# Patient Record
Sex: Male | Born: 1960 | Race: White | Hispanic: No | Marital: Married | Smoking: Current every day smoker
Health system: Southern US, Community
[De-identification: ages and names within clinical notes are randomized; demographics above are authoritative.]

## PROBLEM LIST (undated history)

## (undated) DIAGNOSIS — L509 Urticaria, unspecified: Secondary | ICD-10-CM

## (undated) DIAGNOSIS — R519 Headache, unspecified: Secondary | ICD-10-CM

## (undated) DIAGNOSIS — I480 Paroxysmal atrial fibrillation: Secondary | ICD-10-CM

## (undated) DIAGNOSIS — L97329 Non-pressure chronic ulcer of left ankle with unspecified severity: Secondary | ICD-10-CM

## (undated) DIAGNOSIS — T783XXA Angioneurotic edema, initial encounter: Secondary | ICD-10-CM

## (undated) DIAGNOSIS — E119 Type 2 diabetes mellitus without complications: Secondary | ICD-10-CM

## (undated) DIAGNOSIS — R51 Headache: Secondary | ICD-10-CM

## (undated) DIAGNOSIS — F32A Depression, unspecified: Secondary | ICD-10-CM

## (undated) DIAGNOSIS — E785 Hyperlipidemia, unspecified: Secondary | ICD-10-CM

## (undated) DIAGNOSIS — K219 Gastro-esophageal reflux disease without esophagitis: Secondary | ICD-10-CM

## (undated) DIAGNOSIS — G2581 Restless legs syndrome: Secondary | ICD-10-CM

## (undated) DIAGNOSIS — F419 Anxiety disorder, unspecified: Secondary | ICD-10-CM

## (undated) DIAGNOSIS — Z720 Tobacco use: Secondary | ICD-10-CM

## (undated) DIAGNOSIS — F329 Major depressive disorder, single episode, unspecified: Secondary | ICD-10-CM

## (undated) DIAGNOSIS — Z87442 Personal history of urinary calculi: Secondary | ICD-10-CM

## (undated) DIAGNOSIS — I10 Essential (primary) hypertension: Secondary | ICD-10-CM

## (undated) HISTORY — DX: Tobacco use: Z72.0

## (undated) HISTORY — DX: Paroxysmal atrial fibrillation: I48.0

## (undated) HISTORY — DX: Restless legs syndrome: G25.81

## (undated) HISTORY — DX: Urticaria, unspecified: L50.9

## (undated) HISTORY — DX: Angioneurotic edema, initial encounter: T78.3XXA

## (undated) HISTORY — DX: Hyperlipidemia, unspecified: E78.5

## (undated) HISTORY — DX: Type 2 diabetes mellitus without complications: E11.9

## (undated) HISTORY — DX: Anxiety disorder, unspecified: F41.9

## (undated) HISTORY — DX: Morbid (severe) obesity due to excess calories: E66.01

## (undated) HISTORY — DX: Essential (primary) hypertension: I10

## (undated) SURGERY — Surgical Case
Anesthesia: *Unknown

---
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
## 1992-05-30 HISTORY — PX: VASECTOMY: SHX75

## 2002-05-30 DIAGNOSIS — N200 Calculus of kidney: Secondary | ICD-10-CM | POA: Insufficient documentation

## 2002-05-30 HISTORY — PX: OTHER SURGICAL HISTORY: SHX169

## 2005-11-23 ENCOUNTER — Ambulatory Visit: Payer: Self-pay | Admitting: Family Medicine

## 2005-11-27 ENCOUNTER — Encounter (INDEPENDENT_AMBULATORY_CARE_PROVIDER_SITE_OTHER): Payer: Self-pay | Admitting: Internal Medicine

## 2005-11-27 DIAGNOSIS — E119 Type 2 diabetes mellitus without complications: Secondary | ICD-10-CM | POA: Insufficient documentation

## 2005-12-23 ENCOUNTER — Ambulatory Visit: Payer: Self-pay | Admitting: Family Medicine

## 2006-01-20 ENCOUNTER — Ambulatory Visit: Payer: Self-pay | Admitting: Family Medicine

## 2006-02-24 ENCOUNTER — Ambulatory Visit: Payer: Self-pay | Admitting: Family Medicine

## 2006-04-28 ENCOUNTER — Ambulatory Visit: Payer: Self-pay | Admitting: Family Medicine

## 2006-06-02 ENCOUNTER — Ambulatory Visit: Payer: Self-pay | Admitting: Family Medicine

## 2006-08-11 ENCOUNTER — Ambulatory Visit: Payer: Self-pay | Admitting: Family Medicine

## 2006-09-29 ENCOUNTER — Encounter (INDEPENDENT_AMBULATORY_CARE_PROVIDER_SITE_OTHER): Payer: Self-pay | Admitting: Internal Medicine

## 2006-09-29 DIAGNOSIS — F411 Generalized anxiety disorder: Secondary | ICD-10-CM | POA: Insufficient documentation

## 2006-09-29 DIAGNOSIS — I10 Essential (primary) hypertension: Secondary | ICD-10-CM | POA: Insufficient documentation

## 2006-09-29 DIAGNOSIS — Z6841 Body Mass Index (BMI) 40.0 and over, adult: Secondary | ICD-10-CM

## 2006-10-13 ENCOUNTER — Encounter (INDEPENDENT_AMBULATORY_CARE_PROVIDER_SITE_OTHER): Payer: Self-pay | Admitting: Internal Medicine

## 2006-10-13 ENCOUNTER — Ambulatory Visit: Payer: Self-pay | Admitting: Family Medicine

## 2006-10-13 DIAGNOSIS — M79609 Pain in unspecified limb: Secondary | ICD-10-CM | POA: Insufficient documentation

## 2006-10-16 LAB — CONVERTED CEMR LAB: Hgb A1c MFr Bld: 5.8 % (ref 4.6–6.1)

## 2006-12-06 ENCOUNTER — Telehealth (INDEPENDENT_AMBULATORY_CARE_PROVIDER_SITE_OTHER): Payer: Self-pay | Admitting: *Deleted

## 2006-12-15 ENCOUNTER — Ambulatory Visit: Payer: Self-pay | Admitting: Internal Medicine

## 2007-01-30 ENCOUNTER — Telehealth (INDEPENDENT_AMBULATORY_CARE_PROVIDER_SITE_OTHER): Payer: Self-pay | Admitting: *Deleted

## 2007-07-27 ENCOUNTER — Ambulatory Visit: Payer: Self-pay | Admitting: Family Medicine

## 2007-07-27 ENCOUNTER — Encounter (INDEPENDENT_AMBULATORY_CARE_PROVIDER_SITE_OTHER): Payer: Self-pay | Admitting: Internal Medicine

## 2007-07-27 DIAGNOSIS — F17200 Nicotine dependence, unspecified, uncomplicated: Secondary | ICD-10-CM | POA: Insufficient documentation

## 2007-07-31 LAB — CONVERTED CEMR LAB
BUN: 21 mg/dL
CO2: 24 meq/L
Calcium: 9.5 mg/dL
Chloride: 107 meq/L
Creatinine, Ser: 0.95 mg/dL
Creatinine, Urine: 67 mg/dL
Glucose, Bld: 98 mg/dL
Hgb A1c MFr Bld: 5.8 %
Microalb Creat Ratio: 42.1 mg/g — ABNORMAL HIGH
Microalb, Ur: 2.82 mg/dL — ABNORMAL HIGH
Potassium: 4.2 meq/L
Sodium: 142 meq/L
TSH: 1.022 u[IU]/mL

## 2007-12-28 ENCOUNTER — Ambulatory Visit: Payer: Self-pay | Admitting: Family Medicine

## 2008-01-02 ENCOUNTER — Encounter (INDEPENDENT_AMBULATORY_CARE_PROVIDER_SITE_OTHER): Payer: Self-pay | Admitting: Internal Medicine

## 2008-01-14 ENCOUNTER — Encounter (INDEPENDENT_AMBULATORY_CARE_PROVIDER_SITE_OTHER): Payer: Self-pay | Admitting: Internal Medicine

## 2008-02-27 ENCOUNTER — Encounter (INDEPENDENT_AMBULATORY_CARE_PROVIDER_SITE_OTHER): Payer: Self-pay | Admitting: Internal Medicine

## 2008-05-02 ENCOUNTER — Ambulatory Visit: Payer: Self-pay | Admitting: Internal Medicine

## 2008-07-28 ENCOUNTER — Encounter (INDEPENDENT_AMBULATORY_CARE_PROVIDER_SITE_OTHER): Payer: Self-pay | Admitting: Internal Medicine

## 2008-08-01 ENCOUNTER — Ambulatory Visit: Payer: Self-pay | Admitting: Internal Medicine

## 2008-08-15 ENCOUNTER — Ambulatory Visit: Payer: Self-pay | Admitting: Family Medicine

## 2008-09-30 ENCOUNTER — Ambulatory Visit: Payer: Self-pay | Admitting: Family Medicine

## 2008-11-11 ENCOUNTER — Encounter: Payer: Self-pay | Admitting: Family Medicine

## 2008-11-18 ENCOUNTER — Telehealth (INDEPENDENT_AMBULATORY_CARE_PROVIDER_SITE_OTHER): Payer: Self-pay | Admitting: Internal Medicine

## 2008-11-25 ENCOUNTER — Encounter: Payer: Self-pay | Admitting: Family Medicine

## 2008-11-26 ENCOUNTER — Encounter (INDEPENDENT_AMBULATORY_CARE_PROVIDER_SITE_OTHER): Payer: Self-pay | Admitting: *Deleted

## 2008-11-26 LAB — CONVERTED CEMR LAB
Glucose, Bld: 106 mg/dL
Hgb A1c MFr Bld: 6.2 %

## 2009-01-16 ENCOUNTER — Ambulatory Visit: Payer: Self-pay | Admitting: Family Medicine

## 2009-01-16 DIAGNOSIS — E1169 Type 2 diabetes mellitus with other specified complication: Secondary | ICD-10-CM | POA: Insufficient documentation

## 2009-01-16 DIAGNOSIS — E785 Hyperlipidemia, unspecified: Secondary | ICD-10-CM | POA: Insufficient documentation

## 2009-01-16 LAB — HM DIABETES FOOT EXAM

## 2009-02-16 ENCOUNTER — Telehealth: Payer: Self-pay | Admitting: Family Medicine

## 2009-04-06 ENCOUNTER — Encounter: Payer: Self-pay | Admitting: Family Medicine

## 2009-04-07 ENCOUNTER — Encounter (INDEPENDENT_AMBULATORY_CARE_PROVIDER_SITE_OTHER): Payer: Self-pay | Admitting: *Deleted

## 2009-11-05 ENCOUNTER — Ambulatory Visit: Payer: Self-pay | Admitting: Family Medicine

## 2010-04-06 ENCOUNTER — Encounter: Payer: Self-pay | Admitting: Family Medicine

## 2010-04-08 ENCOUNTER — Ambulatory Visit: Payer: Self-pay | Admitting: Family Medicine

## 2010-04-09 ENCOUNTER — Encounter (INDEPENDENT_AMBULATORY_CARE_PROVIDER_SITE_OTHER): Payer: Self-pay | Admitting: *Deleted

## 2010-04-09 LAB — CONVERTED CEMR LAB
BUN: 17 mg/dL
CO2: 24 meq/L
Chloride: 100 meq/L
GFR calc Af Amer: 121 mL/min
Sodium: 139 meq/L

## 2010-06-29 NOTE — Miscellaneous (Signed)
   Clinical Lists Changes  Observations: Added new observation of HGBA1C: 7.2 % (04/09/2010 12:29) Added new observation of BG RANDOM: 155 mg/dL (16/02/9603 54:09) Added new observation of PSA: 0.3 ng/mL (04/09/2010 12:29) Added new observation of GFRAA: 121 mL/min (04/09/2010 12:29) Added new observation of GFR: 105 mL/min (04/09/2010 12:29) Added new observation of CREATININE: 0.80 mg/dL (81/19/1478 29:56) Added new observation of BUN: 17 mg/dL (21/30/8657 84:69) Added new observation of CO2 PLSM/SER: 24 meq/L (04/09/2010 12:29) Added new observation of CL SERUM: 100 meq/L (04/09/2010 12:29) Added new observation of K SERUM: 4.0 meq/L (04/09/2010 12:29) Added new observation of NA: 139 meq/L (04/09/2010 12:29)

## 2010-06-29 NOTE — Assessment & Plan Note (Signed)
Summary: INSURANCE PPW FOR WORK/DLO   Vital Signs:  Patient profile:   50 year old male Height:      73 inches Weight:      398.13 pounds BMI:     52.72 Temp:     98 degrees F oral Pulse rate:   92 / minute Pulse rhythm:   regular BP sitting:   144 / 84  (left arm) Cuff size:   large  Vitals Entered By: Delilah Shan CMA Duncan Dull) (November 05, 2009 8:27 AM) CC: Insurance paperwork from work   History of Present Illness: 50 year old:  HTN, has been a little elevated. Taking his meds  DM: last a1c = 6.5  never really checks sugar. Has been normal as of a few weeks ago  Continues to gain weight  Chol at goal  Allergies: 1)  * Penicillin  Past History:  Past medical, surgical, family and social histories (including risk factors) reviewed, and no changes noted (except as noted below).  Past Medical History: Reviewed history from 01/16/2009 and no changes required. Anxiety Diabetes mellitus, type II Hypertension Morbid obesity Hyperlipidemia  Past Surgical History: Reviewed history from 09/29/2006 and no changes required. 2004 removal of renal stone surgically  Family History: Reviewed history from 08/01/2008 and no changes required. Father:  Mother: died at age 61---multiple CVA, leukemia, pneumonia Siblings:   .others  Social History: Reviewed history from 07/27/2007 and no changes required. Marital Status: Married Children: 3 Occupation: Chief Executive Officer  Review of Systems       REVIEW OF SYSTEMS GEN: No acute illnesses, no fever, chills, sweats. CV: No chest pain or SOB GI: No noted N or V Otherwise, pertinent positives and negatives are noted in the HPI.   Physical Exam  General:  alert, well-developed, well-nourished, and well-hydrated.  Morbidly obese. Head:  Normocephalic and atraumatic without obvious abnormalities. No apparent alopecia or balding. Ears:  no external deformities.   Nose:  no external deformity.   Lungs:   Normal respiratory effort, chest expands symmetrically. Lungs are clear to auscultation, no crackles or wheezes. Heart:  Normal rate and regular rhythm. S1 and S2 normal without gallop, murmur, click, rub or other extra sounds. Extremities:  No clubbing, cyanosis, edema, or deformity noted with normal full range of motion of all joints.   Neurologic:  alert & oriented X3 and gait normal.   Cervical Nodes:  No lymphadenopathy noted Psych:  Cognition and judgment appear intact. Alert and cooperative with normal attention span and concentration. No apparent delusions, illusions, hallucinations   Impression & Recommendations:  Problem # 1:  HYPERTENSION (ICD-401.9) Assessment Deteriorated  The following medications were removed from the medication list:    Lisinopril-hydrochlorothiazide 20-25 Mg Tabs (Lisinopril-hydrochlorothiazide) .Marland Kitchen... 1 by mouth daily His updated medication list for this problem includes:    Lisinopril-hydrochlorothiazide 20-12.5 Mg Tabs (Lisinopril-hydrochlorothiazide) .Marland Kitchen... 2 by mouth daily  BP today: 144/84 Prior BP: 160/80 (01/16/2009)  Prior 10 Yr Risk Heart Disease: Not enough information (01/16/2009)  Labs Reviewed: K+: 4.2 (07/27/2007) Creat: : 0.95 (07/27/2007)     Problem # 2:  AODM (ICD-250.00) Assessment: Improved  The following medications were removed from the medication list:    Lisinopril-hydrochlorothiazide 20-25 Mg Tabs (Lisinopril-hydrochlorothiazide) .Marland Kitchen... 1 by mouth daily His updated medication list for this problem includes:    Lisinopril-hydrochlorothiazide 20-12.5 Mg Tabs (Lisinopril-hydrochlorothiazide) .Marland Kitchen... 2 by mouth daily  Problem # 3:  HYPERLIPIDEMIA (ICD-272.4) Assessment: Improved  His updated medication list for this problem includes:  Simvastatin 40 Mg Tabs (Simvastatin) .Marland Kitchen... 1 once daily for elevated cholesterol by mouth  Problem # 4:  TOBACCO ABUSE (ICD-305.1) The patient does smoke cigarettes, and we discussed that  tobacco is harmful to one's overall health, and that likely quitting smoking would be the single best thing that they could do for their health.   Complete Medication List: 1)  Paxil 20 Mg Tabs (Paroxetine hcl) .Marland Kitchen.. 1 once daily 2)  Simvastatin 40 Mg Tabs (Simvastatin) .Marland Kitchen.. 1 once daily for elevated cholesterol by mouth 3)  Multivitamins Tabs (Multiple vitamin) .... Take 1 tablet by mouth once a day 4)  Fish Oil Oil (Fish oil) .... Take 1 tablet by mouth once a day 5)  Lisinopril-hydrochlorothiazide 20-12.5 Mg Tabs (Lisinopril-hydrochlorothiazide) .... 2 by mouth daily Prescriptions: LISINOPRIL-HYDROCHLOROTHIAZIDE 20-12.5 MG TABS (LISINOPRIL-HYDROCHLOROTHIAZIDE) 2 by mouth daily  #180 x 3   Entered and Authorized by:   Hannah Beat MD   Signed by:   Hannah Beat MD on 11/05/2009   Method used:   Print then Give to Patient   RxID:   [id number]   Current Allergies (reviewed today): * PENICILLIN

## 2010-06-29 NOTE — Assessment & Plan Note (Signed)
Summary: F/U/CLE   Vital Signs:  Patient profile:   50 year old male Height:      73 inches Weight:      402.12 pounds BMI:     53.25 Temp:     97.7 degrees F oral Pulse rate:   92 / minute Pulse rhythm:   regular BP sitting:   150 / 80  (left arm) Cuff size:   large  Vitals Entered By: Benny Lennert CMA Duncan Dull) (April 08, 2010 3:49 PM)  History of Present Illness: Chief complaint follow up   For about a month, has not been able to talk.  Sinus, ears, throat.  went through his whole family. bad congestion, cough Happpened when on vacation.  Eyes and ears ears itching and uncomfortable.  worried about prostate - prior vasectomy  Has some tinnitus.  Makes a noicse in his ears. longstanding  DM: a1c = 7.2  continues to gain weight  morbid obesity continues to worsen  BP not at goal.    Allergies: 1)  * Penicillin  Past History:  Past medical, surgical, family and social histories (including risk factors) reviewed, and no changes noted (except as noted below).  Past Medical History: Reviewed history from 01/16/2009 and no changes required. Anxiety Diabetes mellitus, type II Hypertension Morbid obesity Hyperlipidemia  Past Surgical History: Reviewed history from 09/29/2006 and no changes required. 2004 removal of renal stone surgically  Family History: Reviewed history from 08/01/2008 and no changes required. Father:  Mother: died at age 6---multiple CVA, leukemia, pneumonia Siblings:   .others  Social History: Reviewed history from 07/27/2007 and no changes required. Marital Status: Married Children: 3 Occupation: Chief Executive Officer  Review of Systems       REVIEW OF SYSTEMS GEN: Acute illness details above. CV: No chest pain or SOB GI: No noted N or V Otherwise, pertinent positives and negatives are noted in the HPI.   Physical Exam  Additional Exam:  GEN: WDWN, NAD, Non-toxic, A & O x 3 HEENT: Atraumatic,  Normocephalic. Neck supple. No masses, No LAD. Ears and Nose: No external deformity. serous fluid. CV: RRR, No M/G/R. No JVD. No thrill. No extra heart sounds. PULM: CTA B, decreased BS. no wheezes, crackles, rhonchi. No retractions. No resp. distress. No accessory muscle use. ABD: S, NT, ND, +BS. No rebound tenderness. No HSM.  EXTR: No c/c/e NEURO: Normal gait.  PSYCH: Normally interactive. Conversant. Not depressed or anxious appearing.  Calm demeanor.     Impression & Recommendations:  Problem # 1:  BRONCHITIS- ACUTE (ICD-466.0) Assessment New  His updated medication list for this problem includes:    Azithromycin 250 Mg Tabs (Azithromycin) .Marland Kitchen... 2 by  mouth today and then 1 daily for 4 days  Problem # 2:  AODM (ICD-250.00) Assessment: Unchanged  His updated medication list for this problem includes:    Lisinopril-hydrochlorothiazide 20-12.5 Mg Tabs (Lisinopril-hydrochlorothiazide) .Marland Kitchen... 2 by mouth daily  Problem # 3:  HYPERTENSION (ICD-401.9) ensure that on 40/25, proper dosing.   His updated medication list for this problem includes:    Lisinopril-hydrochlorothiazide 20-12.5 Mg Tabs (Lisinopril-hydrochlorothiazide) .Marland Kitchen... 2 by mouth daily  Problem # 4:  OBESITY, MORBID (ICD-278.01)  Problem # 5:  TOBACCO ABUSE (ICD-305.1)  The patient does smoke cigarettes, and we discussed that tobacco is harmful to one's overall health, and that likely quitting smoking would be the single best thing that they could do for their health.   Orders: Tobacco use cessation intermediate 3-10 minutes (81191)  Problem #  6:  HYPERLIPIDEMIA (ICD-272.4)  His updated medication list for this problem includes:    Simvastatin 40 Mg Tabs (Simvastatin) .Marland Kitchen... 1 once daily for elevated cholesterol by mouth  Complete Medication List: 1)  Paxil 20 Mg Tabs (Paroxetine hcl) .Marland Kitchen.. 1 once daily 2)  Simvastatin 40 Mg Tabs (Simvastatin) .Marland Kitchen.. 1 once daily for elevated cholesterol by mouth 3)  Multivitamins  Tabs (Multiple vitamin) .... Take 1 tablet by mouth once a day 4)  Fish Oil Oil (Fish oil) .... Take 1 tablet by mouth once a day 5)  Lisinopril-hydrochlorothiazide 20-12.5 Mg Tabs (Lisinopril-hydrochlorothiazide) .... 2 by mouth daily 6)  Glucosamine-chondroitin 250-200 Mg Caps (Glucosamine-chondroitin) .... One tablet daily 7)  Azithromycin 250 Mg Tabs (Azithromycin) .... 2 by  mouth today and then 1 daily for 4 days Prescriptions: SIMVASTATIN 40 MG  TABS (SIMVASTATIN) 1 once daily for elevated cholesterol by mouth  #90 x 3   Entered and Authorized by:   Hannah Beat MD   Signed by:   Hannah Beat MD on 04/08/2010   Method used:   Electronically to        [facility] [address] (retail)       [address]       [place], [place]  [address]       Ph: [phone]       Fax: [phone]   RxID:   [id number] LISINOPRIL-HYDROCHLOROTHIAZIDE 20-12.5 MG TABS (LISINOPRIL-HYDROCHLOROTHIAZIDE) 2 by mouth daily  #180 x 3   Entered and Authorized by:   Hannah Beat MD   Signed by:   Hannah Beat MD on 04/08/2010   Method used:   Electronically to        [facility] [address] (retail)       [address]       [place], [place]  [address]       Ph: [phone]       Fax: [phone]   RxID:   [id number] AZITHROMYCIN 250 MG  TABS (AZITHROMYCIN) 2 by  mouth today and then 1 daily for 4 days  #6 x 0   Entered and Authorized by:   Hannah Beat MD   Signed by:   Hannah Beat MD on 04/08/2010   Method used:   Print then Give to Patient   RxID:   [id number]    Orders Added: 1)  Est. Patient Level IV [32355] 2)  Tobacco use cessation intermediate 3-10 minutes [99406]    Current Allergies (reviewed today): * PENICILLIN

## 2010-07-21 ENCOUNTER — Encounter: Payer: Self-pay | Admitting: Family Medicine

## 2010-07-21 DIAGNOSIS — E119 Type 2 diabetes mellitus without complications: Secondary | ICD-10-CM

## 2010-07-21 DIAGNOSIS — E785 Hyperlipidemia, unspecified: Secondary | ICD-10-CM | POA: Insufficient documentation

## 2010-07-21 DIAGNOSIS — F411 Generalized anxiety disorder: Secondary | ICD-10-CM | POA: Insufficient documentation

## 2010-07-21 DIAGNOSIS — E11622 Type 2 diabetes mellitus with other skin ulcer: Secondary | ICD-10-CM | POA: Insufficient documentation

## 2010-07-21 DIAGNOSIS — E1159 Type 2 diabetes mellitus with other circulatory complications: Secondary | ICD-10-CM | POA: Insufficient documentation

## 2010-07-21 DIAGNOSIS — F419 Anxiety disorder, unspecified: Secondary | ICD-10-CM

## 2010-07-21 DIAGNOSIS — I10 Essential (primary) hypertension: Secondary | ICD-10-CM

## 2010-09-09 ENCOUNTER — Ambulatory Visit: Payer: Self-pay | Admitting: Family Medicine

## 2010-09-24 ENCOUNTER — Other Ambulatory Visit: Payer: Self-pay | Admitting: *Deleted

## 2010-09-24 MED ORDER — PAROXETINE HCL 20 MG PO TABS: 20.0000 mg | ORAL_TABLET | ORAL | Status: DC

## 2010-09-30 ENCOUNTER — Ambulatory Visit: Payer: Self-pay | Admitting: Family Medicine

## 2010-10-12 ENCOUNTER — Telehealth: Payer: Self-pay | Admitting: *Deleted

## 2010-10-12 NOTE — Telephone Encounter (Signed)
Pt has follow up appt with you for 5/24.  He is asking for order to have labs prior, he works at [facility] and will pick up order when ready.

## 2010-10-13 NOTE — Telephone Encounter (Signed)
done

## 2010-10-21 ENCOUNTER — Ambulatory Visit (INDEPENDENT_AMBULATORY_CARE_PROVIDER_SITE_OTHER): Payer: BC Managed Care – PPO | Admitting: Family Medicine

## 2010-10-21 ENCOUNTER — Encounter: Payer: Self-pay | Admitting: Family Medicine

## 2010-10-21 VITALS — BP 120/84 | HR 103 | Temp 98.1°F | Ht 72.0 in | Wt 359.0 lb

## 2010-10-21 DIAGNOSIS — F17200 Nicotine dependence, unspecified, uncomplicated: Secondary | ICD-10-CM

## 2010-10-21 DIAGNOSIS — I10 Essential (primary) hypertension: Secondary | ICD-10-CM

## 2010-10-21 DIAGNOSIS — E119 Type 2 diabetes mellitus without complications: Secondary | ICD-10-CM

## 2010-10-21 DIAGNOSIS — E785 Hyperlipidemia, unspecified: Secondary | ICD-10-CM

## 2010-10-21 MED ORDER — METFORMIN HCL ER 500 MG PO TB24: ORAL_TABLET | ORAL | Status: DC

## 2010-10-21 NOTE — Patient Instructions (Signed)
Metformin Titration:  1 po each morning for 2 weeks Then 2 tabs each morning for 2 weeks Then 3 tabs each morning   Occaisionally, this can upset the stomach or cause nausea. Almost universally, this will go away over time within a few weeks. Each time you go up in the dose, that can happen.  Check your blood sugar twice a day: write down the numbers Fasting each morning Then once a day, 2 hours after eating  Recheck with me in 1 month  BRING IN THE BLOOD SUGAR NUMBERS

## 2010-10-21 NOTE — Progress Notes (Signed)
50 year old male: Lost 50 pounds.  DM: rapidly deteriorating from effectively no diabetes, though he carried that diagnosis, previously he had an A1c that was within normal limits. On his most recent check, the patient had a hemoglobin A1c that was 12. His fasting blood sugar was greater than 370. He has been urinating approximately every 30 minutes. He has also had bouts of nausea over the last 1-2 months. Denies any blurred vision. Generally, and overall, he has not felt well. He has a 50 pound weight loss. He has been trying to eat well, and has discussed his diabetes with his wife, who is an Char[employer] fundraiser.  HTN: better compliance with medication, and 50 pound weight loss. No side effects.  Lipids: LDL is less than 30. Total cholesterol is at goal as well. Triglycerides are elevated greater than 300. Tolerating statin currently.  Was sick with a stomach bug Eating about half eating out and spacing out to about six meals a day. If not hungry, he has not been eating.   He is down to 350 pounds, less than greater than 400.  He continues to smoke, down to half a pack per or so a day, which is decreased down to and from 2-1/2 packs a day a few years ago  Patient Active Problem List  Diagnoses  . HYPERLIPIDEMIA  . OBESITY, MORBID  . TOBACCO ABUSE  . HYPERTENSION  . CALCULUS, KIDNEY  . Anxiety  . Diabetes mellitus type II   Past Medical History  Diagnosis Date  . Anxiety   . Diabetes mellitus type II   . HTN (hypertension)   . Morbid obesity   . HLD (hyperlipidemia)   . Tobacco abuse    Past Surgical History  Procedure Date  . Kidney stone removal 2004   History  Substance Use Topics  . Smoking status: Current Everyday Smoker -- 1.0 packs/day for 75 years    Types: Cigarettes  . Smokeless tobacco: Not on file  . Alcohol Use: Not on file   Family History  Problem Relation Age of Onset  . Stroke Mother     multiple  . Leukemia Mother   . Pneumonia Mother    Allergies  Allergen  Reactions  . Penicillins     REACTION: unspecified   Current Outpatient Prescriptions on File Prior to Visit  Medication Sig Dispense Refill  . fish oil-omega-3 fatty acids 1000 MG capsule Take 1 g by mouth daily.        Marland Kitchen glucosamine-chondroitin 500-400 MG tablet Take 1 tablet by mouth daily.        Marland Kitchen lisinopril-hydrochlorothiazide (PRINZIDE,ZESTORETIC) 20-12.5 MG per tablet Take 2 tablets by mouth daily.        . Multiple Vitamin (MULTIVITAMIN) tablet Take 1 tablet by mouth daily.        Marland Kitchen PARoxetine (PAXIL) 20 MG tablet Take 1 tablet (20 mg total) by mouth every morning.  30 tablet  6  . simvastatin (ZOCOR) 40 MG tablet Take 40 mg by mouth at bedtime.         ROS: GEN: no fevers, chills. GI: as above Pulm: No SOB Sad about DM  Interactive and getting along well at home.  Otherwise, ROS is as per the HPI.   Physical Exam  Blood pressure 120/84, pulse 103, temperature 98.1 F (36.7 C), temperature source Oral, height 6' (1.829 m), weight 359 lb (162.841 kg), SpO2 97.00%.  GEN: WDWN, NAD, Non-toxic, A & O x 3 HEENT: Atraumatic, Normocephalic. Neck supple.  No masses, No LAD. Ears and Nose: No external deformity. CV: RRR, No M/G/R. No JVD. No thrill. No extra heart sounds. PULM: CTA B, no wheezes, crackles, rhonchi. No retractions. No resp. distress. No accessory muscle use. ABD: S, NT, ND, +BS. No rebound tenderness. No HSM.  EXTR: No c/c/e NEURO Normal gait.  PSYCH: Normally interactive. Conversant. Not depressed or anxious appearing.  Calm demeanor.

## 2010-10-22 ENCOUNTER — Encounter: Payer: Self-pay | Admitting: Family Medicine

## 2010-10-27 ENCOUNTER — Encounter: Payer: Self-pay | Admitting: Family Medicine

## 2010-10-28 ENCOUNTER — Ambulatory Visit: Payer: Self-pay | Admitting: Family Medicine

## 2010-11-05 ENCOUNTER — Other Ambulatory Visit: Payer: Self-pay | Admitting: *Deleted

## 2010-11-05 MED ORDER — GLUCOSE BLOOD VI STRP: ORAL_STRIP | Status: DC

## 2010-11-16 ENCOUNTER — Other Ambulatory Visit: Payer: Self-pay | Admitting: Family Medicine

## 2010-11-25 ENCOUNTER — Encounter: Payer: Self-pay | Admitting: Family Medicine

## 2010-11-25 ENCOUNTER — Ambulatory Visit (INDEPENDENT_AMBULATORY_CARE_PROVIDER_SITE_OTHER): Payer: BC Managed Care – PPO | Admitting: Family Medicine

## 2010-11-25 VITALS — BP 120/70 | HR 86 | Temp 98.1°F | Ht 72.0 in | Wt 358.8 lb

## 2010-11-25 DIAGNOSIS — Z23 Encounter for immunization: Secondary | ICD-10-CM

## 2010-11-25 DIAGNOSIS — F17200 Nicotine dependence, unspecified, uncomplicated: Secondary | ICD-10-CM

## 2010-11-25 DIAGNOSIS — E119 Type 2 diabetes mellitus without complications: Secondary | ICD-10-CM

## 2010-11-25 DIAGNOSIS — I10 Essential (primary) hypertension: Secondary | ICD-10-CM

## 2010-11-25 DIAGNOSIS — E785 Hyperlipidemia, unspecified: Secondary | ICD-10-CM

## 2010-11-25 MED ORDER — METFORMIN HCL ER 500 MG PO TB24: 2000.0000 mg | ORAL_TABLET | Freq: Every day | ORAL | Status: DC

## 2010-11-25 NOTE — Progress Notes (Signed)
[name] [name], a 50 y.o. male presents today in the office for the following:   DM: blood sugars are trending downward, still in the high 100s at this point. No side effects from the medication. He feels dramatically better. No urination, nausea. He is checking his blood sugars at home.  HTN at goal: tolerating all medications well  Feels better than has for the last six months.  Tol metformin  Watchful of what he is eating.   Eye MD: [place], a year Pneumovax: do today  Hyperlipidemia: All laboratory numbers reviewed with the patient. From an LDL standpoint, he is at goal. He does have some high triglycerides.  Tobacco abuse, dramatically decreased compared to years prior, now only smoking [place]el cigarettes a day.  Morbid obesity, 50 pound weight loss, but the patient still remains 350 pounds.  Patient Active Problem List  Diagnoses  . HYPERLIPIDEMIA  . OBESITY, MORBID  . TOBACCO ABUSE  . HYPERTENSION  . CALCULUS, KIDNEY  . Anxiety  . Diabetes mellitus type II   Past Medical History  Diagnosis Date  . Anxiety   . Diabetes mellitus type II   . HTN (hypertension)   . Morbid obesity   . HLD (hyperlipidemia)   . Tobacco abuse    Past Surgical History  Procedure Date  . Kidney stone removal 2004   History  Substance Use Topics  . Smoking status: Current Everyday Smoker -- 1.0 packs/day for 75 years    Types: Cigarettes  . Smokeless tobacco: Not on file  . Alcohol Use: Not on file   Family History  Problem Relation Age of Onset  . Stroke Mother     multiple  . Leukemia Mother   . Pneumonia Mother    Allergies  Allergen Reactions  . Penicillins     REACTION: unspecified   Current Outpatient Prescriptions on File Prior to Visit  Medication Sig Dispense Refill  . aspirin 81 MG tablet Take 81 mg by mouth daily.        . fish oil-omega-3 fatty acids 1000 MG capsule Take 1 g by mouth daily.        Marland Kitchen glucosamine-chondroitin 500-400 MG tablet Take 1 tablet by  mouth daily.        Marland Kitchen glucose blood (TRUETEST TEST) test strip Use as instructed  100 each  12  . lisinopril-hydrochlorothiazide (PRINZIDE,ZESTORETIC) 20-12.5 MG per tablet TAKE 2 TABLETS BY MOUTH ONCE A DAY  60 tablet  1  . Multiple Vitamin (MULTIVITAMIN) tablet Take 1 tablet by mouth daily.        Marland Kitchen PARoxetine (PAXIL) 20 MG tablet Take 1 tablet (20 mg total) by mouth every morning.  30 tablet  6  . simvastatin (ZOCOR) 40 MG tablet Take 40 mg by mouth at bedtime.          ROS: GEN: No acute illnesses, no fevers, chills. GI: No n/v/d, eating normally Pulm: No SOB Interactive and getting along well at home.  Otherwise, ROS is as per the HPI.   Physical Exam  Blood pressure 120/70, pulse 86, temperature 98.1 F (36.7 C), temperature source Oral, height 6' (1.829 m), weight 358 lb 12.8 oz (162.751 kg), SpO2 98.00%.  GEN: WDWN, NAD, Non-toxic, A & O x 3 HEENT: Atraumatic, Normocephalic. Neck supple. No masses, No LAD. Ears and Nose: No external deformity. CV: RRR, No M/G/R. No JVD. No thrill. No extra heart sounds. PULM: CTA B, no wheezes, crackles, rhonchi. No retractions. No resp. distress. No accessory muscle use.  EXTR: No c/c/e NEURO Normal gait.  PSYCH: Normally interactive. Conversant. Not depressed or anxious appearing.  Calm demeanor.

## 2010-11-25 NOTE — Patient Instructions (Signed)
REFERRAL: GO THE THE FRONT ROOM AT THE ENTRANCE OF OUR CLINIC, NEAR CHECK IN. ASK FOR MARION. SHE WILL HELP YOU SET UP YOUR REFERRAL. DATE: TIME:  INCREASE YOUR METFORMIN TO 4 TABLETS BEFORE BREAKFAST  F/u 2 months

## 2011-01-14 ENCOUNTER — Other Ambulatory Visit: Payer: Self-pay | Admitting: Family Medicine

## 2011-01-19 ENCOUNTER — Ambulatory Visit: Payer: BC Managed Care – PPO | Admitting: Family Medicine

## 2011-02-08 ENCOUNTER — Ambulatory Visit: Payer: BC Managed Care – PPO | Admitting: Family Medicine

## 2011-03-15 ENCOUNTER — Other Ambulatory Visit: Payer: Self-pay | Admitting: Family Medicine

## 2011-04-14 ENCOUNTER — Other Ambulatory Visit: Payer: Self-pay | Admitting: Family Medicine

## 2011-05-14 ENCOUNTER — Other Ambulatory Visit: Payer: Self-pay | Admitting: Family Medicine

## 2011-07-12 ENCOUNTER — Other Ambulatory Visit: Payer: Self-pay | Admitting: Family Medicine

## 2011-08-25 ENCOUNTER — Encounter: Payer: Self-pay | Admitting: Family Medicine

## 2011-09-13 ENCOUNTER — Other Ambulatory Visit: Payer: Self-pay | Admitting: Family Medicine

## 2011-10-14 ENCOUNTER — Other Ambulatory Visit: Payer: Self-pay | Admitting: Family Medicine

## 2011-11-12 ENCOUNTER — Other Ambulatory Visit: Payer: Self-pay | Admitting: Family Medicine

## 2011-12-06 ENCOUNTER — Other Ambulatory Visit: Payer: Self-pay | Admitting: *Deleted

## 2011-12-06 ENCOUNTER — Telehealth: Payer: Self-pay | Admitting: *Deleted

## 2011-12-06 MED ORDER — PAROXETINE HCL 20 MG PO TABS: 20.0000 mg | ORAL_TABLET | ORAL | Status: DC

## 2011-12-06 MED ORDER — SIMVASTATIN 40 MG PO TABS: 40.0000 mg | ORAL_TABLET | Freq: Every day | ORAL | Status: DC

## 2011-12-06 MED ORDER — LISINOPRIL-HYDROCHLOROTHIAZIDE 20-12.5 MG PO TABS: ORAL_TABLET | ORAL | Status: DC

## 2011-12-06 MED ORDER — METFORMIN HCL ER 500 MG PO TB24: ORAL_TABLET | ORAL | Status: DC

## 2011-12-06 NOTE — Telephone Encounter (Signed)
Patient advised 90 supply given but, no further refills until appt for physical

## 2012-03-07 ENCOUNTER — Ambulatory Visit (INDEPENDENT_AMBULATORY_CARE_PROVIDER_SITE_OTHER): Payer: 59 | Admitting: Family Medicine

## 2012-03-07 ENCOUNTER — Encounter: Payer: Self-pay | Admitting: Family Medicine

## 2012-03-07 VITALS — BP 148/78 | HR 92 | Temp 98.3°F | Wt 375.5 lb

## 2012-03-07 DIAGNOSIS — R1909 Other intra-abdominal and pelvic swelling, mass and lump: Secondary | ICD-10-CM

## 2012-03-07 DIAGNOSIS — R1900 Intra-abdominal and pelvic swelling, mass and lump, unspecified site: Secondary | ICD-10-CM

## 2012-03-07 DIAGNOSIS — M19049 Primary osteoarthritis, unspecified hand: Secondary | ICD-10-CM

## 2012-03-07 DIAGNOSIS — E119 Type 2 diabetes mellitus without complications: Secondary | ICD-10-CM

## 2012-03-07 DIAGNOSIS — N644 Mastodynia: Secondary | ICD-10-CM

## 2012-03-07 MED ORDER — LISINOPRIL-HYDROCHLOROTHIAZIDE 20-12.5 MG PO TABS: ORAL_TABLET | ORAL | Status: DC

## 2012-03-07 MED ORDER — PAROXETINE HCL 20 MG PO TABS: 20.0000 mg | ORAL_TABLET | ORAL | Status: DC

## 2012-03-07 MED ORDER — METFORMIN HCL ER 500 MG PO TB24: ORAL_TABLET | ORAL | Status: DC

## 2012-03-07 MED ORDER — GLIPIZIDE ER 5 MG PO TB24: 5.0000 mg | ORAL_TABLET | Freq: Every day | ORAL | Status: DC

## 2012-03-07 MED ORDER — SIMVASTATIN 40 MG PO TABS: 40.0000 mg | ORAL_TABLET | Freq: Every day | ORAL | Status: DC

## 2012-03-07 NOTE — Progress Notes (Signed)
Nature conservation officer at [facility] [address] [place] [address] Phone: [phone] Fax: [phone]  Date:  03/07/2012   Name:  [name] [name]   DOB:  1960/08/14   MRN:  [id number] Gender: male Age: 51 y.o.  PCP:  Hannah Beat, MD    Chief Complaint: Medication Refill   History of Present Illness:  [name] [name] is a 51 y.o. pleasant patient who presents with the following:  Cut down to 1 cigarrette.   DM: CBG 242 and fasting. a1c 8.6 [facility]. 4 tablets of metformin.  Some weight loss - down to 375  Left 5th DIP joint swells and hurts. Taking glucosamine and chondroitin  Glucometer and test strips  All meds with 90 day cycle with [facility]  Something in groin. Has been there several months. Thought maybe a pmple, but it is inside. Not really all that painful, but he can feel it  Left chest. Last 3-4 week an area that he can feel inferior to nipple, some pain with palpation. No fhx of breast cancer.  Patient Active Problem List  Diagnosis  . HYPERLIPIDEMIA  . OBESITY, MORBID  . TOBACCO ABUSE  . HYPERTENSION  . CALCULUS, KIDNEY  . Anxiety  . Diabetes mellitus type II    Past Medical History  Diagnosis Date  . Anxiety   . Diabetes mellitus type II   . HTN (hypertension)   . Morbid obesity   . HLD (hyperlipidemia)   . Tobacco abuse     Past Surgical History  Procedure Date  . Kidney stone removal 2004    History  Substance Use Topics  . Smoking status: Current Every Day Smoker -- 75 years    Types: Cigarettes  . Smokeless tobacco: Never Used   Comment: one cigarette daily  . Alcohol Use: Yes     rarely    Family History  Problem Relation Age of Onset  . Stroke Mother     multiple  . Leukemia Mother   . Pneumonia Mother     Allergies  Allergen Reactions  . Penicillins     REACTION: unspecified    Medication list has been reviewed and updated.  Outpatient Prescriptions Prior to Visit  Medication Sig Dispense  Refill  . aspirin 81 MG tablet Take 81 mg by mouth daily.        . fish oil-omega-3 fatty acids 1000 MG capsule Take 1 g by mouth daily.        Marland Kitchen glucosamine-chondroitin 500-400 MG tablet Take 1 tablet by mouth daily.        Marland Kitchen lisinopril-hydrochlorothiazide (PRINZIDE,ZESTORETIC) 20-12.5 MG per tablet TAKE 2 TABLETS BY MOUTH ONCE A DAY  180 tablet  0  . metFORMIN (GLUCOPHAGE-XR) 500 MG 24 hr tablet TAKE 4 TABLETS BY MOUTH DAILY WITH BREAKFAST  360 tablet  0  . Multiple Vitamin (MULTIVITAMIN) tablet Take 1 tablet by mouth daily.        Marland Kitchen PARoxetine (PAXIL) 20 MG tablet Take 1 tablet (20 mg total) by mouth every morning.  90 tablet  0  . simvastatin (ZOCOR) 40 MG tablet Take 1 tablet (40 mg total) by mouth at bedtime.  90 tablet  0    Review of Systems:  No fever, chills, sweats, depression. Decreased tobacco.  Physical Examination: Filed Vitals:   03/07/12 0820  BP: 148/78  Pulse: 92  Temp: 98.3 F (36.8 C)   Filed Vitals:   03/07/12 0820  Weight: 375 lb 8 oz (170.326 kg)  There is no height on file to calculate BMI. Ideal Body Weight:     GEN: WDWN, NAD, Non-toxic, A & O x 3 HEENT: Atraumatic, Normocephalic. Neck supple. No masses, No LAD. Ears and Nose: No external deformity. CV: RRR, No M/G/R. No JVD. No thrill. No extra heart sounds. PULM: CTA B, no wheezes, crackles, rhonchi. No retractions. No resp. distress. No accessory muscle use. GU: superficial mass felt on L lower groin, does not bulge with valsalva Breast: Left breast with a discreet area that is mildly tender to palpation and freely moveable. EXTR: No c/c/e NEURO Normal gait.  PSYCH: Normally interactive. Conversant. Not depressed or anxious appearing.  Calm demeanor.    Assessment and Plan:  1. Diabetes mellitus, type 2 : add glipizide, recheck 3 mo, glucometer written  2. Breast pain, left : reassured, alleve bid for 3-4 weeks, if continues can reexamine vs image  3. 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 Groin mass : in soft tissue, not c/w hernia    Orders Today:  No orders of the defined types were placed in this encounter.    Updated Medication List: (Includes new medications, updates to list, dose adjustments) Meds ordered this encounter  Medications  . vitamin C (ASCORBIC ACID) 500 MG tablet    Sig: Take 500 mg by mouth daily.  Marland Kitchen ECHINACEA PO    Sig: Take by mouth daily.  Marland Kitchen lisinopril-hydrochlorothiazide (PRINZIDE,ZESTORETIC) 20-12.5 MG per tablet    Sig: TAKE 2 TABLETS BY MOUTH ONCE A DAY    Dispense:  180 tablet    Refill:  3  . metFORMIN (GLUCOPHAGE-XR) 500 MG 24 hr tablet    Sig: TAKE 4 TABLETS BY MOUTH DAILY WITH BREAKFAST    Dispense:  360 tablet    Refill:  3  . PARoxetine (PAXIL) 20 MG tablet    Sig: Take 1 tablet (20 mg total) by mouth every morning.    Dispense:  90 tablet    Refill:  3  . simvastatin (ZOCOR) 40 MG tablet    Sig: Take 1 tablet (40 mg total) by mouth at bedtime.    Dispense:  90 tablet    Refill:  3  . glipiZIDE (GLUCOTROL XL) 5 MG 24 hr tablet    Sig: Take 1 tablet (5 mg total) by mouth daily.    Dispense:  90 tablet    Refill:  3    Medications Discontinued: Medications Discontinued During This Encounter  Medication Reason  . lisinopril-hydrochlorothiazide (PRINZIDE,ZESTORETIC) 20-12.5 MG per tablet Reorder  . metFORMIN (GLUCOPHAGE-XR) 500 MG 24 hr tablet Reorder  . PARoxetine (PAXIL) 20 MG tablet Reorder  . simvastatin (ZOCOR) 40 MG tablet Reorder     Hannah Beat, MD

## 2012-03-07 NOTE — Patient Instructions (Addendum)
F/u 3 months 

## 2012-03-09 ENCOUNTER — Other Ambulatory Visit: Payer: Self-pay | Admitting: *Deleted

## 2012-03-09 MED ORDER — GLUCOSE BLOOD VI STRP: ORAL_STRIP | Status: DC

## 2012-03-09 MED ORDER — BAYER MICROLET LANCETS MISC: Status: DC

## 2012-03-09 NOTE — Telephone Encounter (Signed)
Pt called for mail order rx of his test strips and lancets. He has already purchased his glucometer and needed 90 day rx for specific brand strips and lancets

## 2012-03-20 ENCOUNTER — Telehealth: Payer: Self-pay | Admitting: *Deleted

## 2012-03-20 NOTE — Telephone Encounter (Signed)
Patient calling asking that we call [facility] and request prior auth for his BAYER CONTOUR TEST STRIPS. I asked pt how many times per day he tests and he stated,  because his blood sugar is not under control he tests maybe 6 times daily. I advised that because he's not insulin dependent most insurances will only pay for once daily testing. Pt still want prior auth done and asks for twice daily testing. Please advise.

## 2012-03-21 NOTE — Telephone Encounter (Signed)
Please do so.   He is checking his BS too much. Twice a day with one fasting and one time a day 2 hours after eating is more than enough.   Happy to write for strips

## 2012-03-23 ENCOUNTER — Telehealth: Payer: Self-pay

## 2012-03-23 NOTE — Telephone Encounter (Signed)
Patient notified as instructed by telephone. Pt requested [facility] to fax PA form additional questions.

## 2012-03-23 NOTE — Telephone Encounter (Signed)
Pt had spoken with [facility] on status of diabetic test strips; pt said [facility]  refaxed the PA form it was not complete. Do not find PA. Pt will have [facility] refax.

## 2012-03-26 NOTE — Telephone Encounter (Signed)
Completed PA form faxed to [phone].

## 2012-03-26 NOTE — Telephone Encounter (Addendum)
Additional questions from PA received; pt stated has tried accuchek,glucocard and one touch; meters were not accurate for him; had no confidence in readings; pt is not on insulin pump. PA form in Dr Copland's in box.Pt request call back [phone] when completed form is faxed.

## 2012-03-27 NOTE — Telephone Encounter (Signed)
Received an approval letter from [facility] for pt's test strips. Test strips are approved through 03/26/2022

## 2012-06-06 ENCOUNTER — Ambulatory Visit: Payer: 59 | Admitting: Family Medicine

## 2012-06-21 ENCOUNTER — Ambulatory Visit: Payer: 59 | Admitting: Family Medicine

## 2012-06-21 DIAGNOSIS — Z0289 Encounter for other administrative examinations: Secondary | ICD-10-CM

## 2012-07-28 ENCOUNTER — Other Ambulatory Visit: Payer: Self-pay | Admitting: Family Medicine

## 2012-07-30 NOTE — Telephone Encounter (Signed)
[facility] request refill Bayer contour next test strip #200 x 1.

## 2012-10-08 ENCOUNTER — Other Ambulatory Visit: Payer: Self-pay | Admitting: Family Medicine

## 2012-11-09 ENCOUNTER — Telehealth: Payer: Self-pay | Admitting: Family Medicine

## 2012-11-09 MED ORDER — NONFORMULARY OR COMPOUNDED ITEM: Status: DC

## 2012-11-09 NOTE — Telephone Encounter (Signed)
done

## 2012-11-09 NOTE — Telephone Encounter (Signed)
Pt scheduled appointment for next week, diabetes check and is having leg issues.  He would like to get blood work drawn at [facility] before appointment.   If you would put the order into epic, I will print and mail external orders to [place] today.  Thank you. / lt

## 2012-11-14 ENCOUNTER — Ambulatory Visit: Payer: 59 | Admitting: Family Medicine

## 2012-11-15 ENCOUNTER — Other Ambulatory Visit: Payer: Self-pay | Admitting: Family Medicine

## 2012-11-16 LAB — CBC WITH DIFFERENTIAL
Basos: 1 % (ref 0–3)
Eosinophils Absolute: 0.2 10*3/uL (ref 0.0–0.4)
HCT: 43.8 % (ref 37.5–51.0)
Hemoglobin: 15.9 g/dL (ref 12.6–17.7)
Lymphs: 25 % (ref 14–46)
MCH: 30.1 pg (ref 26.6–33.0)
MCHC: 36.3 g/dL — ABNORMAL HIGH (ref 31.5–35.7)
Neutrophils Absolute: 5.3 10*3/uL (ref 1.4–7.0)

## 2012-11-16 LAB — COMPREHENSIVE METABOLIC PANEL
ALT: 26 IU/L (ref 0–44)
Albumin/Globulin Ratio: 2.1 (ref 1.1–2.5)
Albumin: 4.2 g/dL (ref 3.5–5.5)
Creatinine, Ser: 0.92 mg/dL (ref 0.76–1.27)
GFR calc Af Amer: 111 mL/min/{1.73_m2} (ref >59)
GFR calc non Af Amer: 96 mL/min/{1.73_m2} (ref >59)
Globulin, Total: 2 g/dL (ref 1.5–4.5)
Glucose: 175 mg/dL — ABNORMAL HIGH (ref 65–99)
Potassium: 4.2 mmol/L (ref 3.5–5.2)
Total Bilirubin: 0.4 mg/dL (ref 0.0–1.2)
Total Protein: 6.2 g/dL (ref 6.0–8.5)

## 2012-11-16 LAB — PSA: PSA: 0.2 ng/mL (ref 0.0–4.0)

## 2012-11-16 LAB — HEPATIC FUNCTION PANEL: Bilirubin, Direct: 0.13 mg/dL (ref 0.00–0.40)

## 2012-11-16 LAB — LIPID PANEL W/O CHOL/HDL RATIO
LDL Calculated: 80 mg/dL (ref 0–99)
VLDL Cholesterol Cal: 26 mg/dL (ref 5–40)

## 2012-11-16 LAB — HGB A1C W/O EAG: Hgb A1c MFr Bld: 7.6 % — ABNORMAL HIGH (ref 4.8–5.6)

## 2012-11-19 ENCOUNTER — Ambulatory Visit (INDEPENDENT_AMBULATORY_CARE_PROVIDER_SITE_OTHER): Payer: 59 | Admitting: Family Medicine

## 2012-11-19 ENCOUNTER — Encounter: Payer: Self-pay | Admitting: Family Medicine

## 2012-11-19 VITALS — BP 130/80 | HR 110 | Temp 98.2°F | Ht 72.0 in | Wt 376.0 lb

## 2012-11-19 DIAGNOSIS — F17200 Nicotine dependence, unspecified, uncomplicated: Secondary | ICD-10-CM

## 2012-11-19 DIAGNOSIS — E119 Type 2 diabetes mellitus without complications: Secondary | ICD-10-CM

## 2012-11-19 DIAGNOSIS — I10 Essential (primary) hypertension: Secondary | ICD-10-CM

## 2012-11-19 DIAGNOSIS — G2581 Restless legs syndrome: Secondary | ICD-10-CM

## 2012-11-19 DIAGNOSIS — L97209 Non-pressure chronic ulcer of unspecified calf with unspecified severity: Secondary | ICD-10-CM

## 2012-11-19 DIAGNOSIS — E785 Hyperlipidemia, unspecified: Secondary | ICD-10-CM

## 2012-11-19 DIAGNOSIS — E11622 Type 2 diabetes mellitus with other skin ulcer: Secondary | ICD-10-CM

## 2012-11-19 DIAGNOSIS — E1169 Type 2 diabetes mellitus with other specified complication: Secondary | ICD-10-CM

## 2012-11-19 MED ORDER — MELOXICAM 15 MG PO TABS: 15.0000 mg | ORAL_TABLET | Freq: Every day | ORAL | Status: DC

## 2012-11-19 MED ORDER — GLIPIZIDE ER 10 MG PO TB24: 10.0000 mg | ORAL_TABLET | Freq: Every day | ORAL | Status: DC

## 2012-11-19 NOTE — Progress Notes (Signed)
Nature conservation officer at [facility] [address] [place] [address] Phone: [phone] Fax: [phone]  Date:  11/19/2012   Name:  [name] [name]   DOB:  June 07, 1960   MRN:  [id number] Gender: male Age: 52 y.o.  Primary Physician:  Hannah Beat, MD  Evaluating MD: Hannah Beat, MD   Chief Complaint: Follow-up, Diabetes and wound on leg   History of Present Illness:  [name] [name] is a 52 y.o. pleasant patient with BMI of 51 who presents with the following:  F/u multiple chronic problems, as well as several acute problems.   Diabetes Mellitus: Tolerating Medications: yes Compliance with diet: no Exercise: no, minimal  Avg blood sugars at home: 100-200 Foot problems: ulcer on L  foot and large ulcer on L lower extremity Hypoglycemia: none No nausea, vomitting, blurred vision, polyuria.  Lab Results  Component Value Date   HGBA1C 7.6* 11/15/2012    Wt Readings from Last 3 Encounters:  11/19/12 376 lb (170.552 kg)  03/07/12 375 lb 8 oz (170.326 kg)  11/25/10 358 lb 12.8 oz (162.751 kg)    Body mass index is 50.98 kg/(m^2).   HTN: Tolerating all medications without side effects Stable and at goal No CP, no sob. No HA.  BP Readings from Last 3 Encounters:  11/19/12 130/80  03/07/12 148/78  11/25/10 120/70    Basic Metabolic Panel:    Component Value Date/Time   NA 141 11/15/2012 0806   NA 139 04/09/2010 1229   K 4.2 11/15/2012 0806   CL 103 11/15/2012 0806   CO2 24 11/15/2012 0806   BUN 16 11/15/2012 0806   BUN 17 04/09/2010 1229   CREATININE 0.92 11/15/2012 0806   GLUCOSE 175* 11/15/2012 0806   GLUCOSE 155 04/09/2010 1229   CALCIUM 9.5 11/15/2012 0806   Lipids: Doing well, stable. Tolerating meds fine with no SE. Panel reviewed with patient.  Lipids:    Component Value Date/Time   TRIG 129 11/15/2012 0806   HDL 33* 11/15/2012 0806    Lab Results  Component Value Date   ALT 26 11/15/2012   AST 19 11/15/2012   ALKPHOS 44 11/15/2012   BILITOT 0.4 11/15/2012    Tob: at least 1 pack of tobacco a day. Increased with more stress due to his child's recent wedding.  Leg wound: he has had a left lateral leg wound for over 2 months now. It is quite painful. Left lateral leg: wound. He and his wife, who is an experienced RN, have been treating it with a wound cream based out of honey. Not improving and worsening.  1 x 2 inch  Has done some compression hose, which hurts a lot.  Right 2nd and 3rd fingers, cold. Do not hurt, but cold.   From L leg down, was on mobic and took off of it.  Has RLS syndrome at night, and will help a lot when takes NSAIDS / mobic, but now he does not have any.   Patient Active Problem List   Diagnosis Date Noted  . Anxiety   . Diabetes mellitus type II   . HYPERLIPIDEMIA 01/16/2009  . TOBACCO ABUSE 07/27/2007  . OBESITY, MORBID 09/29/2006  . HYPERTENSION 09/29/2006  . CALCULUS, KIDNEY 05/30/2002    Past Medical History  Diagnosis Date  . Anxiety   . Diabetes mellitus type II   . HTN (hypertension)   . Morbid obesity   . HLD (hyperlipidemia)   . Tobacco abuse     Past Surgical History  Procedure Laterality Date  . Kidney stone removal  2004    History   Social History  . Marital Status: Married    Spouse Name: N/A    Number of Children: 3  . Years of Education: N/A   Occupational History  . [employer]    Social History Main Topics  . Smoking status: Current Every Day Smoker -- 75 years    Types: Cigarettes  . Smokeless tobacco: Never Used     Comment: one cigarette daily  . Alcohol Use: Yes     Comment: rarely  . Drug Use: No  . Sexually Active: Yes -- Male partner(s)   Other Topics Concern  . Not on file   Social History Narrative  . No narrative on file    Family History  Problem Relation Age of Onset  . Stroke Mother     multiple  . Leukemia Mother   . Pneumonia Mother     Allergies  Allergen Reactions  . Penicillins      REACTION: unspecified    Medication list has been reviewed and updated.  Outpatient Prescriptions Prior to Visit  Medication Sig Dispense Refill  . aspirin 81 MG tablet Take 81 mg by mouth daily.        Marland Kitchen BAYER CONTOUR NEXT TEST test strip Test two times daily or as  instructed  200 each  3  . BAYER MICROLET LANCETS lancets Use to test twice daily or as instructed  200 each  3  . ECHINACEA PO Take by mouth daily.      . fish oil-omega-3 fatty acids 1000 MG capsule Take 1 g by mouth daily.        Marland Kitchen glipiZIDE (GLUCOTROL XL) 5 MG 24 hr tablet Take 1 tablet (5 mg total) by mouth daily.  90 tablet  3  . glucosamine-chondroitin 500-400 MG tablet Take 1 tablet by mouth daily.        Marland Kitchen lisinopril-hydrochlorothiazide (PRINZIDE,ZESTORETIC) 20-12.5 MG per tablet TAKE 2 TABLETS BY MOUTH ONCE A DAY  180 tablet  3  . metFORMIN (GLUCOPHAGE-XR) 500 MG 24 hr tablet TAKE 4 TABLETS BY MOUTH DAILY WITH BREAKFAST  360 tablet  3  . Multiple Vitamin (MULTIVITAMIN) tablet Take 1 tablet by mouth daily.        Marland Kitchen PARoxetine (PAXIL) 20 MG tablet Take 1 tablet (20 mg total) by mouth every morning.  90 tablet  3  . simvastatin (ZOCOR) 40 MG tablet Take 1 tablet (40 mg total) by mouth at bedtime.  90 tablet  3  . vitamin C (ASCORBIC ACID) 500 MG tablet Take 500 mg by mouth daily.      . NONFORMULARY OR COMPOUNDED ITEM Epic Account: [id number] Lab Studies:  FLP, 272.4 A1c, 250.00 Urine microalbumin, 250.00 Cbc with diff, HFP, BMET: v58.69 PSA: v76.44  1 each  0   No facility-administered medications prior to visit.    Review of Systems:   GEN: acute diabetic ulcer. No fever, chills.  Chest: no CP GI: No n/v/d, eating normally Pulm: No SOB Chronic venous stasis changes Interactive and getting along well at home. Otherwise, the pertinent positives and negatives are listed above and in the HPI, otherwise a full review of systems has been reviewed and is negative unless noted positive.   Physical  Examination: BP 130/80  Pulse 110  Temp(Src) 98.2 F (36.8 C) (Oral)  Ht 6' (1.829 m)  Wt 376 lb (170.552 kg)  BMI 50.98 kg/m2  SpO2 95%  Ideal Body Weight: Weight in (lb) to have BMI = 25: 183.9   GEN: WDWN, NAD, Non-toxic, A & O x 3 HEENT: Atraumatic, Normocephalic. Neck supple. No masses, No LAD. Ears and Nose: No external deformity. CV: RRR, No M/G/R. No JVD. No thrill. No extra heart sounds. PULM: CTA B, no wheezes, crackles, rhonchi. No retractions. No resp. distress. No accessory muscle use. EXTR: No mild edema. SKIN: L midportion of lower extremity with 1 x 2 INCH ulcer, clearly through dermis. 3 mm ulcer on the foot on the same side as well.  NEURO Normal gait.  PSYCH: Normally interactive. Conversant. Not depressed or anxious appearing.  Calm demeanor.     Assessment and Plan:  Diabetic ulcer of calf - Plan: AMB referral to wound care center: high level of concern with this problem reviewed with this patient. Consult wound care to assist in a diabetic with BMI of 51.  HYPERLIPIDEMIA: stable  OBESITY, MORBID: discussed importance of weight loss long term.  TOBACCO ABUSE: chronic tobacco abuse. Down from where he was, but he up from where he was at the low point.  HYPERTENSION: stable  Type 2 diabetes mellitus - Plan: AMB referral to wound care center: unstable with a1c at 7.6, will try for tighter control given wound. Diet. Increase glipizide dosing.  Restless legs syndrome (RLS): trial of mobic  >40 minutes spent in face to face time with patient, >50% spent in counselling or coordination of care: spent in discussion regardnig all of the above issues. More on wound.  Orders Today:  Orders Placed This Encounter  Procedures  . AMB referral to wound care center    Referral Priority:  Routine    Referral Type:  Consultation    Requested Specialty:  Wound Care    Number of Visits Requested:  1    Updated Medication List: (Includes new medications, updates to  list, dose adjustments) Meds ordered this encounter  Medications  . meloxicam (MOBIC) 15 MG tablet    Sig: Take 1 tablet (15 mg total) by mouth daily.    Dispense:  30 tablet    Refill:  0  . meloxicam (MOBIC) 15 MG tablet    Sig: Take 1 tablet (15 mg total) by mouth daily.    Dispense:  90 tablet    Refill:  3  . glipiZIDE (GLUCOTROL XL) 10 MG 24 hr tablet    Sig: Take 1 tablet (10 mg total) by mouth daily.    Dispense:  90 tablet    Refill:  3    Medications Discontinued: Medications Discontinued During This Encounter  Medication Reason  . NONFORMULARY OR COMPOUNDED ITEM Error  . glipiZIDE (GLUCOTROL XL) 5 MG 24 hr tablet Reorder      Signed, Kenyette Gundy T. Evanee Lubrano, MD 11/19/2012 10:30 AM

## 2012-11-19 NOTE — Patient Instructions (Addendum)
REFERRAL: GO THE THE FRONT ROOM AT THE ENTRANCE OF OUR CLINIC, NEAR CHECK IN. ASK FOR [name] [name]. SHE WILL HELP YOU SET UP YOUR REFERRAL. DATE: TIME:  

## 2012-11-20 ENCOUNTER — Encounter: Payer: Self-pay | Admitting: Family Medicine

## 2012-11-20 DIAGNOSIS — G2581 Restless legs syndrome: Secondary | ICD-10-CM | POA: Insufficient documentation

## 2012-11-20 HISTORY — DX: Restless legs syndrome: G25.81

## 2012-11-28 ENCOUNTER — Encounter (HOSPITAL_BASED_OUTPATIENT_CLINIC_OR_DEPARTMENT_OTHER): Payer: 59 | Attending: General Surgery

## 2012-11-28 DIAGNOSIS — E119 Type 2 diabetes mellitus without complications: Secondary | ICD-10-CM | POA: Insufficient documentation

## 2012-11-28 DIAGNOSIS — Z79899 Other long term (current) drug therapy: Secondary | ICD-10-CM | POA: Insufficient documentation

## 2012-11-28 DIAGNOSIS — I872 Venous insufficiency (chronic) (peripheral): Secondary | ICD-10-CM | POA: Insufficient documentation

## 2012-11-28 DIAGNOSIS — L97809 Non-pressure chronic ulcer of other part of unspecified lower leg with unspecified severity: Secondary | ICD-10-CM | POA: Insufficient documentation

## 2012-11-29 NOTE — Progress Notes (Signed)
[facility]  NAME:  [name] [name], [name] [name]              ACCOUNT NO.:  [id number]  MEDICAL RECORD NO.:  [id number]      DATE OF BIRTH:  [birth year]  PHYSICIAN:  Ardath Sax, M.D.           VISIT DATE:                                  OFFICE VISIT   HISTORY OF PRESENT ILLNESS:  He is a 52 year old, morbidly obese diabetic who comes to [place] with a nonhealing ulcer on his left leg, which is about 2-3 cm in diameter.  I tried to debride it, he would not allow me to do so.  He said it was too painful.  He weighs 376 pounds.  His blood pressure today was 126/80, his pulse is 70, temperature 98.  His blood sugar was 130.  He takes many medicines for diabetes and hypertension.  His medicines include aspirin, fish oil, glipizide, glucosamine with chondroitin, metformin, vitamins, Paxil, simvastatin, vitamin C, and Echinacea.  Today since I was unable to debride this, we are giving him a prescription for Santyl and we will wrap his leg.  He is diabetic, but this ulcer is clearly venous stasis ulcer, and we put him in an [employer], and we will see him back here in a week.     Ardath Sax, M.D.     PP/MEDQ  D:  11/28/2012  T:  11/29/2012  Job:  [id number]

## 2012-12-06 ENCOUNTER — Other Ambulatory Visit: Payer: Self-pay

## 2012-12-14 ENCOUNTER — Other Ambulatory Visit: Payer: Self-pay | Admitting: Family Medicine

## 2012-12-25 ENCOUNTER — Other Ambulatory Visit: Payer: Self-pay | Admitting: *Deleted

## 2012-12-25 MED ORDER — BAYER MICROLET LANCETS MISC: Status: DC

## 2012-12-25 MED ORDER — PAROXETINE HCL 20 MG PO TABS: 20.0000 mg | ORAL_TABLET | ORAL | Status: DC

## 2012-12-25 MED ORDER — SIMVASTATIN 40 MG PO TABS: 40.0000 mg | ORAL_TABLET | Freq: Every day | ORAL | Status: DC

## 2012-12-25 MED ORDER — METFORMIN HCL ER 500 MG PO TB24: ORAL_TABLET | ORAL | Status: DC

## 2012-12-25 MED ORDER — LISINOPRIL-HYDROCHLOROTHIAZIDE 20-12.5 MG PO TABS: ORAL_TABLET | ORAL | Status: DC

## 2012-12-28 ENCOUNTER — Encounter (HOSPITAL_BASED_OUTPATIENT_CLINIC_OR_DEPARTMENT_OTHER): Payer: 59 | Attending: General Surgery

## 2012-12-28 DIAGNOSIS — L97909 Non-pressure chronic ulcer of unspecified part of unspecified lower leg with unspecified severity: Secondary | ICD-10-CM | POA: Insufficient documentation

## 2012-12-28 DIAGNOSIS — I87319 Chronic venous hypertension (idiopathic) with ulcer of unspecified lower extremity: Secondary | ICD-10-CM | POA: Insufficient documentation

## 2013-01-29 ENCOUNTER — Other Ambulatory Visit: Payer: Self-pay | Admitting: *Deleted

## 2013-01-29 MED ORDER — PAROXETINE HCL 20 MG PO TABS: 20.0000 mg | ORAL_TABLET | ORAL | Status: DC

## 2013-02-01 ENCOUNTER — Encounter (HOSPITAL_BASED_OUTPATIENT_CLINIC_OR_DEPARTMENT_OTHER): Payer: 59 | Attending: General Surgery

## 2013-02-01 DIAGNOSIS — I87319 Chronic venous hypertension (idiopathic) with ulcer of unspecified lower extremity: Secondary | ICD-10-CM | POA: Insufficient documentation

## 2013-02-01 DIAGNOSIS — L97909 Non-pressure chronic ulcer of unspecified part of unspecified lower leg with unspecified severity: Secondary | ICD-10-CM | POA: Insufficient documentation

## 2013-02-24 ENCOUNTER — Other Ambulatory Visit: Payer: Self-pay | Admitting: Family Medicine

## 2013-03-01 ENCOUNTER — Encounter (HOSPITAL_BASED_OUTPATIENT_CLINIC_OR_DEPARTMENT_OTHER): Payer: 59 | Attending: General Surgery

## 2013-03-01 DIAGNOSIS — L97809 Non-pressure chronic ulcer of other part of unspecified lower leg with unspecified severity: Secondary | ICD-10-CM | POA: Insufficient documentation

## 2013-03-01 DIAGNOSIS — I87309 Chronic venous hypertension (idiopathic) without complications of unspecified lower extremity: Secondary | ICD-10-CM | POA: Insufficient documentation

## 2013-03-01 DIAGNOSIS — I872 Venous insufficiency (chronic) (peripheral): Secondary | ICD-10-CM | POA: Insufficient documentation

## 2013-03-29 ENCOUNTER — Other Ambulatory Visit (HOSPITAL_COMMUNITY): Payer: Self-pay | Admitting: General Surgery

## 2013-03-29 ENCOUNTER — Ambulatory Visit (HOSPITAL_COMMUNITY)
Admission: RE | Admit: 2013-03-29 | Discharge: 2013-03-29 | Disposition: A | Discharge status: Home / Self Care | Payer: 59 | Source: Ambulatory Visit | Attending: General Surgery | Admitting: General Surgery

## 2013-03-29 DIAGNOSIS — L98499 Non-pressure chronic ulcer of skin of other sites with unspecified severity: Secondary | ICD-10-CM

## 2013-03-29 DIAGNOSIS — D839 Common variable immunodeficiency, unspecified: Secondary | ICD-10-CM

## 2013-03-29 DIAGNOSIS — T8130XA Disruption of wound, unspecified, initial encounter: Secondary | ICD-10-CM

## 2013-03-29 DIAGNOSIS — S8990XA Unspecified injury of unspecified lower leg, initial encounter: Secondary | ICD-10-CM | POA: Insufficient documentation

## 2013-03-29 DIAGNOSIS — E119 Type 2 diabetes mellitus without complications: Secondary | ICD-10-CM | POA: Insufficient documentation

## 2013-03-29 DIAGNOSIS — I739 Peripheral vascular disease, unspecified: Secondary | ICD-10-CM

## 2013-03-29 DIAGNOSIS — I8390 Asymptomatic varicose veins of unspecified lower extremity: Secondary | ICD-10-CM | POA: Insufficient documentation

## 2013-03-29 DIAGNOSIS — E1151 Type 2 diabetes mellitus with diabetic peripheral angiopathy without gangrene: Secondary | ICD-10-CM

## 2013-03-29 DIAGNOSIS — X58XXXA Exposure to other specified factors, initial encounter: Secondary | ICD-10-CM | POA: Insufficient documentation

## 2013-03-29 DIAGNOSIS — I10 Essential (primary) hypertension: Secondary | ICD-10-CM

## 2013-03-29 NOTE — Progress Notes (Signed)
VASCULAR LAB PRELIMINARY  PRELIMINARY  PRELIMINARY  PRELIMINARY  Bilateral lower extremity venous duplex completed.    Preliminary report:  Bilateral:  No evidence of DVT, superficial thrombosis, or Baker's Cyst. Tere is a large non thrombosed varicose vein coursing the medial aspect of the left lower leg and small varicosities in the right lower leg.   Enzio Buchler, RVS 03/29/2013, 12:33 PM

## 2013-04-04 ENCOUNTER — Other Ambulatory Visit: Payer: Self-pay

## 2013-04-05 ENCOUNTER — Encounter (HOSPITAL_BASED_OUTPATIENT_CLINIC_OR_DEPARTMENT_OTHER): Payer: 59 | Attending: General Surgery

## 2013-04-05 DIAGNOSIS — I87309 Chronic venous hypertension (idiopathic) without complications of unspecified lower extremity: Secondary | ICD-10-CM | POA: Insufficient documentation

## 2013-04-05 DIAGNOSIS — L97809 Non-pressure chronic ulcer of other part of unspecified lower leg with unspecified severity: Secondary | ICD-10-CM | POA: Insufficient documentation

## 2013-04-05 DIAGNOSIS — I872 Venous insufficiency (chronic) (peripheral): Secondary | ICD-10-CM | POA: Insufficient documentation

## 2013-04-18 ENCOUNTER — Encounter (HOSPITAL_COMMUNITY): Payer: 59

## 2013-04-24 ENCOUNTER — Other Ambulatory Visit: Payer: Self-pay | Admitting: *Deleted

## 2013-04-24 MED ORDER — GLUCOSE BLOOD VI STRP: ORAL_STRIP | Status: DC

## 2013-05-03 ENCOUNTER — Encounter (HOSPITAL_BASED_OUTPATIENT_CLINIC_OR_DEPARTMENT_OTHER): Payer: 59 | Attending: General Surgery

## 2013-05-03 DIAGNOSIS — L97909 Non-pressure chronic ulcer of unspecified part of unspecified lower leg with unspecified severity: Secondary | ICD-10-CM | POA: Insufficient documentation

## 2013-05-03 DIAGNOSIS — I87319 Chronic venous hypertension (idiopathic) with ulcer of unspecified lower extremity: Secondary | ICD-10-CM | POA: Insufficient documentation

## 2013-05-04 ENCOUNTER — Other Ambulatory Visit: Payer: Self-pay | Admitting: Family Medicine

## 2013-06-07 ENCOUNTER — Encounter (HOSPITAL_BASED_OUTPATIENT_CLINIC_OR_DEPARTMENT_OTHER): Payer: BC Managed Care – PPO | Attending: General Surgery

## 2013-06-07 DIAGNOSIS — L97909 Non-pressure chronic ulcer of unspecified part of unspecified lower leg with unspecified severity: Principal | ICD-10-CM

## 2013-06-07 DIAGNOSIS — I87339 Chronic venous hypertension (idiopathic) with ulcer and inflammation of unspecified lower extremity: Secondary | ICD-10-CM | POA: Insufficient documentation

## 2013-06-22 ENCOUNTER — Other Ambulatory Visit: Payer: Self-pay | Admitting: Family Medicine

## 2013-07-05 ENCOUNTER — Encounter (HOSPITAL_BASED_OUTPATIENT_CLINIC_OR_DEPARTMENT_OTHER): Payer: BC Managed Care – PPO | Attending: General Surgery

## 2013-07-05 DIAGNOSIS — L97809 Non-pressure chronic ulcer of other part of unspecified lower leg with unspecified severity: Secondary | ICD-10-CM | POA: Insufficient documentation

## 2013-07-11 ENCOUNTER — Telehealth: Payer: Self-pay | Admitting: Family Medicine

## 2013-07-11 NOTE — Telephone Encounter (Signed)
Pt called to see if 2/18 appt was necessary. Only needs medication switched to [facility]90 day supply b/c of insurance change. Can call pt work [phone] til 4:30pm or on cell phone after that. Thanks

## 2013-07-11 NOTE — Telephone Encounter (Signed)
Advised patient to keep his appointment scheduled on 02.18.2015.  Advised we need to be seeing him at least every 6 months to follow up on his diabetes.  He states he had his labs drawn at [facility] and will bring those results with him to his appointment.

## 2013-07-12 ENCOUNTER — Other Ambulatory Visit: Payer: Self-pay | Admitting: *Deleted

## 2013-07-12 DIAGNOSIS — L97909 Non-pressure chronic ulcer of unspecified part of unspecified lower leg with unspecified severity: Secondary | ICD-10-CM

## 2013-07-17 ENCOUNTER — Encounter: Payer: Self-pay | Admitting: Family Medicine

## 2013-07-17 ENCOUNTER — Ambulatory Visit (INDEPENDENT_AMBULATORY_CARE_PROVIDER_SITE_OTHER): Payer: BC Managed Care – PPO | Admitting: Family Medicine

## 2013-07-17 VITALS — BP 138/76 | HR 110 | Temp 98.3°F | Ht 72.0 in | Wt 382.0 lb

## 2013-07-17 DIAGNOSIS — Z6841 Body Mass Index (BMI) 40.0 and over, adult: Secondary | ICD-10-CM

## 2013-07-17 DIAGNOSIS — L97909 Non-pressure chronic ulcer of unspecified part of unspecified lower leg with unspecified severity: Secondary | ICD-10-CM

## 2013-07-17 DIAGNOSIS — M792 Neuralgia and neuritis, unspecified: Secondary | ICD-10-CM

## 2013-07-17 DIAGNOSIS — E785 Hyperlipidemia, unspecified: Secondary | ICD-10-CM

## 2013-07-17 DIAGNOSIS — F17200 Nicotine dependence, unspecified, uncomplicated: Secondary | ICD-10-CM

## 2013-07-17 DIAGNOSIS — E11622 Type 2 diabetes mellitus with other skin ulcer: Secondary | ICD-10-CM

## 2013-07-17 DIAGNOSIS — G5790 Unspecified mononeuropathy of unspecified lower limb: Secondary | ICD-10-CM

## 2013-07-17 DIAGNOSIS — E1169 Type 2 diabetes mellitus with other specified complication: Secondary | ICD-10-CM

## 2013-07-17 DIAGNOSIS — L97209 Non-pressure chronic ulcer of unspecified calf with unspecified severity: Secondary | ICD-10-CM

## 2013-07-17 DIAGNOSIS — L97929 Non-pressure chronic ulcer of unspecified part of left lower leg with unspecified severity: Principal | ICD-10-CM

## 2013-07-17 DIAGNOSIS — I10 Essential (primary) hypertension: Secondary | ICD-10-CM

## 2013-07-17 MED ORDER — PAROXETINE HCL 20 MG PO TABS: ORAL_TABLET | ORAL | Status: DC

## 2013-07-17 MED ORDER — GLUCOSE BLOOD VI STRP: ORAL_STRIP | Status: DC

## 2013-07-17 MED ORDER — GLIPIZIDE ER 10 MG PO TB24: 10.0000 mg | ORAL_TABLET | Freq: Every day | ORAL | Status: DC

## 2013-07-17 MED ORDER — SIMVASTATIN 40 MG PO TABS: ORAL_TABLET | ORAL | Status: DC

## 2013-07-17 MED ORDER — GABAPENTIN 300 MG PO CAPS: 300.0000 mg | ORAL_CAPSULE | Freq: Three times a day (TID) | ORAL | Status: DC

## 2013-07-17 MED ORDER — BAYER MICROLET LANCETS MISC: Status: DC

## 2013-07-17 MED ORDER — LISINOPRIL-HYDROCHLOROTHIAZIDE 20-12.5 MG PO TABS: ORAL_TABLET | ORAL | Status: DC

## 2013-07-17 MED ORDER — GABAPENTIN 300 MG PO CAPS: 900.0000 mg | ORAL_CAPSULE | Freq: Three times a day (TID) | ORAL | Status: DC

## 2013-07-17 MED ORDER — METFORMIN HCL ER 500 MG PO TB24: ORAL_TABLET | ORAL | Status: DC

## 2013-07-17 NOTE — Patient Instructions (Signed)

## 2013-07-17 NOTE — Progress Notes (Signed)
Pre-visit discussion using our clinic review tool. No additional management support is needed unless otherwise documented below in the visit note.  

## 2013-07-17 NOTE — Progress Notes (Signed)
Date:  07/17/2013   Name:  [name] [name]   DOB:  Nov 30, 1960   MRN:  [id number] Gender: male Age: 53 y.o.  Primary Physician:  Hannah BeatSpencer Arieona Swaggerty, MD   Chief Complaint: Diabetes   Subjective:   History of Present Illness:  [name] [name] is a 53 y.o. pleasant patient who presents with the following:  Diabetes Mellitus: Tolerating Medications: yes Compliance with diet: fair Exercise: minimal / intermittent Avg blood sugars at home: yes, 130-150 Foot problems: none, but large 8 month leg ulcer Hypoglycemia: none No nausea, vomitting, blurred vision, polyuria.  Wt Readings from Last 3 Encounters:  07/17/13 382 lb (173.274 kg)  11/19/12 376 lb (170.552 kg)  03/07/12 375 lb 8 oz (170.326 kg)    Body mass index is 51.8 kg/(m^2).   L large wound, diabetic wound. [facility].  In pain constantly with his left leg.  Infected multiple times.  DM, BS has been pretty well controlled.  130-160.  Still smoking. Has been a struggle to quit.  Intermittent neuropathic pain that is bothering him a lot on the LEFT leg.  neurontin taper  HTN: Tolerating all medications without side effects Stable and at goal No CP, no sob. No HA.  BP Readings from Last 3 Encounters:  07/17/13 138/76  11/19/12 130/80  03/07/12 148/78   GAD, stressed more with DM ulcer, pain, expense, and frustrated with lack of healing.  Weight is stable, but still struggling. Body mass index is 51.8 kg/(m^2).   Patient Active Problem List   Diagnosis Date Noted  . Diabetic leg ulcer 07/18/2013  . Restless legs syndrome (RLS) 11/20/2012  . Generalized anxiety disorder   . 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 HYPERLIPIDEMIA 01/16/2009  . TOBACCO ABUSE 07/27/2007  . Morbid obesity with BMI of 50.0-59.9, adult 09/29/2006  . HYPERTENSION 09/29/2006  . CALCULUS, KIDNEY 05/30/2002    Past Medical History  Diagnosis Date  . Anxiety   . Diabetes  mellitus type II   . HTN (hypertension)   . Morbid obesity   . HLD (hyperlipidemia)   . Tobacco abuse   . Restless legs syndrome (RLS) 11/20/2012    Past Surgical History  Procedure Laterality Date  . Kidney stone removal  2004    History   Social History  . Marital Status: Married    Spouse Name: N/A    Number of Children: 3  . Years of Education: N/A   Occupational History  . [employer] and [employer]    Social History Main Topics  . Smoking status: Current Every Day Smoker -- 75 years    Types: Cigarettes  . Smokeless tobacco: Never Used     Comment: one cigarette daily  . Alcohol Use: Yes     Comment: rarely  . Drug Use: No  . Sexual Activity: Yes    Partners: Female   Other Topics Concern  . Not on file   Social History Narrative  . No narrative on file    Family History  Problem Relation Age of Onset  . Stroke Mother     multiple  . Leukemia Mother   . Pneumonia Mother     Allergies  Allergen Reactions  . Penicillins     REACTION: unspecified    Medication list has been reviewed and updated.  Review of Systems:   GEN: No acute illnesses, no fevers, chills. GI: No n/v/d, eating normally Pulm: No SOB  Interactive and getting along well at home.  Otherwise, ROS is as per the HPI.  Objective:   Physical Examination: BP 138/76  Pulse 110  Temp(Src) 98.3 F (36.8 C) (Oral)  Ht 6' (1.829 m)  Wt 382 lb (173.274 kg)  BMI 51.80 kg/m2  Ideal Body Weight: Weight in (lb) to have BMI = 25: 183.9   GEN: WDWN, NAD, Non-toxic, A & O x 3 HEENT: Atraumatic, Normocephalic. Neck supple. No masses, No LAD. Ears and Nose: No external deformity. CV: RRR, No M/G/R. No JVD. No thrill. No extra heart sounds. PULM: CTA B, no wheezes, crackles, rhonchi. No retractions. No resp. distress. No accessory muscle use. EXTR: L leg wrapped by wound care, did not unwrap wound. Reviewed pictures that patient had on phone. NEURO Normal gait.  PSYCH:  Normally interactive. Conversant. Not depressed or anxious appearing.  Calm demeanor.   Laboratory and Imaging Data: Reviewed [facility] labs with normal CMP. BS 150. nonfasting.  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 Just had blood draw at wound care. Checking BS with fairly good control. Full CPX and labs during summer.   HYPERLIPIDEMIA: work on weight  HYPERTENSION: stable  Morbid obesity with BMI of 50.0-59.9, adult: ongoing chronic challenge  TOBACCO ABUSE: 5 minutes spent in counselling: The patient does smoke cigarettes, and we discussed that tobacco is harmful to one's overall health, and I made the recommendation to quit tobacco products.  Neuropathic pain, leg: neurontin taper and trial    Patient Instructions  Generic Gabapentin Titration Schedule  Generic Gabapentin (generic form of Neurontin) comes in 300 mg tablets or capsules.   You have to titrate your dose slowly to reduce side effects and reduce sedation / sleepiness.    Week               Breakfast  Lunch   Dinner One                 0   0   300 mg Two   300mg    0   300mg  Three  300mg    300mg    300mg  Four   300mg    300mg    600mg  (2 tabs) Five   600mg  (2 tabs) 300mg    600mg  (2 tabs) Six   600mg  (2 tabs) 600mg  (2 tabs) 600mg  (2 tabs) Seven  600mg  (2 tabs) 600mg  (2 tabs) 900mg  (3 tabs) Eight   900mg  (3 tabs) 600mg  (2 tabs) 900mg  (3 tabs)  If you have any problems at any time, drop back to the previous dosing schedule. Continue with this dose for 1 week, and then try to go up the next step again.       New medications, updates to list, dose adjustments: Meds ordered this encounter  Medications  . glipiZIDE (GLUCOTROL XL) 10 MG 24 hr tablet    Sig: Take 1 tablet (10 mg total) by mouth daily.    Dispense:  90 tablet    Refill:  3  . glucose blood  (BAYER CONTOUR NEXT TEST) test strip    Sig: Test two times daily or as  instructed    Dispense:  200 each    Refill:  3    Dx: 250.70, 443.81  . BAYER MICROLET LANCETS lancets    Sig: Use to test twice daily or  as directed.  Dx: 250.70,  443.81    Dispense:  200 each    Refill:  3  . lisinopril-hydrochlorothiazide (PRINZIDE,ZESTORETIC) 20-12.5 MG per tablet    Sig: Take 2 tablets by mouth  once a day    Dispense:  180 tablet    Refill:  3  . metFORMIN (GLUCOPHAGE-XR) 500 MG 24 hr tablet    Sig: Take 4 tablets by mouth  daily with breakfast    Dispense:  360 tablet    Refill:  3  . PARoxetine (PAXIL) 20 MG tablet    Sig: Take 1 tablet (20 mg total) by mouth every morning.    Dispense:  90 tablet    Refill:  0  . simvastatin (ZOCOR) 40 MG tablet    Sig: Take 1 tablet by mouth at  bedtime    Dispense:  90 tablet    Refill:  3  . gabapentin (NEURONTIN) 300 MG capsule    Sig: Take 1 capsule (300 mg total) by mouth 3 (three) times daily.    Dispense:  90 capsule    Refill:  3  . gabapentin (NEURONTIN) 300 MG capsule    Sig: Take 3 capsules (900 mg total) by mouth 3 (three) times daily.    Dispense:  270 capsule    Refill:  3  . meloxicam (MOBIC) 15 MG tablet    Sig: Take 1 tablet (15 mg total) by mouth daily.    Dispense:  90 tablet    Refill:  1    Signed,  Gavinn Collard T. Abaigeal Moomaw, MD, CAQ Sports Medicine  [facility] [address] [place] [address] Phone: [phone] Fax: [phone]  Patient's Medications  New Prescriptions   GABAPENTIN (NEURONTIN) 300 MG CAPSULE    Take 1 capsule (300 mg total) by mouth 3 (three) times daily.   GABAPENTIN (NEURONTIN) 300 MG CAPSULE    Take 3 capsules (900 mg total) by mouth 3 (three) times daily.  Previous Medications   ASPIRIN 81 MG TABLET    Take 81 mg by mouth daily.     ECHINACEA PO    Take by mouth daily.   FISH OIL-OMEGA-3 FATTY ACIDS 1000 MG CAPSULE    Take 1 g by mouth daily.      GLUCOSAMINE-CHONDROITIN 500-400 MG TABLET    Take 1 tablet by mouth daily.     MULTIPLE VITAMIN (MULTIVITAMIN) TABLET    Take 1 tablet by mouth daily.     VITAMIN C (ASCORBIC ACID) 500 MG TABLET    Take 500 mg by mouth daily.  Modified Medications   Modified Medication Previous Medication   BAYER MICROLET LANCETS LANCETS BAYER MICROLET LANCETS lancets      Use to test twice daily or  as directed.  Dx: 250.70, 443.81    Use to test twice daily or  as directed   GLIPIZIDE (GLUCOTROL XL) 10 MG 24 HR TABLET glipiZIDE (GLUCOTROL XL) 10 MG 24 hr tablet      Take 1 tablet (10 mg total) by mouth daily.    Take 1 tablet (10 mg total) by mouth daily.   GLUCOSE BLOOD (BAYER CONTOUR NEXT TEST) TEST STRIP BAYER CONTOUR NEXT TEST test strip      Test two times daily or as  instructed    Test two times daily or as  instructed   LISINOPRIL-HYDROCHLOROTHIAZIDE (PRINZIDE,ZESTORETIC) 20-12.5 MG PER TABLET lisinopril-hydrochlorothiazide (PRINZIDE,ZESTORETIC) 20-12.5 MG per tablet      Take 2 tablets by mouth  once a day  Take 2 tablets by mouth  once a day   MELOXICAM (MOBIC) 15 MG TABLET meloxicam (MOBIC) 15 MG tablet      Take 1 tablet (15 mg total) by mouth daily.    Take 1 tablet (15 mg total) by mouth daily.   METFORMIN (GLUCOPHAGE-XR) 500 MG 24 HR TABLET metFORMIN (GLUCOPHAGE-XR) 500 MG 24 hr tablet      Take 4 tablets by mouth  daily with breakfast    Take 4 tablets by mouth  daily with breakfast   PAROXETINE (PAXIL) 20 MG TABLET PARoxetine (PAXIL) 20 MG tablet      Take 1 tablet (20 mg total) by mouth every morning.    Take 1 tablet (20 mg total) by mouth every morning.   SIMVASTATIN (ZOCOR) 40 MG TABLET simvastatin (ZOCOR) 40 MG tablet      Take 1 tablet by mouth at  bedtime    Take 1 tablet by mouth at  bedtime  Discontinued Medications   MELOXICAM (MOBIC) 15 MG TABLET    Take 1 tablet (15 mg total) by mouth daily.

## 2013-07-18 ENCOUNTER — Telehealth: Payer: Self-pay | Admitting: Family Medicine

## 2013-07-18 DIAGNOSIS — E11622 Type 2 diabetes mellitus with other skin ulcer: Secondary | ICD-10-CM | POA: Insufficient documentation

## 2013-07-18 DIAGNOSIS — L97909 Non-pressure chronic ulcer of unspecified part of unspecified lower leg with unspecified severity: Secondary | ICD-10-CM

## 2013-07-18 MED ORDER — MELOXICAM 15 MG PO TABS: 15.0000 mg | ORAL_TABLET | Freq: Every day | ORAL | Status: DC

## 2013-07-18 NOTE — Telephone Encounter (Signed)
Relevant patient education assigned to patient using Emmi. ° °

## 2013-07-19 ENCOUNTER — Telehealth: Payer: Self-pay

## 2013-07-19 NOTE — Telephone Encounter (Signed)
Relevant patient education assigned to patient using Emmi. ° °

## 2013-07-22 ENCOUNTER — Encounter: Payer: Self-pay | Admitting: Family Medicine

## 2013-07-25 ENCOUNTER — Ambulatory Visit (INDEPENDENT_AMBULATORY_CARE_PROVIDER_SITE_OTHER): Payer: 59 | Admitting: Surgery

## 2013-07-29 ENCOUNTER — Encounter: Payer: Self-pay | Admitting: Vascular Surgery

## 2013-07-30 ENCOUNTER — Encounter (HOSPITAL_COMMUNITY): Payer: 59

## 2013-07-30 ENCOUNTER — Encounter: Payer: 59 | Admitting: Vascular Surgery

## 2013-08-02 ENCOUNTER — Encounter (HOSPITAL_BASED_OUTPATIENT_CLINIC_OR_DEPARTMENT_OTHER): Payer: BC Managed Care – PPO | Attending: General Surgery

## 2013-08-02 DIAGNOSIS — L089 Local infection of the skin and subcutaneous tissue, unspecified: Secondary | ICD-10-CM | POA: Insufficient documentation

## 2013-08-02 DIAGNOSIS — L97809 Non-pressure chronic ulcer of other part of unspecified lower leg with unspecified severity: Secondary | ICD-10-CM | POA: Insufficient documentation

## 2013-08-05 ENCOUNTER — Encounter (INDEPENDENT_AMBULATORY_CARE_PROVIDER_SITE_OTHER): Payer: Self-pay | Admitting: Surgery

## 2013-08-05 ENCOUNTER — Ambulatory Visit (INDEPENDENT_AMBULATORY_CARE_PROVIDER_SITE_OTHER): Payer: BC Managed Care – PPO | Admitting: Surgery

## 2013-08-05 VITALS — BP 166/94 | Temp 98.3°F | Resp 20 | Ht 73.0 in | Wt 382.4 lb

## 2013-08-05 DIAGNOSIS — L97929 Non-pressure chronic ulcer of unspecified part of left lower leg with unspecified severity: Principal | ICD-10-CM

## 2013-08-05 DIAGNOSIS — L97209 Non-pressure chronic ulcer of unspecified calf with unspecified severity: Secondary | ICD-10-CM

## 2013-08-05 DIAGNOSIS — E1169 Type 2 diabetes mellitus with other specified complication: Secondary | ICD-10-CM

## 2013-08-05 DIAGNOSIS — E11622 Type 2 diabetes mellitus with other skin ulcer: Secondary | ICD-10-CM

## 2013-08-05 NOTE — Progress Notes (Signed)
Chief Complaint:  Extending left lateral leg wound  History of Present Illness:  [name] [name] is an 53 y.o. male who had an insect bite last April resulting in about a 1-1/2-2 cm lesion. He now has a 4 x 5 cm full thickness wound on the lateral aspect of his left leg. He has good pulses in the dorsalis pedis region. He has some chronic venous stasis laterally but these are on the lateral aspect. He's been using multi-pharmacy topicals including enzymatic debrider's. He is diabetic he does still smoke although he is trying to stop.    Past Medical History  Diagnosis Date  . Anxiety   . Diabetes mellitus type II   . HTN (hypertension)   . Morbid obesity   . HLD (hyperlipidemia)   . Tobacco abuse   . Restless legs syndrome (RLS) 11/20/2012    Past Surgical History  Procedure Laterality Date  . Kidney stone removal  2004    Current Outpatient Prescriptions  Medication Sig Dispense Refill  . BAYER MICROLET LANCETS lancets Use to test twice daily or  as directed.  Dx: 250.70, 443.81  200 each  3  . gabapentin (NEURONTIN) 300 MG capsule Take 1 capsule (300 mg total) by mouth 3 (three) times daily.  90 capsule  3  . gabapentin (NEURONTIN) 300 MG capsule Take 3 capsules (900 mg total) by mouth 3 (three) times daily.  270 capsule  3  . glipiZIDE (GLUCOTROL XL) 10 MG 24 hr tablet Take 1 tablet (10 mg total) by mouth daily.  90 tablet  3  . glucosamine-chondroitin 500-400 MG tablet Take 1 tablet by mouth daily.        Marland Kitchen glucose blood (BAYER CONTOUR NEXT TEST) test strip Test two times daily or as  instructed  200 each  3  . lisinopril-hydrochlorothiazide (PRINZIDE,ZESTORETIC) 20-12.5 MG per tablet Take 2 tablets by mouth  once a day  180 tablet  3  . meloxicam (MOBIC) 15 MG tablet Take 1 tablet (15 mg total) by mouth daily.  90 tablet  1  . metFORMIN (GLUCOPHAGE-XR) 500 MG 24 hr tablet Take 4 tablets by mouth  daily with breakfast  360 tablet  3  . Multiple Vitamin (MULTIVITAMIN) tablet Take 1  tablet by mouth daily.        Marland Kitchen PARoxetine (PAXIL) 20 MG tablet Take 1 tablet (20 mg total) by mouth every morning.  90 tablet  0  . simvastatin (ZOCOR) 40 MG tablet Take 1 tablet by mouth at  bedtime  90 tablet  3  . vitamin C (ASCORBIC ACID) 500 MG tablet Take 500 mg by mouth daily.      Marland Kitchen aspirin 81 MG tablet Take 81 mg by mouth daily.        Marland Kitchen ECHINACEA PO Take by mouth daily.      . fish oil-omega-3 fatty acids 1000 MG capsule Take 1 g by mouth daily.         No current facility-administered medications for this visit.   Penicillins Family History  Problem Relation Age of Onset  . Stroke Mother     multiple  . Leukemia Mother   . Pneumonia Mother    Social History:   reports that he has been smoking Cigarettes.  He has been smoking about 0.00 packs per day for the past 75 years. He has never used smokeless tobacco. He reports that he drinks alcohol. He reports that he does not use illicit drugs.   REVIEW OF SYSTEMS -  PERTINENT POSITIVES ONLY: noncontributory  Physical Exam:   Blood pressure 166/94, temperature 98.3 F (36.8 C), resp. rate 20, height 6\' 1"  (1.854 m), weight 382 lb 6.4 oz (173.456 kg). Body mass index is 50.46 kg/(m^2).  Gen:  WDWN white male NAD  Neurological: Alert and oriented to person, place, and time. Motor and sensory function is grossly intact  Head: Normocephalic and atraumatic.    The leg has as described. He has had a culture that showed some Morganella species. He does work of lab core. The wound itself looks clean and although that the bottom its more sensitive in the there is a bridge of tissue and a 1 cm area below that. Question is whether this could be an extension her some underlying eroding infection.  LABORATORY RESULTS: No results found for this or any previous visit (from the past 48 hour(s)).  RADIOLOGY RESULTS: No results found.  Problem List: Patient Active Problem List   Diagnosis Date Noted  . Diabetic leg ulcer 07/18/2013   . Restless legs syndrome (RLS) 11/20/2012  . Generalized anxiety disorder   . 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 HYPERLIPIDEMIA 01/16/2009  . TOBACCO ABUSE 07/27/2007  . Morbid obesity with BMI of 50.0-59.9, adult 09/29/2006  . HYPERTENSION 09/29/2006  . CALCULUS, KIDNEY 05/30/2002    Assessment & Plan: I think we would treat this without a compression dressing but would encourage him to take daily lavage of this area with an Epson salt wash along with the shower. Would try going back to a Neosporin ointment daily and a light wound dressing. Today I used an Adaptic and Kerlix. I want to see him back in 2 weeks to assess his progress. In the meantime easily go on the back from as prescribed by Dr. Jimmey RalphParker the wound center.    Matt B. Daphine DeutscherMartin, MD, [facility]  [facility] [phone] beeper [phone]  08/05/2013 6:16 PM

## 2013-08-05 NOTE — Patient Instructions (Signed)
Take the bactrim as prescribed.  Immersion in the leg bath Neosporin ointment or cream to wound

## 2013-08-09 ENCOUNTER — Encounter (HOSPITAL_COMMUNITY): Payer: 59

## 2013-08-09 ENCOUNTER — Encounter: Payer: 59 | Admitting: Vascular Surgery

## 2013-08-21 ENCOUNTER — Ambulatory Visit (INDEPENDENT_AMBULATORY_CARE_PROVIDER_SITE_OTHER): Payer: BC Managed Care – PPO | Admitting: Surgery

## 2013-08-21 ENCOUNTER — Encounter (INDEPENDENT_AMBULATORY_CARE_PROVIDER_SITE_OTHER): Payer: Self-pay | Admitting: Surgery

## 2013-08-21 VITALS — BP 146/103 | HR 72 | Temp 98.6°F | Resp 18 | Ht 73.0 in | Wt 383.2 lb

## 2013-08-21 DIAGNOSIS — E11622 Type 2 diabetes mellitus with other skin ulcer: Secondary | ICD-10-CM

## 2013-08-21 DIAGNOSIS — E1169 Type 2 diabetes mellitus with other specified complication: Secondary | ICD-10-CM

## 2013-08-21 DIAGNOSIS — L97909 Non-pressure chronic ulcer of unspecified part of unspecified lower leg with unspecified severity: Secondary | ICD-10-CM

## 2013-08-21 MED ORDER — HYDROCODONE-ACETAMINOPHEN 5-325 MG PO TABS: 1.0000 | ORAL_TABLET | ORAL | Status: DC | PRN

## 2013-08-21 NOTE — Patient Instructions (Signed)
Complete antibiotics (done) Continue dressing changes as before

## 2013-08-21 NOTE — Progress Notes (Signed)
URGENT Office [name] [name] 53 y.o.  Body mass index is 50.57 kg/(m^2).  Patient Active Problem List   Diagnosis Date Noted  . Diabetic leg ulcer 07/18/2013  . Restless legs syndrome (RLS) 11/20/2012  . Generalized anxiety disorder   . 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 HYPERLIPIDEMIA 01/16/2009  . TOBACCO ABUSE 07/27/2007  . Morbid obesity with BMI of 50.0-59.9, adult 09/29/2006  . HYPERTENSION 09/29/2006  . CALCULUS, KIDNEY 05/30/2002    Allergies  Allergen Reactions  . Penicillins     REACTION: unspecified    Past Surgical History  Procedure Laterality Date  . Kidney stone removal  2004   [name], MD No diagnosis found.  Followup for pretibial ulcer.  His leg is not hurting. The margins had not expanded and actually looks like it is contracted some. He is continuing to treat this with Neosporin ointment and washing it daily. He also soaks and cements in salt water.  We had a nice discussion on watching the number carbs in his diet with weight loss. Want  to see him back in 3-4 weeks. I gave him a prescription for pain meds (Norco) if he needs it for pain.  Will try him off oral antibiotics and get him to try probiotics.    Imp:  Pain better; leg ulcer looks better;  Not using compression dressing.   Matt B. Daphine DeutscherMartin, MD, [facility] LPFACS  [facility] [phone] beeper [phone]  08/21/2013 5:34 PM

## 2013-09-18 ENCOUNTER — Encounter (INDEPENDENT_AMBULATORY_CARE_PROVIDER_SITE_OTHER): Payer: Self-pay | Admitting: Surgery

## 2013-09-18 ENCOUNTER — Ambulatory Visit (INDEPENDENT_AMBULATORY_CARE_PROVIDER_SITE_OTHER): Payer: BC Managed Care – PPO | Admitting: Surgery

## 2013-09-18 VITALS — BP 144/88 | HR 88 | Temp 98.3°F | Resp 18 | Ht 73.0 in | Wt 384.0 lb

## 2013-09-18 DIAGNOSIS — L97909 Non-pressure chronic ulcer of unspecified part of unspecified lower leg with unspecified severity: Secondary | ICD-10-CM

## 2013-09-18 DIAGNOSIS — E11622 Type 2 diabetes mellitus with other skin ulcer: Secondary | ICD-10-CM

## 2013-09-18 DIAGNOSIS — E1169 Type 2 diabetes mellitus with other specified complication: Secondary | ICD-10-CM

## 2013-09-18 NOTE — Progress Notes (Signed)
[name] [name] 53 y.o.  Body mass index is 50.67 kg/(m^2).  Patient Active Problem List   Diagnosis Date Noted  . Diabetic leg ulcer 07/18/2013  . Restless legs syndrome (RLS) 11/20/2012  . Generalized anxiety disorder   . 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 HYPERLIPIDEMIA 01/16/2009  . TOBACCO ABUSE 07/27/2007  . Morbid obesity with BMI of 50.0-59.9, adult 09/29/2006  . HYPERTENSION 09/29/2006  . CALCULUS, KIDNEY 05/30/2002    Allergies  Allergen Reactions  . Penicillins     REACTION: unspecified    Past Surgical History  Procedure Laterality Date  . Kidney stone removal  2004   [name], MD No diagnosis found.  Wound is less painful and not as deep.  Good granulation is beginning to form at the base.  Will see in 4 weeks.  Continue hydrotherapy with epsom salt and dressing change with neosporin.   Return 4 weeks Matt B. Daphine DeutscherMartin, MD, [facility]  [facility] [phone] beeper [phone]  09/18/2013 12:35 PM

## 2013-10-25 ENCOUNTER — Encounter (INDEPENDENT_AMBULATORY_CARE_PROVIDER_SITE_OTHER): Payer: BC Managed Care – PPO | Admitting: Surgery

## 2013-11-07 ENCOUNTER — Encounter (INDEPENDENT_AMBULATORY_CARE_PROVIDER_SITE_OTHER): Payer: Self-pay | Admitting: Surgery

## 2013-11-07 ENCOUNTER — Ambulatory Visit (INDEPENDENT_AMBULATORY_CARE_PROVIDER_SITE_OTHER): Payer: BC Managed Care – PPO | Admitting: Surgery

## 2013-11-07 VITALS — BP 130/74 | HR 104 | Temp 97.6°F | Ht 74.0 in | Wt 381.0 lb

## 2013-11-07 DIAGNOSIS — E1169 Type 2 diabetes mellitus with other specified complication: Secondary | ICD-10-CM

## 2013-11-07 DIAGNOSIS — E11622 Type 2 diabetes mellitus with other skin ulcer: Secondary | ICD-10-CM

## 2013-11-07 DIAGNOSIS — L97909 Non-pressure chronic ulcer of unspecified part of unspecified lower leg with unspecified severity: Secondary | ICD-10-CM

## 2013-11-07 NOTE — Progress Notes (Signed)
[name] [name] 53 y.o.  Body mass index is 48.9 kg/(m^2).  Patient Active Problem List   Diagnosis Date Noted  . Diabetic leg ulcer 07/18/2013  . Restless legs syndrome (RLS) 11/20/2012  . Generalized anxiety disorder   . 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 HYPERLIPIDEMIA 01/16/2009  . TOBACCO ABUSE 07/27/2007  . Morbid obesity with BMI of 50.0-59.9, adult 09/29/2006  . HYPERTENSION 09/29/2006  . CALCULUS, KIDNEY 05/30/2002    Allergies  Allergen Reactions  . Penicillins     REACTION: unspecified      Past Surgical History  Procedure Laterality Date  . Kidney stone removal  2004   Hannah Beat, MD No diagnosis found.  Wound has is no longer hurting, has many nodes of granulation tissue.  Looking much better.  Will continue Adaptic, neosporin, wrap.   He is cutting back on smoking, eating better, and taking better care of his health.   Will see back in 6 weeks.  Matt B. Daphine Deutscher, MD, [facility] [phone] beeper [phone]  11/07/2013 9:22 AM

## 2013-11-07 NOTE — Patient Instructions (Signed)
Continue current wound care and continue your lifestyle modifications which are enabling this wound to heal primarily.  Keep it up!

## 2013-12-20 ENCOUNTER — Ambulatory Visit (INDEPENDENT_AMBULATORY_CARE_PROVIDER_SITE_OTHER): Payer: BC Managed Care – PPO | Admitting: Surgery

## 2013-12-20 ENCOUNTER — Encounter (INDEPENDENT_AMBULATORY_CARE_PROVIDER_SITE_OTHER): Payer: Self-pay | Admitting: Surgery

## 2013-12-20 VITALS — BP 126/78 | HR 81 | Temp 97.2°F | Ht 74.0 in | Wt 387.0 lb

## 2013-12-20 DIAGNOSIS — L97909 Non-pressure chronic ulcer of unspecified part of unspecified lower leg with unspecified severity: Secondary | ICD-10-CM

## 2013-12-20 DIAGNOSIS — E1169 Type 2 diabetes mellitus with other specified complication: Secondary | ICD-10-CM

## 2013-12-20 DIAGNOSIS — L97929 Non-pressure chronic ulcer of unspecified part of left lower leg with unspecified severity: Secondary | ICD-10-CM

## 2013-12-20 DIAGNOSIS — L97209 Non-pressure chronic ulcer of unspecified calf with unspecified severity: Secondary | ICD-10-CM

## 2013-12-20 DIAGNOSIS — E11622 Type 2 diabetes mellitus with other skin ulcer: Secondary | ICD-10-CM

## 2013-12-20 NOTE — Progress Notes (Signed)
Joen LauraHerbert Tapp 53 y.o.  Body mass index is 49.67 kg/(m^2).  Patient Active Problem List   Diagnosis Date Noted  . Diabetic leg ulcer 07/18/2013  . Restless legs syndrome (RLS) 11/20/2012  . Generalized anxiety disorder   . 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 HYPERLIPIDEMIA 01/16/2009  . TOBACCO ABUSE 07/27/2007  . Morbid obesity with BMI of 50.0-59.9, adult 09/29/2006  . HYPERTENSION 09/29/2006  . CALCULUS, KIDNEY 05/30/2002    Allergies  Allergen Reactions  . Penicillins     REACTION: unspecified      Past Surgical History  Procedure Laterality Date  . Kidney stone removal  2004   [name], MD No diagnosis found.  He has been wearing a artificial spongy wrap on his leg that I think is causing him to retained fluid and macerated area. The wound has reepithelialization at the margins and in good beds of tissue in the middle portion. There is a lower drainage and spinal more sensitive. I think we will treat this with Neosporin ointment, Telfa. And a cotton Kerlix wrap. We were her frame from antibiotics the present time although if it gets red or or more painful we would go in treated with antibiotics. I will see him back in 4 weeks and see if this is getting better. I did fill out as FMLA forms.  Return 4 weeks Matt B. Daphine DeutscherMartin, MD, [facility]  [facility] [phone] beeper [phone]  12/20/2013 3:08 PM

## 2013-12-20 NOTE — Patient Instructions (Signed)
Kerlex, Telfa, and neosporin after shower

## 2014-01-16 ENCOUNTER — Encounter (INDEPENDENT_AMBULATORY_CARE_PROVIDER_SITE_OTHER): Payer: BC Managed Care – PPO | Admitting: Surgery

## 2014-01-17 ENCOUNTER — Ambulatory Visit (INDEPENDENT_AMBULATORY_CARE_PROVIDER_SITE_OTHER): Payer: BC Managed Care – PPO | Admitting: Surgery

## 2014-01-17 VITALS — BP 160/86 | HR 90 | Temp 97.7°F | Ht 74.0 in | Wt 384.4 lb

## 2014-01-17 DIAGNOSIS — E1169 Type 2 diabetes mellitus with other specified complication: Secondary | ICD-10-CM

## 2014-01-17 DIAGNOSIS — E11622 Type 2 diabetes mellitus with other skin ulcer: Secondary | ICD-10-CM

## 2014-01-17 DIAGNOSIS — L97909 Non-pressure chronic ulcer of unspecified part of unspecified lower leg with unspecified severity: Secondary | ICD-10-CM

## 2014-01-17 NOTE — Patient Instructions (Signed)
Check back with our office next week to see if we will be able to use a wound VAC on your leg wound.

## 2014-01-17 NOTE — Progress Notes (Signed)
[name] [name] 53 y.o.  Body mass index is 49.33 kg/(m^2).  Patient Active Problem List   Diagnosis Date Noted  . Diabetic leg ulcer 07/18/2013  . Restless legs syndrome (RLS) 11/20/2012  . Generalized anxiety disorder   . 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 HYPERLIPIDEMIA 01/16/2009  . TOBACCO ABUSE 07/27/2007  . Morbid obesity with BMI of 50.0-59.9, adult 09/29/2006  . HYPERTENSION 09/29/2006  . CALCULUS, KIDNEY 05/30/2002    Allergies  Allergen Reactions  . Penicillins     REACTION: unspecified      Past Surgical History  Procedure Laterality Date  . Kidney stone removal  2004   [name], MD No diagnosis found.  followup visit. I went ahead and found the wound to be stagnant in terms of its healing. It weeks a lot of fluid. It is at least 10 cm long. I photographed this and it is in the media section and I want to see if this would qualify for a wound VAC. Will then see him subsequently to make such arrangements. Matt B. Daphine DeutscherMartin, MD, [facility]  [facility] [phone] beeper [phone]  01/17/2014 5:51 PM

## 2014-01-23 ENCOUNTER — Other Ambulatory Visit (INDEPENDENT_AMBULATORY_CARE_PROVIDER_SITE_OTHER): Payer: Self-pay

## 2014-01-23 ENCOUNTER — Telehealth (INDEPENDENT_AMBULATORY_CARE_PROVIDER_SITE_OTHER): Payer: Self-pay

## 2014-01-23 DIAGNOSIS — E1169 Type 2 diabetes mellitus with other specified complication: Secondary | ICD-10-CM

## 2014-01-23 DIAGNOSIS — L97909 Non-pressure chronic ulcer of unspecified part of unspecified lower leg with unspecified severity: Secondary | ICD-10-CM

## 2014-01-23 NOTE — Telephone Encounter (Signed)
Order for Wound Vac faxed to [facility] with office notes, pathology, demographics copy of insurance card @ [phone].

## 2014-01-23 NOTE — Telephone Encounter (Signed)
Called and left message with [facility] Rep) to make aware that fax number for wound vac order is invalid and I need to speak to Rep asap to have vac placed on patient lower extremity

## 2014-01-27 ENCOUNTER — Telehealth (INDEPENDENT_AMBULATORY_CARE_PROVIDER_SITE_OTHER): Payer: Self-pay

## 2014-01-27 NOTE — Telephone Encounter (Signed)
Rec'd call from [place] with [employer].  They will send wound vac to patients home and [facility] will manage dressing changes on Monday, Wednesday & Friday's.  Wound vac to be set at Negative pressure -125, per Mardella Layman.  Order faxed to [facility] @ [phone]

## 2014-01-27 NOTE — Addendum Note (Signed)
Addended by: Maryan Puls on: 01/27/2014 09:57 AM   Modules accepted: Orders

## 2014-02-19 ENCOUNTER — Ambulatory Visit (INDEPENDENT_AMBULATORY_CARE_PROVIDER_SITE_OTHER): Payer: BC Managed Care – PPO | Admitting: Family Medicine

## 2014-02-19 ENCOUNTER — Encounter: Payer: Self-pay | Admitting: Family Medicine

## 2014-02-19 VITALS — BP 118/68 | HR 82 | Temp 98.2°F | Ht 74.0 in | Wt 382.5 lb

## 2014-02-19 DIAGNOSIS — L97902 Non-pressure chronic ulcer of unspecified part of unspecified lower leg with fat layer exposed: Secondary | ICD-10-CM

## 2014-02-19 DIAGNOSIS — Z79899 Other long term (current) drug therapy: Secondary | ICD-10-CM

## 2014-02-19 DIAGNOSIS — Z125 Encounter for screening for malignant neoplasm of prostate: Secondary | ICD-10-CM

## 2014-02-19 DIAGNOSIS — L97909 Non-pressure chronic ulcer of unspecified part of unspecified lower leg with unspecified severity: Secondary | ICD-10-CM

## 2014-02-19 DIAGNOSIS — I10 Essential (primary) hypertension: Secondary | ICD-10-CM

## 2014-02-19 DIAGNOSIS — E11622 Type 2 diabetes mellitus with other skin ulcer: Secondary | ICD-10-CM

## 2014-02-19 DIAGNOSIS — L88 Pyoderma gangrenosum: Secondary | ICD-10-CM

## 2014-02-19 DIAGNOSIS — E1169 Type 2 diabetes mellitus with other specified complication: Secondary | ICD-10-CM

## 2014-02-19 DIAGNOSIS — E785 Hyperlipidemia, unspecified: Secondary | ICD-10-CM

## 2014-02-19 DIAGNOSIS — L97929 Non-pressure chronic ulcer of unspecified part of left lower leg with unspecified severity: Principal | ICD-10-CM

## 2014-02-19 DIAGNOSIS — L97209 Non-pressure chronic ulcer of unspecified calf with unspecified severity: Secondary | ICD-10-CM

## 2014-02-19 MED ORDER — GLIPIZIDE ER 10 MG PO TB24: 20.0000 mg | ORAL_TABLET | Freq: Every day | ORAL | Status: DC

## 2014-02-19 MED ORDER — MELOXICAM 15 MG PO TABS: 15.0000 mg | ORAL_TABLET | Freq: Every day | ORAL | Status: DC

## 2014-02-19 NOTE — Progress Notes (Signed)
Pre visit review using our clinic review tool, if applicable. No additional management support is needed unless otherwise documented below in the visit note. 

## 2014-02-19 NOTE — Progress Notes (Signed)
Dr. Karleen Hampshire T. Juni Glaab, MD, CAQ [facility] [address] [place], [address] Phone: [phone] Fax: [phone]  02/19/2014  Patient: [name] [name], MRN: [id number], DOB: [birth year], 53 y.o.  Primary Physician:  Hannah Beat, MD  Chief Complaint: Diabetes  Subjective:   [name] [name] is a 53 y.o. very pleasant male patient who presents with the following:  Morbidly obese gentleman with a BMI of 49 with uncontrolled diabetes mellitusandlarge 10 cmLEFT leg ulcer that is now being treated by general surgery. Previously he has been going to wound management at [facility], but this did not yield an excellent outcome. He is very frustrated with this, and he spends more than 15 minutes talking to me just about his LEFT ulcer. He is very upset.  diaetes with LEFT leg ulcer. Thinks it is getting worse. Wound hurts with walking. Area around it is excruciating   Ran out of mobic and it was hurting a lot. Norco if before treating it every night. March until 3 weeks ago. He reports that he has been taking anywhere from 5-6 tablets of Mobic 15 mg per day. He also has been taking some Norco. This has all been for his leg ulcer.   Cut down on his smoking.   Checking his Blood sugars.  Eating a breakfast sandwich. When tested one morning it was 290. Mid 200's to less than that. He doesn't have any of his blood sugars with him, and he says at times its normal, and other times it is approaching 300.He is currently having a diabetic wound as above.He is not having any nausea or blurred vision.  HTN: Tolerating all medications without side effects Stable and at goal No CP, no sob. No HA.  BP Readings from Last 3 Encounters:  02/19/14 118/68  01/17/14 160/86  12/20/13 126/78   Lipids: Doing well, stable. Tolerating meds fine with no SE. Panel reviewed with patient.  Lipids:    Component Value Date/Time   TRIG 129 11/15/2012 0806   HDL  33* 11/15/2012 0806     Past Medical History, Surgical History, Social History, Family History, Problem List, Medications, and Allergies have been reviewed and updated if relevant.   GEN: No acute illnesses, no fevers, chills. GI: No n/v/d, eating normally Pulm: No SOB Interactive and getting along well at home - wife is supportive.  Otherwise, ROS is as per the HPI.  Objective:   BP 118/68  Pulse 82  Temp(Src) 98.2 F (36.8 C) (Oral)  Ht  (1.88 m)  Wt 382 lb 8 oz (173.501 kg)  BMI 49.09 kg/m2  GEN: WDWN, NAD, Non-toxic, A & O x 3 HEENT: Atraumatic, Normocephalic. Neck supple. No masses, No LAD. Ears and Nose: No external deformity. CV: RRR, No M/G/R. No JVD. No thrill. No extra heart sounds. PULM: CTA B, no wheezes, crackles, rhonchi. No retractions. No resp. distress. No accessory muscle use. EXTR: did not unwrap patient's wound NEURO Normal gait.  PSYCH: Normally interactive. Conversant. Not depressed or anxious appearing.  Calm demeanor.   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   We may need to add more diabetes medicine with close follow-up.   I am a bit of the loss on what to do to suggest for this gentleman with his large diabetic ulcer. He is quite distraught, and he asked for my suggestions and recommendations. I will look in the wound care in the surrounding area.  Decrease Mobic dose. Raises risk of renal failure and ulcer.  Follow-up: depending on labs  Patient Instructions  Split Metformin tablets to 2  tablets TWICE A DAY  Increase Glipizide tablets to 2 a day.  Try increasing the GABAPENTIN TO 4 TABLETS THREE TIMES A DAY     New Prescriptions   No medications on file   Orders Placed This Encounter  Procedures  . Basic metabolic panel  . CBC with Differential  . Hepatic function panel  . Hemoglobin A1c  . LDL cholesterol, direct  . PSA    Signed,  Karleen Hampshire T. Shedric Fredericks, MD   Patient's Medications  New Prescriptions   No medications on file  Previous Medications   ASPIRIN 81 MG TABLET    Take 81 mg by mouth daily.     BAYER MICROLET LANCETS LANCETS    Use to test twice daily or  as directed.  Dx: 250.70, 443.81   ECHINACEA PO    Take by mouth daily.   FISH OIL-OMEGA-3 FATTY ACIDS 1000 MG CAPSULE    Take 1 g by mouth daily.     GABAPENTIN (NEURONTIN) 300 MG CAPSULE    Take 1 capsule (300 mg total) by mouth 3 (three) times daily.   GLUCOSAMINE-CHONDROITIN 500-400 MG TABLET    Take 1 tablet by mouth daily.     GLUCOSE BLOOD (BAYER CONTOUR NEXT TEST) TEST STRIP    Test two times daily or as  instructed   HYDROCODONE-ACETAMINOPHEN (NORCO/VICODIN) 5-325 MG PER TABLET    Take 1 tablet by mouth every 4 (four) hours as needed for moderate pain.   LISINOPRIL-HYDROCHLOROTHIAZIDE (PRINZIDE,ZESTORETIC) 20-12.5 MG PER TABLET    Take 2 tablets by mouth  once a day   METFORMIN (GLUCOPHAGE-XR) 500 MG 24 HR TABLET    Take 4 tablets by mouth  daily with breakfast   MULTIPLE VITAMIN (MULTIVITAMIN) TABLET    Take 1 tablet by mouth daily.     PAROXETINE (PAXIL) 20 MG TABLET    Take 1 tablet (20 mg total) by mouth every morning.   SIMVASTATIN (ZOCOR) 40 MG TABLET    Take 1 tablet by mouth at  bedtime   VITAMIN C (ASCORBIC ACID) 500 MG TABLET    Take 500 mg by mouth daily.  Modified Medications   Modified Medication Previous Medication   GLIPIZIDE (GLUCOTROL XL) 10 MG 24 HR TABLET glipiZIDE (GLUCOTROL XL) 10 MG 24 hr tablet      Take 2 tablets (20 mg total) by mouth daily.    Take 1 tablet (10  mg total) by mouth daily.   MELOXICAM (MOBIC) 15 MG TABLET meloxicam (MOBIC) 15 MG tablet      Take 1 tablet (15 mg total) by mouth daily.    Take 1 tablet (15 mg total) by mouth daily.  Discontinued Medications   No medications on file

## 2014-02-19 NOTE — Patient Instructions (Addendum)
Split Metformin tablets to 2 tablets TWICE A DAY  Increase Glipizide tablets to 2 a day.  Try increasing the GABAPENTIN TO 4 TABLETS THREE TIMES A DAY

## 2014-02-21 ENCOUNTER — Telehealth: Payer: Self-pay

## 2014-02-21 ENCOUNTER — Other Ambulatory Visit: Payer: Self-pay | Admitting: *Deleted

## 2014-02-21 LAB — CBC WITH DIFFERENTIAL/PLATELET
Basophils Absolute: 0 10*3/uL (ref 0.0–0.2)
Basos: 0 %
EOS ABS: 0.2 10*3/uL (ref 0.0–0.4)
Eos: 3 %
HCT: 43.5 % (ref 37.5–51.0)
Hemoglobin: 15 g/dL (ref 12.6–17.7)
IMMATURE GRANS (ABS): 0 10*3/uL (ref 0.0–0.1)
IMMATURE GRANULOCYTES: 0 %
Lymphocytes Absolute: 1.7 10*3/uL (ref 0.7–3.1)
Lymphs: 22 %
MCH: 29.1 pg (ref 26.6–33.0)
MCHC: 34.5 g/dL (ref 31.5–35.7)
MCV: 85 fL (ref 79–97)
MONOS ABS: 1 10*3/uL — AB (ref 0.1–0.9)
Monocytes: 13 %
NEUTROS PCT: 62 %
Neutrophils Absolute: 4.8 10*3/uL (ref 1.4–7.0)
RBC: 5.15 x10E6/uL (ref 4.14–5.80)
RDW: 13.6 % (ref 12.3–15.4)
WBC: 7.7 10*3/uL (ref 3.4–10.8)

## 2014-02-21 LAB — HEPATIC FUNCTION PANEL
ALT: 22 IU/L (ref 0–44)
AST: 12 IU/L (ref 0–40)
Albumin: 4.1 g/dL (ref 3.5–5.5)
Alkaline Phosphatase: 46 IU/L (ref 39–117)
BILIRUBIN DIRECT: 0.1 mg/dL (ref 0.00–0.40)
Total Bilirubin: 0.3 mg/dL (ref 0.0–1.2)
Total Protein: 6.1 g/dL (ref 6.0–8.5)

## 2014-02-21 LAB — LDL CHOLESTEROL, DIRECT: LDL DIRECT: 76 mg/dL (ref 0–99)

## 2014-02-21 LAB — BASIC METABOLIC PANEL
BUN / CREAT RATIO: 14 (ref 9–20)
BUN: 13 mg/dL (ref 6–24)
CHLORIDE: 100 mmol/L (ref 97–108)
CO2: 25 mmol/L (ref 18–29)
Calcium: 9.8 mg/dL (ref 8.7–10.2)
Creatinine, Ser: 0.91 mg/dL (ref 0.76–1.27)
GFR calc non Af Amer: 97 mL/min/{1.73_m2} (ref >59)
GFR, EST AFRICAN AMERICAN: 112 mL/min/{1.73_m2} (ref >59)
GLUCOSE: 169 mg/dL — AB (ref 65–99)
POTASSIUM: 4.1 mmol/L (ref 3.5–5.2)
Sodium: 140 mmol/L (ref 134–144)

## 2014-02-21 LAB — PSA: PSA: 0.3 ng/mL (ref 0.0–4.0)

## 2014-02-21 LAB — HEMOGLOBIN A1C
Est. average glucose Bld gHb Est-mCnc: 203 mg/dL
Hgb A1c MFr Bld: 8.7 % — ABNORMAL HIGH (ref 4.8–5.6)

## 2014-02-21 MED ORDER — SAXAGLIPTIN HCL 2.5 MG PO TABS: 2.5000 mg | ORAL_TABLET | Freq: Every day | ORAL | Status: DC

## 2014-02-21 MED ORDER — MELOXICAM 15 MG PO TABS: 15.0000 mg | ORAL_TABLET | Freq: Every day | ORAL | Status: DC

## 2014-02-21 MED ORDER — GLIPIZIDE ER 10 MG PO TB24: 20.0000 mg | ORAL_TABLET | Freq: Every day | ORAL | Status: DC

## 2014-02-21 NOTE — Telephone Encounter (Signed)
Left message for [name] [name] that prescriptions have been resent to [facility].

## 2014-02-21 NOTE — Addendum Note (Signed)
Addended by: Damita Lack on: 02/21/2014 04:01 PM   Modules accepted: Orders

## 2014-02-21 NOTE — Telephone Encounter (Signed)
Pt left v/m;pt seen on 02/19/14 and thought meloxicam and glipizide was being sent to [facility]. The order class for meloxicam was no print and glipizide was print; pt request cb.

## 2014-02-21 NOTE — Telephone Encounter (Signed)
Onglyza 2.5 mg 30 day supply sent to [facility] and 90 day supply sent in to [facility] as requested by patient.

## 2014-02-24 NOTE — Addendum Note (Signed)
Addended by: Hannah Beat on: 02/24/2014 01:52 PM   Modules accepted: Orders

## 2014-03-24 ENCOUNTER — Encounter: Payer: Self-pay | Admitting: Family Medicine

## 2014-03-24 ENCOUNTER — Ambulatory Visit (INDEPENDENT_AMBULATORY_CARE_PROVIDER_SITE_OTHER): Payer: BC Managed Care – PPO | Admitting: Family Medicine

## 2014-03-24 VITALS — BP 118/62 | HR 104 | Temp 97.9°F | Ht 74.0 in | Wt 368.2 lb

## 2014-03-24 DIAGNOSIS — Z6841 Body Mass Index (BMI) 40.0 and over, adult: Secondary | ICD-10-CM

## 2014-03-24 DIAGNOSIS — E11621 Type 2 diabetes mellitus with foot ulcer: Secondary | ICD-10-CM

## 2014-03-24 DIAGNOSIS — L97529 Non-pressure chronic ulcer of other part of left foot with unspecified severity: Secondary | ICD-10-CM

## 2014-03-24 DIAGNOSIS — L97909 Non-pressure chronic ulcer of unspecified part of unspecified lower leg with unspecified severity: Secondary | ICD-10-CM

## 2014-03-24 DIAGNOSIS — L97929 Non-pressure chronic ulcer of unspecified part of left lower leg with unspecified severity: Secondary | ICD-10-CM

## 2014-03-24 DIAGNOSIS — E11622 Type 2 diabetes mellitus with other skin ulcer: Secondary | ICD-10-CM

## 2014-03-24 DIAGNOSIS — I10 Essential (primary) hypertension: Secondary | ICD-10-CM

## 2014-03-24 MED ORDER — OXYCODONE-ACETAMINOPHEN 10-325 MG PO TABS
1.0000 | ORAL_TABLET | ORAL | Status: DC | PRN
Start: 2014-03-24 — End: 2014-04-11

## 2014-03-24 NOTE — Progress Notes (Signed)
Pre visit review using our clinic review tool, if applicable. No additional management support is needed unless otherwise documented below in the visit note. 

## 2014-03-24 NOTE — Progress Notes (Signed)
Dr. Karleen HampshireSpencer T. Micalah Cabezas, MD, CAQ Sports Medicine Primary [facility] and Sports Medicine [address] [place], [id number] Phone: [phone] Fax: [phone]  03/24/2014  Patient: [name] LauraHerbert Mcmillon, MRN: [id number], DOB: 1960/08/24, 53 y.o.  Primary Physician:  Hannah BeatSpencer Farrell Pantaleo, MD  Chief Complaint: Follow-up  Subjective:   [name] [name] is a 53 y.o. very pleasant male patient who presents with the following:  Complex leg ulcer that has now been there for 18 months, has been seen by wound care, general surgery, now dermatology, and Dr. Terri PiedraLupton recommended a vascular consult to rule out arterial disease.   Lost > 20 pounds and BS doing much better.  Needs paperwork filled out for [employer]: FMLA: 2 times a week, 3 hours at a time.  Intermittent interval  3 days a month in case pain is so bad.   BS 80 - 200.  Diabetes Mellitus: Tolerating Medications: yes Compliance with diet: much improved Exercise: minimal / intermittent Avg blood sugars at home: mostly good, 80- max 200 post-prandial Foot problems: neuropathy Hypoglycemia: none No nausea, vomitting, blurred vision, polyuria.  Lab Results  Component Value Date   HGBA1C 8.7* 02/19/2014   HGBA1C 7.6* 11/15/2012   HGBA1C 7.2 04/09/2010   Lab Results  Component Value Date   MICROALBUR 2.82* 07/27/2007   LDLCALC 80 11/15/2012   CREATININE 0.91 02/19/2014    Wt Readings from Last 3 Encounters:  03/24/14 368 lb 4 oz (167.037 kg)  02/19/14 382 lb 8 oz (173.501 kg)  01/17/14 384 lb 6 oz (174.351 kg)    Body mass index is 47.26 kg/(m^2).   HTN: Tolerating all medications without side effects Stable and at goal No CP, no sob. No HA.  BP Readings from Last 3 Encounters:  03/24/14 118/62  02/19/14 118/68  01/17/14 160/86    Basic Metabolic Panel:    Component Value Date/Time   NA 140 02/19/2014 1635   NA 139 04/09/2010 1229   K 4.1 02/19/2014 1635   CL 100 02/19/2014 1635   CO2 25 02/19/2014 1635   BUN 13 02/19/2014  1635   BUN 17 04/09/2010 1229   CREATININE 0.91 02/19/2014 1635   GLUCOSE 169* 02/19/2014 1635   GLUCOSE 155 04/09/2010 1229   CALCIUM 9.8 02/19/2014 1635      Past Medical History, Surgical History, Social History, Family History, Problem List, Medications, and Allergies have been reviewed and updated if relevant.   GEN: No acute illnesses, no fevers, chills. GI: No n/v/d, eating normally Pulm: No SOB Interactive and getting along well at home.  Otherwise, ROS is as per the HPI.  Objective:   BP 118/62  Pulse 104  Temp(Src) 97.9 F (36.6 C) (Oral)  Ht 6\' 2"  (1.88 m)  Wt 368 lb 4 oz (167.037 kg)  BMI 47.26 kg/m2  GEN: WDWN, NAD, Non-toxic, A & O x 3 HEENT: Atraumatic, Normocephalic. Neck supple. No masses, No LAD. Ears and Nose: No external deformity. CV: RRR, No M/G/R. No JVD. No thrill. No extra heart sounds. PULM: CTA B, no wheezes, crackles, rhonchi. No retractions. No resp. distress. No accessory muscle use. NEURO Normal gait.  PSYCH: Normally interactive. Conversant. Not depressed or anxious appearing.  Calm demeanor.   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  18 months of leg wound is major problem. He has been seeing multiple specialists. 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 Terri PiedraLupton, who suggested Vascular consult. Also started Trental and steroids, and wound appears to be doing better. I agree with Dr. Terri PiedraLupton.  Follow-up: 3 mo  New Prescriptions   No medications on file   Orders Placed This Encounter  Procedures  . Ambulatory referral to Vascular Surgery     Signed,  Elpidio GaleaSpencer T. Brisa Auth, MD   Patient's Medications  New Prescriptions   No medications on file  Previous Medications   ASPIRIN 81 MG TABLET    Take 81 mg by mouth daily.     BAYER MICROLET LANCETS LANCETS    Use to test twice daily or  as directed.  Dx: 250.70, 443.81   ECHINACEA PO    Take by mouth daily.   FISH OIL-OMEGA-3 FATTY ACIDS 1000 MG CAPSULE    Take 1 g by mouth daily.     GLIPIZIDE (GLUCOTROL XL) 10 MG 24 HR TABLET    Take 2 tablets (20 mg total) by mouth daily.   GLUCOSAMINE-CHONDROITIN 500-400 MG TABLET    Take 1 tablet by mouth daily.     GLUCOSE BLOOD (BAYER CONTOUR NEXT TEST) TEST STRIP    Test two times daily or as  instructed   LISINOPRIL-HYDROCHLOROTHIAZIDE (PRINZIDE,ZESTORETIC) 20-12.5 MG PER TABLET    Take 2 tablets by mouth  once a day   MELOXICAM (MOBIC) 15 MG TABLET    Take 1 tablet (15 mg total) by mouth daily.   METFORMIN (GLUCOPHAGE-XR) 500 MG 24 HR TABLET    Take 4 tablets by mouth  daily with breakfast   MULTIPLE VITAMIN (MULTIVITAMIN) TABLET    Take 1 tablet by mouth daily.     PAROXETINE (PAXIL) 20 MG TABLET    Take 1 tablet (20 mg total) by mouth every morning.   PENTOXIFYLLINE (TRENTAL) 400 MG CR TABLET    Take 400 mg by mouth 3 (three) times daily with meals.    PREDNISONE (DELTASONE) 10 MG TABLET    Take 30 mg by mouth daily.    SAXAGLIPTIN HCL (ONGLYZA) 2.5 MG TABS TABLET    Take 1 tablet (2.5 mg total) by mouth daily.   SIMVASTATIN (ZOCOR) 40 MG TABLET    Take 1 tablet by mouth at  bedtime   TRIAMCINOLONE OINTMENT (KENALOG) 0.1 %       VANOS 0.1 % CREA       VITAMIN C (ASCORBIC ACID) 500 MG TABLET    Take 500 mg by mouth daily.  Modified Medications   Modified Medication Previous Medication   OXYCODONE-ACETAMINOPHEN (PERCOCET) 10-325 MG PER TABLET oxyCODONE-acetaminophen (PERCOCET) 10-325 MG per tablet      Take 1 tablet by mouth every 4 (four) hours as needed.    Take 1 tablet by mouth every 4 (four) hours as needed.   Discontinued  Medications   GABAPENTIN (NEURONTIN) 300 MG CAPSULE    Take 1 capsule (300 mg total) by mouth 3 (three) times daily.   HYDROCODONE-ACETAMINOPHEN (NORCO/VICODIN) 5-325 MG PER TABLET    Take 1 tablet by mouth every 4 (four) hours as needed for moderate pain.

## 2014-03-27 ENCOUNTER — Telehealth: Payer: Self-pay | Admitting: Family Medicine

## 2014-03-27 NOTE — Telephone Encounter (Signed)
FMLA forms in your In box for signature. Please review and sign.

## 2014-03-31 ENCOUNTER — Telehealth: Payer: Self-pay | Admitting: Family Medicine

## 2014-03-31 DIAGNOSIS — Z7689 Persons encountering health services in other specified circumstances: Secondary | ICD-10-CM

## 2014-03-31 NOTE — Telephone Encounter (Signed)
FMLA faxed 03/31/14. LVM on pt cell stating they have been faxed and a copy for him is at front desk.

## 2014-03-31 NOTE — Telephone Encounter (Signed)
Patient is calling to find out if his FMLA paperwork is ready.  Patient said it's due on 04/04/14.  Please call patient.

## 2014-03-31 NOTE — Telephone Encounter (Signed)
It is done.

## 2014-04-11 ENCOUNTER — Other Ambulatory Visit: Payer: Self-pay | Admitting: Family Medicine

## 2014-04-14 ENCOUNTER — Other Ambulatory Visit: Payer: Self-pay | Admitting: *Deleted

## 2014-04-14 DIAGNOSIS — L97929 Non-pressure chronic ulcer of unspecified part of left lower leg with unspecified severity: Secondary | ICD-10-CM

## 2014-04-14 MED ORDER — OXYCODONE-ACETAMINOPHEN 10-325 MG PO TABS: 1.0000 | ORAL_TABLET | ORAL | Status: DC | PRN

## 2014-04-14 NOTE — Telephone Encounter (Signed)
[name] notified prescription is ready to be picked up at front desk.

## 2014-04-15 ENCOUNTER — Encounter: Payer: Self-pay | Admitting: Vascular Surgery

## 2014-04-15 LAB — HM DIABETES EYE EXAM

## 2014-04-16 ENCOUNTER — Ambulatory Visit (HOSPITAL_COMMUNITY)
Admission: RE | Admit: 2014-04-16 | Discharge: 2014-04-16 | Disposition: A | Discharge status: Home / Self Care | Payer: BC Managed Care – PPO | Source: Ambulatory Visit | Attending: Vascular Surgery | Admitting: Vascular Surgery

## 2014-04-16 ENCOUNTER — Ambulatory Visit (INDEPENDENT_AMBULATORY_CARE_PROVIDER_SITE_OTHER)
Admission: RE | Admit: 2014-04-16 | Discharge: 2014-04-16 | Disposition: A | Discharge status: Home / Self Care | Payer: BC Managed Care – PPO | Source: Ambulatory Visit | Attending: Vascular Surgery | Admitting: Vascular Surgery

## 2014-04-16 ENCOUNTER — Ambulatory Visit (INDEPENDENT_AMBULATORY_CARE_PROVIDER_SITE_OTHER): Payer: BC Managed Care – PPO | Admitting: Vascular Surgery

## 2014-04-16 ENCOUNTER — Encounter: Payer: Self-pay | Admitting: Vascular Surgery

## 2014-04-16 VITALS — BP 137/79 | HR 86 | Temp 98.3°F | Resp 18 | Ht 72.0 in | Wt 368.0 lb

## 2014-04-16 DIAGNOSIS — L97929 Non-pressure chronic ulcer of unspecified part of left lower leg with unspecified severity: Secondary | ICD-10-CM

## 2014-04-16 DIAGNOSIS — I87312 Chronic venous hypertension (idiopathic) with ulcer of left lower extremity: Secondary | ICD-10-CM

## 2014-04-16 DIAGNOSIS — I83029 Varicose veins of left lower extremity with ulcer of unspecified site: Secondary | ICD-10-CM

## 2014-04-16 MED ORDER — PREDNISONE 5 MG PO TABS: 5.0000 mg | ORAL_TABLET | Freq: Every day | ORAL | Status: DC

## 2014-04-16 MED ORDER — PANTOPRAZOLE SODIUM 40 MG PO TBEC: 40.0000 mg | DELAYED_RELEASE_TABLET | Freq: Every day | ORAL | Status: DC

## 2014-04-16 NOTE — Progress Notes (Signed)
[facility] [place] HISTORY AND PHYSICAL   History of Present Illness:  Patient is a 53 y.o. year old male who presents for evaluation of a nonhealing wound of his left leg. The patient has a 19 month history of this wound in his left leg. It initially started out about 2 cm in diameter. It has now grown significantly. He was initially followed at the wound center for approximate 9 months. He has several skin substitutes placed. He also had Santyl medical honey calcium alginate and Profore dressings placed. None of these worked. The ulcer would shrink somewhat early and then become larger again. He has also tried Unna boots in the past but had a skin reaction to the chemicals an Radio broadcast assistantUnna boot and these were discontinued. He then switched from the wound center over to see Dr. Wenda LowMatt Martin from general surgery. He saw Dr. Daphine DeutscherMartin for several months and did initially had some improvement in the ulceration but then again it grew over time. He was then referred to Dr. 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 He was then placed on steroid cream as well as oral systemic steroids. The wound has also progressively enlarged since then. The patient is a smoker and greater than 3 minutes today were spent regarding smoking cessation. He currently smokes one half pack cigarettes per day. Other medical problems include anxiety, diabetes, hypertension, obesity, hyperlipidemia all of which are currently stable. I did have discussions with the patient today regarding his weight and counseled him that significant weight loss would also help reduce the risk of ulceration. He has changed to a low carbohydrate diet recently. However he is not really exercising much. I discussed with him today a walking program of 30 minutes daily.  Past Medical History  Diagnosis Date  . Anxiety   . Diabetes mellitus type II   . HTN (hypertension)   . Morbid obesity   . HLD  (hyperlipidemia)   . Tobacco abuse   . Restless legs syndrome (RLS) 11/20/2012    Past Surgical History  Procedure Laterality Date  . Kidney stone removal  2004    Social History History  Substance Use Topics  . Smoking status: Current Every Day Smoker -- 75 years    Types: Cigarettes  . Smokeless tobacco: Never Used     Comment: one cigarette daily  . Alcohol Use: Yes     Comment: rarely    Family History Family History  Problem Relation Age of Onset  . Stroke Mother     multiple  . Leukemia Mother   . Pneumonia Mother     Allergies  Allergies  Allergen Reactions  . Penicillins     REACTION: unspecified     Current Outpatient Prescriptions  Medication Sig Dispense Refill  . aspirin 81 MG tablet Take 81 mg by mouth daily.      Marland Kitchen. BAYER MICROLET LANCETS lancets Use to test twice daily or  as directed.  Dx: 250.70, 443.81 200 each 3  . ECHINACEA PO Take by mouth daily.    . fish oil-omega-3 fatty acids 1000 MG capsule Take 1 g by mouth daily.      Marland Kitchen. glipiZIDE (GLUCOTROL XL) 10 MG 24 hr tablet Take 2 tablets (20 mg total) by mouth daily. 90 tablet 3  . glucosamine-chondroitin 500-400 MG tablet Take 1 tablet by mouth daily.      Marland Kitchen. glucose blood (BAYER CONTOUR NEXT TEST) test strip Test two times daily or  as  instructed 200 each 3  . lisinopril-hydrochlorothiazide (PRINZIDE,ZESTORETIC) 20-12.5 MG per tablet Take 2 tablets by mouth  once a day 180 tablet 3  . meloxicam (MOBIC) 15 MG tablet Take 1 tablet (15 mg total) by mouth daily. 30 tablet 5  . metFORMIN (GLUCOPHAGE-XR) 500 MG 24 hr tablet Take 4 tablets by mouth  daily with breakfast 360 tablet 3  . Multiple Vitamin (MULTIVITAMIN) tablet Take 1 tablet by mouth daily.      Marland Kitchen. oxyCODONE-acetaminophen (PERCOCET) 10-325 MG per tablet Take 1 tablet by mouth every 4 (four) hours as needed. 30 tablet 0  . PARoxetine (PAXIL) 20 MG tablet Take 1 tablet (20 mg total) by mouth every morning. 90 tablet 0  . pentoxifylline  (TRENTAL) 400 MG CR tablet Take 400 mg by mouth 3 (three) times daily with meals.     . predniSONE (DELTASONE) 10 MG tablet Take 30 mg by mouth daily.     . saxagliptin HCl (ONGLYZA) 2.5 MG TABS tablet Take 1 tablet (2.5 mg total) by mouth daily. 30 tablet 0  . simvastatin (ZOCOR) 40 MG tablet Take 1 tablet by mouth at  bedtime 90 tablet 3  . triamcinolone ointment (KENALOG) 0.1 %     . VANOS 0.1 % CREA     . vitamin C (ASCORBIC ACID) 500 MG tablet Take 500 mg by mouth daily.     No current facility-administered medications for this visit.    ROS:   General:  No weight loss, Fever, chills  HEENT: No recent headaches, no nasal bleeding, no visual changes, no sore throat  Neurologic: No dizziness, blackouts, seizures. No recent symptoms of stroke or mini- stroke. No recent episodes of slurred speech, or temporary blindness.  Cardiac: No recent episodes of chest pain/pressure, no shortness of breath at rest.  No shortness of breath with exertion.  Denies history of atrial fibrillation or irregular heartbeat  Vascular: No history of rest pain in feet.  No history of claudication.  + history of non-healing ulcer, No history of DVT   Pulmonary: No home oxygen, no productive cough, no hemoptysis,  No asthma or wheezing  Musculoskeletal:  [ ]  Arthritis, [x ] Low back pain,  [x ] Joint pain  Hematologic:No history of hypercoagulable state.  No history of easy bleeding.  No history of anemia  Gastrointestinal: No hematochezia or melena,  No gastroesophageal reflux, no trouble swallowing  Urinary: [ ]  chronic Kidney disease, [ ]  on HD - [ ]  MWF or [ ]  TTHS, [ ]  Burning with urination, [ ]  Frequent urination, [ ]  Difficulty urinating;   Skin: + rashes  Psychological: + history of anxiety,  No history of depression   Physical Examination  Filed Vitals:   04/16/14 1209  BP: 137/79  Pulse: 86  Temp: 98.3 F (36.8 C)  TempSrc: Oral  Resp: 18  Height: 6' (1.829 m)  Weight: 368 lb  (166.924 kg)  SpO2: 96%     General:  Alert and oriented, no acute distress HEENT: Normal Neck: No bruit or JVD Pulmonary: Clear to auscultation bilaterally Cardiac: Regular Rate and Rhythm without murmur Abdomen: Soft, non-tender, non-distended, no mass, obese Skin: No rash, left leg ulcer over the anterior compartment 15 x 10 cm x 1 cm depth ulcer covered with necrotic debris and fibrinous exudate no evidence of granulation obviously penetrates at least to the muscle layer Extremity Pulses:  2+ radial, brachial, femoral, dorsalis pedis, posterior tibial pulses bilaterally Musculoskeletal: No deformity or edema  Neurologic: Upper and lower extremity motor 5/5 and symmetric  DATA: patient had a venous reflux exam today. I reviewed and interpreted this study. This showed reflux in the left greater saphenous vein. There was also some reflux in the left common femoral and popliteal vein and greater saphenous vein diameter was 7-10 mm.   ASSESSMENT:  #1 large ulceration left leg the patient with palpable pedal pulses so I do not suspect arterial etiology. This most likely represents a venous ulcer although the fact that it has penetrated to the muscle layer is somewhat worrisome for other possible etiologies. However, biopsy of the wound was performed in the past which showed no evidence of malignancy and only inflammatory tissue.   PLAN:  #1 we will taper off his steroids over the next 6 weeks since he is currently on 30 mg of prednisone daily as this is probably impairing wound healing we will also stop his steroid cream.  We will schedule him for possible laser ablation of his left greater saphenous vein with my partner Dr. Arbie Cookey or Dr. Hart Rochester within the next few weeks. He has documentation of over a year's worth of ulceration in his left lower extremity. I will also schedule him for operative debridement of the wound on November 30. I called Dr. Kelly Splinter with plastic surgery who also be available  at the time of the debridement so we can make some decisions on the best options for wound healing at that point.  #2 the patient was counseled and weight loss and some possible techniques for this and it was emphasized that venous ulcers are directly related to obesity, I also counseled him in smoking cessation and explained to him that his tobacco abuse in pairs wound healing he is going to try improve both these problems  Fabienne Bruns, MD [facility] [place] Office: [phone] Pager: [phone]

## 2014-04-16 NOTE — Addendum Note (Signed)
Addended by: Sharee PimpleMCCHESNEY, Rahim Astorga K on: 04/16/2014 04:11 PM   Modules accepted: Orders

## 2014-04-17 ENCOUNTER — Other Ambulatory Visit: Payer: Self-pay

## 2014-04-21 ENCOUNTER — Encounter (HOSPITAL_BASED_OUTPATIENT_CLINIC_OR_DEPARTMENT_OTHER): Payer: Self-pay | Admitting: *Deleted

## 2014-04-21 ENCOUNTER — Other Ambulatory Visit: Payer: Self-pay | Admitting: Plastic Surgery

## 2014-04-21 DIAGNOSIS — L97923 Non-pressure chronic ulcer of unspecified part of left lower leg with necrosis of muscle: Secondary | ICD-10-CM

## 2014-04-21 NOTE — Progress Notes (Signed)
   04/21/14 1050  OBSTRUCTIVE SLEEP APNEA  Have you ever been diagnosed with sleep apnea through a sleep study? No  Do you snore loudly (loud enough to be heard through closed doors)?  1  Do you often feel tired, fatigued, or sleepy during the daytime? 0  Has anyone observed you stop breathing during your sleep? 1  Do you have, or are you being treated for high blood pressure? 1  BMI more than 35 kg/m2? 1  Age over 53 years old? 1  Neck circumference greater than 40 cm/16 inches? 1  Gender: 1  Obstructive Sleep Apnea Score 7  Score 4 or greater  Results sent to PCP

## 2014-04-21 NOTE — Progress Notes (Signed)
Pt probably has sleep apnea-never tested-diabetic with long standing non healing wound LLL To come in for ekg-bmet

## 2014-04-22 ENCOUNTER — Encounter (HOSPITAL_BASED_OUTPATIENT_CLINIC_OR_DEPARTMENT_OTHER)
Admission: RE | Admit: 2014-04-22 | Discharge: 2014-04-22 | Disposition: A | Discharge status: Home / Self Care | Payer: BC Managed Care – PPO | Source: Ambulatory Visit | Attending: Plastic Surgery | Admitting: Plastic Surgery

## 2014-04-22 ENCOUNTER — Encounter (HOSPITAL_BASED_OUTPATIENT_CLINIC_OR_DEPARTMENT_OTHER): Payer: Self-pay | Admitting: *Deleted

## 2014-04-22 DIAGNOSIS — F419 Anxiety disorder, unspecified: Secondary | ICD-10-CM | POA: Diagnosis not present

## 2014-04-22 DIAGNOSIS — G2581 Restless legs syndrome: Secondary | ICD-10-CM | POA: Diagnosis not present

## 2014-04-22 DIAGNOSIS — F329 Major depressive disorder, single episode, unspecified: Secondary | ICD-10-CM | POA: Diagnosis not present

## 2014-04-22 DIAGNOSIS — E785 Hyperlipidemia, unspecified: Secondary | ICD-10-CM | POA: Diagnosis not present

## 2014-04-22 DIAGNOSIS — I493 Ventricular premature depolarization: Secondary | ICD-10-CM | POA: Diagnosis not present

## 2014-04-22 DIAGNOSIS — E119 Type 2 diabetes mellitus without complications: Secondary | ICD-10-CM | POA: Diagnosis not present

## 2014-04-22 DIAGNOSIS — F17210 Nicotine dependence, cigarettes, uncomplicated: Secondary | ICD-10-CM | POA: Diagnosis not present

## 2014-04-22 DIAGNOSIS — K219 Gastro-esophageal reflux disease without esophagitis: Secondary | ICD-10-CM | POA: Diagnosis not present

## 2014-04-22 DIAGNOSIS — I10 Essential (primary) hypertension: Secondary | ICD-10-CM | POA: Diagnosis not present

## 2014-04-22 DIAGNOSIS — I83028 Varicose veins of left lower extremity with ulcer other part of lower leg: Secondary | ICD-10-CM | POA: Diagnosis not present

## 2014-04-22 DIAGNOSIS — L97923 Non-pressure chronic ulcer of unspecified part of left lower leg with necrosis of muscle: Secondary | ICD-10-CM | POA: Diagnosis present

## 2014-04-22 LAB — BASIC METABOLIC PANEL
Anion gap: 14 (ref 5–15)
BUN: 23 mg/dL (ref 6–23)
CALCIUM: 9.7 mg/dL (ref 8.4–10.5)
CO2: 25 mEq/L (ref 19–32)
CREATININE: 0.87 mg/dL (ref 0.50–1.35)
Chloride: 101 mEq/L (ref 96–112)
GFR calc Af Amer: >90 mL/min (ref >90)
GLUCOSE: 111 mg/dL — AB (ref 70–99)
Potassium: 4.3 mEq/L (ref 3.7–5.3)
Sodium: 140 mEq/L (ref 137–147)

## 2014-04-22 NOTE — Progress Notes (Signed)
Dr Ivin Bootyrews into to evaluate pt airway,  [facility]

## 2014-04-23 ENCOUNTER — Ambulatory Visit (HOSPITAL_BASED_OUTPATIENT_CLINIC_OR_DEPARTMENT_OTHER): Payer: BC Managed Care – PPO | Admitting: Anesthesiology

## 2014-04-23 ENCOUNTER — Encounter (HOSPITAL_BASED_OUTPATIENT_CLINIC_OR_DEPARTMENT_OTHER): Payer: Self-pay | Admitting: Anesthesiology

## 2014-04-23 ENCOUNTER — Encounter (HOSPITAL_BASED_OUTPATIENT_CLINIC_OR_DEPARTMENT_OTHER)
Admission: RE | Disposition: A | Discharge status: Home / Self Care | Payer: Self-pay | Source: Ambulatory Visit | Attending: Plastic Surgery

## 2014-04-23 ENCOUNTER — Ambulatory Visit (HOSPITAL_BASED_OUTPATIENT_CLINIC_OR_DEPARTMENT_OTHER)
Admission: RE | Admit: 2014-04-23 | Discharge: 2014-04-23 | Disposition: A | Discharge status: Home / Self Care | Payer: BC Managed Care – PPO | Source: Ambulatory Visit | Attending: Plastic Surgery | Admitting: Plastic Surgery

## 2014-04-23 DIAGNOSIS — L97923 Non-pressure chronic ulcer of unspecified part of left lower leg with necrosis of muscle: Secondary | ICD-10-CM | POA: Diagnosis not present

## 2014-04-23 DIAGNOSIS — I493 Ventricular premature depolarization: Secondary | ICD-10-CM | POA: Insufficient documentation

## 2014-04-23 DIAGNOSIS — F17210 Nicotine dependence, cigarettes, uncomplicated: Secondary | ICD-10-CM | POA: Insufficient documentation

## 2014-04-23 DIAGNOSIS — E119 Type 2 diabetes mellitus without complications: Secondary | ICD-10-CM | POA: Insufficient documentation

## 2014-04-23 DIAGNOSIS — E785 Hyperlipidemia, unspecified: Secondary | ICD-10-CM | POA: Insufficient documentation

## 2014-04-23 DIAGNOSIS — F329 Major depressive disorder, single episode, unspecified: Secondary | ICD-10-CM | POA: Insufficient documentation

## 2014-04-23 DIAGNOSIS — I10 Essential (primary) hypertension: Secondary | ICD-10-CM | POA: Insufficient documentation

## 2014-04-23 DIAGNOSIS — K219 Gastro-esophageal reflux disease without esophagitis: Secondary | ICD-10-CM | POA: Insufficient documentation

## 2014-04-23 DIAGNOSIS — F419 Anxiety disorder, unspecified: Secondary | ICD-10-CM | POA: Insufficient documentation

## 2014-04-23 DIAGNOSIS — G2581 Restless legs syndrome: Secondary | ICD-10-CM | POA: Insufficient documentation

## 2014-04-23 DIAGNOSIS — I83028 Varicose veins of left lower extremity with ulcer other part of lower leg: Secondary | ICD-10-CM | POA: Insufficient documentation

## 2014-04-23 HISTORY — PX: MINOR APPLICATION OF WOUND VAC: SHX6243

## 2014-04-23 HISTORY — DX: Depression, unspecified: F32.A

## 2014-04-23 HISTORY — DX: Gastro-esophageal reflux disease without esophagitis: K21.9

## 2014-04-23 HISTORY — PX: INCISION AND DRAINAGE OF WOUND: SHX1803

## 2014-04-23 HISTORY — DX: Major depressive disorder, single episode, unspecified: F32.9

## 2014-04-23 LAB — GLUCOSE, CAPILLARY
Glucose-Capillary: 225 mg/dL — ABNORMAL HIGH (ref 70–99)
Glucose-Capillary: 214 mg/dL — ABNORMAL HIGH (ref 70–99)

## 2014-04-23 LAB — POCT HEMOGLOBIN-HEMACUE: HEMOGLOBIN: 17.8 g/dL — AB (ref 13.0–17.0)

## 2014-04-23 SURGERY — IRRIGATION AND DEBRIDEMENT WOUND
Anesthesia: General | Site: Leg Lower | Laterality: Left

## 2014-04-23 MED ORDER — OXYCODONE HCL 5 MG PO TABS
5.0000 mg | ORAL_TABLET | Freq: Once | ORAL | Status: AC | PRN
  Administered 2014-04-23: 5 mg via ORAL

## 2014-04-23 MED ORDER — FENTANYL CITRATE 0.05 MG/ML IJ SOLN: 50.0000 ug | INTRAMUSCULAR | Status: DC | PRN

## 2014-04-23 MED ORDER — LIDOCAINE HCL (PF) 1 % IJ SOLN
INTRAMUSCULAR | Status: AC
  Filled 2014-04-23: qty 30

## 2014-04-23 MED ORDER — OXYCODONE HCL 5 MG PO TABS
ORAL_TABLET | ORAL | Status: AC
  Filled 2014-04-23: qty 1

## 2014-04-23 MED ORDER — HYDROMORPHONE HCL 1 MG/ML IJ SOLN
INTRAMUSCULAR | Status: AC
  Filled 2014-04-23: qty 1

## 2014-04-23 MED ORDER — OXYCODONE HCL 5 MG/5ML PO SOLN: 5.0000 mg | Freq: Once | ORAL | Status: AC | PRN

## 2014-04-23 MED ORDER — CIPROFLOXACIN IN D5W 400 MG/200ML IV SOLN
INTRAVENOUS | Status: AC
  Filled 2014-04-23: qty 200

## 2014-04-23 MED ORDER — BUPIVACAINE HCL (PF) 0.25 % IJ SOLN
INTRAMUSCULAR | Status: AC
  Filled 2014-04-23: qty 30

## 2014-04-23 MED ORDER — CHLORHEXIDINE GLUCONATE CLOTH 2 % EX PADS: 6.0000 | MEDICATED_PAD | Freq: Once | CUTANEOUS | Status: DC

## 2014-04-23 MED ORDER — LIDOCAINE-EPINEPHRINE 1 %-1:100000 IJ SOLN
INTRAMUSCULAR | Status: AC
  Filled 2014-04-23: qty 1

## 2014-04-23 MED ORDER — LIDOCAINE HCL (CARDIAC) 20 MG/ML IV SOLN
INTRAVENOUS | Status: DC | PRN
  Administered 2014-04-23: 100 mg via INTRAVENOUS

## 2014-04-23 MED ORDER — BUPIVACAINE HCL (PF) 0.5 % IJ SOLN
INTRAMUSCULAR | Status: AC
  Filled 2014-04-23: qty 30

## 2014-04-23 MED ORDER — SODIUM CHLORIDE 0.9 % IV SOLN: INTRAVENOUS | Status: DC

## 2014-04-23 MED ORDER — BUPIVACAINE-EPINEPHRINE (PF) 0.25% -1:200000 IJ SOLN
INTRAMUSCULAR | Status: AC
  Filled 2014-04-23: qty 30

## 2014-04-23 MED ORDER — INSULIN ASPART 100 UNIT/ML ~~LOC~~ SOLN
6.0000 [IU] | Freq: Once | SUBCUTANEOUS | Status: AC
  Administered 2014-04-23: 6 [IU] via SUBCUTANEOUS

## 2014-04-23 MED ORDER — VANCOMYCIN HCL 10 G IV SOLR: 1500.0000 mg | INTRAVENOUS | Status: DC

## 2014-04-23 MED ORDER — MIDAZOLAM HCL 2 MG/2ML IJ SOLN: 1.0000 mg | INTRAMUSCULAR | Status: DC | PRN

## 2014-04-23 MED ORDER — FENTANYL CITRATE 0.05 MG/ML IJ SOLN
INTRAMUSCULAR | Status: AC
  Filled 2014-04-23: qty 6

## 2014-04-23 MED ORDER — CHLORHEXIDINE GLUCONATE 4 % EX LIQD: 1.0000 "application " | Freq: Once | CUTANEOUS | Status: DC

## 2014-04-23 MED ORDER — ONDANSETRON HCL 4 MG/2ML IJ SOLN
INTRAMUSCULAR | Status: DC | PRN
  Administered 2014-04-23: 4 mg via INTRAVENOUS

## 2014-04-23 MED ORDER — POLYMYXIN B SULFATE 500000 UNITS IJ SOLR
INTRAMUSCULAR | Status: DC | PRN
  Administered 2014-04-23: 500 mL

## 2014-04-23 MED ORDER — LACTATED RINGERS IV SOLN
INTRAVENOUS | Status: DC
  Administered 2014-04-23: 08:00:00 via INTRAVENOUS

## 2014-04-23 MED ORDER — HYDROMORPHONE HCL 1 MG/ML IJ SOLN
0.2500 mg | INTRAMUSCULAR | Status: DC | PRN
  Administered 2014-04-23 (×4): 0.5 mg via INTRAVENOUS

## 2014-04-23 MED ORDER — PROPOFOL 10 MG/ML IV BOLUS
INTRAVENOUS | Status: DC | PRN
  Administered 2014-04-23: 200 mg via INTRAVENOUS

## 2014-04-23 MED ORDER — INSULIN ASPART 100 UNIT/ML ~~LOC~~ SOLN
SUBCUTANEOUS | Status: AC
  Filled 2014-04-23: qty 1

## 2014-04-23 MED ORDER — CIPROFLOXACIN IN D5W 400 MG/200ML IV SOLN
400.0000 mg | INTRAVENOUS | Status: AC
  Administered 2014-04-23 (×2): 400 mg via INTRAVENOUS

## 2014-04-23 MED ORDER — FENTANYL CITRATE 0.05 MG/ML IJ SOLN
INTRAMUSCULAR | Status: DC | PRN
  Administered 2014-04-23: 100 ug via INTRAVENOUS
  Administered 2014-04-23 (×2): 50 ug via INTRAVENOUS

## 2014-04-23 MED ORDER — MIDAZOLAM HCL 5 MG/5ML IJ SOLN
INTRAMUSCULAR | Status: DC | PRN
  Administered 2014-04-23: 2 mg via INTRAVENOUS

## 2014-04-23 MED ORDER — MIDAZOLAM HCL 2 MG/2ML IJ SOLN
INTRAMUSCULAR | Status: AC
  Filled 2014-04-23: qty 2

## 2014-04-23 SURGICAL SUPPLY — 87 items
BAG DECANTER FOR FLEXI CONT (MISCELLANEOUS) ×2 IMPLANT
BANDAGE ELASTIC 3 VELCRO ST LF (GAUZE/BANDAGES/DRESSINGS) ×2 IMPLANT
BANDAGE ELASTIC 4 VELCRO ST LF (GAUZE/BANDAGES/DRESSINGS) ×2 IMPLANT
BANDAGE ELASTIC 6 VELCRO ST LF (GAUZE/BANDAGES/DRESSINGS) IMPLANT
BENZOIN TINCTURE PRP APPL 2/3 (GAUZE/BANDAGES/DRESSINGS) IMPLANT
BLADE HEX COATED 2.75 (ELECTRODE) ×2 IMPLANT
BLADE MINI RND TIP GREEN BEAV (BLADE) IMPLANT
BLADE SURG 10 STRL SS (BLADE) ×4 IMPLANT
BLADE SURG 15 STRL LF DISP TIS (BLADE) ×1 IMPLANT
BLADE SURG 15 STRL SS (BLADE) ×1
BNDG COHESIVE 4X5 TAN STRL (GAUZE/BANDAGES/DRESSINGS) IMPLANT
BNDG GAUZE ELAST 4 BULKY (GAUZE/BANDAGES/DRESSINGS) ×4 IMPLANT
CANISTER SUCT 1200ML W/VALVE (MISCELLANEOUS) IMPLANT
CANISTER SUCT 3000ML (MISCELLANEOUS) ×2 IMPLANT
CHLORAPREP W/TINT 26ML (MISCELLANEOUS) IMPLANT
CORDS BIPOLAR (ELECTRODE) IMPLANT
COVER BACK TABLE 60X90IN (DRAPES) ×2 IMPLANT
COVER MAYO STAND STRL (DRAPES) IMPLANT
DECANTER SPIKE VIAL GLASS SM (MISCELLANEOUS) IMPLANT
DRAIN CHANNEL 19F RND (DRAIN) IMPLANT
DRAIN PENROSE 1/2X12 LTX STRL (WOUND CARE) IMPLANT
DRAPE INCISE IOBAN 66X45 STRL (DRAPES) ×2 IMPLANT
DRAPE LAPAROSCOPIC ABDOMINAL (DRAPES) IMPLANT
DRAPE PED LAPAROTOMY (DRAPES) IMPLANT
DRAPE U-SHAPE 76X120 STRL (DRAPES) ×4 IMPLANT
DRSG ADAPTIC 3X8 NADH LF (GAUZE/BANDAGES/DRESSINGS) IMPLANT
DRSG EMULSION OIL 3X3 NADH (GAUZE/BANDAGES/DRESSINGS) IMPLANT
DRSG PAD ABDOMINAL 8X10 ST (GAUZE/BANDAGES/DRESSINGS) IMPLANT
ELECT NEEDLE TIP 2.8 STRL (NEEDLE) IMPLANT
ELECT REM PT RETURN 9FT ADLT (ELECTROSURGICAL) ×2
ELECTRODE REM PT RTRN 9FT ADLT (ELECTROSURGICAL) ×1 IMPLANT
EVACUATOR SILICONE 100CC (DRAIN) IMPLANT
GAUZE SPONGE 4X4 12PLY STRL (GAUZE/BANDAGES/DRESSINGS) ×2 IMPLANT
GAUZE XEROFORM 1X8 LF (GAUZE/BANDAGES/DRESSINGS) IMPLANT
GAUZE XEROFORM 5X9 LF (GAUZE/BANDAGES/DRESSINGS) IMPLANT
GLOVE BIO SURGEON STRL SZ 6.5 (GLOVE) ×2 IMPLANT
GLOVE BIOGEL PI IND STRL 7.0 (GLOVE) ×1 IMPLANT
GLOVE BIOGEL PI INDICATOR 7.0 (GLOVE) ×1
GLOVE ECLIPSE 6.5 STRL STRAW (GLOVE) ×2 IMPLANT
GOWN STRL REUS W/ TWL LRG LVL3 (GOWN DISPOSABLE) ×2 IMPLANT
GOWN STRL REUS W/TWL LRG LVL3 (GOWN DISPOSABLE) ×4
IV NS IRRIG 3000ML ARTHROMATIC (IV SOLUTION) IMPLANT
MANIFOLD NEPTUNE II (INSTRUMENTS) IMPLANT
MATRIX TISSUE MESHED 4X5 (Tissue) ×2 IMPLANT
NEEDLE 27GAX1X1/2 (NEEDLE) IMPLANT
NEEDLE HYPO 25X1 1.5 SAFETY (NEEDLE) IMPLANT
NS IRRIG 1000ML POUR BTL (IV SOLUTION) ×2 IMPLANT
PACK BASIN DAY SURGERY FS (CUSTOM PROCEDURE TRAY) ×2 IMPLANT
PADDING CAST ABS 3INX4YD NS (CAST SUPPLIES)
PADDING CAST ABS 4INX4YD NS (CAST SUPPLIES)
PADDING CAST ABS COTTON 3X4 (CAST SUPPLIES) IMPLANT
PADDING CAST ABS COTTON 4X4 ST (CAST SUPPLIES) IMPLANT
PENCIL BUTTON HOLSTER BLD 10FT (ELECTRODE) ×2 IMPLANT
PIN SAFETY STERILE (MISCELLANEOUS) IMPLANT
SHEET MEDIUM DRAPE 40X70 STRL (DRAPES) ×2 IMPLANT
SLEEVE SCD COMPRESS KNEE MED (MISCELLANEOUS) ×2 IMPLANT
SPLINT PLASTER CAST XFAST 3X15 (CAST SUPPLIES) IMPLANT
SPLINT PLASTER XTRA FASTSET 3X (CAST SUPPLIES)
SPONGE GAUZE 4X4 12PLY STER LF (GAUZE/BANDAGES/DRESSINGS) IMPLANT
SPONGE LAP 18X18 X RAY DECT (DISPOSABLE) ×2 IMPLANT
STAPLER VISISTAT 35W (STAPLE) IMPLANT
STOCKINETTE 4X48 STRL (DRAPES) IMPLANT
STOCKINETTE 6  STRL (DRAPES) ×1
STOCKINETTE 6 STRL (DRAPES) ×1 IMPLANT
STOCKINETTE IMPERVIOUS LG (DRAPES) IMPLANT
STRIP CLOSURE SKIN 1/2X4 (GAUZE/BANDAGES/DRESSINGS) IMPLANT
SUCTION FRAZIER TIP 10 FR DISP (SUCTIONS) IMPLANT
SURGILUBE 2OZ TUBE FLIPTOP (MISCELLANEOUS) IMPLANT
SUT MNCRL AB 4-0 PS2 18 (SUTURE) ×2 IMPLANT
SUT MON AB 3-0 SH 27 (SUTURE)
SUT MON AB 3-0 SH27 (SUTURE) IMPLANT
SUT SILK 3 0 PS 1 (SUTURE) IMPLANT
SUT VIC AB 3-0 FS2 27 (SUTURE) IMPLANT
SUT VIC AB 5-0 P-3 18X BRD (SUTURE) IMPLANT
SUT VIC AB 5-0 P3 18 (SUTURE)
SUT VIC AB 5-0 PS2 18 (SUTURE) ×4 IMPLANT
SUT VICRYL 4-0 PS2 18IN ABS (SUTURE) IMPLANT
SWAB COLLECTION DEVICE MRSA (MISCELLANEOUS) IMPLANT
SYR BULB IRRIGATION 50ML (SYRINGE) IMPLANT
SYR CONTROL 10ML LL (SYRINGE) IMPLANT
TAPE HYPAFIX 6X30 (GAUZE/BANDAGES/DRESSINGS) IMPLANT
TOWEL OR 17X24 6PK STRL BLUE (TOWEL DISPOSABLE) ×2 IMPLANT
TRAY DSU PREP LF (CUSTOM PROCEDURE TRAY) ×2 IMPLANT
TUBE ANAEROBIC SPECIMEN COL (MISCELLANEOUS) ×2 IMPLANT
TUBE CONNECTING 20X1/4 (TUBING) ×2 IMPLANT
UNDERPAD 30X30 INCONTINENT (UNDERPADS AND DIAPERS) ×2 IMPLANT
YANKAUER SUCT BULB TIP NO VENT (SUCTIONS) ×2 IMPLANT

## 2014-04-23 NOTE — Transfer of Care (Signed)
Immediate Anesthesia Transfer of Care Note  Patient: [name] [name]  Procedure(s) Performed: Procedure(s): IRRIGATION AND DEBRIDEMENT LOWER LEFT LEG WOUND WITH PLACEMENT OF INTEGRA AND VAC (Left) MINOR APPLICATION OF WOUND VAC (Left)  Patient Location: PACU  Anesthesia Type:General  Level of Consciousness: awake, alert  and oriented  Airway & Oxygen Therapy: Patient Spontanous Breathing and Patient connected to face mask oxygen  Post-op Assessment: Report given to PACU RN and Post -op Vital signs reviewed and stable  Post vital signs: Reviewed and stable  Complications: No apparent anesthesia complications

## 2014-04-23 NOTE — H&P (Signed)
[name] [name] is an 53 y.o. male.   Chief Complaint: left leg chronic ulcer HPI: The patient is a 53 yrs old wm here with his wife who is a Marine scientist for evaluation of his left leg. He states that there has been an ulcer on the area for the past 19 months. 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 Nothing has helped and it has gotten worse with time. It is 10 x 14 x 2 cm on the lateral aspect of the left leg and includes fascia and muscle. He is currently using triple antibiotic ointment on the area and is showering and cleaning it with dial soap. He has hyperlipidemia, diabetes, hypertension and is currently smoking. We will check his prealbumin today. He is being seen by Vascular - Dr. Oneida Alar and a saphenous ablation is planned for December. He has severe varicous veins, redness and swelling of the leg. The wound has fibrous tissue, exudate, white and black nonviable tissue.  Past Medical History  Diagnosis Date  . Anxiety   . Diabetes mellitus type II   . HTN (hypertension)   . Morbid obesity   . HLD (hyperlipidemia)   . Tobacco abuse   . Restless legs syndrome (RLS) 11/20/2012  . Wears glasses   . Snores   . Depression   . GERD (gastroesophageal reflux disease)     Past Surgical History  Procedure Laterality Date  . Kidney stone removal  2004  . Arm surgery Right 1969    Abscess excision and debridement at elbow  . Vasectomy  1994    Family History  Problem Relation Age of Onset  . Stroke Mother     multiple  . Leukemia Mother   . Pneumonia Mother   . Cancer Mother   . Diabetes Mother   . Diabetes Father   . Heart disease Father   . Hyperlipidemia Father   . Hypertension Father    Social History:  reports that he has been smoking Cigarettes.  He has a 37.5 pack-year smoking history. He has never used smokeless tobacco. He reports that he drinks alcohol. He reports that he does not use illicit  drugs.  Allergies:  Allergies  Allergen Reactions  . Penicillins     REACTION: unspecified    Medications Prior to Admission  Medication Sig Dispense Refill  . ECHINACEA PO Take by mouth daily.    Marland Kitchen glipiZIDE (GLUCOTROL XL) 10 MG 24 hr tablet Take 2 tablets (20 mg total) by mouth daily. 90 tablet 3  . glucosamine-chondroitin 500-400 MG tablet Take 1 tablet by mouth daily.      Marland Kitchen glucose blood (BAYER CONTOUR NEXT TEST) test strip Test two times daily or as  instructed 200 each 3  . lisinopril-hydrochlorothiazide (PRINZIDE,ZESTORETIC) 20-12.5 MG per tablet Take 2 tablets by mouth  once a day 180 tablet 3  . meloxicam (MOBIC) 15 MG tablet Take 1 tablet (15 mg total) by mouth daily. 30 tablet 5  . metFORMIN (GLUCOPHAGE-XR) 500 MG 24 hr tablet Take 4 tablets by mouth  daily with breakfast 360 tablet 3  . Multiple Vitamin (MULTIVITAMIN) tablet Take 1 tablet by mouth daily.      . pantoprazole (PROTONIX) 40 MG tablet Take 1 tablet (40 mg total) by mouth daily. 50 tablet 0  . PARoxetine (PAXIL) 20 MG tablet Take 1 tablet (20 mg total) by mouth every morning. 90 tablet 0  . pentoxifylline (TRENTAL) 400 MG CR tablet Take  400 mg by mouth 3 (three) times daily with meals.     . predniSONE (DELTASONE) 5 MG tablet Take 1 tablet (5 mg total) by mouth daily with breakfast. Taper steroids.  25 mg daily for 1 week then 20 mg daily for 1 week then 15 mg daily for one week then 10 mg weekly for one week then 5 mg daily for one week then discontinue 80 tablet 0  . saxagliptin HCl (ONGLYZA) 2.5 MG TABS tablet Take 1 tablet (2.5 mg total) by mouth daily. 30 tablet 0  . simvastatin (ZOCOR) 40 MG tablet Take 1 tablet by mouth at  bedtime 90 tablet 3  . vitamin C (ASCORBIC ACID) 500 MG tablet Take 500 mg by mouth daily.    Marland Kitchen aspirin 81 MG tablet Take 81 mg by mouth daily.      Marland Kitchen BAYER MICROLET LANCETS lancets Use to test twice daily or  as directed.  Dx: 250.70, 443.81 200 each 3  . doxycycline (VIBRAMYCIN) 100 MG  capsule Take 100 mg by mouth 2 (two) times daily.  0  . fish oil-omega-3 fatty acids 1000 MG capsule Take 1 g by mouth daily.      . Fluocinonide 0.1 % CREA   1  . gabapentin (NEURONTIN) 300 MG capsule     . HYDROcodone-acetaminophen (NORCO/VICODIN) 5-325 MG per tablet Take 1 tablet by mouth every 6 (six) hours as needed.  0  . oxyCODONE-acetaminophen (PERCOCET) 10-325 MG per tablet Take 1 tablet by mouth every 4 (four) hours as needed. 30 tablet 0  . predniSONE (DELTASONE) 10 MG tablet Take 30 mg by mouth daily.  0    Results for orders placed or performed during the hospital encounter of 04/23/14 (from the past 48 hour(s))  Basic metabolic panel     Status: Abnormal   Collection Time: 04/22/14  2:15 PM  Result Value Ref Range   Sodium 140 137 - 147 mEq/L   Potassium 4.3 3.7 - 5.3 mEq/L   Chloride 101 96 - 112 mEq/L   CO2 25 19 - 32 mEq/L   Glucose, Bld 111 (H) 70 - 99 mg/dL   BUN 23 6 - 23 mg/dL   Creatinine, Ser 0.87 0.50 - 1.35 mg/dL   Calcium 9.7 8.4 - 10.5 mg/dL   GFR calc non Af Amer >90 >90 mL/min   GFR calc Af Amer >90 >90 mL/min    Comment: (NOTE) The eGFR has been calculated using the CKD EPI equation. This calculation has not been validated in all clinical situations. eGFR's persistently <90 mL/min signify possible Chronic Kidney Disease.    Anion gap 14 5 - 15   No results found.  Review of Systems  Constitutional: Negative.   HENT: Negative.   Respiratory: Negative.   Cardiovascular: Negative.   Gastrointestinal: Negative.   Genitourinary: Negative.   Musculoskeletal: Positive for joint pain. Negative for back pain.  Skin: Negative.   Neurological: Negative.   Psychiatric/Behavioral: Negative.     Blood pressure 155/70, pulse 114, temperature 98.5 F (36.9 C), temperature source Oral, resp. rate 24, height 6' (1.829 m), weight 161.027 kg (355 lb), SpO2 98 %. Physical Exam  Constitutional: He is oriented to person, place, and time. He appears  well-developed and well-nourished.  HENT:  Head: Normocephalic and atraumatic.  Eyes: Conjunctivae and EOM are normal. Pupils are equal, round, and reactive to light.  Neck: Normal range of motion.  Cardiovascular: Normal rate.   Respiratory: Effort normal.  GI: Soft.  Neurological:  He is alert and oriented to person, place, and time.  Skin: There is erythema.  Psychiatric: He has a normal mood and affect. His behavior is normal. Judgment and thought content normal.     Assessment/Plan Debridement of left leg ulcer with possible VAC and Acell placement.  SANGER,Jerilynn Feldmeier 04/23/2014, 7:39 AM

## 2014-04-23 NOTE — Anesthesia Postprocedure Evaluation (Signed)
  Anesthesia Post-op Note  Patient: [name] LauraHerbert Wolter  Procedure(s) Performed: Procedure(s): IRRIGATION AND DEBRIDEMENT LOWER LEFT LEG WOUND WITH PLACEMENT OF INTEGRA AND VAC (Left) MINOR APPLICATION OF WOUND VAC (Left)  Patient Location: PACU  Anesthesia Type:General  Level of Consciousness: awake and alert   Airway and Oxygen Therapy: Patient Spontanous Breathing  Post-op Pain: moderate  Post-op Assessment: Post-op Vital signs reviewed, Patient's Cardiovascular Status Stable and Respiratory Function Stable  Post-op Vital Signs: Reviewed  Filed Vitals:   04/23/14 1117  BP: 168/82  Pulse: 106  Temp: 36.8 C  Resp: 16    Complications: No apparent anesthesia complications

## 2014-04-23 NOTE — Anesthesia Preprocedure Evaluation (Signed)
Anesthesia Evaluation  Patient identified by MRN, date of birth, ID band Patient awake    Reviewed: Allergy & Precautions, H&P , NPO status , Patient's Chart, lab work & pertinent test results  Airway Mallampati: II  TM Distance: >3 FB Neck ROM: Full    Dental no notable dental hx. (+) Teeth Intact, Dental Advisory Given   Pulmonary Current Smoker,  breath sounds clear to auscultation  Pulmonary exam normal       Cardiovascular hypertension, Pt. on medications Rhythm:Regular Rate:Normal     Neuro/Psych Anxiety Depression negative neurological ROS     GI/Hepatic Neg liver ROS, GERD-  Medicated and Controlled,  Endo/Other  diabetes, Type 2, Oral Hypoglycemic AgentsMorbid obesity  Renal/GU negative Renal ROS  negative genitourinary   Musculoskeletal   Abdominal   Peds  Hematology negative hematology ROS (+)   Anesthesia Other Findings   Reproductive/Obstetrics negative OB ROS                             Anesthesia Physical Anesthesia Plan  ASA: III  Anesthesia Plan: General   Post-op Pain Management:    Induction: Intravenous  Airway Management Planned: LMA  Additional Equipment:   Intra-op Plan:   Post-operative Plan: Extubation in OR  Informed Consent: I have reviewed the patients History and Physical, chart, labs and discussed the procedure including the risks, benefits and alternatives for the proposed anesthesia with the patient or authorized representative who has indicated his/her understanding and acceptance.   Dental advisory given  Plan Discussed with: CRNA  Anesthesia Plan Comments:         Anesthesia Quick Evaluation

## 2014-04-23 NOTE — Discharge Instructions (Signed)

## 2014-04-23 NOTE — Brief Op Note (Signed)
04/23/2014  9:35 AM  PATIENT:  [name] [name]  53 y.o. male  PRE-OPERATIVE DIAGNOSIS:  LOWER LEFT LEG ULCER  POST-OPERATIVE DIAGNOSIS:  Lower Leg Ulcer on the Left   PROCEDURE:  Procedure(s): IRRIGATION AND DEBRIDEMENT LOWER LEFT LEG WOUND WITH PLACEMENT OF INTEGRA AND VAC (Left) MINOR APPLICATION OF WOUND VAC (Left)  SURGEON:  Surgeon(s) and Role:    * Vane Yapp Sanger, DO - Primary  PHYSICIAN ASSISTANT: none  ASSISTANTS: none   ANESTHESIA:   general  EBL:  Total I/O In: 250 [I.V.:250] Out: -   BLOOD ADMINISTERED:none  DRAINS: none   LOCAL MEDICATIONS USED:  NONE  SPECIMEN:  No Specimen  DISPOSITION OF SPECIMEN:  N/A  COUNTS:  YES  TOURNIQUET:  * No tourniquets in log *  DICTATION: .Dragon Dictation  PLAN OF CARE: Discharge to home after PACU  PATIENT DISPOSITION:  PACU - hemodynamically stable.   Delay start of Pharmacological VTE agent (>24hrs) due to surgical blood loss or risk of bleeding: no

## 2014-04-23 NOTE — Anesthesia Procedure Notes (Signed)
Procedure Name: LMA Insertion Date/Time: 04/23/2014 8:39 AM Performed by: Zenia ResidesPAYNE, Venise Ellingwood D Pre-anesthesia Checklist: Patient identified, Emergency Drugs available, Suction available and Patient being monitored Patient Re-evaluated:Patient Re-evaluated prior to inductionOxygen Delivery Method: Circle System Utilized Preoxygenation: Pre-oxygenation with 100% oxygen Intubation Type: IV induction Ventilation: Mask ventilation without difficulty LMA: LMA with gastric port inserted LMA Size: 5.0 Number of attempts: 2 Airway Equipment and Method: bite block Placement Confirmation: positive ETCO2 Tube secured with: Tape Dental Injury: Teeth and Oropharynx as per pre-operative assessment

## 2014-04-23 NOTE — Op Note (Signed)
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  SURGEON: Claire Sanger, DO  ASSISTANT: none  ANESTHESIA:  General.   COMPLICATIONS: None.   INDICATIONS FOR PROCEDURE:  The patient, [name] [name] is a 53 y.o. male born on 30-May-1961, is here for treatment of a chronic left leg ulcer that he has been dealing with for over one year. MRN: [id number]  CONSENT:  Informed consent was obtained directly from the patient. Risks, benefits and alternatives were fully discussed. Specific risks including but not limited to bleeding, infection, hematoma, seroma, scarring, pain, infection, contracture, asymmetry, wound healing problems, and need for further surgery were all discussed. The patient did have an ample opportunity to have questions answered to satisfaction.   DESCRIPTION OF PROCEDURE:  The patient was taken to the operating room. SCDs were placed and IV antibiotics were given. The patient's operative site was prepped and draped in a sterile fashion. A time out was performed and all information was confirmed to be correct.  General anesthesia was administered.  The #10 blade was used to debride the skin and subcutaneous tissue over the wound (12 x 15 x 3 cm).  The area was irrigated with antibiotic solution. The integra sheet (10 x 15 cm) was placed on the area and secured with 5-0 Vicryl.  The VAC was placed and there was an excellent seal.  The patient tolerated the procedure well.  There were no complications. The patient was allowed to wake from anesthesia, extubated and taken to the recovery room in satisfactory condition.

## 2014-04-25 ENCOUNTER — Other Ambulatory Visit (HOSPITAL_COMMUNITY): Payer: BC Managed Care – PPO

## 2014-04-28 ENCOUNTER — Encounter (HOSPITAL_COMMUNITY): Admission: RE | Payer: Self-pay | Source: Ambulatory Visit

## 2014-04-28 ENCOUNTER — Ambulatory Visit (HOSPITAL_COMMUNITY)
Admission: RE | Admit: 2014-04-28 | Payer: BC Managed Care – PPO | Source: Ambulatory Visit | Admitting: Vascular Surgery

## 2014-04-28 ENCOUNTER — Encounter (HOSPITAL_BASED_OUTPATIENT_CLINIC_OR_DEPARTMENT_OTHER): Payer: Self-pay | Admitting: Plastic Surgery

## 2014-04-28 ENCOUNTER — Other Ambulatory Visit: Payer: Self-pay | Admitting: *Deleted

## 2014-04-28 DIAGNOSIS — L97929 Non-pressure chronic ulcer of unspecified part of left lower leg with unspecified severity: Principal | ICD-10-CM

## 2014-04-28 DIAGNOSIS — I83029 Varicose veins of left lower extremity with ulcer of unspecified site: Secondary | ICD-10-CM

## 2014-04-28 SURGERY — IRRIGATION AND DEBRIDEMENT EXTREMITY
Anesthesia: Choice | Laterality: Left

## 2014-04-29 ENCOUNTER — Other Ambulatory Visit: Payer: Self-pay | Admitting: Family Medicine

## 2014-04-29 MED ORDER — OXYCODONE-ACETAMINOPHEN 10-325 MG PO TABS: 1.0000 | ORAL_TABLET | ORAL | Status: DC | PRN

## 2014-04-29 NOTE — Telephone Encounter (Signed)
Has leg ulcer. No past drug eval done. Using more than 1 tabs a day, but allowed per Dr. Salena Saner instruction.  Will refil.

## 2014-04-29 NOTE — Telephone Encounter (Signed)
Last office visit 03/24/2014.  Last refilled 04/14/2014 for #30 with no refills.  Ok to refill?

## 2014-04-30 NOTE — Telephone Encounter (Signed)
[name] [name] notified prescription is ready to be picked up at front desk. 

## 2014-05-05 ENCOUNTER — Ambulatory Visit (INDEPENDENT_AMBULATORY_CARE_PROVIDER_SITE_OTHER): Payer: BC Managed Care – PPO | Admitting: Vascular Surgery

## 2014-05-05 ENCOUNTER — Encounter: Payer: Self-pay | Admitting: Vascular Surgery

## 2014-05-05 ENCOUNTER — Encounter (HOSPITAL_BASED_OUTPATIENT_CLINIC_OR_DEPARTMENT_OTHER): Payer: BC Managed Care – PPO | Attending: Plastic Surgery

## 2014-05-05 VITALS — BP 97/57 | HR 99 | Resp 18 | Ht 72.0 in | Wt 355.0 lb

## 2014-05-05 DIAGNOSIS — I83222 Varicose veins of left lower extremity with both ulcer of calf and inflammation: Secondary | ICD-10-CM

## 2014-05-05 DIAGNOSIS — I83225 Varicose veins of left lower extremity with both ulcer other part of foot and inflammation: Secondary | ICD-10-CM

## 2014-05-05 DIAGNOSIS — I83228 Varicose veins of left lower extremity with both ulcer of other part of lower extremity and inflammation: Secondary | ICD-10-CM

## 2014-05-05 DIAGNOSIS — I872 Venous insufficiency (chronic) (peripheral): Secondary | ICD-10-CM | POA: Diagnosis not present

## 2014-05-05 DIAGNOSIS — L97919 Non-pressure chronic ulcer of unspecified part of right lower leg with unspecified severity: Secondary | ICD-10-CM | POA: Diagnosis not present

## 2014-05-05 DIAGNOSIS — I83224 Varicose veins of left lower extremity with both ulcer of heel and midfoot and inflammation: Secondary | ICD-10-CM

## 2014-05-05 DIAGNOSIS — I83223 Varicose veins of left lower extremity with both ulcer of ankle and inflammation: Secondary | ICD-10-CM

## 2014-05-05 DIAGNOSIS — I83229 Varicose veins of left lower extremity with both ulcer of unspecified site and inflammation: Secondary | ICD-10-CM

## 2014-05-05 DIAGNOSIS — L97329 Non-pressure chronic ulcer of left ankle with unspecified severity: Principal | ICD-10-CM

## 2014-05-05 DIAGNOSIS — I83221 Varicose veins of left lower extremity with both ulcer of thigh and inflammation: Secondary | ICD-10-CM

## 2014-05-05 DIAGNOSIS — I83023 Varicose veins of left lower extremity with ulcer of ankle: Secondary | ICD-10-CM

## 2014-05-05 NOTE — Progress Notes (Signed)
[facility]  NAME:  Joen LauraSAWYER, Bailey              ACCOUNT NO.:  [id number]  MEDICAL RECORD NO.:  [id number]      DATE OF BIRTH:  05-Mar-1961  PHYSICIAN:  Wayland Denislaire Sanger, DO       VISIT DATE:  05/05/2014                                  OFFICE VISIT   The patient is a 53 year old male, who is here for followup on his left leg chronic venous insufficiency ulcer.  He underwent debridement with Integra placement which is actually shown some signs of improvement with granulation tissue formation underneath.  There has been no change in his medications or social history.  REVIEW OF SYSTEMS:  Otherwise negative.  Although he does have a laser ablation planned for later today.  On exam, he is alert, oriented, cooperative, not in any acute distress. He is overall looking better than he did even on Friday.  His breathing is unlabored.  His heart rate is regular.  His wound is showing remarkable signs of improvement not great overall but showing signs of improvement.  So, we will continue with the VAC.  Plan on going back to the OR for placement of more Integra and we will see him back in 1 week.     Wayland Denislaire Sanger, DO     CS/MEDQ  D:  05/05/2014  T:  05/05/2014  Job:  [id number]

## 2014-05-05 NOTE — Progress Notes (Signed)
Subjective:     Patient ID: [name] [name], male   DOB: 10-06-1960, 53 y.o.   MRN: [id number]  HPI this 53 year old male had laser ablation of the left great saphenous vein from the knee to near the saphenofemoral junction performed under local tumescent anesthesia. He has had a venous stasis ulcer in the left leg for the past 19 months. He tolerated the procedure well. A total of 2200 J of energy was utilized.   Review of Systems     Objective:   Physical Exam BP 97/57 mmHg  Pulse 99  Resp 18  Ht 6' (1.829 m)  Wt 355 lb (161.027 kg)  BMI 48.14 kg/m2       Assessment:     Well-tolerated laser ablation left great saphenous vein from knee to saphenofemoral junction performed under local tumescent anesthesia for persistent nonhealing ulceration left leg    Plan:     Return in one week for venous duplex exam to confirm closure left great saphenous vein and will continue weekly treatment at wound center

## 2014-05-05 NOTE — Progress Notes (Signed)
   Laser Ablation Procedure      Date: 05/05/2014    [name] DOB:[birth year]  Consent signed: Yes  Surgeon:J.D. Hart RochesterLawson  Procedure: Laser Ablation: left Greater Saphenous Vein  BP 97/57 mmHg  Pulse 99  Resp 18  Ht 6' (1.829 m)  Wt 355 lb (161.027 kg)  BMI 48.14 kg/m2  Start time: 3pm   End time: 4pm  Tumescent Anesthesia: 400 cc 0.9% NaCl with 50 cc Lidocaine HCL with 1% Epi and 15 cc 8.4% NaHCO3  Local Anesthesia: 6 cc Lidocaine HCL and NaHCO3 (ratio 2:1)  Pulsed mode: 15 watts, 500 ms delay. 1.0 duration Total energy: 2212, total pulses: 148, total time: 2:28       Patient tolerated procedure well: Yes  Notes:   Description of Procedure:  After marking the course of the secondary varicosities, the patient was placed on the operating table in the supine position, and the left leg was prepped and draped in sterile fashion.   Local anesthetic was administered and under ultrasound guidance the saphenous vein was accessed with a micro needle and guide wire; then the mirco puncture sheath was place.  A guide wire was inserted saphenofemoral junction , followed by a 5 french sheath.  The position of the sheath and then the laser fiber below the junction was confirmed using the ultrasound.  Tumescent anesthesia was administered along the course of the saphenous vein using ultrasound guidance. The patient was placed in Trendelenburg position and protective laser glasses were placed on patient and staff, and the laser was fired at 15 watt pulsed mode advancing 1-2 mm per sec for a total of 2212 joules.     Steri strips were applied to the stab wounds and ABD pads and thigh high compression stockings were applied.  Ace wrap bandages were applied over the phlebectomy sites and at the top of the saphenofemoral junction. Blood loss was less than 15 cc.  The patient ambulated out of the operating room having tolerated the procedure well.

## 2014-05-06 ENCOUNTER — Telehealth: Payer: Self-pay | Admitting: *Deleted

## 2014-05-06 NOTE — Telephone Encounter (Signed)
Pt doing well. Not having much discomfort. Following all instructions. Reminded him of his fu appts.

## 2014-05-07 ENCOUNTER — Encounter: Payer: Self-pay | Admitting: Vascular Surgery

## 2014-05-08 ENCOUNTER — Other Ambulatory Visit: Payer: Self-pay | Admitting: Family Medicine

## 2014-05-08 MED ORDER — OXYCODONE-ACETAMINOPHEN 10-325 MG PO TABS: 1.0000 | ORAL_TABLET | ORAL | Status: DC | PRN

## 2014-05-08 NOTE — Telephone Encounter (Signed)
Mr. Poli notified prescription is ready to be picked up at front desk. 

## 2014-05-09 ENCOUNTER — Encounter: Payer: Self-pay | Admitting: Vascular Surgery

## 2014-05-12 ENCOUNTER — Encounter: Payer: Self-pay | Admitting: Vascular Surgery

## 2014-05-12 ENCOUNTER — Ambulatory Visit (HOSPITAL_COMMUNITY)
Admission: RE | Admit: 2014-05-12 | Discharge: 2014-05-12 | Disposition: A | Discharge status: Home / Self Care | Payer: BC Managed Care – PPO | Source: Ambulatory Visit | Attending: Vascular Surgery | Admitting: Vascular Surgery

## 2014-05-12 ENCOUNTER — Ambulatory Visit (INDEPENDENT_AMBULATORY_CARE_PROVIDER_SITE_OTHER): Payer: BC Managed Care – PPO | Admitting: Vascular Surgery

## 2014-05-12 VITALS — BP 100/65 | HR 104 | Resp 18 | Ht 72.0 in | Wt 350.0 lb

## 2014-05-12 DIAGNOSIS — L97929 Non-pressure chronic ulcer of unspecified part of left lower leg with unspecified severity: Secondary | ICD-10-CM

## 2014-05-12 DIAGNOSIS — I872 Venous insufficiency (chronic) (peripheral): Secondary | ICD-10-CM | POA: Diagnosis not present

## 2014-05-12 DIAGNOSIS — L97919 Non-pressure chronic ulcer of unspecified part of right lower leg with unspecified severity: Secondary | ICD-10-CM | POA: Diagnosis not present

## 2014-05-12 DIAGNOSIS — I83029 Varicose veins of left lower extremity with ulcer of unspecified site: Secondary | ICD-10-CM

## 2014-05-12 DIAGNOSIS — I83892 Varicose veins of left lower extremities with other complications: Secondary | ICD-10-CM

## 2014-05-12 DIAGNOSIS — I87312 Chronic venous hypertension (idiopathic) with ulcer of left lower extremity: Secondary | ICD-10-CM

## 2014-05-12 NOTE — Progress Notes (Signed)
Subjective:     Patient ID: [name] LauraHerbert [name], male   DOB: 1960/09/03, 53 y.o.   MRN: [id number]  HPI this 53 year old male returns today having undergone laser ablation left great saphenous vein last week. He has a chronic venous ulcer which is been present for 19 months. He goes to the wound center each week on Mondays. The dressing was changed today and apparently the wound  looks much better according to the patient and family. He also states that it feels better with less pain. He has a wound VAC in place and apparently there are plans to perform a skin graft after the first of the year by Dr. Kelly SplinterSanger Past Medical History  Diagnosis Date  . Anxiety   . Diabetes mellitus type II   . HTN (hypertension)   . Morbid obesity   . HLD (hyperlipidemia)   . Tobacco abuse   . Restless legs syndrome (RLS) 11/20/2012  . Wears glasses   . Snores   . Depression   . GERD (gastroesophageal reflux disease)     History  Substance Use Topics  . Smoking status: Current Every Day Smoker -- 0.50 packs/day for 75 years    Types: Cigarettes  . Smokeless tobacco: Never Used     Comment: one cigarette daily  . Alcohol Use: 0.0 oz/week    0 Not specified per week     Comment: rarely    Family History  Problem Relation Age of Onset  . Stroke Mother     multiple  . Leukemia Mother   . Pneumonia Mother   . Cancer Mother   . Diabetes Mother   . Diabetes Father   . Heart disease Father   . Hyperlipidemia Father   . Hypertension Father     Allergies  Allergen Reactions  . Betadine [Povidone Iodine]     Raised rash 04/23/14  . Penicillins     REACTION: unspecified    Current outpatient prescriptions: aspirin 81 MG tablet, Take 81 mg by mouth daily.  , Disp: , Rfl: ;  BAYER MICROLET LANCETS lancets, Use to test twice daily or  as directed.  Dx: 250.70, 443.81, Disp: 200 each, Rfl: 3;  doxycycline (VIBRAMYCIN) 100 MG capsule, Take 100 mg by mouth 2 (two) times daily., Disp: , Rfl: 0;  ECHINACEA PO,  Take by mouth daily., Disp: , Rfl:  fish oil-omega-3 fatty acids 1000 MG capsule, Take 1 g by mouth daily.  , Disp: , Rfl: ;  Fluocinonide 0.1 % CREA, , Disp: , Rfl: 1;  gabapentin (NEURONTIN) 300 MG capsule, , Disp: , Rfl: ;  glipiZIDE (GLUCOTROL XL) 10 MG 24 hr tablet, Take 2 tablets (20 mg total) by mouth daily., Disp: 90 tablet, Rfl: 3;  glucosamine-chondroitin 500-400 MG tablet, Take 1 tablet by mouth daily.  , Disp: , Rfl:  glucose blood (BAYER CONTOUR NEXT TEST) test strip, Test two times daily or as  instructed, Disp: 200 each, Rfl: 3;  HYDROcodone-acetaminophen (NORCO/VICODIN) 5-325 MG per tablet, Take 1 tablet by mouth every 6 (six) hours as needed., Disp: , Rfl: 0;  lisinopril-hydrochlorothiazide (PRINZIDE,ZESTORETIC) 20-12.5 MG per tablet, Take 2 tablets by mouth  once a day, Disp: 180 tablet, Rfl: 3 meloxicam (MOBIC) 15 MG tablet, Take 1 tablet (15 mg total) by mouth daily., Disp: 30 tablet, Rfl: 5;  metFORMIN (GLUCOPHAGE-XR) 500 MG 24 hr tablet, Take 4 tablets by mouth  daily with breakfast, Disp: 360 tablet, Rfl: 3;  Multiple Vitamin (MULTIVITAMIN) tablet, Take 1 tablet by mouth  daily.  , Disp: , Rfl: ;  oxyCODONE-acetaminophen (PERCOCET) 10-325 MG per tablet, Take 1 tablet by mouth every 4 (four) hours as needed., Disp: 50 tablet, Rfl: 0 pantoprazole (PROTONIX) 40 MG tablet, Take 1 tablet (40 mg total) by mouth daily., Disp: 50 tablet, Rfl: 0;  PARoxetine (PAXIL) 20 MG tablet, Take 1 tablet (20 mg total) by mouth every morning., Disp: 90 tablet, Rfl: 0;  pentoxifylline (TRENTAL) 400 MG CR tablet, Take 400 mg by mouth 3 (three) times daily with meals. , Disp: , Rfl: ;  predniSONE (DELTASONE) 10 MG tablet, Take 30 mg by mouth daily., Disp: , Rfl: 0 predniSONE (DELTASONE) 5 MG tablet, Take 1 tablet (5 mg total) by mouth daily with breakfast. Taper steroids.  25 mg daily for 1 week then 20 mg daily for 1 week then 15 mg daily for one week then 10 mg weekly for one week then 5 mg daily for one week  then discontinue, Disp: 80 tablet, Rfl: 0;  saxagliptin HCl (ONGLYZA) 2.5 MG TABS tablet, Take 1 tablet (2.5 mg total) by mouth daily., Disp: 30 tablet, Rfl: 0 simvastatin (ZOCOR) 40 MG tablet, Take 1 tablet by mouth at  bedtime, Disp: 90 tablet, Rfl: 3;  vitamin C (ASCORBIC ACID) 500 MG tablet, Take 500 mg by mouth daily., Disp: , Rfl:   BP 100/65 mmHg  Pulse 104  Resp 18  Ht 6' (1.829 m)  Wt 350 lb (158.759 kg)  BMI 47.46 kg/m2  Body mass index is 47.46 kg/(m^2).          Review of Systems denies chest pain, dyspnea on exertion, PND, orthopnea, hemoptysis.    Objective:   Physical Exam BP 100/65 mmHg  Pulse 104  Resp 18  Ht 6' (1.829 m)  Wt 350 lb (158.759 kg)  BMI 47.46 kg/m2  Gen. morbidly obese male no apparent distress alert and oriented 3 Lungs no rhonchi or wheezing Left leg with compression wrap up to just below the knee which was applied earlier today in the wound center. No ecchymosis surrounding laser ablation site from knee to inguinal area. A few varicosities noted on the anterolateral thigh.  Today I ordered a venous duplex exam of the left leg which I reviewed and interpreted. There is no DVT. The left great saphenous vein is totally closed up to near the saphenofemoral junction.     Assessment:     Successful laser ablation left great saphenous vein for gross reflux in patient with 19 month history of recalcitrant venous ulcer    Plan:     Patient will continue follow-up in the wound center and return to see us on when necessary basis

## 2014-05-13 ENCOUNTER — Encounter: Payer: Self-pay | Admitting: Family Medicine

## 2014-05-13 NOTE — Progress Notes (Signed)
[facility]  NAME:  [name] [name], [name] [name]              ACCOUNT NO.:  [id number]  MEDICAL RECORD NO.:  [id number]      DATE OF BIRTH:  Jan 20, 1961  PHYSICIAN:  Wayland Denislaire Sanger, DO       VISIT DATE:  05/12/2014                                  OFFICE VISIT   The patient is a 53 year old male who is here for followup on his left leg ulcer.  He is doing much better.  Overall, he is alert and oriented, cooperative, not in any distress.  There is no change in his medications.  He is still on the antibiotic which I refilled last week. He is using the VAC.  The Integra is incorporating nicely.  There is no sign of overt infection.  At this point, he had a Vascular Surgery last week and it seems to be helping.  The plan will be for more Integra placement in 2 weeks and we will see him back in 3 weeks.     Wayland Denislaire Sanger, DO     CS/MEDQ  D:  05/12/2014  T:  05/13/2014  Job:  [id number]

## 2014-05-26 ENCOUNTER — Encounter (HOSPITAL_BASED_OUTPATIENT_CLINIC_OR_DEPARTMENT_OTHER): Payer: Self-pay | Admitting: *Deleted

## 2014-05-26 ENCOUNTER — Other Ambulatory Visit: Payer: Self-pay | Admitting: Family Medicine

## 2014-05-26 NOTE — Progress Notes (Signed)
Pt was here 04/23/14-was cleared by dr crews problebly has sleep apnea-never tested Needs istat-ekg done 11/15

## 2014-05-27 ENCOUNTER — Other Ambulatory Visit: Payer: Self-pay | Admitting: Plastic Surgery

## 2014-05-27 DIAGNOSIS — L97923 Non-pressure chronic ulcer of unspecified part of left lower leg with necrosis of muscle: Secondary | ICD-10-CM

## 2014-05-29 ENCOUNTER — Ambulatory Visit (HOSPITAL_BASED_OUTPATIENT_CLINIC_OR_DEPARTMENT_OTHER): Payer: BC Managed Care – PPO | Admitting: Anesthesiology

## 2014-05-29 ENCOUNTER — Encounter (HOSPITAL_BASED_OUTPATIENT_CLINIC_OR_DEPARTMENT_OTHER): Payer: Self-pay | Admitting: *Deleted

## 2014-05-29 ENCOUNTER — Encounter (HOSPITAL_BASED_OUTPATIENT_CLINIC_OR_DEPARTMENT_OTHER)
Admission: RE | Disposition: A | Discharge status: Home / Self Care | Payer: Self-pay | Source: Ambulatory Visit | Attending: Plastic Surgery

## 2014-05-29 ENCOUNTER — Ambulatory Visit (HOSPITAL_BASED_OUTPATIENT_CLINIC_OR_DEPARTMENT_OTHER)
Admission: RE | Admit: 2014-05-29 | Discharge: 2014-05-29 | Disposition: A | Discharge status: Home / Self Care | Payer: BC Managed Care – PPO | Source: Ambulatory Visit | Attending: Plastic Surgery | Admitting: Plastic Surgery

## 2014-05-29 DIAGNOSIS — L97929 Non-pressure chronic ulcer of unspecified part of left lower leg with unspecified severity: Secondary | ICD-10-CM | POA: Diagnosis present

## 2014-05-29 DIAGNOSIS — L97923 Non-pressure chronic ulcer of unspecified part of left lower leg with necrosis of muscle: Secondary | ICD-10-CM | POA: Diagnosis not present

## 2014-05-29 HISTORY — PX: I & D EXTREMITY: SHX5045

## 2014-05-29 LAB — POCT I-STAT, CHEM 8
BUN: 18 mg/dL (ref 6–23)
CHLORIDE: 102 meq/L (ref 96–112)
Calcium, Ion: 1.18 mmol/L (ref 1.12–1.23)
Creatinine, Ser: 0.9 mg/dL (ref 0.50–1.35)
Glucose, Bld: 152 mg/dL — ABNORMAL HIGH (ref 70–99)
HEMATOCRIT: 36 % — AB (ref 39.0–52.0)
HEMOGLOBIN: 12.2 g/dL — AB (ref 13.0–17.0)
POTASSIUM: 3.6 mmol/L (ref 3.5–5.1)
SODIUM: 140 mmol/L (ref 135–145)
TCO2: 21 mmol/L (ref 0–100)

## 2014-05-29 LAB — GLUCOSE, CAPILLARY: GLUCOSE-CAPILLARY: 150 mg/dL — AB (ref 70–99)

## 2014-05-29 SURGERY — IRRIGATION AND DEBRIDEMENT EXTREMITY
Anesthesia: General | Site: Leg Lower | Laterality: Left

## 2014-05-29 MED ORDER — MIDAZOLAM HCL 2 MG/2ML IJ SOLN: 1.0000 mg | INTRAMUSCULAR | Status: DC | PRN

## 2014-05-29 MED ORDER — MIDAZOLAM HCL 5 MG/5ML IJ SOLN
INTRAMUSCULAR | Status: DC | PRN
Start: 2014-05-29 — End: 2014-05-29
  Administered 2014-05-29: 2 mg via INTRAVENOUS

## 2014-05-29 MED ORDER — DEXAMETHASONE SODIUM PHOSPHATE 4 MG/ML IJ SOLN
INTRAMUSCULAR | Status: DC | PRN
  Administered 2014-05-29: 10 mg via INTRAVENOUS

## 2014-05-29 MED ORDER — FENTANYL CITRATE 0.05 MG/ML IJ SOLN: 50.0000 ug | INTRAMUSCULAR | Status: DC | PRN

## 2014-05-29 MED ORDER — LACTATED RINGERS IV SOLN
INTRAVENOUS | Status: DC
  Administered 2014-05-29 (×2): via INTRAVENOUS

## 2014-05-29 MED ORDER — FENTANYL CITRATE 0.05 MG/ML IJ SOLN
INTRAMUSCULAR | Status: DC | PRN
  Administered 2014-05-29 (×2): 50 ug via INTRAVENOUS

## 2014-05-29 MED ORDER — PHENYLEPHRINE HCL 10 MG/ML IJ SOLN
INTRAMUSCULAR | Status: DC | PRN
  Administered 2014-05-29 (×2): 40 ug via INTRAVENOUS

## 2014-05-29 MED ORDER — FENTANYL CITRATE 0.05 MG/ML IJ SOLN
INTRAMUSCULAR | Status: AC
  Filled 2014-05-29: qty 2

## 2014-05-29 MED ORDER — MIDAZOLAM HCL 2 MG/2ML IJ SOLN
INTRAMUSCULAR | Status: AC
  Filled 2014-05-29: qty 2

## 2014-05-29 MED ORDER — ONDANSETRON HCL 4 MG/2ML IJ SOLN: 4.0000 mg | Freq: Four times a day (QID) | INTRAMUSCULAR | Status: DC | PRN

## 2014-05-29 MED ORDER — PROPOFOL 10 MG/ML IV BOLUS
INTRAVENOUS | Status: DC | PRN
  Administered 2014-05-29: 300 mg via INTRAVENOUS

## 2014-05-29 MED ORDER — CIPROFLOXACIN IN D5W 400 MG/200ML IV SOLN: 400.0000 mg | INTRAVENOUS | Status: DC

## 2014-05-29 MED ORDER — OXYCODONE HCL 5 MG PO TABS
ORAL_TABLET | ORAL | Status: AC
  Filled 2014-05-29: qty 1

## 2014-05-29 MED ORDER — FENTANYL CITRATE 0.05 MG/ML IJ SOLN
25.0000 ug | INTRAMUSCULAR | Status: DC | PRN
  Administered 2014-05-29: 25 ug via INTRAVENOUS
  Administered 2014-05-29: 50 ug via INTRAVENOUS

## 2014-05-29 MED ORDER — LIDOCAINE HCL (CARDIAC) 20 MG/ML IV SOLN
INTRAVENOUS | Status: DC | PRN
  Administered 2014-05-29: 50 mg via INTRAVENOUS

## 2014-05-29 MED ORDER — FENTANYL CITRATE 0.05 MG/ML IJ SOLN
INTRAMUSCULAR | Status: AC
  Filled 2014-05-29: qty 6

## 2014-05-29 MED ORDER — CIPROFLOXACIN IN D5W 400 MG/200ML IV SOLN
INTRAVENOUS | Status: AC
  Filled 2014-05-29: qty 200

## 2014-05-29 MED ORDER — SODIUM CHLORIDE 0.9 % IR SOLN
Status: DC | PRN
  Administered 2014-05-29: 500 mL

## 2014-05-29 MED ORDER — OXYCODONE HCL 5 MG PO TABS
5.0000 mg | ORAL_TABLET | Freq: Once | ORAL | Status: AC
  Administered 2014-05-29: 5 mg via ORAL

## 2014-05-29 SURGICAL SUPPLY — 85 items
BAG DECANTER FOR FLEXI CONT (MISCELLANEOUS) ×2 IMPLANT
BANDAGE ELASTIC 3 VELCRO ST LF (GAUZE/BANDAGES/DRESSINGS) IMPLANT
BANDAGE ELASTIC 4 VELCRO ST LF (GAUZE/BANDAGES/DRESSINGS) ×4 IMPLANT
BANDAGE ELASTIC 6 VELCRO ST LF (GAUZE/BANDAGES/DRESSINGS) IMPLANT
BENZOIN TINCTURE PRP APPL 2/3 (GAUZE/BANDAGES/DRESSINGS) IMPLANT
BLADE HEX COATED 2.75 (ELECTRODE) ×2 IMPLANT
BLADE MINI RND TIP GREEN BEAV (BLADE) IMPLANT
BLADE SURG 10 STRL SS (BLADE) IMPLANT
BLADE SURG 15 STRL LF DISP TIS (BLADE) ×1 IMPLANT
BLADE SURG 15 STRL SS (BLADE) ×1
BNDG COHESIVE 1X5 TAN STRL LF (GAUZE/BANDAGES/DRESSINGS) IMPLANT
BNDG COHESIVE 4X5 TAN STRL (GAUZE/BANDAGES/DRESSINGS) IMPLANT
BNDG ESMARK 4X9 LF (GAUZE/BANDAGES/DRESSINGS) IMPLANT
BNDG GAUZE ELAST 4 BULKY (GAUZE/BANDAGES/DRESSINGS) ×2 IMPLANT
CANISTER SUCT 1200ML W/VALVE (MISCELLANEOUS) ×2 IMPLANT
CANISTER SUCT 3000ML (MISCELLANEOUS) IMPLANT
CHLORAPREP W/TINT 26ML (MISCELLANEOUS) IMPLANT
CORDS BIPOLAR (ELECTRODE) IMPLANT
COVER BACK TABLE 60X90IN (DRAPES) ×2 IMPLANT
COVER MAYO STAND STRL (DRAPES) ×2 IMPLANT
DECANTER SPIKE VIAL GLASS SM (MISCELLANEOUS) IMPLANT
DRAIN PENROSE 1/2X12 LTX STRL (WOUND CARE) IMPLANT
DRAPE EXTREMITY T 121X128X90 (DRAPE) IMPLANT
DRAPE INCISE IOBAN 66X45 STRL (DRAPES) ×4 IMPLANT
DRAPE U-SHAPE 76X120 STRL (DRAPES) ×2 IMPLANT
DRSG ADAPTIC 3X8 NADH LF (GAUZE/BANDAGES/DRESSINGS) IMPLANT
DRSG EMULSION OIL 3X3 NADH (GAUZE/BANDAGES/DRESSINGS) IMPLANT
DRSG PAD ABDOMINAL 8X10 ST (GAUZE/BANDAGES/DRESSINGS) IMPLANT
ELECT NEEDLE TIP 2.8 STRL (NEEDLE) IMPLANT
ELECT REM PT RETURN 9FT ADLT (ELECTROSURGICAL) ×2
ELECTRODE REM PT RTRN 9FT ADLT (ELECTROSURGICAL) ×1 IMPLANT
GAUZE SPONGE 4X4 12PLY STRL (GAUZE/BANDAGES/DRESSINGS) IMPLANT
GAUZE XEROFORM 1X8 LF (GAUZE/BANDAGES/DRESSINGS) IMPLANT
GAUZE XEROFORM 5X9 LF (GAUZE/BANDAGES/DRESSINGS) IMPLANT
GLOVE BIO SURGEON STRL SZ 6.5 (GLOVE) IMPLANT
GLOVE SURG SS PI 6.5 STRL IVOR (GLOVE) ×6 IMPLANT
GLOVE SURG SS PI 7.0 STRL IVOR (GLOVE) ×2 IMPLANT
GLOVE SURG SS PI 8.5 STRL IVOR (GLOVE) ×1
GLOVE SURG SS PI 8.5 STRL STRW (GLOVE) ×1 IMPLANT
GOWN STRL REUS W/ TWL LRG LVL3 (GOWN DISPOSABLE) ×2 IMPLANT
GOWN STRL REUS W/TWL LRG LVL3 (GOWN DISPOSABLE) ×2
IV NS IRRIG 3000ML ARTHROMATIC (IV SOLUTION) IMPLANT
MANIFOLD NEPTUNE II (INSTRUMENTS) IMPLANT
MATRIX TISSUE MESHED BI 4X10 (Tissue) ×2 IMPLANT
NEEDLE HYPO 30GX1 BEV (NEEDLE) IMPLANT
NEEDLE PRECISIONGLIDE 27X1.5 (NEEDLE) IMPLANT
NS IRRIG 1000ML POUR BTL (IV SOLUTION) ×2 IMPLANT
PACK BASIN DAY SURGERY FS (CUSTOM PROCEDURE TRAY) ×2 IMPLANT
PADDING CAST ABS 3INX4YD NS (CAST SUPPLIES)
PADDING CAST ABS 4INX4YD NS (CAST SUPPLIES)
PADDING CAST ABS COTTON 3X4 (CAST SUPPLIES) IMPLANT
PADDING CAST ABS COTTON 4X4 ST (CAST SUPPLIES) IMPLANT
PENCIL BUTTON HOLSTER BLD 10FT (ELECTRODE) ×2 IMPLANT
SHEET MEDIUM DRAPE 40X70 STRL (DRAPES) ×2 IMPLANT
SLEEVE SCD COMPRESS KNEE MED (MISCELLANEOUS) IMPLANT
SPLINT PLASTER CAST XFAST 3X15 (CAST SUPPLIES) IMPLANT
SPLINT PLASTER XTRA FASTSET 3X (CAST SUPPLIES)
SPONGE GAUZE 4X4 12PLY STER LF (GAUZE/BANDAGES/DRESSINGS) IMPLANT
SPONGE LAP 18X18 X RAY DECT (DISPOSABLE) ×2 IMPLANT
SPONGE LAP 4X18 X RAY DECT (DISPOSABLE) IMPLANT
STAPLER VISISTAT 35W (STAPLE) ×2 IMPLANT
STOCKINETTE 4X48 STRL (DRAPES) IMPLANT
STOCKINETTE 6  STRL (DRAPES) ×1
STOCKINETTE 6 STRL (DRAPES) ×1 IMPLANT
STOCKINETTE IMPERVIOUS LG (DRAPES) IMPLANT
STRIP CLOSURE SKIN 1/2X4 (GAUZE/BANDAGES/DRESSINGS) IMPLANT
SUCTION FRAZIER TIP 10 FR DISP (SUCTIONS) IMPLANT
SURGILUBE 2OZ TUBE FLIPTOP (MISCELLANEOUS) IMPLANT
SUT ETHILON 3 0 PS 1 (SUTURE) IMPLANT
SUT ETHILON 4 0 P 3 18 (SUTURE) IMPLANT
SUT ETHILON 5 0 PS 2 18 (SUTURE) IMPLANT
SUT PROLENE 3 0 PS 2 (SUTURE) IMPLANT
SUT SILK 3 0 PS 1 (SUTURE) IMPLANT
SUT VIC AB 3-0 FS2 27 (SUTURE) IMPLANT
SUT VIC AB 5-0 P-3 18X BRD (SUTURE) IMPLANT
SUT VIC AB 5-0 P3 18 (SUTURE)
SUT VIC AB 5-0 PS2 18 (SUTURE) ×8 IMPLANT
SYR BULB IRRIGATION 50ML (SYRINGE) ×2 IMPLANT
SYR CONTROL 10ML LL (SYRINGE) IMPLANT
TAPE HYPAFIX 6X30 (GAUZE/BANDAGES/DRESSINGS) IMPLANT
TOWEL OR 17X24 6PK STRL BLUE (TOWEL DISPOSABLE) ×4 IMPLANT
TRAY DSU PREP LF (CUSTOM PROCEDURE TRAY) ×2 IMPLANT
TUBE CONNECTING 20X1/4 (TUBING) ×2 IMPLANT
UNDERPAD 30X30 INCONTINENT (UNDERPADS AND DIAPERS) ×2 IMPLANT
YANKAUER SUCT BULB TIP NO VENT (SUCTIONS) ×2 IMPLANT

## 2014-05-29 NOTE — Interval H&P Note (Signed)
History and Physical Interval Note:  05/29/2014 12:26 PM  [name] [name]  has presented today for surgery, with the diagnosis of LEFT LEG ULCER  The various methods of treatment have been discussed with the patient and family. After consideration of risks, benefits and other options for treatment, the patient has consented to  Procedure(s): IRRIGATION AND DEBRIDEMENT OF LEFT LEG WOUND AND PLACEMENT OF  INTREGRA AND VAC (Left) as a surgical intervention .  The patient's history has been reviewed, patient examined, no change in status, stable for surgery.  I have reviewed the patient's chart and labs.  Questions were answered to the patient's satisfaction.     SANGER,Oluwaseyi Raffel

## 2014-05-29 NOTE — Brief Op Note (Signed)
05/29/2014  1:37 PM  PATIENT:  [name] [name] [name]  53 y.o. male  PRE-OPERATIVE DIAGNOSIS:  LEFT LEG ULCER  POST-OPERATIVE DIAGNOSIS:  LEFT LEG ULCER  PROCEDURE:  Procedure(s): IRRIGATION AND DEBRIDEMENT OF LEFT LEG WOUND AND PLACEMENT OF  INTEGRA  AND  VAC (Left)  SURGEON:  Surgeon(s) and Role:    * Emberlynn Riggan Sanger, DO - Primary  PHYSICIAN ASSISTANT: none  ASSISTANTS: none   ANESTHESIA:   general  EBL:  Total I/O In: 900 [I.V.:900] Out: -   BLOOD ADMINISTERED:none  DRAINS: none   LOCAL MEDICATIONS USED:  NONE  SPECIMEN:  No Specimen  DISPOSITION OF SPECIMEN:  N/A  COUNTS:  YES  TOURNIQUET:  * No tourniquets in log *  DICTATION: .Dragon Dictation  PLAN OF CARE: Discharge to home after PACU  PATIENT DISPOSITION:  PACU - hemodynamically stable.   Delay start of Pharmacological VTE agent (>24hrs) due to surgical blood loss or risk of bleeding: no

## 2014-05-29 NOTE — Anesthesia Procedure Notes (Signed)
Procedure Name: LMA Insertion Date/Time: 05/29/2014 1:06 PM Performed by: Genevieve NorlanderLINKA, Virga Haltiwanger L Pre-anesthesia Checklist: Patient identified, Emergency Drugs available, Suction available and Patient being monitored Patient Re-evaluated:Patient Re-evaluated prior to inductionOxygen Delivery Method: Circle System Utilized Preoxygenation: Pre-oxygenation with 100% oxygen Intubation Type: IV induction Ventilation: Mask ventilation without difficulty LMA: LMA inserted LMA Size: 5.0 Number of attempts: 1 Airway Equipment and Method: bite block Placement Confirmation: positive ETCO2 Tube secured with: Tape Dental Injury: Teeth and Oropharynx as per pre-operative assessment

## 2014-05-29 NOTE — Transfer of Care (Signed)
Immediate Anesthesia Transfer of Care Note  Patient: [name] [name]  Procedure(s) Performed: Procedure(s): IRRIGATION AND DEBRIDEMENT OF LEFT LEG WOUND AND PLACEMENT OF  INTEGRA  AND  VAC (Left)  Patient Location: PACU  Anesthesia Type:General  Level of Consciousness: awake, alert  and patient cooperative  Airway & Oxygen Therapy: Patient Spontanous Breathing and Patient connected to face mask oxygen  Post-op Assessment: Report given to PACU RN and Post -op Vital signs reviewed and stable  Post vital signs: Reviewed and stable  Complications: No apparent anesthesia complications

## 2014-05-29 NOTE — Anesthesia Preprocedure Evaluation (Signed)
Anesthesia Evaluation  Patient identified by MRN, date of birth, ID band Patient awake    Reviewed: Allergy & Precautions, H&P , NPO status , Patient's Chart, lab work & pertinent test results  Airway Mallampati: II   Neck ROM: full    Dental   Pulmonary Current Smoker,          Cardiovascular hypertension,     Neuro/Psych Anxiety Depression    GI/Hepatic GERD-  ,  Endo/Other  diabetes, Type obesity  Renal/GU      Musculoskeletal   Abdominal   Peds  Hematology   Anesthesia Other Findings   Reproductive/Obstetrics                             Anesthesia Physical Anesthesia Plan  ASA: III  Anesthesia Plan: General   Post-op Pain Management:    Induction: Intravenous  Airway Management Planned: LMA  Additional Equipment:   Intra-op Plan:   Post-operative Plan:   Informed Consent: I have reviewed the patients History and Physical, chart, labs and discussed the procedure including the risks, benefits and alternatives for the proposed anesthesia with the patient or authorized representative who has indicated his/her understanding and acceptance.     Plan Discussed with: CRNA, Anesthesiologist and Surgeon  Anesthesia Plan Comments:         Anesthesia Quick Evaluation

## 2014-05-29 NOTE — Discharge Instructions (Signed)
Continue VAC and don't change  Post Anesthesia Home Care Instructions  Activity: Get plenty of rest for the remainder of the day. A responsible adult should stay with you for 24 hours following the procedure.  For the next 24 hours, DO NOT: -Drive a car -Advertising copywriterperate machinery -Drink alcoholic beverages -Take any medication unless instructed by your physician -Make any legal decisions or sign important papers.  Meals: Start with liquid foods such as gelatin or soup. Progress to regular foods as tolerated. Avoid greasy, spicy, heavy foods. If nausea and/or vomiting occur, drink only clear liquids until the nausea and/or vomiting subsides. Call your physician if vomiting continues.  Special Instructions/Symptoms: Your throat may feel dry or sore from the anesthesia or the breathing tube placed in your throat during surgery. If this causes discomfort, gargle with warm salt water. The discomfort should disappear within 24 hours.

## 2014-05-29 NOTE — Op Note (Signed)
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   COMPLICATIONS: None.   INDICATIONS FOR PROCEDURE:  The patient, [name] [name] is a 53 y.o. male born on 09/02/1960, is here for treatment of a chronic left leg ulcer that he has been dealing with for over one year. MRN: [id number]  CONSENT:  Informed consent was obtained directly from the patient. Risks, benefits and alternatives were fully discussed. Specific risks including but not limited to bleeding, infection, hematoma, seroma, scarring, pain, infection, contracture, asymmetry, wound healing problems, and need for further surgery were all discussed. The patient did have an ample opportunity to have questions answered to satisfaction.   DESCRIPTION OF PROCEDURE:  The patient was taken to the operating room. SCDs were placed and IV antibiotics were given. The patient's operative site was prepped and draped in a sterile fashion. A time out was performed and all information was confirmed to be correct.  General anesthesia was administered.  The area was irrigated with antibiotic solution. The integra sheet (10 x 25 cm) was placed on the area and secured with 5-0 Vicryl.  The VAC was placed and there was an excellent seal.  The patient tolerated the procedure well.  There were no complications. The patient was allowed to wake from anesthesia, extubated and taken to the recovery room in satisfactory condition.

## 2014-05-29 NOTE — H&P (View-Only) (Signed)
[facility]  NAME:  [name], [name]              ACCOUNT NO.:  [id number]  MEDICAL RECORD NO.:  [id number]      DATE OF BIRTH:  04/01/1961  PHYSICIAN:  Claire Sanger, DO       VISIT DATE:  05/12/2014                                  OFFICE VISIT   The patient is a 53-year-old male who is here for followup on his left leg ulcer.  He is doing much better.  Overall, he is alert and oriented, cooperative, not in any distress.  There is no change in his medications.  He is still on the antibiotic which I refilled last week. He is using the VAC.  The Integra is incorporating nicely.  There is no sign of overt infection.  At this point, he had a Vascular Surgery last week and it seems to be helping.  The plan will be for more Integra placement in 2 weeks and we will see him back in 3 weeks.     Claire Sanger, DO     CS/MEDQ  D:  05/12/2014  T:  05/13/2014  Job:  [id number] 

## 2014-05-29 NOTE — Anesthesia Postprocedure Evaluation (Signed)
Anesthesia Post Note  Patient: [name] [name]  Procedure(s) Performed: Procedure(s) (LRB): IRRIGATION AND DEBRIDEMENT OF LEFT LEG WOUND AND PLACEMENT OF  INTEGRA  AND  VAC (Left)  Anesthesia type: General  Patient location: PACU  Post pain: Pain level controlled and Adequate analgesia  Post assessment: Post-op Vital signs reviewed, Patient's Cardiovascular Status Stable, Respiratory Function Stable, Patent Airway and Pain level controlled  Last Vitals:  Filed Vitals:   05/29/14 1430  BP: 141/66  Pulse: 92  Temp:   Resp: 21    Post vital signs: Reviewed and stable  Level of consciousness: awake, alert  and oriented  Complications: No apparent anesthesia complications

## 2014-05-30 ENCOUNTER — Other Ambulatory Visit: Payer: Self-pay | Admitting: Vascular Surgery

## 2014-06-02 ENCOUNTER — Encounter (HOSPITAL_BASED_OUTPATIENT_CLINIC_OR_DEPARTMENT_OTHER): Payer: Managed Care, Other (non HMO) | Attending: Plastic Surgery

## 2014-06-02 ENCOUNTER — Encounter (HOSPITAL_BASED_OUTPATIENT_CLINIC_OR_DEPARTMENT_OTHER): Payer: Self-pay | Admitting: Plastic Surgery

## 2014-06-02 DIAGNOSIS — I872 Venous insufficiency (chronic) (peripheral): Secondary | ICD-10-CM | POA: Insufficient documentation

## 2014-06-02 DIAGNOSIS — L97929 Non-pressure chronic ulcer of unspecified part of left lower leg with unspecified severity: Secondary | ICD-10-CM | POA: Insufficient documentation

## 2014-06-02 DIAGNOSIS — L97129 Non-pressure chronic ulcer of left thigh with unspecified severity: Secondary | ICD-10-CM | POA: Insufficient documentation

## 2014-06-03 ENCOUNTER — Other Ambulatory Visit: Payer: Self-pay | Admitting: Family Medicine

## 2014-06-04 ENCOUNTER — Encounter (HOSPITAL_BASED_OUTPATIENT_CLINIC_OR_DEPARTMENT_OTHER): Payer: Self-pay | Admitting: Plastic Surgery

## 2014-06-06 ENCOUNTER — Encounter: Payer: Self-pay | Admitting: Family Medicine

## 2014-06-06 ENCOUNTER — Other Ambulatory Visit: Payer: Self-pay | Admitting: Family Medicine

## 2014-06-06 MED ORDER — PENTOXIFYLLINE ER 400 MG PO TBCR: 400.0000 mg | EXTENDED_RELEASE_TABLET | Freq: Three times a day (TID) | ORAL | Status: DC

## 2014-06-06 NOTE — Telephone Encounter (Signed)
Last office visit 03/24/2014.  Last refilled 05/08/2014 for #50.  Ok to refill?

## 2014-06-07 ENCOUNTER — Other Ambulatory Visit: Payer: Self-pay | Admitting: Family Medicine

## 2014-06-08 NOTE — Telephone Encounter (Signed)
Last office visit 03/24/2014. Last refilled 05/08/2014 for #50 with no refills.  Ok to refill?

## 2014-06-09 ENCOUNTER — Other Ambulatory Visit: Payer: Self-pay | Admitting: Family Medicine

## 2014-06-09 DIAGNOSIS — I872 Venous insufficiency (chronic) (peripheral): Secondary | ICD-10-CM | POA: Diagnosis not present

## 2014-06-09 DIAGNOSIS — L97929 Non-pressure chronic ulcer of unspecified part of left lower leg with unspecified severity: Secondary | ICD-10-CM | POA: Diagnosis not present

## 2014-06-09 DIAGNOSIS — L97129 Non-pressure chronic ulcer of left thigh with unspecified severity: Secondary | ICD-10-CM | POA: Diagnosis not present

## 2014-06-09 MED ORDER — OXYCODONE-ACETAMINOPHEN 10-325 MG PO TABS: 1.0000 | ORAL_TABLET | ORAL | Status: DC | PRN

## 2014-06-09 NOTE — Telephone Encounter (Signed)
[name] notified prescription is ready to be picked up at the front desk.

## 2014-06-10 NOTE — Progress Notes (Signed)
[facility]  NAME:  [name] [name]                   ACCOUNT NO.:  MEDICAL RECORD NO.:  [id number]      DATE OF BIRTH:  07/04/1960  PHYSICIAN:  Claire Sanger, DO       VISIT DATE:  06/09/2014                                  OFFICE VISIT   SUBJECTIVE:  The patient is a 53-year-old male, who is here for a followup on his left lower extremity and severe chronic venous insufficiency ulcer.  He underwent debridement with Integra placement and VAC in the OR.  He is incorporating the Integra very well and it looks healthy and no periwound sign of infection.  There has been no change in his medications.  SOCIAL HISTORY:  He is trying to stop smoking.  OBJECTIVE:  On exam, he is alert, oriented, cooperative, and not in any distress.  He is very pleasant.  His breathing is unlabored.  His heart rate is regular.  He looks better now than he has in a long time.  The wound is clean. The Integra was then placed.  We will continue with a VAC and plan on OR for likely skin graft placement and see him back in 1 week.     Claire Sanger, DO     CS/MEDQ  D:  06/09/2014  T:  06/09/2014  Job:  [id number] 

## 2014-06-12 ENCOUNTER — Encounter (HOSPITAL_BASED_OUTPATIENT_CLINIC_OR_DEPARTMENT_OTHER): Payer: Self-pay | Admitting: *Deleted

## 2014-06-12 NOTE — Progress Notes (Signed)
Pt here monthlt-labs 05/29/14-ekg-11/15

## 2014-06-18 ENCOUNTER — Encounter (HOSPITAL_BASED_OUTPATIENT_CLINIC_OR_DEPARTMENT_OTHER)
Admission: RE | Disposition: A | Discharge status: Home / Self Care | Payer: Self-pay | Source: Ambulatory Visit | Attending: Plastic Surgery

## 2014-06-18 ENCOUNTER — Ambulatory Visit (HOSPITAL_BASED_OUTPATIENT_CLINIC_OR_DEPARTMENT_OTHER): Payer: Managed Care, Other (non HMO) | Admitting: Anesthesiology

## 2014-06-18 ENCOUNTER — Ambulatory Visit (HOSPITAL_BASED_OUTPATIENT_CLINIC_OR_DEPARTMENT_OTHER)
Admission: RE | Admit: 2014-06-18 | Discharge: 2014-06-18 | Disposition: A | Discharge status: Home / Self Care | Payer: Managed Care, Other (non HMO) | Source: Ambulatory Visit | Attending: Plastic Surgery | Admitting: Plastic Surgery

## 2014-06-18 ENCOUNTER — Other Ambulatory Visit: Payer: Self-pay | Admitting: Plastic Surgery

## 2014-06-18 ENCOUNTER — Encounter (HOSPITAL_BASED_OUTPATIENT_CLINIC_OR_DEPARTMENT_OTHER): Payer: Self-pay | Admitting: Plastic Surgery

## 2014-06-18 DIAGNOSIS — E119 Type 2 diabetes mellitus without complications: Secondary | ICD-10-CM | POA: Insufficient documentation

## 2014-06-18 DIAGNOSIS — Z880 Allergy status to penicillin: Secondary | ICD-10-CM | POA: Diagnosis not present

## 2014-06-18 DIAGNOSIS — F17210 Nicotine dependence, cigarettes, uncomplicated: Secondary | ICD-10-CM | POA: Insufficient documentation

## 2014-06-18 DIAGNOSIS — Z883 Allergy status to other anti-infective agents status: Secondary | ICD-10-CM | POA: Insufficient documentation

## 2014-06-18 DIAGNOSIS — I10 Essential (primary) hypertension: Secondary | ICD-10-CM | POA: Insufficient documentation

## 2014-06-18 DIAGNOSIS — F419 Anxiety disorder, unspecified: Secondary | ICD-10-CM | POA: Diagnosis not present

## 2014-06-18 DIAGNOSIS — F329 Major depressive disorder, single episode, unspecified: Secondary | ICD-10-CM | POA: Diagnosis not present

## 2014-06-18 DIAGNOSIS — E785 Hyperlipidemia, unspecified: Secondary | ICD-10-CM | POA: Insufficient documentation

## 2014-06-18 DIAGNOSIS — Z91040 Latex allergy status: Secondary | ICD-10-CM | POA: Insufficient documentation

## 2014-06-18 DIAGNOSIS — G2581 Restless legs syndrome: Secondary | ICD-10-CM | POA: Diagnosis not present

## 2014-06-18 DIAGNOSIS — S81802D Unspecified open wound, left lower leg, subsequent encounter: Secondary | ICD-10-CM

## 2014-06-18 DIAGNOSIS — Z7982 Long term (current) use of aspirin: Secondary | ICD-10-CM | POA: Insufficient documentation

## 2014-06-18 DIAGNOSIS — K219 Gastro-esophageal reflux disease without esophagitis: Secondary | ICD-10-CM | POA: Diagnosis not present

## 2014-06-18 DIAGNOSIS — G4730 Sleep apnea, unspecified: Secondary | ICD-10-CM | POA: Diagnosis not present

## 2014-06-18 DIAGNOSIS — L97929 Non-pressure chronic ulcer of unspecified part of left lower leg with unspecified severity: Secondary | ICD-10-CM | POA: Diagnosis present

## 2014-06-18 DIAGNOSIS — Z79899 Other long term (current) drug therapy: Secondary | ICD-10-CM | POA: Diagnosis not present

## 2014-06-18 HISTORY — PX: SKIN SPLIT GRAFT: SHX444

## 2014-06-18 LAB — POCT I-STAT, CHEM 8
BUN: 17 mg/dL (ref 6–23)
Calcium, Ion: 1.22 mmol/L (ref 1.12–1.23)
Chloride: 102 mEq/L (ref 96–112)
Creatinine, Ser: 1 mg/dL (ref 0.50–1.35)
Glucose, Bld: 158 mg/dL — ABNORMAL HIGH (ref 70–99)
HCT: 40 % (ref 39.0–52.0)
Hemoglobin: 13.6 g/dL (ref 13.0–17.0)
Potassium: 3.6 mmol/L (ref 3.5–5.1)
Sodium: 140 mmol/L (ref 135–145)
TCO2: 23 mmol/L (ref 0–100)

## 2014-06-18 LAB — GLUCOSE, CAPILLARY: Glucose-Capillary: 158 mg/dL — ABNORMAL HIGH (ref 70–99)

## 2014-06-18 SURGERY — APPLICATION, GRAFT, SKIN, SPLIT-THICKNESS
Anesthesia: General | Site: Leg Lower | Laterality: Left

## 2014-06-18 MED ORDER — OXYCODONE HCL 5 MG/5ML PO SOLN: 5.0000 mg | Freq: Once | ORAL | Status: AC | PRN

## 2014-06-18 MED ORDER — FENTANYL CITRATE 0.05 MG/ML IJ SOLN
INTRAMUSCULAR | Status: AC
  Filled 2014-06-18: qty 6

## 2014-06-18 MED ORDER — OXYCODONE HCL 5 MG PO TABS
ORAL_TABLET | ORAL | Status: AC
  Filled 2014-06-18: qty 1

## 2014-06-18 MED ORDER — OXYCODONE HCL 5 MG PO TABS
5.0000 mg | ORAL_TABLET | Freq: Once | ORAL | Status: AC | PRN
  Administered 2014-06-18: 5 mg via ORAL

## 2014-06-18 MED ORDER — LACTATED RINGERS IV SOLN
INTRAVENOUS | Status: DC
  Administered 2014-06-18 (×2): via INTRAVENOUS

## 2014-06-18 MED ORDER — BUPIVACAINE-EPINEPHRINE 0.25% -1:200000 IJ SOLN
INTRAMUSCULAR | Status: DC | PRN
  Administered 2014-06-18: 20 mL

## 2014-06-18 MED ORDER — CIPROFLOXACIN IN D5W 400 MG/200ML IV SOLN
INTRAVENOUS | Status: AC
  Filled 2014-06-18: qty 200

## 2014-06-18 MED ORDER — MIDAZOLAM HCL 5 MG/5ML IJ SOLN
INTRAMUSCULAR | Status: DC | PRN
  Administered 2014-06-18: 2 mg via INTRAVENOUS

## 2014-06-18 MED ORDER — FENTANYL CITRATE 0.05 MG/ML IJ SOLN
INTRAMUSCULAR | Status: AC
  Filled 2014-06-18: qty 2

## 2014-06-18 MED ORDER — FENTANYL CITRATE 0.05 MG/ML IJ SOLN
25.0000 ug | INTRAMUSCULAR | Status: DC | PRN
  Administered 2014-06-18: 50 ug via INTRAVENOUS
  Administered 2014-06-18 (×2): 25 ug via INTRAVENOUS
  Administered 2014-06-18: 50 ug via INTRAVENOUS

## 2014-06-18 MED ORDER — FENTANYL CITRATE 0.05 MG/ML IJ SOLN: 50.0000 ug | INTRAMUSCULAR | Status: DC | PRN

## 2014-06-18 MED ORDER — ONDANSETRON HCL 4 MG/2ML IJ SOLN: 4.0000 mg | Freq: Once | INTRAMUSCULAR | Status: DC | PRN

## 2014-06-18 MED ORDER — BUPIVACAINE-EPINEPHRINE (PF) 0.25% -1:200000 IJ SOLN
INTRAMUSCULAR | Status: AC
  Filled 2014-06-18: qty 30

## 2014-06-18 MED ORDER — FENTANYL CITRATE 0.05 MG/ML IJ SOLN
INTRAMUSCULAR | Status: DC | PRN
  Administered 2014-06-18 (×2): 50 ug via INTRAVENOUS
  Administered 2014-06-18: 100 ug via INTRAVENOUS

## 2014-06-18 MED ORDER — LIDOCAINE HCL (CARDIAC) 20 MG/ML IV SOLN
INTRAVENOUS | Status: DC | PRN
  Administered 2014-06-18: 50 mg via INTRAVENOUS

## 2014-06-18 MED ORDER — CIPROFLOXACIN IN D5W 400 MG/200ML IV SOLN
400.0000 mg | INTRAVENOUS | Status: AC
  Administered 2014-06-18: 400 mg via INTRAVENOUS

## 2014-06-18 MED ORDER — LIDOCAINE-EPINEPHRINE 1 %-1:100000 IJ SOLN
INTRAMUSCULAR | Status: AC
  Filled 2014-06-18: qty 1

## 2014-06-18 MED ORDER — SODIUM CHLORIDE 0.9 % IR SOLN
Status: DC | PRN
  Administered 2014-06-18: 500 mL

## 2014-06-18 MED ORDER — MIDAZOLAM HCL 2 MG/2ML IJ SOLN: 1.0000 mg | INTRAMUSCULAR | Status: DC | PRN

## 2014-06-18 MED ORDER — BACITRACIN ZINC 500 UNIT/GM EX OINT
TOPICAL_OINTMENT | CUTANEOUS | Status: AC
  Filled 2014-06-18: qty 28.35

## 2014-06-18 MED ORDER — MIDAZOLAM HCL 2 MG/2ML IJ SOLN
INTRAMUSCULAR | Status: AC
  Filled 2014-06-18: qty 2

## 2014-06-18 MED ORDER — PROPOFOL 10 MG/ML IV BOLUS
INTRAVENOUS | Status: DC | PRN
  Administered 2014-06-18: 300 mg via INTRAVENOUS

## 2014-06-18 MED ORDER — ONDANSETRON HCL 4 MG/2ML IJ SOLN
INTRAMUSCULAR | Status: DC | PRN
Start: 2014-06-18 — End: 2014-06-18
  Administered 2014-06-18: 4 mg via INTRAVENOUS

## 2014-06-18 SURGICAL SUPPLY — 72 items
APL SKNCLS STERI-STRIP NONHPOA (GAUZE/BANDAGES/DRESSINGS)
BAG DECANTER FOR FLEXI CONT (MISCELLANEOUS) ×2 IMPLANT
BANDAGE ELASTIC 3 VELCRO ST LF (GAUZE/BANDAGES/DRESSINGS) IMPLANT
BANDAGE ELASTIC 4 VELCRO ST LF (GAUZE/BANDAGES/DRESSINGS) IMPLANT
BANDAGE ELASTIC 6 VELCRO ST LF (GAUZE/BANDAGES/DRESSINGS) ×2 IMPLANT
BENZOIN TINCTURE PRP APPL 2/3 (GAUZE/BANDAGES/DRESSINGS) IMPLANT
BLADE CLIPPER SURG (BLADE) IMPLANT
BLADE DERMATOME SS (BLADE) ×2 IMPLANT
BLADE HEX COATED 2.75 (ELECTRODE) IMPLANT
BLADE SURG 10 STRL SS (BLADE) ×2 IMPLANT
BLADE SURG 15 STRL LF DISP TIS (BLADE) ×1 IMPLANT
BLADE SURG 15 STRL SS (BLADE) ×1
BNDG COHESIVE 4X5 TAN STRL (GAUZE/BANDAGES/DRESSINGS) IMPLANT
BNDG GAUZE ELAST 4 BULKY (GAUZE/BANDAGES/DRESSINGS) ×4 IMPLANT
COTTONBALL LRG STERILE PKG (GAUZE/BANDAGES/DRESSINGS) IMPLANT
COVER BACK TABLE 60X90IN (DRAPES) ×2 IMPLANT
COVER MAYO STAND STRL (DRAPES) ×2 IMPLANT
DECANTER SPIKE VIAL GLASS SM (MISCELLANEOUS) IMPLANT
DERMACARRIERS GRAFT 1 TO 1.5 (DISPOSABLE) ×2
DRAPE EXTREMITY T 121X128X90 (DRAPE) IMPLANT
DRAPE INCISE IOBAN 66X45 STRL (DRAPES) ×2 IMPLANT
DRAPE LAPAROTOMY 100X72 PEDS (DRAPES) IMPLANT
DRAPE SURG 17X23 STRL (DRAPES) IMPLANT
DRAPE U-SHAPE 76X120 STRL (DRAPES) ×2 IMPLANT
DRSG ADAPTIC 3X8 NADH LF (GAUZE/BANDAGES/DRESSINGS) ×4 IMPLANT
DRSG EMULSION OIL 3X3 NADH (GAUZE/BANDAGES/DRESSINGS) ×2 IMPLANT
DRSG OPSITE 6X11 MED (GAUZE/BANDAGES/DRESSINGS) IMPLANT
DRSG PAD ABDOMINAL 8X10 ST (GAUZE/BANDAGES/DRESSINGS) ×4 IMPLANT
DRSG TEGADERM 4X10 (GAUZE/BANDAGES/DRESSINGS) IMPLANT
ELECT REM PT RETURN 9FT ADLT (ELECTROSURGICAL)
ELECTRODE REM PT RTRN 9FT ADLT (ELECTROSURGICAL) IMPLANT
GAUZE SPONGE 4X4 12PLY STRL (GAUZE/BANDAGES/DRESSINGS) ×4 IMPLANT
GAUZE VASELINE 3X9 (GAUZE/BANDAGES/DRESSINGS) ×4 IMPLANT
GLOVE BIO SURGEON STRL SZ 6.5 (GLOVE) IMPLANT
GLOVE SURG SS PI 6.5 STRL IVOR (GLOVE) ×8 IMPLANT
GOWN STRL REUS W/ TWL LRG LVL3 (GOWN DISPOSABLE) ×4 IMPLANT
GOWN STRL REUS W/TWL LRG LVL3 (GOWN DISPOSABLE) ×4
GRAFT DERMACARRIERS 1 TO 1.5 (DISPOSABLE) ×1 IMPLANT
LIQUID BAND (GAUZE/BANDAGES/DRESSINGS) IMPLANT
NEEDLE PRECISIONGLIDE 27X1.5 (NEEDLE) ×2 IMPLANT
NS IRRIG 1000ML POUR BTL (IV SOLUTION) ×2 IMPLANT
PACK BASIN DAY SURGERY FS (CUSTOM PROCEDURE TRAY) ×2 IMPLANT
PAD CAST 3X4 CTTN HI CHSV (CAST SUPPLIES) IMPLANT
PAD CAST 4YDX4 CTTN HI CHSV (CAST SUPPLIES) ×1 IMPLANT
PADDING CAST ABS 4INX4YD NS (CAST SUPPLIES)
PADDING CAST ABS COTTON 4X4 ST (CAST SUPPLIES) IMPLANT
PADDING CAST COTTON 3X4 STRL (CAST SUPPLIES)
PADDING CAST COTTON 4X4 STRL (CAST SUPPLIES) ×1
PENCIL BUTTON HOLSTER BLD 10FT (ELECTRODE) IMPLANT
SHEET MEDIUM DRAPE 40X70 STRL (DRAPES) ×2 IMPLANT
SPLINT FIBERGLASS 4X30 (CAST SUPPLIES) ×2 IMPLANT
SPONGE LAP 18X18 X RAY DECT (DISPOSABLE) ×2 IMPLANT
STAPLER VISISTAT 35W (STAPLE) ×2 IMPLANT
STOCKINETTE 4X48 STRL (DRAPES) IMPLANT
STOCKINETTE 6  STRL (DRAPES)
STOCKINETTE 6 STRL (DRAPES) IMPLANT
STOCKINETTE IMPERVIOUS LG (DRAPES) IMPLANT
STRIP CLOSURE SKIN 1/2X4 (GAUZE/BANDAGES/DRESSINGS) IMPLANT
SURGILUBE 2OZ TUBE FLIPTOP (MISCELLANEOUS) ×2 IMPLANT
SUT MON AB 5-0 PS2 18 (SUTURE) IMPLANT
SUT SILK 3 0 SH CR/8 (SUTURE) IMPLANT
SUT SILK 4 0 SH CR/8 (SUTURE) IMPLANT
SUT VIC AB 3-0 FS2 27 (SUTURE) IMPLANT
SUT VIC AB 5-0 P-3 18X BRD (SUTURE) ×1 IMPLANT
SUT VIC AB 5-0 P3 18 (SUTURE) ×1
SUT VIC AB 5-0 PS2 18 (SUTURE) ×10 IMPLANT
SUT VICRYL 4-0 PS2 18IN ABS (SUTURE) IMPLANT
SYR BULB IRRIGATION 50ML (SYRINGE) ×2 IMPLANT
SYR CONTROL 10ML LL (SYRINGE) ×2 IMPLANT
TOWEL OR 17X24 6PK STRL BLUE (TOWEL DISPOSABLE) ×4 IMPLANT
TRAY DSU PREP LF (CUSTOM PROCEDURE TRAY) ×2 IMPLANT
UNDERPAD 30X30 INCONTINENT (UNDERPADS AND DIAPERS) IMPLANT

## 2014-06-18 NOTE — Anesthesia Postprocedure Evaluation (Signed)
  Anesthesia Post-op Note  Patient: [name] [name]  Procedure(s) Performed: Procedure(s): SKIN GRAFT SPLIT THICKNESS TO LOWER LEFT LEG WOUND WITH PLACEMENT OF VAC (Left)  Patient Location: PACU  Anesthesia Type:General  Level of Consciousness: awake, alert  and oriented  Airway and Oxygen Therapy: Patient Spontanous Breathing  Post-op Pain: none  Post-op Assessment: Post-op Vital signs reviewed, Patient's Cardiovascular Status Stable, Respiratory Function Stable, Patent Airway and Pain level controlled  Post-op Vital Signs: stable  Last Vitals:  Filed Vitals:   06/18/14 1242  BP: 123/52  Pulse: 84  Temp: 36.7 C  Resp: 24    Complications: No apparent anesthesia complications

## 2014-06-18 NOTE — H&P (View-Only) (Signed)
[facility]  NAME:  Joen LauraSAWYER, Heriberto                   ACCOUNT NO.:  MEDICAL RECORD NO.:  [id number]      DATE OF BIRTH:  1960/07/13  PHYSICIAN:  Wayland Denislaire Sanger, DO       VISIT DATE:  06/09/2014                                  OFFICE VISIT   SUBJECTIVE:  The patient is a 54 year old male, who is here for a followup on his left lower extremity and severe chronic venous insufficiency ulcer.  He underwent debridement with Integra placement and VAC in the OR.  He is incorporating the Integra very well and it looks healthy and no periwound sign of infection.  There has been no change in his medications.  SOCIAL HISTORY:  He is trying to stop smoking.  OBJECTIVE:  On exam, he is alert, oriented, cooperative, and not in any distress.  He is very pleasant.  His breathing is unlabored.  His heart rate is regular.  He looks better now than he has in a long time.  The wound is clean. The Integra was then placed.  We will continue with a VAC and plan on OR for likely skin graft placement and see him back in 1 week.     Wayland Denislaire Sanger, DO     CS/MEDQ  D:  06/09/2014  T:  06/09/2014  Job:  [id number]

## 2014-06-18 NOTE — Interval H&P Note (Signed)
History and Physical Interval Note:  06/18/2014 7:49 AM  [name] [name]  has presented today for surgery, with the diagnosis of ulcer of the left lower leg  The various methods of treatment have been discussed with the patient and family. After consideration of risks, benefits and other options for treatment, the patient has consented to  Procedure(s): SKIN GRAFT SPLIT THICKNESS TO LOWER LEFT LEG WOUND WITH PLACEMENT OF VAC (Left) as a surgical intervention .  The patient's history has been reviewed, patient examined, no change in status, stable for surgery.  I have reviewed the patient's chart and labs.  Questions were answered to the patient's satisfaction.     SANGER,Ameah Chanda

## 2014-06-18 NOTE — Brief Op Note (Signed)
06/18/2014  10:41 AM  PATIENT:  [name] [name]  54 y.o. male  PRE-OPERATIVE DIAGNOSIS:  ulcer of the left lower leg  POST-OPERATIVE DIAGNOSIS:  ulcer of the left lower leg  PROCEDURE:  Procedure(s): SKIN GRAFT SPLIT THICKNESS TO LOWER LEFT LEG WOUND WITH PLACEMENT OF VAC (Left)  SURGEON:  Surgeon(s) and Role:    * Paiton Boultinghouse Sanger, DO - Primary  PHYSICIAN ASSISTANT: Shawn Rayburn, PA  ASSISTANTS: none   ANESTHESIA:   general  EBL:  Total I/O In: 1200 [I.V.:1200] Out: -   BLOOD ADMINISTERED:none  DRAINS: none   LOCAL MEDICATIONS USED:  MARCAINE     SPECIMEN:  No Specimen  DISPOSITION OF SPECIMEN:  N/A  COUNTS:  YES  TOURNIQUET:  * No tourniquets in log *  DICTATION: .Dragon Dictation  PLAN OF CARE: Discharge to home after PACU  PATIENT DISPOSITION:  PACU - hemodynamically stable.   Delay start of Pharmacological VTE agent (>24hrs) due to surgical blood loss or risk of bleeding: no

## 2014-06-18 NOTE — Anesthesia Preprocedure Evaluation (Signed)
Anesthesia Evaluation  Patient identified by MRN, date of birth, ID band Patient awake    Reviewed: Allergy & Precautions, NPO status , Patient's Chart, lab work & pertinent test results  Airway Mallampati: II  TM Distance: >3 FB Neck ROM: Full    Dental  (+) Teeth Intact, Dental Advisory Given   Pulmonary Current Smoker,  breath sounds clear to auscultation        Cardiovascular hypertension, Rhythm:Regular Rate:Normal     Neuro/Psych    GI/Hepatic   Endo/Other  diabetes  Renal/GU      Musculoskeletal   Abdominal (+) + obese,   Peds  Hematology   Anesthesia Other Findings   Reproductive/Obstetrics                             Anesthesia Physical Anesthesia Plan  ASA: III  Anesthesia Plan: General   Post-op Pain Management:    Induction: Intravenous  Airway Management Planned: LMA  Additional Equipment:   Intra-op Plan:   Post-operative Plan:   Informed Consent: I have reviewed the patients History and Physical, chart, labs and discussed the procedure including the risks, benefits and alternatives for the proposed anesthesia with the patient or authorized representative who has indicated his/her understanding and acceptance.   Dental advisory given  Plan Discussed with: CRNA and Anesthesiologist  Anesthesia Plan Comments: (Stasis Ulcer L. Leg Obesity Smoker Type 2 DM  Hypertension  Plan GA with LMA  Kipp Broodavid Marylu Dudenhoeffer)        Anesthesia Quick Evaluation

## 2014-06-18 NOTE — Discharge Instructions (Signed)
Do not remove split or VAC  Call your surgeon if you experience:   1.  Fever over 101.0. 2.  Inability to urinate. 3.  Nausea and/or vomiting. 4.  Extreme swelling or bruising at the surgical site. 5.  Continued bleeding from the incision. 6.  Increased pain, redness or drainage from the incision. 7.  Problems related to your pain medication. 8. Any change in color, movement and/or sensation 9. Any problems and/or concerns   Post Anesthesia Home Care Instructions  Activity: Get plenty of rest for the remainder of the day. A responsible adult should stay with you for 24 hours following the procedure.  For the next 24 hours, DO NOT: -Drive a car -Advertising copywriterperate machinery -Drink alcoholic beverages -Take any medication unless instructed by your physician -Make any legal decisions or sign important papers.  Meals: Start with liquid foods such as gelatin or soup. Progress to regular foods as tolerated. Avoid greasy, spicy, heavy foods. If nausea and/or vomiting occur, drink only clear liquids until the nausea and/or vomiting subsides. Call your physician if vomiting continues.  Special Instructions/Symptoms: Your throat may feel dry or sore from the anesthesia or the breathing tube placed in your throat during surgery. If this causes discomfort, gargle with warm salt water. The discomfort should disappear within 24 hours.

## 2014-06-18 NOTE — Op Note (Signed)
Operative Note   DATE OF OPERATION: 06/18/2014  [facility]  SURGICAL DIVISION: Plastic Surgery  PREOPERATIVE DIAGNOSES:  Left leg chronic ulcer to muscle (15 x 15 cm)  POSTOPERATIVE DIAGNOSES:  same  PROCEDURE:  Split thickness skin graft to left leg (15 x 15 cm) and the VAC placement  SURGEON: Wayland Denislaire Sanger, DO  ASSISTANT: Shawn Rayburn, PA  ANESTHESIA:  General.   COMPLICATIONS: None.   INDICATIONS FOR PROCEDURE:  The patient, [name] [name] is a 54 y.o. male born on November 29, 1960, is here for treatment of a chronic left leg ulcer that he has been dealing with for over one year. MRN: [id number]  CONSENT:  Informed consent was obtained directly from the patient. Risks, benefits and alternatives were fully discussed. Specific risks including but not limited to bleeding, infection, hematoma, seroma, scarring, pain, infection, contracture, asymmetry, wound healing problems, and need for further surgery were all discussed. The patient did have an ample opportunity to have questions answered to satisfaction.   DESCRIPTION OF PROCEDURE:  The patient was taken to the operating room. SCDs was placed on the right leg and IV antibiotics were given. The patient's operative site was prepped and draped in a sterile fashion. A time out was performed and all information was confirmed to be correct.  General anesthesia was administered.  The donor site was injected with marcaien for intraoperative hemostasis and postoperative pain management.  The area was irrigated with antibiotic solution. The silicone sheet was removed from the integra and there was very good incorporation. The dermatome was set at 04/999 inch and used to obtain a skin graft with the 3 incha and 2 inch guard.  The graft was meshed 1:1 and secured to the leg 15 x 15 cm with 5-0 Vicryl. The adaptic was applied over the graft followed by the VAC.  There was an excellent seal. Vaseline gauze and an ABD  was applied to the donor site.  The leg was wrapped with kerlex and a splint.  The patient tolerated the procedure well.  There were no complications. The patient was allowed to wake from anesthesia, extubated and taken to the recovery room in satisfactory condition.

## 2014-06-18 NOTE — Anesthesia Procedure Notes (Signed)
Procedure Name: LMA Insertion Date/Time: 06/18/2014 9:42 AM Performed by: Zenia ResidesPAYNE, Lesslie Mckeehan D Pre-anesthesia Checklist: Patient identified, Emergency Drugs available, Suction available and Patient being monitored Patient Re-evaluated:Patient Re-evaluated prior to inductionOxygen Delivery Method: Circle System Utilized Preoxygenation: Pre-oxygenation with 100% oxygen Intubation Type: IV induction Ventilation: Mask ventilation without difficulty LMA: LMA inserted LMA Size: 5.0 Number of attempts: 1 Airway Equipment and Method: Bite block Placement Confirmation: positive ETCO2 Tube secured with: Tape Dental Injury: Teeth and Oropharynx as per pre-operative assessment

## 2014-06-18 NOTE — H&P (Signed)
[name] [name] is an 54 y.o. male.   Chief Complaint: left leg ulcer HPI: The patient is a 54 yrs old wm here for treatment of a left leg ulcer.  He underwent several debridements in the past with Integra and VAC placement.   He has shown signs of improement with granulation tissue formation.    Past Medical History  Diagnosis Date  . Anxiety   . Diabetes mellitus type II   . HTN (hypertension)   . Morbid obesity   . HLD (hyperlipidemia)   . Tobacco abuse   . Restless legs syndrome (RLS) 11/20/2012  . Wears glasses   . Snores   . Depression   . GERD (gastroesophageal reflux disease)   . Sleep apnea     no tested    Past Surgical History  Procedure Laterality Date  . Kidney stone removal  2004  . Arm surgery Right 1969    Abscess excision and debridement at elbow  . Vasectomy  1994  . Incision and drainage of wound Left 04/23/2014    Procedure: IRRIGATION AND DEBRIDEMENT LOWER LEFT LEG WOUND WITH PLACEMENT OF INTEGRA AND VAC;  Surgeon: Wayland Denis, DO;  Location: [facility];  Service: Plastics;  Laterality: Left;  Marland Kitchen Minor application of wound vac Left 04/23/2014    Procedure: MINOR APPLICATION OF WOUND VAC;  Surgeon: Wayland Denis, DO;  Location: [facility];  Service: Plastics;  Laterality: Left;  . I&d extremity Left 05/29/2014    Procedure: IRRIGATION AND DEBRIDEMENT OF LEFT LEG WOUND AND PLACEMENT OF  INTEGRA  AND  VAC;  Surgeon: Wayland Denis, DO;  Location: [facility];  Service: Plastics;  Laterality: Left;    Family History  Problem Relation Age of Onset  . Stroke Mother     multiple  . Leukemia Mother   . Pneumonia Mother   . Cancer Mother   . Diabetes Mother   . Diabetes Father   . Heart disease Father   . Hyperlipidemia Father   . Hypertension Father    Social History:  reports that he has been smoking Cigarettes.  He has a 18.75 pack-year smoking history. He has never used smokeless tobacco. He reports that he  drinks alcohol. He reports that he does not use illicit drugs.  Allergies:  Allergies  Allergen Reactions  . Betadine [Povidone Iodine]     Raised rash 04/23/14  . Latex Dermatitis  . Penicillins     REACTION: unspecified    Medications Prior to Admission  Medication Sig Dispense Refill  . BAYER MICROLET LANCETS lancets Use to test twice daily or  as directed.  Dx: 250.70, 443.81 200 each 3  . glipiZIDE (GLUCOTROL XL) 10 MG 24 hr tablet TAKE 1 BY MOUTH DAILY 90 tablet 0  . glucose blood (BAYER CONTOUR NEXT TEST) test strip Test two times daily or as  instructed 200 each 3  . lisinopril-hydrochlorothiazide (PRINZIDE,ZESTORETIC) 20-12.5 MG per tablet TAKE 2 BY MOUTH DAILY 180 tablet 0  . metFORMIN (GLUCOPHAGE-XR) 500 MG 24 hr tablet TAKE 4 BY MOUTH DAILY WITH BREAKFAST 360 tablet 0  . Multiple Vitamin (MULTIVITAMIN) tablet Take 1 tablet by mouth daily.      Marland Kitchen oxyCODONE-acetaminophen (PERCOCET) 10-325 MG per tablet Take 1 tablet by mouth every 4 (four) hours as needed. 50 tablet 0  . pantoprazole (PROTONIX) 40 MG tablet TAKE 1 TABLET (40 MG TOTAL) BY MOUTH DAILY. 50 tablet 3  . PARoxetine (PAXIL) 20 MG tablet TAKE 1  BY MOUTH EVERY MORNING 90 tablet 0  . pentoxifylline (TRENTAL) 400 MG CR tablet Take 1 tablet (400 mg total) by mouth 3 (three) times daily with meals. 90 tablet 3  . simvastatin (ZOCOR) 40 MG tablet Take 1 tablet by mouth at  bedtime 90 tablet 3  . aspirin 81 MG tablet Take 81 mg by mouth daily.      Marland Kitchen. ECHINACEA PO Take by mouth daily.    . fish oil-omega-3 fatty acids 1000 MG capsule Take 1 g by mouth daily.      . Fluocinonide 0.1 % CREA   1  . glucosamine-chondroitin 500-400 MG tablet Take 1 tablet by mouth daily.      . saxagliptin HCl (ONGLYZA) 2.5 MG TABS tablet Take 1 tablet (2.5 mg total) by mouth daily. 30 tablet 0  . vitamin C (ASCORBIC ACID) 500 MG tablet Take 500 mg by mouth daily.      No results found for this or any previous visit (from the past 48  hour(s)). No results found.  Review of Systems  Constitutional: Negative.   HENT: Negative.   Eyes: Negative.   Respiratory: Negative.   Cardiovascular: Negative.   Gastrointestinal: Negative.   Genitourinary: Negative.   Musculoskeletal: Negative.   Skin: Negative.   Neurological: Negative.   Psychiatric/Behavioral: Negative.     Blood pressure 138/58, pulse 106, temperature 98.2 F (36.8 C), temperature source Oral, resp. rate 20, height 6' (1.829 m), weight 158.759 kg (350 lb), SpO2 97 %. Physical Exam  Constitutional: He is oriented to person, place, and time. He appears well-developed and well-nourished.  HENT:  Head: Normocephalic and atraumatic.  Eyes: Conjunctivae are normal. Pupils are equal, round, and reactive to light.  Cardiovascular: Normal rate.   Respiratory: Effort normal.  GI: Soft.  Neurological: He is alert and oriented to person, place, and time.  Skin: Skin is warm.  Psychiatric: He has a normal mood and affect. His behavior is normal. Judgment and thought content normal.     Assessment/Plan Plan for split thickness skin graft and VAC placement.  SANGER,Andreanna Mikolajczak 06/18/2014, 9:31 AM

## 2014-06-18 NOTE — Transfer of Care (Signed)
Immediate Anesthesia Transfer of Care Note  Patient: [name] [name]  Procedure(s) Performed: Procedure(s): SKIN GRAFT SPLIT THICKNESS TO LOWER LEFT LEG WOUND WITH PLACEMENT OF VAC (Left)  Patient Location: PACU  Anesthesia Type:General  Level of Consciousness: awake, alert  and oriented  Airway & Oxygen Therapy: Patient Spontanous Breathing and Patient connected to face mask oxygen  Post-op Assessment: Report given to PACU RN and Post -op Vital signs reviewed and stable  Post vital signs: Reviewed and stable  Complications: No apparent anesthesia complications

## 2014-06-19 ENCOUNTER — Encounter (HOSPITAL_BASED_OUTPATIENT_CLINIC_OR_DEPARTMENT_OTHER): Payer: Self-pay | Admitting: Plastic Surgery

## 2014-06-30 ENCOUNTER — Encounter (HOSPITAL_BASED_OUTPATIENT_CLINIC_OR_DEPARTMENT_OTHER): Payer: Managed Care, Other (non HMO) | Attending: Plastic Surgery

## 2014-06-30 DIAGNOSIS — L97929 Non-pressure chronic ulcer of unspecified part of left lower leg with unspecified severity: Secondary | ICD-10-CM | POA: Diagnosis not present

## 2014-06-30 DIAGNOSIS — I872 Venous insufficiency (chronic) (peripheral): Secondary | ICD-10-CM | POA: Diagnosis not present

## 2014-07-01 ENCOUNTER — Other Ambulatory Visit: Payer: Self-pay | Admitting: Family Medicine

## 2014-07-07 ENCOUNTER — Encounter: Payer: Self-pay | Admitting: Family Medicine

## 2014-07-07 DIAGNOSIS — I872 Venous insufficiency (chronic) (peripheral): Secondary | ICD-10-CM | POA: Diagnosis not present

## 2014-07-07 MED ORDER — GLIPIZIDE ER 10 MG PO TB24: ORAL_TABLET | ORAL | Status: DC

## 2014-07-07 MED ORDER — PAROXETINE HCL 20 MG PO TABS: ORAL_TABLET | ORAL | Status: DC

## 2014-07-07 MED ORDER — PANTOPRAZOLE SODIUM 40 MG PO TBEC: DELAYED_RELEASE_TABLET | ORAL | Status: DC

## 2014-07-07 MED ORDER — SIMVASTATIN 40 MG PO TABS: ORAL_TABLET | ORAL | Status: DC

## 2014-07-07 MED ORDER — LISINOPRIL-HYDROCHLOROTHIAZIDE 20-12.5 MG PO TABS: ORAL_TABLET | ORAL | Status: DC

## 2014-07-07 MED ORDER — PENTOXIFYLLINE ER 400 MG PO TBCR: 400.0000 mg | EXTENDED_RELEASE_TABLET | Freq: Three times a day (TID) | ORAL | Status: DC

## 2014-07-07 MED ORDER — METFORMIN HCL ER 500 MG PO TB24: ORAL_TABLET | ORAL | Status: DC

## 2014-07-07 MED ORDER — SAXAGLIPTIN HCL 2.5 MG PO TABS: ORAL_TABLET | ORAL | Status: DC

## 2014-07-07 NOTE — Progress Notes (Signed)
[facility]  NAME:  Joen LauraSAWYER, Zae              ACCOUNT NO.:  [id number]  MEDICAL RECORD NO.:  [id number]      DATE OF BIRTH:  1961/04/06  PHYSICIAN:  Wayland Denislaire Sanger, DO       VISIT DATE:  07/07/2014                                  OFFICE VISIT   The patient is a 54 year old male who is here for followup after skin graft placement to his left leg.  He has __________.  We will do collagen with a little alginate and see him back in a week.     Wayland Denislaire Sanger, DO     CS/MEDQ  D:  07/07/2014  T:  07/07/2014  Job:  [id number]

## 2014-07-07 NOTE — Telephone Encounter (Signed)
Ok to refill all medication for 90 day supply with 3 refills?

## 2014-07-07 NOTE — Telephone Encounter (Signed)
Yes, 90 day supply, 3 ref

## 2014-07-08 ENCOUNTER — Telehealth: Payer: Self-pay | Admitting: *Deleted

## 2014-07-08 NOTE — Telephone Encounter (Signed)
[facility] for Lisinopril-HCTZ 20-12.5 mg.  PA form completed [facility] [phone].

## 2014-07-09 NOTE — Telephone Encounter (Signed)
PA approved effective 07/08/2014 through 07/09/2015.

## 2014-07-21 DIAGNOSIS — I872 Venous insufficiency (chronic) (peripheral): Secondary | ICD-10-CM | POA: Diagnosis not present

## 2014-07-21 NOTE — Progress Notes (Signed)
[facility]  NAME:  Joen LauraSAWYER, Kyzer              ACCOUNT NO.:  [id number]  MEDICAL RECORD NO.:  [id number]      DATE OF BIRTH:  1960/10/24  PHYSICIAN:  Wayland Denislaire Sanger, DO       VISIT DATE:  07/21/2014                                  OFFICE VISIT   The patient is a 54 year old male who is here for followup after undergoing a split-thickness skin graft to his left leg and multiple debridements.  He has partial take probably 65-70% with some epithelial buds.  It does not appear to be infected.  The swelling has improved. He is still smoking and he still has his legs down.  He is wanting to stay off work.  On exam, he is alert, oriented, cooperative, not in any distress.  I was very frank with him that if he continues to smoke and put his leg down, there is not much more that can be done and that he has to be compliant in order for healing to take place.  An Endoform was placed as the Profore, and we will see him back in 1 week.     Wayland Denislaire Sanger, DO     CS/MEDQ  D:  07/21/2014  T:  07/21/2014  Job:  [id number]

## 2014-07-28 DIAGNOSIS — I872 Venous insufficiency (chronic) (peripheral): Secondary | ICD-10-CM | POA: Diagnosis not present

## 2014-07-31 ENCOUNTER — Other Ambulatory Visit: Payer: Self-pay | Admitting: Family Medicine

## 2014-07-31 MED ORDER — OXYCODONE-ACETAMINOPHEN 10-325 MG PO TABS
1.0000 | ORAL_TABLET | ORAL | Status: DC | PRN
Start: 2014-07-31 — End: 2014-09-23

## 2014-07-31 NOTE — Telephone Encounter (Signed)
[name] notified prescription is ready to be picked up at the front desk.

## 2014-07-31 NOTE — Telephone Encounter (Signed)
Percocet refill request.  Patient last seen 03/24/2014.  Last filled 06/09/2014.  Please advise.

## 2014-08-11 ENCOUNTER — Encounter (HOSPITAL_BASED_OUTPATIENT_CLINIC_OR_DEPARTMENT_OTHER): Payer: Managed Care, Other (non HMO) | Attending: Plastic Surgery

## 2014-08-11 DIAGNOSIS — L97821 Non-pressure chronic ulcer of other part of left lower leg limited to breakdown of skin: Secondary | ICD-10-CM | POA: Insufficient documentation

## 2014-08-11 DIAGNOSIS — I872 Venous insufficiency (chronic) (peripheral): Secondary | ICD-10-CM | POA: Insufficient documentation

## 2014-08-25 DIAGNOSIS — I872 Venous insufficiency (chronic) (peripheral): Secondary | ICD-10-CM | POA: Diagnosis not present

## 2014-09-01 ENCOUNTER — Encounter (HOSPITAL_BASED_OUTPATIENT_CLINIC_OR_DEPARTMENT_OTHER): Payer: PRIVATE HEALTH INSURANCE | Attending: Plastic Surgery

## 2014-09-01 ENCOUNTER — Encounter: Payer: Self-pay | Admitting: Family Medicine

## 2014-09-01 ENCOUNTER — Telehealth: Payer: Self-pay | Admitting: *Deleted

## 2014-09-01 DIAGNOSIS — L97921 Non-pressure chronic ulcer of unspecified part of left lower leg limited to breakdown of skin: Secondary | ICD-10-CM | POA: Insufficient documentation

## 2014-09-01 DIAGNOSIS — I872 Venous insufficiency (chronic) (peripheral): Secondary | ICD-10-CM | POA: Diagnosis present

## 2014-09-01 MED ORDER — MELOXICAM 15 MG PO TABS: 15.0000 mg | ORAL_TABLET | Freq: Every day | ORAL | Status: DC

## 2014-09-01 NOTE — Telephone Encounter (Signed)
PLEASE NOTE: All timestamps contained within this report are represented as [place] Standard Time. CONFIDENTIALTY NOTICE: This fax transmission is intended only for the addressee. It contains information that is legally privileged, confidential or otherwise protected from use or disclosure. If you are not the intended recipient, you are strictly prohibited from reviewing, disclosing, copying using or disseminating any of this information or taking any action in reliance on or regarding this information. If you have received this fax in error, please notify us immediately by telephone so that we can arrange for its return to us. Phone: [phone], Toll-Free: [phone], Fax: [phone] Page: 1 of 1 Call Id: [id number] [facility] - Client TELEPHONE ADVICE RECORD [facility] Patient Name: [name] [name] Gender: Male DOB: [birth year] Age: 54 Y 5 M 7 D Return Phone Number: [phone] (Primary), [phone] (Secondary) Address: [address] City/State/Zip: [place] [address] Client [facility] Client Client Site [facility] - Night Physician Copland, Spencer Contact Type Call Call Type Triage / Clinical Caller Name Cala BradfordKimberly Relationship To Patient Spouse Return Phone Number [phone] (Primary) Chief Complaint Sore Throat Initial Comment chart 2/3 Caller states, exposed to tonsillitis and pharyngitis , sore throat, fever Nurse Assessment Nurse: Annye Englisharmon, RN, Angelique Blonderenise Date/Time (Eastern Time): 08/30/2014 7:33:51 AM Confirm and document reason for call. If symptomatic, describe symptoms. ---chart 2/3 Caller states, exposed to tonsillitis and pharyngitis , sore throat, fever. Wants to know if there is a walk in clinic open on Sat am [facility]. Has the patient traveled out of the country within the last 30 days? ---Not Applicable Does the patient require triage? ---Declined  Triage Please document clinical information provided and list any resource used. ---Advised per profile: [facility] be [facility] during Saturday hours 9:00am - 1:00pm [address], [place], [place] [address] Will need to call [place] office at [phone], Saturday morning at 9:00am for appointment. Verb understanding. Guidelines Guideline Title Affirmed Question Affirmed Notes Nurse Date/Time (Eastern Time) Disp. Time Lamount Cohen(Eastern Time) Disposition Final User 08/30/2014 7:22:02 AM Send To RN Personal Sherilyn CooterHenry, RN, Thurmond ButtsWade 08/30/2014 7:37:21 AM Clinical Call Yes Annye Englisharmon, RN, Angelique Blonderenise After Care Instructions Given Call Event Type User Date / Time Description

## 2014-09-01 NOTE — Telephone Encounter (Signed)
Last office visit 03/24/2014. Not on current medication list.  Ok to refill?

## 2014-09-02 ENCOUNTER — Telehealth: Payer: Self-pay | Admitting: *Deleted

## 2014-09-02 NOTE — Telephone Encounter (Signed)
[facility] this week because he was referred by his wound care doctor. Upon his evaluation his BP at rest was 148/98 and after starting activities his BP increased to 166/103, patient was asymptomatic and did not have any signs of distress. Patient stated that he was very nervous about home health physical therapy and feels that is why his BP went up . Denny Peonrin would like to know if there are any perimeters and activities regarding them working with the patient and his BP being elevated?  Please advise.

## 2014-09-02 NOTE — Telephone Encounter (Signed)
He is morbidly obese and his BP will increase easily with activity.  Just get [name]'s wife Selena BattenKim to check his BP about once a week when he is resting and calm. She is an Charity fundraiserN. Call me if consistently > 140/90

## 2014-09-03 NOTE — Telephone Encounter (Signed)
[facility] notified as instructed by telephone.

## 2014-09-08 DIAGNOSIS — I872 Venous insufficiency (chronic) (peripheral): Secondary | ICD-10-CM | POA: Diagnosis not present

## 2014-09-22 ENCOUNTER — Other Ambulatory Visit: Payer: Self-pay | Admitting: Family Medicine

## 2014-09-23 NOTE — Telephone Encounter (Signed)
Last office visit 03/24/2014.  Last refilled 07/31/2014 for #50 with no refills.  Ok to refill?

## 2014-09-24 MED ORDER — OXYCODONE-ACETAMINOPHEN 10-325 MG PO TABS: 1.0000 | ORAL_TABLET | ORAL | Status: DC | PRN

## 2014-09-24 NOTE — Telephone Encounter (Signed)
Mr. Zenda AlpersSawyer notified prescription is ready to be picked up at the front desk.

## 2014-12-02 ENCOUNTER — Other Ambulatory Visit: Payer: Self-pay | Admitting: Family Medicine

## 2014-12-02 ENCOUNTER — Encounter: Payer: Self-pay | Admitting: Family Medicine

## 2014-12-02 MED ORDER — BAYER MICROLET LANCETS MISC: Status: DC

## 2014-12-02 MED ORDER — GLUCOSE BLOOD VI STRP: ORAL_STRIP | Status: DC

## 2014-12-03 ENCOUNTER — Other Ambulatory Visit: Payer: Self-pay | Admitting: Family Medicine

## 2014-12-03 MED ORDER — OXYCODONE-ACETAMINOPHEN 10-325 MG PO TABS: 1.0000 | ORAL_TABLET | ORAL | Status: DC | PRN

## 2014-12-03 NOTE — Telephone Encounter (Signed)
Left message for [name] that his prescription is ready to be picked up at the front desk.

## 2014-12-21 ENCOUNTER — Other Ambulatory Visit: Payer: Self-pay | Admitting: Family Medicine

## 2014-12-21 MED ORDER — MELOXICAM 15 MG PO TABS: 15.0000 mg | ORAL_TABLET | Freq: Every day | ORAL | Status: DC

## 2014-12-24 ENCOUNTER — Ambulatory Visit (INDEPENDENT_AMBULATORY_CARE_PROVIDER_SITE_OTHER): Payer: PRIVATE HEALTH INSURANCE | Admitting: Family Medicine

## 2014-12-24 ENCOUNTER — Encounter: Payer: Self-pay | Admitting: Family Medicine

## 2014-12-24 VITALS — BP 118/76 | HR 104 | Temp 97.7°F | Ht 72.0 in | Wt 355.0 lb

## 2014-12-24 DIAGNOSIS — F17200 Nicotine dependence, unspecified, uncomplicated: Secondary | ICD-10-CM

## 2014-12-24 DIAGNOSIS — N400 Enlarged prostate without lower urinary tract symptoms: Secondary | ICD-10-CM | POA: Diagnosis not present

## 2014-12-24 DIAGNOSIS — L97909 Non-pressure chronic ulcer of unspecified part of unspecified lower leg with unspecified severity: Secondary | ICD-10-CM

## 2014-12-24 DIAGNOSIS — K589 Irritable bowel syndrome without diarrhea: Secondary | ICD-10-CM | POA: Insufficient documentation

## 2014-12-24 DIAGNOSIS — L97529 Non-pressure chronic ulcer of other part of left foot with unspecified severity: Secondary | ICD-10-CM | POA: Diagnosis not present

## 2014-12-24 DIAGNOSIS — Z79899 Other long term (current) drug therapy: Secondary | ICD-10-CM

## 2014-12-24 DIAGNOSIS — E11622 Type 2 diabetes mellitus with other skin ulcer: Secondary | ICD-10-CM

## 2014-12-24 DIAGNOSIS — L97929 Non-pressure chronic ulcer of unspecified part of left lower leg with unspecified severity: Principal | ICD-10-CM

## 2014-12-24 DIAGNOSIS — Z720 Tobacco use: Secondary | ICD-10-CM

## 2014-12-24 DIAGNOSIS — E11621 Type 2 diabetes mellitus with foot ulcer: Secondary | ICD-10-CM

## 2014-12-24 DIAGNOSIS — Z6841 Body Mass Index (BMI) 40.0 and over, adult: Secondary | ICD-10-CM

## 2014-12-24 DIAGNOSIS — I10 Essential (primary) hypertension: Secondary | ICD-10-CM

## 2014-12-24 MED ORDER — ROPINIROLE HCL 0.5 MG PO TABS: 0.5000 mg | ORAL_TABLET | Freq: Every day | ORAL | Status: DC

## 2014-12-24 NOTE — Progress Notes (Signed)
Pre visit review using our clinic review tool, if applicable. No additional management support is needed unless otherwise documented below in the visit note. 

## 2014-12-24 NOTE — Progress Notes (Signed)
Dr. Karleen Hampshire T. Aneisa Karren, MD, CAQ Sports [facility] [address] [place], [address] Phone: [phone] Fax: [phone]  12/24/2014  Patient: [name] [name], MRN: [id number], DOB: 1960/06/01, 54 y.o.  Primary Physician:  Hannah Beat, MD  Chief Complaint: Leg Pain  Subjective:   [name] [name] is a 54 y.o. very pleasant male patient who presents with the following:  Wt Readings from Last 3 Encounters:  12/24/14 355 lb (161.027 kg)  06/18/14 350 lb (158.759 kg)  05/29/14 350 lb (158.759 kg)    Loss of L foot sensation  RLS much at night. He is having a lot of pain at nighttime, primarily due to restless legs.  He occasionally even has to take some opioids for this.  Lidocaine had no effect - he would like it noted in his chart that he would prefer a different local anesthetic rather than lidocaine.  I noted this in his allergies, but it is not a true allergy.  Needs f/u on DM Diabetes Mellitus: Tolerating Medications: yes Compliance with diet: fair Exercise: minimal / intermittent Avg blood sugars at home: not checking Foot problems: none Hypoglycemia: none No nausea, vomitting, blurred vision, polyuria.  Lab Results  Component Value Date   HGBA1C 7.2* 12/24/2014   HGBA1C 8.7* 02/19/2014   HGBA1C 7.6* 11/15/2012   Lab Results  Component Value Date   MICROALBUR 2.82* 07/27/2007   LDLCALC 80 11/15/2012   CREATININE 1.29* 12/24/2014    Wt Readings from Last 3 Encounters:  12/24/14 355 lb (161.027 kg)  06/18/14 350 lb (158.759 kg)  05/29/14 350 lb (158.759 kg)    Body mass index is 48.14 kg/(m^2).   Down from 420 lbs.  Blood pressure is much improved.  Walking is not like it was. Needs handicap sticker.   Bladder hurts to hold it for any length of time, and he is going to the bathroom about every 90 minutes.  He also is having a bowel movement at least 4 times daily, and stress makes this worse.  He has been having a  problem with this at work, where the bathroom is a long distance from his workstation per his report.  Past Medical History, Surgical History, Social History, Family History, Problem List, Medications, and Allergies have been reviewed and updated if relevant.  Patient Active Problem List   Diagnosis Date Noted  . Diabetic leg ulcer 07/18/2013    Priority: High  . 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 Morbid obesity with BMI of 50.0-59.9, adult 09/29/2006    Priority: High  . HYPERLIPIDEMIA 01/16/2009    Priority: Medium  . TOBACCO ABUSE 07/27/2007    Priority: Medium  . Essential hypertension 09/29/2006    Priority: Medium  . 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 Restless legs syndrome (RLS) 11/20/2012  . Generalized anxiety disorder   . CALCULUS, KIDNEY 05/30/2002    Past Medical History  Diagnosis Date  . Anxiety   . Diabetes mellitus type II   . HTN (hypertension)   . Morbid obesity   . HLD (hyperlipidemia)   . Tobacco abuse   . Restless legs syndrome (RLS) 11/20/2012  . Wears glasses   . Snores   . Depression   . GERD (gastroesophageal reflux disease)   . Sleep apnea     no tested    Past Surgical History  Procedure Laterality Date  .  Kidney stone removal  2004  . Arm surgery Right 1969    Abscess excision and debridement at elbow  . Vasectomy  1994  . Incision and drainage of wound Left 04/23/2014    Procedure: IRRIGATION AND DEBRIDEMENT LOWER LEFT LEG WOUND WITH PLACEMENT OF INTEGRA AND VAC;  Surgeon: Wayland Denis, DO;  Location: [facility];  Service: Plastics;  Laterality: Left;  Marland Kitchen Minor application of wound vac Left 04/23/2014    Procedure: MINOR APPLICATION OF WOUND VAC;  Surgeon: Wayland Denis, DO;  Location: [facility];  Service: Plastics;  Laterality: Left;  . I&d extremity Left 05/29/2014    Procedure: IRRIGATION AND DEBRIDEMENT OF LEFT LEG WOUND AND  PLACEMENT OF  INTEGRA  AND  VAC;  Surgeon: Wayland Denis, DO;  Location: [facility];  Service: Plastics;  Laterality: Left;  . Skin split graft Left 06/18/2014    Procedure: SKIN GRAFT SPLIT THICKNESS TO LOWER LEFT LEG WOUND WITH PLACEMENT OF VAC;  Surgeon: Wayland Denis, DO;  Location: [facility];  Service: Plastics;  Laterality: Left;    History   Social History  . Marital Status: Married    Spouse Name: N/A  . Number of Children: 3  . Years of Education: N/A   Occupational History  . [employer] and [employer]ivables    Social History Main Topics  . Smoking status: Current Every Day Smoker -- 0.25 packs/day for 75 years    Types: Cigarettes  . Smokeless tobacco: Never Used     Comment: one cigarette daily  . Alcohol Use: 0.0 oz/week    0 Standard drinks or equivalent per week     Comment: rarely  . Drug Use: No  . Sexual Activity:    Partners: Female   Other Topics Concern  . Not on file   Social History Narrative    Family History  Problem Relation Age of Onset  . Stroke Mother     multiple  . Leukemia Mother   . Pneumonia Mother   . Cancer Mother   . Diabetes Mother   . Diabetes Father   . Heart disease Father   . Hyperlipidemia Father   . Hypertension Father     Allergies  Allergen Reactions  . Betadine [Povidone Iodine]     Raised rash 04/23/14  . Latex Dermatitis  . Penicillins     REACTION: unspecified    Medication list reviewed and updated in full in [facility].   GEN: No acute illnesses, no fevers, chills. GI: No n/v/d, eating normally Pulm: No SOB Interactive and getting along well at home.  Otherwise, ROS is as per the HPI.  Objective:   BP 118/76 mmHg  Pulse 104  Temp(Src) 97.7 F (36.5 C) (Oral)  Ht 6' (1.829 m)  Wt 355 lb (161.027 kg)  BMI 48.14 kg/m2  GEN: WDWN, NAD, Non-toxic, A & O x 3 HEENT: Atraumatic, Normocephalic. Neck supple. No masses, No LAD. Ears and Nose: No external  deformity. CV: RRR, No M/G/R. No JVD. No thrill. No extra heart sounds. PULM: CTA B, no wheezes, crackles, rhonchi. No retractions. No resp. distress. No accessory muscle use. EXTR: No c/c/e. At this point healed very large diabetic ulcer on the left lower extremity.  Decreased sensation in the foot on the left NEURO Normal gait.  PSYCH: Normally interactive. Conversant. Not depressed or anxious appearing.  Calm demeanor.   Laboratory and Imaging Data: Results for orders placed or performed in visit  on 12/24/14  Basic metabolic panel  Result Value Ref Range   Glucose 161 (H) 65 - 99 mg/dL   BUN 15 6 - 24 mg/dL   Creatinine, Ser 8.11 (H) 0.76 - 1.27 mg/dL   GFR calc non Af Amer 63 >59 mL/min/1.73   GFR calc Af Amer 73 >59 mL/min/1.73   BUN/Creatinine Ratio 12 9 - 20   Sodium 141 134 - 144 mmol/L   Potassium 4.5 3.5 - 5.2 mmol/L   Chloride 98 97 - 108 mmol/L   CO2 23 18 - 29 mmol/L   Calcium 10.4 (H) 8.7 - 10.2 mg/dL  Hepatic function panel  Result Value Ref Range   Total Protein 6.8 6.0 - 8.5 g/dL   Albumin 4.5 3.5 - 5.5 g/dL   Bilirubin Total 0.3 0.0 - 1.2 mg/dL   Bilirubin, Direct 9.14 0.00 - 0.40 mg/dL   Alkaline Phosphatase 52 39 - 117 IU/L   AST 15 0 - 40 IU/L   ALT 23 0 - 44 IU/L  CBC with Differential/Platelet  Result Value Ref Range   WBC 10.5 3.4 - 10.8 x10E3/uL   RBC 5.66 4.14 - 5.80 x10E6/uL   Hemoglobin 16.2 12.6 - 17.7 g/dL   Hematocrit 78.2 95.6 - 51.0 %   MCV 85 79 - 97 fL   MCH 28.6 26.6 - 33.0 pg   MCHC 33.8 31.5 - 35.7 g/dL   RDW 21.3 08.6 - 57.8 %   Platelets 238 150 - 379 x10E3/uL   Neutrophils 69 %   Lymphs 20 %   Monocytes 9 %   Eos 2 %   Basos 0 %   Neutrophils Absolute 7.1 (H) 1.4 - 7.0 x10E3/uL   Lymphocytes Absolute 2.1 0.7 - 3.1 x10E3/uL   Monocytes Absolute 0.9 0.1 - 0.9 x10E3/uL   EOS (ABSOLUTE) 0.2 0.0 - 0.4 x10E3/uL   Basophils Absolute 0.0 0.0 - 0.2 x10E3/uL   Immature Granulocytes 0 %   Immature Grans (Abs) 0.0 0.0 - 0.1 x10E3/uL    Hemoglobin A1c  Result Value Ref Range   Hgb A1c MFr Bld 7.2 (H) 4.8 - 5.6 %   Est. average glucose Bld gHb Est-mCnc 160 mg/dL     Assessment and Plan:   Type 2 diabetes mellitus with diabetic ulcer of left lower leg - Plan: Basic metabolic panel, Hemoglobin A1c  BPH (benign prostatic hyperplasia)  IBS (irritable bowel syndrome)  Morbid obesity with BMI of 50.0-59.9, adult  Long-term use of high-risk medication - Plan: Hepatic function panel, CBC with Differential/Platelet  Diabetic leg ulcer  Essential hypertension  TOBACCO ABUSE  Doing well diabetes and taking great care with his left lower extremity.  70 pound weight loss.  Diabetes and hypertension is doing much better.  Very compliant now.  Unfortunately he did resume smoking.  He is only doing a few a day and I encouraged him to stop again.  Patient does still weight 350 pounds and he is had extensive problems with his left lower extremity, multiple surgeries, and he has significant decreased sensation in that foot distal to the wound.  I did write out his work and accommodation such that they would allow him to go to the bathroom every 90 minutes, which does not seem unreasonable.  He is not having any problems at all doing his normal job.  Follow-up: 6 mo cpx  New Prescriptions   ROPINIROLE (REQUIP) 0.5 MG TABLET    Take 1 tablet (0.5 mg total) by mouth at bedtime.  Orders Placed This Encounter  Procedures  . Basic metabolic panel  . Hepatic function panel  . CBC with Differential/Platelet  . Hemoglobin A1c    Signed,  Valerie Fredin T. Koraline Phillipson, MD   Patient's Medications  New Prescriptions   ROPINIROLE (REQUIP) 0.5 MG TABLET    Take 1 tablet (0.5 mg total) by mouth at bedtime.  Previous Medications   ASPIRIN 81 MG TABLET    Take 81 mg by mouth daily.     BAYER MICROLET LANCETS LANCETS    Use to test twice daily or as directed. Z61.096   ECHINACEA PO    Take by mouth daily.   FISH OIL-OMEGA-3 FATTY ACIDS  1000 MG CAPSULE    Take 1 g by mouth daily.     FLUOCINONIDE 0.1 % CREA       GLIPIZIDE (GLUCOTROL XL) 10 MG 24 HR TABLET    TAKE 1 BY MOUTH DAILY   GLUCOSAMINE-CHONDROITIN 500-400 MG TABLET    Take 1 tablet by mouth daily.     GLUCOSE BLOOD (BAYER CONTOUR NEXT TEST) TEST STRIP    Test two times daily or as instructed.  Dx: E11.621   LISINOPRIL-HYDROCHLOROTHIAZIDE (PRINZIDE,ZESTORETIC) 20-12.5 MG PER TABLET    TAKE 2 BY MOUTH DAILY   MELOXICAM (MOBIC) 15 MG TABLET    Take 1 tablet (15 mg total) by mouth daily.   METFORMIN (GLUCOPHAGE-XR) 500 MG 24 HR TABLET    TAKE 4 BY MOUTH DAILY WITH BREAKFAST   MULTIPLE VITAMIN (MULTIVITAMIN) TABLET    Take 1 tablet by mouth daily.     OXYCODONE-ACETAMINOPHEN (PERCOCET) 10-325 MG PER TABLET    Take 1 tablet by mouth every 4 (four) hours as needed.   PANTOPRAZOLE (PROTONIX) 40 MG TABLET    TAKE 1 TABLET (40 MG TOTAL) BY MOUTH DAILY.   PAROXETINE (PAXIL) 20 MG TABLET    TAKE 1 BY MOUTH EVERY MORNING   PENTOXIFYLLINE (TRENTAL) 400 MG CR TABLET    Take 1 tablet (400 mg total) by mouth 3 (three) times daily with meals.   SAXAGLIPTIN HCL (ONGLYZA) 2.5 MG TABS TABLET    TAKE 1 BY MOUTH DAILY   SIMVASTATIN (ZOCOR) 40 MG TABLET    Take 1 tablet by mouth at  bedtime   VITAMIN C (ASCORBIC ACID) 500 MG TABLET    Take 500 mg by mouth daily.  Modified Medications   No medications on file  Discontinued Medications   No medications on file

## 2014-12-25 LAB — CBC WITH DIFFERENTIAL/PLATELET
BASOS ABS: 0 10*3/uL (ref 0.0–0.2)
BASOS: 0 %
EOS (ABSOLUTE): 0.2 10*3/uL (ref 0.0–0.4)
EOS: 2 %
Hematocrit: 48 % (ref 37.5–51.0)
Hemoglobin: 16.2 g/dL (ref 12.6–17.7)
Immature Grans (Abs): 0 10*3/uL (ref 0.0–0.1)
Immature Granulocytes: 0 %
LYMPHS: 20 %
Lymphocytes Absolute: 2.1 10*3/uL (ref 0.7–3.1)
MCH: 28.6 pg (ref 26.6–33.0)
MCHC: 33.8 g/dL (ref 31.5–35.7)
MCV: 85 fL (ref 79–97)
Monocytes Absolute: 0.9 10*3/uL (ref 0.1–0.9)
Monocytes: 9 %
NEUTROS PCT: 69 %
Neutrophils Absolute: 7.1 10*3/uL — ABNORMAL HIGH (ref 1.4–7.0)
Platelets: 238 10*3/uL (ref 150–379)
RBC: 5.66 x10E6/uL (ref 4.14–5.80)
RDW: 15 % (ref 12.3–15.4)
WBC: 10.5 10*3/uL (ref 3.4–10.8)

## 2014-12-25 LAB — BASIC METABOLIC PANEL
BUN/Creatinine Ratio: 12 (ref 9–20)
BUN: 15 mg/dL (ref 6–24)
CALCIUM: 10.4 mg/dL — AB (ref 8.7–10.2)
CO2: 23 mmol/L (ref 18–29)
Chloride: 98 mmol/L (ref 97–108)
Creatinine, Ser: 1.29 mg/dL — ABNORMAL HIGH (ref 0.76–1.27)
GFR calc Af Amer: 73 mL/min/{1.73_m2} (ref >59)
GFR calc non Af Amer: 63 mL/min/{1.73_m2} (ref >59)
Glucose: 161 mg/dL — ABNORMAL HIGH (ref 65–99)
POTASSIUM: 4.5 mmol/L (ref 3.5–5.2)
Sodium: 141 mmol/L (ref 134–144)

## 2014-12-25 LAB — HEMOGLOBIN A1C
Est. average glucose Bld gHb Est-mCnc: 160 mg/dL
Hgb A1c MFr Bld: 7.2 % — ABNORMAL HIGH (ref 4.8–5.6)

## 2014-12-25 LAB — HEPATIC FUNCTION PANEL
ALT: 23 IU/L (ref 0–44)
AST: 15 IU/L (ref 0–40)
Albumin: 4.5 g/dL (ref 3.5–5.5)
Alkaline Phosphatase: 52 IU/L (ref 39–117)
Bilirubin Total: 0.3 mg/dL (ref 0.0–1.2)
Bilirubin, Direct: 0.12 mg/dL (ref 0.00–0.40)
Total Protein: 6.8 g/dL (ref 6.0–8.5)

## 2015-03-12 ENCOUNTER — Other Ambulatory Visit: Payer: Self-pay | Admitting: Family Medicine

## 2015-03-16 MED ORDER — OXYCODONE-ACETAMINOPHEN 10-325 MG PO TABS: 1.0000 | ORAL_TABLET | ORAL | Status: DC | PRN

## 2015-03-16 NOTE — Telephone Encounter (Signed)
Left message for [name] that his prescription is ready to be picked up at the front desk. 

## 2015-05-22 ENCOUNTER — Other Ambulatory Visit: Payer: Self-pay | Admitting: Family Medicine

## 2015-05-22 NOTE — Telephone Encounter (Signed)
Request rx oxycodone apap. Call when ready for pick up. Last printed # 50 on 03/16/15. Pt last seen for leg pain on 12/24/14.Please advise.

## 2015-05-26 ENCOUNTER — Encounter: Payer: Self-pay | Admitting: Family Medicine

## 2015-05-26 MED ORDER — ROPINIROLE HCL 0.5 MG PO TABS: 0.5000 mg | ORAL_TABLET | Freq: Every day | ORAL | Status: DC

## 2015-05-26 MED ORDER — GLIPIZIDE ER 10 MG PO TB24: ORAL_TABLET | ORAL | Status: DC

## 2015-05-26 MED ORDER — SIMVASTATIN 40 MG PO TABS: ORAL_TABLET | ORAL | Status: DC

## 2015-05-26 MED ORDER — MELOXICAM 15 MG PO TABS: 15.0000 mg | ORAL_TABLET | Freq: Every day | ORAL | Status: DC

## 2015-05-26 MED ORDER — PANTOPRAZOLE SODIUM 40 MG PO TBEC: DELAYED_RELEASE_TABLET | ORAL | Status: DC

## 2015-05-26 MED ORDER — LISINOPRIL-HYDROCHLOROTHIAZIDE 20-12.5 MG PO TABS: ORAL_TABLET | ORAL | Status: DC

## 2015-05-26 MED ORDER — PENTOXIFYLLINE ER 400 MG PO TBCR: 400.0000 mg | EXTENDED_RELEASE_TABLET | Freq: Three times a day (TID) | ORAL | Status: DC

## 2015-05-26 MED ORDER — METFORMIN HCL ER 500 MG PO TB24: ORAL_TABLET | ORAL | Status: DC

## 2015-05-26 MED ORDER — PAROXETINE HCL 20 MG PO TABS: ORAL_TABLET | ORAL | Status: DC

## 2015-05-26 MED ORDER — OXYCODONE-ACETAMINOPHEN 10-325 MG PO TABS: 1.0000 | ORAL_TABLET | ORAL | Status: DC | PRN

## 2015-05-26 MED ORDER — SAXAGLIPTIN HCL 2.5 MG PO TABS: ORAL_TABLET | ORAL | Status: DC

## 2015-05-26 NOTE — Telephone Encounter (Signed)
Left message for [name] [name] that his prescription is ready to be picked up at the front desk. 

## 2015-05-31 DIAGNOSIS — I999 Unspecified disorder of circulatory system: Secondary | ICD-10-CM | POA: Insufficient documentation

## 2015-06-05 ENCOUNTER — Encounter: Payer: Self-pay | Admitting: Family Medicine

## 2015-06-05 DIAGNOSIS — IMO0002 Reserved for concepts with insufficient information to code with codable children: Secondary | ICD-10-CM

## 2015-06-05 DIAGNOSIS — Z79899 Other long term (current) drug therapy: Secondary | ICD-10-CM

## 2015-06-05 DIAGNOSIS — Z125 Encounter for screening for malignant neoplasm of prostate: Secondary | ICD-10-CM

## 2015-06-05 DIAGNOSIS — E1151 Type 2 diabetes mellitus with diabetic peripheral angiopathy without gangrene: Secondary | ICD-10-CM

## 2015-06-05 DIAGNOSIS — E1165 Type 2 diabetes mellitus with hyperglycemia: Principal | ICD-10-CM

## 2015-06-05 DIAGNOSIS — E785 Hyperlipidemia, unspecified: Secondary | ICD-10-CM

## 2015-06-10 ENCOUNTER — Other Ambulatory Visit: Payer: Self-pay | Admitting: Family Medicine

## 2015-06-10 DIAGNOSIS — E785 Hyperlipidemia, unspecified: Secondary | ICD-10-CM

## 2015-06-10 DIAGNOSIS — L97929 Non-pressure chronic ulcer of unspecified part of left lower leg with unspecified severity: Principal | ICD-10-CM

## 2015-06-10 DIAGNOSIS — E11622 Type 2 diabetes mellitus with other skin ulcer: Secondary | ICD-10-CM

## 2015-06-10 DIAGNOSIS — Z125 Encounter for screening for malignant neoplasm of prostate: Secondary | ICD-10-CM

## 2015-06-20 LAB — BASIC METABOLIC PANEL
BUN/Creatinine Ratio: 18 (ref 9–20)
BUN: 20 mg/dL (ref 6–24)
CO2: 24 mmol/L (ref 18–29)
CREATININE: 1.12 mg/dL (ref 0.76–1.27)
Calcium: 9.7 mg/dL (ref 8.7–10.2)
Chloride: 100 mmol/L (ref 96–106)
GFR calc Af Amer: 86 mL/min/{1.73_m2} (ref >59)
GFR calc non Af Amer: 74 mL/min/{1.73_m2} (ref >59)
GLUCOSE: 212 mg/dL — AB (ref 65–99)
POTASSIUM: 4.5 mmol/L (ref 3.5–5.2)
SODIUM: 140 mmol/L (ref 134–144)

## 2015-06-20 LAB — HEPATIC FUNCTION PANEL
ALBUMIN: 4.3 g/dL (ref 3.5–5.5)
ALT: 32 IU/L (ref 0–44)
AST: 18 IU/L (ref 0–40)
Alkaline Phosphatase: 44 IU/L (ref 39–117)
BILIRUBIN TOTAL: 0.3 mg/dL (ref 0.0–1.2)
BILIRUBIN, DIRECT: 0.11 mg/dL (ref 0.00–0.40)
TOTAL PROTEIN: 6.4 g/dL (ref 6.0–8.5)

## 2015-06-20 LAB — HEMOGLOBIN A1C
Est. average glucose Bld gHb Est-mCnc: 212 mg/dL
Hgb A1c MFr Bld: 9 % — ABNORMAL HIGH (ref 4.8–5.6)

## 2015-06-20 LAB — PSA: PROSTATE SPECIFIC AG, SERUM: 0.3 ng/mL (ref 0.0–4.0)

## 2015-06-20 LAB — LIPID PANEL
CHOLESTEROL TOTAL: 137 mg/dL (ref 100–199)
Chol/HDL Ratio: 4.6 ratio units (ref 0.0–5.0)
HDL: 30 mg/dL — ABNORMAL LOW (ref >39)
LDL CALC: 50 mg/dL (ref 0–99)
TRIGLYCERIDES: 287 mg/dL — AB (ref 0–149)
VLDL Cholesterol Cal: 57 mg/dL — ABNORMAL HIGH (ref 5–40)

## 2015-06-24 ENCOUNTER — Ambulatory Visit (INDEPENDENT_AMBULATORY_CARE_PROVIDER_SITE_OTHER): Payer: PRIVATE HEALTH INSURANCE | Admitting: Family Medicine

## 2015-06-24 ENCOUNTER — Encounter: Payer: Self-pay | Admitting: Family Medicine

## 2015-06-24 VITALS — BP 130/82 | HR 86 | Temp 97.6°F | Ht 72.0 in | Wt 361.2 lb

## 2015-06-24 DIAGNOSIS — E785 Hyperlipidemia, unspecified: Secondary | ICD-10-CM

## 2015-06-24 DIAGNOSIS — E11622 Type 2 diabetes mellitus with other skin ulcer: Secondary | ICD-10-CM

## 2015-06-24 DIAGNOSIS — I10 Essential (primary) hypertension: Secondary | ICD-10-CM

## 2015-06-24 DIAGNOSIS — Z6841 Body Mass Index (BMI) 40.0 and over, adult: Secondary | ICD-10-CM

## 2015-06-24 DIAGNOSIS — L97929 Non-pressure chronic ulcer of unspecified part of left lower leg with unspecified severity: Secondary | ICD-10-CM

## 2015-06-24 MED ORDER — SITAGLIPTIN PHOSPHATE 100 MG PO TABS: 100.0000 mg | ORAL_TABLET | Freq: Every day | ORAL | Status: DC

## 2015-06-24 NOTE — Patient Instructions (Signed)
Increase the onglyza to 2 tablets a day.  When they run out, start the Januvia 100 mg, 1 tablet a day.

## 2015-06-24 NOTE — Progress Notes (Signed)
Dr. Karleen Hampshire T. Japhet Morgenthaler, MD, CAQ [facility] [address] [place], [address] Phone: [phone] Fax: [phone]  06/24/2015  Patient: [name] [name], MRN: [id number], DOB: November 29, 1960, 55 y.o.  Primary Physician:  Hannah Beat, MD   Chief Complaint  Patient presents with  . Diabetes   Subjective:   [name] [name] is a 55 y.o. very pleasant male patient who presents with the following:  DM:  Glipizide XL 10 mg daily Metformin 2000 mg daily Onglyza 2.5  Diabetes Mellitus: Tolerating Medications: yes Compliance with diet: poor Exercise: minimal / intermittent Avg blood sugars at home: not checking Foot problems: none Ulcer is healed and closed now Hypoglycemia: none No nausea, vomitting, blurred vision, polyuria.  Lab Results  Component Value Date   HGBA1C 9.0* 06/19/2015   HGBA1C 7.2* 12/24/2014   HGBA1C 8.7* 02/19/2014   Lab Results  Component Value Date   MICROALBUR 2.82* 07/27/2007   LDLCALC 50 06/19/2015   CREATININE 1.12 06/19/2015    Wt Readings from Last 3 Encounters:  06/24/15 361 lb 4 oz (163.862 kg)  12/24/14 355 lb (161.027 kg)  06/18/14 350 lb (158.759 kg)    Body mass index is 48.98 kg/(m^2).    HTN: Tolerating all medications without side effects Stable and at goal No CP, no sob. No HA.  BP Readings from Last 3 Encounters:  06/24/15 130/82  12/24/14 118/76  06/18/14 123/52    Basic Metabolic Panel:    Component Value Date/Time   NA 140 06/19/2015 0730   NA 140 06/18/2014 0923   K 4.5 06/19/2015 0730   CL 100 06/19/2015 0730   CO2 24 06/19/2015 0730   BUN 20 06/19/2015 0730   BUN 17 06/18/2014 0923   CREATININE 1.12 06/19/2015 0730   GLUCOSE 212* 06/19/2015 0730   GLUCOSE 158* 06/18/2014 0923   CALCIUM 9.7 06/19/2015 0730   Lipids: Doing well, stable. Tolerating meds fine with no SE. Panel reviewed with patient.  Lipids:    Component Value Date/Time   CHOL 137 06/19/2015  0730   TRIG 287* 06/19/2015 0730   HDL 30* 06/19/2015 0730   LDLDIRECT 76 02/19/2014 1635   CHOLHDL 4.6 06/19/2015 0730    Lab Results  Component Value Date   ALT 32 06/19/2015   AST 18 06/19/2015   ALKPHOS 44 06/19/2015   BILITOT 0.3 06/19/2015     Past Medical History, Surgical History, Social History, Family History, Problem List, Medications, and Allergies have been reviewed and updated if relevant.  Patient Active Problem List   Diagnosis Date Noted  . Diabetic leg ulcer (HCC) 07/18/2013    Priority: High  . 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 Morbid obesity with BMI of 50.0-59.9, adult (HCC) 09/29/2006    Priority: High  . Hyperlipidemia LDL goal <70 01/16/2009    Priority: Medium  . TOBACCO ABUSE 07/27/2007    Priority: Medium  . Essential hypertension 09/29/2006    Priority: Medium  . BPH (benign prostatic hyperplasia) 12/24/2014  . IBS (irritable bowel syndrome) 12/24/2014  . 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 Restless legs syndrome (RLS) 11/20/2012  . Generalized anxiety disorder   . CALCULUS, KIDNEY 05/30/2002    Past Medical History  Diagnosis Date  . Anxiety   . Diabetes mellitus type II   . HTN (hypertension)   . Morbid obesity (HCC)   .  HLD (hyperlipidemia)   . Tobacco abuse   . Restless legs syndrome (RLS) 11/20/2012  . Wears glasses   . Snores   . Depression   . GERD (gastroesophageal reflux disease)   . Sleep apnea     no tested    Past Surgical History  Procedure Laterality Date  . Kidney stone removal  2004  . Arm surgery Right 1969    Abscess excision and debridement at elbow  . Vasectomy  1994  . Incision and drainage of wound Left 04/23/2014    Procedure: IRRIGATION AND DEBRIDEMENT LOWER LEFT LEG WOUND WITH PLACEMENT OF INTEGRA AND VAC;  Surgeon: Wayland Denis, DO;  Location: [facility];  Service: Plastics;  Laterality: Left;  Marland Kitchen  Minor application of wound vac Left 04/23/2014    Procedure: MINOR APPLICATION OF WOUND VAC;  Surgeon: Wayland Denis, DO;  Location: [facility];  Service: Plastics;  Laterality: Left;  . I&d extremity Left 05/29/2014    Procedure: IRRIGATION AND DEBRIDEMENT OF LEFT LEG WOUND AND PLACEMENT OF  INTEGRA  AND  VAC;  Surgeon: Wayland Denis, DO;  Location: [facility];  Service: Plastics;  Laterality: Left;  . Skin split graft Left 06/18/2014    Procedure: SKIN GRAFT SPLIT THICKNESS TO LOWER LEFT LEG WOUND WITH PLACEMENT OF VAC;  Surgeon: Wayland Denis, DO;  Location: [facility];  Service: Plastics;  Laterality: Left;    Social History   Social History  . Marital Status: Married    Spouse Name: N/A  . Number of Children: 3  . Years of Education: N/A   Occupational History  . [employer] and [employer]ables    Social History Main Topics  . Smoking status: Current Every Day Smoker -- 0.25 packs/day for 75 years    Types: Cigarettes  . Smokeless tobacco: Never Used     Comment: one cigarette daily  . Alcohol Use: 0.0 oz/week    0 Standard drinks or equivalent per week     Comment: rarely  . Drug Use: No  . Sexual Activity:    Partners: Female   Other Topics Concern  . Not on file   Social History Narrative    Family History  Problem Relation Age of Onset  . Stroke Mother     multiple  . Leukemia Mother   . Pneumonia Mother   . Cancer Mother   . Diabetes Mother   . Diabetes Father   . Heart disease Father   . Hyperlipidemia Father   . Hypertension Father     Allergies  Allergen Reactions  . Betadine [Povidone Iodine]     Raised rash 04/23/14  . Latex Dermatitis  . Penicillins     REACTION: unspecified  . Lidocaine Other (See Comments)    Minimal effect with lidocaine, prefers different anesthetic    Medication list reviewed and updated in full in [facility].   GEN: No acute illnesses, no fevers, chills. GI:  No n/v/d, eating normally Pulm: No SOB Interactive and getting along well at home.  Otherwise, ROS is as per the HPI.  Objective:   BP 130/82 mmHg  Pulse 86  Temp(Src) 97.6 F (36.4 C) (Oral)  Ht 6' (1.829 m)  Wt 361 lb 4 oz (163.862 kg)  BMI 48.98 kg/m2  GEN: WDWN, NAD, Non-toxic, A & O x 3 HEENT: Atraumatic, Normocephalic. Neck supple. No masses, No LAD. Ears and Nose: No external deformity. CV: RRR, No M/G/R. No JVD. No  thrill. No extra heart sounds. PULM: CTA B, no wheezes, crackles, rhonchi. No retractions. No resp. distress. No accessory muscle use. EXTR: No c/c/e NEURO Normal gait.  PSYCH: Normally interactive. Conversant. Not depressed or anxious appearing.  Calm demeanor.   Laboratory and Imaging Data: Results for orders placed or performed in visit on 06/05/15  Basic metabolic panel  Result Value Ref Range   Glucose 212 (H) 65 - 99 mg/dL   BUN 20 6 - 24 mg/dL   Creatinine, Ser 1.30 0.76 - 1.27 mg/dL   GFR calc non Af Amer 74 >59 mL/min/1.73   GFR calc Af Amer 86 >59 mL/min/1.73   BUN/Creatinine Ratio 18 9 - 20   Sodium 140 134 - 144 mmol/L   Potassium 4.5 3.5 - 5.2 mmol/L   Chloride 100 96 - 106 mmol/L   CO2 24 18 - 29 mmol/L   Calcium 9.7 8.7 - 10.2 mg/dL  Lipid panel  Result Value Ref Range   Cholesterol, Total 137 100 - 199 mg/dL   Triglycerides 865 (H) 0 - 149 mg/dL   HDL 30 (L) >78 mg/dL   VLDL Cholesterol Cal 57 (H) 5 - 40 mg/dL   LDL Calculated 50 0 - 99 mg/dL   Chol/HDL Ratio 4.6 0.0 - 5.0 ratio units  Hemoglobin A1c  Result Value Ref Range   Hgb A1c MFr Bld 9.0 (H) 4.8 - 5.6 %   Est. 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 Eating poorly again. Work on diet with wife.  D/c onglyza and change to max [place].  All other dm meds the same.  bp doing well  Lipids +/- likely from diet  Follow-up: Return in about 6 months (around 12/22/2015).  New Prescriptions   SITAGLIPTIN (JANUVIA) 100 MG TABLET    Take 1 tablet (100 mg total) by mouth daily.   Patient Instructions  Increase the onglyza to 2 tablets a day.  When they run out, start the Januvia 100 mg, 1 tablet a day.     Signed,  Elpidio Galea. Gonzalo Waymire, MD   Patient's Medications  New Prescriptions   SITAGLIPTIN (JANUVIA) 100 MG TABLET    Take 1 tablet (100 mg total) by mouth daily.  Previous Medications   ASPIRIN 81 MG TABLET    Take 81 mg by mouth daily.     BAYER MICROLET LANCETS LANCETS    Use to test twice daily or as directed. G29.528   ECHINACEA PO    Take by mouth daily.   FISH OIL-OMEGA-3 FATTY ACIDS 1000 MG CAPSULE    Take 1 g by mouth daily.     FLUOCINONIDE 0.1 % CREA       GLIPIZIDE (GLUCOTROL XL) 10 MG 24 HR TABLET  TAKE 1 BY MOUTH DAILY   GLUCOSAMINE-CHONDROITIN 500-400 MG TABLET    Take 1 tablet by mouth daily.     GLUCOSE BLOOD (BAYER CONTOUR NEXT TEST) TEST STRIP    Test two times daily or as instructed.  Dx: E11.621   LISINOPRIL-HYDROCHLOROTHIAZIDE (PRINZIDE,ZESTORETIC) 20-12.5 MG TABLET    TAKE 2 BY MOUTH DAILY   MELOXICAM (MOBIC) 15 MG TABLET    Take 1 tablet (15 mg total) by mouth daily.   METFORMIN (GLUCOPHAGE-XR) 500 MG 24 HR TABLET    TAKE 4 BY MOUTH DAILY WITH BREAKFAST   MULTIPLE VITAMIN (MULTIVITAMIN) TABLET    Take 1 tablet by mouth daily.     OXYCODONE-ACETAMINOPHEN (PERCOCET) 10-325 MG TABLET    Take 1 tablet by mouth every 4 (four) hours as needed.    PANTOPRAZOLE (PROTONIX) 40 MG TABLET    TAKE 1 TABLET (40 MG TOTAL) BY MOUTH DAILY.   PAROXETINE (PAXIL) 20 MG TABLET    TAKE 1 BY MOUTH EVERY MORNING   PENTOXIFYLLINE (TRENTAL) 400 MG CR TABLET    Take 1 tablet (400 mg total) by mouth 3 (three) times daily with meals.   ROPINIROLE (REQUIP) 0.5 MG TABLET    Take 1 tablet (0.5 mg total) by mouth at bedtime.   SIMVASTATIN (ZOCOR) 40 MG TABLET    Take 1 tablet by mouth at  bedtime   VITAMIN C (ASCORBIC ACID) 500 MG TABLET    Take 500 mg by mouth daily.  Modified Medications   No medications on file  Discontinued Medications   SAXAGLIPTIN HCL (ONGLYZA) 2.5 MG TABS TABLET    TAKE 1 BY MOUTH DAILY

## 2015-06-24 NOTE — Progress Notes (Signed)
Pre visit review using our clinic review tool, if applicable. No additional management support is needed unless otherwise documented below in the visit note. 

## 2015-08-30 ENCOUNTER — Other Ambulatory Visit: Payer: Self-pay | Admitting: Family Medicine

## 2015-08-31 MED ORDER — OXYCODONE-ACETAMINOPHEN 10-325 MG PO TABS: 1.0000 | ORAL_TABLET | ORAL | Status: DC | PRN

## 2015-08-31 MED ORDER — MELOXICAM 15 MG PO TABS: 15.0000 mg | ORAL_TABLET | Freq: Every day | ORAL | Status: DC

## 2015-08-31 NOTE — Telephone Encounter (Signed)
Left message for [name] [name] that his prescription is ready to be picked up at the front desk. 

## 2015-09-01 ENCOUNTER — Encounter: Payer: Self-pay | Admitting: Family Medicine

## 2015-09-23 ENCOUNTER — Encounter: Payer: Self-pay | Admitting: Family Medicine

## 2015-10-04 ENCOUNTER — Other Ambulatory Visit: Payer: Self-pay | Admitting: Family Medicine

## 2015-10-05 MED ORDER — PAROXETINE HCL 20 MG PO TABS: ORAL_TABLET | ORAL | Status: DC

## 2015-10-06 MED ORDER — METFORMIN HCL ER 500 MG PO TB24: ORAL_TABLET | ORAL | Status: DC

## 2015-10-06 MED ORDER — SITAGLIPTIN PHOSPHATE 100 MG PO TABS: 100.0000 mg | ORAL_TABLET | Freq: Every day | ORAL | Status: DC

## 2015-10-06 MED ORDER — SIMVASTATIN 40 MG PO TABS: ORAL_TABLET | ORAL | Status: DC

## 2015-10-06 MED ORDER — LISINOPRIL-HYDROCHLOROTHIAZIDE 20-12.5 MG PO TABS: ORAL_TABLET | ORAL | Status: DC

## 2015-10-06 MED ORDER — ROPINIROLE HCL 0.5 MG PO TABS: 0.5000 mg | ORAL_TABLET | Freq: Every day | ORAL | Status: DC

## 2015-10-06 MED ORDER — PAROXETINE HCL 20 MG PO TABS: ORAL_TABLET | ORAL | Status: DC

## 2015-10-06 MED ORDER — PANTOPRAZOLE SODIUM 40 MG PO TBEC: DELAYED_RELEASE_TABLET | ORAL | Status: DC

## 2015-10-06 MED ORDER — GLIPIZIDE ER 10 MG PO TB24: ORAL_TABLET | ORAL | Status: DC

## 2015-10-06 MED ORDER — PENTOXIFYLLINE ER 400 MG PO TBCR: 400.0000 mg | EXTENDED_RELEASE_TABLET | Freq: Three times a day (TID) | ORAL | Status: DC

## 2015-10-06 NOTE — Addendum Note (Signed)
Addended by: Damita LackLORING, Dwayn Moravek S on: 10/06/2015 08:06 AM   Modules accepted: Orders

## 2015-10-27 ENCOUNTER — Other Ambulatory Visit: Payer: Self-pay | Admitting: Family Medicine

## 2015-10-27 NOTE — Telephone Encounter (Signed)
Last office visit 06/24/2015.  Last refilled 08/31/2015 for #90 with no refills.  Ok to refill?

## 2015-10-28 MED ORDER — MELOXICAM 15 MG PO TABS: 15.0000 mg | ORAL_TABLET | Freq: Every day | ORAL | Status: DC

## 2015-12-14 ENCOUNTER — Encounter: Payer: Self-pay | Admitting: Family Medicine

## 2015-12-14 MED ORDER — SITAGLIPTIN PHOSPHATE 100 MG PO TABS: 100.0000 mg | ORAL_TABLET | Freq: Every day | ORAL | Status: DC

## 2015-12-14 NOTE — Telephone Encounter (Signed)
Ok, 90, 3 ref (generic)

## 2015-12-15 MED ORDER — PIOGLITAZONE HCL 15 MG PO TABS: 15.0000 mg | ORAL_TABLET | Freq: Every day | ORAL | Status: DC

## 2015-12-15 NOTE — Telephone Encounter (Signed)
Please call patient:  There is no generic form of this medication.  If cost is a significant issue, we can try some of the older medications in substitute.   Keep taking metformin, 4 tabs a day  Increase glipizide to 2 tablets a day  Start (send in) Actos 15 mg, 1 po daily, #90, 1 refill  Check BS twice a day, once fasting and once 2 hours after a meal.  F/u in 2 months

## 2015-12-15 NOTE — Addendum Note (Signed)
Addended by: Damita LackLORING, DONNA S on: 12/15/2015 10:20 AM   Modules accepted: Orders

## 2016-03-21 ENCOUNTER — Telehealth: Payer: Self-pay | Admitting: Family Medicine

## 2016-03-21 ENCOUNTER — Other Ambulatory Visit: Payer: Self-pay | Admitting: *Deleted

## 2016-03-21 MED ORDER — LISINOPRIL-HYDROCHLOROTHIAZIDE 20-12.5 MG PO TABS: ORAL_TABLET | ORAL | 0 refills | Status: DC

## 2016-03-21 MED ORDER — MELOXICAM 15 MG PO TABS: 15.0000 mg | ORAL_TABLET | Freq: Every day | ORAL | 0 refills | Status: DC

## 2016-03-21 MED ORDER — GLIPIZIDE ER 10 MG PO TB24: ORAL_TABLET | ORAL | 0 refills | Status: DC

## 2016-03-21 MED ORDER — PANTOPRAZOLE SODIUM 40 MG PO TBEC: DELAYED_RELEASE_TABLET | ORAL | 0 refills | Status: DC

## 2016-03-21 MED ORDER — PENTOXIFYLLINE ER 400 MG PO TBCR: 400.0000 mg | EXTENDED_RELEASE_TABLET | Freq: Three times a day (TID) | ORAL | 0 refills | Status: DC

## 2016-03-21 MED ORDER — METFORMIN HCL ER 500 MG PO TB24: ORAL_TABLET | ORAL | 0 refills | Status: DC

## 2016-03-21 MED ORDER — ROPINIROLE HCL 0.5 MG PO TABS: 0.5000 mg | ORAL_TABLET | Freq: Every day | ORAL | 0 refills | Status: DC

## 2016-03-21 MED ORDER — SIMVASTATIN 40 MG PO TABS: ORAL_TABLET | ORAL | 0 refills | Status: DC

## 2016-03-21 NOTE — Telephone Encounter (Signed)
Last office visit 06/24/2015.  AVS states to follow up in 6 months.  No future appointments.  Refill?

## 2016-03-21 NOTE — Telephone Encounter (Signed)
Please call and schedule CPE with fasting labs prior for Dr. Copland.  

## 2016-03-21 NOTE — Telephone Encounter (Signed)
Ok to refill all, but 3 months only, 0 refills  F/u CPX with me.  DM poorly controlled

## 2016-03-22 NOTE — Telephone Encounter (Signed)
Appointment scheduled for 04/20/2016.  [name] is requesting lab orders be sent to him.

## 2016-03-22 NOTE — Telephone Encounter (Signed)
LVM for pt to call back and schedule CPE with Dr. Patsy Lageropland with labs prior

## 2016-03-23 ENCOUNTER — Other Ambulatory Visit: Payer: Self-pay | Admitting: Family Medicine

## 2016-03-23 MED ORDER — NONFORMULARY OR COMPOUNDED ITEM: 0 refills | Status: DC

## 2016-03-23 NOTE — Telephone Encounter (Signed)
Done and in Donna's box

## 2016-03-24 NOTE — Telephone Encounter (Signed)
Lab orders mailed to [name].

## 2016-04-07 ENCOUNTER — Other Ambulatory Visit: Payer: Self-pay | Admitting: Family Medicine

## 2016-04-08 ENCOUNTER — Other Ambulatory Visit: Payer: Self-pay | Admitting: Family Medicine

## 2016-04-09 LAB — CBC WITH DIFFERENTIAL/PLATELET
Basophils Absolute: 0 10*3/uL (ref 0.0–0.2)
Basos: 0 %
EOS (ABSOLUTE): 0.2 10*3/uL (ref 0.0–0.4)
EOS: 3 %
HEMATOCRIT: 46.6 % (ref 37.5–51.0)
Hemoglobin: 16.2 g/dL (ref 12.6–17.7)
IMMATURE GRANULOCYTES: 0 %
Immature Grans (Abs): 0 10*3/uL (ref 0.0–0.1)
LYMPHS: 23 %
Lymphocytes Absolute: 1.8 10*3/uL (ref 0.7–3.1)
MCH: 30.4 pg (ref 26.6–33.0)
MCHC: 34.8 g/dL (ref 31.5–35.7)
MCV: 87 fL (ref 79–97)
MONOCYTES: 10 %
MONOS ABS: 0.8 10*3/uL (ref 0.1–0.9)
NEUTROS PCT: 64 %
Neutrophils Absolute: 5.2 10*3/uL (ref 1.4–7.0)
Platelets: 187 10*3/uL (ref 150–379)
RBC: 5.33 x10E6/uL (ref 4.14–5.80)
RDW: 13.6 % (ref 12.3–15.4)
WBC: 8.1 10*3/uL (ref 3.4–10.8)

## 2016-04-09 LAB — HEPATIC FUNCTION PANEL
ALBUMIN: 4.4 g/dL (ref 3.5–5.5)
ALT: 26 IU/L (ref 0–44)
AST: 16 IU/L (ref 0–40)
Alkaline Phosphatase: 44 IU/L (ref 39–117)
BILIRUBIN TOTAL: 0.3 mg/dL (ref 0.0–1.2)
Bilirubin, Direct: 0.13 mg/dL (ref 0.00–0.40)
TOTAL PROTEIN: 6.6 g/dL (ref 6.0–8.5)

## 2016-04-09 LAB — LIPID PANEL W/O CHOL/HDL RATIO
Cholesterol, Total: 167 mg/dL (ref 100–199)
HDL: 30 mg/dL — AB (ref >39)
LDL Calculated: 70 mg/dL (ref 0–99)
TRIGLYCERIDES: 336 mg/dL — AB (ref 0–149)
VLDL Cholesterol Cal: 67 mg/dL — ABNORMAL HIGH (ref 5–40)

## 2016-04-09 LAB — BASIC METABOLIC PANEL
BUN/Creatinine Ratio: 23 — ABNORMAL HIGH (ref 9–20)
BUN: 22 mg/dL (ref 6–24)
CALCIUM: 9.9 mg/dL (ref 8.7–10.2)
CHLORIDE: 99 mmol/L (ref 96–106)
CO2: 23 mmol/L (ref 18–29)
Creatinine, Ser: 0.96 mg/dL (ref 0.76–1.27)
GFR calc Af Amer: 102 mL/min/{1.73_m2} (ref >59)
GFR, EST NON AFRICAN AMERICAN: 89 mL/min/{1.73_m2} (ref >59)
Glucose: 207 mg/dL — ABNORMAL HIGH (ref 65–99)
POTASSIUM: 4.6 mmol/L (ref 3.5–5.2)
Sodium: 141 mmol/L (ref 134–144)

## 2016-04-09 LAB — PSA: Prostate Specific Ag, Serum: 0.3 ng/mL (ref 0.0–4.0)

## 2016-04-09 LAB — MICROALBUMIN, URINE: MICROALBUM., U, RANDOM: 43.2 ug/mL

## 2016-04-09 LAB — HGB A1C W/O EAG: Hgb A1c MFr Bld: 9 % — ABNORMAL HIGH (ref 4.8–5.6)

## 2016-04-20 ENCOUNTER — Encounter: Payer: Self-pay | Admitting: Family Medicine

## 2016-04-20 ENCOUNTER — Ambulatory Visit (INDEPENDENT_AMBULATORY_CARE_PROVIDER_SITE_OTHER): Payer: 59 | Admitting: Family Medicine

## 2016-04-20 ENCOUNTER — Telehealth: Payer: Self-pay | Admitting: Family Medicine

## 2016-04-20 VITALS — BP 128/80 | HR 91 | Temp 97.9°F | Ht 71.0 in | Wt 357.0 lb

## 2016-04-20 DIAGNOSIS — Z23 Encounter for immunization: Secondary | ICD-10-CM | POA: Diagnosis not present

## 2016-04-20 DIAGNOSIS — E784 Other hyperlipidemia: Secondary | ICD-10-CM | POA: Diagnosis not present

## 2016-04-20 DIAGNOSIS — Z0000 Encounter for general adult medical examination without abnormal findings: Secondary | ICD-10-CM

## 2016-04-20 DIAGNOSIS — E11622 Type 2 diabetes mellitus with other skin ulcer: Secondary | ICD-10-CM | POA: Diagnosis not present

## 2016-04-20 DIAGNOSIS — E7849 Other hyperlipidemia: Secondary | ICD-10-CM

## 2016-04-20 DIAGNOSIS — I10 Essential (primary) hypertension: Secondary | ICD-10-CM

## 2016-04-20 DIAGNOSIS — L97929 Non-pressure chronic ulcer of unspecified part of left lower leg with unspecified severity: Secondary | ICD-10-CM

## 2016-04-20 DIAGNOSIS — Z1211 Encounter for screening for malignant neoplasm of colon: Secondary | ICD-10-CM

## 2016-04-20 MED ORDER — GLIPIZIDE ER 10 MG PO TB24: 20.0000 mg | ORAL_TABLET | Freq: Every day | ORAL | 3 refills | Status: DC

## 2016-04-20 MED ORDER — PAROXETINE HCL 30 MG PO TABS: ORAL_TABLET | ORAL | 1 refills | Status: DC

## 2016-04-20 MED ORDER — SILDENAFIL CITRATE 20 MG PO TABS: ORAL_TABLET | ORAL | 5 refills | Status: DC

## 2016-04-20 MED ORDER — BAYER MICROLET LANCETS MISC: 3 refills | Status: DC

## 2016-04-20 MED ORDER — GLUCOSE BLOOD VI STRP: ORAL_STRIP | 3 refills | Status: DC

## 2016-04-20 MED ORDER — OXYCODONE-ACETAMINOPHEN 10-325 MG PO TABS: 1.0000 | ORAL_TABLET | ORAL | 0 refills | Status: DC | PRN

## 2016-04-20 MED ORDER — PIOGLITAZONE HCL 30 MG PO TABS: 30.0000 mg | ORAL_TABLET | Freq: Every day | ORAL | 1 refills | Status: DC

## 2016-04-20 NOTE — Patient Instructions (Signed)

## 2016-04-20 NOTE — Telephone Encounter (Signed)
Pt has a 3 month dm follow up with dr copland 07/27/16.  He would like lab order mailed to his home address the first of feb so he can go to [facility] to have his labs done

## 2016-04-20 NOTE — Progress Notes (Signed)
Dr. Frederico Hamman T. Lindzy Rupert, MD, [facility] [place], [address] Phone: [phone] Fax: [phone]  04/20/2016  Patient: [name] [name], MRN: [id number], DOB: 1961-03-29, 55 y.o.  Primary Physician:  Owens Loffler, MD   Chief Complaint  Patient presents with  . Annual Exam  . Flu Vaccine  . Medication Refill    percocet, glipizide, lancets and strips   Subjective:   [name] [name] is a 55 y.o. pleasant patient who presents with the following:  Preventative Health Maintenance Visit:  Health Maintenance Summary Reviewed and updated, unless pt declines services.  Tobacco History Reviewed. Alcohol: No concerns, no excessive use Exercise Habits: Some activity, rec at least 30 mins 5 times a week STD concerns: no risk or activity to increase risk Drug Use: None Encouraged self-testicular check  Pneumovax Flu Tdap  Diabetes Mellitus: Tolerating Medications: yes. He didn't like an air, did not increase his glipizide dosing as I had recommended previously, he just realizes care approximately one month ago. This was at a time and we started him on Actos as well.  Compliance with diet: fair Exercise: minimal / intermittent Avg blood sugars at home: 180-200 Foot problems: none Hypoglycemia: none No nausea, vomitting, blurred vision, polyuria.  Lab Results  Component Value Date   HGBA1C 9.0 (H) 04/08/2016   HGBA1C 9.0 (H) 06/19/2015   HGBA1C 7.2 (H) 12/24/2014   Lab Results  Component Value Date   MICROALBUR 2.82 (H) 07/27/2007   LDLCALC 70 04/08/2016   CREATININE 0.96 04/08/2016    Wt Readings from Last 3 Encounters:  04/20/16 (!) 357 lb (161.9 kg)  06/24/15 (!) 361 lb 4 oz (163.9 kg)  12/24/14 (!) 355 lb (161 kg)    Body mass index is 49.79 kg/m.   HTN: Tolerating all medications without side effects Stable and at goal No CP, no sob. No HA.  BP Readings from Last 3 Encounters:  04/20/16 128/80   06/24/15 130/82  12/24/14 496/75    Basic Metabolic Panel:    Component Value Date/Time   NA 141 04/08/2016 0722   K 4.6 04/08/2016 0722   CL 99 04/08/2016 0722   CO2 23 04/08/2016 0722   BUN 22 04/08/2016 0722   CREATININE 0.96 04/08/2016 0722   GLUCOSE 207 (H) 04/08/2016 0722   GLUCOSE 158 (H) 06/18/2014 0923   CALCIUM 9.9 04/08/2016 0722    Lipids: Doing well, stable. Tolerating meds fine with no SE. Panel reviewed with patient.  Lipids:    Component Value Date/Time   CHOL 167 04/08/2016 0722   TRIG 336 (H) 04/08/2016 0722   HDL 30 (L) 04/08/2016 0722   LDLDIRECT 76 02/19/2014 1635   CHOLHDL 4.6 06/19/2015 0730    Lab Results  Component Value Date   ALT 26 04/08/2016   AST 16 04/08/2016   ALKPHOS 44 04/08/2016   BILITOT 0.3 04/08/2016     Health Maintenance  Topic Date Due  . Hepatitis C Screening  Mar 11, 1961  . HIV Screening  03/24/1976  . COLONOSCOPY  03/25/2011  . PNEUMOCOCCAL POLYSACCHARIDE VACCINE (2) 11/25/2015  . INFLUENZA VACCINE  12/29/2015  . HEMOGLOBIN A1C  10/06/2016  . FOOT EXAM  04/20/2017  . OPHTHALMOLOGY EXAM  04/20/2017  . TETANUS/TDAP  04/20/2026   Immunization History  Administered Date(s) Administered  . Influenza,inj,Quad PF,36+ Mos 04/20/2016  . Influenza-Unspecified 03/31/2015  . Pneumococcal Polysaccharide-23 11/25/2010, 04/20/2016  . Tdap 04/20/2016   Patient Active Problem List   Diagnosis Date  Noted  . Diabetic leg ulcer ([facility]) 07/18/2013    Priority: High  . 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 Morbid obesity with BMI of 50.0-59.9, adult ([place]) 09/29/2006    Priority: High  . Hyperlipidemia LDL goal <70 01/16/2009    Priority: Medium  . TOBACCO ABUSE 07/27/2007    Priority: Medium  . Essential hypertension 09/29/2006    Priority: Medium  . BPH (benign prostatic hyperplasia) 12/24/2014  . IBS (irritable bowel syndrome) 12/24/2014  . 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 Restless legs syndrome (RLS) 11/20/2012  . Generalized anxiety disorder   . CALCULUS, KIDNEY 05/30/2002   Past Medical History:  Diagnosis Date  . Anxiety   . Depression   . Diabetes mellitus type II   . GERD (gastroesophageal reflux disease)   . HLD (hyperlipidemia)   . HTN (hypertension)   . Morbid obesity ([facility])   . Restless legs syndrome (RLS) 11/20/2012  . Sleep apnea    no tested  . Snores   . Tobacco abuse   . Wears glasses    Past Surgical History:  Procedure Laterality Date  . Arm surgery Right 1969   Abscess excision and debridement at elbow  . I&D EXTREMITY Left 05/29/2014   Procedure: IRRIGATION AND DEBRIDEMENT OF LEFT LEG WOUND AND PLACEMENT OF  INTEGRA  AND  VAC;  Surgeon: Theodoro Kos, DO;  Location: [facility];  Service: Plastics;  Laterality: Left;  . INCISION AND DRAINAGE OF WOUND Left 04/23/2014   Procedure: IRRIGATION AND DEBRIDEMENT LOWER LEFT LEG WOUND WITH PLACEMENT OF INTEGRA AND VAC;  Surgeon: Theodoro Kos, DO;  Location: [facility];  Service: Plastics;  Laterality: Left;  . kidney stone removal  2004  . MINOR APPLICATION OF WOUND VAC Left 04/23/2014   Procedure: MINOR APPLICATION OF WOUND VAC;  Surgeon: Theodoro Kos, DO;  Location: [facility];  Service: Plastics;  Laterality: Left;  . SKIN SPLIT GRAFT Left 06/18/2014   Procedure: SKIN GRAFT SPLIT THICKNESS TO LOWER LEFT LEG WOUND WITH PLACEMENT OF VAC;  Surgeon: Theodoro Kos, DO;  Location: [facility];  Service: Plastics;  Laterality: Left;  Marland Kitchen VASECTOMY  1994   Social History   Social History  . Marital status: Married    Spouse name: N/A  . Number of children: 3  . Years of education: N/A   Occupational History  . [employer]ing and receivables    Social History Main Topics  . Smoking status: Current Every Day Smoker    Packs/day: 0.25    Years: 75.00    Types: Cigarettes  .  Smokeless tobacco: Never Used     Comment: one cigarette daily  . Alcohol use 0.0 oz/week     Comment: rarely  . Drug use: No  . Sexual activity: Yes    Partners: Female   Other Topics Concern  . Not on file   Social History Narrative  . No narrative on file   Family History  Problem Relation Age of Onset  . Stroke Mother     multiple  . Leukemia Mother   . Pneumonia Mother   . Cancer Mother   . Diabetes Mother   . Diabetes Father   . Heart disease Father   . Hyperlipidemia Father   . Hypertension Father    Allergies  Allergen Reactions  . Betadine [Povidone  Iodine]     Raised rash 04/23/14  . Latex Dermatitis  . Penicillins     REACTION: unspecified  . Lidocaine Other (See Comments)    Minimal effect with lidocaine, prefers different anesthetic    Medication list has been reviewed and updated.   General: Denies fever, chills, sweats. No significant weight loss. Eyes: Denies blurring,significant itching ENT: Denies earache, sore throat, and hoarseness. Cardiovascular: Denies chest pains, palpitations, dyspnea on exertion Respiratory: Denies cough, dyspnea at rest,wheeezing Breast: no concerns about lumps GI: Denies nausea, vomiting, diarrhea, constipation, change in bowel habits, abdominal pain, melena, hematochezia GU: Denies penile discharge, ED, urinary flow / outflow problems. No STD concerns. Musculoskeletal: Denies back pain, joint pain Derm: Denies rash, itching Neuro: Denies  paresthesias, frequent falls, frequent headaches Psych: Denies depression, anxiety Endocrine: Denies cold intolerance, heat intolerance, polydipsia Heme: Denies enlarged lymph nodes Allergy: No hayfever  Objective:   BP 128/80   Pulse 91   Temp 97.9 F (36.6 C) (Oral)   Ht _0  (1.803 m)   Wt (!) 357 lb (161.9 kg)   SpO2 97%   BMI 49.79 kg/m  Ideal Body Weight: Weight in (lb) to have BMI = 25: 178.9  Vision Screening Comments: [facility] 04/15/2016  GEN: well  developed, well nourished, no acute distress Eyes: conjunctiva and lids normal, PERRLA, EOMI ENT: TM clear, nares clear, oral exam WNL Neck: supple, no lymphadenopathy, no thyromegaly, no JVD Pulm: clear to auscultation and percussion, respiratory effort normal CV: regular rate and rhythm, S1-S2, no murmur, rub or gallop, no bruits, peripheral pulses normal and symmetric, no cyanosis, clubbing, edema or varicosities GI: soft, non-tender; no hepatosplenomegaly, masses; active bowel sounds all quadrants GU: no hernia, testicular mass, penile discharge Lymph: no cervical, axillary or inguinal adenopathy MSK: gait normal, muscle tone and strength WNL, no joint swelling, effusions, discoloration, crepitus  SKIN: clear, good turgor, color WNL, no rashes, lesions, or ulcerations Neuro: normal mental status, normal strength, sensation, and motion Psych: alert; oriented to person, place and time, normally interactive and not anxious or depressed in appearance. All labs reviewed with patient.  Lipids:    Component Value Date/Time   CHOL 167 04/08/2016 0722   TRIG 336 (H) 04/08/2016 0722   HDL 30 (L) 04/08/2016 0722   LDLDIRECT 76 02/19/2014 1635   CHOLHDL 4.6 06/19/2015 0730   CBC: CBC Latest Ref Rng & Units 04/08/2016 12/24/2014 06/18/2014  WBC 3.4 - 10.8 x10E3/uL 8.1 10.5 -  Hemoglobin 13.0 - 17.0 g/dL - - 13.6  Hematocrit 37.5 - 51.0 % 46.6 48.0 40.0  Platelets 150 - 379 x10E3/uL 187 238 -    Basic Metabolic Panel:    Component Value Date/Time   NA 141 04/08/2016 0722   K 4.6 04/08/2016 0722   CL 99 04/08/2016 0722   CO2 23 04/08/2016 0722   BUN 22 04/08/2016 0722   CREATININE 0.96 04/08/2016 0722   GLUCOSE 207 (H) 04/08/2016 0722   GLUCOSE 158 (H) 06/18/2014 0923   CALCIUM 9.9 04/08/2016 0722   Hepatic Function Latest Ref Rng & Units 04/08/2016 06/19/2015 12/24/2014  Total Protein 6.0 - 8.5 g/dL 6.6 6.4 6.8  Albumin 3.5 - 5.5 g/dL 4.4 4.3 4.5  AST 0 - 40 IU/L _1 ALT 0 -  44 IU/L 26 32 23  Alk Phosphatase 39 - 117 IU/L 44 44 52  Total Bilirubin 0.0 - 1.2 mg/dL 0.3 0.3 0.3  Bilirubin, Direct 0.00 - 0.40 mg/dL 0.13 0.11 0.12  Lab Results  Component Value Date   TSH 1.022 07/27/2007   Lab Results  Component Value Date   PSA 0.3 02/19/2014   PSA 0.2 11/15/2012   PSA 0.3 04/09/2010    Assessment and Plan:   Health Maintenance Exam: The patient's preventative maintenance and recommended screening tests for an annual wellness exam were reviewed in full today. Brought up to date unless services declined.  Counselled on the importance of diet, exercise, and its role in overall health and mortality. The patient's FH and SH was reviewed, including their home life, tobacco status, and drug and alcohol status.  Follow-up in 1 year for physical exam or additional follow-up below.  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 BMI is less than 50 now.  A1c is up to 9. Previously he was eating poorly, and he wasn't taking his medication correctly. Increase glipizide dosing as well as increase his Actos dosing.  Maintain hypertension and lipid medication.   Cont to work on weight loss. He has lost 2 pants sizes, and I congratulated him.  Follow-up: 3 months  Meds ordered this encounter  Medications  . oxyCODONE-acetaminophen (PERCOCET) 10-325 MG tablet    Sig: Take 1 tablet by mouth every 4 (four) hours as needed.    Dispense:  50 tablet    Refill:  0  . glipiZIDE  (GLUCOTROL XL) 10 MG 24 hr tablet    Sig: Take 2 tablets (20 mg total) by mouth daily with breakfast. TAKE 1 BY MOUTH DAILY    Dispense:  180 tablet    Refill:  3  . glucose blood (BAYER CONTOUR NEXT TEST) test strip    Sig: Test two times daily or as instructed.  Dx: E11.621    Dispense:  200 each    Refill:  3  . BAYER MICROLET LANCETS lancets    Sig: Use to test twice daily or as directed. E11.621    Dispense:  200 each    Refill:  3  . pioglitazone (ACTOS) 30 MG tablet    Sig: Take 1 tablet (30 mg total) by mouth daily.    Dispense:  90 tablet    Refill:  1  . PARoxetine (PAXIL) 30 MG tablet    Sig: TAKE 1 BY MOUTH EVERY MORNING    Dispense:  90 tablet    Refill:  1  . sildenafil (REVATIO) 20 MG tablet    Sig: Generic Revatio / Sildanefil 20 mg. 2 - 5 tabs 30 mins prior to intercourse.   Make sure he understands this is cash / credit card, not to be run through insurance.    Dispense:  20 tablet    Refill:  5   Medications Discontinued During This Encounter  Medication Reason  . PARoxetine (PAXIL) 20 MG tablet Duplicate  . oxyCODONE-acetaminophen (PERCOCET) 10-325 MG tablet Reorder  . glipiZIDE (GLUCOTROL XL) 10 MG 24 hr tablet Reorder  . glucose blood (BAYER CONTOUR NEXT TEST) test strip Reorder  . BAYER MICROLET LANCETS lancets Reorder  . pioglitazone (ACTOS) 15 MG tablet Reorder  .  meloxicam (MOBIC) 15 MG tablet   . PARoxetine (PAXIL) 20 MG tablet Reorder  . sitaGLIPtin (JANUVIA) 100 MG tablet    Orders Placed This Encounter  Procedures  . Tdap vaccine greater than or equal to 7yo IM  . Flu Vaccine QUAD 36+ mos IM  . Pneumococcal polysaccharide vaccine 23-valent greater than or equal to 2yo subcutaneous/IM  . Ambulatory referral to Gastroenterology    Signed,  Frederico Hamman T. Tristram Milian, MD     Medication List       Accurate as of 04/20/16  2:07 PM. Always use your most recent med list.          aspirin 81 MG tablet Take 81 mg by mouth daily.   BAYER  MICROLET LANCETS lancets Use to test twice daily or as directed. Z61.096   ECHINACEA PO Take by mouth daily.   fish oil-omega-3 fatty acids 1000 MG capsule Take 1 g by mouth daily.   Fluocinonide 0.1 % Crea   glipiZIDE 10 MG 24 hr tablet Commonly known as:  GLUCOTROL XL Take 2 tablets (20 mg total) by mouth daily with breakfast. TAKE 1 BY MOUTH DAILY   glucosamine-chondroitin 500-400 MG tablet Take 1 tablet by mouth daily.   glucose blood test strip Commonly known as:  BAYER CONTOUR NEXT TEST Test two times daily or as instructed.  Dx: E11.621   lisinopril-hydrochlorothiazide 20-12.5 MG tablet Commonly known as:  PRINZIDE,ZESTORETIC TAKE 2 BY MOUTH DAILY   meloxicam 15 MG tablet Commonly known as:  MOBIC Take 1 tablet (15 mg total) by mouth daily.   metFORMIN 500 MG 24 hr tablet Commonly known as:  GLUCOPHAGE-XR TAKE 4 BY MOUTH DAILY WITH BREAKFAST   multivitamin tablet Take 1 tablet by mouth daily.   NONFORMULARY OR COMPOUNDED ITEM Epic Account: [id number] FLP, E78.5 A1c, E11.9 Urine microalbumin, E11.9 Cbc with diff, HFP, BMET: Z79.899 PSA: Z12.5   oxyCODONE-acetaminophen 10-325 MG tablet Commonly known as:  PERCOCET Take 1 tablet by mouth every 4 (four) hours as needed.   pantoprazole 40 MG tablet Commonly known as:  PROTONIX TAKE 1 TABLET (40 MG TOTAL) BY MOUTH DAILY.   PARoxetine 30 MG tablet Commonly known as:  PAXIL TAKE 1 BY MOUTH EVERY MORNING   pentoxifylline 400 MG CR tablet Commonly known as:  TRENTAL Take 1 tablet (400 mg total) by mouth 3 (three) times daily with meals.   pioglitazone 30 MG tablet Commonly known as:  ACTOS Take 1 tablet (30 mg total) by mouth daily.   rOPINIRole 0.5 MG tablet Commonly known as:  REQUIP Take 1 tablet (0.5 mg total) by mouth at bedtime.   sildenafil 20 MG tablet Commonly known as:  REVATIO Generic Revatio / Sildanefil 20 mg. 2 - 5 tabs 30 mins prior to intercourse.   Make sure he understands this is cash /  credit card, not to be run through insurance.   simvastatin 40 MG tablet Commonly known as:  ZOCOR Take 1 tablet by mouth at  bedtime   vitamin C 500 MG tablet Commonly known as:  ASCORBIC ACID Take 500 mg by mouth daily.

## 2016-04-25 ENCOUNTER — Other Ambulatory Visit: Payer: Self-pay | Admitting: Family Medicine

## 2016-04-25 MED ORDER — GLIPIZIDE ER 10 MG PO TB24: 20.0000 mg | ORAL_TABLET | Freq: Every day | ORAL | 3 refills | Status: DC

## 2016-04-25 MED ORDER — NONFORMULARY OR COMPOUNDED ITEM: 0 refills | Status: DC

## 2016-04-25 NOTE — Telephone Encounter (Signed)
Done - to Mrs. Loring's attention.

## 2016-04-25 NOTE — Telephone Encounter (Signed)
Lab orders mailed to patient's home address as requested.

## 2016-04-25 NOTE — Addendum Note (Signed)
Addended by: Damita LackLORING, Burnis Kaser S on: 04/25/2016 10:46 AM   Modules accepted: Orders

## 2016-04-27 ENCOUNTER — Other Ambulatory Visit: Payer: Self-pay | Admitting: *Deleted

## 2016-04-27 MED ORDER — GLUCOSE BLOOD VI STRP: ORAL_STRIP | 3 refills | Status: DC

## 2016-04-27 MED ORDER — ONETOUCH VERIO FLEX SYSTEM W/DEVICE KIT: 1.0000 | PACK | Freq: Two times a day (BID) | 0 refills | Status: DC

## 2016-04-27 MED ORDER — ONETOUCH DELICA LANCETS 33G MISC: 1.0000 | Freq: Two times a day (BID) | 3 refills | Status: DC

## 2016-05-16 ENCOUNTER — Encounter: Payer: Self-pay | Admitting: Gastroenterology

## 2016-06-05 ENCOUNTER — Other Ambulatory Visit: Payer: Self-pay | Admitting: Family Medicine

## 2016-06-06 ENCOUNTER — Emergency Department (HOSPITAL_COMMUNITY)
Admission: EM | Admit: 2016-06-06 | Discharge: 2016-06-06 | Disposition: A | Discharge status: Home / Self Care | Payer: 59 | Attending: Emergency Medicine | Admitting: Emergency Medicine

## 2016-06-06 ENCOUNTER — Encounter (HOSPITAL_COMMUNITY): Payer: Self-pay | Admitting: Emergency Medicine

## 2016-06-06 DIAGNOSIS — F17210 Nicotine dependence, cigarettes, uncomplicated: Secondary | ICD-10-CM | POA: Diagnosis not present

## 2016-06-06 DIAGNOSIS — T7809XA Anaphylactic reaction due to other food products, initial encounter: Secondary | ICD-10-CM | POA: Insufficient documentation

## 2016-06-06 DIAGNOSIS — Z7982 Long term (current) use of aspirin: Secondary | ICD-10-CM | POA: Insufficient documentation

## 2016-06-06 DIAGNOSIS — E119 Type 2 diabetes mellitus without complications: Secondary | ICD-10-CM | POA: Diagnosis not present

## 2016-06-06 DIAGNOSIS — T781XXA Other adverse food reactions, not elsewhere classified, initial encounter: Secondary | ICD-10-CM | POA: Diagnosis present

## 2016-06-06 DIAGNOSIS — I10 Essential (primary) hypertension: Secondary | ICD-10-CM | POA: Diagnosis not present

## 2016-06-06 DIAGNOSIS — Z79899 Other long term (current) drug therapy: Secondary | ICD-10-CM | POA: Insufficient documentation

## 2016-06-06 DIAGNOSIS — Z91040 Latex allergy status: Secondary | ICD-10-CM | POA: Diagnosis not present

## 2016-06-06 DIAGNOSIS — Z7984 Long term (current) use of oral hypoglycemic drugs: Secondary | ICD-10-CM | POA: Diagnosis not present

## 2016-06-06 DIAGNOSIS — T782XXA Anaphylactic shock, unspecified, initial encounter: Secondary | ICD-10-CM

## 2016-06-06 MED ORDER — DIPHENHYDRAMINE HCL 50 MG/ML IJ SOLN
25.0000 mg | Freq: Once | INTRAMUSCULAR | Status: AC
  Administered 2016-06-06: 25 mg via INTRAVENOUS
  Filled 2016-06-06: qty 1

## 2016-06-06 MED ORDER — IPRATROPIUM-ALBUTEROL 0.5-2.5 (3) MG/3ML IN SOLN
3.0000 mL | Freq: Once | RESPIRATORY_TRACT | Status: AC
  Administered 2016-06-06: 3 mL via RESPIRATORY_TRACT
  Filled 2016-06-06: qty 3

## 2016-06-06 MED ORDER — EPINEPHRINE 0.3 MG/0.3ML IJ SOAJ: 0.3000 mg | Freq: Once | INTRAMUSCULAR | 0 refills | Status: DC

## 2016-06-06 MED ORDER — EPINEPHRINE 0.3 MG/0.3ML IJ SOAJ
0.3000 mg | Freq: Once | INTRAMUSCULAR | Status: AC
  Administered 2016-06-06: 0.3 mg via INTRAMUSCULAR
  Filled 2016-06-06: qty 0.3

## 2016-06-06 MED ORDER — METHYLPREDNISOLONE SODIUM SUCC 125 MG IJ SOLR
125.0000 mg | Freq: Once | INTRAMUSCULAR | Status: AC
  Administered 2016-06-06: 125 mg via INTRAVENOUS
  Filled 2016-06-06: qty 2

## 2016-06-06 MED ORDER — SODIUM CHLORIDE 0.9 % IV BOLUS (SEPSIS)
1000.0000 mL | Freq: Once | INTRAVENOUS | Status: AC
  Administered 2016-06-06: 1000 mL via INTRAVENOUS

## 2016-06-06 MED ORDER — PREDNISONE 20 MG PO TABS: ORAL_TABLET | ORAL | 0 refills | Status: DC

## 2016-06-06 NOTE — ED Notes (Signed)
Patient states the itching rash has reappeared to right upper arm. 100% on RA. Denies shortness of breath. PA made aware.

## 2016-06-06 NOTE — ED Triage Notes (Signed)
Per EMS, patient from work, c/o acute onset of bilateral hand itching and redness to face and arms. All symptoms have resolved at this time. Denies SOB, chest pain, and throat swelling. Hx anxiety. 20g L AC.

## 2016-06-06 NOTE — ED Provider Notes (Signed)
[facility] DEPT Provider Note   CSN: [id number] Arrival date & time: 06/06/16  1505   By signing my name below, I, Delton Prairie, attest that this documentation has been prepared under the direction and in the presence of  [employer], PA-C. Electronically Signed: Delton Prairie, ED Scribe. 06/06/16. 4:17 PM.   History   Chief Complaint Chief Complaint  Patient presents with  . Allergic Reaction    The history is provided by the patient and the spouse. No language interpreter was used.   HPI Comments:  [name] [name] is a 56 y.o. male, with a hx of anxiety, who presents to the Emergency Department complaining of sudden onset redness and associated itching to his bilateral upper extremities which began around 1:30 PM today. Spouse reports the pt's tongue has doubled in size and the pt also reports dizziness, lightheadedness, nausea, and SOB. Pt states he ate a new food (dried snap peas) and 2 hours later began to feel a burning, itching sensation to his arms which resided and suddenly returned. No alleviating factors noted. Pt denies a hx of similar allergic reactions, any other associated symptoms and any other modifying factors at this time. Pt is a smoker.     Past Medical History:  Diagnosis Date  . Anxiety   . Depression   . Diabetes mellitus type II   . GERD (gastroesophageal reflux disease)   . HLD (hyperlipidemia)   . HTN (hypertension)   . Morbid obesity ([place])   . Restless legs syndrome (RLS) 11/20/2012  . Sleep apnea    no tested  . Snores   . Tobacco abuse   . Wears glasses     Patient Active Problem List   Diagnosis Date Noted  . BPH (benign prostatic hyperplasia) 12/24/2014  . IBS (irritable bowel syndrome) 12/24/2014  . 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 Diabetic leg ulcer ([place]) 07/18/2013  . Restless legs syndrome (RLS) 11/20/2012  . Generalized anxiety disorder   . Type 2 diabetes mellitus with diabetic ulcer of left  lower leg (HCC)   . Hyperlipidemia LDL goal <70 01/16/2009  . TOBACCO ABUSE 07/27/2007  . Morbid obesity with BMI of 50.0-59.9, adult ([place]) 09/29/2006  . Essential hypertension 09/29/2006  . CALCULUS, KIDNEY 05/30/2002    Past Surgical History:  Procedure Laterality Date  . Arm surgery Right 1969   Abscess excision and debridement at elbow  . I&D EXTREMITY Left 05/29/2014   Procedure: IRRIGATION AND DEBRIDEMENT OF LEFT LEG WOUND AND PLACEMENT OF  INTEGRA  AND  VAC;  Surgeon: Theodoro Kos, DO;  Location: [facility];  Service: Plastics;  Laterality: Left;  . INCISION AND DRAINAGE OF WOUND Left 04/23/2014   Procedure: IRRIGATION AND DEBRIDEMENT LOWER LEFT LEG WOUND WITH PLACEMENT OF INTEGRA AND VAC;  Surgeon: Theodoro Kos, DO;  Location: [place];  Service: Plastics;  Laterality: Left;  . kidney stone removal  2004  . MINOR APPLICATION OF WOUND VAC Left 04/23/2014   Procedure: MINOR APPLICATION OF WOUND VAC;  Surgeon: Theodoro Kos, DO;  Location: [place];  Service: Plastics;  Laterality: Left;  . SKIN SPLIT GRAFT Left 06/18/2014   Procedure: SKIN GRAFT SPLIT THICKNESS TO LOWER LEFT LEG WOUND WITH PLACEMENT OF VAC;  Surgeon: Theodoro Kos, DO;  Location: [place];  Service: Plastics;  Laterality: Left;  Marland Kitchen VASECTOMY  1994    Home Medications    Prior to Admission medications  Medication Sig Start Date End Date Taking? Authorizing Provider  aspirin 81 MG tablet Take 81 mg by mouth daily.      Historical Provider, MD  Blood Glucose Monitoring Suppl ([place]) w/Device KIT 1 each by Does not apply route 2 (two) times daily. Dx: E11.622/L97.929 04/27/16   Owens Loffler, MD  ECHINACEA PO Take by mouth daily.    Historical Provider, MD  fish oil-omega-3 fatty acids 1000 MG capsule Take 1 g by mouth daily.      Historical Provider, MD  Fluocinonide 0.1 % CREA  04/15/14   Historical Provider, MD  glipiZIDE  (GLUCOTROL XL) 10 MG 24 hr tablet Take 2 tablets (20 mg total) by mouth daily with breakfast. 04/25/16   Owens Loffler, MD  glucosamine-chondroitin 500-400 MG tablet Take 1 tablet by mouth daily.      Historical Provider, MD  glucose blood (ONETOUCH VERIO) test strip Use to check blood sugar two times a day.  Dx: E11.622/L97.929 04/27/16   Owens Loffler, MD  lisinopril-hydrochlorothiazide (PRINZIDE,ZESTORETIC) 20-12.5 MG tablet TAKE 2 TABLETS DAILY 06/05/16   Owens Loffler, MD  meloxicam (MOBIC) 15 MG tablet TAKE 1 TABLET DAILY 06/05/16   Owens Loffler, MD  metFORMIN (GLUCOPHAGE-XR) 500 MG 24 hr tablet TAKE 4 TABLETS DAILY WITH  BREAKFAST 06/05/16   Owens Loffler, MD  Multiple Vitamin (MULTIVITAMIN) tablet Take 1 tablet by mouth daily.      Historical Provider, MD  NONFORMULARY OR COMPOUNDED ITEM Epic Account: [id number] FLP, E78.5 A1c, E11.9 Urine microalbumin, E11.9 Cbc with diff, HFP, BMET: Z79.899 PSA: Z12.5 03/23/16   Owens Loffler, MD  NONFORMULARY OR COMPOUNDED ITEM Epic Account: [id number] Lab Studies: FLP, E78.5 A1c, E11.9 Urine microalbumin, E11.9 BMET: Z79.899 PSA: Z12.5 HIV: Z11.4 Hepatitis C: Z11.59 04/25/16   Owens Loffler, MD  [facility] DELICA LANCETS 35O MISC 1 each by Does not apply route 2 (two) times daily. E11.622/L97.929 04/27/16   Owens Loffler, MD  oxyCODONE-acetaminophen (PERCOCET) 10-325 MG tablet Take 1 tablet by mouth every 4 (four) hours as needed. 04/20/16   Owens Loffler, MD  pantoprazole (PROTONIX) 40 MG tablet TAKE 1 TABLET DAILY 06/05/16   Owens Loffler, MD  PARoxetine (PAXIL) 30 MG tablet TAKE 1 BY MOUTH EVERY MORNING 04/20/16   Owens Loffler, MD  pentoxifylline (TRENTAL) 400 MG CR tablet TAKE 1 TABLET 3 TIMES DAILYWITH MEALS 06/05/16   Owens Loffler, MD  pioglitazone (ACTOS) 30 MG tablet Take 1 tablet (30 mg total) by mouth daily. 04/20/16   Owens Loffler, MD  rOPINIRole (REQUIP) 0.5 MG tablet TAKE 1 TABLET AT BEDTIME 06/05/16   Owens Loffler,  MD  sildenafil (REVATIO) 20 MG tablet Generic Revatio / Sildanefil 20 mg. 2 - 5 tabs 30 mins prior to intercourse.   Make sure he understands this is cash / credit card, not to be run through insurance. 04/20/16   Owens Loffler, MD  simvastatin (ZOCOR) 40 MG tablet Take 1 tablet by mouth at  bedtime 03/21/16   Owens Loffler, MD  vitamin C (ASCORBIC ACID) 500 MG tablet Take 500 mg by mouth daily.    Historical Provider, MD    Family History Family History  Problem Relation Age of Onset  . Stroke Mother     multiple  . Leukemia Mother   . Pneumonia Mother   . Cancer Mother   . Diabetes Mother   . Diabetes Father   . Heart disease Father   . Hyperlipidemia Father   . Hypertension Father  Social History Social History  Substance Use Topics  . Smoking status: Current Every Day Smoker    Packs/day: 0.25    Years: 75.00    Types: Cigarettes  . Smokeless tobacco: Never Used     Comment: one cigarette daily  . Alcohol use 0.0 oz/week     Comment: rarely     Allergies   Betadine [povidone iodine]; Latex; Penicillins; and Lidocaine   Review of Systems Review of Systems  Constitutional: Negative for fever.  HENT:       Throat tightness  Respiratory: Positive for shortness of breath and wheezing.   Gastrointestinal: Positive for nausea.  Skin: Positive for color change and rash.       +skin itching  Neurological: Positive for dizziness and light-headedness. Negative for syncope.  Psychiatric/Behavioral: The patient is nervous/anxious.   All other systems reviewed and are negative.  Physical Exam Updated Vital Signs BP 116/72 (BP Location: Right Wrist)   Pulse 103   Temp 97.9 F (36.6 C)   Resp 18   SpO2 100%   Physical Exam  Constitutional: He is oriented to person, place, and time. He appears well-developed and well-nourished. He appears distressed.  Obese  HENT:  Head: Normocephalic and atraumatic.  Tongue swollen  Eyes: Conjunctivae are normal.    Cardiovascular: Normal rate and regular rhythm.  Exam reveals no gallop and no friction rub.   No murmur heard. Pulmonary/Chest: He is in respiratory distress (mild). He has wheezes (diffusely ). He has no rales. He exhibits no tenderness.  Abdominal: He exhibits no distension.  Neurological: He is alert and oriented to person, place, and time.  Skin: Skin is warm and dry. Rash noted.  Hives on arms bilaterally.   Psychiatric: His mood appears anxious.  Nursing note and vitals reviewed.   ED Treatments / Results  DIAGNOSTIC STUDIES:  Oxygen Saturation is 100% on RA, normal by my interpretation.    COORDINATION OF CARE:  4:05 PM Discussed treatment plan with pt at bedside and pt agreed to plan.  Labs (all labs ordered are listed, but only abnormal results are displayed) Labs Reviewed - No data to display  EKG  EKG Interpretation  Date/Time:  Monday June 06 2016 17:43:48 EST Ventricular Rate:  92 PR Interval:    QRS Duration: 112 QT Interval:  362 QTC Calculation: 448 R Axis:   -61 Text Interpretation:  Sinus rhythm Probable left atrial enlargement Incomplete right bundle branch block Abnormal R-wave progression, late transition Inferior infarct, old Lateral leads are also involved since last tracing no significant change Confirmed by BELFI  MD, MELANIE ([id number]) on 06/06/2016 5:47:18 PM       Radiology No results found.  Procedures Procedures (including critical care time)  Medications Ordered in ED Medications  ipratropium-albuterol (DUONEB) 0.5-2.5 (3) MG/3ML nebulizer solution 3 mL (3 mLs Nebulization Given 06/06/16 1611)  diphenhydrAMINE (BENADRYL) injection 25 mg (25 mg Intravenous Given 06/06/16 1611)  methylPREDNISolone sodium succinate (SOLU-MEDROL) 125 mg/2 mL injection 125 mg (125 mg Intravenous Given 06/06/16 1611)  EPINEPHrine (EPI-PEN) injection 0.3 mg (0.3 mg Intramuscular Given 06/06/16 1623)  sodium chloride 0.9 % bolus 1,000 mL (0 mLs Intravenous Stopped  06/06/16 1806)     Initial Impression / Assessment and Plan / ED Course  I have reviewed the triage vital signs and the nursing notes.  Pertinent labs & imaging results that were available during my care of the patient were reviewed by me and considered in my medical decision making (see chart for  details).  Clinical Course    56 year old male presents with anaphylaxis. He is tachypneic and in distress on exam. He is progressively hypotensive with manual BP reading of 90/50. He is not tachycardic or hypoxic. He has swelling of his airway and wheezing on exam with hives. EpiPen given. IVF, duoneb, benadryl, and steroids given. Will transfer patient to main ED due to acuity of condition. Shared visit with Dr. Tamera Punt who will assume care of patient.    Final Clinical Impressions(s) / ED Diagnoses   Final diagnoses:  Anaphylaxis, initial encounter    New Prescriptions New Prescriptions   No medications on file   I personally performed the services described in this documentation, which was scribed in my presence. The recorded information has been reviewed and is accurate.     Recardo Evangelist, PA-C 06/06/16 [place], MD 06/06/16 [phone]

## 2016-06-06 NOTE — ED Notes (Signed)
Pt reports swelling has decreased and is breathing well.

## 2016-06-06 NOTE — ED Notes (Signed)
ED Provider at bedside. 

## 2016-06-20 ENCOUNTER — Telehealth: Payer: Self-pay | Admitting: *Deleted

## 2016-06-20 NOTE — Telephone Encounter (Signed)
Patient's last weight was 357 lbs on 04/20/16. Patient also has multiple health concerns. He is for screening colonoscopy. Okay for patient to have direct hospital screening colonoscopy or would you like office visit first? Please advise. Thank you, Rob[place],PV

## 2016-06-20 NOTE — Telephone Encounter (Signed)
Office visit with APP, please

## 2016-06-21 NOTE — Telephone Encounter (Signed)
Left message for patient to call office and schedule an appointment to see one of the APP prior to being scheduled for hospital colonoscopy.

## 2016-07-01 ENCOUNTER — Encounter: Payer: Self-pay | Admitting: Gastroenterology

## 2016-07-01 ENCOUNTER — Ambulatory Visit (INDEPENDENT_AMBULATORY_CARE_PROVIDER_SITE_OTHER): Payer: 59 | Admitting: Gastroenterology

## 2016-07-01 VITALS — BP 112/64 | HR 84 | Ht 71.0 in | Wt 355.0 lb

## 2016-07-01 DIAGNOSIS — Z1211 Encounter for screening for malignant neoplasm of colon: Secondary | ICD-10-CM

## 2016-07-01 NOTE — Progress Notes (Addendum)
07/01/2016 Atlas Crossland [id number] [birth year]   HISTORY OF PRESENT ILLNESS:  This is a pleasant 56 year old male who is new to our office. He is here to discuss screening colonoscopy. His weight is 355 pounds so exceeds the weight limit for our guidelines and requirements for the [facility].  Patient denies any GI complaints. He has past history of hypertension, hyperlipidemia, diabetes mellitus, restless leg syndrome, anxiety, depression, sleep apnea, morbid obesity.   Past Medical History:  Diagnosis Date  . Anxiety   . Depression   . Diabetes mellitus type II   . GERD (gastroesophageal reflux disease)   . HLD (hyperlipidemia)   . HTN (hypertension)   . Morbid obesity ([place])   . Restless legs syndrome (RLS) 11/20/2012  . Sleep apnea    no tested  . Snores   . Tobacco abuse   . Wears glasses    Past Surgical History:  Procedure Laterality Date  . Arm surgery Right 1969   Abscess excision and debridement at elbow  . I&D EXTREMITY Left 05/29/2014   Procedure: IRRIGATION AND DEBRIDEMENT OF LEFT LEG WOUND AND PLACEMENT OF  INTEGRA  AND  VAC;  Surgeon: Theodoro Kos, DO;  Location: [place];  Service: Plastics;  Laterality: Left;  . INCISION AND DRAINAGE OF WOUND Left 04/23/2014   Procedure: IRRIGATION AND DEBRIDEMENT LOWER LEFT LEG WOUND WITH PLACEMENT OF INTEGRA AND VAC;  Surgeon: Theodoro Kos, DO;  Location: [place];  Service: Plastics;  Laterality: Left;  . kidney stone removal  2004  . MINOR APPLICATION OF WOUND VAC Left 04/23/2014   Procedure: MINOR APPLICATION OF WOUND VAC;  Surgeon: Theodoro Kos, DO;  Location: [place];  Service: Plastics;  Laterality: Left;  . SKIN SPLIT GRAFT Left 06/18/2014   Procedure: SKIN GRAFT SPLIT THICKNESS TO LOWER LEFT LEG WOUND WITH PLACEMENT OF VAC;  Surgeon: Theodoro Kos, DO;  Location: [place];  Service: Plastics;  Laterality: Left;  Marland Kitchen VASECTOMY  1994    reports that he  has been smoking Cigarettes.  He has a 18.75 pack-year smoking history. He has never used smokeless tobacco. He reports that he drinks alcohol. He reports that he does not use drugs. family history includes Cancer in his mother; Diabetes in his father and mother; Heart disease in his father; Hyperlipidemia in his father; Hypertension in his father; Leukemia in his mother; Pneumonia in his mother; Stroke in his mother. Allergies  Allergen Reactions  . Betadine [Povidone Iodine]     Raised rash 04/23/14  . Latex Dermatitis  . Penicillins     REACTION: unspecified  . Lidocaine Other (See Comments)    Minimal effect with lidocaine, prefers different anesthetic      Outpatient Encounter Prescriptions as of 07/01/2016  Medication Sig  . Blood Glucose Monitoring Suppl (ONETOUCH VERIO FLEX SYSTEM) w/Device KIT 1 each by Does not apply route 2 (two) times daily. Dx: E11.622/L97.929  . glipiZIDE (GLUCOTROL XL) 10 MG 24 hr tablet Take 2 tablets (20 mg total) by mouth daily with breakfast.  . glucose blood (ONETOUCH VERIO) test strip Use to check blood sugar two times a day.  Dx: E11.622/L97.929  . lisinopril-hydrochlorothiazide (PRINZIDE,ZESTORETIC) 20-12.5 MG tablet TAKE 2 TABLETS DAILY (Patient taking differently: TAKE 2 TABLETS BY MOUTH EACH MORNING)  . meloxicam (MOBIC) 15 MG tablet TAKE 1 TABLET DAILY (Patient taking differently: TAKE 15 MG BY MOUTH EACH MORNING)  . metFORMIN (GLUCOPHAGE-XR) 500 MG 24 hr tablet TAKE 4  TABLETS DAILY WITH  BREAKFAST (Patient taking differently: TAKE 1000 MG EVERY MORNING AND 1000 MG EVERYT NIGHT)  . Multiple Vitamin (MULTIVITAMIN) tablet Take 1 tablet by mouth daily.    . NONFORMULARY OR COMPOUNDED ITEM Epic Account: [id number] FLP, E78.5 A1c, E11.9 Urine microalbumin, E11.9 Cbc with diff, HFP, BMET: Z79.899 PSA: Z12.5  . NONFORMULARY OR COMPOUNDED ITEM Epic Account: [id number] Lab Studies: FLP, E78.5 A1c, E11.9 Urine microalbumin, E11.9 BMET: Z79.899 PSA:  Z12.5 HIV: Z11.4 Hepatitis C: Z11.59  . ONETOUCH DELICA LANCETS 00T MISC 1 each by Does not apply route 2 (two) times daily. E11.622/L97.929  . oxyCODONE-acetaminophen (PERCOCET) 10-325 MG tablet Take 1 tablet by mouth every 4 (four) hours as needed. (Patient taking differently: Take 1 tablet by mouth every 4 (four) hours as needed for pain. )  . pantoprazole (PROTONIX) 40 MG tablet TAKE 1 TABLET DAILY (Patient taking differently: TAKE 40 MG BY MOUTH EVERY MORNING)  . PARoxetine (PAXIL) 30 MG tablet TAKE 1 BY MOUTH EVERY MORNING  . pentoxifylline (TRENTAL) 400 MG CR tablet TAKE 1 TABLET 3 TIMES DAILYWITH MEALS (Patient taking differently: TAKE 800 MG BY MOUTH EVERY MORNING AND 400 MG EVERY NIGHT)  . pioglitazone (ACTOS) 30 MG tablet Take 1 tablet (30 mg total) by mouth daily. (Patient taking differently: Take 30 mg by mouth every morning. )  . rOPINIRole (REQUIP) 0.5 MG tablet TAKE 1 TABLET AT BEDTIME (Patient taking differently: TAKE 0.5 MG BY MOUTH AT BEDTIME)  . sildenafil (REVATIO) 20 MG tablet Generic Revatio / Sildanefil 20 mg. 2 - 5 tabs 30 mins prior to intercourse.   Make sure he understands this is cash / credit card, not to be run through insurance.  . simvastatin (ZOCOR) 40 MG tablet Take 1 tablet by mouth at  bedtime (Patient taking differently: Take 40 mg by mouth at bedtime. )  . vitamin C (ASCORBIC ACID) 500 MG tablet Take 500 mg by mouth daily.  . [DISCONTINUED] predniSONE (DELTASONE) 20 MG tablet 2 tabs po daily x 4 days   No facility-administered encounter medications on file as of 07/01/2016.      REVIEW OF SYSTEMS  : All other systems reviewed and negative except where noted in the History of Present Illness.   PHYSICAL EXAM: BP 112/64   Pulse 84   Ht '5\' 11"'$  (1.803 m)   Wt (!) 355 lb (161 kg)   BMI 49.51 kg/m  General: Well developed white male in no acute distress Head: Normocephalic and atraumatic Eyes:  Sclerae anicteric, conjunctiva pink. Ears: Normal auditory  acuity Lungs: Clear throughout to auscultation Heart: Regular rate and rhythm Abdomen: Soft, non-distended.  Normal bowel sounds.  Non-tender. Rectal:  Will be done at the time of colonoscopy. Musculoskeletal: Symmetrical with no gross deformities  Skin: No lesions on visible extremities Extremities: No edema  Neurological: Alert oriented x 4, grossly non-focal Psychological:  Alert and cooperative. Normal mood and affect  ASSESSMENT AND PLAN: -Screening colonoscopy:  Will schedule with Dr. Loletha Carrow at [facility] hospital due to weight.  The risks, benefits, and alternatives to colonoscopy were discussed with the patient and he consents to proceed.   CC:  Cop[place], Frederico Hamman, MD   Thank you for sending this case to me. I have reviewed the entire note, and the outlined plan seems appropriate. Next routine hospital endo day.  Wilfrid Lund, MD

## 2016-07-01 NOTE — Patient Instructions (Signed)
We will contact you to schedule your colonoscopy at the hospital

## 2016-07-08 ENCOUNTER — Other Ambulatory Visit: Payer: Self-pay

## 2016-07-08 ENCOUNTER — Encounter: Payer: 59 | Admitting: Gastroenterology

## 2016-07-08 ENCOUNTER — Telehealth: Payer: Self-pay

## 2016-07-08 DIAGNOSIS — Z1211 Encounter for screening for malignant neoplasm of colon: Secondary | ICD-10-CM

## 2016-07-08 NOTE — Telephone Encounter (Signed)
Left a message for pt to return call. Pt needs a colonoscopy at [facility] end[facility]. Due to current weight of 355lbs.

## 2016-07-11 ENCOUNTER — Other Ambulatory Visit: Payer: Self-pay

## 2016-07-11 MED ORDER — NA SULFATE-K SULFATE-MG SULF 17.5-3.13-1.6 GM/177ML PO SOLN: 1.0000 | Freq: Once | ORAL | 0 refills | Status: AC

## 2016-07-11 NOTE — Telephone Encounter (Signed)
[facility]2016 pt has been notified and aware. Instructions and paper work mailed to pt today.

## 2016-07-20 ENCOUNTER — Other Ambulatory Visit: Payer: Self-pay | Admitting: Family Medicine

## 2016-07-23 LAB — BASIC METABOLIC PANEL

## 2016-07-23 LAB — HEPATITIS C ANTIBODY: Hep C Virus Ab: <0.1 s/co ratio (ref 0.0–0.9)

## 2016-07-23 LAB — PSA: PROSTATE SPECIFIC AG, SERUM: 0.4 ng/mL (ref 0.0–4.0)

## 2016-07-23 LAB — HIV ANTIBODY (ROUTINE TESTING W REFLEX): HIV SCREEN 4TH GENERATION: NONREACTIVE

## 2016-07-23 LAB — HGB A1C W/O EAG: Hgb A1c MFr Bld: 7.8 % — ABNORMAL HIGH (ref 4.8–5.6)

## 2016-07-23 LAB — MICROALBUMIN, URINE: Microalbumin, Urine: 54 ug/mL

## 2016-07-26 ENCOUNTER — Telehealth: Payer: Self-pay | Admitting: Radiology

## 2016-07-26 NOTE — Telephone Encounter (Signed)
[facility] called to inform us that the BMP was not done to instrument problems. New sample needs to be sent.

## 2016-07-26 NOTE — Telephone Encounter (Signed)
i'll discuss with him in office

## 2016-07-27 ENCOUNTER — Encounter: Payer: Self-pay | Admitting: Family Medicine

## 2016-07-27 ENCOUNTER — Ambulatory Visit (INDEPENDENT_AMBULATORY_CARE_PROVIDER_SITE_OTHER): Payer: 59 | Admitting: Family Medicine

## 2016-07-27 VITALS — BP 120/68 | HR 85 | Temp 98.2°F | Ht 71.0 in | Wt 361.5 lb

## 2016-07-27 DIAGNOSIS — E11622 Type 2 diabetes mellitus with other skin ulcer: Secondary | ICD-10-CM

## 2016-07-27 DIAGNOSIS — G2581 Restless legs syndrome: Secondary | ICD-10-CM | POA: Diagnosis not present

## 2016-07-27 DIAGNOSIS — I10 Essential (primary) hypertension: Secondary | ICD-10-CM

## 2016-07-27 DIAGNOSIS — L97929 Non-pressure chronic ulcer of unspecified part of left lower leg with unspecified severity: Secondary | ICD-10-CM

## 2016-07-27 DIAGNOSIS — T782XXD Anaphylactic shock, unspecified, subsequent encounter: Secondary | ICD-10-CM | POA: Diagnosis not present

## 2016-07-27 MED ORDER — ROPINIROLE HCL 0.5 MG PO TABS: 0.5000 mg | ORAL_TABLET | Freq: Two times a day (BID) | ORAL | 1 refills | Status: DC

## 2016-07-27 MED ORDER — PIOGLITAZONE HCL 45 MG PO TABS: 45.0000 mg | ORAL_TABLET | Freq: Every day | ORAL | 1 refills | Status: DC

## 2016-07-27 NOTE — Patient Instructions (Signed)

## 2016-07-27 NOTE — Progress Notes (Signed)
Pre visit review using our clinic review tool, if applicable. No additional management support is needed unless otherwise documented below in the visit note. 

## 2016-07-27 NOTE — Progress Notes (Signed)
Dr. Frederico Hamman T. Sarita Hakanson, MD, [facility] [place], [address] Phone: [phone] Fax: [phone]  07/27/2016  Patient: [name] [name], MRN: [id number], DOB: November 21, 1960, 56 y.o.  Primary Physician:  Owens Loffler, MD   Chief Complaint  Patient presents with  . Diabetes   Subjective:   [name] [name] is a 56 y.o. very pleasant male patient who presents with the following:  Wt Readings from Last 3 Encounters:  07/27/16 (!) 361 lb 8 oz (164 kg)  07/01/16 (!) 355 lb (161 kg)  04/20/16 (!) 357 lb (161.9 kg)    Anaphylaxis. The patient was eating cashews a little bit over a month ago, and ultimately was taken to the emergency room requiring epinephrine to recover from anaphylactic shock.  Diabetes Mellitus: Tolerating Medications: yes Compliance with diet: fair Exercise: minimal / intermittent Avg blood sugars at home: not checking Foot problems: none Hypoglycemia: none No nausea, vomitting, blurred vision, polyuria.  Lab Results  Component Value Date   HGBA1C 7.8 (H) 07/20/2016   HGBA1C 9.0 (H) 04/08/2016   HGBA1C 9.0 (H) 06/19/2015   Body mass index is 50.42 kg/m.   HTN: Tolerating all medications without side effects Stable and at goal No CP, no sob. No HA.  BP Readings from Last 3 Encounters:  07/27/16 120/68  07/01/16 112/64  06/06/16 120/75   Restless leg symptoms of been increasing in his legs. He is currently taking Requip at night only.   Past Medical History, Surgical History, Social History, Family History, Problem List, Medications, and Allergies have been reviewed and updated if relevant.  Patient Active Problem List   Diagnosis Date Noted  . Diabetic leg ulcer ([place]) 07/18/2013    Priority: High  . 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 Morbid obesity with BMI of 50.0-59.9, adult ([facility] [place]) 09/29/2006    Priority: High  . Hyperlipidemia  LDL goal <70 01/16/2009    Priority: Medium  . TOBACCO ABUSE 07/27/2007    Priority: Medium  . Essential hypertension 09/29/2006    Priority: Medium  . Colon cancer screening 07/01/2016  . BPH (benign prostatic hyperplasia) 12/24/2014  . IBS (irritable bowel syndrome) 12/24/2014  . 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 Restless legs syndrome (RLS) 11/20/2012  . Generalized anxiety disorder   . CALCULUS, KIDNEY 05/30/2002    Past Medical History:  Diagnosis Date  . Anxiety   . Depression   . Diabetes mellitus type II   . GERD (gastroesophageal reflux disease)   . HLD (hyperlipidemia)   . HTN (hypertension)   . Morbid obesity ([place])   . Restless legs syndrome (RLS) 11/20/2012  . Sleep apnea    no tested  . Snores   . Tobacco abuse   . Wears glasses     Past Surgical History:  Procedure Laterality Date  . Arm surgery Right 1969   Abscess excision and debridement at elbow  . I&D EXTREMITY Left 05/29/2014   Procedure: IRRIGATION AND DEBRIDEMENT OF LEFT LEG WOUND AND PLACEMENT OF  INTEGRA  AND  VAC;  Surgeon: Theodoro Kos, DO;  Location: [facility];  Service: Plastics;  Laterality: Left;  . INCISION AND DRAINAGE OF WOUND Left 04/23/2014   Procedure: IRRIGATION AND DEBRIDEMENT LOWER LEFT LEG WOUND WITH PLACEMENT OF INTEGRA AND VAC;  Surgeon: Theodoro Kos, DO;  Location: [facility];  Service: Clinical cytogeneticist;  Laterality: Left;  . kidney stone removal  2004  . MINOR APPLICATION OF WOUND VAC Left 04/23/2014   Procedure: MINOR APPLICATION OF WOUND VAC;  Surgeon: Theodoro Kos, DO;  Location: [facility];  Service: Plastics;  Laterality: Left;  . SKIN SPLIT GRAFT Left 06/18/2014   Procedure: SKIN GRAFT SPLIT THICKNESS TO LOWER LEFT LEG WOUND WITH PLACEMENT OF VAC;  Surgeon: Theodoro Kos, DO;  Location: [facility];  Service: Plastics;  Laterality: Left;  Marland Kitchen VASECTOMY  1994    Social History    Social History  . Marital status: Married    Spouse name: N/A  . Number of children: 3  . Years of education: N/A   Occupational History  . [employer]ing and receivables    Social History Main Topics  . Smoking status: Current Every Day Smoker    Packs/day: 0.25    Years: 75.00    Types: Cigarettes  . Smokeless tobacco: Never Used     Comment: one cigarette daily  . Alcohol use 0.0 oz/week     Comment: rarely  . Drug use: No  . Sexual activity: Yes    Partners: Female   Other Topics Concern  . Not on file   Social History Narrative  . No narrative on file    Family History  Problem Relation Age of Onset  . Stroke Mother     multiple  . Leukemia Mother   . Pneumonia Mother   . Cancer Mother   . Diabetes Mother   . Diabetes Father   . Heart disease Father   . Hyperlipidemia Father   . Hypertension Father     Allergies  Allergen Reactions  . Betadine [Povidone Iodine]     Raised rash 04/23/14  . Latex Dermatitis  . Penicillins     REACTION: unspecified  . Lidocaine Other (See Comments)    Minimal effect with lidocaine, prefers different anesthetic    Medication list reviewed and updated in full in [facility].   GEN: No acute illnesses, no fevers, chills. GI: No n/v/d, eating normally Pulm: No SOB Interactive and getting along well at home.  Otherwise, ROS is as per the HPI.  Objective:   BP 120/68   Pulse 85   Temp 98.2 F (36.8 C) (Oral)   Ht _0  (1.803 m)   Wt (!) 361 lb 8 oz (164 kg)   BMI 50.42 kg/m   GEN: WDWN, NAD, Non-toxic, A & O x 3 HEENT: Atraumatic, Normocephalic. Neck supple. No masses, No LAD. Ears and Nose: No external deformity. CV: RRR, No M/G/R. No JVD. No thrill. No extra heart sounds. PULM: CTA B, no wheezes, crackles, rhonchi. No retractions. No resp. distress. No accessory muscle use. EXTR: No c/c/e NEURO Normal gait.  PSYCH: Normally interactive. Conversant. Not depressed or anxious appearing.   Calm demeanor.   Laboratory and Imaging Data: Results for orders placed or performed in visit on 67/12/45  Basic metabolic panel  Result Value Ref Range   Glucose CANCELED mg/dL   BUN CANCELED    Creatinine, Ser CANCELED    Sodium CANCELED    Potassium CANCELED    Chloride CANCELED    CO2 CANCELED    Calcium CANCELED   Hgb A1c w/o eAG  Result Value Ref Range   Hgb A1c MFr Bld 7.8 (H) 4.8 - 5.6 %  PSA  Result Value Ref Range   Prostate Specific Ag, Serum 0.4 0.0 - 4.0  ng/mL  Hepatitis C antibody  Result Value Ref Range   Hep C Virus Ab <0.1 0.0 - 0.9 s/co ratio  Microalbumin, urine  Result Value Ref Range   Albumin, Urine 54.0 Not Estab. 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 Increase Actos to 45 milligrams. Re: maxed out on metformin as well as glipizide.  4 restless legs, increase Requip dosing to twice a day.  Anaphylactic shock with recovery by epinephrine. 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 Given life-threatening situation, recommend formal allergy evaluation.  Follow-up: Return in about 6 months (around 01/24/2017).  Meds ordered this encounter  Medications  . EPINEPHrine 0.3 mg/0.3 mL IJ SOAJ injection  . rOPINIRole (REQUIP) 0.5 MG tablet    Sig: Take 1 tablet (0.5 mg total) by mouth 2 (two) times daily.    Dispense:  180 tablet    Refill:  1  . pioglitazone (ACTOS) 45 MG tablet    Sig: Take 1 tablet (45 mg total) by mouth daily.    Dispense:  90 tablet    Refill:  1   Medications Discontinued During This Encounter  Medication Reason  . NONFORMULARY OR COMPOUNDED ITEM   . NONFORMULARY OR COMPOUNDED ITEM   . rOPINIRole (REQUIP) 0.5 MG tablet Reorder  .  pioglitazone (ACTOS) 30 MG tablet Reorder   Orders Placed This Encounter  Procedures  . Ambulatory referral to Allergy    Signed,  Marsel Gail T. Shaguana Love, MD   Allergies as of 07/27/2016      Reactions   Betadine [povidone Iodine]    Raised rash 04/23/14   Latex Dermatitis   Penicillins    REACTION: unspecified   Lidocaine Other (See Comments)   Minimal effect with lidocaine, prefers different anesthetic      Medication List       Accurate as of 07/27/16  5:06 PM. Always use your most recent med list.          EPINEPHrine 0.3 mg/0.3 mL Soaj injection Commonly known as:  EPI-PEN   glipiZIDE 10 MG 24 hr tablet Commonly known as:  GLUCOTROL XL Take 2 tablets (20 mg total) by mouth daily with breakfast.   glucose blood test strip Commonly known as:  ONETOUCH VERIO Use to check blood sugar two times a day.  Dx: E11.622/L97.929   lisinopril-hydrochlorothiazide 20-12.5 MG tablet Commonly known as:  PRINZIDE,ZESTORETIC TAKE 2 TABLETS DAILY   meloxicam 15 MG tablet Commonly known as:  MOBIC TAKE 1 TABLET DAILY   metFORMIN 500 MG 24 hr tablet Commonly known as:  GLUCOPHAGE-XR TAKE 4 TABLETS DAILY WITH  BREAKFAST   multivitamin tablet Take 1 tablet by mouth daily.   ONETOUCH DELICA LANCETS 43P Misc 1 each by Does not apply route 2 (two) times daily. E11.622/L97.929   [place] w/Device Kit 1 each by Does not apply route 2 (two) times daily. Dx: E11.622/L97.929   oxyCODONE-acetaminophen 10-325 MG tablet Commonly known as:  PERCOCET Take 1 tablet by mouth every 4 (four) hours as needed.   pantoprazole 40 MG tablet Commonly known as:  PROTONIX TAKE 1 TABLET DAILY   PARoxetine 30 MG tablet Commonly known as:  PAXIL TAKE 1 BY MOUTH EVERY  MORNING   pentoxifylline 400 MG CR tablet Commonly known as:  TRENTAL TAKE 1 TABLET 3 TIMES DAILYWITH MEALS   pioglitazone 45 MG tablet Commonly known as:  ACTOS Take 1 tablet (45 mg total) by mouth daily.    rOPINIRole 0.5 MG tablet Commonly known as:  REQUIP Take 1 tablet (0.5 mg total) by mouth 2 (two) times daily.   sildenafil 20 MG tablet Commonly known as:  REVATIO Generic Revatio / Sildanefil 20 mg. 2 - 5 tabs 30 mins prior to intercourse.   Make sure he understands this is cash / credit card, not to be run through insurance.   simvastatin 40 MG tablet Commonly known as:  ZOCOR Take 1 tablet by mouth at  bedtime   vitamin C 500 MG tablet Commonly known as:  ASCORBIC ACID Take 500 mg by mouth daily.

## 2016-08-17 ENCOUNTER — Other Ambulatory Visit: Payer: Self-pay | Admitting: Family Medicine

## 2016-08-18 NOTE — Telephone Encounter (Signed)
Last office visit 07/27/2016.  Last refilled 06/05/2016 for #90 with no refills.  Ok to refill?

## 2016-09-02 ENCOUNTER — Encounter (HOSPITAL_COMMUNITY): Payer: Self-pay | Admitting: *Deleted

## 2016-09-02 ENCOUNTER — Ambulatory Visit (INDEPENDENT_AMBULATORY_CARE_PROVIDER_SITE_OTHER): Payer: 59 | Admitting: Allergy

## 2016-09-02 ENCOUNTER — Encounter: Payer: Self-pay | Admitting: Allergy

## 2016-09-02 VITALS — BP 140/72 | HR 94 | Temp 97.9°F | Resp 16 | Ht 70.0 in | Wt 367.0 lb

## 2016-09-02 DIAGNOSIS — T7840XD Allergy, unspecified, subsequent encounter: Secondary | ICD-10-CM | POA: Diagnosis not present

## 2016-09-02 NOTE — Progress Notes (Signed)
New Patient Note  RE: [name] [name] MRN: [id number] DOB: 1960-12-19 Date of Office Visit: 09/02/2016  Referring provider: Owens Loffler, MD Primary care provider: Owens Loffler, MD  Chief Complaint: allergic reaction  History of present illness: [name] [name] is a 56 y.o. male presenting today for consultation for allergic reaction.   He was at work in early January 2018 eating cashews as he reports he eats fruits and vegetables every couple hours for his diabetic diet.  He was eating cashews prior to lunch.   He states he started to feel bad and did not eat any lunch. About an hour after eating the cashews his hands started to feel itchy and were read. He then noticed that his eyes feel itchy. He looked in the mirror and noticed that he was having facial swelling. He then noted shallow breathing and developed hives. His coworkers called EMS and he was taken to the emergency department. He reports all at the emergency department he started to feel better but states that his blood pressure dropped and he was given epinephrine. He immediately felt better after epinephrine administration. ED note from 06/06/2016 showed on exam he appeared in distress. He had respiratory distress and diffuse wheezes. A rash was noted with hives on arms bilaterally and his mood was anxious. ED note reports that he was to And in distress on exam. He had progressive hypotension with a manual BP reading of 90/50. He was not hypoxic or tachycardic. He was given a DuoNeb, Benadryl, Solu-Medrol and epinephrine.  He was monitored and was able to be discharged from the emergency department.    He has stayed away from all nuts since this episode.  He reports prior to initial reaction he was eating nuts including cashews without any issue.   He went out to eat wit his wife 3 weeks ago and ate an appetizer that came with a sauce.  He ate the sauce and he didn't realize the sauce contained cashews.  He started to  develop rash on his face.  He immediately took benadryl and has resolution of his facial rash.     He has an Epipen.     No history of food allergy in the past.    He has some past history of itchy watery eyes and nasal congestion but no significant yearly symptoms at this time. No history of asthma or eczema.      Review of systems: Review of Systems  Constitutional: Negative for chills, fever and malaise/fatigue.  HENT: Negative for congestion, ear discharge, ear pain, nosebleeds, sinus pain, sore throat and tinnitus.   Eyes: Negative for discharge and redness.  Respiratory: Negative for cough, shortness of breath and wheezing.   Cardiovascular: Negative for chest pain.  Gastrointestinal: Negative for abdominal pain, heartburn, nausea and vomiting.  Musculoskeletal: Negative for joint pain and myalgias.  Skin: Positive for itching and rash.  Neurological: Negative for headaches.    All other systems negative unless noted above in HPI  Past medical history: Past Medical History:  Diagnosis Date  . Angio-edema   . Anxiety   . Depression   . Diabetes mellitus type II    type 2  . GERD (gastroesophageal reflux disease)   . Headache    migraine  . History of kidney stones   . HLD (hyperlipidemia)   . HTN (hypertension)   . Morbid obesity ([facility] [place])   . Restless legs syndrome (RLS) 11/20/2012  . Snores   . Tobacco abuse   .  Ulcer of left ankle (HCC)    last 2 months dime size dry dressing changing q day  . Urticaria   . Wears glasses     Past surgical history: Past Surgical History:  Procedure Laterality Date  . Arm surgery Right 1969   Abscess excision and debridement at elbow  . I&D EXTREMITY Left 05/29/2014   Procedure: IRRIGATION AND DEBRIDEMENT OF LEFT LEG WOUND AND PLACEMENT OF  INTEGRA  AND  VAC;  Surgeon: Theodoro Kos, DO;  Location: [facility];  Service: Plastics;  Laterality: Left;  . INCISION AND DRAINAGE OF WOUND Left 04/23/2014   Procedure:  IRRIGATION AND DEBRIDEMENT LOWER LEFT LEG WOUND WITH PLACEMENT OF INTEGRA AND VAC;  Surgeon: Theodoro Kos, DO;  Location: [facility];  Service: Plastics;  Laterality: Left;  . kidney stone removal  2004  . MINOR APPLICATION OF WOUND VAC Left 04/23/2014   Procedure: MINOR APPLICATION OF WOUND VAC;  Surgeon: Theodoro Kos, DO;  Location: [facility];  Service: Plastics;  Laterality: Left;  . SKIN SPLIT GRAFT Left 06/18/2014   Procedure: SKIN GRAFT SPLIT THICKNESS TO LOWER LEFT LEG WOUND WITH PLACEMENT OF VAC;  Surgeon: Theodoro Kos, DO;  Location: [facility];  Service: Plastics;  Laterality: Left;  Marland Kitchen VASECTOMY  1994    Family history:  Family History  Problem Relation Age of Onset  . Stroke Mother     multiple  . Leukemia Mother   . Pneumonia Mother   . Cancer Mother   . Diabetes Mother   . Diabetes Father   . Heart disease Father   . Hyperlipidemia Father   . Hypertension Father   . Allergic rhinitis Neg Hx   . Angioedema Neg Hx   . Asthma Neg Hx   . Eczema Neg Hx   . Immunodeficiency Neg Hx   . Urticaria Neg Hx     Social history: He lives in a house without carpeting with gas heating and central cooling. There are 2 dogs in the home. There is no concern for water damage, mildew or broaches in the home. 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 He does smoke less than half a pack per day of regular and use cigarettes for the past 40 years.  He works at [employer].    Medication List: Allergies as of 09/02/2016      Reactions   Cashew Nut Oil Anaphylaxis   Cashews, 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   Betadine [povidone Iodine]    Raised rash 04/23/14   Latex Dermatitis   Penicillins    REACTION: rash all over as child   Lidocaine Other (See Comments)   Minimal effect with lidocaine, prefers different anesthetic      Medication List       Accurate as of 09/02/16  4:40 PM. Always use your most recent med list.            EPINEPHrine 0.3 mg/0.3 mL Soaj injection Commonly known as:  EPI-PEN   glipiZIDE 10 MG 24 hr tablet Commonly known as:  GLUCOTROL XL Take 2 tablets (20 mg total) by mouth daily with breakfast.   glucose blood test strip Commonly known as:  ONETOUCH VERIO Use to check blood sugar two times a day.  Dx: E11.622/L97.929   lisinopril-hydrochlorothiazide 20-12.5 MG tablet Commonly known as:  PRINZIDE,ZESTORETIC TAKE 2 TABLETS DAILY   meloxicam 15 MG tablet Commonly known as:  MOBIC TAKE 1 TABLET DAILY   metFORMIN 500 MG 24  hr tablet Commonly known as:  GLUCOPHAGE-XR TAKE 4 TABLETS DAILY WITH  BREAKFAST   multivitamin tablet Take 1 tablet by mouth daily.   ONETOUCH DELICA LANCETS 34H Misc 1 each by Does not apply route 2 (two) times daily. E11.622/L97.929   [place] w/Device Kit 1 each by Does not apply route 2 (two) times daily. Dx: E11.622/L97.929   oxyCODONE-acetaminophen 10-325 MG tablet Commonly known as:  PERCOCET Take 1 tablet by mouth every 4 (four) hours as needed.   pantoprazole 40 MG tablet Commonly known as:  PROTONIX TAKE 1 TABLET DAILY   PARoxetine 30 MG tablet Commonly known as:  PAXIL TAKE 1 TABLET EVERY MORNING   pentoxifylline 400 MG CR tablet Commonly known as:  TRENTAL TAKE 1 TABLET 3 TIMES DAILYWITH MEALS   pioglitazone 45 MG tablet Commonly known as:  ACTOS Take 1 tablet (45 mg total) by mouth daily.   rOPINIRole 0.5 MG tablet Commonly known as:  REQUIP Take 1 tablet (0.5 mg total) by mouth 2 (two) times daily.   sildenafil 20 MG tablet Commonly known as:  REVATIO Generic Revatio / Sildanefil 20 mg. 2 - 5 tabs 30 mins prior to intercourse.   Make sure he understands this is cash / credit card, not to be run through insurance.   simvastatin 40 MG tablet Commonly known as:  ZOCOR Take 1 tablet by mouth at  bedtime   vitamin C 500 MG tablet Commonly known as:  ASCORBIC ACID Take 500 mg by mouth daily.       Known  medication allergies: Allergies  Allergen Reactions  . Cashew Nut Oil Anaphylaxis    Cashews, 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  Marland Kitchen Betadine [Povidone Iodine]     Raised rash 04/23/14  . Latex Dermatitis  . Penicillins     REACTION: rash all over as child  . Lidocaine Other (See Comments)    Minimal effect with lidocaine, prefers different anesthetic     Physical examination: Blood pressure 140/72, pulse 94, temperature 97.9 F (36.6 C), temperature source Oral, resp. rate 16, height '5\' 10"'$  (1.778 m), weight (!) 367 lb (166.5 kg), SpO2 96 %.  General: Alert, interactive, in no acute distress. obese HEENT: TMs pearly gray, turbinates minimally edematous without discharge, post-pharynx non erythematous. Neck: Supple without lymphadenopathy. Lungs: Clear to auscultation without wheezing, rhonchi or rales. {no increased work of breathing. CV: Normal S1, S2 without murmurs. Abdomen: Nondistended, nontender. Skin: Warm and dry, without lesions or rashes. Extremities:  No clubbing, cyanosis or edema. Neuro:   Grossly intact.  Diagnositics/Labs:  Allergy testing: Deferred due to reaction occurring within 4 weeks  Assessment and plan:   Allergic reaction      - patient had anaphylaxis following cashew ingestion.  He had a subsequent mild reaction to food containing cashew.  It appears that cashew has been the trigger to these reactions.     - at this time continue avoidance of all nuts     - have access to Epipen 0.'3mg'$  for use in case of allergic reaction      - follow emergency action plan in case of allergic reaction      - we will obtain serum IgE levels for nut panel and tryptase level. Will call with results.  If labs are negative then will recommend skin prick testing no sooner than 4-6 weeks from last reaction.    Follow-up 1 year or sooner if needed  I appreciate the opportunity to take part in Ezekeil's care. Please  do not hesitate to contact me with  questions.  Sincerely,   Prudy Feeler, MD Allergy/Immunology [facility]

## 2016-09-02 NOTE — Patient Instructions (Signed)
Allergic reaction      - both reactions were following cashew ingestion which appears to be the trigger.       - at this time continue avoidance of all nuts     - have access to Epipen 0.3mg  for use in case of allergic reaction      - follow emergency action plan in case of allergic reaction      - we will obtain serum IgE levels for nut panel and tryptase level. Will call with results.  If labs are negative then will recommend skin prick testing no sooner than 4-6 weeks from last reaction.    Follow-up 1 year or sooner if needed

## 2016-09-02 NOTE — Progress Notes (Signed)
   09/02/16 1514  OBSTRUCTIVE SLEEP APNEA  Have you ever been diagnosed with sleep apnea through a sleep study? No  Do you snore loudly (loud enough to be heard through closed doors)?  1  Do you often feel tired, fatigued, or sleepy during the daytime (such as falling asleep during driving or talking to someone)? 1  Has anyone observed you stop breathing during your sleep? 1  Do you have, or are you being treated for high blood pressure? 1  BMI more than 35 kg/m2? 1  Age > 50 (1-yes) 1  Neck circumference greater than:Male 16 inches or larger, Male 17inches or larger? 0  Male Gender (Yes=1) 1  Obstructive Sleep Apnea Score 7

## 2016-09-05 ENCOUNTER — Other Ambulatory Visit: Payer: Self-pay

## 2016-09-05 DIAGNOSIS — Z1211 Encounter for screening for malignant neoplasm of colon: Secondary | ICD-10-CM

## 2016-09-05 DIAGNOSIS — K589 Irritable bowel syndrome without diarrhea: Secondary | ICD-10-CM

## 2016-09-06 ENCOUNTER — Ambulatory Visit (HOSPITAL_COMMUNITY): Payer: 59 | Admitting: Anesthesiology

## 2016-09-06 ENCOUNTER — Encounter (HOSPITAL_COMMUNITY)
Admission: RE | Disposition: A | Discharge status: Home / Self Care | Payer: Self-pay | Source: Ambulatory Visit | Attending: Gastroenterology

## 2016-09-06 ENCOUNTER — Encounter (HOSPITAL_COMMUNITY): Payer: Self-pay

## 2016-09-06 ENCOUNTER — Ambulatory Visit (HOSPITAL_COMMUNITY)
Admission: RE | Admit: 2016-09-06 | Discharge: 2016-09-06 | Disposition: A | Discharge status: Home / Self Care | Payer: 59 | Source: Ambulatory Visit | Attending: Gastroenterology | Admitting: Gastroenterology

## 2016-09-06 DIAGNOSIS — K5730 Diverticulosis of large intestine without perforation or abscess without bleeding: Secondary | ICD-10-CM | POA: Diagnosis not present

## 2016-09-06 DIAGNOSIS — E119 Type 2 diabetes mellitus without complications: Secondary | ICD-10-CM | POA: Insufficient documentation

## 2016-09-06 DIAGNOSIS — E785 Hyperlipidemia, unspecified: Secondary | ICD-10-CM | POA: Diagnosis not present

## 2016-09-06 DIAGNOSIS — Z1211 Encounter for screening for malignant neoplasm of colon: Secondary | ICD-10-CM | POA: Diagnosis present

## 2016-09-06 DIAGNOSIS — F419 Anxiety disorder, unspecified: Secondary | ICD-10-CM | POA: Insufficient documentation

## 2016-09-06 DIAGNOSIS — K219 Gastro-esophageal reflux disease without esophagitis: Secondary | ICD-10-CM | POA: Insufficient documentation

## 2016-09-06 DIAGNOSIS — F17200 Nicotine dependence, unspecified, uncomplicated: Secondary | ICD-10-CM | POA: Diagnosis not present

## 2016-09-06 DIAGNOSIS — Z880 Allergy status to penicillin: Secondary | ICD-10-CM | POA: Diagnosis not present

## 2016-09-06 DIAGNOSIS — I10 Essential (primary) hypertension: Secondary | ICD-10-CM | POA: Diagnosis not present

## 2016-09-06 DIAGNOSIS — F329 Major depressive disorder, single episode, unspecified: Secondary | ICD-10-CM | POA: Diagnosis not present

## 2016-09-06 DIAGNOSIS — Z1212 Encounter for screening for malignant neoplasm of rectum: Secondary | ICD-10-CM | POA: Diagnosis not present

## 2016-09-06 DIAGNOSIS — R51 Headache: Secondary | ICD-10-CM | POA: Diagnosis not present

## 2016-09-06 DIAGNOSIS — G2581 Restless legs syndrome: Secondary | ICD-10-CM | POA: Diagnosis not present

## 2016-09-06 HISTORY — DX: Non-pressure chronic ulcer of left ankle with unspecified severity: L97.329

## 2016-09-06 HISTORY — PX: COLONOSCOPY WITH PROPOFOL: SHX5780

## 2016-09-06 HISTORY — DX: Personal history of urinary calculi: Z87.442

## 2016-09-06 HISTORY — DX: Headache, unspecified: R51.9

## 2016-09-06 HISTORY — DX: Headache: R51

## 2016-09-06 LAB — GLUCOSE, CAPILLARY: Glucose-Capillary: 199 mg/dL — ABNORMAL HIGH (ref 65–99)

## 2016-09-06 SURGERY — COLONOSCOPY WITH PROPOFOL
Anesthesia: Monitor Anesthesia Care

## 2016-09-06 MED ORDER — SODIUM CHLORIDE 0.9 % IV SOLN: INTRAVENOUS | Status: DC

## 2016-09-06 MED ORDER — PROPOFOL 10 MG/ML IV BOLUS
INTRAVENOUS | Status: DC | PRN
  Administered 2016-09-06: 20 mg via INTRAVENOUS
  Administered 2016-09-06: 30 mg via INTRAVENOUS

## 2016-09-06 MED ORDER — PROPOFOL 10 MG/ML IV BOLUS
INTRAVENOUS | Status: AC
  Filled 2016-09-06: qty 20

## 2016-09-06 MED ORDER — LIDOCAINE 2% (20 MG/ML) 5 ML SYRINGE
INTRAMUSCULAR | Status: DC | PRN
  Administered 2016-09-06: 50 mg via INTRAVENOUS

## 2016-09-06 MED ORDER — PROPOFOL 10 MG/ML IV BOLUS
INTRAVENOUS | Status: AC
  Filled 2016-09-06: qty 40

## 2016-09-06 MED ORDER — PHENYLEPHRINE 40 MCG/ML (10ML) SYRINGE FOR IV PUSH (FOR BLOOD PRESSURE SUPPORT)
PREFILLED_SYRINGE | INTRAVENOUS | Status: AC
Start: 2016-09-06 — End: 2016-09-06
  Filled 2016-09-06: qty 10

## 2016-09-06 MED ORDER — LACTATED RINGERS IV SOLN
INTRAVENOUS | Status: DC
  Administered 2016-09-06: 1000 mL via INTRAVENOUS

## 2016-09-06 MED ORDER — PHENYLEPHRINE 40 MCG/ML (10ML) SYRINGE FOR IV PUSH (FOR BLOOD PRESSURE SUPPORT)
PREFILLED_SYRINGE | INTRAVENOUS | Status: DC | PRN
  Administered 2016-09-06: 80 ug via INTRAVENOUS

## 2016-09-06 MED ORDER — LIDOCAINE 2% (20 MG/ML) 5 ML SYRINGE
INTRAMUSCULAR | Status: AC
  Filled 2016-09-06: qty 5

## 2016-09-06 MED ORDER — PROPOFOL 500 MG/50ML IV EMUL
INTRAVENOUS | Status: DC | PRN
  Administered 2016-09-06: 100 ug/kg/min via INTRAVENOUS

## 2016-09-06 SURGICAL SUPPLY — 21 items

## 2016-09-06 NOTE — H&P (Signed)
History:  This patient presents for endoscopic testing for colon cancer screening.  [name] [name] Referring physician: Hannah Beat, MD  Past Medical History: Past Medical History:  Diagnosis Date  . Angio-edema   . Anxiety   . Depression   . Diabetes mellitus type II    type 2  . GERD (gastroesophageal reflux disease)   . Headache    migraine  . History of kidney stones   . HLD (hyperlipidemia)   . HTN (hypertension)   . Morbid obesity (HCC)   . Restless legs syndrome (RLS) 11/20/2012  . Snores   . Tobacco abuse   . Ulcer of left ankle (HCC)    last 2 months dime size dry dressing changing q day  . Urticaria   . Wears glasses      Past Surgical History: Past Surgical History:  Procedure Laterality Date  . Arm surgery Right 1969   Abscess excision and debridement at elbow  . I&D EXTREMITY Left 05/29/2014   Procedure: IRRIGATION AND DEBRIDEMENT OF LEFT LEG WOUND AND PLACEMENT OF  INTEGRA  AND  VAC;  Surgeon: Wayland Denis, DO;  Location: [facility];  Service: Plastics;  Laterality: Left;  . INCISION AND DRAINAGE OF WOUND Left 04/23/2014   Procedure: IRRIGATION AND DEBRIDEMENT LOWER LEFT LEG WOUND WITH PLACEMENT OF INTEGRA AND VAC;  Surgeon: Wayland Denis, DO;  Location: [facility];  Service: Plastics;  Laterality: Left;  . kidney stone removal  2004  . MINOR APPLICATION OF WOUND VAC Left 04/23/2014   Procedure: MINOR APPLICATION OF WOUND VAC;  Surgeon: Wayland Denis, DO;  Location: [facility];  Service: Plastics;  Laterality: Left;  . SKIN SPLIT GRAFT Left 06/18/2014   Procedure: SKIN GRAFT SPLIT THICKNESS TO LOWER LEFT LEG WOUND WITH PLACEMENT OF VAC;  Surgeon: Wayland Denis, DO;  Location: [facility];  Service: Plastics;  Laterality: Left;  Marland Kitchen VASECTOMY  1994    Allergies: Allergies  Allergen Reactions  . Cashew Nut Oil Anaphylaxis    Cashews, 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  Marland Kitchen Betadine [Povidone Iodine]    Raised rash 04/23/14  . Latex Dermatitis  . Penicillins     REACTION: rash all over as child  . Lidocaine Other (See Comments)    Minimal effect with lidocaine, prefers different anesthetic    Outpatient Meds: Current Facility-Administered Medications  Medication Dose Route Frequency Provider Last Rate Last Dose  . 0.9 %  sodium chloride infusion   Intravenous Continuous [name], MD      . lactated ringers infusion   Intravenous Continuous [name], MD 50 mL/hr at 09/06/16 1009 1,000 mL at 09/06/16 1009      ___________________________________________________________________ Objective   Exam:  BP (!) 155/80   Pulse (!) 106   Temp 97.7 F (36.5 C) (Oral)   Resp (!) 28   SpO2 95%    CV: RRR without murmur, S1/S2, no JVD, no peripheral edema  Resp: clear to auscultation bilaterally, normal RR and effort noted  GI: morbidly obese, soft, no tenderness, with active bowel sounds. No guarding or palpable organomegaly noted.  Neuro: awake, alert and oriented x 3. Normal gross motor function and fluent speech   Assessment:  Average risk colon cancer screening  Plan:  Colonoscopy  The benefits and risks of the planned procedure were described in detail with the patient or (when appropriate) their health care proxy.  Risks were outlined as including, but not limited to, bleeding, infection,  perforation, adverse medication reaction leading to cardiac or pulmonary decompensation, or pancreatitis (if ERCP).  The limitation of incomplete mucosal visualization was also discussed.  No guarantees or warranties were given. Patient at increased risk for cardiopulmonary complications of procedure due to medical comorbidities.     [name]

## 2016-09-06 NOTE — Op Note (Signed)
[facility] Patient Name: [name] [name] Procedure Date: 09/06/2016 MRN: [id number] Attending MD: Estill Cotta. Loletha Carrow , MD Date of Birth: 1961/01/03 CSN: [id number] Age: 56 Admit Type: Outpatient Procedure:                Colonoscopy Indications:              Screening for malignant neoplasm in the colon, This                            is the patient's first colonoscopy Providers:                Mallie Mussel L. Loletha Carrow, MD, Elmer Ramp. Tilden Dome, RN, Corliss Parish, Technician Referring MD:             Owens Loffler, MD Medicines:                Monitored Anesthesia Care Complications:            No immediate complications. Estimated Blood Loss:     Estimated blood loss: none. Procedure:                Pre-Anesthesia Assessment:                           - Prior to the procedure, a History and Physical                            was performed, and patient medications and                            allergies were reviewed. The patient's tolerance of                            previous anesthesia was also reviewed. The risks                            and benefits of the procedure and the sedation                            options and risks were discussed with the patient.                            All questions were answered, and informed consent                            was obtained. Prior Anticoagulants: The patient has                            taken no previous anticoagulant or antiplatelet                            agents. ASA Grade Assessment: III - A patient with  severe systemic disease. After reviewing the risks                            and benefits, the patient was deemed in                            satisfactory condition to undergo the procedure.                           After obtaining informed consent, the colonoscope                            was passed under direct vision. Throughout the            procedure, the patient's blood pressure, pulse, and                            oxygen saturations were monitored continuously. The                            Colonoscope was introduced through the anus and                            advanced to the the cecum, identified by                            appendiceal orifice and ileocecal valve. The was                            introduced through the and advanced to the. The                            colonoscopy was performed without difficulty. The                            patient tolerated the procedure well. The quality                            of the bowel preparation was good. The ileocecal                            valve, appendiceal orifice, and rectum were                            photographed. The quality of the bowel preparation                            was evaluated using the BBPS [facility] Bowel                            Preparation Scale) with scores of: Right Colon = 2,                            Transverse Colon = 2 and Left  Colon = 2. The total                            BBPS score equals 6. The bowel preparation used was                            SUPREP. Scope In: 10:43:11 AM Scope Out: 10:56:53 AM Scope Withdrawal Time: 0 hours 9 minutes 27 seconds  Total Procedure Duration: 0 hours 13 minutes 42 seconds  Findings:      The perianal and digital rectal examinations were normal.      Diverticula were found in the left colon.      The exam was otherwise without abnormality on direct and retroflexion       views.      There was a medium-sized lipoma, in the proximal transverse colon. Impression:               - Diverticulosis in the left colon.                           - Medium-sized lipoma in the proximal transverse                            colon.                           - The examination was otherwise normal on direct                            and retroflexion views.                           - No  specimens collected. Moderate Sedation:      MAC sedation used Recommendation:           - Patient has a contact number available for                            emergencies. The signs and symptoms of potential                            delayed complications were discussed with the                            patient. Return to normal activities tomorrow.                            Written discharge instructions were provided to the                            patient.                           - Resume previous diet.                           - Continue present medications.                           -  Repeat colonoscopy in 10 years for screening                            purposes. Procedure Code(s):        --- Professional ---                           [phone], Colonoscopy, flexible; diagnostic, including                            collection of specimen(s) by brushing or washing,                            when performed (separate procedure) Diagnosis Code(s):        --- Professional ---                           Z12.11, Encounter for screening for malignant                            neoplasm of colon                           K57.30, Diverticulosis of large intestine without                            perforation or abscess without bleeding CPT copyright 2016 American Medical Association. All rights reserved. The codes documented in this report are preliminary and upon coder review may  be revised to meet current compliance requirements. Satoshi Kalas L. Loletha Carrow, MD 09/06/2016 11:02:39 AM This report has been signed electronically. Number of Addenda: 0

## 2016-09-06 NOTE — Anesthesia Preprocedure Evaluation (Signed)
Anesthesia Evaluation  Patient identified by MRN, date of birth, ID band Patient awake    Reviewed: Allergy & Precautions, H&P , NPO status , Patient's Chart, lab work & pertinent test results  Airway Mallampati: II   Neck ROM: full    Dental   Pulmonary Current Smoker,    breath sounds clear to auscultation       Cardiovascular hypertension,  Rhythm:regular Rate:Normal     Neuro/Psych  Headaches, PSYCHIATRIC DISORDERS Anxiety Depression    GI/Hepatic GERD  ,  Endo/Other  diabetes, Type 2  Renal/GU      Musculoskeletal   Abdominal   Peds  Hematology   Anesthesia Other Findings   Reproductive/Obstetrics                             Anesthesia Physical Anesthesia Plan  ASA: II  Anesthesia Plan: MAC   Post-op Pain Management:    Induction: Intravenous  Airway Management Planned: Simple Face Mask  Additional Equipment:   Intra-op Plan:   Post-operative Plan:   Informed Consent: I have reviewed the patients History and Physical, chart, labs and discussed the procedure including the risks, benefits and alternatives for the proposed anesthesia with the patient or authorized representative who has indicated his/her understanding and acceptance.     Plan Discussed with: CRNA, Anesthesiologist and Surgeon  Anesthesia Plan Comments:         Anesthesia Quick Evaluation

## 2016-09-06 NOTE — Transfer of Care (Signed)
Immediate Anesthesia Transfer of Care Note  Patient: [name] [name]  Procedure(s) Performed: Procedure(s): COLONOSCOPY WITH PROPOFOL (N/A)  Patient Location: PACU and Endoscopy Unit  Anesthesia Type:MAC  Level of Consciousness: awake, alert  and patient cooperative  Airway & Oxygen Therapy: Patient Spontanous Breathing and Patient connected to face mask oxygen  Post-op Assessment: Report given to RN and Post -op Vital signs reviewed and stable  Post vital signs: Reviewed and stable  Last Vitals:  Vitals:   09/06/16 0955 09/06/16 1102  BP: (!) 155/80   Pulse: (!) 106 95  Resp: (!) 28 18  Temp: 36.5 C 36.5 C    Last Pain:  Vitals:   09/06/16 1102  TempSrc: Oral         Complications: No apparent anesthesia complications

## 2016-09-06 NOTE — Interval H&P Note (Signed)
History and Physical Interval Note:  09/06/2016 10:32 AM  [name] [name]  has presented today for surgery, with the diagnosis of Colonoscopy tt  The various methods of treatment have been discussed with the patient and family. After consideration of risks, benefits and other options for treatment, the patient has consented to  Procedure(s): COLONOSCOPY WITH PROPOFOL (N/A) as a surgical intervention .  The patient's history has been reviewed, patient examined, no change in status, stable for surgery.  I have reviewed the patient's chart and labs.  Questions were answered to the patient's satisfaction.     [name]

## 2016-09-06 NOTE — Discharge Instructions (Signed)
YOU HAD AN ENDOSCOPIC PROCEDURE TODAY: Refer to the procedure report and other information in the discharge instructions given to you for any specific questions about what was found during the examination. If this information does not answer your questions, please call [place] office at [phone] to clarify.  ° °YOU SHOULD EXPECT: Some feelings of bloating in the abdomen. Passage of more gas than usual. Walking can help get rid of the air that was put into your GI tract during the procedure and reduce the bloating. If you had a lower endoscopy (such as a colonoscopy or flexible sigmoidoscopy) you may notice spotting of blood in your stool or on the toilet paper. Some abdominal soreness may be present for a day or two, also. ° °DIET: Your first meal following the procedure should be a light meal and then it is ok to progress to your normal diet. A half-sandwich or bowl of soup is an example of a good first meal. Heavy or fried foods are harder to digest and may make you feel nauseous or bloated. Drink plenty of fluids but you should avoid alcoholic beverages for 24 hours. If you had a esophageal dilation, please see attached instructions for diet.   ° °ACTIVITY: Your care partner should take you home directly after the procedure. You should plan to take it easy, moving slowly for the rest of the day. You can resume normal activity the day after the procedure however YOU SHOULD NOT DRIVE, use power tools, machinery or perform tasks that involve climbing or major physical exertion for 24 hours (because of the sedation medicines used during the test).  ° °SYMPTOMS TO REPORT IMMEDIATELY: °A gastroenterologist can be reached at any hour. Please call [phone]  for any of the following symptoms:  °Following lower endoscopy (colonoscopy, flexible sigmoidoscopy) °Excessive amounts of blood in the stool  °Significant tenderness, worsening of abdominal pains  °Swelling of the abdomen that is new, acute  °Fever of 100° or  higher  °Following upper endoscopy (EGD, EUS, ERCP, esophageal dilation) °Vomiting of blood or coffee ground material  °New, significant abdominal pain  °New, significant chest pain or pain under the shoulder blades  °Painful or persistently difficult swallowing  °New shortness of breath  °Black, tarry-looking or red, bloody stools ° °FOLLOW UP:  °If any biopsies were taken you will be contacted by phone or by letter within the next 1-3 weeks. Call [phone]  if you have not heard about the biopsies in 3 weeks.  °Please also call with any specific questions about appointments or follow up tests. ° °

## 2016-09-06 NOTE — Anesthesia Postprocedure Evaluation (Signed)
Anesthesia Post Note  Patient: [name] [name]  Procedure(s) Performed: Procedure(s) (LRB): COLONOSCOPY WITH PROPOFOL (N/A)  Patient location during evaluation: PACU Anesthesia Type: MAC Level of consciousness: awake and alert Pain management: pain level controlled Vital Signs Assessment: post-procedure vital signs reviewed and stable Respiratory status: spontaneous breathing, nonlabored ventilation, respiratory function stable and patient connected to nasal cannula oxygen Cardiovascular status: stable and blood pressure returned to baseline Anesthetic complications: no       Last Vitals:  Vitals:   09/06/16 1110 09/06/16 1120  BP: 128/77 132/78  Pulse: (!) 109 100  Resp: 19 18  Temp:      Last Pain:  Vitals:   09/06/16 1102  TempSrc: Oral                 Freeda Spivey S

## 2016-09-09 LAB — IGE NUT PROF. W/COMPONENT RFLX
Brazil Nut IgE: <0.1 kU/L
F020-IgE Almond: <0.1 kU/L
F202-IGE CASHEW NUT: 0.18 kU/L — AB
F203-IGE PISTACHIO NUT: 0.17 kU/L — AB
Hazelnut (Filbert) IgE: <0.1 kU/L

## 2016-09-09 LAB — TRYPTASE: TRYPTASE: 6.1 ug/L (ref 2.2–13.2)

## 2016-09-09 LAB — PANEL 604239: F443-IGE ANA O 3: 0.17 kU/L — AB

## 2016-10-14 ENCOUNTER — Other Ambulatory Visit: Payer: Self-pay | Admitting: Family Medicine

## 2016-10-14 NOTE — Telephone Encounter (Signed)
Last office visit 07/27/2016.  Last refilled 04/20/2016 for #50 with no refills.

## 2016-10-16 ENCOUNTER — Other Ambulatory Visit: Payer: Self-pay | Admitting: Family Medicine

## 2016-10-17 ENCOUNTER — Other Ambulatory Visit: Payer: Self-pay | Admitting: Family Medicine

## 2016-10-17 MED ORDER — MELOXICAM 15 MG PO TABS: ORAL_TABLET | ORAL | 0 refills | Status: DC

## 2016-10-17 NOTE — Telephone Encounter (Signed)
PCP may be back in next day.Marland Kitchen. Await refill as this was not discussed per last 2 OV notes and  Pt overdue for UDS. Also pt not using daily instead prn. If PCP not returning, I will reconsider refill.  09/01/15 controlled contract signed, uds sample given, Low risk with a yearly screen per doctor Copland, next screen 08/31/16.

## 2016-10-17 NOTE — Telephone Encounter (Signed)
Duplicate request

## 2016-10-17 NOTE — Addendum Note (Signed)
Addended by: Damita LackLORING, DONNA S on: 10/17/2016 10:49 AM   Modules accepted: Orders

## 2016-10-17 NOTE — Telephone Encounter (Signed)
Last office visit 07/27/2016.  Last refilled 06/05/2016 for #90 with no refills.  Ok to refill?

## 2016-10-17 NOTE — Telephone Encounter (Signed)
Duplicate Request.

## 2016-10-19 ENCOUNTER — Other Ambulatory Visit: Payer: Self-pay | Admitting: Family Medicine

## 2016-10-19 ENCOUNTER — Other Ambulatory Visit: Payer: 59

## 2016-10-19 ENCOUNTER — Encounter: Payer: Self-pay | Admitting: Family Medicine

## 2016-10-19 DIAGNOSIS — G2581 Restless legs syndrome: Secondary | ICD-10-CM

## 2016-10-19 MED ORDER — OXYCODONE-ACETAMINOPHEN 10-325 MG PO TABS: 1.0000 | ORAL_TABLET | ORAL | 0 refills | Status: DC | PRN

## 2016-10-21 ENCOUNTER — Other Ambulatory Visit: Payer: Self-pay | Admitting: Family Medicine

## 2016-10-25 LAB — TOXASSURE SELECT 13 (MW), URINE

## 2016-11-29 ENCOUNTER — Other Ambulatory Visit: Payer: Self-pay | Admitting: Family Medicine

## 2016-12-18 ENCOUNTER — Other Ambulatory Visit: Payer: Self-pay | Admitting: Family Medicine

## 2016-12-30 ENCOUNTER — Encounter: Payer: Self-pay | Admitting: Family Medicine

## 2016-12-30 MED ORDER — SILDENAFIL CITRATE 20 MG PO TABS: ORAL_TABLET | ORAL | 5 refills | Status: DC

## 2017-01-13 ENCOUNTER — Other Ambulatory Visit: Payer: Self-pay | Admitting: Family Medicine

## 2017-01-13 NOTE — Telephone Encounter (Signed)
Dr. Patsy Lager   Received paper refill requests for pt's Pioglitazone and Ropinirole. Please advise if ok to refill. Last OV 07/27/16  Pioglitazone- last filled 07/27/16 Qty 90 RF 1 Ropinirole- last filled 07/27/16 Qty 180 RF 1

## 2017-01-16 MED ORDER — PIOGLITAZONE HCL 45 MG PO TABS: 45.0000 mg | ORAL_TABLET | Freq: Every day | ORAL | 1 refills | Status: DC

## 2017-01-16 MED ORDER — ROPINIROLE HCL 0.5 MG PO TABS: 0.5000 mg | ORAL_TABLET | Freq: Two times a day (BID) | ORAL | 1 refills | Status: DC

## 2017-01-16 NOTE — Addendum Note (Signed)
Addended by: Damita Lack on: 01/16/2017 10:29 AM   Modules accepted: Orders

## 2017-01-19 ENCOUNTER — Encounter: Payer: Self-pay | Admitting: Family Medicine

## 2017-01-20 MED ORDER — PIOGLITAZONE HCL 45 MG PO TABS: 45.0000 mg | ORAL_TABLET | Freq: Every day | ORAL | 1 refills | Status: DC

## 2017-02-26 ENCOUNTER — Other Ambulatory Visit: Payer: Self-pay | Admitting: Family Medicine

## 2017-03-05 ENCOUNTER — Other Ambulatory Visit: Payer: Self-pay | Admitting: Family Medicine

## 2017-03-15 ENCOUNTER — Encounter: Payer: Self-pay | Admitting: Family Medicine

## 2017-03-23 ENCOUNTER — Encounter: Payer: Self-pay | Admitting: Family Medicine

## 2017-03-23 ENCOUNTER — Ambulatory Visit (INDEPENDENT_AMBULATORY_CARE_PROVIDER_SITE_OTHER): Payer: 59 | Admitting: Family Medicine

## 2017-03-23 VITALS — BP 110/62 | HR 89 | Temp 98.2°F | Ht 71.0 in | Wt 380.8 lb

## 2017-03-23 DIAGNOSIS — G894 Chronic pain syndrome: Secondary | ICD-10-CM | POA: Diagnosis not present

## 2017-03-23 DIAGNOSIS — Z23 Encounter for immunization: Secondary | ICD-10-CM | POA: Diagnosis not present

## 2017-03-23 DIAGNOSIS — I999 Unspecified disorder of circulatory system: Secondary | ICD-10-CM | POA: Diagnosis not present

## 2017-03-23 DIAGNOSIS — L97929 Non-pressure chronic ulcer of unspecified part of left lower leg with unspecified severity: Secondary | ICD-10-CM

## 2017-03-23 DIAGNOSIS — Z6841 Body Mass Index (BMI) 40.0 and over, adult: Secondary | ICD-10-CM

## 2017-03-23 DIAGNOSIS — I10 Essential (primary) hypertension: Secondary | ICD-10-CM | POA: Diagnosis not present

## 2017-03-23 DIAGNOSIS — F17200 Nicotine dependence, unspecified, uncomplicated: Secondary | ICD-10-CM

## 2017-03-23 DIAGNOSIS — E11622 Type 2 diabetes mellitus with other skin ulcer: Secondary | ICD-10-CM | POA: Diagnosis not present

## 2017-03-23 DIAGNOSIS — L97909 Non-pressure chronic ulcer of unspecified part of unspecified lower leg with unspecified severity: Secondary | ICD-10-CM

## 2017-03-23 DIAGNOSIS — R899 Unspecified abnormal finding in specimens from other organs, systems and tissues: Secondary | ICD-10-CM

## 2017-03-23 MED ORDER — PIOGLITAZONE HCL 45 MG PO TABS: 45.0000 mg | ORAL_TABLET | Freq: Every day | ORAL | 3 refills | Status: DC

## 2017-03-23 MED ORDER — METFORMIN HCL ER 500 MG PO TB24: ORAL_TABLET | ORAL | 3 refills | Status: DC

## 2017-03-23 MED ORDER — OXYCODONE-ACETAMINOPHEN 10-325 MG PO TABS: 1.0000 | ORAL_TABLET | ORAL | 0 refills | Status: DC | PRN

## 2017-03-23 MED ORDER — GLIPIZIDE ER 10 MG PO TB24: 20.0000 mg | ORAL_TABLET | Freq: Every day | ORAL | 3 refills | Status: DC

## 2017-03-23 MED ORDER — SIMVASTATIN 40 MG PO TABS: 40.0000 mg | ORAL_TABLET | Freq: Every day | ORAL | 3 refills | Status: DC

## 2017-03-23 MED ORDER — MELOXICAM 15 MG PO TABS: ORAL_TABLET | ORAL | 3 refills | Status: DC

## 2017-03-23 MED ORDER — PAROXETINE HCL 30 MG PO TABS: 30.0000 mg | ORAL_TABLET | Freq: Every morning | ORAL | 3 refills | Status: DC

## 2017-03-23 MED ORDER — PENTOXIFYLLINE ER 400 MG PO TBCR: EXTENDED_RELEASE_TABLET | ORAL | 3 refills | Status: DC

## 2017-03-23 MED ORDER — LISINOPRIL-HYDROCHLOROTHIAZIDE 20-12.5 MG PO TABS: 2.0000 | ORAL_TABLET | Freq: Every day | ORAL | 3 refills | Status: DC

## 2017-03-23 MED ORDER — PANTOPRAZOLE SODIUM 40 MG PO TBEC: 40.0000 mg | DELAYED_RELEASE_TABLET | Freq: Every day | ORAL | 3 refills | Status: DC

## 2017-03-23 MED ORDER — ROPINIROLE HCL 0.5 MG PO TABS: 0.5000 mg | ORAL_TABLET | Freq: Two times a day (BID) | ORAL | 3 refills | Status: DC

## 2017-03-23 NOTE — Progress Notes (Signed)
Dr. Frederico Hamman T. Cheryel Kyte, MD, [facility] [place], [address] Phone: [phone] Fax: [phone]  03/23/2017  Patient: [name] [name], MRN: [id number], DOB: 1960-10-08, 56 y.o.  Primary Physician:  [name] Loffler, MD   Chief Complaint  Patient presents with  . Diabetes   Subjective:   [name] [name] is a 56 y.o. very pleasant male patient who presents with the following:  Diabetes Mellitus: Tolerating Medications: yes Compliance with diet: fair Exercise: minimal / intermittent Avg blood sugars at home: 100 - 200+ Foot problems: none, but skin breakdown again 3 on the left lower extremity, this is an area that had a huge diabetic ulcer requiring wound management by wound care as well as plastic surgery and surgical intervention. Hypoglycemia: none No nausea, vomitting, blurred vision, polyuria.  Lab Results  Component Value Date   HGBA1C 7.8 (H) 03/23/2017   HGBA1C 7.8 (H) 07/20/2016   HGBA1C 9.0 (H) 04/08/2016   Lab Results  Component Value Date   MICROALBUR 2.82 (H) 07/27/2007   LDLCALC 70 04/08/2016   CREATININE 1.49 (H) 03/23/2017    Wt Readings from Last 3 Encounters:  03/23/17 (!) 380 lb 12 oz (172.7 kg)  09/02/16 (!) 367 lb (166.5 kg)  07/27/16 (!) 361 lb 8 oz (164 kg)   Body mass index is 53.1 kg/m.   L x 3 diabetic wound ulcers.   Indication for chronic opioid: chronic pain syndrome, diabetic ulcers Medication and dose: percocet 10, prn # pills per month: <10 Last UDS date: 10/19/2016 Pain contract signed (Y/N): y Date narcotic database last reviewed (include red flags): 03/23/2017   HTN: Tolerating all medications without side effects Stable and at goal No CP, no sob. No HA.  BP Readings from Last 3 Encounters:  03/23/17 110/62  09/06/16 132/78  09/02/16 563/14    Basic Metabolic Panel:    Component Value Date/Time   NA 144 03/23/2017 1643   K 5.1 03/23/2017 1643   CL 103  03/23/2017 1643   CO2 26 03/23/2017 1643   BUN 25 (H) 03/23/2017 1643   CREATININE 1.49 (H) 03/23/2017 1643   GLUCOSE 161 (H) 03/23/2017 1643   GLUCOSE 158 (H) 06/18/2014 0923   CALCIUM 9.8 03/23/2017 1643     Past Medical History, Surgical History, Social History, Family History, Problem List, Medications, and Allergies have been reviewed and updated if relevant.  Patient Active Problem List   Diagnosis Date Noted  . Diabetic leg ulcer ([place]) 07/18/2013    Priority: High  . 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 Morbid obesity with BMI of 50.0-59.9, adult ([place]) 09/29/2006    Priority: High  . Hyperlipidemia LDL goal <70 01/16/2009    Priority: Medium  . TOBACCO ABUSE 07/27/2007    Priority: Medium  . Essential hypertension 09/29/2006    Priority: Medium  . Circulation disorder of lower extremity 05/31/2015  . BPH (benign prostatic hyperplasia) 12/24/2014  . IBS (irritable bowel syndrome) 12/24/2014  . 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 Restless legs syndrome (RLS) 11/20/2012  . Generalized anxiety disorder   . CALCULUS, KIDNEY 05/30/2002    Past Medical History:  Diagnosis Date  . Angio-edema   . Anxiety   . Depression   . Diabetes mellitus type II    type 2  . GERD (gastroesophageal reflux disease)   . Headache  migraine  . History of kidney stones   . HLD (hyperlipidemia)   . HTN (hypertension)   . Morbid obesity (HCC)   . Restless legs syndrome (RLS) 11/20/2012  . Snores   . Tobacco abuse   . Ulcer of left ankle (HCC)    last 2 months dime size dry dressing changing q day  . Urticaria   . Wears glasses     Past Surgical History:  Procedure Laterality Date  . Arm surgery Right 1969   Abscess excision and debridement at elbow  . COLONOSCOPY WITH PROPOFOL N/A 09/06/2016   Procedure: COLONOSCOPY WITH PROPOFOL;  Surgeon: Henry L Danis III, MD;  Location: [facility];   Service: Gastroenterology;  Laterality: N/A;  . I&D EXTREMITY Left 05/29/2014   Procedure: IRRIGATION AND DEBRIDEMENT OF LEFT LEG WOUND AND PLACEMENT OF  INTEGRA  AND  VAC;  Surgeon: Claire Sanger, DO;  Location: [facility];  Service: Plastics;  Laterality: Left;  . INCISION AND DRAINAGE OF WOUND Left 04/23/2014   Procedure: IRRIGATION AND DEBRIDEMENT LOWER LEFT LEG WOUND WITH PLACEMENT OF INTEGRA AND VAC;  Surgeon: Claire Sanger, DO;  Location: [facility];  Service: Plastics;  Laterality: Left;  . kidney stone removal  2004  . MINOR APPLICATION OF WOUND VAC Left 04/23/2014   Procedure: MINOR APPLICATION OF WOUND VAC;  Surgeon: Claire Sanger, DO;  Location: [facility];  Service: Plastics;  Laterality: Left;  . SKIN SPLIT GRAFT Left 06/18/2014   Procedure: SKIN GRAFT SPLIT THICKNESS TO LOWER LEFT LEG WOUND WITH PLACEMENT OF VAC;  Surgeon: Claire Sanger, DO;  Location: [facility];  Service: Plastics;  Laterality: Left;  . VASECTOMY  1994    Social History   Social History  . Marital status: Married    Spouse name: N/A  . Number of children: 3  . Years of education: N/A   Occupational History  . [employer]ing and receivables    Social History [name] Topics  . Smoking status: Current Every Day Smoker    Packs/day: 0.25    Years: 40.00    Types: Cigarettes  . Smokeless tobacco: Never Used     Comment: one cigarette daily  . Alcohol use 0.0 oz/week     Comment: rarely  . Drug use: No  . Sexual activity: Yes    Partners: Female   Other Topics Concern  . Not on file   Social History Narrative  . No narrative on file    Family History  Problem Relation Age of Onset  . Stroke Mother        multiple  . Leukemia Mother   . Pneumonia Mother   . Cancer Mother   . Diabetes Mother   . Diabetes Father   . Heart disease Father   . Hyperlipidemia Father   . Hypertension Father   . Allergic rhinitis Neg Hx   .  Angioedema Neg Hx   . Asthma Neg Hx   . Eczema Neg Hx   . Immunodeficiency Neg Hx   . Urticaria Neg Hx     Allergies  Allergen Reactions  . Cashew Nut Oil Anaphylaxis    Cashews, 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  . Pistachio Nut (Diagnostic) Hives, Itching, Rash, Shortness Of Breath and Swelling  . Betadine [Povidone Iodine]     Raised rash 04/23/14  . Latex Dermatitis  . Penicillins     REACTION: rash all over as child  . Lidocaine Other (See Comments)      Minimal effect with lidocaine, prefers different anesthetic    Medication list reviewed and updated in full in [place].   GEN: No acute illnesses, no fevers, chills. GI: No n/v/d, eating normally Pulm: No SOB Interactive and getting along well at home.  Otherwise, ROS is as per the HPI.  Objective:   BP 110/62   Pulse 89   Temp 98.2 F (36.8 C) (Oral)   Ht 5' 11" (1.803 m)   Wt (!) 380 lb 12 oz (172.7 kg)   BMI 53.10 kg/m   GEN: WDWN, NAD, Non-toxic, A & O x 3 HEENT: Atraumatic, Normocephalic. Neck supple. No masses, No LAD. Ears and Nose: No external deformity. CV: RRR, No M/G/R. No JVD. No thrill. No extra heart sounds. PULM: CTA B, no wheezes, crackles, rhonchi. No retractions. No resp. distress. No accessory muscle use. EXTR: No c/c/e. On the left lower extremity distal to the area of the large prior muscle graft, there is 3 areas of skin breakdown with minor ulceration and these do have the appearance of some good granulation tissue with what appears to be good blood supply to it. NEURO Normal gait.  PSYCH: Normally interactive. Conversant. Not depressed or anxious appearing.  Calm demeanor.   Laboratory and Imaging Data: Results for orders placed or performed in visit on 12/87/86  Basic metabolic panel  Result Value Ref Range   Glucose 161 (H) 65 - 99 mg/dL   BUN 25 (H) 6 - 24 mg/dL   Creatinine, Ser 1.49 (H) 0.76 - 1.27 mg/dL   GFR calc non Af Amer 52 (L) >59 mL/min/1.73   GFR calc Af Amer 60 >59  mL/min/1.73   BUN/Creatinine Ratio 17 9 - 20   Sodium 144 134 - 144 mmol/L   Potassium 5.1 3.5 - 5.2 mmol/L   Chloride 103 96 - 106 mmol/L   CO2 26 20 - 29 mmol/L   Calcium 9.8 8.7 - 10.2 mg/dL  Hemoglobin A1c  Result Value Ref Range   Hgb A1c MFr Bld 7.8 (H) 4.8 - 5.6 %   Est. 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  I'm quite concerned about the ulcers that he has on his left lower extremity again.  I recommended that he see wound care again in some capacity, and I feel like this is beyond the scope of my care.  He declined to see any wound care specialist in this region of the state and he declined to see any and all surgeons and wound care specialist with the exception of one plastic surgeon who help to take care of him previously but not now.  This is a challenge, and I think he understands that there is potential to lose this leg if this goes [place].  I explained all of this explicitly.  His wife is a Marine scientist, RN, and they have been dressing this and watching this themselves and it does appear there is some good granulation tissue  currently.  Additional challenges her continued weight gain with diabetes, but A1c is 7.8.  Continued smoking.  I did refill some of his pain medication which is used very infrequently in the past.  Follow-up: Return in about 3 months (around 06/23/2017).  Future Appointments Date Time Provider [place]  06/26/2017  2:45 PM Zannie Locastro, [name] Hamman, MD [facility]    Meds ordered this encounter  Medications  . simvastatin (ZOCOR) 40 MG tablet    Sig: Take 1 tablet (40 mg total) by mouth at bedtime.    Dispense:  90 tablet    Refill:  3  . pioglitazone (ACTOS) 45 MG tablet    Sig: Take 1 tablet (45 mg total) by mouth daily.    Dispense:  90 tablet    Refill:  3  . metFORMIN (GLUCOPHAGE-XR) 500 MG 24 hr tablet    Sig: TAKE 4 TABLETS DAILY WITH  BREAKFAST    Dispense:  360 tablet    Refill:  3  . lisinopril-hydrochlorothiazide (PRINZIDE,ZESTORETIC) 20-12.5 MG tablet    Sig: Take 2 tablets by mouth daily.    Dispense:  180 tablet    Refill:  3  . glipiZIDE (GLUCOTROL XL) 10 MG 24 hr tablet    Sig: Take 2 tablets (20 mg total) by mouth daily with breakfast.    Dispense:  180 tablet    Refill:  3  . rOPINIRole (REQUIP) 0.5 MG tablet    Sig: Take 1 tablet (0.5 mg total) by mouth 2 (two) times daily.    Dispense:  180 tablet    Refill:  3  . pentoxifylline (TRENTAL) 400 MG CR tablet    Sig: TAKE 1 TABLET 3 TIMES DAILYWITH MEALS    Dispense:  270 tablet    Refill:  3  . PARoxetine (PAXIL) 30 MG tablet    Sig: Take 1 tablet (30 mg total) by mouth every morning.    Dispense:  90 tablet    Refill:  3  . pantoprazole (PROTONIX) 40 MG tablet    Sig: Take 1 tablet (40 mg total) by mouth daily.    Dispense:  90 tablet    Refill:  3  . meloxicam (MOBIC) 15 MG tablet    Sig: TAKE 15 MG BY MOUTH EACH MORNING    Dispense:  90 tablet    Refill:  3  . oxyCODONE-acetaminophen (PERCOCET) 10-325 MG tablet    Sig: Take 1 tablet by mouth every 4 (four) hours as needed.    Dispense:  50 tablet    Refill:  0   Medications Discontinued During This Encounter  Medication Reason  . simvastatin (ZOCOR) 40 MG tablet Reorder  . pioglitazone (ACTOS) 45 MG tablet Reorder  . metFORMIN (GLUCOPHAGE-XR) 500 MG 24 hr tablet Reorder  . lisinopril-hydrochlorothiazide (PRINZIDE,ZESTORETIC) 20-12.5 MG tablet Reorder  .  glipiZIDE (GLUCOTROL XL) 10 MG 24 hr tablet Reorder  . rOPINIRole (REQUIP) 0.5 MG tablet Reorder  . pentoxifylline (TRENTAL) 400 MG CR tablet Reorder  . PARoxetine (PAXIL) 30 MG tablet Reorder  . pantoprazole (PROTONIX) 40 MG tablet Reorder  . meloxicam (MOBIC) 15 MG tablet Reorder  . oxyCODONE-acetaminophen (PERCOCET) 10-325 MG tablet Reorder   Orders Placed This Encounter  Procedures  . Flu Vaccine QUAD 36+ mos IM  . Basic metabolic panel  . Hemoglobin A1c    Signed,  Linzy Darling T. Anjulie Dipierro, MD   Allergies as of 03/23/2017      Reactions   Cashew Nut Oil Anaphylaxis   Cashews, anaphylaxis  06/06/2016.   Pistachio Nut (diagnostic) Hives, Itching, Rash, Shortness Of Breath, Swelling   Betadine [povidone Iodine]    Raised rash 04/23/14   Latex Dermatitis   Penicillins    REACTION: rash all over as child   Lidocaine Other (See Comments)   Minimal effect with lidocaine, prefers different anesthetic      Medication List       Accurate as of 03/23/17 11:59 PM. Always use your most recent med list.          EPINEPHrine 0.3 mg/0.3 mL Soaj injection Commonly known as:  EPI-PEN   glipiZIDE 10 MG 24 hr tablet Commonly known as:  GLUCOTROL XL Take 2 tablets (20 mg total) by mouth daily with breakfast.   glucose blood test strip Commonly known as:  ONETOUCH VERIO Use to check blood sugar two times a day.  Dx: E11.622/L97.929   lisinopril-hydrochlorothiazide 20-12.5 MG tablet Commonly known as:  PRINZIDE,ZESTORETIC Take 2 tablets by mouth daily.   meloxicam 15 MG tablet Commonly known as:  MOBIC TAKE 15 MG BY MOUTH EACH MORNING   metFORMIN 500 MG 24 hr tablet Commonly known as:  GLUCOPHAGE-XR TAKE 4 TABLETS DAILY WITH  BREAKFAST   multivitamin tablet Take 1 tablet by mouth daily.   ONETOUCH DELICA LANCETS 33G Misc 1 each by Does not apply route 2 (two) times daily. E11.622/L97.929   ONETOUCH VERIO FLEX SYSTEM w/Device Kit 1 each by Does not apply route 2 (two)  times daily. Dx: E11.622/L97.929   oxyCODONE-acetaminophen 10-325 MG tablet Commonly known as:  PERCOCET Take 1 tablet by mouth every 4 (four) hours as needed.   pantoprazole 40 MG tablet Commonly known as:  PROTONIX Take 1 tablet (40 mg total) by mouth daily.   PARoxetine 30 MG tablet Commonly known as:  PAXIL Take 1 tablet (30 mg total) by mouth every morning.   pentoxifylline 400 MG CR tablet Commonly known as:  TRENTAL TAKE 1 TABLET 3 TIMES DAILYWITH MEALS   pioglitazone 45 MG tablet Commonly known as:  ACTOS Take 1 tablet (45 mg total) by mouth daily.   rOPINIRole 0.5 MG tablet Commonly known as:  REQUIP Take 1 tablet (0.5 mg total) by mouth 2 (two) times daily.   sildenafil 20 MG tablet Commonly known as:  REVATIO Generic Revatio / Sildanefil 20 mg. 2 - 5 tabs 30 mins prior to intercourse.   Make sure he understands this is cash / credit card, not to be run through insurance.   simvastatin 40 MG tablet Commonly known as:  ZOCOR Take 1 tablet (40 mg total) by mouth at bedtime.   vitamin C 500 MG tablet Commonly known as:  ASCORBIC ACID Take 500 mg by mouth daily.       

## 2017-03-24 LAB — BASIC METABOLIC PANEL
BUN/Creatinine Ratio: 17 (ref 9–20)
BUN: 25 mg/dL — ABNORMAL HIGH (ref 6–24)
CHLORIDE: 103 mmol/L (ref 96–106)
CO2: 26 mmol/L (ref 20–29)
Calcium: 9.8 mg/dL (ref 8.7–10.2)
Creatinine, Ser: 1.49 mg/dL — ABNORMAL HIGH (ref 0.76–1.27)
GFR, EST AFRICAN AMERICAN: 60 mL/min/{1.73_m2} (ref >59)
GFR, EST NON AFRICAN AMERICAN: 52 mL/min/{1.73_m2} — AB (ref >59)
Glucose: 161 mg/dL — ABNORMAL HIGH (ref 65–99)
POTASSIUM: 5.1 mmol/L (ref 3.5–5.2)
Sodium: 144 mmol/L (ref 134–144)

## 2017-03-24 LAB — HEMOGLOBIN A1C
Est. average glucose Bld gHb Est-mCnc: 177 mg/dL
Hgb A1c MFr Bld: 7.8 % — ABNORMAL HIGH (ref 4.8–5.6)

## 2017-03-27 NOTE — Addendum Note (Signed)
Addended by: Shailyn Weyandt S on: 03/27/2017 11:00 AM   Modules accepted: Orders  

## 2017-03-27 NOTE — Addendum Note (Signed)
Addended by: Damita LackLORING, DONNA S on: 03/27/2017 11:00 AM   Modules accepted: Orders

## 2017-04-13 ENCOUNTER — Other Ambulatory Visit: Payer: Self-pay | Admitting: Family Medicine

## 2017-04-13 MED ORDER — ONETOUCH DELICA LANCETS 33G MISC: 1.0000 | Freq: Two times a day (BID) | 3 refills | Status: DC

## 2017-04-13 NOTE — Addendum Note (Signed)
Addended by: Damita LackLORING, Flynt Breeze S on: 04/13/2017 12:14 PM   Modules accepted: Orders

## 2017-06-07 ENCOUNTER — Encounter: Payer: Self-pay | Admitting: Family Medicine

## 2017-06-08 NOTE — Telephone Encounter (Signed)
Can you reclassify his upcoming office visit as a complete physical?

## 2017-06-11 MED ORDER — METFORMIN HCL ER 500 MG PO TB24: ORAL_TABLET | ORAL | 3 refills | Status: DC

## 2017-06-11 MED ORDER — PANTOPRAZOLE SODIUM 40 MG PO TBEC: 40.0000 mg | DELAYED_RELEASE_TABLET | Freq: Every day | ORAL | 3 refills | Status: DC

## 2017-06-11 MED ORDER — LISINOPRIL-HYDROCHLOROTHIAZIDE 20-12.5 MG PO TABS: 2.0000 | ORAL_TABLET | Freq: Every day | ORAL | 3 refills | Status: DC

## 2017-06-11 MED ORDER — GLIPIZIDE ER 10 MG PO TB24: 20.0000 mg | ORAL_TABLET | Freq: Every day | ORAL | 3 refills | Status: DC

## 2017-06-11 MED ORDER — PENTOXIFYLLINE ER 400 MG PO TBCR: EXTENDED_RELEASE_TABLET | ORAL | 3 refills | Status: DC

## 2017-06-11 MED ORDER — MELOXICAM 15 MG PO TABS: ORAL_TABLET | ORAL | 3 refills | Status: DC

## 2017-06-11 MED ORDER — ROPINIROLE HCL 0.5 MG PO TABS: 0.5000 mg | ORAL_TABLET | Freq: Two times a day (BID) | ORAL | 3 refills | Status: DC

## 2017-06-11 MED ORDER — PIOGLITAZONE HCL 45 MG PO TABS: 45.0000 mg | ORAL_TABLET | Freq: Every day | ORAL | 3 refills | Status: DC

## 2017-06-11 MED ORDER — SIMVASTATIN 40 MG PO TABS: 40.0000 mg | ORAL_TABLET | Freq: Every day | ORAL | 3 refills | Status: DC

## 2017-06-11 MED ORDER — PAROXETINE HCL 30 MG PO TABS: 30.0000 mg | ORAL_TABLET | Freq: Every morning | ORAL | 3 refills | Status: DC

## 2017-06-26 ENCOUNTER — Encounter: Payer: 59 | Admitting: Family Medicine

## 2017-06-26 ENCOUNTER — Encounter: Payer: Self-pay | Admitting: Family Medicine

## 2017-06-26 ENCOUNTER — Ambulatory Visit (INDEPENDENT_AMBULATORY_CARE_PROVIDER_SITE_OTHER): Payer: Managed Care, Other (non HMO) | Admitting: Family Medicine

## 2017-06-26 ENCOUNTER — Other Ambulatory Visit: Payer: Self-pay

## 2017-06-26 VITALS — BP 130/62 | HR 94 | Temp 98.4°F | Ht 70.25 in | Wt 370.5 lb

## 2017-06-26 DIAGNOSIS — Z0000 Encounter for general adult medical examination without abnormal findings: Secondary | ICD-10-CM | POA: Diagnosis not present

## 2017-06-26 NOTE — Progress Notes (Signed)
Dr. Frederico Hamman T. Jaleil Renwick, MD, [facility] [place], [address] Phone: [phone] Fax: [phone]  06/26/2017  Patient: [name] [name], MRN: [id number], DOB: Jul 18, 1960, 57 y.o.  Primary Physician:  Owens Loffler, MD   Chief Complaint  Patient presents with  . Annual Exam   Subjective:   [name] [name] is a 57 y.o. pleasant patient who presents with the following:  Preventative Health Maintenance Visit:  Health Maintenance Summary Reviewed and updated, unless pt declines services.  Tobacco History Reviewed. Alcohol: No concerns, no excessive use Exercise Habits: rare activity, rec at least 30 mins 5 times a week STD concerns: no risk or activity to increase risk Drug Use: None Encouraged self-testicular check  LLE ulcers x 2, healing and doing well with only his care and RN wife's care. One on foot fully healed.   Health Maintenance  Topic Date Due  . FOOT EXAM  04/20/2017  . OPHTHALMOLOGY EXAM  04/20/2017  . HEMOGLOBIN A1C  09/21/2017  . TETANUS/TDAP  04/20/2026  . COLONOSCOPY  09/07/2026  . PNEUMOCOCCAL POLYSACCHARIDE VACCINE  Completed  . INFLUENZA VACCINE  Completed  . Hepatitis C Screening  Completed  . HIV Screening  Completed   Immunization History  Administered Date(s) Administered  . Influenza,inj,Quad PF,6+ Mos 04/20/2016, 03/23/2017  . Influenza-Unspecified 03/31/2015  . Pneumococcal Polysaccharide-23 11/25/2010, 04/20/2016  . Tdap 04/20/2016   Patient Active Problem List   Diagnosis Date Noted  . Diabetic leg ulcer ([place]) 07/18/2013    Priority: High  . 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 Morbid obesity with BMI of 50.0-59.9, adult ([place]) 09/29/2006    Priority: High  . Hyperlipidemia LDL goal <70 01/16/2009    Priority: Medium  . TOBACCO ABUSE 07/27/2007    Priority: Medium  . Essential hypertension 09/29/2006    Priority:  Medium  . Circulation disorder of lower extremity 05/31/2015  . BPH (benign prostatic hyperplasia) 12/24/2014  . IBS (irritable bowel syndrome) 12/24/2014  . 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 Restless legs syndrome (RLS) 11/20/2012  . Generalized anxiety disorder   . CALCULUS, KIDNEY 05/30/2002   Past Medical History:  Diagnosis Date  . Angio-edema   . Anxiety   . Depression   . Diabetes mellitus type II    type 2  . GERD (gastroesophageal reflux disease)   . Headache    migraine  . History of kidney stones   . HLD (hyperlipidemia)   . HTN (hypertension)   . Morbid obesity ([place])   . Restless legs syndrome (RLS) 11/20/2012  . Snores   . Tobacco abuse   . Ulcer of left ankle (HCC)    last 2 months dime size dry dressing changing q day  . Urticaria   . Wears glasses    Past Surgical History:  Procedure Laterality Date  . Arm surgery Right 1969   Abscess excision and debridement at elbow  . COLONOSCOPY WITH PROPOFOL N/A 09/06/2016   Procedure: COLONOSCOPY WITH PROPOFOL;  Surgeon: Doran Stabler, MD;  Location: [facility];  Service: Gastroenterology;  Laterality: N/A;  . I&D EXTREMITY Left 05/29/2014   Procedure: IRRIGATION AND DEBRIDEMENT OF LEFT LEG WOUND AND PLACEMENT OF  INTEGRA  AND  VAC;  Surgeon: Theodoro Kos, DO;  Location: [facility];  Service: Plastics;  Laterality: Left;  . INCISION AND DRAINAGE  OF WOUND Left 04/23/2014   Procedure: IRRIGATION AND DEBRIDEMENT LOWER LEFT LEG WOUND WITH PLACEMENT OF INTEGRA AND VAC;  Surgeon: Theodoro Kos, DO;  Location: [facility];  Service: Plastics;  Laterality: Left;  . kidney stone removal  2004  . MINOR APPLICATION OF WOUND VAC Left 04/23/2014   Procedure: MINOR APPLICATION OF WOUND VAC;  Surgeon: Theodoro Kos, DO;  Location: [facility];  Service: Plastics;  Laterality: Left;  . SKIN SPLIT GRAFT Left 06/18/2014   Procedure: SKIN GRAFT SPLIT  THICKNESS TO LOWER LEFT LEG WOUND WITH PLACEMENT OF VAC;  Surgeon: Theodoro Kos, DO;  Location: [facility];  Service: Plastics;  Laterality: Left;  Marland Kitchen VASECTOMY  1994   Social History   Socioeconomic History  . Marital status: Married    Spouse name: Not on file  . Number of children: 3  . Years of education: Not on file  . Highest education level: Not on file  Social Needs  . Financial resource strain: Not on file  . Food insecurity - worry: Not on file  . Food insecurity - inability: Not on file  . Transportation needs - medical: Not on file  . Transportation needs - non-medical: Not on file  Occupational History  . 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 Smoking status: Current Every Day Smoker    Packs/day: 0.25    Years: 40.00    Pack years: 10.00    Types: Cigarettes  . Smokeless tobacco: Never Used  . Tobacco comment: one cigarette daily  Substance and Sexual Activity  . Alcohol use: Yes    Alcohol/week: 0.0 oz    Comment: rarely  . Drug use: No  . Sexual activity: Yes    Partners: Female  Other Topics Concern  . Not on file  Social History Narrative  . Not on file   Family History  Problem Relation Age of Onset  . Stroke Mother        multiple  . Leukemia Mother   . Pneumonia Mother   . Cancer Mother   . Diabetes Mother   . Diabetes Father   . Heart disease Father   . Hyperlipidemia Father   . Hypertension Father   . Allergic rhinitis Neg Hx   . Angioedema Neg Hx   . Asthma Neg Hx   . Eczema Neg Hx   . Immunodeficiency Neg Hx   . Urticaria Neg Hx    Allergies  Allergen Reactions  . Cashew Nut Oil Anaphylaxis    Cashews, 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  Marland Kitchen Peanut Oil Anaphylaxis  . Pistachio Nut (Diagnostic) Hives, Itching, Rash, Shortness Of Breath and Swelling  . Betadine [Povidone Iodine]     Raised rash 04/23/14  . Latex Dermatitis  . Penicillins     REACTION: rash all over as child  . Lidocaine Other (See  Comments)    Minimal effect with lidocaine, prefers different anesthetic    Medication list has been reviewed and updated.   General: Denies fever, chills, sweats. No significant weight loss. Eyes: Denies blurring,significant itching ENT: Denies earache, sore throat, and hoarseness. Cardiovascular: Denies chest pains, palpitations, dyspnea on exertion Respiratory: Denies cough, dyspnea at rest,wheeezing Breast: no concerns about lumps GI: Denies nausea, vomiting, diarrhea, constipation, change in bowel habits, abdominal pain, melena, hematochezia GU: Denies penile discharge, ED, urinary flow / outflow problems. No STD concerns. Musculoskeletal: Denies back pain, joint pain Derm: as above Neuro: Denies  paresthesias, frequent falls, frequent headaches  Psych: Denies depression, anxiety Endocrine: Denies cold intolerance, heat intolerance, polydipsia Heme: Denies enlarged lymph nodes Allergy: No hayfever  Objective:   BP 130/62   Pulse 94   Temp 98.4 F (36.9 C) (Oral)   Ht 5' 10.25" (1.784 m)   Wt (!) 370 lb 8 oz (168.1 kg)   BMI 52.78 kg/m  Ideal Body Weight: Weight in (lb) to have BMI = 25: 175.1  No exam data present  GEN: well developed, well nourished, no acute distress Eyes: conjunctiva and lids normal, PERRLA, EOMI ENT: TM clear, nares clear, oral exam WNL Neck: supple, no lymphadenopathy, no thyromegaly, no JVD Pulm: clear to auscultation and percussion, respiratory effort normal CV: regular rate and rhythm, S1-S2, no murmur, rub or gallop, no bruits, peripheral pulses normal and symmetric, no cyanosis, clubbing, edema or varicosities GI: soft, non-tender; no hepatosplenomegaly, masses; active bowel sounds all quadrants GU: no hernia, testicular mass, penile discharge Lymph: no cervical, axillary or inguinal adenopathy MSK: gait normal, muscle tone and strength WNL, no joint swelling, effusions, discoloration, crepitus  SKIN: clear, good turgor, color WNL, no  rashes, lesions, or ulcerations Neuro: normal mental status, normal strength, sensation, and motion Psych: alert; oriented to person, place and time, normally interactive and not anxious or depressed in appearance. All labs reviewed with patient.  Lipids:    Component Value Date/Time   CHOL 143 06/26/2017 1001   TRIG 260 (H) 06/26/2017 1001   HDL 33 (L) 06/26/2017 1001   LDLDIRECT 76 02/19/2014 1635   CHOLHDL 4.3 06/26/2017 1001   CBC: CBC Latest Ref Rng & Units 06/26/2017 04/08/2016 12/24/2014  WBC 3.4 - 10.8 x10E3/uL 7.9 8.1 10.5  Hemoglobin 13.0 - 17.7 g/dL 15.1 16.2 16.2  Hematocrit 37.5 - 51.0 % 43.4 46.6 48.0  Platelets 150 - 379 x10E3/uL 229 187 496    Basic Metabolic Panel:    Component Value Date/Time   NA 143 06/26/2017 1001   K 4.5 06/26/2017 1001   CL 101 06/26/2017 1001   CO2 26 06/26/2017 1001   BUN 24 06/26/2017 1001   CREATININE 1.07 06/26/2017 1001   GLUCOSE 162 (H) 06/26/2017 1001   GLUCOSE 158 (H) 06/18/2014 0923   CALCIUM 10.3 (H) 06/26/2017 1001   Hepatic Function Latest Ref Rng & Units 06/26/2017 04/08/2016 06/19/2015  Total Protein 6.0 - 8.5 g/dL 6.6 6.6 6.4  Albumin 3.5 - 5.5 g/dL 4.4 4.4 4.3  AST 0 - 40 IU/L '12 16 18  '$ ALT 0 - 44 IU/L 21 26 32  Alk Phosphatase 39 - 117 IU/L 41 44 44  Total Bilirubin 0.0 - 1.2 mg/dL 0.3 0.3 0.3  Bilirubin, Direct 0.00 - 0.40 mg/dL 0.08 0.13 0.11    Lab Results  Component Value Date   TSH 1.022 07/27/2007   Lab Results  Component Value Date   PSA 0.3 02/19/2014   PSA 0.2 11/15/2012   PSA 0.3 04/09/2010   Lab Results  Component Value Date   HGBA1C 7.9 (H) 06/26/2017    Assessment and Plan:   Healthcare maintenance - Plan: Basic metabolic panel, CBC with Differential/Platelet, Hepatic function panel, Hemoglobin A1c, Lipid panel, PSA  Has been successful with weight loss and diet changes.  F/u 6 months  Health Maintenance Exam: The patient's preventative maintenance and recommended screening tests for  an annual wellness exam were reviewed in full today. Brought up to date unless services declined.  Counselled on the importance of diet, exercise, and its role in overall health and mortality. The patient's  FH and SH was reviewed, including their home life, tobacco status, and drug and alcohol status.  Follow-up in 1 year for physical exam or additional follow-up below.  Follow-up: No Follow-up on file. Or follow-up in 1 year if not noted.  Orders Placed This Encounter  Procedures  . Basic metabolic panel  . CBC with Differential/Platelet  . Hepatic function panel  . Hemoglobin A1c  . Lipid panel  . PSA    Signed,  Frederico Hamman T. Kourtnei Rauber, MD   Allergies as of 06/26/2017      Reactions   Cashew Nut Oil Anaphylaxis   Cashews, 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   Peanut Oil Anaphylaxis   Pistachio Nut (diagnostic) Hives, Itching, Rash, Shortness Of Breath, Swelling   Betadine [povidone Iodine]    Raised rash 04/23/14   Latex Dermatitis   Penicillins    REACTION: rash all over as child   Lidocaine Other (See Comments)   Minimal effect with lidocaine, prefers different anesthetic      Medication List        Accurate as of 06/26/17 11:59 PM. Always use your most recent med list.          EPINEPHrine 0.3 mg/0.3 mL Soaj injection Commonly known as:  EPI-PEN   glipiZIDE 10 MG 24 hr tablet Commonly known as:  GLUCOTROL XL Take 2 tablets (20 mg total) by mouth daily with breakfast.   lisinopril-hydrochlorothiazide 20-12.5 MG tablet Commonly known as:  PRINZIDE,ZESTORETIC Take 2 tablets by mouth daily.   meloxicam 15 MG tablet Commonly known as:  MOBIC TAKE 15 MG BY MOUTH EACH MORNING   metFORMIN 500 MG 24 hr tablet Commonly known as:  GLUCOPHAGE-XR TAKE 4 TABLETS DAILY WITH  BREAKFAST   multivitamin tablet Take 1 tablet by mouth daily.   ONETOUCH DELICA LANCETS 59D Misc 1 each 2 (two) times daily by Does not apply route. E11.622/L97.929   [place]  w/Device Kit 1 each by Does not apply route 2 (two) times daily. Dx: E11.622/L97.929   ONETOUCH VERIO test strip Generic drug:  glucose blood USE TO CHECK BLOOD SUGAR   TWO TIMES A DAY   oxyCODONE-acetaminophen 10-325 MG tablet Commonly known as:  PERCOCET Take 1 tablet by mouth every 4 (four) hours as needed.   pantoprazole 40 MG tablet Commonly known as:  PROTONIX Take 1 tablet (40 mg total) by mouth daily.   PARoxetine 30 MG tablet Commonly known as:  PAXIL Take 1 tablet (30 mg total) by mouth every morning.   pentoxifylline 400 MG CR tablet Commonly known as:  TRENTAL TAKE 1 TABLET 3 TIMES DAILYWITH MEALS   pioglitazone 45 MG tablet Commonly known as:  ACTOS Take 1 tablet (45 mg total) by mouth daily.   rOPINIRole 0.5 MG tablet Commonly known as:  REQUIP Take 1 tablet (0.5 mg total) by mouth 2 (two) times daily.   sildenafil 20 MG tablet Commonly known as:  REVATIO Generic Revatio / Sildanefil 20 mg. 2 - 5 tabs 30 mins prior to intercourse.   Make sure he understands this is cash / credit card, not to be run through insurance.   simvastatin 40 MG tablet Commonly known as:  ZOCOR Take 1 tablet (40 mg total) by mouth at bedtime.   vitamin C 500 MG tablet Commonly known as:  ASCORBIC ACID Take 500 mg by mouth daily.

## 2017-06-27 ENCOUNTER — Encounter: Payer: Self-pay | Admitting: Family Medicine

## 2017-06-27 LAB — CBC WITH DIFFERENTIAL/PLATELET
BASOS ABS: 0 10*3/uL (ref 0.0–0.2)
Basos: 1 %
EOS (ABSOLUTE): 0.2 10*3/uL (ref 0.0–0.4)
Eos: 2 %
HEMOGLOBIN: 15.1 g/dL (ref 13.0–17.7)
Hematocrit: 43.4 % (ref 37.5–51.0)
Immature Grans (Abs): 0 10*3/uL (ref 0.0–0.1)
Immature Granulocytes: 0 %
LYMPHS: 25 %
Lymphocytes Absolute: 2 10*3/uL (ref 0.7–3.1)
MCH: 29.4 pg (ref 26.6–33.0)
MCHC: 34.8 g/dL (ref 31.5–35.7)
MCV: 84 fL (ref 79–97)
MONOCYTES: 9 %
Monocytes Absolute: 0.7 10*3/uL (ref 0.1–0.9)
Neutrophils Absolute: 5 10*3/uL (ref 1.4–7.0)
Neutrophils: 63 %
Platelets: 229 10*3/uL (ref 150–379)
RBC: 5.14 x10E6/uL (ref 4.14–5.80)
RDW: 14.3 % (ref 12.3–15.4)
WBC: 7.9 10*3/uL (ref 3.4–10.8)

## 2017-06-27 LAB — LIPID PANEL
Chol/HDL Ratio: 4.3 ratio (ref 0.0–5.0)
Cholesterol, Total: 143 mg/dL (ref 100–199)
HDL: 33 mg/dL — AB (ref >39)
LDL Calculated: 58 mg/dL (ref 0–99)
TRIGLYCERIDES: 260 mg/dL — AB (ref 0–149)
VLDL CHOLESTEROL CAL: 52 mg/dL — AB (ref 5–40)

## 2017-06-27 LAB — BASIC METABOLIC PANEL
BUN/Creatinine Ratio: 22 — ABNORMAL HIGH (ref 9–20)
BUN: 24 mg/dL (ref 6–24)
CHLORIDE: 101 mmol/L (ref 96–106)
CO2: 26 mmol/L (ref 20–29)
Calcium: 10.3 mg/dL — ABNORMAL HIGH (ref 8.7–10.2)
Creatinine, Ser: 1.07 mg/dL (ref 0.76–1.27)
GFR calc Af Amer: 89 mL/min/{1.73_m2} (ref >59)
GFR calc non Af Amer: 77 mL/min/{1.73_m2} (ref >59)
GLUCOSE: 162 mg/dL — AB (ref 65–99)
Potassium: 4.5 mmol/L (ref 3.5–5.2)
Sodium: 143 mmol/L (ref 134–144)

## 2017-06-27 LAB — HEPATIC FUNCTION PANEL
ALBUMIN: 4.4 g/dL (ref 3.5–5.5)
ALK PHOS: 41 IU/L (ref 39–117)
ALT: 21 IU/L (ref 0–44)
AST: 12 IU/L (ref 0–40)
Bilirubin Total: 0.3 mg/dL (ref 0.0–1.2)
Bilirubin, Direct: 0.08 mg/dL (ref 0.00–0.40)
TOTAL PROTEIN: 6.6 g/dL (ref 6.0–8.5)

## 2017-06-27 LAB — PSA: PROSTATE SPECIFIC AG, SERUM: 0.4 ng/mL (ref 0.0–4.0)

## 2017-06-27 LAB — HEMOGLOBIN A1C
ESTIMATED AVERAGE GLUCOSE: 180 mg/dL
Hgb A1c MFr Bld: 7.9 % — ABNORMAL HIGH (ref 4.8–5.6)

## 2017-08-05 ENCOUNTER — Encounter: Payer: Self-pay | Admitting: Family Medicine

## 2017-08-07 MED ORDER — EPINEPHRINE 0.3 MG/0.3ML IJ SOAJ
0.3000 mg | Freq: Once | INTRAMUSCULAR | 0 refills | Status: DC
Start: 2017-08-07 — End: 2017-08-07

## 2018-01-04 LAB — LIPID PANEL
CHOLESTEROL: 159 (ref 0–200)
HDL: 30 — AB (ref 35–70)
TRIGLYCERIDES: 482 — AB (ref 40–160)

## 2018-01-04 LAB — BASIC METABOLIC PANEL
Creatinine: 1.1 (ref 0.6–1.3)
Glucose: 147

## 2018-01-05 LAB — HEMOGLOBIN A1C: Hgb A1c MFr Bld: 8.3 — AB (ref 4.0–6.0)

## 2018-02-17 ENCOUNTER — Encounter: Payer: Self-pay | Admitting: Family Medicine

## 2018-02-19 MED ORDER — SILDENAFIL CITRATE 20 MG PO TABS: ORAL_TABLET | ORAL | 5 refills | Status: DC

## 2018-02-19 NOTE — Telephone Encounter (Signed)
Rx faxed to [facility] at [phone].

## 2018-03-11 NOTE — Progress Notes (Deleted)
Dr. Karleen Hampshire T. Markesha Hannig, MD, [facility] [address] [place], [address] Phone: [phone] Fax: [phone]  03/12/2018  Patient: [name] [name], MRN: [id number], DOB: 09/11/1960, 57 y.o.  Primary Physician:  Hannah Beat, MD   No chief complaint on file.  Subjective:   [name] [name] is a 57 y.o. very pleasant male patient who presents with the following:  Very nice guy with DM, HTN, lipids, and obesity.   Past Medical History, Surgical History, Social History, Family History, Problem List, Medications, and Allergies have been reviewed and updated if relevant.  Patient Active Problem List   Diagnosis Date Noted  . Diabetic leg ulcer (HCC) 07/18/2013    Priority: High  . 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 Morbid obesity with BMI of 50.0-59.9, adult ([facility]) 09/29/2006    Priority: High  . Hyperlipidemia LDL goal <70 01/16/2009    Priority: Medium  . TOBACCO ABUSE 07/27/2007    Priority: Medium  . Essential hypertension 09/29/2006    Priority: Medium  . Circulation disorder of lower extremity 05/31/2015  . BPH (benign prostatic hyperplasia) 12/24/2014  . IBS (irritable bowel syndrome) 12/24/2014  . 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 Restless legs syndrome (RLS) 11/20/2012  . Generalized anxiety disorder   . CALCULUS, KIDNEY 05/30/2002    Past Medical History:  Diagnosis Date  . Angio-edema   . Anxiety   . Depression   . Diabetes mellitus type II    type 2  . GERD (gastroesophageal reflux disease)   . Headache    migraine  . History of kidney stones   . HLD (hyperlipidemia)   . HTN (hypertension)   . Morbid obesity ([facility]CC)   . Restless legs syndrome (RLS) 11/20/2012  . Tobacco abuse   . Ulcer of left ankle (HCC)    last 2 months dime size dry dressing changing q day  . Urticaria     Past Surgical History:    Procedure Laterality Date  . Arm surgery Right 1969   Abscess excision and debridement at elbow  . COLONOSCOPY WITH PROPOFOL N/A 09/06/2016   Procedure: COLONOSCOPY WITH PROPOFOL;  Surgeon: Sherrilyn Rist, MD;  Location: [facility];  Service: Gastroenterology;  Laterality: N/A;  . I&D EXTREMITY Left 05/29/2014   Procedure: IRRIGATION AND DEBRIDEMENT OF LEFT LEG WOUND AND PLACEMENT OF  INTEGRA  AND  VAC;  Surgeon: Wayland Denis, DO;  Location: [facility];  Service: Plastics;  Laterality: Left;  . INCISION AND DRAINAGE OF WOUND Left 04/23/2014   Procedure: IRRIGATION AND DEBRIDEMENT LOWER LEFT LEG WOUND WITH PLACEMENT OF INTEGRA AND VAC;  Surgeon: Wayland Denis, DO;  Location: [facility];  Service: Plastics;  Laterality: Left;  . kidney stone removal  2004  . MINOR APPLICATION OF WOUND VAC Left 04/23/2014   Procedure: MINOR APPLICATION OF WOUND VAC;  Surgeon: Wayland Denis, DO;  Location: [facility];  Service: Plastics;  Laterality: Left;  . SKIN SPLIT GRAFT Left 06/18/2014   Procedure: SKIN GRAFT SPLIT THICKNESS TO LOWER LEFT LEG WOUND WITH PLACEMENT OF VAC;  Surgeon: Wayland Denis, DO;  Location: [facility];  Service: Plastics;  Laterality: Left;  Marland Kitchen VASECTOMY  1994    Social History   Socioeconomic History  . Marital status: Married    Spouse name: Not on file  .  Number of children: 3  . Years of education: Not on file  . Highest education level: Not on file  Occupational History  . 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 Financial resource strain: Not on file  . Food insecurity:    Worry: Not on file    Inability: Not on file  . Transportation needs:    Medical: Not on file    Non-medical: Not on file  Tobacco Use  . Smoking status: Current Every Day Smoker    Packs/day: 0.25    Years: 40.00    Pack years: 10.00    Types: Cigarettes  . Smokeless tobacco: Never Used  . Tobacco comment:  one cigarette daily  Substance and Sexual Activity  . Alcohol use: Yes    Alcohol/week: 0.0 standard drinks    Comment: rarely  . Drug use: No  . Sexual activity: Yes    Partners: Female  Lifestyle  . Physical activity:    Days per week: Not on file    Minutes per session: Not on file  . Stress: Not on file  Relationships  . Social connections:    Talks on phone: Not on file    Gets together: Not on file    Attends religious service: Not on file    Active member of club or organization: Not on file    Attends meetings of clubs or organizations: Not on file    Relationship status: Not on file  . Intimate partner violence:    Fear of current or ex partner: Not on file    Emotionally abused: Not on file    Physically abused: Not on file    Forced sexual activity: Not on file  Other Topics Concern  . Not on file  Social History Narrative  . Not on file    Family History  Problem Relation Age of Onset  . Stroke Mother        multiple  . Leukemia Mother   . Pneumonia Mother   . Cancer Mother   . Diabetes Mother   . Diabetes Father   . Heart disease Father   . Hyperlipidemia Father   . Hypertension Father   . Allergic rhinitis Neg Hx   . Angioedema Neg Hx   . Asthma Neg Hx   . Eczema Neg Hx   . Immunodeficiency Neg Hx   . Urticaria Neg Hx     Allergies  Allergen Reactions  . Cashew Nut Oil Anaphylaxis    Cashews, 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  Marland Kitchen Peanut Oil Anaphylaxis  . Pistachio Nut (Diagnostic) Hives, Itching, Rash, Shortness Of Breath and Swelling  . Betadine [Povidone Iodine]     Raised rash 04/23/14  . Latex Dermatitis  . Penicillins     REACTION: rash all over as child  . Lidocaine Other (See Comments)    Minimal effect with lidocaine, prefers different anesthetic    Medication list reviewed and updated in full in [facility].   GEN: No acute illnesses, no fevers, chills. GI: No n/v/d, eating normally Pulm: No SOB Interactive and getting along  well at home.  Otherwise, ROS is as per the HPI.  Objective:   There were no vitals taken for this visit.  GEN: WDWN, NAD, Non-toxic, A & O x 3 HEENT: Atraumatic, Normocephalic. Neck supple. No masses, No LAD. Ears and Nose: No external deformity. CV: RRR, No M/G/R. No JVD. No thrill. No extra heart sounds. PULM: CTA B, no wheezes, crackles, rhonchi.  No retractions. No resp. distress. No accessory muscle use. EXTR: No c/c/e NEURO Normal gait.  PSYCH: Normally interactive. Conversant. Not depressed or anxious appearing.  Calm demeanor.   Laboratory and Imaging Data:  Assessment and Plan:   ***

## 2018-03-12 ENCOUNTER — Ambulatory Visit: Payer: Managed Care, Other (non HMO) | Admitting: Family Medicine

## 2018-03-17 ENCOUNTER — Other Ambulatory Visit: Payer: Self-pay | Admitting: Family Medicine

## 2018-03-19 ENCOUNTER — Ambulatory Visit (INDEPENDENT_AMBULATORY_CARE_PROVIDER_SITE_OTHER): Payer: Managed Care, Other (non HMO) | Admitting: Family Medicine

## 2018-03-19 ENCOUNTER — Ambulatory Visit: Payer: Managed Care, Other (non HMO) | Admitting: Family Medicine

## 2018-03-19 ENCOUNTER — Encounter: Payer: Self-pay | Admitting: Family Medicine

## 2018-03-19 VITALS — BP 120/74 | HR 93 | Temp 98.5°F | Ht 71.0 in | Wt 378.0 lb

## 2018-03-19 DIAGNOSIS — L97929 Non-pressure chronic ulcer of unspecified part of left lower leg with unspecified severity: Secondary | ICD-10-CM | POA: Diagnosis not present

## 2018-03-19 DIAGNOSIS — F17200 Nicotine dependence, unspecified, uncomplicated: Secondary | ICD-10-CM | POA: Diagnosis not present

## 2018-03-19 DIAGNOSIS — E11622 Type 2 diabetes mellitus with other skin ulcer: Secondary | ICD-10-CM

## 2018-03-19 DIAGNOSIS — Z6841 Body Mass Index (BMI) 40.0 and over, adult: Secondary | ICD-10-CM

## 2018-03-19 DIAGNOSIS — Z23 Encounter for immunization: Secondary | ICD-10-CM

## 2018-03-19 MED ORDER — VARENICLINE TARTRATE 0.5 MG X 11 & 1 MG X 42 PO MISC: ORAL | 0 refills | Status: DC

## 2018-03-19 MED ORDER — VARENICLINE TARTRATE 1 MG PO TABS: 1.0000 mg | ORAL_TABLET | Freq: Two times a day (BID) | ORAL | 3 refills | Status: DC

## 2018-03-19 MED ORDER — EPINEPHRINE 0.3 MG/0.3ML IJ SOAJ: 0.3000 mg | Freq: Once | INTRAMUSCULAR | 0 refills | Status: DC

## 2018-03-19 MED ORDER — LIRAGLUTIDE 18 MG/3ML ~~LOC~~ SOPN: 0.6000 mg | PEN_INJECTOR | Freq: Every day | SUBCUTANEOUS | 0 refills | Status: DC

## 2018-03-19 MED ORDER — LIRAGLUTIDE 18 MG/3ML ~~LOC~~ SOPN: 0.6000 mg | PEN_INJECTOR | Freq: Every day | SUBCUTANEOUS | 11 refills | Status: DC

## 2018-03-19 NOTE — Progress Notes (Signed)
   Subjective:    Patient ID: [name] [name], male    DOB: Mar 22, 1961, 57 y.o.   MRN: [id number]  HPI  57 year old male pt of Dr, Copland's presentf for appt to discuss obesity and tobacco abuse.  1. morbid obesity: Body mass index is 52.72 kg/m. Wt Readings from Last 3 Encounters:  03/19/18 (!) 378 lb (171.5 kg)  06/26/17 (!) 370 lb 8 oz (168.1 kg)  03/23/17 (!) 380 lb 12 oz (172.7 kg)   Comorbidities: HTN, DM Lab Results  Component Value Date   HGBA1C 8.3 (A) 01/05/2018    Eats a lot of carbs.[place] foods and carbohydrates. No exercise.  2. Tobacco use: he has not tried medication in past.  He is open  And ready to quit smoking.  Smokes in house and car. 1/2 pack per day... Weekends 1 pack per day.  Social History /Family History/Past Medical History reviewed in detail and updated in EMR if needed. Blood pressure 120/74, pulse 93, temperature 98.5 F (36.9 C), temperature source Oral, height 5\' 11"  (1.803 m), weight (!) 378 lb (171.5 kg).  Review of Systems  Constitutional: Negative for fatigue and fever.  HENT: Negative for ear pain.   Eyes: Negative for pain.  Respiratory: Negative for cough and shortness of breath.   Cardiovascular: Negative for chest pain, palpitations and leg swelling.  Gastrointestinal: Negative for abdominal pain.  Genitourinary: Negative for dysuria.  Musculoskeletal: Negative for arthralgias.  Neurological: Negative for syncope, light-headedness and headaches.  Psychiatric/Behavioral: Negative for dysphoric mood.       Objective:   Physical Exam  Constitutional: Vital signs are normal. He appears well-developed and well-nourished.  obesity  HENT:  Head: Normocephalic.  Right Ear: Hearing normal.  Left Ear: Hearing normal.  Nose: Nose normal.  Mouth/Throat: Oropharynx is clear and moist and mucous membranes are normal.  Neck: Trachea normal. Carotid bruit is not present. No thyroid mass and no thyromegaly present.  Cardiovascular:  Normal rate, regular rhythm and normal pulses. Exam reveals no gallop, no distant heart sounds and no friction rub.  No murmur heard. No peripheral edema  Pulmonary/Chest: Effort normal and breath sounds normal. No respiratory distress.  Skin: Skin is warm, dry and intact. No rash noted.  Psychiatric: He has a normal mood and affect. His speech is normal and behavior is normal. Thought content normal.          Assessment & Plan:

## 2018-03-19 NOTE — Assessment & Plan Note (Signed)
Poor control. Start liraglutide for DM and weight loss. Close follow up in 2 weeks.

## 2018-03-19 NOTE — Assessment & Plan Note (Signed)
Encouraged exercise, weight loss, healthy eating habits. Start low carb diet, start exercise and increase as able.

## 2018-03-19 NOTE — Assessment & Plan Note (Signed)
Now ready to quit smoking  Smoking cessation instruction/counseling given:  counseled patient on the dangers of tobacco use, advised patient to stop smoking, and reviewed strategies to maximize success   Start chantix.. Wife quitting with him as well.

## 2018-03-19 NOTE — Patient Instructions (Addendum)
Decrease carbs in diet, decreased fried foods, increase water.  Start walking 3 times a week... gradually increase as able.   Quti smoking.. Increase chantix as directed. Start low dose liraglutide for weight loss and DM.

## 2018-03-20 MED ORDER — LIRAGLUTIDE 18 MG/3ML ~~LOC~~ SOPN: 0.6000 mg | PEN_INJECTOR | Freq: Every day | SUBCUTANEOUS | 3 refills | Status: DC

## 2018-03-20 NOTE — Addendum Note (Signed)
Addended by: Damita Lack on: 03/20/2018 12:07 PM   Modules accepted: Orders

## 2018-03-23 ENCOUNTER — Other Ambulatory Visit: Payer: Self-pay | Admitting: *Deleted

## 2018-03-23 MED ORDER — INSULIN PEN NEEDLE 31G X 8 MM MISC: 3 refills | Status: DC

## 2018-04-02 ENCOUNTER — Other Ambulatory Visit: Payer: Self-pay | Admitting: Family Medicine

## 2018-04-03 ENCOUNTER — Telehealth: Payer: Self-pay | Admitting: Family Medicine

## 2018-04-03 ENCOUNTER — Encounter: Payer: Self-pay | Admitting: Family Medicine

## 2018-04-03 ENCOUNTER — Ambulatory Visit (INDEPENDENT_AMBULATORY_CARE_PROVIDER_SITE_OTHER): Payer: Managed Care, Other (non HMO) | Admitting: Family Medicine

## 2018-04-03 VITALS — BP 103/62 | HR 89 | Temp 97.7°F | Ht 71.0 in | Wt 375.5 lb

## 2018-04-03 DIAGNOSIS — L97929 Non-pressure chronic ulcer of unspecified part of left lower leg with unspecified severity: Secondary | ICD-10-CM

## 2018-04-03 DIAGNOSIS — E11622 Type 2 diabetes mellitus with other skin ulcer: Secondary | ICD-10-CM

## 2018-04-03 DIAGNOSIS — Z6841 Body Mass Index (BMI) 40.0 and over, adult: Secondary | ICD-10-CM

## 2018-04-03 NOTE — Telephone Encounter (Signed)
Last office visit 03/19/2018 with Dr. Ermalene Searing for DM.  Last refilled 06/11/2017 for #90 with 3 refills to [facility].  Next Appt: today with Dr. Ermalene Searing.  Ok to refill?

## 2018-04-03 NOTE — Assessment & Plan Note (Signed)
Continue current meds including  victoza at 0.6 mg daily. Hold ion increase given getting used to way he feels with lower CBGs. At follow up with PCP in 3 month. Consider increase in victoza to 1.2 mg daily if A1C not yet at goal.

## 2018-04-03 NOTE — Assessment & Plan Note (Signed)
3 lb weight loss in last 2 weeks with addition of victoza. Eating better overall. Add exerciae.

## 2018-04-03 NOTE — Progress Notes (Signed)
   Subjective:    Patient ID: [name] [name], male    DOB: 12-03-1960, 57 y.o.   MRN: [id number]  HPI   57 year old male presents for 2 week follow up on victoza start for DM and weight loss.  Diabetes:    He has noted mild headhace, mild nausea ( improved)... He is much less hungry. He is having lower blodd sugars that n he has in years.. Getting used to this. Lab Results  Component Value Date   HGBA1C 8.3 (A) 01/05/2018   Blood Sugars averaging:  FBS 145-190... Down from 270.   Wt Readings from Last 3 Encounters:  04/03/18 (!) 375 lb 8 oz (170.3 kg)  03/19/18 (!) 378 lb (171.5 kg)  06/26/17 (!) 370 lb 8 oz (168.1 kg)     Blood pressure 103/62, pulse 89, temperature 97.7 F (36.5 C), temperature source Oral, height 5\' 11"  (1.803 m), weight (!) 375 lb 8 oz (170.3 kg). Social History /Family History/Past Medical History reviewed in detail and updated in EMR if needed.   Review of Systems  Constitutional: Negative for fatigue and fever.  HENT: Negative for ear pain.   Eyes: Negative for pain.  Respiratory: Negative for cough and shortness of breath.   Cardiovascular: Negative for chest pain, palpitations and leg swelling.  Gastrointestinal: Negative for abdominal pain.  Genitourinary: Negative for dysuria.  Musculoskeletal: Negative for arthralgias.  Neurological: Negative for syncope, light-headedness and headaches.  Psychiatric/Behavioral: Negative for dysphoric mood.       Objective:   Physical Exam  Constitutional: Vital signs are normal. He appears well-developed and well-nourished.  obesity  HENT:  Head: Normocephalic.  Right Ear: Hearing normal.  Left Ear: Hearing normal.  Nose: Nose normal.  Mouth/Throat: Oropharynx is clear and moist and mucous membranes are normal.  Neck: Trachea normal. Carotid bruit is not present. No thyroid mass and no thyromegaly present.  Cardiovascular: Normal rate, regular rhythm and normal pulses. Exam reveals no gallop, no  distant heart sounds and no friction rub.  No murmur heard. No peripheral edema  Pulmonary/Chest: Effort normal and breath sounds normal. No respiratory distress.  Skin: Skin is warm, dry and intact. No rash noted.  Psychiatric: He has a normal mood and affect. His speech is normal and behavior is normal. Thought content normal.          Assessment & Plan:

## 2018-04-03 NOTE — Patient Instructions (Signed)
Keep up the  great work on healthy eating and weight loss.  Continue victoza at current dose.  Quit smoking in next week with chantix as planned.

## 2018-04-03 NOTE — Telephone Encounter (Signed)
Patient is coming in for CPX on 06/28/18. Patient works for [employer]. Please put orders in computer for [employer] and patient will have labs done the week before his appointment.

## 2018-04-09 ENCOUNTER — Encounter: Payer: Self-pay | Admitting: Family Medicine

## 2018-04-09 MED ORDER — LIRAGLUTIDE 18 MG/3ML ~~LOC~~ SOPN: 0.6000 mg | PEN_INJECTOR | Freq: Every day | SUBCUTANEOUS | 3 refills | Status: DC

## 2018-04-09 NOTE — Telephone Encounter (Signed)
Spoke with [name].  He states it is going to cost him more out of pocket to get the Victoza from [facility] than [facility].  He is requesting refills be sent to [facility] instead.  Refills sent as requested.

## 2018-04-13 ENCOUNTER — Other Ambulatory Visit: Payer: Self-pay | Admitting: Family Medicine

## 2018-04-16 NOTE — Telephone Encounter (Signed)
Electronic refill request Epinephrine Last refill 06/07/16 Last office visit 04/03/18

## 2018-05-02 ENCOUNTER — Other Ambulatory Visit: Payer: Self-pay | Admitting: Family Medicine

## 2018-05-02 DIAGNOSIS — E11622 Type 2 diabetes mellitus with other skin ulcer: Secondary | ICD-10-CM

## 2018-05-02 DIAGNOSIS — I10 Essential (primary) hypertension: Secondary | ICD-10-CM

## 2018-05-02 DIAGNOSIS — F17200 Nicotine dependence, unspecified, uncomplicated: Secondary | ICD-10-CM

## 2018-05-02 DIAGNOSIS — Z125 Encounter for screening for malignant neoplasm of prostate: Secondary | ICD-10-CM

## 2018-05-02 DIAGNOSIS — E785 Hyperlipidemia, unspecified: Secondary | ICD-10-CM

## 2018-05-02 DIAGNOSIS — L97929 Non-pressure chronic ulcer of unspecified part of left lower leg with unspecified severity: Secondary | ICD-10-CM

## 2018-05-02 NOTE — Telephone Encounter (Signed)
Can you help me do this for a future [facility] draw - they do them at a [facility] draw station  FLP, E78.5 hyperlipidemia A1c, E11.9 diabetes mellitus type 2 Urine microalbumin, E11.9 diabetes mellitus type 2 Cbc with diff, HFP, BMET: Z79.899 long term medication usage PSA, free and total: Z12.5 screening for prostate cancer

## 2018-06-04 ENCOUNTER — Encounter: Payer: Self-pay | Admitting: Family Medicine

## 2018-06-11 LAB — CBC WITH DIFFERENTIAL/PLATELET
BASOS: 1 %
Basophils Absolute: 0.1 10*3/uL (ref 0.0–0.2)
EOS (ABSOLUTE): 0.1 10*3/uL (ref 0.0–0.4)
Eos: 1 %
Hematocrit: 44.4 % (ref 37.5–51.0)
Hemoglobin: 15 g/dL (ref 13.0–17.7)
IMMATURE GRANULOCYTES: 0 %
Immature Grans (Abs): 0 10*3/uL (ref 0.0–0.1)
Lymphocytes Absolute: 2.1 10*3/uL (ref 0.7–3.1)
Lymphs: 26 %
MCH: 29.1 pg (ref 26.6–33.0)
MCHC: 33.8 g/dL (ref 31.5–35.7)
MCV: 86 fL (ref 79–97)
MONOS ABS: 0.7 10*3/uL (ref 0.1–0.9)
Monocytes: 9 %
NEUTROS ABS: 5.2 10*3/uL (ref 1.4–7.0)
NEUTROS PCT: 63 %
PLATELETS: 251 10*3/uL (ref 150–450)
RBC: 5.15 x10E6/uL (ref 4.14–5.80)
RDW: 13 % (ref 11.6–15.4)
WBC: 8.1 10*3/uL (ref 3.4–10.8)

## 2018-06-11 LAB — MICROALBUMIN / CREATININE URINE RATIO
Creatinine, Urine: 94.1 mg/dL
Microalb/Creat Ratio: 53.6 mg/g creat — ABNORMAL HIGH (ref 0.0–30.0)
Microalbumin, Urine: 50.4 ug/mL

## 2018-06-12 LAB — HEPATIC FUNCTION PANEL
ALBUMIN: 4.4 g/dL (ref 3.5–5.5)
ALT: 24 IU/L (ref 0–44)
AST: 15 IU/L (ref 0–40)
Alkaline Phosphatase: 43 IU/L (ref 39–117)
Bilirubin Total: 0.4 mg/dL (ref 0.0–1.2)
Bilirubin, Direct: 0.16 mg/dL (ref 0.00–0.40)
TOTAL PROTEIN: 6.5 g/dL (ref 6.0–8.5)

## 2018-06-12 LAB — BASIC METABOLIC PANEL
BUN/Creatinine Ratio: 13 (ref 9–20)
BUN: 14 mg/dL (ref 6–24)
CO2: 24 mmol/L (ref 20–29)
Calcium: 10.2 mg/dL (ref 8.7–10.2)
Chloride: 99 mmol/L (ref 96–106)
Creatinine, Ser: 1.05 mg/dL (ref 0.76–1.27)
GFR calc Af Amer: 91 mL/min/{1.73_m2} (ref >59)
GFR calc non Af Amer: 78 mL/min/{1.73_m2} (ref >59)
Glucose: 160 mg/dL — ABNORMAL HIGH (ref 65–99)
Potassium: 4.8 mmol/L (ref 3.5–5.2)
Sodium: 139 mmol/L (ref 134–144)

## 2018-06-12 LAB — LIPID PANEL
CHOLESTEROL TOTAL: 144 mg/dL (ref 100–199)
Chol/HDL Ratio: 4 ratio (ref 0.0–5.0)
HDL: 36 mg/dL — ABNORMAL LOW (ref >39)
LDL CALC: 63 mg/dL (ref 0–99)
Triglycerides: 225 mg/dL — ABNORMAL HIGH (ref 0–149)
VLDL Cholesterol Cal: 45 mg/dL — ABNORMAL HIGH (ref 5–40)

## 2018-06-12 LAB — PSA, TOTAL AND FREE
PSA, Free Pct: 37.5 %
PSA, Free: 0.15 ng/mL
Prostate Specific Ag, Serum: 0.4 ng/mL (ref 0.0–4.0)

## 2018-06-12 LAB — HEMOGLOBIN A1C
Est. average glucose Bld gHb Est-mCnc: 177 mg/dL
Hgb A1c MFr Bld: 7.8 % — ABNORMAL HIGH (ref 4.8–5.6)

## 2018-06-26 NOTE — Progress Notes (Signed)
Dr. Frederico Hamman T. Komal Stangelo, MD, [facility] [place], [address] Phone: [phone] Fax: [phone]  06/28/2018  Patient: [name] [name], MRN: [id number], DOB: 08-Oct-1960, 58 y.o.  Primary Physician:  Owens Loffler, MD   Chief Complaint  Patient presents with  . Annual Exam   Subjective:   [name] [name] is a 58 y.o. pleasant patient who presents with the following:  Preventative Health Maintenance Visit:  Health Maintenance Summary Reviewed and updated, unless pt declines services.  Tobacco History Reviewed. Down to 4 cigaretes.  Alcohol: No concerns, no excessive use Exercise Habits: Some activity, rec at least 30 mins 5 times a week - basically none now STD concerns: no risk or activity to increase risk Drug Use: None Encouraged self-testicular check  Diabetes Mellitus: Tolerating Medications: yes Compliance with diet: fair Exercise: minimal / intermittent Avg blood sugars at home: not checking Foot problems: none Hypoglycemia: none No nausea, vomitting, blurred vision, polyuria.  Lab Results  Component Value Date   HGBA1C 7.8 (H) 06/11/2018    Wt Readings from Last 3 Encounters:  06/28/18 (!) 371 lb 6.4 oz (168.5 kg)  04/03/18 (!) 375 lb 8 oz (170.3 kg)  03/19/18 (!) 378 lb (171.5 kg)     Has some new leg ulcers now - same area on the R side of the L leg.  On the lower extremity.  His prior leg ulcer aside from these 2 has healed.  In the past he has had extensive work done with plastic surgery, wound care from a large diabetic ulcer.  His wife is an Therapist, sports, and is doing daily wound care on him currently.  Going to eye MD next month  When going to the wound clinic - reports transitioning between wheelchairs.  Reports hurting his R shoulder and has been having some pain off and on worse with doing some christmas decorations.  He is now having some relatively minor pain with abduction and internal range  of motion.  Hands will L hand 1-3 fingers. Also has some hand neuropathy. - Trial of some gabapentin.  Numbness in his hands and legs at different points.  This is been ongoing for some time.  Is currently not on any neuropathic pain medication, this is been worsening over time.  Has some sciatica on the L side. Some nights it is really hard to take.  He has pain in the leg buttocks region, and he does have some radicular pain down the back of his leg.  He denies any true groin pain.  This bothers him when he is working and when he sitting for long period of time.  Leg cramps.  He does have some restless leg syndrome, and he also has some arterial disease in his lower legs and he also has diabetic neuropathy.  Having some burning in his legs. With walking some and will get tired some.   Skin tags.  He also has multiple diffuse skin tags, with greater than 50 around his neck.  These bother him from a cosmetic standpoint.  He also begins an encounter and discussion regarding chronic pain in general, and he questions whether he can go on some chronic opioid medication.  In the past when he was having large-scale diabetic ulcers, he is required either Norco or Percocet, and he questions whether or not he can go on this now.  He does have some pain at times with his diabetic ulcer.  Tylenol #3 for chronic  pain Gabapentin  Lab Results  Component Value Date   HGBA1C 7.8 (H) 06/11/2018   HGBA1C 8.3 (A) 01/05/2018   HGBA1C 7.9 (H) 06/26/2017   Lab Results  Component Value Date   MICROALBUR 2.82 (H) 07/27/2007   LDLCALC 63 06/11/2018   CREATININE 1.05 06/11/2018    Wt Readings from Last 3 Encounters:  06/28/18 (!) 371 lb 6.4 oz (168.5 kg)  04/03/18 (!) 375 lb 8 oz (170.3 kg)  03/19/18 (!) 378 lb (171.5 kg)    Body mass index is 53.67 kg/m.   HTN: Tolerating all medications without side effects Stable and at goal No CP, no sob. No HA.  BP Readings from Last 3 Encounters:  06/28/18  130/70  04/03/18 103/62  03/19/18 564/33    Basic Metabolic Panel:    Component Value Date/Time   NA 139 06/11/2018 1019   K 4.8 06/11/2018 1019   CL 99 06/11/2018 1019   CO2 24 06/11/2018 1019   BUN 14 06/11/2018 1019   CREATININE 1.05 06/11/2018 1019   GLUCOSE 160 (H) 06/11/2018 1019   GLUCOSE 158 (H) 06/18/2014 0923   CALCIUM 10.2 06/11/2018 1019    Lipids: Doing well, stable. Tolerating meds fine with no SE. Panel reviewed with patient.  Lipids:    Component Value Date/Time   CHOL 144 06/11/2018 1021   TRIG 225 (H) 06/11/2018 1021   HDL 36 (L) 06/11/2018 1021   LDLDIRECT 76 02/19/2014 1635   CHOLHDL 4.0 06/11/2018 1021    Lab Results  Component Value Date   ALT 24 06/11/2018   AST 15 06/11/2018   ALKPHOS 43 06/11/2018   BILITOT 0.4 06/11/2018     Health Maintenance  Topic Date Due  . OPHTHALMOLOGY EXAM  04/20/2017  . HEMOGLOBIN A1C  12/10/2018  . FOOT EXAM  06/29/2019  . TETANUS/TDAP  04/20/2026  . COLONOSCOPY  09/07/2026  . INFLUENZA VACCINE  Completed  . PNEUMOCOCCAL POLYSACCHARIDE VACCINE AGE 56-64 HIGH RISK  Completed  . Hepatitis C Screening  Completed  . HIV Screening  Completed   Immunization History  Administered Date(s) Administered  . Influenza,inj,Quad PF,6+ Mos 04/20/2016, 03/23/2017, 03/19/2018  . Influenza-Unspecified 03/31/2015  . Pneumococcal Polysaccharide-23 11/25/2010, 04/20/2016  . Tdap 04/20/2016   Patient Active Problem List   Diagnosis Date Noted  . Diabetic leg ulcer ([place]) 07/18/2013    Priority: High  . 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 Morbid obesity with BMI of 50.0-59.9, adult ([employer]) 09/29/2006    Priority: High  . Hyperlipidemia LDL goal <70 01/16/2009    Priority: Medium  . TOBACCO ABUSE 07/27/2007    Priority: Medium  . Essential hypertension 09/29/2006    Priority: Medium  . Diabetic polyneuropathy associated with type 2 diabetes mellitus ([place]) 06/30/2018    . Other chronic pain 06/30/2018  . Erectile dysfunction due to type 2 diabetes mellitus ([place]) 06/30/2018  . Circulation disorder of lower extremity 05/31/2015  . BPH (benign prostatic hyperplasia) 12/24/2014  . IBS (irritable bowel syndrome) 12/24/2014  . 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 Restless legs syndrome (RLS) 11/20/2012  . Generalized anxiety disorder   . CALCULUS, KIDNEY 05/30/2002   Past Medical History:  Diagnosis Date  . Angio-edema   . Anxiety   . Depression   . Diabetes mellitus type II    type 2  . GERD (gastroesophageal reflux disease)   . Headache  migraine  . History of kidney stones   . HLD (hyperlipidemia)   . HTN (hypertension)   . Morbid obesity ([place])   . Restless legs syndrome (RLS) 11/20/2012  . Tobacco abuse   . Ulcer of left ankle (HCC)    last 2 months dime size dry dressing changing q day  . Urticaria    Past Surgical History:  Procedure Laterality Date  . Arm surgery Right 1969   Abscess excision and debridement at elbow  . COLONOSCOPY WITH PROPOFOL N/A 09/06/2016   Procedure: COLONOSCOPY WITH PROPOFOL;  Surgeon: Doran Stabler, MD;  Location: [facility];  Service: Gastroenterology;  Laterality: N/A;  . I&D EXTREMITY Left 05/29/2014   Procedure: IRRIGATION AND DEBRIDEMENT OF LEFT LEG WOUND AND PLACEMENT OF  INTEGRA  AND  VAC;  Surgeon: Theodoro Kos, DO;  Location: [facility];  Service: Plastics;  Laterality: Left;  . INCISION AND DRAINAGE OF WOUND Left 04/23/2014   Procedure: IRRIGATION AND DEBRIDEMENT LOWER LEFT LEG WOUND WITH PLACEMENT OF INTEGRA AND VAC;  Surgeon: Theodoro Kos, DO;  Location: [facility];  Service: Plastics;  Laterality: Left;  . kidney stone removal  2004  . MINOR APPLICATION OF WOUND VAC Left 04/23/2014   Procedure: MINOR APPLICATION OF WOUND VAC;  Surgeon: Theodoro Kos, DO;  Location: [facility];  Service: Plastics;  Laterality: Left;   . SKIN SPLIT GRAFT Left 06/18/2014   Procedure: SKIN GRAFT SPLIT THICKNESS TO LOWER LEFT LEG WOUND WITH PLACEMENT OF VAC;  Surgeon: Theodoro Kos, DO;  Location: [facility];  Service: Plastics;  Laterality: Left;  Marland Kitchen VASECTOMY  1994   Social History   Socioeconomic History  . Marital status: Married    Spouse name: Not on file  . Number of children: 3  . Years of education: Not on file  . Highest education level: Not on file  Occupational History  . 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 Financial resource strain: Not on file  . Food insecurity:    Worry: Not on file    Inability: Not on file  . Transportation needs:    Medical: Not on file    Non-medical: Not on file  Tobacco Use  . Smoking status: Current Every Day Smoker    Packs/day: 0.25    Years: 40.00    Pack years: 10.00    Types: Cigarettes  . Smokeless tobacco: Never Used  . Tobacco comment: one cigarette daily  Substance and Sexual Activity  . Alcohol use: Yes    Alcohol/week: 0.0 standard drinks    Comment: rarely  . Drug use: No  . Sexual activity: Yes    Partners: Female  Lifestyle  . Physical activity:    Days per week: Not on file    Minutes per session: Not on file  . Stress: Not on file  Relationships  . Social connections:    Talks on phone: Not on file    Gets together: Not on file    Attends religious service: Not on file    Active member of club or organization: Not on file    Attends meetings of clubs or organizations: Not on file    Relationship status: Not on file  . Intimate partner violence:    Fear of current or ex partner: Not on file    Emotionally abused: Not on file    Physically abused: Not on file    Forced sexual activity: Not  on file  Other Topics Concern  . Not on file  Social History Narrative  . Not on file   Family History  Problem Relation Age of Onset  . Stroke Mother        multiple  . Leukemia Mother   . Pneumonia  Mother   . Cancer Mother   . Diabetes Mother   . Diabetes Father   . Heart disease Father   . Hyperlipidemia Father   . Hypertension Father   . Allergic rhinitis Neg Hx   . Angioedema Neg Hx   . Asthma Neg Hx   . Eczema Neg Hx   . Immunodeficiency Neg Hx   . Urticaria Neg Hx    Allergies  Allergen Reactions  . Cashew Nut Oil Anaphylaxis    Cashews, 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  Marland Kitchen Peanut Oil Anaphylaxis  . Pistachio Nut (Diagnostic) Hives, Itching, Rash, Shortness Of Breath and Swelling  . Betadine [Povidone Iodine]     Raised rash 04/23/14  . Latex Dermatitis  . Penicillins     REACTION: rash all over as child  . Lidocaine Other (See Comments)    Minimal effect with lidocaine, prefers different anesthetic    Medication list has been reviewed and updated.   General: Denies fever, chills, sweats. No significant weight loss. Eyes: Denies blurring,significant itching ENT: Denies earache, sore throat, and hoarseness. Cardiovascular: Denies chest pains, palpitations, dyspnea on exertion.  SOME RESTLESS LEGS SX Respiratory: Denies cough, dyspnea at rest,wheeezing Breast: no concerns about lumps GI: Denies nausea, vomiting, diarrhea, constipation, change in bowel habits, abdominal pain, melena, hematochezia GU: KNOWN ED WITH MINIMAL RESPONSE TO VIAGRA Musculoskeletal: ONGOING SCIATICA, L SHOULDER PAIN, VARIOUS COMPLAINTS Derm: Denies rash, itching. MULTIPLE SKIN TAGS Neuro: NEUROPATHY AS ABOVE Psych: SOME MINOR DEPRESSION, BUT HE FAILS STABLE OVERALL Endocrine: ONGOING DIFFUSE EXTREMITY NEUROPATHY Heme: Denies enlarged lymph nodes Allergy: No hayfever  Objective:   BP 130/70   Pulse 94   Temp (!) 97.5 F (36.4 C) (Oral)   Ht 5' 9.75" (1.772 m)   Wt (!) 371 lb 6.4 oz (168.5 kg)   SpO2 91%   BMI 53.67 kg/m  Ideal Body Weight: Weight in (lb) to have BMI = 25: 172.6  No exam data present  GEN: well developed, well nourished, no acute distress Eyes: conjunctiva and lids  normal, PERRLA, EOMI ENT: TM clear, nares clear, oral exam WNL Neck: supple, no lymphadenopathy, no thyromegaly, no JVD Pulm: clear to auscultation and percussion, respiratory effort normal CV: regular rate and rhythm, S1-S2, no murmur, rub or gallop, no bruits, peripheral pulses normal and symmetric, no cyanosis, clubbing, edema or varicosities GI: soft, non-tender; no hepatosplenomegaly, masses; active bowel sounds all quadrants GU: no hernia, testicular mass, penile discharge Lymph: no cervical, axillary or inguinal adenopathy MSK: gait normal, muscle tone and strength WNL, no joint swelling, effusions, discoloration, crepitus   Shoulder: R Inspection: No muscle wasting or winging Ecchymosis/edema: neg  AC joint, scapula, clavicle: NT Cervical spine: NT, full ROM Spurling's: neg Abduction: full, 5/5 Flexion: full, 5/5 IR, full, lift-off: 5/5 ER at neutral: full, 5/5 AC crossover: neg Neer: pos Hawkins: pos Drop Test: neg Empty Can: pos Supraspinatus insertion: mild-mod T Bicipital groove: NT Speed's: neg Yergason's: neg Sulcus sign: neg Scapular dyskinesis: none C5-T1 intact  Neuro: Sensation intact Grip 5/5   SLR is mildly pos on the L  SKIN: clear, good turgor, color WNL, REDDISH VENOUS STASIS TYPE CHANGES LE WITH SCARRING SIGNIFICANT ON THE L LEG, 2 SMALL ULCERS WITH  GOOD GRANULATION TISSUE ON THE MEDIAL ASPECT OF HIS LEFT LEG.  >50 SKIN TAGS AROUND NECK Neuro: normal mental status, MODEST DECREASED SENSATION ON THE B LE AND LESSER EXTENT B UE Psych: alert; oriented to person, place and time, normally interactive and not anxious or depressed in appearance.  All labs reviewed with patient.  Lipids: Lab Results  Component Value Date   CHOL 144 06/11/2018   Lab Results  Component Value Date   HDL 36 (L) 06/11/2018   Lab Results  Component Value Date   LDLCALC 63 06/11/2018   Lab Results  Component Value Date   TRIG 225 (H) 06/11/2018   Lab Results    Component Value Date   CHOLHDL 4.0 06/11/2018   CBC: CBC Latest Ref Rng & Units 06/11/2018 06/26/2017 04/08/2016  WBC 3.4 - 10.8 x10E3/uL 8.1 7.9 8.1  Hemoglobin 13.0 - 17.7 g/dL 15.0 15.1 16.2  Hematocrit 37.5 - 51.0 % 44.4 43.4 46.6  Platelets 150 - 450 x10E3/uL 251 229 341    Basic Metabolic Panel:    Component Value Date/Time   NA 139 06/11/2018 1019   K 4.8 06/11/2018 1019   CL 99 06/11/2018 1019   CO2 24 06/11/2018 1019   BUN 14 06/11/2018 1019   CREATININE 1.05 06/11/2018 1019   GLUCOSE 160 (H) 06/11/2018 1019   GLUCOSE 158 (H) 06/18/2014 0923   CALCIUM 10.2 06/11/2018 1019   Hepatic Function Latest Ref Rng & Units 06/11/2018 06/26/2017 04/08/2016  Total Protein 6.0 - 8.5 g/dL 6.5 6.6 6.6  Albumin 3.5 - 5.5 g/dL 4.4 4.4 4.4  AST 0 - 40 IU/L '15 12 16  '$ ALT 0 - 44 IU/L '24 21 26  '$ Alk Phosphatase 39 - 117 IU/L 43 41 44  Total Bilirubin 0.0 - 1.2 mg/dL 0.4 0.3 0.3  Bilirubin, Direct 0.00 - 0.40 mg/dL 0.16 0.08 0.13    Lab Results  Component Value Date   HGBA1C 7.8 (H) 06/11/2018   Lab Results  Component Value Date   TSH 1.022 07/27/2007   Lab Results  Component Value Date   PSA 0.3 02/19/2014   PSA 0.2 11/15/2012   PSA 0.3 04/09/2010    Assessment and Plan:   Healthcare maintenance: Essentially stable with regards to his overall health maintenance.  He needs to continue to lose weight, and he really needs to stop smoking.  Diabetic leg ulcer ([place]): 2 lower extremity diabetic ulcers.  Also with arterial disease in this leg.  Prior very large ulcer which took greater than 2 years to heal and multiple interventions.  Concerning again.  At this point he does not want to involve wound care or plastic surgery.  His wife is a Firefighter of greater than 30 years, and I think that this is reasonable with her working on this daily.  I can trust that she will call me if it worsens and we will involve plastic surgery.  Morbid obesity with BMI of 50.0-59.9, adult [facility]):  He needs to work on his diet and lose weight at all cost.  Type 2 diabetes mellitus with diabetic ulcer of left lower leg ([place]).  Diabetes is still not at goal, despite maximal medication, and I am good to increase his Victoza to the maximum dose.  Suspect transition to insulin will be soon.  He is reluctant.  Essential hypertension, currently stable and at goal.  Hyperlipidemia LDL goal <70, currently not stable and not at goal.  Start the patient on Crestor 40.  Discontinue Zocor.  Diet.  TOBACCO ABUSE.  Discontinue cigarettes at all cost.  This has ramifications for multiple organ systems including wound healing and diabetic ulcer patients.  Circulation disorder of lower extremity: He continues on [place].  Challenging case.  Multiple history of wounds.  Generalized anxiety disorder: Relatively stable.  Restless legs syndrome (RLS) I suspect that this is contributing on some level to his ongoing symptoms, but there is symptom overlap between this and neuropathy.  He is on Requip.  Right shoulder strain, initial encounter.  Difficult to know regarding the encounter several years ago, but at least during Christmas it sounds as if he had a relatively minor rotator cuff strain.  His strength is full now, and he does not appear to have a full-thickness or high-grade partial-thickness cuff tear.  He is having some minor impingement.  She has been slowly improving.  Basic range of motion and activity with weight loss would be a good idea.  Diabetic polyneuropathy associated with type 2 diabetes mellitus ([place]).  Globally worsening over time.  Impairing him enough to a degree that he wants to go on some medication.  Given that he is 400 pounds, I am going to go ahead and start him on some gabapentin in a relatively high dose with taper as below.  Sedation is the limiting step.  Subacute left lumbar radiculopathy.  BMI is 54.  Challenging.  Recommended weight loss, more basic activity, basic activity  and stretching overall.  Increase activity as able.  Start gabapentin.  Multiple acquired skin tags.  Noted.  He does not like these from a cosmetic standpoint.  I asked him to call dermatology and they could potentially electively remove them at his choice.  Other chronic pain.  Intermittent chronic pain secondary to diabetic ulcers.  He requests some opioid analgesics.  I am going to start him on gabapentin, which may make a difference here as well.  Long discussion of [place] stop act.  Currently on Mobic as well.  I am to give him some Tylenol 3 to use on an as-needed basis, but I would not like to have him habitually use these.  With worsening pain regarding his diabetic ulcers, this seems to be reasonable.  Erectile dysfunction due to type 2 diabetes mellitus ([place]).  Failure of response at maximal dose of Viagra.  Again challenging, multiple systems involved including diabetes and hypertension.  I am going to give the patient some Cialis to see if this makes a difference.  If he is not unable to achieve an erection at that point, I recommend that he see urology, they could consider potentially some Trimix or other injectable or possibly vacuum device or other things that they deal with on him regular basis.  >60 minutes spent in face to face time with patient, >50% spent in counselling or coordination of care.  >25 minutes was spent and primary consultation regarding conditions over the above health maintenance.  This was done primarily in counseling, but also to some degree with coordination of care.  The extent of this conversation as documented in detail as above listed beside each condition. Complex, where patient brought up > 10 significant conditions above health maintenance.   Health Maintenance Exam: The patient's preventative maintenance and recommended screening tests for an annual wellness exam were reviewed in full today. Brought up to date unless services declined.  Counselled  on the importance of diet, exercise, and its role in overall health and mortality. The patient's FH and SH was  reviewed, including their home life, tobacco status, and drug and alcohol status.  Follow-up in 1 year for physical exam or additional follow-up below.  Follow-up: Return in about 6 months (around 12/27/2018). Or follow-up in 1 year if not noted.  Meds ordered this encounter  Medications  . liraglutide (VICTOZA) 18 MG/3ML SOPN    Sig: Inject 0.3 mLs (1.8 mg total) into the skin daily.    Dispense:  9 mL    Refill:  3  . rosuvastatin (CRESTOR) 40 MG tablet    Sig: Take 1 tablet (40 mg total) by mouth daily.    Dispense:  90 tablet    Refill:  3  . tadalafil (ADCIRCA/CIALIS) 20 MG tablet    Sig: Take 1 tablet (20 mg total) by mouth daily as needed for erectile dysfunction (30 minutes prior to intercourse).    Dispense:  10 tablet    Refill:  11  . acetaminophen-codeine (TYLENOL #3) 300-30 MG tablet    Sig: Take 1 tablet by mouth every 6 (six) hours as needed for moderate pain.    Dispense:  50 tablet    Refill:  2  . gabapentin (NEURONTIN) 300 MG capsule    Sig: Take 1 capsule (300 mg total) by mouth 3 (three) times daily. See taper written out    Dispense:  270 capsule    Refill:  3   Patient Instructions  Increase your Victoza dose to 1.8 mg daily (up from 1.2)  Generic Gabapentin Titration Schedule  Generic Gabapentin (generic form of Neurontin) comes in 300 mg tablets or capsules.   You have to titrate your dose slowly to reduce side effects and reduce sedation / sleepiness.    Week               Breakfast  Lunch   Dinner One                 0   0   300 mg Two   '300mg'$    0   '300mg'$  Three   '300mg'$    '300mg'$    '300mg'$  Four   '300mg'$    '300mg'$    '600mg'$  (2 tabs) Five   '600mg'$  (2 tabs) '300mg'$    '600mg'$  (2 tabs) Six   '600mg'$  (2 tabs) '600mg'$  (2 tabs) '600mg'$  (2 tabs) Seven  '600mg'$  (2 tabs) '600mg'$  (2 tabs) '900mg'$  (3 tabs) Eight   '900mg'$  (3 tabs) '600mg'$  (2 tabs) '900mg'$  (3 tabs)  If you have  any problems at any time, drop back to the previous dosing schedule. Continue with this dose for 1 week, and then try to go up the next step again.      Signed,  Maud Deed. Eugina Row, MD   Allergies as of 06/28/2018      Reactions   Cashew Nut Oil Anaphylaxis   Cashews, 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   Peanut Oil Anaphylaxis   Pistachio Nut (diagnostic) Hives, Itching, Rash, Shortness Of Breath, Swelling   Betadine [povidone Iodine]    Raised rash 04/23/14   Latex Dermatitis   Penicillins    REACTION: rash all over as child   Lidocaine Other (See Comments)   Minimal effect with lidocaine, prefers different anesthetic      Medication List       Accurate as of June 28, 2018 11:59 PM. Always use your most recent med list.        acetaminophen-codeine 300-30 MG tablet Commonly known as:  TYLENOL #3 Take 1 tablet by mouth every 6 (six) hours as  needed for moderate pain.   EPINEPHrine 0.3 mg/0.3 mL Soaj injection Commonly known as:  EPI-PEN INJECT 0.3 ML INTO THE  MUSCLE ONCE FOR 1 DOSE.   gabapentin 300 MG capsule Commonly known as:  NEURONTIN Take 1 capsule (300 mg total) by mouth 3 (three) times daily. See taper written out   glipiZIDE 10 MG 24 hr tablet Commonly known as:  GLUCOTROL XL TAKE 2 TABLETS BY MOUTH  DAILY WITH BREAKFAST   Insulin Pen Needle 31G X 8 MM Misc Commonly known as:  B-D ULTRAFINE III SHORT PEN Use to inject Victoza daily.  Dx: E11.622/L97.929   liraglutide 18 MG/3ML Sopn Commonly known as:  VICTOZA Inject 0.3 mLs (1.8 mg total) into the skin daily.   lisinopril-hydrochlorothiazide 20-12.5 MG tablet Commonly known as:  PRINZIDE,ZESTORETIC TAKE 2 TABLETS BY MOUTH  DAILY   meloxicam 15 MG tablet Commonly known as:  MOBIC TAKE 1 TABLET BY MOUTH  EVERY MORNING   metFORMIN 500 MG 24 hr tablet Commonly known as:  GLUCOPHAGE-XR TAKE 4 TABLETS DAILY WITH  BREAKFAST   multivitamin tablet Take 1 tablet by mouth daily.   ONETOUCH DELICA LANCETS 40X  Misc 1 each 2 (two) times daily by Does not apply route. E11.622/L97.929   [place] w/Device Kit 1 each by Does not apply route 2 (two) times daily. Dx: E11.622/L97.929   ONETOUCH VERIO test strip Generic drug:  glucose blood USE TO CHECK BLOOD SUGAR   TWO TIMES A DAY   oxyCODONE-acetaminophen 10-325 MG tablet Commonly known as:  PERCOCET Take 1 tablet by mouth every 4 (four) hours as needed.   pantoprazole 40 MG tablet Commonly known as:  PROTONIX TAKE 1 TABLET BY MOUTH  DAILY   PARoxetine 30 MG tablet Commonly known as:  PAXIL TAKE 1 TABLET BY MOUTH  EVERY MORNING   pentoxifylline 400 MG CR tablet Commonly known as:  TRENTAL TAKE 1 TABLET BY MOUTH 3  TIMES A DAY WITH MEALS   pioglitazone 45 MG tablet Commonly known as:  ACTOS TAKE 1 TABLET BY MOUTH  DAILY   rOPINIRole 0.5 MG tablet Commonly known as:  REQUIP TAKE 1 TABLET BY MOUTH TWO  TIMES DAILY   rosuvastatin 40 MG tablet Commonly known as:  CRESTOR Take 1 tablet (40 mg total) by mouth daily.   tadalafil 20 MG tablet Commonly known as:  ADCIRCA/CIALIS Take 1 tablet (20 mg total) by mouth daily as needed for erectile dysfunction (30 minutes prior to intercourse).   varenicline 1 MG tablet Commonly known as:  CHANTIX CONTINUING MONTH PAK Take 1 tablet (1 mg total) by mouth 2 (two) times daily.   varenicline 0.5 MG X 11 & 1 MG X 42 tablet Commonly known as:  CHANTIX STARTING MONTH PAK Take one 0.5 mg tab daily for 3 days,then inc. to one 0.5 mg tab BID x 4 days, then inc. to one 1 mg tab BID.   vitamin C 500 MG tablet Commonly known as:  ASCORBIC ACID Take 500 mg by mouth daily.

## 2018-06-28 ENCOUNTER — Encounter: Payer: Self-pay | Admitting: Family Medicine

## 2018-06-28 ENCOUNTER — Ambulatory Visit (INDEPENDENT_AMBULATORY_CARE_PROVIDER_SITE_OTHER): Payer: Managed Care, Other (non HMO) | Admitting: Family Medicine

## 2018-06-28 VITALS — BP 130/70 | HR 94 | Temp 97.5°F | Ht 69.75 in | Wt 371.4 lb

## 2018-06-28 DIAGNOSIS — I10 Essential (primary) hypertension: Secondary | ICD-10-CM | POA: Diagnosis not present

## 2018-06-28 DIAGNOSIS — L97929 Non-pressure chronic ulcer of unspecified part of left lower leg with unspecified severity: Secondary | ICD-10-CM

## 2018-06-28 DIAGNOSIS — M5416 Radiculopathy, lumbar region: Secondary | ICD-10-CM

## 2018-06-28 DIAGNOSIS — L918 Other hypertrophic disorders of the skin: Secondary | ICD-10-CM

## 2018-06-28 DIAGNOSIS — F17200 Nicotine dependence, unspecified, uncomplicated: Secondary | ICD-10-CM

## 2018-06-28 DIAGNOSIS — Z0000 Encounter for general adult medical examination without abnormal findings: Secondary | ICD-10-CM | POA: Diagnosis not present

## 2018-06-28 DIAGNOSIS — E1169 Type 2 diabetes mellitus with other specified complication: Secondary | ICD-10-CM

## 2018-06-28 DIAGNOSIS — E785 Hyperlipidemia, unspecified: Secondary | ICD-10-CM | POA: Diagnosis not present

## 2018-06-28 DIAGNOSIS — N521 Erectile dysfunction due to diseases classified elsewhere: Secondary | ICD-10-CM

## 2018-06-28 DIAGNOSIS — E1142 Type 2 diabetes mellitus with diabetic polyneuropathy: Secondary | ICD-10-CM

## 2018-06-28 DIAGNOSIS — G2581 Restless legs syndrome: Secondary | ICD-10-CM

## 2018-06-28 DIAGNOSIS — G8929 Other chronic pain: Secondary | ICD-10-CM

## 2018-06-28 DIAGNOSIS — L97909 Non-pressure chronic ulcer of unspecified part of unspecified lower leg with unspecified severity: Secondary | ICD-10-CM

## 2018-06-28 DIAGNOSIS — F411 Generalized anxiety disorder: Secondary | ICD-10-CM

## 2018-06-28 DIAGNOSIS — S46911A Strain of unspecified muscle, fascia and tendon at shoulder and upper arm level, right arm, initial encounter: Secondary | ICD-10-CM

## 2018-06-28 DIAGNOSIS — E11622 Type 2 diabetes mellitus with other skin ulcer: Secondary | ICD-10-CM

## 2018-06-28 DIAGNOSIS — I999 Unspecified disorder of circulatory system: Secondary | ICD-10-CM

## 2018-06-28 DIAGNOSIS — Z6841 Body Mass Index (BMI) 40.0 and over, adult: Secondary | ICD-10-CM

## 2018-06-28 MED ORDER — GABAPENTIN 300 MG PO CAPS: 300.0000 mg | ORAL_CAPSULE | Freq: Three times a day (TID) | ORAL | 3 refills | Status: DC

## 2018-06-28 MED ORDER — TADALAFIL 20 MG PO TABS: 20.0000 mg | ORAL_TABLET | Freq: Every day | ORAL | 11 refills | Status: DC | PRN

## 2018-06-28 MED ORDER — ACETAMINOPHEN-CODEINE #3 300-30 MG PO TABS: 1.0000 | ORAL_TABLET | Freq: Four times a day (QID) | ORAL | 2 refills | Status: DC | PRN

## 2018-06-28 MED ORDER — LIRAGLUTIDE 18 MG/3ML ~~LOC~~ SOPN: 1.8000 mg | PEN_INJECTOR | Freq: Every day | SUBCUTANEOUS | 3 refills | Status: DC

## 2018-06-28 MED ORDER — ROSUVASTATIN CALCIUM 40 MG PO TABS: 40.0000 mg | ORAL_TABLET | Freq: Every day | ORAL | 3 refills | Status: DC

## 2018-06-28 NOTE — Patient Instructions (Addendum)
Increase your Victoza dose to 1.8 mg daily (up from 1.2)  Generic Gabapentin Titration Schedule  Generic Gabapentin (generic form of Neurontin) comes in 300 mg tablets or capsules.   You have to titrate your dose slowly to reduce side effects and reduce sedation / sleepiness.    Week               Breakfast  Lunch   Dinner One                 0   0   300 mg Two   300mg    0   300mg  Three   300mg    300mg    300mg  Four   300mg    300mg    600mg  (2 tabs) Five   600mg  (2 tabs) 300mg    600mg  (2 tabs) Six   600mg  (2 tabs) 600mg  (2 tabs) 600mg  (2 tabs) Seven  600mg  (2 tabs) 600mg  (2 tabs) 900mg  (3 tabs) Eight   900mg  (3 tabs) 600mg  (2 tabs) 900mg  (3 tabs)  If you have any problems at any time, drop back to the previous dosing schedule. Continue with this dose for 1 week, and then try to go up the next step again.

## 2018-06-30 DIAGNOSIS — E1169 Type 2 diabetes mellitus with other specified complication: Secondary | ICD-10-CM | POA: Insufficient documentation

## 2018-06-30 DIAGNOSIS — E1142 Type 2 diabetes mellitus with diabetic polyneuropathy: Secondary | ICD-10-CM

## 2018-06-30 DIAGNOSIS — N521 Erectile dysfunction due to diseases classified elsewhere: Secondary | ICD-10-CM

## 2018-06-30 DIAGNOSIS — G8929 Other chronic pain: Secondary | ICD-10-CM | POA: Insufficient documentation

## 2018-06-30 HISTORY — DX: Type 2 diabetes mellitus with diabetic polyneuropathy: E11.42

## 2018-07-03 ENCOUNTER — Encounter: Payer: Self-pay | Admitting: Family Medicine

## 2018-07-04 ENCOUNTER — Telehealth: Payer: Self-pay | Admitting: *Deleted

## 2018-07-04 NOTE — Telephone Encounter (Signed)
Received MyChart message from [name] [name] stating a PA needs to be done for his Cialis.  PA completed on CoverMyMeds.  Sent for review.  Can take up to 72 hours for a decision.

## 2018-07-04 NOTE — Telephone Encounter (Signed)
PA denied.  This was denied because you did not meet the following clinical requirements.  The requested medication is not a covered benefit.

## 2018-07-05 ENCOUNTER — Telehealth: Payer: Self-pay | Admitting: *Deleted

## 2018-07-05 MED ORDER — TADALAFIL 5 MG PO TABS: ORAL_TABLET | ORAL | 11 refills | Status: DC

## 2018-07-05 NOTE — Telephone Encounter (Signed)
PA approved through 01/03/2019.

## 2018-07-05 NOTE — Addendum Note (Signed)
Addended by: Damita Lack on: 07/05/2018 09:51 AM   Modules accepted: Orders

## 2018-07-05 NOTE — Telephone Encounter (Signed)
Received fax from [facility] requesting PA for Tylenol #3.  PA completed on CoverMyMeds.  Sent for review.  Can take up to 72 hours for a decision.

## 2018-07-05 NOTE — Telephone Encounter (Signed)
Generic cialis 5 mg, 1 po 30 minutes prior to intercourse, #15 tablets, 11 refills

## 2018-07-11 ENCOUNTER — Encounter: Payer: Self-pay | Admitting: Family Medicine

## 2018-07-12 NOTE — Telephone Encounter (Signed)
Dr. Patsy Lager,   Did you complete the form from [facility] about Mr. Sistare's pain medicine that was in your in box?

## 2018-07-12 NOTE — Telephone Encounter (Signed)
I did not see it if there was one there?  Can you check, place on top of my box, but if it is not there, we will need to get another one.   Dx: chronic pain Prior usage of percocet and norco in past with goal of decreased overall dosage of opioid.

## 2018-07-20 ENCOUNTER — Other Ambulatory Visit: Payer: Self-pay | Admitting: *Deleted

## 2018-07-20 MED ORDER — EPINEPHRINE 0.3 MG/0.3ML IJ SOAJ: INTRAMUSCULAR | 0 refills | Status: DC

## 2018-07-22 ENCOUNTER — Encounter: Payer: Self-pay | Admitting: Family Medicine

## 2018-07-27 LAB — HM DIABETES EYE EXAM

## 2018-07-29 ENCOUNTER — Encounter: Payer: Self-pay | Admitting: Family Medicine

## 2018-07-29 ENCOUNTER — Other Ambulatory Visit: Payer: Self-pay | Admitting: Family Medicine

## 2018-07-30 MED ORDER — LIRAGLUTIDE 18 MG/3ML ~~LOC~~ SOPN: 1.8000 mg | PEN_INJECTOR | Freq: Every day | SUBCUTANEOUS | 3 refills | Status: DC

## 2018-07-31 ENCOUNTER — Encounter: Payer: Self-pay | Admitting: Family Medicine

## 2018-07-31 ENCOUNTER — Telehealth: Payer: Self-pay | Admitting: *Deleted

## 2018-07-31 NOTE — Telephone Encounter (Signed)
PA denied.  Plan Exclusion.  [facility] notified of denial via fax.

## 2018-07-31 NOTE — Telephone Encounter (Signed)
Received fax from [facility] requesting PA for Chantix 1 mg.  PA completed on CoverMyMeds.  Sent for review.  Can take up to 72 hours for a decision.

## 2018-08-02 ENCOUNTER — Encounter: Payer: Self-pay | Admitting: Family Medicine

## 2018-08-16 ENCOUNTER — Encounter: Payer: Self-pay | Admitting: Family Medicine

## 2018-08-17 MED ORDER — LIRAGLUTIDE 18 MG/3ML ~~LOC~~ SOPN: 1.8000 mg | PEN_INJECTOR | Freq: Every day | SUBCUTANEOUS | 3 refills | Status: DC

## 2018-08-22 ENCOUNTER — Encounter: Payer: Self-pay | Admitting: Family Medicine

## 2018-08-23 MED ORDER — LIRAGLUTIDE 18 MG/3ML ~~LOC~~ SOPN: PEN_INJECTOR | SUBCUTANEOUS | 3 refills | Status: DC

## 2018-08-26 ENCOUNTER — Encounter: Payer: Self-pay | Admitting: Family Medicine

## 2018-08-27 MED ORDER — GABAPENTIN 300 MG PO CAPS: 900.0000 mg | ORAL_CAPSULE | Freq: Three times a day (TID) | ORAL | 1 refills | Status: DC

## 2018-08-27 NOTE — Telephone Encounter (Signed)
Last office visit 06/28/2018 for CPE.  Last refilled 06/28/2018 for #270 with 3 refills.  Patient states he is now taking 3 capsules three times a day.  Ok to send in #810 to [facility].

## 2018-08-29 NOTE — Telephone Encounter (Signed)
Any disability paperwork will be filled out within 14 days.  Complicated issue. I will contact the patient directly, not her husband.

## 2018-08-29 NOTE — Telephone Encounter (Signed)
Dr. Patsy Lager,    Have you completed the paperwork in your in box for [name]??

## 2018-09-13 ENCOUNTER — Other Ambulatory Visit: Payer: Self-pay | Admitting: Family Medicine

## 2018-09-13 MED ORDER — VARENICLINE TARTRATE 1 MG PO TABS: 1.0000 mg | ORAL_TABLET | Freq: Two times a day (BID) | ORAL | 0 refills | Status: DC

## 2018-09-23 ENCOUNTER — Other Ambulatory Visit: Payer: Self-pay | Admitting: Family Medicine

## 2018-09-24 ENCOUNTER — Encounter: Payer: Self-pay | Admitting: Family Medicine

## 2018-09-24 MED ORDER — VARENICLINE TARTRATE 1 MG PO TABS: 1.0000 mg | ORAL_TABLET | Freq: Two times a day (BID) | ORAL | 0 refills | Status: DC

## 2018-10-23 ENCOUNTER — Encounter: Payer: Self-pay | Admitting: Family Medicine

## 2018-10-23 DIAGNOSIS — E11622 Type 2 diabetes mellitus with other skin ulcer: Secondary | ICD-10-CM

## 2018-10-24 MED ORDER — GLUCOSE BLOOD VI STRP: ORAL_STRIP | 3 refills | Status: DC

## 2018-11-28 ENCOUNTER — Encounter: Payer: Self-pay | Admitting: Family Medicine

## 2018-11-29 MED ORDER — ONETOUCH DELICA LANCETS 33G MISC: 1.0000 | Freq: Two times a day (BID) | 3 refills | Status: DC

## 2018-12-13 ENCOUNTER — Encounter: Payer: Self-pay | Admitting: Family Medicine

## 2018-12-27 ENCOUNTER — Ambulatory Visit: Payer: Managed Care, Other (non HMO) | Admitting: Family Medicine

## 2019-01-10 ENCOUNTER — Encounter: Payer: Self-pay | Admitting: Family Medicine

## 2019-01-16 ENCOUNTER — Other Ambulatory Visit: Payer: Self-pay | Admitting: Family Medicine

## 2019-01-17 NOTE — Telephone Encounter (Signed)
LOV 06/28/2018 and future appointment on 02/06/2019. Please review

## 2019-01-20 ENCOUNTER — Encounter: Payer: Self-pay | Admitting: Family Medicine

## 2019-01-27 ENCOUNTER — Encounter: Payer: Self-pay | Admitting: Family Medicine

## 2019-01-30 ENCOUNTER — Encounter: Payer: Self-pay | Admitting: Family Medicine

## 2019-02-06 ENCOUNTER — Ambulatory Visit (INDEPENDENT_AMBULATORY_CARE_PROVIDER_SITE_OTHER): Payer: Managed Care, Other (non HMO) | Admitting: Family Medicine

## 2019-02-06 ENCOUNTER — Encounter: Payer: Self-pay | Admitting: Family Medicine

## 2019-02-06 ENCOUNTER — Other Ambulatory Visit: Payer: Self-pay

## 2019-02-06 VITALS — BP 120/78 | HR 92 | Temp 98.0°F | Ht 69.75 in | Wt 383.2 lb

## 2019-02-06 DIAGNOSIS — Z23 Encounter for immunization: Secondary | ICD-10-CM | POA: Diagnosis not present

## 2019-02-06 DIAGNOSIS — G4733 Obstructive sleep apnea (adult) (pediatric): Secondary | ICD-10-CM | POA: Diagnosis not present

## 2019-02-06 DIAGNOSIS — F17200 Nicotine dependence, unspecified, uncomplicated: Secondary | ICD-10-CM

## 2019-02-06 DIAGNOSIS — E11622 Type 2 diabetes mellitus with other skin ulcer: Secondary | ICD-10-CM | POA: Diagnosis not present

## 2019-02-06 DIAGNOSIS — E785 Hyperlipidemia, unspecified: Secondary | ICD-10-CM

## 2019-02-06 DIAGNOSIS — L97929 Non-pressure chronic ulcer of unspecified part of left lower leg with unspecified severity: Secondary | ICD-10-CM | POA: Diagnosis not present

## 2019-02-06 DIAGNOSIS — I10 Essential (primary) hypertension: Secondary | ICD-10-CM

## 2019-02-06 DIAGNOSIS — Z6841 Body Mass Index (BMI) 40.0 and over, adult: Secondary | ICD-10-CM

## 2019-02-06 LAB — POCT GLYCOSYLATED HEMOGLOBIN (HGB A1C): Hemoglobin A1C: 10.7 % — AB (ref 4.0–5.6)

## 2019-02-06 NOTE — Progress Notes (Signed)
Analeigh Aries T. Laretha Luepke, MD Primary Care and [place] at [facility] [place], [address] Phone: [phone]  FAX: [phone]  [name] [name] - 58 y.o. male  MRN [id number]  Date of Birth: February 13, 1961  Visit Date: 02/06/2019  PCP: Owens Loffler, MD  Referred by: Owens Loffler, MD  Chief Complaint  Patient presents with  . Diabetes  . Sleep Apnea    Falling asleep at work  . Muslce Burning    Trouble walking  . Dizziness  . Fall    4 to 5 falls in the last 2 months   Subjective:   [name] [name] is a 58 y.o. very pleasant male patient who presents with the following:  Many problems.  Diabetes Mellitus: Tolerating Medications: yes Compliance with diet: fair, Body mass index is 55.39 kg/m. Exercise: minimal / intermittent Avg blood sugars at home: often > 400 Foot problems: neuropathy, some ulcers, mostly healed Hypoglycemia: none No nausea, vomitting, blurred vision, polyuria.  Lab Results  Component Value Date   HGBA1C 10.7 (A) 02/06/2019   HGBA1C 7.8 (H) 06/11/2018   HGBA1C 8.3 (A) 01/05/2018   Lab Results  Component Value Date   MICROALBUR 2.82 (H) 07/27/2007   LDLCALC 63 06/11/2018   CREATININE 1.05 06/11/2018    Wt Readings from Last 3 Encounters:  02/06/19 (!) 383 lb 4 oz (173.8 kg)  06/28/18 (!) 371 lb 6.4 oz (168.5 kg)  04/03/18 (!) 375 lb 8 oz (170.3 kg)    Sleep apnea: has never been tested. Sleep medicine consult.  He is falling asleep all of the time while sitting, at work, and when relaxing at home.   Sleep study Endocrine consult - very out of control.   a1c is 11 now  Has fallen out of bed a few times.  Significant neuropathy.  Prior ABI's were normal.   Body mass index is 55.39 kg/m.   Diabetic neuropathy. On Neurontin 900 mg TID ABI's are normal Had diabetic ulcers requiring wound care and plastics in the OR  Past Medical History, Surgical History, Social History,  Family History, Problem List, Medications, and Allergies have been reviewed and updated if relevant.  Patient Active Problem List   Diagnosis Date Noted  . Diabetic leg ulcer ([place]) 07/18/2013    Priority: High  . 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 Morbid obesity with BMI of 50.0-59.9, adult ([place]) 09/29/2006    Priority: High  . Hyperlipidemia LDL goal <70 01/16/2009    Priority: Medium  . TOBACCO ABUSE 07/27/2007    Priority: Medium  . Essential hypertension 09/29/2006    Priority: Medium  . Diabetic polyneuropathy associated with type 2 diabetes mellitus ([place]) 06/30/2018  . Other chronic pain 06/30/2018  . Erectile dysfunction due to type 2 diabetes mellitus ([place]) 06/30/2018  . Circulation disorder of lower extremity 05/31/2015  . BPH (benign prostatic hyperplasia) 12/24/2014  . IBS (irritable bowel syndrome) 12/24/2014  . 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 Restless legs syndrome (RLS) 11/20/2012  . Generalized anxiety disorder   . CALCULUS, KIDNEY 05/30/2002    Past Medical History:  Diagnosis Date  . Angio-edema   . Anxiety   . Depression   . Diabetes mellitus type II    type 2  . GERD (gastroesophageal reflux disease)   . Headache    migraine  . History of  kidney stones   . HLD (hyperlipidemia)   . HTN (hypertension)   . Morbid obesity ([place])   . Restless legs syndrome (RLS) 11/20/2012  . Tobacco abuse   . Ulcer of left ankle (HCC)    last 2 months dime size dry dressing changing q day  . Urticaria     Past Surgical History:  Procedure Laterality Date  . Arm surgery Right 1969   Abscess excision and debridement at elbow  . COLONOSCOPY WITH PROPOFOL N/A 09/06/2016   Procedure: COLONOSCOPY WITH PROPOFOL;  Surgeon: Doran Stabler, MD;  Location: [facility];  Service: Gastroenterology;  Laterality: N/A;  . I&D EXTREMITY Left 05/29/2014   Procedure: IRRIGATION AND  DEBRIDEMENT OF LEFT LEG WOUND AND PLACEMENT OF  INTEGRA  AND  VAC;  Surgeon: Theodoro Kos, DO;  Location: [facility];  Service: Plastics;  Laterality: Left;  . INCISION AND DRAINAGE OF WOUND Left 04/23/2014   Procedure: IRRIGATION AND DEBRIDEMENT LOWER LEFT LEG WOUND WITH PLACEMENT OF INTEGRA AND VAC;  Surgeon: Theodoro Kos, DO;  Location: [facility];  Service: Plastics;  Laterality: Left;  . kidney stone removal  2004  . MINOR APPLICATION OF WOUND VAC Left 04/23/2014   Procedure: MINOR APPLICATION OF WOUND VAC;  Surgeon: Theodoro Kos, DO;  Location: [facility];  Service: Plastics;  Laterality: Left;  . SKIN SPLIT GRAFT Left 06/18/2014   Procedure: SKIN GRAFT SPLIT THICKNESS TO LOWER LEFT LEG WOUND WITH PLACEMENT OF VAC;  Surgeon: Theodoro Kos, DO;  Location: [facility];  Service: Plastics;  Laterality: Left;  Marland Kitchen VASECTOMY  1994    Social History   Socioeconomic History  . Marital status: Married    Spouse name: Not on file  . Number of children: 3  . Years of education: Not on file  . Highest education level: Not on file  Occupational History  . 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 Financial resource strain: Not on file  . Food insecurity    Worry: Not on file    Inability: Not on file  . Transportation needs    Medical: Not on file    Non-medical: Not on file  Tobacco Use  . Smoking status: Current Every Day Smoker    Packs/day: 0.25    Years: 40.00    Pack years: 10.00    Types: Cigarettes  . Smokeless tobacco: Never Used  . Tobacco comment: one cigarette daily  Substance and Sexual Activity  . Alcohol use: Yes    Alcohol/week: 0.0 standard drinks    Comment: rarely  . Drug use: No  . Sexual activity: Yes    Partners: Female  Lifestyle  . Physical activity    Days per week: Not on file    Minutes per session: Not on file  . Stress: Not on file  Relationships  . Social  Herbalist on phone: Not on file    Gets together: Not on file    Attends religious service: Not on file    Active member of club or organization: Not on file    Attends meetings of clubs or organizations: Not on file    Relationship status: Not on file  . Intimate partner violence    Fear of current or ex partner: Not on file    Emotionally abused: Not on file    Physically abused: Not on file    Forced sexual activity: Not on file  Other Topics Concern  . Not on file  Social History Narrative  . Not on file    Family History  Problem Relation Age of Onset  . Stroke Mother        multiple  . Leukemia Mother   . Pneumonia Mother   . Cancer Mother   . Diabetes Mother   . Diabetes Father   . Heart disease Father   . Hyperlipidemia Father   . Hypertension Father   . Allergic rhinitis Neg Hx   . Angioedema Neg Hx   . Asthma Neg Hx   . Eczema Neg Hx   . Immunodeficiency Neg Hx   . Urticaria Neg Hx     Allergies  Allergen Reactions  . Cashew Nut Oil Anaphylaxis    Cashews, 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  Marland Kitchen Peanut Oil Anaphylaxis  . Pistachio Nut (Diagnostic) Hives, Itching, Rash, Shortness Of Breath and Swelling  . Betadine [Povidone Iodine]     Raised rash 04/23/14  . Latex Dermatitis  . Penicillins     REACTION: rash all over as child  . Lidocaine Other (See Comments)    Minimal effect with lidocaine, prefers different anesthetic   Medication list reviewed and updated in full in [place].  GEN: No acute illnesses, no fevers, chills. GI: No n/v/d, eating normally Pulm: No SOB Interactive and getting along well at home.  Otherwise, ROS is as per the HPI.  Objective:   BP 120/78   Pulse 92   Temp 98 F (36.7 C) (Temporal)   Ht 5' 9.75" (1.772 m)   Wt (!) 383 lb 4 oz (173.8 kg)   SpO2 94%   BMI 55.39 kg/m   GEN: WDWN, NAD, Non-toxic, A & O x 3 HEENT: Atraumatic, Normocephalic. Neck supple. No masses, No LAD. Ears and Nose: No external  deformity. CV: RRR, No M/G/R. No JVD. No thrill. No extra heart sounds. PULM: CTA B, no wheezes, crackles, rhonchi. No retractions. No resp. distress. No accessory muscle use. EXTR: No c/c/1+ edema B LE, healing B ulcers with scarring NEURO Normal gait.  PSYCH: Normally interactive. Conversant. Not depressed or anxious appearing.  Calm demeanor.   Laboratory and Imaging Data: Results for orders placed or performed in visit on 02/06/19  POCT glycosylated hemoglobin (Hb A1C)  Result Value Ref Range   Hemoglobin A1C 10.7 (A) 4.0 - 5.6 %   HbA1c POC (<> result, manual entry)     HbA1c, POC (prediabetic range)     HbA1c, POC (controlled diabetic range)      Lab Results  Component Value Date   WBC 8.1 06/11/2018   HGB 15.0 06/11/2018   HCT 44.4 06/11/2018   PLT 251 06/11/2018   GLUCOSE 160 (H) 06/11/2018   CHOL 144 06/11/2018   TRIG 225 (H) 06/11/2018   HDL 36 (L) 06/11/2018   LDLDIRECT 76 02/19/2014   LDLCALC 63 06/11/2018   ALT 24 06/11/2018   AST 15 06/11/2018   NA 139 06/11/2018   K 4.8 06/11/2018   CL 99 06/11/2018   CREATININE 1.05 06/11/2018   BUN 14 06/11/2018   CO2 24 06/11/2018   TSH 1.022 07/27/2007   PSA 0.3 02/19/2014   HGBA1C 10.7 (A) 02/06/2019   MICROALBUR 2.82 (H) 07/27/2007     Assessment and Plan:     ICD-10-CM   1. 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 Need for influenza vaccination  Z23 Flu Vaccine QUAD 6+ mos PF IM (Fluarix Quad PF)  3. OSA (obstructive sleep apnea)  G47.33 Ambulatory referral to Pulmonology  4. Morbid obesity with BMI of 50.0-59.9, adult ([place])  E66.01 Ambulatory referral to Pulmonology   Z68.43   5. Essential hypertension  I10   6. TOBACCO ABUSE  F17.200   7. Hyperlipidemia LDL goal <70  E78.5    Globally not doing well. Body mass index is 55.39 kg/m.  Smoking cont  a1c = 11, get endo help  OSA,  consult sleep for help  Follow-up: 3 mo  No orders of the defined types were placed in this encounter.  Orders Placed This Encounter  Procedures  . Flu Vaccine QUAD 6+ mos PF IM (Fluarix Quad PF)  . Ambulatory referral to Endocrinology  . Ambulatory referral to Pulmonology  . POCT glycosylated hemoglobin (Hb A1C)    Signed,  Arlina Sabina T. Alison Breeding, MD   Outpatient Encounter Medications as of 02/06/2019  Medication Sig  . Blood Glucose Monitoring Suppl (ONETOUCH VERIO FLEX SYSTEM) w/Device KIT 1 each by Does not apply route 2 (two) times daily. Dx: E11.622/L97.929  . EPINEPHrine 0.3 mg/0.3 mL IJ SOAJ injection INJECT 0.3 ML INTO THE  MUSCLE ONCE FOR 1 DOSE.  Marland Kitchen gabapentin (NEURONTIN) 300 MG capsule TAKE 3 CAPSULES BY MOUTH 3  TIMES DAILY  . glipiZIDE (GLUCOTROL XL) 10 MG 24 hr tablet TAKE 2 TABLETS BY MOUTH  DAILY WITH BREAKFAST  . glucose blood (ONETOUCH VERIO) test strip USE TO CHECK BLOOD SUGAR TWO TIMES A DAY  . Insulin Pen Needle (B-D ULTRAFINE III SHORT PEN) 31G X 8 MM MISC Use to inject Victoza daily.  Dx: E11.622/L97.929  . liraglutide (VICTOZA) 18 MG/3ML SOPN Inject 1.8 mg subcutaneous daily  . lisinopril-hydrochlorothiazide (ZESTORETIC) 20-12.5 MG tablet TAKE 2 TABLETS BY MOUTH  DAILY  . meloxicam (MOBIC) 15 MG tablet TAKE 1 TABLET BY MOUTH  EVERY MORNING  . metFORMIN (GLUCOPHAGE-XR) 500 MG 24 hr tablet TAKE 4 TABLETS BY MOUTH  DAILY WITH BREAKFAST  . Multiple Vitamin (MULTIVITAMIN) tablet Take 1 tablet by mouth daily.    [name] Delica Lancets 54S MISC 1 each by Does not apply route 2 (two) times daily. E11.622/L97.929  . pantoprazole (PROTONIX) 40 MG tablet TAKE 1 TABLET BY MOUTH  DAILY  . PARoxetine (PAXIL) 30 MG tablet TAKE 1 TABLET BY MOUTH  EVERY MORNING  . pentoxifylline (TRENTAL) 400 MG CR tablet TAKE 1 TABLET BY MOUTH 3  TIMES A DAY WITH MEALS  . pioglitazone (ACTOS) 45 MG tablet TAKE 1 TABLET BY MOUTH  DAILY  . rOPINIRole (REQUIP) 0.5 MG tablet TAKE 1 TABLET BY  MOUTH TWO  TIMES DAILY  . rosuvastatin (CRESTOR) 40 MG tablet Take 1 tablet (40 mg total) by mouth daily.  . tadalafil (CIALIS) 5 MG tablet Take one tablet by mouth 30 minutes prior to intercourse.  . vitamin C (ASCORBIC ACID) 500 MG tablet Take 500 mg by mouth daily.  . [DISCONTINUED] acetaminophen-codeine (TYLENOL #3) 300-30 MG tablet Take 1 tablet by mouth every 6 (six) hours as needed for moderate pain.  . [DISCONTINUED] oxyCODONE-acetaminophen (PERCOCET) 10-325 MG tablet Take 1 tablet by mouth every 4 (four) hours as needed.  . [DISCONTINUED] varenicline (CHANTIX CONTINUING MONTH PAK) 1 MG tablet Take 1 tablet (1 mg total) by mouth 2 (two) times daily.   No facility-administered encounter medications on file as of 02/06/2019.

## 2019-02-06 NOTE — Patient Instructions (Signed)
They will call you about the diabetes specialist and sleep medicine doctor

## 2019-02-07 ENCOUNTER — Encounter: Payer: Self-pay | Admitting: Family Medicine

## 2019-02-15 ENCOUNTER — Encounter: Payer: Self-pay | Admitting: Pulmonary Disease

## 2019-02-15 ENCOUNTER — Other Ambulatory Visit: Payer: Self-pay

## 2019-02-15 ENCOUNTER — Ambulatory Visit (INDEPENDENT_AMBULATORY_CARE_PROVIDER_SITE_OTHER): Payer: Managed Care, Other (non HMO) | Admitting: Pulmonary Disease

## 2019-02-15 VITALS — BP 120/70 | HR 90 | Temp 97.0°F | Ht 70.0 in | Wt 382.0 lb

## 2019-02-15 DIAGNOSIS — G4733 Obstructive sleep apnea (adult) (pediatric): Secondary | ICD-10-CM

## 2019-02-15 NOTE — Progress Notes (Signed)
[name] [name]    [id number]    1960/10/05  Primary Care Physician:Copland, Frederico Hamman, MD  Referring Physician: Owens Loffler, MD [place],  [place] [address]  Chief complaint:   Patient with a history of snoring, witnessed apneas, difficulty staying asleep  HPI:  History of significant snoring Witnessed apneas Admits to excessive daytime sleepiness  He has fallen out of bed multiple times he has diabetes with diabetic neuropathy Morbid obesity  Active smoker-about a pack a day  No pertinent occupational history Admits to dryness of his mouth in the morning, morning headaches Memory is poor  Sleep quality is very poor  Outpatient Encounter Medications as of 02/15/2019  Medication Sig  . Blood Glucose Monitoring Suppl (ONETOUCH VERIO FLEX SYSTEM) w/Device KIT 1 each by Does not apply route 2 (two) times daily. Dx: E11.622/L97.929  . EPINEPHrine 0.3 mg/0.3 mL IJ SOAJ injection INJECT 0.3 ML INTO THE  MUSCLE ONCE FOR 1 DOSE.  Marland Kitchen gabapentin (NEURONTIN) 300 MG capsule TAKE 3 CAPSULES BY MOUTH 3  TIMES DAILY  . glipiZIDE (GLUCOTROL XL) 10 MG 24 hr tablet TAKE 2 TABLETS BY MOUTH  DAILY WITH BREAKFAST  . glucose blood (ONETOUCH VERIO) test strip USE TO CHECK BLOOD SUGAR TWO TIMES A DAY  . Insulin Pen Needle (B-D ULTRAFINE III SHORT PEN) 31G X 8 MM MISC Use to inject Victoza daily.  Dx: E11.622/L97.929  . liraglutide (VICTOZA) 18 MG/3ML SOPN Inject 1.8 mg subcutaneous daily  . lisinopril-hydrochlorothiazide (ZESTORETIC) 20-12.5 MG tablet TAKE 2 TABLETS BY MOUTH  DAILY  . meloxicam (MOBIC) 15 MG tablet TAKE 1 TABLET BY MOUTH  EVERY MORNING  . metFORMIN (GLUCOPHAGE-XR) 500 MG 24 hr tablet TAKE 4 TABLETS BY MOUTH  DAILY WITH BREAKFAST  . Multiple Vitamin (MULTIVITAMIN) tablet Take 1 tablet by mouth daily.    [name] Delica Lancets 71I MISC 1 each by Does not apply route 2 (two) times daily. E11.622/L97.929  . pantoprazole (PROTONIX) 40 MG tablet TAKE 1  TABLET BY MOUTH  DAILY  . PARoxetine (PAXIL) 30 MG tablet TAKE 1 TABLET BY MOUTH  EVERY MORNING  . pentoxifylline (TRENTAL) 400 MG CR tablet TAKE 1 TABLET BY MOUTH 3  TIMES A DAY WITH MEALS  . pioglitazone (ACTOS) 45 MG tablet TAKE 1 TABLET BY MOUTH  DAILY  . rOPINIRole (REQUIP) 0.5 MG tablet TAKE 1 TABLET BY MOUTH TWO  TIMES DAILY  . rosuvastatin (CRESTOR) 40 MG tablet Take 1 tablet (40 mg total) by mouth daily.  . tadalafil (CIALIS) 5 MG tablet Take one tablet by mouth 30 minutes prior to intercourse.  . vitamin C (ASCORBIC ACID) 500 MG tablet Take 500 mg by mouth daily.   No facility-administered encounter medications on file as of 02/15/2019.     Allergies as of 02/15/2019 - Review Complete 02/15/2019  Allergen Reaction Noted  . Cashew nut oil Anaphylaxis 07/27/2016  . Peanut oil Anaphylaxis 09/09/2016  . Pistachio nut (diagnostic) Hives, Itching, Rash, Shortness Of Breath, and Swelling 07/27/2016  . Betadine [povidone iodine]  05/05/2014  . Latex Dermatitis 05/29/2014  . Penicillins  08/04/2006  . Lidocaine Other (See Comments) 12/24/2014    Past Medical History:  Diagnosis Date  . Angio-edema   . Anxiety   . Depression   . Diabetes mellitus type II    type 2  . GERD (gastroesophageal reflux disease)   . Headache    migraine  . History of kidney stones   .  HLD (hyperlipidemia)   . HTN (hypertension)   . Morbid obesity ([facility])   . Restless legs syndrome (RLS) 11/20/2012  . Tobacco abuse   . Ulcer of left ankle ([facility])    last 2 months dime size dry dressing changing q day  . Urticaria     Past Surgical History:  Procedure Laterality Date  . Arm surgery Right 1969   Abscess excision and debridement at elbow  . COLONOSCOPY WITH PROPOFOL N/A 09/06/2016   Procedure: COLONOSCOPY WITH PROPOFOL;  Surgeon: Doran Stabler, MD;  Location: [facility];  Service: Gastroenterology;  Laterality: N/A;  . I&D EXTREMITY Left 05/29/2014   Procedure: IRRIGATION AND DEBRIDEMENT OF  LEFT LEG WOUND AND PLACEMENT OF  INTEGRA  AND  VAC;  Surgeon: Theodoro Kos, DO;  Location: [facility];  Service: Plastics;  Laterality: Left;  . INCISION AND DRAINAGE OF WOUND Left 04/23/2014   Procedure: IRRIGATION AND DEBRIDEMENT LOWER LEFT LEG WOUND WITH PLACEMENT OF INTEGRA AND VAC;  Surgeon: Theodoro Kos, DO;  Location: [facility];  Service: Plastics;  Laterality: Left;  . kidney stone removal  2004  . MINOR APPLICATION OF WOUND VAC Left 04/23/2014   Procedure: MINOR APPLICATION OF WOUND VAC;  Surgeon: Theodoro Kos, DO;  Location: [facility];  Service: Plastics;  Laterality: Left;  . SKIN SPLIT GRAFT Left 06/18/2014   Procedure: SKIN GRAFT SPLIT THICKNESS TO LOWER LEFT LEG WOUND WITH PLACEMENT OF VAC;  Surgeon: Theodoro Kos, DO;  Location: [facility];  Service: Plastics;  Laterality: Left;  Marland Kitchen VASECTOMY  1994    Family History  Problem Relation Age of Onset  . Stroke Mother        multiple  . Leukemia Mother   . Pneumonia Mother   . Cancer Mother   . Diabetes Mother   . Diabetes Father   . Heart disease Father   . Hyperlipidemia Father   . Hypertension Father   . Allergic rhinitis Neg Hx   . Angioedema Neg Hx   . Asthma Neg Hx   . Eczema Neg Hx   . Immunodeficiency Neg Hx   . Urticaria Neg Hx     Social History   Socioeconomic History  . Marital status: Married    Spouse name: Not on file  . Number of children: 3  . Years of education: Not on file  . Highest education level: Not on file  Occupational History  . 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 Financial resource strain: Not on file  . Food insecurity    Worry: Not on file    Inability: Not on file  . Transportation needs    Medical: Not on file    Non-medical: Not on file  Tobacco Use  . Smoking status: Current Every Day Smoker    Packs/day: 0.25    Years: 40.00    Pack years: 10.00    Types: Cigarettes  .  Smokeless tobacco: Never Used  . Tobacco comment: one cigarette daily  Substance and Sexual Activity  . Alcohol use: Yes    Alcohol/week: 0.0 standard drinks    Comment: rarely  . Drug use: No  . Sexual activity: Yes    Partners: Female  Lifestyle  . Physical activity    Days per week: Not on file    Minutes per session: Not on file  . Stress: Not on file  Relationships  . Social connections    Talks  on phone: Not on file    Gets together: Not on file    Attends religious service: Not on file    Active member of club or organization: Not on file    Attends meetings of clubs or organizations: Not on file    Relationship status: Not on file  . Intimate partner violence    Fear of current or ex partner: Not on file    Emotionally abused: Not on file    Physically abused: Not on file    Forced sexual activity: Not on file  Other Topics Concern  . Not on file  Social History Narrative  . Not on file    Review of Systems  Constitutional: Positive for fatigue.  Respiratory: Positive for apnea. Negative for cough.   Psychiatric/Behavioral: Positive for sleep disturbance.  All other systems reviewed and are negative.   Vitals:   02/15/19 1614 02/15/19 1615  BP:  120/70  Pulse:  90  Temp: (!) 97 F (36.1 C)   SpO2:  96%     Physical Exam  Constitutional: He is oriented to person, place, and time.  obese  HENT:  Head: Normocephalic and atraumatic.  Eyes: Pupils are equal, round, and reactive to light. Right eye exhibits no discharge. Left eye exhibits no discharge.  Neck: Normal range of motion. Neck supple. No tracheal deviation present. No thyromegaly present.  Cardiovascular: Normal rate and regular rhythm.  Pulmonary/Chest: Effort normal and breath sounds normal. No respiratory distress. He has no wheezes. He has no rales.  Abdominal: Soft. Bowel sounds are normal.  Musculoskeletal: Normal range of motion.  Neurological: He is alert and oriented to person, place,  and time. No cranial nerve deficit.  Skin: Skin is warm and dry. No erythema.  Psychiatric: He has a normal mood and affect.     Assessment:  High probability of severe obstructive sleep apnea -Morbid with -Significant daytime symptoms -Thick neck, crowded oropharynx  Pathophysiology of sleep disordered breathing discussed with the patient  Treatment options for sleep disordered breathing discussed with the patient   Morbid obesity -Weight loss efforts discussed  Active smoker -Smoking cessation counseling  Plan/Recommendations:  Home sleep study ordered  Encouraged to continue working on weight loss efforts  Regular exercise encouraged  I will see him back in the office in about 3 months   Sherrilyn Rist MD [facility] 02/15/2019, 4:48 PM  CC: Owens Loffler, MD

## 2019-02-15 NOTE — Patient Instructions (Signed)
High probability of obstructive sleep apnea  We will schedule you for a home sleep study Treatment option will be CPAP  I encourage you to continue working on your weight loss efforts Encourage you to continue to work on quitting smoking  I will see you back in the office in about 3 months  Call with significant concerns Sleep Apnea Sleep apnea is a condition in which breathing pauses or becomes shallow during sleep. Episodes of sleep apnea usually last 10 seconds or longer, and they may occur as many as 20 times an hour. Sleep apnea disrupts your sleep and keeps your body from getting the rest that it needs. This condition can increase your risk of certain health problems, including:  Heart attack.  Stroke.  Obesity.  Diabetes.  Heart failure.  Irregular heartbeat. What are the causes? There are three kinds of sleep apnea:  Obstructive sleep apnea. This kind is caused by a blocked or collapsed airway.  Central sleep apnea. This kind happens when the part of the brain that controls breathing does not send the correct signals to the muscles that control breathing.  Mixed sleep apnea. This is a combination of obstructive and central sleep apnea. The most common cause of this condition is a collapsed or blocked airway. An airway can collapse or become blocked if:  Your throat muscles are abnormally relaxed.  Your tongue and tonsils are larger than normal.  You are overweight.  Your airway is smaller than normal. What increases the risk? You are more likely to develop this condition if you:  Are overweight.  Smoke.  Have a smaller than normal airway.  Are elderly.  Are male.  Drink alcohol.  Take sedatives or tranquilizers.  Have a family history of sleep apnea. What are the signs or symptoms? Symptoms of this condition include:  Trouble staying asleep.  Daytime sleepiness and tiredness.  Irritability.  Loud snoring.  Morning headaches.  Trouble  concentrating.  Forgetfulness.  Decreased interest in sex.  Unexplained sleepiness.  Mood swings.  Personality changes.  Feelings of depression.  Waking up often during the night to urinate.  Dry mouth.  Sore throat. How is this diagnosed? This condition may be diagnosed with:  A medical history.  A physical exam.  A series of tests that are done while you are sleeping (sleep study). These tests are usually done in a sleep lab, but they may also be done at home. How is this treated? Treatment for this condition aims to restore normal breathing and to ease symptoms during sleep. It may involve managing health issues that can affect breathing, such as high blood pressure or obesity. Treatment may include:  Sleeping on your side.  Using a decongestant if you have nasal congestion.  Avoiding the use of depressants, including alcohol, sedatives, and narcotics.  Losing weight if you are overweight.  Making changes to your diet.  Quitting smoking.  Using a device to open your airway while you sleep, such as: ? An oral appliance. This is a custom-made mouthpiece that shifts your lower jaw forward. ? A continuous positive airway pressure (CPAP) device. This device blows air through a mask when you breathe out (exhale). ? A nasal expiratory positive airway pressure (EPAP) device. This device has valves that you put into each nostril. ? A bi-level positive airway pressure (BPAP) device. This device blows air through a mask when you breathe in (inhale) and breathe out (exhale).  Having surgery if other treatments do not work. During surgery,  excess tissue is removed to create a wider airway. It is important to get treatment for sleep apnea. Without treatment, this condition can lead to:  High blood pressure.  Coronary artery disease.  In men, an inability to achieve or maintain an erection (impotence).  Reduced thinking abilities. Follow these instructions at home:  Lifestyle  Make any lifestyle changes that your health care provider recommends.  Eat a healthy, well-balanced diet.  Take steps to lose weight if you are overweight.  Avoid using depressants, including alcohol, sedatives, and narcotics.  Do not use any products that contain nicotine or tobacco, such as cigarettes, e-cigarettes, and chewing tobacco. If you need help quitting, ask your health care provider. General instructions  Take over-the-counter and prescription medicines only as told by your health care provider.  If you were given a device to open your airway while you sleep, use it only as told by your health care provider.  If you are having surgery, make sure to tell your health care provider you have sleep apnea. You may need to bring your device with you.  Keep all follow-up visits as told by your health care provider. This is important. Contact a health care provider if:  The device that you received to open your airway during sleep is uncomfortable or does not seem to be working.  Your symptoms do not improve.  Your symptoms get worse. Get help right away if:  You develop: ? Chest pain. ? Shortness of breath. ? Discomfort in your back, arms, or stomach.  You have: ? Trouble speaking. ? Weakness on one side of your body. ? Drooping in your face. These symptoms may represent a serious problem that is an emergency. Do not wait to see if the symptoms will go away. Get medical help right away. Call your local emergency services (911 in the [place] Do not drive yourself to the hospital. Summary  Sleep apnea is a condition in which breathing pauses or becomes shallow during sleep.  The most common cause is a collapsed or blocked airway.  The goal of treatment is to restore normal breathing and to ease symptoms during sleep. This information is not intended to replace advice given to you by your health care provider. Make sure you discuss any questions you have with  your health care provider. Document Released: 05/06/2002 Document Revised: 03/02/2018 Document Reviewed: 01/09/2018 Elsevier Patient Education  2020 [employer].

## 2019-02-20 ENCOUNTER — Encounter: Payer: Self-pay | Admitting: Family Medicine

## 2019-02-27 ENCOUNTER — Other Ambulatory Visit: Payer: Self-pay

## 2019-02-28 ENCOUNTER — Telehealth: Payer: Self-pay | Admitting: Pulmonary Disease

## 2019-02-28 NOTE — Telephone Encounter (Signed)
Checked with [facility] and she hasn't gotten precert back from [facility] yet.  Called pt & he states he got approval letter yesterday.  I told him I would make our Team Lead who does precerts aware and she can check with them and then one of [place] will be calling him in the next couple of days to schedule hst for him.  Pt states ok.  I made [facility] aware.  Nothing further needed.

## 2019-03-01 ENCOUNTER — Ambulatory Visit (INDEPENDENT_AMBULATORY_CARE_PROVIDER_SITE_OTHER): Payer: Managed Care, Other (non HMO) | Admitting: Endocrinology

## 2019-03-01 ENCOUNTER — Telehealth: Payer: Self-pay | Admitting: Pulmonary Disease

## 2019-03-01 ENCOUNTER — Other Ambulatory Visit: Payer: Self-pay

## 2019-03-01 ENCOUNTER — Encounter: Payer: Self-pay | Admitting: Endocrinology

## 2019-03-01 VITALS — BP 142/74 | HR 97 | Ht 70.0 in | Wt 381.4 lb

## 2019-03-01 DIAGNOSIS — L97929 Non-pressure chronic ulcer of unspecified part of left lower leg with unspecified severity: Secondary | ICD-10-CM | POA: Diagnosis not present

## 2019-03-01 DIAGNOSIS — E11622 Type 2 diabetes mellitus with other skin ulcer: Secondary | ICD-10-CM

## 2019-03-01 MED ORDER — TRULICITY 0.75 MG/0.5ML ~~LOC~~ SOAJ: 0.7500 mg | SUBCUTANEOUS | 11 refills | Status: DC

## 2019-03-01 NOTE — Telephone Encounter (Signed)
Pt aware im going to speak to [facility]

## 2019-03-01 NOTE — Progress Notes (Signed)
Subjective:    Patient ID: [name] [name], male    DOB: 28-Jul-1960, 58 y.o.   MRN: [id number]  HPI Wife provides most of hx, due to pt's difficulty with concentration.  pt is referred by for diabetes.  Pt states DM was dx'ed in 2010; he has severe neuropathy of the lower extremities, and assoc pain;  He also has leg and foot ulcers; he has never been on insulin; pt says his diet and exercise are fair; he has never had pancreatitis, pancreatic surgery, or DKA.  He had severe hypoglycemia once, in 2016.  He takes 3 oral meds.  He says cbg varies from 178-275.  It is in general higher as the day goes on.  Wife is nurse, and pt has also administered insulin to his father.  He stopped Victoza, due to lack of effect.   Past Medical History:  Diagnosis Date  . Angio-edema   . Anxiety   . Depression   . Diabetes mellitus type II    type 2  . GERD (gastroesophageal reflux disease)   . Headache    migraine  . History of kidney stones   . HLD (hyperlipidemia)   . HTN (hypertension)   . Morbid obesity ([place])   . Restless legs syndrome (RLS) 11/20/2012  . Tobacco abuse   . Ulcer of left ankle (HCC)    last 2 months dime size dry dressing changing q day  . Urticaria     Past Surgical History:  Procedure Laterality Date  . Arm surgery Right 1969   Abscess excision and debridement at elbow  . COLONOSCOPY WITH PROPOFOL N/A 09/06/2016   Procedure: COLONOSCOPY WITH PROPOFOL;  Surgeon: Doran Stabler, MD;  Location: [facility];  Service: Gastroenterology;  Laterality: N/A;  . I&D EXTREMITY Left 05/29/2014   Procedure: IRRIGATION AND DEBRIDEMENT OF LEFT LEG WOUND AND PLACEMENT OF  INTEGRA  AND  VAC;  Surgeon: Theodoro Kos, DO;  Location: [place];  Service: Plastics;  Laterality: Left;  . INCISION AND DRAINAGE OF WOUND Left 04/23/2014   Procedure: IRRIGATION AND DEBRIDEMENT LOWER LEFT LEG WOUND WITH PLACEMENT OF INTEGRA AND VAC;  Surgeon: Theodoro Kos, DO;  Location: [place];  Service: Plastics;  Laterality: Left;  . kidney stone removal  2004  . MINOR APPLICATION OF WOUND VAC Left 04/23/2014   Procedure: MINOR APPLICATION OF WOUND VAC;  Surgeon: Theodoro Kos, DO;  Location: [place];  Service: Plastics;  Laterality: Left;  . SKIN SPLIT GRAFT Left 06/18/2014   Procedure: SKIN GRAFT SPLIT THICKNESS TO LOWER LEFT LEG WOUND WITH PLACEMENT OF VAC;  Surgeon: Theodoro Kos, DO;  Location: [place];  Service: Plastics;  Laterality: Left;  Marland Kitchen VASECTOMY  1994    Social History   Socioeconomic History  . Marital status: Married    Spouse name: Not on file  . Number of children: 3  . Years of education: Not on file  . Highest education level: Not on file  Occupational History  . 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 Financial resource strain: Not on file  . Food insecurity    Worry: Not on file    Inability: Not on file  . Transportation needs    Medical: Not on file    Non-medical: Not on file  Tobacco Use  . Smoking status: Current Every Day Smoker    Packs/day: 0.25    Years: 40.00    Pack  years: 10.00    Types: Cigarettes  . Smokeless tobacco: Never Used  . Tobacco comment: one cigarette daily  Substance and Sexual Activity  . Alcohol use: Yes    Alcohol/week: 0.0 standard drinks    Comment: rarely  . Drug use: No  . Sexual activity: Yes    Partners: Female  Lifestyle  . Physical activity    Days per week: Not on file    Minutes per session: Not on file  . Stress: Not on file  Relationships  . Social Herbalist on phone: Not on file    Gets together: Not on file    Attends religious service: Not on file    Active member of club or organization: Not on file    Attends meetings of clubs or organizations: Not on file    Relationship status: Not on file  . Intimate partner violence    Fear of current or ex partner: Not on file    Emotionally abused: Not on  file    Physically abused: Not on file    Forced sexual activity: Not on file  Other Topics Concern  . Not on file  Social History Narrative  . Not on file    Current Outpatient Medications on File Prior to Visit  Medication Sig Dispense Refill  . Blood Glucose Monitoring Suppl (ONETOUCH VERIO FLEX SYSTEM) w/Device KIT 1 each by Does not apply route 2 (two) times daily. Dx: E11.622/L97.929 1 kit 0  . EPINEPHrine 0.3 mg/0.3 mL IJ SOAJ injection INJECT 0.3 ML INTO THE  MUSCLE ONCE FOR 1 DOSE. 2 Device 0  . gabapentin (NEURONTIN) 300 MG capsule TAKE 3 CAPSULES BY MOUTH 3  TIMES DAILY 810 capsule 1  . glipiZIDE (GLUCOTROL XL) 10 MG 24 hr tablet TAKE 2 TABLETS BY MOUTH  DAILY WITH BREAKFAST 180 tablet 1  . glucose blood (ONETOUCH VERIO) test strip USE TO CHECK BLOOD SUGAR TWO TIMES A DAY 200 each 3  . lisinopril-hydrochlorothiazide (ZESTORETIC) 20-12.5 MG tablet TAKE 2 TABLETS BY MOUTH  DAILY 180 tablet 1  . meloxicam (MOBIC) 15 MG tablet TAKE 1 TABLET BY MOUTH  EVERY MORNING 90 tablet 1  . metFORMIN (GLUCOPHAGE-XR) 500 MG 24 hr tablet TAKE 4 TABLETS BY MOUTH  DAILY WITH BREAKFAST 360 tablet 1  . Multiple Vitamin (MULTIVITAMIN) tablet Take 1 tablet by mouth daily.      [name] Delica Lancets 14E MISC 1 each by Does not apply route 2 (two) times daily. E11.622/L97.929 200 each 3  . pantoprazole (PROTONIX) 40 MG tablet TAKE 1 TABLET BY MOUTH  DAILY 90 tablet 1  . PARoxetine (PAXIL) 30 MG tablet TAKE 1 TABLET BY MOUTH  EVERY MORNING 90 tablet 1  . pentoxifylline (TRENTAL) 400 MG CR tablet TAKE 1 TABLET BY MOUTH 3  TIMES A DAY WITH MEALS 270 tablet 1  . rOPINIRole (REQUIP) 0.5 MG tablet TAKE 1 TABLET BY MOUTH TWO  TIMES DAILY 180 tablet 1  . rosuvastatin (CRESTOR) 40 MG tablet Take 1 tablet (40 mg total) by mouth daily. 90 tablet 3  . tadalafil (CIALIS) 5 MG tablet Take one tablet by mouth 30 minutes prior to intercourse. 15 tablet 11  . vitamin C (ASCORBIC ACID) 500 MG tablet Take 500 mg by  mouth daily.     No current facility-administered medications on file prior to visit.     Allergies  Allergen Reactions  . Cashew Nut Oil Anaphylaxis    Cashews, 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  Marland Kitchen  Peanut Oil Anaphylaxis  . Pistachio Nut (Diagnostic) Hives, Itching, Rash, Shortness Of Breath and Swelling  . Betadine [Povidone Iodine]     Raised rash 04/23/14  . Latex Dermatitis  . Penicillins     REACTION: rash all over as child  . Lidocaine Other (See Comments)    Minimal effect with lidocaine, prefers different anesthetic    Family History  Problem Relation Age of Onset  . Stroke Mother        multiple  . Leukemia Mother   . Pneumonia Mother   . Cancer Mother   . Diabetes Mother   . Diabetes Father   . Heart disease Father   . Hyperlipidemia Father   . Hypertension Father   . Allergic rhinitis Neg Hx   . Angioedema Neg Hx   . Asthma Neg Hx   . Eczema Neg Hx   . Immunodeficiency Neg Hx   . Urticaria Neg Hx     BP (!) 142/74 (BP Location: Left Wrist, Patient Position: Sitting, Cuff Size: Large)   Pulse 97   Ht '5\' 10"'$  (1.778 m)   Wt (!) 381 lb 6.4 oz (173 kg)   SpO2 95%   BMI 54.73 kg/m   Review of Systems denies blurry vision, chest pain, n/v, urinary frequency, muscle cramps, excessive diaphoresis, memory loss, depression, cold intolerance, and rhinorrhea.  He has lost 8 lbs in the past month--intentional.  He has chronic anxiety, insomnia, headache, doe, easy bruising, difficulty with concentration, and left hip pain.        Objective:   Physical Exam VS: see vs page GEN: no distress HEAD: head: no deformity eyes: no periorbital swelling, no proptosis external nose and ears are normal NECK: supple, thyroid is not enlarged CHEST WALL: no deformity LUNGS: clear to auscultation CV: reg rate and rhythm, no murmur ABD: abdomen is soft, nontender.  no hepatosplenomegaly.  not distended.  no hernia MUSCULOSKELETAL: muscle bulk and strength are grossly normal.  no  obvious joint swelling.  gait is normal and steady EXTEMITIES: no deformity.  no ulcer on the feet.  feet are of normal color and temp.  1+ bilat leg edema, and bilat vv's PULSES: dorsalis pedis intact bilat.  no carotid bruit NEURO:  cn 2-12 grossly intact.   readily moves all 4's.  sensation is severely decreased to touch on the feet SKIN:  Normal texture and temperature.  No rash or suspicious lesion is visible.  Healed ulcers at the left leg.  (chronic) erythema of the legs. NODES:  None palpable at the neck PSYCH: alert, well-oriented.  Does not appear anxious nor depressed.  Lab Results  Component Value Date   HGBA1C 10.7 (A) 02/06/2019   Lab Results  Component Value Date   CREATININE 1.05 06/11/2018   BUN 14 06/11/2018   NA 139 06/11/2018   K 4.8 06/11/2018   CL 99 06/11/2018   CO2 24 06/11/2018   I personally reviewed electrocardiogram tracing (06/06/16): Indication: hypotension Impression: NSR.  Poss old MI.  RAE.  RVH Compared to 04/22/14: ST is resolved     Assessment & Plan:  type 2 DM: severe exacerbation Obesity: we discussed.  he declines surgery Edema: cause or exac by piotlitazone HTN: is noted today   Patient Instructions  Your blood pressure is high today.  Please see your primary care provider soon, to have it rechecked good diet and exercise significantly improve the control of your diabetes.  please let me know if you wish to be  referred to a dietician.  high blood sugar is very risky to your health.  you should see an eye doctor and dentist every year.  It is very important to get all recommended vaccinations.  Controlling your blood pressure and cholesterol drastically reduces the damage diabetes does to your body.  Those who smoke should quit.  Please discuss these with your doctor.  check your blood sugar once a day.  vary the time of day when you check, between before the 3 meals, and at bedtime.  also check if you have symptoms of your blood sugar being  too high or too low.  please keep a record of the readings and bring it to your next appointment here (or you can bring the meter itself).  You can write it on any piece of paper.  please call us sooner if your blood sugar goes below 70, or if you have a lot of readings over 200.   You should stop taking the pioglitazone, and: I have sent a prescription to your pharmacy, to add "Trulicity." Please call or message [place] next week, to tell us how the blood sugar is doing If it is still high, we can double this. Then please call or message [place] in another week, to tell us how the blood sugar is doing. If it is still high, we'll then add "[place]."  Please come back for a follow-up appointment in 1 month.

## 2019-03-01 NOTE — Patient Instructions (Addendum)
Your blood pressure is high today.  Please see your primary care provider soon, to have it rechecked good diet and exercise significantly improve the control of your diabetes.  please let me know if you wish to be referred to a dietician.  high blood sugar is very risky to your health.  you should see an eye doctor and dentist every year.  It is very important to get all recommended vaccinations.  Controlling your blood pressure and cholesterol drastically reduces the damage diabetes does to your body.  Those who smoke should quit.  Please discuss these with your doctor.  check your blood sugar once a day.  vary the time of day when you check, between before the 3 meals, and at bedtime.  also check if you have symptoms of your blood sugar being too high or too low.  please keep a record of the readings and bring it to your next appointment here (or you can bring the meter itself).  You can write it on any piece of paper.  please call us sooner if your blood sugar goes below 70, or if you have a lot of readings over 200.   You should stop taking the pioglitazone, and: I have sent a prescription to your pharmacy, to add "Trulicity." Please call or message [place] next week, to tell us how the blood sugar is doing If it is still high, we can double this. Then please call or message [place] in another week, to tell us how the blood sugar is doing. If it is still high, we'll then add "[place]."  Please come back for a follow-up appointment in 1 month.

## 2019-03-01 NOTE — Telephone Encounter (Signed)
This pt HST has not been approved yet still waiting for [employer]'s response

## 2019-03-07 ENCOUNTER — Encounter: Payer: Self-pay | Admitting: Endocrinology

## 2019-03-08 ENCOUNTER — Other Ambulatory Visit: Payer: Self-pay | Admitting: Endocrinology

## 2019-03-08 MED ORDER — TRULICITY 1.5 MG/0.5ML ~~LOC~~ SOAJ: 1.5000 mg | SUBCUTANEOUS | 3 refills | Status: DC

## 2019-03-08 NOTE — Telephone Encounter (Signed)
Please advise 

## 2019-03-12 ENCOUNTER — Telehealth: Payer: Self-pay

## 2019-03-12 NOTE — Telephone Encounter (Signed)
Company: [facility]  Document: Clarification request - Victoza and Trulicity Other records requested: None  All above requested information has been faxed successfully to [employer] listed above. Documents and fax confirmation have been placed in the faxed file for future reference.

## 2019-03-14 ENCOUNTER — Encounter: Payer: Self-pay | Admitting: Endocrinology

## 2019-03-15 ENCOUNTER — Other Ambulatory Visit: Payer: Self-pay | Admitting: Endocrinology

## 2019-03-15 ENCOUNTER — Telehealth: Payer: Self-pay | Admitting: Endocrinology

## 2019-03-15 ENCOUNTER — Other Ambulatory Visit: Payer: Self-pay

## 2019-03-15 DIAGNOSIS — L97929 Non-pressure chronic ulcer of unspecified part of left lower leg with unspecified severity: Secondary | ICD-10-CM

## 2019-03-15 DIAGNOSIS — E11622 Type 2 diabetes mellitus with other skin ulcer: Secondary | ICD-10-CM

## 2019-03-15 MED ORDER — FARXIGA 10 MG PO TABS: 10.0000 mg | ORAL_TABLET | Freq: Every day | ORAL | 11 refills | Status: DC

## 2019-03-15 MED ORDER — TRULICITY 1.5 MG/0.5ML ~~LOC~~ SOAJ: 1.5000 mg | SUBCUTANEOUS | 3 refills | Status: DC

## 2019-03-15 NOTE — Telephone Encounter (Signed)
Pharmacy:  [facility], [place] - [address] AT [address] dapagliflozin propanediol (FARXIGA) 10 MG TABS tablet 30 tablet 11 03/15/2019    Sig - Route: Take 10 mg by mouth daily before breakfast. - Oral   Sent to pharmacy as: dapagliflozin propanediol (FARXIGA) 10 MG Tab tablet   E-Prescribing Status: Receipt confirmed by pharmacy (03/15/2019 10:35 AM EDT)    Dulaglutide (TRULICITY) 1.5 IW/8.0HO SOPN 12 pen 3 03/15/2019    Sig - Route: Inject 1.5 mg into the skin once a week. - Subcutaneous   Sent to pharmacy as: Dulaglutide (TRULICITY) 1.5 ZY/2.4MG Solution Pen-injector   E-Prescribing Status: Receipt confirmed by pharmacy (03/15/2019 10:35 AM EDT)

## 2019-03-15 NOTE — Telephone Encounter (Signed)
Patient called to request that the [place] and the Trulicity be sent to the [facility] on [address] instead of [facility]

## 2019-03-15 NOTE — Telephone Encounter (Signed)
Please advise 

## 2019-03-21 ENCOUNTER — Encounter: Payer: Self-pay | Admitting: Endocrinology

## 2019-03-22 ENCOUNTER — Other Ambulatory Visit: Payer: Self-pay

## 2019-03-22 ENCOUNTER — Ambulatory Visit: Payer: Managed Care, Other (non HMO)

## 2019-03-22 DIAGNOSIS — G4733 Obstructive sleep apnea (adult) (pediatric): Secondary | ICD-10-CM

## 2019-03-24 DIAGNOSIS — G4733 Obstructive sleep apnea (adult) (pediatric): Secondary | ICD-10-CM

## 2019-03-26 DIAGNOSIS — G4733 Obstructive sleep apnea (adult) (pediatric): Secondary | ICD-10-CM

## 2019-03-28 ENCOUNTER — Encounter: Payer: Self-pay | Admitting: Family Medicine

## 2019-03-28 MED ORDER — CHANTIX STARTING MONTH PAK 0.5 MG X 11 & 1 MG X 42 PO TABS: ORAL_TABLET | ORAL | 0 refills | Status: DC

## 2019-03-28 MED ORDER — VARENICLINE TARTRATE 1 MG PO TABS: 1.0000 mg | ORAL_TABLET | Freq: Two times a day (BID) | ORAL | 1 refills | Status: DC

## 2019-04-01 ENCOUNTER — Other Ambulatory Visit: Payer: Self-pay

## 2019-04-01 DIAGNOSIS — Z20822 Contact with and (suspected) exposure to covid-19: Secondary | ICD-10-CM

## 2019-04-02 LAB — NOVEL CORONAVIRUS, NAA: SARS-CoV-2, NAA: NOT DETECTED

## 2019-04-03 ENCOUNTER — Telehealth: Payer: Self-pay

## 2019-04-03 DIAGNOSIS — G4733 Obstructive sleep apnea (adult) (pediatric): Secondary | ICD-10-CM

## 2019-04-03 NOTE — Telephone Encounter (Signed)
I called and spoke with the patient in regards to his home sleep study results and made him aware that his study shows severe OSA and an auto CPAP of 5-20 is appropriate. I advised him that I will be placing an order and he will hear from someone soon. He verbalized understanding.

## 2019-04-07 ENCOUNTER — Other Ambulatory Visit: Payer: Self-pay | Admitting: Family Medicine

## 2019-04-10 ENCOUNTER — Other Ambulatory Visit: Payer: Self-pay

## 2019-04-10 ENCOUNTER — Telehealth: Payer: Self-pay | Admitting: Pulmonary Disease

## 2019-04-11 NOTE — Telephone Encounter (Signed)
I will close this message looks like Janett Billow has spoken to this patient

## 2019-04-11 NOTE — Telephone Encounter (Signed)
Patient was returning call and was transferred to be by front office staff.  Spoke with patient who reported that he was calling for an update on the CPAP order that was sent on last week 11.6.2020.  Advised patient will research this and call him back.  Patient stated that he was about to go back to work and advised that a detailed message may be left on [phone].  Called [employer] and spoke with Dimas Chyle (ext [phone]) who reported that she just received the order from [place] yesterday 04/10/2019.  She has sent the order to the department that schedules patients.  However, patient does have [employer], and the order must be sent to a 3rd party company for authorization and this can delay the process even more.  Called and left a detailed msg on patient's VM as indicated above.  Advised patient will leave message so that it may be followed up on.

## 2019-04-12 ENCOUNTER — Encounter: Payer: Self-pay | Admitting: Endocrinology

## 2019-04-12 ENCOUNTER — Ambulatory Visit (INDEPENDENT_AMBULATORY_CARE_PROVIDER_SITE_OTHER): Payer: Managed Care, Other (non HMO) | Admitting: Endocrinology

## 2019-04-12 VITALS — BP 126/74 | HR 87 | Ht 70.0 in | Wt 369.0 lb

## 2019-04-12 DIAGNOSIS — E11622 Type 2 diabetes mellitus with other skin ulcer: Secondary | ICD-10-CM

## 2019-04-12 DIAGNOSIS — L97929 Non-pressure chronic ulcer of unspecified part of left lower leg with unspecified severity: Secondary | ICD-10-CM

## 2019-04-12 LAB — POCT GLYCOSYLATED HEMOGLOBIN (HGB A1C): Hemoglobin A1C: 9.8 % — AB (ref 4.0–5.6)

## 2019-04-12 MED ORDER — TRULICITY 3 MG/0.5ML ~~LOC~~ SOAJ: 3.0000 mg | SUBCUTANEOUS | 3 refills | Status: DC

## 2019-04-12 NOTE — Progress Notes (Signed)
Subjective:    Patient ID: [name] [name], male    DOB: February 17, 1961, 57 y.o.   MRN: [id number]  HPI Pt returns for f/u of diabetes mellitus: DM type: 2 Dx'ed: 9381 Complications: PN, renal insuff, and foot ulcers Therapy: trulicity and 3 oral meds. DKA: never Severe hypoglycemia: once, in 2016 Pancreatitis: never Pancreatic imaging: never Other: he has never been on insulin; wife is nurse, and pt has also administered insulin to his father; wife provides most of hx, due to pt's difficulty with concentration Interval history: He has been on [place] for just 2 weeks.  He brings his meter with his cbg's which I have reviewed today. cbg varies from 121-279 Past Medical History:  Diagnosis Date  . Angio-edema   . Anxiety   . Depression   . Diabetes mellitus type II    type 2  . GERD (gastroesophageal reflux disease)   . Headache    migraine  . History of kidney stones   . HLD (hyperlipidemia)   . HTN (hypertension)   . Morbid obesity ([place])   . Restless legs syndrome (RLS) 11/20/2012  . Tobacco abuse   . Ulcer of left ankle (HCC)    last 2 months dime size dry dressing changing q day  . Urticaria     Past Surgical History:  Procedure Laterality Date  . Arm surgery Right 1969   Abscess excision and debridement at elbow  . COLONOSCOPY WITH PROPOFOL N/A 09/06/2016   Procedure: COLONOSCOPY WITH PROPOFOL;  Surgeon: Doran Stabler, MD;  Location: [facility];  Service: Gastroenterology;  Laterality: N/A;  . I&D EXTREMITY Left 05/29/2014   Procedure: IRRIGATION AND DEBRIDEMENT OF LEFT LEG WOUND AND PLACEMENT OF  INTEGRA  AND  VAC;  Surgeon: Theodoro Kos, DO;  Location: [place];  Service: Plastics;  Laterality: Left;  . INCISION AND DRAINAGE OF WOUND Left 04/23/2014   Procedure: IRRIGATION AND DEBRIDEMENT LOWER LEFT LEG WOUND WITH PLACEMENT OF INTEGRA AND VAC;  Surgeon: Theodoro Kos, DO;  Location: [place];  Service: Plastics;  Laterality:  Left;  . kidney stone removal  2004  . MINOR APPLICATION OF WOUND VAC Left 04/23/2014   Procedure: MINOR APPLICATION OF WOUND VAC;  Surgeon: Theodoro Kos, DO;  Location: [place];  Service: Plastics;  Laterality: Left;  . SKIN SPLIT GRAFT Left 06/18/2014   Procedure: SKIN GRAFT SPLIT THICKNESS TO LOWER LEFT LEG WOUND WITH PLACEMENT OF VAC;  Surgeon: Theodoro Kos, DO;  Location: [place];  Service: Plastics;  Laterality: Left;  Marland Kitchen VASECTOMY  1994    Social History   Socioeconomic History  . Marital status: Married    Spouse name: Not on file  . Number of children: 3  . Years of education: Not on file  . Highest education level: Not on file  Occupational History  . 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 Financial resource strain: Not on file  . Food insecurity    Worry: Not on file    Inability: Not on file  . Transportation needs    Medical: Not on file    Non-medical: Not on file  Tobacco Use  . Smoking status: Current Every Day Smoker    Packs/day: 0.25    Years: 40.00    Pack years: 10.00    Types: Cigarettes  . Smokeless tobacco: Never Used  . Tobacco comment: one cigarette daily  Substance and Sexual Activity  . Alcohol use:  Yes    Alcohol/week: 0.0 standard drinks    Comment: rarely  . Drug use: No  . Sexual activity: Yes    Partners: Female  Lifestyle  . Physical activity    Days per week: Not on file    Minutes per session: Not on file  . Stress: Not on file  Relationships  . Social Herbalist on phone: Not on file    Gets together: Not on file    Attends religious service: Not on file    Active member of club or organization: Not on file    Attends meetings of clubs or organizations: Not on file    Relationship status: Not on file  . Intimate partner violence    Fear of current or ex partner: Not on file    Emotionally abused: Not on file    Physically abused: Not on file    Forced  sexual activity: Not on file  Other Topics Concern  . Not on file  Social History Narrative  . Not on file    Current Outpatient Medications on File Prior to Visit  Medication Sig Dispense Refill  . Blood Glucose Monitoring Suppl (ONETOUCH VERIO FLEX SYSTEM) w/Device KIT 1 each by Does not apply route 2 (two) times daily. Dx: E11.622/L97.929 1 kit 0  . dapagliflozin propanediol (FARXIGA) 10 MG TABS tablet Take 10 mg by mouth daily before breakfast. 30 tablet 11  . EPINEPHrine 0.3 mg/0.3 mL IJ SOAJ injection INJECT 0.3 ML INTO THE  MUSCLE ONCE FOR 1 DOSE. 2 Device 0  . gabapentin (NEURONTIN) 300 MG capsule TAKE 3 CAPSULES BY MOUTH 3  TIMES DAILY 810 capsule 1  . glipiZIDE (GLUCOTROL XL) 10 MG 24 hr tablet TAKE 2 TABLETS BY MOUTH  DAILY WITH BREAKFAST 180 tablet 1  . glucose blood (ONETOUCH VERIO) test strip USE TO CHECK BLOOD SUGAR TWO TIMES A DAY 200 each 3  . lisinopril-hydrochlorothiazide (ZESTORETIC) 20-12.5 MG tablet TAKE 2 TABLETS BY MOUTH  DAILY 180 tablet 1  . meloxicam (MOBIC) 15 MG tablet TAKE 1 TABLET BY MOUTH  EVERY MORNING 90 tablet 1  . metFORMIN (GLUCOPHAGE-XR) 500 MG 24 hr tablet TAKE 4 TABLETS BY MOUTH  DAILY WITH BREAKFAST 360 tablet 1  . Multiple Vitamin (MULTIVITAMIN) tablet Take 1 tablet by mouth daily.      [name] Delica Lancets 24P MISC 1 each by Does not apply route 2 (two) times daily. E11.622/L97.929 200 each 3  . pantoprazole (PROTONIX) 40 MG tablet TAKE 1 TABLET BY MOUTH  DAILY 90 tablet 1  . PARoxetine (PAXIL) 30 MG tablet TAKE 1 TABLET BY MOUTH  EVERY MORNING 90 tablet 1  . pentoxifylline (TRENTAL) 400 MG CR tablet TAKE 1 TABLET BY MOUTH 3  TIMES A DAY WITH MEALS 270 tablet 1  . rOPINIRole (REQUIP) 0.5 MG tablet TAKE 1 TABLET BY MOUTH TWO  TIMES DAILY 180 tablet 1  . rosuvastatin (CRESTOR) 40 MG tablet TAKE 1 TABLET BY MOUTH  DAILY 90 tablet 0  . tadalafil (CIALIS) 5 MG tablet Take one tablet by mouth 30 minutes prior to intercourse. 15 tablet 11  .  varenicline (CHANTIX) 1 MG tablet Take 1 tablet (1 mg total) by mouth 2 (two) times daily. 180 tablet 1  . vitamin C (ASCORBIC ACID) 500 MG tablet Take 500 mg by mouth daily.     No current facility-administered medications on file prior to visit.     Allergies  Allergen Reactions  .  Cashew Nut Oil Anaphylaxis    Cashews, 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  Marland Kitchen Peanut Oil Anaphylaxis  . Pistachio Nut (Diagnostic) Hives, Itching, Rash, Shortness Of Breath and Swelling  . Betadine [Povidone Iodine]     Raised rash 04/23/14  . Latex Dermatitis  . Penicillins     REACTION: rash all over as child  . Lidocaine Other (See Comments)    Minimal effect with lidocaine, prefers different anesthetic    Family History  Problem Relation Age of Onset  . Stroke Mother        multiple  . Leukemia Mother   . Pneumonia Mother   . Cancer Mother   . Diabetes Mother   . Diabetes Father   . Heart disease Father   . Hyperlipidemia Father   . Hypertension Father   . Allergic rhinitis Neg Hx   . Angioedema Neg Hx   . Asthma Neg Hx   . Eczema Neg Hx   . Immunodeficiency Neg Hx   . Urticaria Neg Hx     BP 126/74 (BP Location: Left Arm, Patient Position: Sitting, Cuff Size: Large)   Pulse 87   Ht '5\' 10"'$  (1.778 m)   Wt (!) 369 lb (167.4 kg)   SpO2 96%   BMI 52.95 kg/m   Review of Systems He has lost weight, due to his efforts.  Edema is improved    Objective:   Physical Exam VITAL SIGNS:  See vs page GENERAL: no distress Pulses: dorsalis pedis intact bilat.   MSK: no deformity of the feet CV: trace bilat leg edema, and bilat vv's Skin: healed ulcers on the feet and legs.  normal color and temp on the feet, but there is (chronic) erythema of the feet Neuro: sensation is intact to touch on the feet, but severely decreased from normal  Lab Results  Component Value Date   HGBA1C 9.8 (A) 04/12/2019   Lab Results  Component Value Date   CREATININE 1.31 (H) 04/12/2019   BUN 25 (H) 04/12/2019   NA  141 04/12/2019   K 4.9 04/12/2019   CL 104 04/12/2019   CO2 23 04/12/2019      Assessment & Plan:  Type 2 DM, with foot ulcers: worse Edema, improved: This limits rx options.  Renal insuff: This also limits rx options  Patient Instructions  I have sent a prescription to your pharmacy, to increase the Trulicity, and: Please continue the same other diabetes medications. check your blood sugar once a day.  vary the time of day when you check, between before the 3 meals, and at bedtime.  also check if you have symptoms of your blood sugar being too high or too low.  please keep a record of the readings and bring it to your next appointment here (or you can bring the meter itself).  You can write it on any piece of paper.  please call us sooner if your blood sugar goes below 70, or if you have a lot of readings over 200. Blood tests are requested for you today.  We'll let you know about the results.  Please come back for a follow-up appointment in 2 months.

## 2019-04-12 NOTE — Patient Instructions (Signed)
I have sent a prescription to your pharmacy, to increase the Trulicity, and: Please continue the same other diabetes medications. check your blood sugar once a day.  vary the time of day when you check, between before the 3 meals, and at bedtime.  also check if you have symptoms of your blood sugar being too high or too low.  please keep a record of the readings and bring it to your next appointment here (or you can bring the meter itself).  You can write it on any piece of paper.  please call us sooner if your blood sugar goes below 70, or if you have a lot of readings over 200. Blood tests are requested for you today.  We'll let you know about the results.  Please come back for a follow-up appointment in 2 months.

## 2019-04-13 LAB — BASIC METABOLIC PANEL
BUN/Creatinine Ratio: 19 (ref 9–20)
BUN: 25 mg/dL — ABNORMAL HIGH (ref 6–24)
CO2: 23 mmol/L (ref 20–29)
Calcium: 10 mg/dL (ref 8.7–10.2)
Chloride: 104 mmol/L (ref 96–106)
Creatinine, Ser: 1.31 mg/dL — ABNORMAL HIGH (ref 0.76–1.27)
GFR calc Af Amer: 69 mL/min/{1.73_m2} (ref >59)
GFR calc non Af Amer: 60 mL/min/{1.73_m2} (ref >59)
Glucose: 131 mg/dL — ABNORMAL HIGH (ref 65–99)
Potassium: 4.9 mmol/L (ref 3.5–5.2)
Sodium: 141 mmol/L (ref 134–144)

## 2019-04-13 LAB — TSH: TSH: 0.984 u[IU]/mL (ref 0.450–4.500)

## 2019-04-15 ENCOUNTER — Telehealth: Payer: Self-pay

## 2019-04-15 NOTE — Telephone Encounter (Signed)
PA initiated today through [employer] My Med[employer] for Trulicity. Will await insurance response re: approval/denial.  Allayne Gitelman Key: [id number] - PA Case ID: [id number] - Rx #: [id number] Need help? Call us at [phone] Status Sent to [facility] Trulicity 3MG /0.5ML pen-injectors Form [facility] Electronic Prior Authorization Form (2017 NCPDP) Original Claim Info 59

## 2019-04-15 NOTE — Telephone Encounter (Signed)
Received notification from [facility] that PA for Trulicity has been approved 04/15/19 through 04/14/2020. Document has been labeled and placed in scan file for HIM and for our future reference.

## 2019-04-25 ENCOUNTER — Encounter: Payer: Self-pay | Admitting: Endocrinology

## 2019-04-29 ENCOUNTER — Other Ambulatory Visit: Payer: Self-pay | Admitting: Endocrinology

## 2019-04-29 MED ORDER — TRULICITY 4.5 MG/0.5ML ~~LOC~~ SOAJ: 4.5000 mg | SUBCUTANEOUS | 3 refills | Status: DC

## 2019-05-23 ENCOUNTER — Ambulatory Visit: Payer: Managed Care, Other (non HMO) | Attending: Internal Medicine

## 2019-05-23 DIAGNOSIS — Z20822 Contact with and (suspected) exposure to covid-19: Secondary | ICD-10-CM

## 2019-05-24 LAB — NOVEL CORONAVIRUS, NAA: SARS-CoV-2, NAA: NOT DETECTED

## 2019-05-28 ENCOUNTER — Encounter: Payer: Self-pay | Admitting: Family Medicine

## 2019-05-29 MED ORDER — METFORMIN HCL ER 500 MG PO TB24: ORAL_TABLET | ORAL | 0 refills | Status: DC

## 2019-06-15 ENCOUNTER — Encounter: Payer: Self-pay | Admitting: Family Medicine

## 2019-06-15 DIAGNOSIS — E11622 Type 2 diabetes mellitus with other skin ulcer: Secondary | ICD-10-CM

## 2019-06-17 MED ORDER — LANCETS MICRO THIN 33G MISC: 5 refills | Status: DC

## 2019-06-17 MED ORDER — CONTOUR NEXT TEST VI STRP: ORAL_STRIP | 5 refills | Status: DC

## 2019-06-21 ENCOUNTER — Ambulatory Visit: Payer: Managed Care, Other (non HMO) | Admitting: Endocrinology

## 2019-07-01 ENCOUNTER — Other Ambulatory Visit: Payer: Self-pay

## 2019-07-01 ENCOUNTER — Encounter: Payer: Self-pay | Admitting: Endocrinology

## 2019-07-01 DIAGNOSIS — E11622 Type 2 diabetes mellitus with other skin ulcer: Secondary | ICD-10-CM

## 2019-07-01 DIAGNOSIS — L97929 Non-pressure chronic ulcer of unspecified part of left lower leg with unspecified severity: Secondary | ICD-10-CM

## 2019-07-01 MED ORDER — TRULICITY 4.5 MG/0.5ML ~~LOC~~ SOAJ: 4.5000 mg | SUBCUTANEOUS | 3 refills | Status: DC

## 2019-07-03 ENCOUNTER — Other Ambulatory Visit: Payer: Self-pay

## 2019-07-05 ENCOUNTER — Other Ambulatory Visit: Payer: Self-pay

## 2019-07-05 ENCOUNTER — Ambulatory Visit (INDEPENDENT_AMBULATORY_CARE_PROVIDER_SITE_OTHER): Payer: Managed Care, Other (non HMO) | Admitting: Endocrinology

## 2019-07-05 ENCOUNTER — Encounter: Payer: Self-pay | Admitting: Endocrinology

## 2019-07-05 VITALS — BP 142/72 | HR 95 | Ht 70.0 in | Wt 352.6 lb

## 2019-07-05 DIAGNOSIS — L97929 Non-pressure chronic ulcer of unspecified part of left lower leg with unspecified severity: Secondary | ICD-10-CM

## 2019-07-05 DIAGNOSIS — E11622 Type 2 diabetes mellitus with other skin ulcer: Secondary | ICD-10-CM | POA: Diagnosis not present

## 2019-07-05 LAB — POCT GLYCOSYLATED HEMOGLOBIN (HGB A1C): Hemoglobin A1C: 8.9 % — AB (ref 4.0–5.6)

## 2019-07-05 MED ORDER — COLESEVELAM HCL 625 MG PO TABS: 1875.0000 mg | ORAL_TABLET | Freq: Every day | ORAL | 11 refills | Status: DC

## 2019-07-05 NOTE — Progress Notes (Signed)
Subjective:    Patient ID: [name] [name], male    DOB: 04/02/1961, 59 y.o.   MRN: [id number]  HPI Pt returns for f/u of diabetes mellitus: DM type: 2 Dx'ed: 1749 Complications: PN, renal insuff, and foot ulcers Therapy: trulicity and 3 oral meds. DKA: never Severe hypoglycemia: once, in 2016 Pancreatitis: never Pancreatic imaging: never SDOH: wife provides most of hx, due to pt's difficulty with concentration Other: he has never been on insulin; wife is nurse, and pt has also administered insulin to his father; edema limits rx options.  Interval history: no cbg record, but states cbg varies from 130-200.  Pt is here alone today.  Pt says he does not meds.   Past Medical History:  Diagnosis Date  . Angio-edema   . Anxiety   . Depression   . Diabetes mellitus type II    type 2  . GERD (gastroesophageal reflux disease)   . Headache    migraine  . History of kidney stones   . HLD (hyperlipidemia)   . HTN (hypertension)   . Morbid obesity ([place])   . Restless legs syndrome (RLS) 11/20/2012  . Tobacco abuse   . Ulcer of left ankle ([facility])    last 2 months dime size dry dressing changing q day  . Urticaria     Past Surgical History:  Procedure Laterality Date  . Arm surgery Right 1969   Abscess excision and debridement at elbow  . COLONOSCOPY WITH PROPOFOL N/A 09/06/2016   Procedure: COLONOSCOPY WITH PROPOFOL;  Surgeon: Doran Stabler, MD;  Location: [facility];  Service: Gastroenterology;  Laterality: N/A;  . I & D EXTREMITY Left 05/29/2014   Procedure: IRRIGATION AND DEBRIDEMENT OF LEFT LEG WOUND AND PLACEMENT OF  INTEGRA  AND  VAC;  Surgeon: Theodoro Kos, DO;  Location: [facility];  Service: Plastics;  Laterality: Left;  . INCISION AND DRAINAGE OF WOUND Left 04/23/2014   Procedure: IRRIGATION AND DEBRIDEMENT LOWER LEFT LEG WOUND WITH PLACEMENT OF INTEGRA AND VAC;  Surgeon: Theodoro Kos, DO;  Location: [place];  Service: Plastics;   Laterality: Left;  . kidney stone removal  2004  . MINOR APPLICATION OF WOUND VAC Left 04/23/2014   Procedure: MINOR APPLICATION OF WOUND VAC;  Surgeon: Theodoro Kos, DO;  Location: [place];  Service: Plastics;  Laterality: Left;  . SKIN SPLIT GRAFT Left 06/18/2014   Procedure: SKIN GRAFT SPLIT THICKNESS TO LOWER LEFT LEG WOUND WITH PLACEMENT OF VAC;  Surgeon: Theodoro Kos, DO;  Location: [place];  Service: Plastics;  Laterality: Left;  Marland Kitchen VASECTOMY  1994    Social History   Socioeconomic History  . Marital status: Married    Spouse name: Not on file  . Number of children: 3  . Years of education: Not on file  . Highest education level: Not on file  Occupational History  . 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 Smoking status: Current Every Day Smoker    Packs/day: 0.25    Years: 40.00    Pack years: 10.00    Types: Cigarettes  . Smokeless tobacco: Never Used  . Tobacco comment: one cigarette daily  Substance and Sexual Activity  . Alcohol use: Yes    Alcohol/week: 0.0 standard drinks    Comment: rarely  . Drug use: No  . Sexual activity: Yes    Partners: Female  Other Topics Concern  . Not on file  Social History Narrative  .  Not on file   Social Determinants of Health   Financial Resource Strain:   . Difficulty of Paying Living Expenses: Not on file  Food Insecurity:   . Worried About Charity fundraiser in the Last Year: Not on file  . Ran Out of Food in the Last Year: Not on file  Transportation Needs:   . Lack of Transportation (Medical): Not on file  . Lack of Transportation (Non-Medical): Not on file  Physical Activity:   . Days of Exercise per Week: Not on file  . Minutes of Exercise per Session: Not on file  Stress:   . Feeling of Stress : Not on file  Social Connections:   . Frequency of Communication with Friends and Family: Not on file  . Frequency of Social Gatherings with Friends and  Family: Not on file  . Attends Religious Services: Not on file  . Active Member of Clubs or Organizations: Not on file  . Attends Archivist Meetings: Not on file  . Marital Status: Not on file  Intimate Partner Violence:   . Fear of Current or Ex-Partner: Not on file  . Emotionally Abused: Not on file  . Physically Abused: Not on file  . Sexually Abused: Not on file    Current Outpatient Medications on File Prior to Visit  Medication Sig Dispense Refill  . Blood Glucose Monitoring Suppl (ONETOUCH VERIO FLEX SYSTEM) w/Device KIT 1 each by Does not apply route 2 (two) times daily. Dx: E11.622/L97.929 1 kit 0  . dapagliflozin propanediol (FARXIGA) 10 MG TABS tablet Take 10 mg by mouth daily before breakfast. 30 tablet 11  . Dulaglutide (TRULICITY) 4.5 KT/6.2BW SOPN Inject 4.5 mg into the skin once a week. 12 pen 3  . EPINEPHrine 0.3 mg/0.3 mL IJ SOAJ injection INJECT 0.3 ML INTO THE  MUSCLE ONCE FOR 1 DOSE. 2 Device 0  . gabapentin (NEURONTIN) 300 MG capsule TAKE 3 CAPSULES BY MOUTH 3  TIMES DAILY 810 capsule 1  . glipiZIDE (GLUCOTROL XL) 10 MG 24 hr tablet TAKE 2 TABLETS BY MOUTH  DAILY WITH BREAKFAST 180 tablet 1  . glucose blood (CONTOUR NEXT TEST) test strip Use to check blood sugar two times a day. 100 each 5  . Lancets Micro Thin 33G MISC Use to check blood sugar two times daily. 100 each 5  . lisinopril-hydrochlorothiazide (ZESTORETIC) 20-12.5 MG tablet TAKE 2 TABLETS BY MOUTH  DAILY 180 tablet 1  . meloxicam (MOBIC) 15 MG tablet TAKE 1 TABLET BY MOUTH  EVERY MORNING 90 tablet 1  . metFORMIN (GLUCOPHAGE-XR) 500 MG 24 hr tablet TAKE 4 TABLETS BY MOUTH  DAILY WITH BREAKFAST 120 tablet 0  . Multiple Vitamin (MULTIVITAMIN) tablet Take 1 tablet by mouth daily.      . pantoprazole (PROTONIX) 40 MG tablet TAKE 1 TABLET BY MOUTH  DAILY 90 tablet 1  . PARoxetine (PAXIL) 30 MG tablet TAKE 1 TABLET BY MOUTH  EVERY MORNING 90 tablet 1  . pentoxifylline (TRENTAL) 400 MG CR tablet TAKE 1  TABLET BY MOUTH 3  TIMES A DAY WITH MEALS 270 tablet 1  . rOPINIRole (REQUIP) 0.5 MG tablet TAKE 1 TABLET BY MOUTH TWO  TIMES DAILY 180 tablet 1  . rosuvastatin (CRESTOR) 40 MG tablet TAKE 1 TABLET BY MOUTH  DAILY 90 tablet 0  . tadalafil (CIALIS) 5 MG tablet Take one tablet by mouth 30 minutes prior to intercourse. 15 tablet 11  . varenicline (CHANTIX) 1 MG tablet Take 1 tablet (  1 mg total) by mouth 2 (two) times daily. 180 tablet 1  . vitamin C (ASCORBIC ACID) 500 MG tablet Take 500 mg by mouth daily.     No current facility-administered medications on file prior to visit.    Allergies  Allergen Reactions  . Cashew Nut Oil Anaphylaxis    Cashews, 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  Marland Kitchen Peanut Oil Anaphylaxis  . Pistachio Nut (Diagnostic) Hives, Itching, Rash, Shortness Of Breath and Swelling  . Betadine [Povidone Iodine]     Raised rash 04/23/14  . Latex Dermatitis  . Penicillins     REACTION: rash all over as child  . Lidocaine Other (See Comments)    Minimal effect with lidocaine, prefers different anesthetic    Family History  Problem Relation Age of Onset  . Stroke Mother        multiple  . Leukemia Mother   . Pneumonia Mother   . Cancer Mother   . Diabetes Mother   . Diabetes Father   . Heart disease Father   . Hyperlipidemia Father   . Hypertension Father   . Allergic rhinitis Neg Hx   . Angioedema Neg Hx   . Asthma Neg Hx   . Eczema Neg Hx   . Immunodeficiency Neg Hx   . Urticaria Neg Hx     BP (!) 142/72 (BP Location: Left Arm, Patient Position: Sitting, Cuff Size: Large)   Pulse 95   Ht 5' 10" (1.778 m)   Wt (!) 352 lb 9.6 oz (159.9 kg)   SpO2 93%   BMI 50.59 kg/m    Review of Systems He denies hypoglycemia.      Objective:   Physical Exam VITAL SIGNS:  See vs page GENERAL: no distress Pulses: dorsalis pedis intact bilat.   MSK: no deformity of the feet CV: 1+ bilat leg edema, and bilat vv's Skin: healed ulcers on the feet and legs.  normal color and temp  on the feet, but there is (chronic) erythema of the feet Neuro: sensation is intact to touch on the feet, but severely decreased from normal  Lab Results  Component Value Date   HGBA1C 8.9 (A) 07/05/2019   Lab Results  Component Value Date   CREATININE 1.31 (H) 04/12/2019   BUN 25 (H) 04/12/2019   NA 141 04/12/2019   K 4.9 04/12/2019   CL 104 04/12/2019   CO2 23 04/12/2019    Lab Results  Component Value Date   CHOL 144 06/11/2018   HDL 36 (L) 06/11/2018   LDLCALC 63 06/11/2018   LDLDIRECT 76 02/19/2014   TRIG 225 (H) 06/11/2018   CHOLHDL 4.0 06/11/2018       Assessment & Plan:  Type 2 DM, with PN: we discussed rx options.  he wants to add colesevelam, rather than starting insulin.  HTN: is noted today Renal insuff: This limits rx options  Patient Instructions  Your blood pressure is high today.  Please see your primary care provider soon, to have it rechecked I have sent a prescription to your pharmacy, to add colesevelam, and: Please continue the same other diabetes medications.  check your blood sugar once a day.  vary the time of day when you check, between before the 3 meals, and at bedtime.  also check if you have symptoms of your blood sugar being too high or too low.  please keep a record of the readings and bring it to your next appointment here (or you can bring the meter itself).  You can  write it on any piece of paper.  please call us sooner if your blood sugar goes below 70, or if you have a lot of readings over 200. We can also add "bromocriptine."  Please come back for a follow-up appointment in 2 months.

## 2019-07-05 NOTE — Patient Instructions (Addendum)
Your blood pressure is high today.  Please see your primary care provider soon, to have it rechecked I have sent a prescription to your pharmacy, to add colesevelam, and: Please continue the same other diabetes medications.  check your blood sugar once a day.  vary the time of day when you check, between before the 3 meals, and at bedtime.  also check if you have symptoms of your blood sugar being too high or too low.  please keep a record of the readings and bring it to your next appointment here (or you can bring the meter itself).  You can write it on any piece of paper.  please call us sooner if your blood sugar goes below 70, or if you have a lot of readings over 200. We can also add "bromocriptine."  Please come back for a follow-up appointment in 2 months.

## 2019-07-06 ENCOUNTER — Encounter: Payer: Self-pay | Admitting: Family Medicine

## 2019-07-06 DIAGNOSIS — E11622 Type 2 diabetes mellitus with other skin ulcer: Secondary | ICD-10-CM

## 2019-07-06 DIAGNOSIS — L97929 Non-pressure chronic ulcer of unspecified part of left lower leg with unspecified severity: Secondary | ICD-10-CM

## 2019-07-08 MED ORDER — CONTOUR NEXT TEST VI STRP: ORAL_STRIP | 5 refills | Status: DC

## 2019-07-09 ENCOUNTER — Other Ambulatory Visit: Payer: Self-pay | Admitting: Family Medicine

## 2019-07-10 ENCOUNTER — Encounter: Payer: Self-pay | Admitting: Endocrinology

## 2019-07-10 MED ORDER — EPINEPHRINE 0.3 MG/0.3ML IJ SOAJ: INTRAMUSCULAR | 0 refills | Status: DC

## 2019-07-18 ENCOUNTER — Other Ambulatory Visit: Payer: Self-pay | Admitting: Family Medicine

## 2019-07-19 NOTE — Telephone Encounter (Signed)
Last office visit 02/06/2019 for Multiple Issues.  Last refilled Gabapentin 01/17/2019 for 810 with 1 refill.  Meloxicam 01/17/2019 for #90 with 1 refill.  CPE is scheduled for 08/07/2019.  [facility] is asking for a year supply on all his medication.  Are you okay going ahead and refilling for a year or do you want me to send in 90 day supply until he has his CPE in March.  Please advise.

## 2019-08-07 ENCOUNTER — Encounter: Payer: Managed Care, Other (non HMO) | Admitting: Family Medicine

## 2019-08-22 ENCOUNTER — Encounter: Payer: Managed Care, Other (non HMO) | Admitting: Family Medicine

## 2019-08-29 ENCOUNTER — Telehealth: Payer: Self-pay | Admitting: Family Medicine

## 2019-08-29 ENCOUNTER — Other Ambulatory Visit (INDEPENDENT_AMBULATORY_CARE_PROVIDER_SITE_OTHER): Payer: Managed Care, Other (non HMO)

## 2019-08-29 ENCOUNTER — Other Ambulatory Visit: Payer: Self-pay

## 2019-08-29 DIAGNOSIS — L97929 Non-pressure chronic ulcer of unspecified part of left lower leg with unspecified severity: Secondary | ICD-10-CM

## 2019-08-29 DIAGNOSIS — I10 Essential (primary) hypertension: Secondary | ICD-10-CM

## 2019-08-29 DIAGNOSIS — Z79899 Other long term (current) drug therapy: Secondary | ICD-10-CM

## 2019-08-29 DIAGNOSIS — Z125 Encounter for screening for malignant neoplasm of prostate: Secondary | ICD-10-CM

## 2019-08-29 DIAGNOSIS — E11622 Type 2 diabetes mellitus with other skin ulcer: Secondary | ICD-10-CM

## 2019-08-29 DIAGNOSIS — E785 Hyperlipidemia, unspecified: Secondary | ICD-10-CM

## 2019-08-29 NOTE — Telephone Encounter (Signed)
-----   Message from Alvina Chou sent at 08/29/2019  8:02 AM EDT ----- Regarding: lab orders for now Patient is scheduled for CPX labs, please order future labs, Thanks , Terri  [facility]

## 2019-08-30 LAB — LIPID PANEL
Chol/HDL Ratio: 3.4 ratio (ref 0.0–5.0)
Cholesterol, Total: 102 mg/dL (ref 100–199)
HDL: 30 mg/dL — ABNORMAL LOW (ref >39)
LDL Chol Calc (NIH): 28 mg/dL (ref 0–99)
Triglycerides: 298 mg/dL — ABNORMAL HIGH (ref 0–149)
VLDL Cholesterol Cal: 44 mg/dL — ABNORMAL HIGH (ref 5–40)

## 2019-08-30 LAB — BASIC METABOLIC PANEL
BUN/Creatinine Ratio: 15 (ref 9–20)
BUN: 19 mg/dL (ref 6–24)
CO2: 26 mmol/L (ref 20–29)
Calcium: 10 mg/dL (ref 8.7–10.2)
Chloride: 102 mmol/L (ref 96–106)
Creatinine, Ser: 1.28 mg/dL — ABNORMAL HIGH (ref 0.76–1.27)
GFR calc Af Amer: 71 mL/min/{1.73_m2} (ref >59)
GFR calc non Af Amer: 61 mL/min/{1.73_m2} (ref >59)
Glucose: 178 mg/dL — ABNORMAL HIGH (ref 65–99)
Potassium: 4.6 mmol/L (ref 3.5–5.2)
Sodium: 139 mmol/L (ref 134–144)

## 2019-08-30 LAB — CBC WITH DIFFERENTIAL/PLATELET
Basophils Absolute: 0.1 10*3/uL (ref 0.0–0.2)
Basos: 1 %
EOS (ABSOLUTE): 0.2 10*3/uL (ref 0.0–0.4)
Eos: 2 %
Hematocrit: 44.7 % (ref 37.5–51.0)
Hemoglobin: 15.3 g/dL (ref 13.0–17.7)
Immature Grans (Abs): 0 10*3/uL (ref 0.0–0.1)
Immature Granulocytes: 0 %
Lymphocytes Absolute: 2 10*3/uL (ref 0.7–3.1)
Lymphs: 19 %
MCH: 29 pg (ref 26.6–33.0)
MCHC: 34.2 g/dL (ref 31.5–35.7)
MCV: 85 fL (ref 79–97)
Monocytes Absolute: 1 10*3/uL — ABNORMAL HIGH (ref 0.1–0.9)
Monocytes: 9 %
Neutrophils Absolute: 7.4 10*3/uL — ABNORMAL HIGH (ref 1.4–7.0)
Neutrophils: 69 %
Platelets: 262 10*3/uL (ref 150–450)
RBC: 5.28 x10E6/uL (ref 4.14–5.80)
RDW: 13.8 % (ref 11.6–15.4)
WBC: 10.6 10*3/uL (ref 3.4–10.8)

## 2019-08-30 LAB — HEPATIC FUNCTION PANEL
ALT: 19 IU/L (ref 0–44)
AST: 14 IU/L (ref 0–40)
Albumin: 4.6 g/dL (ref 3.8–4.9)
Alkaline Phosphatase: 52 IU/L (ref 39–117)
Bilirubin Total: 0.5 mg/dL (ref 0.0–1.2)
Bilirubin, Direct: 0.18 mg/dL (ref 0.00–0.40)
Total Protein: 6.8 g/dL (ref 6.0–8.5)

## 2019-08-30 LAB — PSA, TOTAL AND FREE
PSA, Free Pct: 20 %
PSA, Free: 0.1 ng/mL
Prostate Specific Ag, Serum: 0.5 ng/mL (ref 0.0–4.0)

## 2019-08-30 LAB — TSH: TSH: 1.54 u[IU]/mL (ref 0.450–4.500)

## 2019-09-03 LAB — HGB A1C W/O EAG: Hgb A1c MFr Bld: 8.6 % — ABNORMAL HIGH (ref 4.8–5.6)

## 2019-09-03 LAB — SPECIMEN STATUS REPORT

## 2019-09-03 NOTE — Progress Notes (Signed)
[name] [name] T. [name] Huston, MD, [place] at [facility] [place], [address]  Phone: [phone]  FAX: [phone]  [name] - 59 y.o. male  MRN [id number]  Date of Birth: 22-May-1961  Date: 09/05/2019  PCP: Owens Loffler, MD  Referral: Owens Loffler, MD  Chief Complaint  Patient presents with  . Annual Exam    This visit occurred during the SARS-CoV-2 public health emergency.  Safety protocols were in place, including screening questions prior to the visit, additional usage of staff PPE, and extensive cleaning of exam room while observing appropriate contact time as indicated for disinfecting solutions.   Patient Care Team: Owens Loffler, MD as PCP - General Druscilla Brownie, MD as Consulting Physician (Dermatology) Johnathan Hausen, MD as Consulting Physician (General Surgery) Subjective:   [name] [name] is a 59 y.o. pleasant patient who presents with the following:  Preventative Health Maintenance Visit:  Health Maintenance Summary Reviewed and updated, unless pt declines services.  Tobacco History Reviewed. Trying to quit. Alcohol: No concerns, no excessive use, none Exercise Habits: As able, limited by pain weight. STD concerns: no risk or activity to increase risk Drug Use: None  Nice gentleman, he has a number of health problems including diabetes and had some very difficult times with some diabetic ulcers on his lower legs.  He does have significant morbid obesity, and he continues to smoke.  He did lose 40 - 50 pounds recently.  Welchol had a sour stomach and constipate.  Felt sick on his stomach.  He is under quite a bit of stress at work and at home.  His wife is not been doing well, she has been depressed off and on for a long time.  He is having to take the brunt of that and some work "is that are difficult for him  Health Maintenance  Topic Date  Due  . FOOT EXAM  06/29/2019  . OPHTHALMOLOGY EXAM  07/28/2019  . INFLUENZA VACCINE  12/29/2019  . HEMOGLOBIN A1C  02/28/2020  . TETANUS/TDAP  04/20/2026  . COLONOSCOPY  09/07/2026  . PNEUMOCOCCAL POLYSACCHARIDE VACCINE AGE 39-64 HIGH RISK  Completed  . Hepatitis C Screening  Completed  . HIV Screening  Completed   Immunization History  Administered Date(s) Administered  . Influenza,inj,Quad PF,6+ Mos 04/20/2016, 03/23/2017, 03/19/2018, 02/06/2019  . Influenza-Unspecified 03/31/2015  . Pneumococcal Polysaccharide-23 11/25/2010, 04/20/2016  . Tdap 04/20/2016   Patient Active Problem List   Diagnosis Date Noted  . Diabetic leg ulcer ([place]) 07/18/2013    Priority: High  . 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 Morbid obesity with BMI of 50.0-59.9, adult ([place]) 09/29/2006    Priority: High  . Hyperlipidemia LDL goal <70 01/16/2009    Priority: Medium  . TOBACCO ABUSE 07/27/2007    Priority: Medium  . Essential hypertension 09/29/2006    Priority: Medium  . Diabetic polyneuropathy associated with type 2 diabetes mellitus ([place]) 06/30/2018  . Other chronic pain 06/30/2018  . Erectile dysfunction due to type 2 diabetes mellitus ([place]) 06/30/2018  . Circulation disorder of lower extremity 05/31/2015  . IBS (irritable bowel syndrome) 12/24/2014  . 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 Restless legs syndrome (RLS) 11/20/2012  . Generalized anxiety disorder   . CALCULUS, KIDNEY 05/30/2002    Past Medical History:  Diagnosis Date  .  Angio-edema   . Anxiety   . Depression   . Diabetes mellitus type II    type 2  . GERD (gastroesophageal reflux disease)   . Headache    migraine  . History of kidney stones   . HLD (hyperlipidemia)   . HTN (hypertension)   . Morbid obesity ([place])   . Restless legs syndrome (RLS) 11/20/2012  . Tobacco abuse   . Ulcer of left ankle (HCC)    last 2 months dime size  dry dressing changing q day  . Urticaria     Past Surgical History:  Procedure Laterality Date  . Arm surgery Right 1969   Abscess excision and debridement at elbow  . COLONOSCOPY WITH PROPOFOL N/A 09/06/2016   Procedure: COLONOSCOPY WITH PROPOFOL;  Surgeon: Doran Stabler, MD;  Location: [facility];  Service: Gastroenterology;  Laterality: N/A;  . I & D EXTREMITY Left 05/29/2014   Procedure: IRRIGATION AND DEBRIDEMENT OF LEFT LEG WOUND AND PLACEMENT OF  INTEGRA  AND  VAC;  Surgeon: Theodoro Kos, DO;  Location: [place];  Service: Plastics;  Laterality: Left;  . INCISION AND DRAINAGE OF WOUND Left 04/23/2014   Procedure: IRRIGATION AND DEBRIDEMENT LOWER LEFT LEG WOUND WITH PLACEMENT OF INTEGRA AND VAC;  Surgeon: Theodoro Kos, DO;  Location: [place];  Service: Plastics;  Laterality: Left;  . kidney stone removal  2004  . MINOR APPLICATION OF WOUND VAC Left 04/23/2014   Procedure: MINOR APPLICATION OF WOUND VAC;  Surgeon: Theodoro Kos, DO;  Location: [place];  Service: Plastics;  Laterality: Left;  . SKIN SPLIT GRAFT Left 06/18/2014   Procedure: SKIN GRAFT SPLIT THICKNESS TO LOWER LEFT LEG WOUND WITH PLACEMENT OF VAC;  Surgeon: Theodoro Kos, DO;  Location: [place];  Service: Plastics;  Laterality: Left;  Marland Kitchen VASECTOMY  1994    Family History  Problem Relation Age of Onset  . Stroke Mother        multiple  . Leukemia Mother   . Pneumonia Mother   . Cancer Mother   . Diabetes Mother   . Diabetes Father   . Heart disease Father   . Hyperlipidemia Father   . Hypertension Father   . Allergic rhinitis Neg Hx   . Angioedema Neg Hx   . Asthma Neg Hx   . Eczema Neg Hx   . Immunodeficiency Neg Hx   . Urticaria Neg Hx     Past Medical History, Surgical History, Social History, Family History, Problem List, Medications, and Allergies have been reviewed and updated if relevant.  Review of Systems: Pertinent  positives are listed above.  Otherwise, a full 14 point review of systems has been done in full and it is negative except where it is noted positive.  Objective:   BP 124/70   Pulse 99   Temp 98.3 F (36.8 C) (Temporal)   Ht 5' 9.5" (1.765 m)   Wt (!) 343 lb 12 oz (155.9 kg)   SpO2 93%   BMI 50.04 kg/m  Ideal Body Weight: Weight in (lb) to have BMI = 25: 171.4  Ideal Body Weight: Weight in (lb) to have BMI = 25: 171.4 No exam data present Depression screen [facility] 2/9 06/28/2018 06/26/2017  Decreased Interest 0 0  Down, Depressed, Hopeless 0 0  PHQ - 2 Score 0 0     GEN: well developed, well nourished, no acute distress Eyes: conjunctiva and lids normal, PERRLA, EOMI ENT: TM clear,  nares clear, oral exam WNL Neck: supple, no lymphadenopathy, no thyromegaly, no JVD Pulm: clear to auscultation and percussion, respiratory effort normal CV: regular rate and rhythm, S1-S2, no murmur, rub or gallop, no bruits, peripheral pulses normal and symmetric, no cyanosis, clubbing.  1+ edema bilateral lower extremity GI: soft, non-tender; no hepatosplenomegaly, masses; active bowel sounds all quadrants GU: no hernia, testicular mass, penile discharge Lymph: no cervical, axillary or inguinal adenopathy MSK: gait normal, muscle tone and strength WNL, no joint swelling, effusions, discoloration, crepitus  SKIN: clear, good turgor, color WNL, no rashes, lesions, or ulcerations Neuro: normal mental status, normal strength, sensation, and motion Psych: alert; oriented to person, place and time, normally interactive and not anxious or depressed in appearance.  All labs reviewed with patient. Results for orders placed or performed in visit on 08/29/19  TSH  Result Value Ref Range   TSH 1.540 0.450 - 4.500 uIU/mL  Basic metabolic panel  Result Value Ref Range   Glucose 178 (H) 65 - 99 mg/dL   BUN 19 6 - 24 mg/dL   Creatinine, Ser 1.28 (H) 0.76 - 1.27 mg/dL   GFR calc non Af Amer 61 >59 mL/min/1.73     GFR calc Af Amer 71 >59 mL/min/1.73   BUN/Creatinine Ratio 15 9 - 20   Sodium 139 134 - 144 mmol/L   Potassium 4.6 3.5 - 5.2 mmol/L   Chloride 102 96 - 106 mmol/L   CO2 26 20 - 29 mmol/L   Calcium 10.0 8.7 - 10.2 mg/dL  Hepatic function panel  Result Value Ref Range   Total Protein 6.8 6.0 - 8.5 g/dL   Albumin 4.6 3.8 - 4.9 g/dL   Bilirubin Total 0.5 0.0 - 1.2 mg/dL   Bilirubin, Direct 0.18 0.00 - 0.40 mg/dL   Alkaline Phosphatase 52 39 - 117 IU/L   AST 14 0 - 40 IU/L   ALT 19 0 - 44 IU/L  CBC with Differential/Platelet  Result Value Ref Range   WBC 10.6 3.4 - 10.8 x10E3/uL   RBC 5.28 4.14 - 5.80 x10E6/uL   Hemoglobin 15.3 13.0 - 17.7 g/dL   Hematocrit 44.7 37.5 - 51.0 %   MCV 85 79 - 97 fL   MCH 29.0 26.6 - 33.0 pg   MCHC 34.2 31.5 - 35.7 g/dL   RDW 13.8 11.6 - 15.4 %   Platelets 262 150 - 450 x10E3/uL   Neutrophils 69 Not Estab. %   Lymphs 19 Not Estab. %   Monocytes 9 Not Estab. %   Eos 2 Not Estab. %   Basos 1 Not Estab. %   Neutrophils Absolute 7.4 (H) 1.4 - 7.0 x10E3/uL   Lymphocytes Absolute 2.0 0.7 - 3.1 x10E3/uL   Monocytes Absolute 1.0 (H) 0.1 - 0.9 x10E3/uL   EOS (ABSOLUTE) 0.2 0.0 - 0.4 x10E3/uL   Basophils Absolute 0.1 0.0 - 0.2 x10E3/uL   Immature Granulocytes 0 Not Estab. %   Immature Grans (Abs) 0.0 0.0 - 0.1 x10E3/uL  Lipid panel  Result Value Ref Range   Cholesterol, Total 102 100 - 199 mg/dL   Triglycerides 298 (H) 0 - 149 mg/dL   HDL 30 (L) >39 mg/dL   VLDL Cholesterol Cal 44 (H) 5 - 40 mg/dL   LDL Chol Calc (NIH) 28 0 - 99 mg/dL   Chol/HDL Ratio 3.4 0.0 - 5.0 ratio  PSA, total and free  Result Value Ref Range   Prostate Specific Ag, Serum 0.5 0.0 - 4.0 ng/mL   PSA,  Free 0.10 N/A ng/mL   PSA, Free Pct 20.0 %  Hgb A1c w/o eAG  Result Value Ref Range   Hgb A1c MFr Bld 8.6 (H) 4.8 - 5.6 %  Specimen status report  Result Value Ref Range   specimen status report Comment     Assessment and Plan:     ICD-10-CM   1. Healthcare maintenance   Z00.00    He is doing reasonably well, and he has lost 50 pounds.  Endocrinology has thankfully been helping get his poorly controlled diabetes more under control.  He has been occasionally getting lightheaded, so I am going to decrease his lisinopril hydrochlorothiazide down to 1 pill only down from 2.  I encouraged him about all of his weight loss.  Health Maintenance Exam: The patient's preventative maintenance and recommended screening tests for an annual wellness exam were reviewed in full today. Brought up to date unless services declined.  Counselled on the importance of diet, exercise, and its role in overall health and mortality. The patient's FH and SH was reviewed, including their home life, tobacco status, and drug and alcohol status.  Follow-up in 1 year for physical exam or additional follow-up below.  Follow-up: No follow-ups on file. Or follow-up in 1 year if not noted.  Meds ordered this encounter  Medications  . DISCONTD: lisinopril (ZESTRIL) 20 MG tablet    Sig: Take 1 tablet (20 mg total) by mouth daily.    Dispense:  90 tablet    Refill:  3  . sildenafil (VIAGRA) 100 MG tablet    Sig: Take 1 tablet (100 mg total) by mouth daily as needed for erectile dysfunction.    Dispense:  10 tablet    Refill:  5  . lisinopril-hydrochlorothiazide (ZESTORETIC) 20-12.5 MG tablet    Sig: Take 1 tablet by mouth daily.    Dispense:  90 tablet    Refill:  3    Keep this, error in sending lisinopril alone   Medications Discontinued During This Encounter  Medication Reason  . lisinopril-hydrochlorothiazide (ZESTORETIC) 20-12.5 MG tablet   . lisinopril (ZESTRIL) 20 MG tablet    No orders of the defined types were placed in this encounter.   Signed,  Maud Deed. [name] Damon, MD   Allergies as of 09/05/2019      Reactions   Cashew Nut Oil Anaphylaxis   Cashews, 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   Pistachio Nut (diagnostic) Hives, Itching, Rash, Shortness Of Breath, Swelling   Betadine  [povidone Iodine]    Raised rash 04/23/14   Latex Dermatitis   Penicillins    REACTION: rash all over as child   Lidocaine Other (See Comments)   Minimal effect with lidocaine, prefers different anesthetic      Medication List       Accurate as of September 05, 2019 11:59 PM. If you have any questions, ask your nurse or doctor.        colesevelam 625 MG tablet Commonly known as: WELCHOL Take 3 tablets (1,875 mg total) by mouth daily.   Contour Next Test test strip Generic drug: glucose blood Use to check blood sugar two times a day.   EPINEPHrine 0.3 mg/0.3 mL Soaj injection Commonly known as: EPI-PEN INJECT 0.3 ML INTO THE  MUSCLE ONCE FOR 1 DOSE.   Farxiga 10 MG Tabs tablet Generic drug: dapagliflozin propanediol Take 10 mg by mouth daily before breakfast.   gabapentin 300 MG capsule Commonly known as: NEURONTIN TAKE 3 CAPSULES BY MOUTH 3  TIMES DAILY  glipiZIDE 10 MG 24 hr tablet Commonly known as: GLUCOTROL XL TAKE 2 TABLETS BY MOUTH  DAILY WITH BREAKFAST   Lancets Micro Thin 33G Misc Use to check blood sugar two times daily.   lisinopril-hydrochlorothiazide 20-12.5 MG tablet Commonly known as: ZESTORETIC Take 1 tablet by mouth daily. What changed: how much to take Changed by: Owens Loffler, MD   meloxicam 15 MG tablet Commonly known as: MOBIC TAKE 1 TABLET BY MOUTH IN  THE MORNING   metFORMIN 500 MG 24 hr tablet Commonly known as: GLUCOPHAGE-XR TAKE 4 TABLETS BY MOUTH  DAILY WITH BREAKFAST   multivitamin tablet Take 1 tablet by mouth daily.   OneTouch Verio Flex System w/Device Kit 1 each by Does not apply route 2 (two) times daily. Dx: E11.622/L97.929   pantoprazole 40 MG tablet Commonly known as: PROTONIX TAKE 1 TABLET BY MOUTH  DAILY   PARoxetine 30 MG tablet Commonly known as: PAXIL TAKE 1 TABLET BY MOUTH IN  THE MORNING   pentoxifylline 400 MG CR tablet Commonly known as: TRENTAL TAKE 1 TABLET BY MOUTH 3  TIMES DAILY WITH MEALS     rOPINIRole 0.5 MG tablet Commonly known as: REQUIP TAKE 1 TABLET BY MOUTH  TWICE DAILY   rosuvastatin 40 MG tablet Commonly known as: CRESTOR TAKE 1 TABLET BY MOUTH  DAILY   sildenafil 100 MG tablet Commonly known as: VIAGRA Take 1 tablet (100 mg total) by mouth daily as needed for erectile dysfunction. Started by: Owens Loffler, MD   tadalafil 5 MG tablet Commonly known as: Cialis Take one tablet by mouth 30 minutes prior to intercourse.   Trulicity 4.5 TG/2.5WL Sopn Generic drug: Dulaglutide Inject 4.5 mg into the skin once a week.   varenicline 1 MG tablet Commonly known as: Chantix Take 1 tablet (1 mg total) by mouth 2 (two) times daily.   vitamin C 500 MG tablet Commonly known as: ASCORBIC ACID Take 500 mg by mouth daily.

## 2019-09-05 ENCOUNTER — Encounter: Payer: Self-pay | Admitting: Family Medicine

## 2019-09-05 ENCOUNTER — Other Ambulatory Visit: Payer: Self-pay

## 2019-09-05 ENCOUNTER — Ambulatory Visit (INDEPENDENT_AMBULATORY_CARE_PROVIDER_SITE_OTHER): Payer: Managed Care, Other (non HMO) | Admitting: Family Medicine

## 2019-09-05 VITALS — BP 124/70 | HR 99 | Temp 98.3°F | Ht 69.5 in | Wt 343.8 lb

## 2019-09-05 DIAGNOSIS — Z0000 Encounter for general adult medical examination without abnormal findings: Secondary | ICD-10-CM

## 2019-09-05 MED ORDER — SILDENAFIL CITRATE 100 MG PO TABS: 100.0000 mg | ORAL_TABLET | Freq: Every day | ORAL | 5 refills | Status: DC | PRN

## 2019-09-05 MED ORDER — LISINOPRIL 20 MG PO TABS: 20.0000 mg | ORAL_TABLET | Freq: Every day | ORAL | 3 refills | Status: DC

## 2019-09-05 MED ORDER — LISINOPRIL-HYDROCHLOROTHIAZIDE 20-12.5 MG PO TABS: 1.0000 | ORAL_TABLET | Freq: Every day | ORAL | 3 refills | Status: DC

## 2019-09-05 NOTE — Patient Instructions (Addendum)
Cut back the lisinopril / HCTZ down to ONE pill a day   If [facility] will not cover it: look on the internet for cheap prices:  [facility], in [place] [employer]  Viagra (sildenafil)  Cialis   ?? Levitra ??

## 2019-09-06 ENCOUNTER — Other Ambulatory Visit: Payer: Self-pay | Admitting: *Deleted

## 2019-09-06 MED ORDER — LISINOPRIL-HYDROCHLOROTHIAZIDE 20-12.5 MG PO TABS: 1.0000 | ORAL_TABLET | Freq: Every day | ORAL | 3 refills | Status: DC

## 2019-09-06 NOTE — Telephone Encounter (Signed)
Received a call from [facility] for Lisinopril-HCTZ 20-12.5mg   They received two Rx on 09/05/19 One for single Lisinopril and then combo Needed clarification of which the patient is taking.  Gave clarification for combo med and they stated they needed this Rx to be sent again.   Rx re-sent.   Nothing further needed.

## 2019-09-11 ENCOUNTER — Other Ambulatory Visit: Payer: Self-pay

## 2019-09-13 ENCOUNTER — Encounter: Payer: Self-pay | Admitting: Endocrinology

## 2019-09-13 ENCOUNTER — Other Ambulatory Visit: Payer: Self-pay

## 2019-09-13 ENCOUNTER — Ambulatory Visit (INDEPENDENT_AMBULATORY_CARE_PROVIDER_SITE_OTHER): Payer: Managed Care, Other (non HMO) | Admitting: Endocrinology

## 2019-09-13 VITALS — BP 138/80 | HR 92 | Ht 69.5 in | Wt 345.0 lb

## 2019-09-13 DIAGNOSIS — E11622 Type 2 diabetes mellitus with other skin ulcer: Secondary | ICD-10-CM

## 2019-09-13 DIAGNOSIS — L97929 Non-pressure chronic ulcer of unspecified part of left lower leg with unspecified severity: Secondary | ICD-10-CM | POA: Diagnosis not present

## 2019-09-13 MED ORDER — BROMOCRIPTINE MESYLATE 2.5 MG PO TABS: 1.2500 mg | ORAL_TABLET | Freq: Every day | ORAL | 3 refills | Status: DC

## 2019-09-13 MED ORDER — COLESEVELAM HCL 625 MG PO TABS: 1250.0000 mg | ORAL_TABLET | Freq: Every day | ORAL | 11 refills | Status: DC

## 2019-09-13 NOTE — Patient Instructions (Addendum)
I have sent a prescription to your pharmacy, to add bromocriptine, and: Please continue the same other diabetes medications.  check your blood sugar once a day.  vary the time of day when you check, between before the 3 meals, and at bedtime.  also check if you have symptoms of your blood sugar being too high or too low.  please keep a record of the readings and bring it to your next appointment here (or you can bring the meter itself).  You can write it on any piece of paper.  please call us sooner if your blood sugar goes below 70, or if you have a lot of readings over 200.  Please come back for a follow-up appointment in 2 months.

## 2019-09-13 NOTE — Progress Notes (Signed)
Subjective:    Patient ID: [name] [name], male    DOB: 24-May-1961, 59 y.o.   MRN: [id number]  HPI Pt returns for f/u of diabetes mellitus: DM type: 2 Dx'ed: 6226 Complications: PN, renal insuff, and foot ulcers Therapy: trulicity and 4 oral meds. DKA: never Severe hypoglycemia: once, in 2016 Pancreatitis: never Pancreatic imaging: never SDOH: wife provides most of hx, due to pt's difficulty with concentration Other: he has never been on insulin; wife is nurse, and pt has also administered insulin to his father; edema limits rx options.   Interval history: no cbg record, but states cbg varies from 150-180.  Pt is here alone today.  Pt says due to a med error at home, he took pioglitazone, rather than Farxiga (x 14 days, last month).  He takes Welchol, just 1 tab per day.   Past Medical History:  Diagnosis Date  . Angio-edema   . Anxiety   . Depression   . Diabetes mellitus type II    type 2  . GERD (gastroesophageal reflux disease)   . Headache    migraine  . History of kidney stones   . HLD (hyperlipidemia)   . HTN (hypertension)   . Morbid obesity ([place])   . Restless legs syndrome (RLS) 11/20/2012  . Tobacco abuse   . Ulcer of left ankle ([facility])    last 2 months dime size dry dressing changing q day  . Urticaria     Past Surgical History:  Procedure Laterality Date  . Arm surgery Right 1969   Abscess excision and debridement at elbow  . COLONOSCOPY WITH PROPOFOL N/A 09/06/2016   Procedure: COLONOSCOPY WITH PROPOFOL;  Surgeon: Doran Stabler, MD;  Location: [facility];  Service: Gastroenterology;  Laterality: N/A;  . I & D EXTREMITY Left 05/29/2014   Procedure: IRRIGATION AND DEBRIDEMENT OF LEFT LEG WOUND AND PLACEMENT OF  INTEGRA  AND  VAC;  Surgeon: Theodoro Kos, DO;  Location: [place];  Service: Plastics;  Laterality: Left;  . INCISION AND DRAINAGE OF WOUND Left 04/23/2014   Procedure: IRRIGATION AND DEBRIDEMENT LOWER LEFT LEG WOUND WITH  PLACEMENT OF INTEGRA AND VAC;  Surgeon: Theodoro Kos, DO;  Location: [place];  Service: Plastics;  Laterality: Left;  . kidney stone removal  2004  . MINOR APPLICATION OF WOUND VAC Left 04/23/2014   Procedure: MINOR APPLICATION OF WOUND VAC;  Surgeon: Theodoro Kos, DO;  Location: [place];  Service: Plastics;  Laterality: Left;  . SKIN SPLIT GRAFT Left 06/18/2014   Procedure: SKIN GRAFT SPLIT THICKNESS TO LOWER LEFT LEG WOUND WITH PLACEMENT OF VAC;  Surgeon: Theodoro Kos, DO;  Location: [place];  Service: Plastics;  Laterality: Left;  Marland Kitchen VASECTOMY  1994    Social History   Socioeconomic History  . Marital status: Married    Spouse name: Not on file  . Number of children: 3  . Years of education: Not on file  . Highest education level: Not on file  Occupational History  . 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 Smoking status: Current Every Day Smoker    Packs/day: 0.25    Years: 40.00    Pack years: 10.00    Types: Cigarettes  . Smokeless tobacco: Never Used  . Tobacco comment: one cigarette daily  Substance and Sexual Activity  . Alcohol use: Yes    Alcohol/week: 0.0 standard drinks    Comment: rarely  . Drug use:  No  . Sexual activity: Yes    Partners: Female  Other Topics Concern  . Not on file  Social History Narrative  . Not on file   Social Determinants of Health   Financial Resource Strain:   . Difficulty of Paying Living Expenses:   Food Insecurity:   . Worried About Charity fundraiser in the Last Year:   . Arboriculturist in the Last Year:   Transportation Needs:   . Film/video editor (Medical):   Marland Kitchen Lack of Transportation (Non-Medical):   Physical Activity:   . Days of Exercise per Week:   . Minutes of Exercise per Session:   Stress:   . Feeling of Stress :   Social Connections:   . Frequency of Communication with Friends and Family:   . Frequency of Social Gatherings  with Friends and Family:   . Attends Religious Services:   . Active Member of Clubs or Organizations:   . Attends Archivist Meetings:   Marland Kitchen Marital Status:   Intimate Partner Violence:   . Fear of Current or Ex-Partner:   . Emotionally Abused:   Marland Kitchen Physically Abused:   . Sexually Abused:     Current Outpatient Medications on File Prior to Visit  Medication Sig Dispense Refill  . Blood Glucose Monitoring Suppl (ONETOUCH VERIO FLEX SYSTEM) w/Device KIT 1 each by Does not apply route 2 (two) times daily. Dx: E11.622/L97.929 1 kit 0  . dapagliflozin propanediol (FARXIGA) 10 MG TABS tablet Take 10 mg by mouth daily before breakfast. 30 tablet 11  . Dulaglutide (TRULICITY) 4.5 ZO/1.0RU SOPN Inject 4.5 mg into the skin once a week. 12 pen 3  . EPINEPHrine 0.3 mg/0.3 mL IJ SOAJ injection INJECT 0.3 ML INTO THE  MUSCLE ONCE FOR 1 DOSE. 2 each 0  . gabapentin (NEURONTIN) 300 MG capsule TAKE 3 CAPSULES BY MOUTH 3  TIMES DAILY 810 capsule 3  . glipiZIDE (GLUCOTROL XL) 10 MG 24 hr tablet TAKE 2 TABLETS BY MOUTH  DAILY WITH BREAKFAST 180 tablet 1  . glucose blood (CONTOUR NEXT TEST) test strip Use to check blood sugar two times a day. 100 each 5  . Lancets Micro Thin 33G MISC Use to check blood sugar two times daily. 100 each 5  . lisinopril-hydrochlorothiazide (ZESTORETIC) 20-12.5 MG tablet Take 1 tablet by mouth daily. 90 tablet 3  . meloxicam (MOBIC) 15 MG tablet TAKE 1 TABLET BY MOUTH IN  THE MORNING 90 tablet 3  . metFORMIN (GLUCOPHAGE-XR) 500 MG 24 hr tablet TAKE 4 TABLETS BY MOUTH  DAILY WITH BREAKFAST 360 tablet 3  . Multiple Vitamin (MULTIVITAMIN) tablet Take 1 tablet by mouth daily.      . pantoprazole (PROTONIX) 40 MG tablet TAKE 1 TABLET BY MOUTH  DAILY 90 tablet 3  . PARoxetine (PAXIL) 30 MG tablet TAKE 1 TABLET BY MOUTH IN  THE MORNING 90 tablet 3  . pentoxifylline (TRENTAL) 400 MG CR tablet TAKE 1 TABLET BY MOUTH 3  TIMES DAILY WITH MEALS 270 tablet 3  . rOPINIRole (REQUIP) 0.5  MG tablet TAKE 1 TABLET BY MOUTH  TWICE DAILY 180 tablet 3  . rosuvastatin (CRESTOR) 40 MG tablet TAKE 1 TABLET BY MOUTH  DAILY 90 tablet 3  . sildenafil (VIAGRA) 100 MG tablet Take 1 tablet (100 mg total) by mouth daily as needed for erectile dysfunction. 10 tablet 5  . tadalafil (CIALIS) 5 MG tablet Take one tablet by mouth 30 minutes prior to intercourse.  15 tablet 11  . varenicline (CHANTIX) 1 MG tablet Take 1 tablet (1 mg total) by mouth 2 (two) times daily. 180 tablet 1  . vitamin C (ASCORBIC ACID) 500 MG tablet Take 500 mg by mouth daily.     No current facility-administered medications on file prior to visit.    Allergies  Allergen Reactions  . Cashew Nut Oil Anaphylaxis    Cashews, 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  Marland Kitchen Pistachio Nut (Diagnostic) Hives, Itching, Rash, Shortness Of Breath and Swelling  . Betadine [Povidone Iodine]     Raised rash 04/23/14  . Latex Dermatitis  . Penicillins     REACTION: rash all over as child  . Lidocaine Other (See Comments)    Minimal effect with lidocaine, prefers different anesthetic    Family History  Problem Relation Age of Onset  . Stroke Mother        multiple  . Leukemia Mother   . Pneumonia Mother   . Cancer Mother   . Diabetes Mother   . Diabetes Father   . Heart disease Father   . Hyperlipidemia Father   . Hypertension Father   . Allergic rhinitis Neg Hx   . Angioedema Neg Hx   . Asthma Neg Hx   . Eczema Neg Hx   . Immunodeficiency Neg Hx   . Urticaria Neg Hx     BP 138/80   Pulse 92   Ht 5' 9.5" (1.765 m)   Wt (!) 345 lb (156.5 kg)   SpO2 99%   BMI 50.22 kg/m    Review of Systems He denies hypoglycemia.      Objective:   Physical Exam VITAL SIGNS:  See vs page GENERAL: no distress Pulses: dorsalis pedis intact bilat.   MSK: no deformity of the feet CV: 1+ bilat leg edema, and bilat vv's.  Skin: healed ulcers on the feet and legs.  normal color and temp on the feet, but there is (chronic) erythema of the feet.   Neuro: sensation is intact to touch on the feet, but severely decreased from normal.    Lab Results  Component Value Date   HGBA1C 8.6 (H) 08/29/2019   Lab Results  Component Value Date   CREATININE 1.28 (H) 08/29/2019   BUN 19 08/29/2019   NA 139 08/29/2019   K 4.6 08/29/2019   CL 102 08/29/2019   CO2 26 08/29/2019      Assessment & Plan:  Type 2 DM: he needs increased rx Edema: This limits rx options Renal insuff: This also limits rx options   Patient Instructions  I have sent a prescription to your pharmacy, to add bromocriptine, and: Please continue the same other diabetes medications.  check your blood sugar once a day.  vary the time of day when you check, between before the 3 meals, and at bedtime.  also check if you have symptoms of your blood sugar being too high or too low.  please keep a record of the readings and bring it to your next appointment here (or you can bring the meter itself).  You can write it on any piece of paper.  please call us sooner if your blood sugar goes below 70, or if you have a lot of readings over 200.  Please come back for a follow-up appointment in 2 months.

## 2019-09-14 ENCOUNTER — Encounter: Payer: Self-pay | Admitting: Endocrinology

## 2019-09-16 ENCOUNTER — Other Ambulatory Visit: Payer: Self-pay | Admitting: Family Medicine

## 2019-10-02 ENCOUNTER — Other Ambulatory Visit: Payer: Self-pay | Admitting: Family Medicine

## 2019-10-07 ENCOUNTER — Other Ambulatory Visit: Payer: Self-pay | Admitting: Family Medicine

## 2019-10-08 ENCOUNTER — Encounter: Payer: Self-pay | Admitting: *Deleted

## 2019-10-08 MED ORDER — GLIPIZIDE ER 10 MG PO TB24: ORAL_TABLET | ORAL | 1 refills | Status: DC

## 2019-10-11 ENCOUNTER — Encounter: Payer: Self-pay | Admitting: Family Medicine

## 2019-10-13 ENCOUNTER — Other Ambulatory Visit: Payer: Self-pay | Admitting: Family Medicine

## 2019-10-14 MED ORDER — EPINEPHRINE 0.3 MG/0.3ML IJ SOAJ: INTRAMUSCULAR | 0 refills | Status: DC

## 2019-10-23 ENCOUNTER — Other Ambulatory Visit: Payer: Self-pay | Admitting: Family Medicine

## 2019-12-06 ENCOUNTER — Ambulatory Visit: Payer: Managed Care, Other (non HMO) | Admitting: Endocrinology

## 2019-12-31 ENCOUNTER — Encounter: Payer: Self-pay | Admitting: Family Medicine

## 2020-01-03 ENCOUNTER — Ambulatory Visit: Payer: Managed Care, Other (non HMO) | Admitting: Endocrinology

## 2020-01-13 ENCOUNTER — Encounter: Payer: Self-pay | Admitting: Family Medicine

## 2020-01-14 MED ORDER — GLIPIZIDE ER 10 MG PO TB24: ORAL_TABLET | ORAL | 1 refills | Status: DC

## 2020-01-17 ENCOUNTER — Encounter: Payer: Self-pay | Admitting: Endocrinology

## 2020-01-17 ENCOUNTER — Ambulatory Visit (INDEPENDENT_AMBULATORY_CARE_PROVIDER_SITE_OTHER): Payer: Managed Care, Other (non HMO) | Admitting: Endocrinology

## 2020-01-17 ENCOUNTER — Other Ambulatory Visit: Payer: Self-pay

## 2020-01-17 VITALS — BP 134/84 | HR 86 | Ht 69.5 in | Wt 339.8 lb

## 2020-01-17 DIAGNOSIS — L97929 Non-pressure chronic ulcer of unspecified part of left lower leg with unspecified severity: Secondary | ICD-10-CM | POA: Diagnosis not present

## 2020-01-17 DIAGNOSIS — E11622 Type 2 diabetes mellitus with other skin ulcer: Secondary | ICD-10-CM | POA: Diagnosis not present

## 2020-01-17 LAB — POCT GLYCOSYLATED HEMOGLOBIN (HGB A1C): Hemoglobin A1C: 8.1 % — AB (ref 4.0–5.6)

## 2020-01-17 NOTE — Progress Notes (Signed)
Subjective:    Patient ID: [name] [name], male    DOB: May 16, 1961, 59 y.o.   MRN: [id number]  HPI Pt returns for f/u of diabetes mellitus: DM type: 2 Dx'ed: 2010 Complications: PN, renal insuff, and foot ulcers Therapy: trulicity and 4 oral meds. DKA: never Severe hypoglycemia: once, in 2016 Pancreatitis: never Pancreatic imaging: never SDOH: wife provides most of hx, due to pt's difficulty with concentration.   Other: he has never been on insulin; wife is nurse, and pt has also administered insulin to his father; edema limits rx options.   Interval history: no cbg record, but states cbg varies from 130-200.  Pt is here alone again today.  Main symptom is nausea.    Past Medical History:  Diagnosis Date  . Angio-edema   . Anxiety   . Depression   . Diabetes mellitus type II    type 2  . GERD (gastroesophageal reflux disease)   . Headache    migraine  . History of kidney stones   . HLD (hyperlipidemia)   . HTN (hypertension)   . Morbid obesity ([facility])   . Restless legs syndrome (RLS) 11/20/2012  . Tobacco abuse   . Ulcer of left ankle ([facility])    last 2 months dime size dry dressing changing q day  . Urticaria     Past Surgical History:  Procedure Laterality Date  . Arm surgery Right 1969   Abscess excision and debridement at elbow  . COLONOSCOPY WITH PROPOFOL N/A 09/06/2016   Procedure: COLONOSCOPY WITH PROPOFOL;  Surgeon: Sherrilyn Rist, MD;  Location: [facility];  Service: Gastroenterology;  Laterality: N/A;  . I & D EXTREMITY Left 05/29/2014   Procedure: IRRIGATION AND DEBRIDEMENT OF LEFT LEG WOUND AND PLACEMENT OF  INTEGRA  AND  VAC;  Surgeon: Wayland Denis, DO;  Location: [facility];  Service: Plastics;  Laterality: Left;  . INCISION AND DRAINAGE OF WOUND Left 04/23/2014   Procedure: IRRIGATION AND DEBRIDEMENT LOWER LEFT LEG WOUND WITH PLACEMENT OF INTEGRA AND VAC;  Surgeon: Wayland Denis, DO;  Location: [facility];  Service:  Plastics;  Laterality: Left;  . kidney stone removal  2004  . MINOR APPLICATION OF WOUND VAC Left 04/23/2014   Procedure: MINOR APPLICATION OF WOUND VAC;  Surgeon: Wayland Denis, DO;  Location: [facility];  Service: Plastics;  Laterality: Left;  . SKIN SPLIT GRAFT Left 06/18/2014   Procedure: SKIN GRAFT SPLIT THICKNESS TO LOWER LEFT LEG WOUND WITH PLACEMENT OF VAC;  Surgeon: Wayland Denis, DO;  Location: [facility];  Service: Plastics;  Laterality: Left;  Marland Kitchen VASECTOMY  1994    Social History   Socioeconomic History  . Marital status: Married    Spouse name: Not on file  . Number of children: 3  . Years of education: Not on file  . Highest education level: Not on file  Occupational History  . 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 Smoking status: Current Every Day Smoker    Packs/day: 0.25    Years: 40.00    Pack years: 10.00    Types: Cigarettes  . Smokeless tobacco: Never Used  . Tobacco comment: one cigarette daily  Substance and Sexual Activity  . Alcohol use: Yes    Alcohol/week: 0.0 standard drinks    Comment: rarely  . Drug use: No  . Sexual activity: Yes    Partners: Female  Other Topics Concern  . Not on file  Social History Narrative  . Not on file   Social Determinants of Health   Financial Resource Strain:   . Difficulty of Paying Living Expenses: Not on file  Food Insecurity:   . Worried About Programme researcher, broadcasting/film/video in the Last Year: Not on file  . Ran Out of Food in the Last Year: Not on file  Transportation Needs:   . Lack of Transportation (Medical): Not on file  . Lack of Transportation (Non-Medical): Not on file  Physical Activity:   . Days of Exercise per Week: Not on file  . Minutes of Exercise per Session: Not on file  Stress:   . Feeling of Stress : Not on file  Social Connections:   . Frequency of Communication with Friends and Family: Not on file  . Frequency of Social Gatherings with  Friends and Family: Not on file  . Attends Religious Services: Not on file  . Active Member of Clubs or Organizations: Not on file  . Attends Banker Meetings: Not on file  . Marital Status: Not on file  Intimate Partner Violence:   . Fear of Current or Ex-Partner: Not on file  . Emotionally Abused: Not on file  . Physically Abused: Not on file  . Sexually Abused: Not on file    Current Outpatient Medications on File Prior to Visit  Medication Sig Dispense Refill  . bromocriptine (PARLODEL) 2.5 MG tablet Take 0.5 tablets (1.25 mg total) by mouth at bedtime. 45 tablet 3  . colesevelam (WELCHOL) 625 MG tablet Take 2 tablets (1,250 mg total) by mouth daily. 60 tablet 11  . dapagliflozin propanediol (FARXIGA) 10 MG TABS tablet Take 10 mg by mouth daily before breakfast. 30 tablet 11  . Dulaglutide (TRULICITY) 4.5 MG/0.5ML SOPN Inject 4.5 mg into the skin once a week. 12 pen 3  . EPINEPHrine 0.3 mg/0.3 mL IJ SOAJ injection INJECT 0.3 ML INTO THE  MUSCLE ONCE FOR 1 DOSE. 2 each 0  . gabapentin (NEURONTIN) 300 MG capsule TAKE 3 CAPSULES BY MOUTH 3  TIMES DAILY 810 capsule 3  . glipiZIDE (GLUCOTROL XL) 10 MG 24 hr tablet TAKE 2 TABLETS BY MOUTH  DAILY WITH BREAKFAST 180 tablet 1  . glucose blood (CONTOUR NEXT TEST) test strip Use to check blood sugar two times a day. (Patient taking differently: 1 each by Other route 2 (two) times daily. E11.9) 100 each 5  . Lancets Micro Thin 33G MISC Use to check blood sugar two times daily. (Patient taking differently: 1 each by Other route 2 (two) times daily. E11.9) 100 each 5  . lisinopril-hydrochlorothiazide (ZESTORETIC) 20-12.5 MG tablet Take 1 tablet by mouth daily. 90 tablet 3  . meloxicam (MOBIC) 15 MG tablet TAKE 1 TABLET BY MOUTH IN  THE MORNING 90 tablet 3  . metFORMIN (GLUCOPHAGE-XR) 500 MG 24 hr tablet TAKE 4 TABLETS BY MOUTH  DAILY WITH BREAKFAST 360 tablet 3  . Multiple Vitamin (MULTIVITAMIN) tablet Take 1 tablet by mouth daily.       . pantoprazole (PROTONIX) 40 MG tablet TAKE 1 TABLET BY MOUTH  DAILY 90 tablet 3  . PARoxetine (PAXIL) 30 MG tablet TAKE 1 TABLET BY MOUTH IN  THE MORNING 90 tablet 3  . pentoxifylline (TRENTAL) 400 MG CR tablet TAKE 1 TABLET BY MOUTH 3  TIMES DAILY WITH MEALS 270 tablet 3  . rOPINIRole (REQUIP) 0.5 MG tablet TAKE 1 TABLET BY MOUTH  TWICE DAILY 180 tablet 3  . rosuvastatin (CRESTOR) 40 MG  tablet TAKE 1 TABLET BY MOUTH  DAILY 90 tablet 3  . sildenafil (VIAGRA) 100 MG tablet Take 1 tablet (100 mg total) by mouth daily as needed for erectile dysfunction. 10 tablet 5  . varenicline (CHANTIX) 1 MG tablet Take 1 tablet (1 mg total) by mouth 2 (two) times daily. 180 tablet 1  . vitamin C (ASCORBIC ACID) 500 MG tablet Take 500 mg by mouth daily.     No current facility-administered medications on file prior to visit.    Allergies  Allergen Reactions  . Cashew Nut Oil Anaphylaxis    Cashews, 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  Marland Kitchen Pistachio Nut (Diagnostic) Hives, Itching, Rash, Shortness Of Breath and Swelling  . Betadine [Povidone Iodine]     Raised rash 04/23/14  . Latex Dermatitis  . Penicillins     REACTION: rash all over as child  . Lidocaine Other (See Comments)    Minimal effect with lidocaine, prefers different anesthetic    Family History  Problem Relation Age of Onset  . Stroke Mother        multiple  . Leukemia Mother   . Pneumonia Mother   . Cancer Mother   . Diabetes Mother   . Diabetes Father   . Heart disease Father   . Hyperlipidemia Father   . Hypertension Father   . Allergic rhinitis Neg Hx   . Angioedema Neg Hx   . Asthma Neg Hx   . Eczema Neg Hx   . Immunodeficiency Neg Hx   . Urticaria Neg Hx     BP 134/84   Pulse 86   Ht 5' 9.5" (1.765 m)   Wt (!) 339 lb 12.8 oz (154.1 kg)   SpO2 94%   BMI 49.46 kg/m    Review of Systems He denies hypoglycemia.     Objective:   Physical Exam VITAL SIGNS:  See vs page GENERAL: no distress Pulses: dorsalis pedis intact  bilat.   MSK: no deformity of the feet CV: 2+ bilat leg edema, and bilat vv's.  Skin: healed ulcers on the feet and legs.  normal color and temp on the feet, but there is (chronic) erythema of the feet.  Neuro: sensation is intact to touch on the feet, but severely decreased from normal.     Lab Results  Component Value Date   HGBA1C 8.1 (A) 01/17/2020       Assessment & Plan:  Type 2 DM: uncontrolled.  I advised insulin.  He declines Nausea, due to Trulicity, or parlodel.  We discussed.  He declines to reduce.    Patient Instructions  Please continue the same medications.  check your blood sugar once a day.  vary the time of day when you check, between before the 3 meals, and at bedtime.  also check if you have symptoms of your blood sugar being too high or too low.  please keep a record of the readings and bring it to your next appointment here (or you can bring the meter itself).  You can write it on any piece of paper.  please call us sooner if your blood sugar goes below 70, or if you have a lot of readings over 200.  Please come back for a follow-up appointment in 3 months.

## 2020-01-17 NOTE — Patient Instructions (Addendum)
Please continue the same medications.    check your blood sugar once a day.  vary the time of day when you check, between before the 3 meals, and at bedtime.  also check if you have symptoms of your blood sugar being too high or too low.  please keep a record of the readings and bring it to your next appointment here (or you can bring the meter itself).  You can write it on any piece of paper.  please call us sooner if your blood sugar goes below 70, or if you have a lot of readings over 200.  Please come back for a follow-up appointment in 3 months.    

## 2020-02-14 ENCOUNTER — Inpatient Hospital Stay (HOSPITAL_COMMUNITY): Payer: Managed Care, Other (non HMO)

## 2020-02-14 ENCOUNTER — Emergency Department (HOSPITAL_COMMUNITY): Payer: Managed Care, Other (non HMO)

## 2020-02-14 ENCOUNTER — Observation Stay (HOSPITAL_COMMUNITY)
Admission: EM | Admit: 2020-02-14 | Discharge: 2020-02-15 | Disposition: A | Discharge status: Home / Self Care | Payer: Managed Care, Other (non HMO) | Attending: Internal Medicine | Admitting: Internal Medicine

## 2020-02-14 ENCOUNTER — Other Ambulatory Visit: Payer: Self-pay

## 2020-02-14 ENCOUNTER — Inpatient Hospital Stay (HOSPITAL_BASED_OUTPATIENT_CLINIC_OR_DEPARTMENT_OTHER): Payer: Managed Care, Other (non HMO)

## 2020-02-14 DIAGNOSIS — Z7984 Long term (current) use of oral hypoglycemic drugs: Secondary | ICD-10-CM | POA: Diagnosis not present

## 2020-02-14 DIAGNOSIS — I639 Cerebral infarction, unspecified: Principal | ICD-10-CM | POA: Diagnosis present

## 2020-02-14 DIAGNOSIS — Z79899 Other long term (current) drug therapy: Secondary | ICD-10-CM | POA: Insufficient documentation

## 2020-02-14 DIAGNOSIS — K219 Gastro-esophageal reflux disease without esophagitis: Secondary | ICD-10-CM | POA: Diagnosis not present

## 2020-02-14 DIAGNOSIS — E1140 Type 2 diabetes mellitus with diabetic neuropathy, unspecified: Secondary | ICD-10-CM | POA: Insufficient documentation

## 2020-02-14 DIAGNOSIS — H5461 Unqualified visual loss, right eye, normal vision left eye: Secondary | ICD-10-CM | POA: Diagnosis present

## 2020-02-14 DIAGNOSIS — I69998 Other sequelae following unspecified cerebrovascular disease: Secondary | ICD-10-CM | POA: Diagnosis not present

## 2020-02-14 DIAGNOSIS — I10 Essential (primary) hypertension: Secondary | ICD-10-CM | POA: Diagnosis not present

## 2020-02-14 DIAGNOSIS — G2581 Restless legs syndrome: Secondary | ICD-10-CM | POA: Diagnosis present

## 2020-02-14 DIAGNOSIS — E1169 Type 2 diabetes mellitus with other specified complication: Secondary | ICD-10-CM | POA: Diagnosis not present

## 2020-02-14 DIAGNOSIS — Z91040 Latex allergy status: Secondary | ICD-10-CM | POA: Insufficient documentation

## 2020-02-14 DIAGNOSIS — F339 Major depressive disorder, recurrent, unspecified: Secondary | ICD-10-CM | POA: Diagnosis present

## 2020-02-14 DIAGNOSIS — Z6833 Body mass index (BMI) 33.0-33.9, adult: Secondary | ICD-10-CM | POA: Diagnosis not present

## 2020-02-14 DIAGNOSIS — E1159 Type 2 diabetes mellitus with other circulatory complications: Secondary | ICD-10-CM

## 2020-02-14 DIAGNOSIS — Z20822 Contact with and (suspected) exposure to covid-19: Secondary | ICD-10-CM | POA: Diagnosis not present

## 2020-02-14 DIAGNOSIS — I6389 Other cerebral infarction: Secondary | ICD-10-CM

## 2020-02-14 DIAGNOSIS — E785 Hyperlipidemia, unspecified: Secondary | ICD-10-CM | POA: Diagnosis not present

## 2020-02-14 DIAGNOSIS — E1142 Type 2 diabetes mellitus with diabetic polyneuropathy: Secondary | ICD-10-CM | POA: Diagnosis present

## 2020-02-14 DIAGNOSIS — F17200 Nicotine dependence, unspecified, uncomplicated: Secondary | ICD-10-CM | POA: Diagnosis present

## 2020-02-14 DIAGNOSIS — H547 Unspecified visual loss: Secondary | ICD-10-CM

## 2020-02-14 DIAGNOSIS — F411 Generalized anxiety disorder: Secondary | ICD-10-CM | POA: Diagnosis present

## 2020-02-14 HISTORY — DX: Cerebral infarction, unspecified: I63.9

## 2020-02-14 LAB — COMPREHENSIVE METABOLIC PANEL
ALT: 20 U/L (ref 0–44)
AST: 17 U/L (ref 15–41)
Albumin: 4.1 g/dL (ref 3.5–5.0)
Alkaline Phosphatase: 45 U/L (ref 38–126)
Anion gap: 14 (ref 5–15)
BUN: 17 mg/dL (ref 6–20)
CO2: 22 mmol/L (ref 22–32)
Calcium: 9.6 mg/dL (ref 8.9–10.3)
Chloride: 103 mmol/L (ref 98–111)
Creatinine, Ser: 1.27 mg/dL — ABNORMAL HIGH (ref 0.61–1.24)
GFR calc Af Amer: >60 mL/min (ref >60)
GFR calc non Af Amer: >60 mL/min (ref >60)
Glucose, Bld: 183 mg/dL — ABNORMAL HIGH (ref 70–99)
Potassium: 4.1 mmol/L (ref 3.5–5.1)
Sodium: 139 mmol/L (ref 135–145)
Total Bilirubin: 0.8 mg/dL (ref 0.3–1.2)
Total Protein: 7 g/dL (ref 6.5–8.1)

## 2020-02-14 LAB — URINALYSIS, ROUTINE W REFLEX MICROSCOPIC
Bacteria, UA: NONE SEEN
Bilirubin Urine: NEGATIVE
Glucose, UA: >=500 mg/dL — AB
Hgb urine dipstick: NEGATIVE
Ketones, ur: 5 mg/dL — AB
Leukocytes,Ua: NEGATIVE
Nitrite: NEGATIVE
Protein, ur: 30 mg/dL — AB
Specific Gravity, Urine: 1.014 (ref 1.005–1.030)
pH: 6 (ref 5.0–8.0)

## 2020-02-14 LAB — CBC
HCT: 51.7 % (ref 39.0–52.0)
Hemoglobin: 16.9 g/dL (ref 13.0–17.0)
MCH: 28.2 pg (ref 26.0–34.0)
MCHC: 32.7 g/dL (ref 30.0–36.0)
MCV: 86.2 fL (ref 80.0–100.0)
Platelets: 209 10*3/uL (ref 150–400)
RBC: 6 MIL/uL — ABNORMAL HIGH (ref 4.22–5.81)
RDW: 14.5 % (ref 11.5–15.5)
WBC: 9.9 10*3/uL (ref 4.0–10.5)
nRBC: 0 % (ref 0.0–0.2)

## 2020-02-14 LAB — DIFFERENTIAL
Abs Immature Granulocytes: 0.04 10*3/uL (ref 0.00–0.07)
Basophils Absolute: 0.1 10*3/uL (ref 0.0–0.1)
Basophils Relative: 1 %
Eosinophils Absolute: 0.3 10*3/uL (ref 0.0–0.5)
Eosinophils Relative: 3 %
Immature Granulocytes: 0 %
Lymphocytes Relative: 23 %
Lymphs Abs: 2.3 10*3/uL (ref 0.7–4.0)
Monocytes Absolute: 1 10*3/uL (ref 0.1–1.0)
Monocytes Relative: 10 %
Neutro Abs: 6.2 10*3/uL (ref 1.7–7.7)
Neutrophils Relative %: 63 %

## 2020-02-14 LAB — ECHOCARDIOGRAM COMPLETE
Area-P 1/2: 3.42 cm2
Height: 70 in
S' Lateral: 3.8 cm
Weight: 3680 oz

## 2020-02-14 LAB — CBG MONITORING, ED: Glucose-Capillary: 221 mg/dL — ABNORMAL HIGH (ref 70–99)

## 2020-02-14 LAB — I-STAT CHEM 8, ED
BUN: 22 mg/dL — ABNORMAL HIGH (ref 6–20)
Calcium, Ion: 1.1 mmol/L — ABNORMAL LOW (ref 1.15–1.40)
Chloride: 107 mmol/L (ref 98–111)
Creatinine, Ser: 1.1 mg/dL (ref 0.61–1.24)
Glucose, Bld: 181 mg/dL — ABNORMAL HIGH (ref 70–99)
HCT: 51 % (ref 39.0–52.0)
Hemoglobin: 17.3 g/dL — ABNORMAL HIGH (ref 13.0–17.0)
Potassium: 4 mmol/L (ref 3.5–5.1)
Sodium: 140 mmol/L (ref 135–145)
TCO2: 21 mmol/L — ABNORMAL LOW (ref 22–32)

## 2020-02-14 LAB — RAPID URINE DRUG SCREEN, HOSP PERFORMED
Amphetamines: NOT DETECTED
Barbiturates: NOT DETECTED
Benzodiazepines: NOT DETECTED
Cocaine: NOT DETECTED
Opiates: NOT DETECTED
Tetrahydrocannabinol: NOT DETECTED

## 2020-02-14 LAB — SARS CORONAVIRUS 2 BY RT PCR (HOSPITAL ORDER, PERFORMED IN ~~LOC~~ HOSPITAL LAB): SARS Coronavirus 2: NEGATIVE

## 2020-02-14 LAB — APTT: aPTT: 28 seconds (ref 24–36)

## 2020-02-14 LAB — PROTIME-INR
INR: 0.9 (ref 0.8–1.2)
Prothrombin Time: 12.2 seconds (ref 11.4–15.2)

## 2020-02-14 LAB — GLUCOSE, CAPILLARY: Glucose-Capillary: 249 mg/dL — ABNORMAL HIGH (ref 70–99)

## 2020-02-14 LAB — ETHANOL: Alcohol, Ethyl (B): <10 mg/dL (ref <10)

## 2020-02-14 MED ORDER — LORAZEPAM 1 MG PO TABS: 1.0000 mg | ORAL_TABLET | Freq: Once | ORAL | Status: DC

## 2020-02-14 MED ORDER — INSULIN ASPART 100 UNIT/ML ~~LOC~~ SOLN
0.0000 [IU] | Freq: Every day | SUBCUTANEOUS | Status: DC
  Administered 2020-02-14: 2 [IU] via SUBCUTANEOUS

## 2020-02-14 MED ORDER — ENOXAPARIN SODIUM 40 MG/0.4ML ~~LOC~~ SOLN
40.0000 mg | SUBCUTANEOUS | Status: DC
  Administered 2020-02-14 – 2020-02-15 (×2): 40 mg via SUBCUTANEOUS
  Filled 2020-02-14 (×2): qty 0.4

## 2020-02-14 MED ORDER — ASPIRIN 300 MG RE SUPP: 300.0000 mg | Freq: Every day | RECTAL | Status: DC

## 2020-02-14 MED ORDER — ROSUVASTATIN CALCIUM 20 MG PO TABS
40.0000 mg | ORAL_TABLET | Freq: Every day | ORAL | Status: DC
  Administered 2020-02-14: 40 mg via ORAL
  Filled 2020-02-14: qty 2

## 2020-02-14 MED ORDER — IOHEXOL 350 MG/ML SOLN
75.0000 mL | Freq: Once | INTRAVENOUS | Status: AC | PRN
  Administered 2020-02-14: 75 mL via INTRAVENOUS

## 2020-02-14 MED ORDER — ACETAMINOPHEN 650 MG RE SUPP: 650.0000 mg | RECTAL | Status: DC | PRN

## 2020-02-14 MED ORDER — ASPIRIN 325 MG PO TABS
325.0000 mg | ORAL_TABLET | Freq: Every day | ORAL | Status: DC
  Administered 2020-02-14 – 2020-02-15 (×2): 325 mg via ORAL
  Filled 2020-02-14 (×2): qty 1

## 2020-02-14 MED ORDER — ACETAMINOPHEN 325 MG PO TABS
650.0000 mg | ORAL_TABLET | ORAL | Status: DC | PRN
  Administered 2020-02-14 – 2020-02-15 (×6): 650 mg via ORAL
  Filled 2020-02-14 (×6): qty 2

## 2020-02-14 MED ORDER — STROKE: EARLY STAGES OF RECOVERY BOOK
Freq: Once | Status: AC
  Filled 2020-02-14 (×2): qty 1

## 2020-02-14 MED ORDER — INSULIN ASPART 100 UNIT/ML ~~LOC~~ SOLN
0.0000 [IU] | Freq: Three times a day (TID) | SUBCUTANEOUS | Status: DC
  Administered 2020-02-14 – 2020-02-15 (×2): 5 [IU] via SUBCUTANEOUS

## 2020-02-14 MED ORDER — ACETAMINOPHEN 160 MG/5ML PO SOLN: 650.0000 mg | ORAL | Status: DC | PRN

## 2020-02-14 NOTE — Progress Notes (Signed)
  Echocardiogram 2D Echocardiogram has been performed.  [name] [name] 02/14/2020, 2:05 PM

## 2020-02-14 NOTE — ED Notes (Signed)
Attempted to call nursing report to 3W 

## 2020-02-14 NOTE — ED Triage Notes (Signed)
Emergency Medicine Provider Triage Evaluation Note  [name] [name] , a 59 y.o. male  was evaluated in triage.  Pt complains of vision loss.  Patient states he woke up at 2 AM with a severe headache.  Since then, he has had abnormal vision of the right eye, cannot see out of it.  He went to the ophthalmologist today, they were concerned for stroke and sent him to the ER.  Review of Systems  Positive: HA, vision loss Negative: Numbness, weakness  Physical Exam  BP (!) 198/96 (BP Location: Left Arm)    Pulse 98    Temp 98.4 F (36.9 C) (Oral)    Resp 16    Ht 5\' 10"  (1.778 m)    Wt 104.3 kg    SpO2 100%    BMI 33.00 kg/m  Gen:   Awake, no distress   HEENT:  Atraumatic  Resp:  Normal effort Cardiac:  Normal rate  Abd:   Nondistended, nontender MSK:   Moves extremities without difficulty  Neuro:  Right-sided vision loss noted during visual field test.  Negative pronator drift.  Strength of upper extremities equal bilaterally.  Sensation of lower extremities equal bilaterally.  No other cranial nerve deficits.  Speech clear   Medical Decision Making  Medically screening exam initiated at 11:42 AM.  Appropriate orders placed.  was informed that the remainder of the evaluation will be completed by another provider, this initial triage assessment does not replace that evaluation, and the importance of remaining in the ED until their evaluation is complete.  Clinical Impression   Deficits starting at 2 AM this morning.  However due to acute vision loss, patient considered van positive, consider LVO stroke.  Code stroke called.    [name], PA-C 02/14/20 1144

## 2020-02-14 NOTE — ED Notes (Signed)
Secretary to page neurology

## 2020-02-14 NOTE — ED Notes (Signed)
Pt refusing MRI- Pt offered medication and talked to his wife, with much encouragement, pt still continues to refuse MRI. This nurse notified attending Pahwani MD

## 2020-02-14 NOTE — ED Notes (Signed)
This nurse spoke with neurology r/t previous assessment, also informed of pt refusing MRI , no new orders given,. Will continue to monitor.

## 2020-02-14 NOTE — Code Documentation (Signed)
Pt is a 59 yr old male who arrived to triage at 1134 with complaint of rt sided visual loss. The vision loss began at 0200 this morning. He went to the ophthalmologist who told him to come to ED. Code stroke was called at 1210. Pt had already had a normal NCCT at that time. His NIHSS was 2, for Right hemionopia. Additional IV access obtained, and CTA and CTP obtained at 1215. Upon completion of scans, pt went to ED room 5. NIHSS remains a 2. See flow sheet for VS. Treatment decision made at 1232: pt will be admitted and worked up for stroke. He is ineligible for TPA as he is outside of the treatment window. He is ineligible for IR mechanical thrombectomy as he is LVO negative. Pt and spouse updated by Dr Amada Jupiter at bedside. Pt will need continued q 2 hr VS and mNIHSS. Bedside handoff with Paulino Rily.

## 2020-02-14 NOTE — ED Triage Notes (Signed)
Reported woke up with a HA at 2am; and then loss vision on right side this morning.

## 2020-02-14 NOTE — ED Notes (Addendum)
Pt unable to tell nurse how old he is, able to answer most questions appropriately-This nurse notified attending Pahwani MD.

## 2020-02-14 NOTE — ED Provider Notes (Signed)
[facility] Provider Note   CSN: [id number] Arrival date & time: 02/14/20  1127     History Chief Complaint  Patient presents with  . Loss of Vision    Joen LauraHerbert Durley is a 59 y.o. male history of obesity, GERD, hypertension, hyperlipidemia, tobacco use, RLS, diabetes.  Patient presented to triage with vision loss started around 2 AM.  LVO positive.  Code stroke called by provider in triage, neurology evaluated the patient and took him to CT scanner immediately. - On my evaluation patient resting comfortably no acute distress sitting upright in bed airway intact.  He endorses headache and right eye peripheral vision loss at 2 AM this morning, sent to the ER for evaluation.  Patient with PCA occlusion, not IR candidate per neurology.  Patient states understanding of diagnosis and he is agreeable for admission.  Reports that at this time besides the vision loss he is feeling well, no other complaints.  Denies recent illness, fever/chills, chest pain/shortness of breath, difficulty speaking, numbness/weakness, tingling or any additional complaints.  HPI     Past Medical History:  Diagnosis Date  . Angio-edema   . Anxiety   . Depression   . Diabetes mellitus type II    type 2  . GERD (gastroesophageal reflux disease)   . Headache    migraine  . History of kidney stones   . HLD (hyperlipidemia)   . HTN (hypertension)   . Morbid obesity (HCC)   . Restless legs syndrome (RLS) 11/20/2012  . Tobacco abuse   . Ulcer of left ankle (HCC)    last 2 months dime size dry dressing changing q day  . Urticaria     Patient Active Problem List   Diagnosis Date Noted  . Depression   . GERD (gastroesophageal reflux disease)   . Ischemic stroke (HCC)   . Diabetic polyneuropathy associated with type 2 diabetes mellitus (HCC) 06/30/2018  . Other chronic pain 06/30/2018  . Erectile dysfunction due to type 2 diabetes mellitus (HCC) 06/30/2018  .  Circulation disorder of lower extremity 05/31/2015  . IBS (irritable bowel syndrome) 12/24/2014  . 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 Diabetic leg ulcer (HCC) 07/18/2013  . Restless legs syndrome (RLS) 11/20/2012  . Generalized anxiety disorder   . Diabetes mellitus, type II (HCC)   . Hyperlipidemia LDL goal <70 01/16/2009  . TOBACCO ABUSE 07/27/2007  . Morbid obesity (HCC) 09/29/2006  . Essential hypertension 09/29/2006  . CALCULUS, KIDNEY 05/30/2002    Past Surgical History:  Procedure Laterality Date  . Arm surgery Right 1969   Abscess excision and debridement at elbow  . COLONOSCOPY WITH PROPOFOL N/A 09/06/2016   Procedure: COLONOSCOPY WITH PROPOFOL;  Surgeon: Sherrilyn RistHenry L Danis III, MD;  Location: [facility];  Service: Gastroenterology;  Laterality: N/A;  . I & D EXTREMITY Left 05/29/2014   Procedure: IRRIGATION AND DEBRIDEMENT OF LEFT LEG WOUND AND PLACEMENT OF  INTEGRA  AND  VAC;  Surgeon: Wayland Denislaire Sanger, DO;  Location: [facility];  Service: Plastics;  Laterality: Left;  . INCISION AND DRAINAGE OF WOUND Left 04/23/2014   Procedure: IRRIGATION AND DEBRIDEMENT LOWER LEFT LEG WOUND WITH PLACEMENT OF INTEGRA AND VAC;  Surgeon: Wayland Denislaire Sanger, DO;  Location: [facility];  Service: Plastics;  Laterality: Left;  . kidney stone removal  2004  . MINOR APPLICATION OF WOUND VAC Left 04/23/2014   Procedure: MINOR APPLICATION OF WOUND VAC;  Surgeon:  Claire Sanger, DO;  Location: [facility];  Service: Plastics;  Laterality: Left;  . SKIN SPLIT GRAFT Left 06/18/2014   Procedure: SKIN GRAFT SPLIT THICKNESS TO LOWER LEFT LEG WOUND WITH PLACEMENT OF VAC;  Surgeon: Wayland Denis, DO;  Location: [facility];  Service: Plastics;  Laterality: Left;  Marland Kitchen VASECTOMY  1994       Family History  Problem Relation Age of Onset  . Stroke Mother        multiple  . Leukemia Mother   . Pneumonia Mother   . Cancer Mother    . Diabetes Mother   . Diabetes Father   . Heart disease Father   . Hyperlipidemia Father   . Hypertension Father   . Allergic rhinitis Neg Hx   . Angioedema Neg Hx   . Asthma Neg Hx   . Eczema Neg Hx   . Immunodeficiency Neg Hx   . Urticaria Neg Hx     Social History   Tobacco Use  . Smoking status: Current Every Day Smoker    Packs/day: 0.25    Years: 40.00    Pack years: 10.00    Types: Cigarettes  . Smokeless tobacco: Never Used  . Tobacco comment: one cigarette daily  Substance Use Topics  . Alcohol use: Yes    Alcohol/week: 0.0 standard drinks    Comment: rarely  . Drug use: No    Home Medications Prior to Admission medications   Medication Sig Start Date End Date Taking? Authorizing Provider  bromocriptine (PARLODEL) 2.5 MG tablet Take 0.5 tablets (1.25 mg total) by mouth at bedtime. 09/13/19   Romero Belling, MD  colesevelam [facility]) 625 MG tablet Take 2 tablets (1,250 mg total) by mouth daily. 09/13/19   Romero Belling, MD  dapagliflozin propanediol (FARXIGA) 10 MG TABS tablet Take 10 mg by mouth daily before breakfast. 03/15/19   Romero Belling, MD  Dulaglutide (TRULICITY) 4.5 MG/0.5ML SOPN Inject 4.5 mg into the skin once a week. 07/01/19   Romero Belling, MD  EPINEPHrine 0.3 mg/0.3 mL IJ SOAJ injection INJECT 0.3 ML INTO THE  MUSCLE ONCE FOR 1 DOSE. 10/14/19   Copland, Karleen Hampshire, MD  gabapentin (NEURONTIN) 300 MG capsule TAKE 3 CAPSULES BY MOUTH 3  TIMES DAILY 07/20/19   Copland, Karleen Hampshire, MD  glipiZIDE (GLUCOTROL XL) 10 MG 24 hr tablet TAKE 2 TABLETS BY MOUTH  DAILY WITH BREAKFAST 01/14/20   Copland, Karleen Hampshire, MD  glucose blood (CONTOUR NEXT TEST) test strip Use to check blood sugar two times a day. Patient taking differently: 1 each by Other route 2 (two) times daily. E11.9 07/08/19   Copland, Karleen Hampshire, MD  Lancets Micro Thin 33G MISC Use to check blood sugar two times daily. Patient taking differently: 1 each by Other route 2 (two) times daily. E11.9 06/17/19   Copland,  Karleen Hampshire, MD  lisinopril-hydrochlorothiazide (ZESTORETIC) 20-12.5 MG tablet Take 1 tablet by mouth daily. 09/06/19   Copland, Karleen Hampshire, MD  meloxicam (MOBIC) 15 MG tablet TAKE 1 TABLET BY MOUTH IN  THE MORNING 07/20/19   Copland, Karleen Hampshire, MD  metFORMIN (GLUCOPHAGE-XR) 500 MG 24 hr tablet TAKE 4 TABLETS BY MOUTH  DAILY WITH BREAKFAST 07/20/19   Copland, Karleen Hampshire, MD  Multiple Vitamin (MULTIVITAMIN) tablet Take 1 tablet by mouth daily.      [provider]  pantoprazole (PROTONIX) 40 MG tablet TAKE 1 TABLET BY MOUTH  DAILY 07/20/19   Copland, Karleen Hampshire, MD  PARoxetine (PAXIL) 30 MG tablet TAKE 1 TABLET BY MOUTH IN  THE MORNING 07/20/19   Copland, Karleen Hampshire, MD  pentoxifylline (TRENTAL) 400 MG CR tablet TAKE 1 TABLET BY MOUTH 3  TIMES DAILY WITH MEALS 07/20/19   Copland, Karleen Hampshire, MD  rOPINIRole (REQUIP) 0.5 MG tablet TAKE 1 TABLET BY MOUTH  TWICE DAILY 07/20/19   Copland, Karleen Hampshire, MD  rosuvastatin (CRESTOR) 40 MG tablet TAKE 1 TABLET BY MOUTH  DAILY 07/20/19   Copland, Karleen Hampshire, MD  sildenafil (VIAGRA) 100 MG tablet Take 1 tablet (100 mg total) by mouth daily as needed for erectile dysfunction. 09/05/19   Copland, Karleen Hampshire, MD  varenicline (CHANTIX) 1 MG tablet Take 1 tablet (1 mg total) by mouth 2 (two) times daily. 03/28/19   Copland, Karleen Hampshire, MD  vitamin C (ASCORBIC ACID) 500 MG tablet Take 500 mg by mouth daily.    [provider]    Allergies    Cashew nut oil, Pistachio nut (diagnostic), Betadine [povidone iodine], Latex, Penicillins, and Lidocaine  Review of Systems   Review of Systems Ten systems are reviewed and are negative for acute change except as noted in the HPI  Physical Exam Updated Vital Signs BP (!) 142/96   Pulse 93   Temp 98 F (36.7 C) (Oral)   Resp (!) 22   Ht 5\' 10"  (1.778 m)   Wt 104.3 kg   SpO2 95%   BMI 33.00 kg/m   Physical Exam Constitutional:      General: He is not in acute distress.    Appearance: Normal appearance. He is well-developed. He is obese. He  is not ill-appearing or diaphoretic.  HENT:     Head: Normocephalic and atraumatic.  Eyes:     General: Vision grossly intact. Gaze aligned appropriately.     Pupils: Pupils are equal, round, and reactive to light.  Neck:     Trachea: Trachea and phonation normal.  Pulmonary:     Effort: Pulmonary effort is normal. No respiratory distress.  Abdominal:     General: There is no distension.     Palpations: Abdomen is soft.     Tenderness: There is no abdominal tenderness. There is no guarding or rebound.  Musculoskeletal:        General: Normal range of motion.     Cervical back: Normal range of motion.  Skin:    General: Skin is warm and dry.     Comments: Patient has what appears to be venous stasis of the bilateral lower extremities, symmetric in appearance.  He has a small scab present to the right shin he reports happened when he kicked a barstool the other day, no evidence of infection at this time.  Neurological:     Mental Status: He is alert.     GCS: GCS eye subscore is 4. GCS verbal subscore is 5. GCS motor subscore is 6.     Comments: Speech is clear and goal oriented, follows commands Right eye peripheral vision loss, otherwise cranial nerves intact.  Normal strength in upper and lower extremities bilaterally including dorsiflexion and plantar flexion, strong and equal grip strength Sensation intact to extremities x4 Moves extremities without ataxia, coordination intact  Psychiatric:        Behavior: Behavior normal.    ED Results / Procedures / Treatments   Labs (all labs ordered are listed, but only abnormal results are displayed) Labs Reviewed  CBC - Abnormal; Notable for the following components:      Result Value   RBC 6.00 (*)    All other components within normal limits  COMPREHENSIVE METABOLIC PANEL - Abnormal; Notable for the following components:   Glucose, Bld 183 (*)    Creatinine, Ser 1.27 (*)    All other components within normal limits  I-STAT CHEM  8, ED - Abnormal; Notable for the following components:   BUN 22 (*)    Glucose, Bld 181 (*)    Calcium, Ion 1.10 (*)    TCO2 21 (*)    Hemoglobin 17.3 (*)    All other components within normal limits  SARS CORONAVIRUS 2 BY RT PCR (HOSPITAL ORDER, PERFORMED IN [facility])  ETHANOL  PROTIME-INR  APTT  DIFFERENTIAL  RAPID URINE DRUG SCREEN, HOSP PERFORMED  URINALYSIS, ROUTINE W REFLEX MICROSCOPIC    EKG EKG Interpretation  Date/Time:  Friday February 14 2020 12:31:20 EDT Ventricular Rate:  98 PR Interval:    QRS Duration: 134 QT Interval:  381 QTC Calculation: 487 R Axis:   -87 Text Interpretation: Sinus rhythm Right bundle branch block Anterolateral infarct, old Confirmed by Vanetta Mulders [phone]) on 02/14/2020 12:34:24 PM   Radiology CT Angio Head W or Wo Contrast  Result Date: 02/14/2020 CLINICAL DATA:  Visual disturbance.  Acute stroke suspected. EXAM: CT ANGIOGRAPHY HEAD AND NECK CT PERFUSION BRAIN TECHNIQUE: Multidetector CT imaging of the head and neck was performed using the standard protocol during bolus administration of intravenous contrast. Multiplanar CT image reconstructions and MIPs were obtained to evaluate the vascular anatomy. Carotid stenosis measurements (when applicable) are obtained utilizing NASCET criteria, using the distal internal carotid diameter as the denominator. Multiphase CT imaging of the brain was performed following IV bolus contrast injection. Subsequent parametric perfusion maps were calculated using RAPID software. CONTRAST:  83mL OMNIPAQUE IOHEXOL 350 MG/ML SOLN COMPARISON:  Head CT earlier same day FINDINGS: CTA NECK FINDINGS Aortic arch: Aortic atherosclerosis.  Branching pattern is normal. Right carotid system: Common carotid artery widely patent to the bifurcation. Carotid bifurcation is normal without soft or calcified plaque. No stenosis. Left carotid system: Common carotid artery widely patent to the bifurcation. Soft and  calcified plaque at the carotid bifurcation. No stenosis compared to the diameter of the more distal cervical ICA. Vertebral arteries: Both vertebral artery origins are widely patent. Both vertebral arteries appear normal through the cervical region to the foramen magnum. Skeleton: Ordinary cervical spondylosis. Other neck: No mass or lymphadenopathy. Upper chest: Mild scarring at the lung apices. Review of the MIP images confirms the above findings CTA HEAD FINDINGS Anterior circulation: Both internal carotid arteries widely patent through the skull base and siphon regions. Mild atherosclerotic calcification of the carotid siphons but no stenosis. The anterior and middle cerebral vessels are patent. No large or medium vessel occlusion. No correctable proximal stenosis. Posterior circulation: Both vertebral arteries widely patent to the basilar. No basilar stenosis. Posterior circulation branch vessels are patent. Some distal atherosclerotic irregularity in the PCA branches. Venous sinuses: Patent and normal. Anatomic variants: None significant. Review of the MIP images confirms the above findings CT Brain Perfusion Findings: ASPECTS: 9 CBF (<30%) Volume: 43mL Perfusion (Tmax>6.0s) volume: 24mL Mismatch Volume: 69mL Infarction Location:Left occipital cortex IMPRESSION: 1. 10 CC region of completed infarction in the left occipital cortex. Additional 11 cc of at risk adjacent penumbra. 2. No large or medium vessel occlusion visible. Patient does have atherosclerotic irregularity of the more distal PCA branches. 3. Atherosclerotic disease at the left carotid bifurcation but without stenosis. 4. Aortic atherosclerosis. Aortic Atherosclerosis (ICD10-I70.0). Electronically Signed   By: Paulina Fusi M.D.   On: 02/14/2020  12:47   CT Angio Neck W and/or Wo Contrast  Result Date: 02/14/2020 CLINICAL DATA:  Visual disturbance.  Acute stroke suspected. EXAM: CT ANGIOGRAPHY HEAD AND NECK CT PERFUSION BRAIN TECHNIQUE:  Multidetector CT imaging of the head and neck was performed using the standard protocol during bolus administration of intravenous contrast. Multiplanar CT image reconstructions and MIPs were obtained to evaluate the vascular anatomy. Carotid stenosis measurements (when applicable) are obtained utilizing NASCET criteria, using the distal internal carotid diameter as the denominator. Multiphase CT imaging of the brain was performed following IV bolus contrast injection. Subsequent parametric perfusion maps were calculated using RAPID software. CONTRAST:  75mL OMNIPAQUE IOHEXOL 350 MG/ML SOLN COMPARISON:  Head CT earlier same day FINDINGS: CTA NECK FINDINGS Aortic arch: Aortic atherosclerosis.  Branching pattern is normal. Right carotid system: Common carotid artery widely patent to the bifurcation. Carotid bifurcation is normal without soft or calcified plaque. No stenosis. Left carotid system: Common carotid artery widely patent to the bifurcation. Soft and calcified plaque at the carotid bifurcation. No stenosis compared to the diameter of the more distal cervical ICA. Vertebral arteries: Both vertebral artery origins are widely patent. Both vertebral arteries appear normal through the cervical region to the foramen magnum. Skeleton: Ordinary cervical spondylosis. Other neck: No mass or lymphadenopathy. Upper chest: Mild scarring at the lung apices. Review of the MIP images confirms the above findings CTA HEAD FINDINGS Anterior circulation: Both internal carotid arteries widely patent through the skull base and siphon regions. Mild atherosclerotic calcification of the carotid siphons but no stenosis. The anterior and middle cerebral vessels are patent. No large or medium vessel occlusion. No correctable proximal stenosis. Posterior circulation: Both vertebral arteries widely patent to the basilar. No basilar stenosis. Posterior circulation branch vessels are patent. Some distal atherosclerotic irregularity in the  PCA branches. Venous sinuses: Patent and normal. Anatomic variants: None significant. Review of the MIP images confirms the above findings CT Brain Perfusion Findings: ASPECTS: 9 CBF (<30%) Volume: 10mL Perfusion (Tmax>6.0s) volume: 21mL Mismatch Volume: 11mL Infarction Location:Left occipital cortex IMPRESSION: 1. 10 CC region of completed infarction in the left occipital cortex. Additional 11 cc of at risk adjacent penumbra. 2. No large or medium vessel occlusion visible. Patient does have atherosclerotic irregularity of the more distal PCA branches. 3. Atherosclerotic disease at the left carotid bifurcation but without stenosis. 4. Aortic atherosclerosis. Aortic Atherosclerosis (ICD10-I70.0). Electronically Signed   By: Paulina Fusi M.D.   On: 02/14/2020 12:47   CT Code Stroke Cerebral Perfusion with contrast  Result Date: 02/14/2020 CLINICAL DATA:  Visual disturbance.  Acute stroke suspected. EXAM: CT ANGIOGRAPHY HEAD AND NECK CT PERFUSION BRAIN TECHNIQUE: Multidetector CT imaging of the head and neck was performed using the standard protocol during bolus administration of intravenous contrast. Multiplanar CT image reconstructions and MIPs were obtained to evaluate the vascular anatomy. Carotid stenosis measurements (when applicable) are obtained utilizing NASCET criteria, using the distal internal carotid diameter as the denominator. Multiphase CT imaging of the brain was performed following IV bolus contrast injection. Subsequent parametric perfusion maps were calculated using RAPID software. CONTRAST:  75mL OMNIPAQUE IOHEXOL 350 MG/ML SOLN COMPARISON:  Head CT earlier same day FINDINGS: CTA NECK FINDINGS Aortic arch: Aortic atherosclerosis.  Branching pattern is normal. Right carotid system: Common carotid artery widely patent to the bifurcation. Carotid bifurcation is normal without soft or calcified plaque. No stenosis. Left carotid system: Common carotid artery widely patent to the bifurcation. Soft  and calcified plaque at the carotid bifurcation. No stenosis  compared to the diameter of the more distal cervical ICA. Vertebral arteries: Both vertebral artery origins are widely patent. Both vertebral arteries appear normal through the cervical region to the foramen magnum. Skeleton: Ordinary cervical spondylosis. Other neck: No mass or lymphadenopathy. Upper chest: Mild scarring at the lung apices. Review of the MIP images confirms the above findings CTA HEAD FINDINGS Anterior circulation: Both internal carotid arteries widely patent through the skull base and siphon regions. Mild atherosclerotic calcification of the carotid siphons but no stenosis. The anterior and middle cerebral vessels are patent. No large or medium vessel occlusion. No correctable proximal stenosis. Posterior circulation: Both vertebral arteries widely patent to the basilar. No basilar stenosis. Posterior circulation branch vessels are patent. Some distal atherosclerotic irregularity in the PCA branches. Venous sinuses: Patent and normal. Anatomic variants: None significant. Review of the MIP images confirms the above findings CT Brain Perfusion Findings: ASPECTS: 9 CBF (<30%) Volume: 10mL Perfusion (Tmax>6.0s) volume: 21mL Mismatch Volume: 11mL Infarction Location:Left occipital cortex IMPRESSION: 1. 10 CC region of completed infarction in the left occipital cortex. Additional 11 cc of at risk adjacent penumbra. 2. No large or medium vessel occlusion visible. Patient does have atherosclerotic irregularity of the more distal PCA branches. 3. Atherosclerotic disease at the left carotid bifurcation but without stenosis. 4. Aortic atherosclerosis. Aortic Atherosclerosis (ICD10-I70.0). Electronically Signed   By: Paulina Fusi M.D.   On: 02/14/2020 12:47   CT HEAD CODE STROKE WO CONTRAST  Result Date: 02/14/2020 CLINICAL DATA:  Code stroke.  Acute left-sided weakness. EXAM: CT HEAD WITHOUT CONTRAST TECHNIQUE: Contiguous axial images were  obtained from the base of the skull through the vertex without intravenous contrast. COMPARISON:  None. FINDINGS: Brain: Generalized atrophy. No focal finding affects the brainstem. Old small vessel infarction in the right cerebellum. Old appearing infarction in the right occipital lobe. No CT sign of acute parenchymal infarction elsewhere in the cerebral hemispheres. See below. No hydrocephalus or extra-axial collection. Vascular: Question hyperdense right MCA.  This is not definite. Skull: Normal Sinuses/Orbits: Left maxillary and ethmoid sinus opacification. Possible odontogenic related to dental and periodontal disease. Other: None ASPECTS (Alberta Stroke Program Early CT Score) - Ganglionic level infarction (caudate, lentiform nuclei, internal capsule, insula, M1-M3 cortex): 7 - Supraganglionic infarction (M4-M6 cortex): 3 Total score (0-10 with 10 being normal): 10 IMPRESSION: 1. Question hyperdense right MCA. This is not definite. 2. No CT sign of acute parenchymal infarction elsewhere in the cerebral hemispheres. Old small vessel infarction in the right cerebellum. Old appearing infarction in the right occipital lobe. 3. ASPECTS is 10. 4. Left maxillary and ethmoid sinus opacification. Possible odontogenic related to dental and periodontal disease. 5. These results were communicated to Dr. Amada Jupiter at 12:13 pmon 9/17/2021by text page via the [facility] messaging system. Electronically Signed   By: Paulina Fusi M.D.   On: 02/14/2020 12:15    Procedures .Critical Care Performed by: Bill Salinas, PA-C Authorized by: Bill Salinas, PA-C   Critical care provider statement:    Critical care time (minutes):  35   Critical care was necessary to treat or prevent imminent or life-threatening deterioration of the following conditions:  CNS failure or compromise   Critical care was time spent personally by me on the following activities:  Discussions with consultants, evaluation of patient's response  to treatment, examination of patient, ordering and performing treatments and interventions, ordering and review of laboratory studies, ordering and review of radiographic studies, pulse oximetry, re-evaluation of patient's condition, obtaining history from  patient or surrogate, review of old charts and development of treatment plan with patient or surrogate   (including critical care time)  Medications Ordered in ED Medications  iohexol (OMNIPAQUE) 350 MG/ML injection 75 mL (75 mLs Intravenous Contrast Given 02/14/20 1239)    ED Course  I have reviewed the triage vital signs and the nursing notes.  Pertinent labs & imaging results that were available during my care of the patient were reviewed by me and considered in my medical decision making (see chart for details).    MDM Rules/Calculators/A&P                          Additional history obtained from: 1. Nursing notes from this visit. 2. Electronic medical record. 3. Family at bedside. ------------------------------------------ I reviewed and interpreted labs which include: Ethanol negative, patient does not appear intoxicated or in withdrawal. APTT and PT/INR within normal limits. CMP shows baseline creatinine of 1.27, hyperglycemia of 183.  No emergent electrolyte derangement, AKI, LFT elevations or gap. CBC without anemia or leukocytosis to suggest infection. UDS pending Urinalysis pending Covid test pending  EKG: Sinus rhythm Right bundle branch block Anterolateral infarct, old Confirmed by Vanetta Mulders [phone]) on 02/14/2020 12:34:24 PM  CT Head:  IMPRESSION:  1. Question hyperdense right MCA. This is not definite.  2. No CT sign of acute parenchymal infarction elsewhere in the  cerebral hemispheres. Old small vessel infarction in the right  cerebellum. Old appearing infarction in the right occipital lobe.  3. ASPECTS is 10.  4. Left maxillary and ethmoid sinus opacification. Possible  odontogenic related to dental  and periodontal disease.  5. These results were communicated to Dr. Amada Jupiter at 12:13 pmon  9/17/2021by text page via the [facility] messaging system.   CT angio head/neck/perfusion: IMPRESSION:  1. 10 CC region of completed infarction in the left occipital  cortex. Additional 11 cc of at risk adjacent penumbra.  2. No large or medium vessel occlusion visible. Patient does have  atherosclerotic irregularity of the more distal PCA branches.  3. Atherosclerotic disease at the left carotid bifurcation but  without stenosis.  4. Aortic atherosclerosis.    Aortic Atherosclerosis (ICD10-I70.0).  ----- Patient was reevaluated multiple times during this visit, airway remains intact, vital signs stable.  Discussed case with neurologist Dr. Amada Jupiter.  Dr. Amada Jupiter devices patient is not an IR or TPA candidate, asked that patient be admitted to medicine and neurology to follow. - 1:06 PM: Discussed case with Dr. Rhae Hammock, patient accepted to hospitalist service.   Note: Portions of this report may have been transcribed using voice recognition software. Every effort was made to ensure accuracy; however, inadvertent computerized transcription errors may still be present. Final Clinical Impression(s) / ED Diagnoses Final diagnoses:  Visual loss  Cerebrovascular accident (CVA), unspecified mechanism [facility])    Rx / DC Orders ED Discharge Orders    None       Elizabeth Palau 02/14/20 1423    Vanetta Mulders, MD 03/06/20 1630

## 2020-02-14 NOTE — Consult Note (Addendum)
Neurology Consultation  Reason for Consult: Code stroke Referring Physician: Alveria ApleySophia Caccavale, PA-C  CC: R hemianopia    History is obtained from: patient  HPI: [name] [name] is a 59 y.o. male pertinent risk factor in PHx  Morbid obesity, GERD, HTN, HLD, tobacco use (1/2 ppd), DM2 with neuropathy, RLS, chronic venous stasis ulcers in BLE presents with R sided vision loss.   LKN 0200 when he awoke with severe headache, reports since then he has had difficulty with vision in his R eye. Patient was seen by his ophthalmologist this morning and was advised to present to the ED to eval for possible stroke. Code stroke was initiated, neurology consulted, concern for positive LVO.   CTA head/neck and CTP suggestive of PCA occlusion and 10cc completed infarct of L occipital cortex and additional 11 cc at risk/penumbra. Negative LVO. Patient not an tpa or IR candidate. Plan to admit for stroke work up to hospitalist service.    LKW: 0200 tpa given?: no   Past Medical History:  Diagnosis Date  . Angio-edema   . Anxiety   . Depression   . Diabetes mellitus type II    type 2  . GERD (gastroesophageal reflux disease)   . Headache    migraine  . History of kidney stones   . HLD (hyperlipidemia)   . HTN (hypertension)   . Morbid obesity (HCC)   . Restless legs syndrome (RLS) 11/20/2012  . Tobacco abuse   . Ulcer of left ankle (HCC)    last 2 months dime size dry dressing changing q day  . Urticaria     2-Slight disability-UNABLE to perform all activities but does not need assistance Essential (primary) hypertension, Type 2 diabetes mellitus with hyperglycemia  and Morbid Obesity(BMI > 40)   Family History  Problem Relation Age of Onset  . Stroke Mother        multiple  . Leukemia Mother   . Pneumonia Mother   . Cancer Mother   . Diabetes Mother   . Diabetes Father   . Heart disease Father   . Hyperlipidemia Father   . Hypertension Father   . Allergic rhinitis Neg Hx   .  Angioedema Neg Hx   . Asthma Neg Hx   . Eczema Neg Hx   . Immunodeficiency Neg Hx   . Urticaria Neg Hx     Social History:   reports that he has been smoking cigarettes. He has a 10.00 pack-year smoking history. He has never used smokeless tobacco. He reports current alcohol use. He reports that he does not use drugs.  Medications  Current Facility-Administered Medications:  .  acetaminophen (TYLENOL) tablet 650 mg, 650 mg, Oral, Q4H PRN **OR** acetaminophen (TYLENOL) 160 MG/5ML solution 650 mg, 650 mg, Per Tube, Q4H PRN **OR** acetaminophen (TYLENOL) suppository 650 mg, 650 mg, Rectal, Q4H PRN, Pahwani, Rinka R, MD .  aspirin suppository 300 mg, 300 mg, Rectal, Daily **OR** aspirin tablet 325 mg, 325 mg, Oral, Daily, Pahwani, Rinka R, MD, 325 mg at 02/14/20 1534 .  enoxaparin (LOVENOX) injection 40 mg, 40 mg, Subcutaneous, Q24H, Pahwani, Rinka R, MD, 40 mg at 02/14/20 1535 .  insulin aspart (novoLOG) injection 0-15 Units, 0-15 Units, Subcutaneous, TID WC, Pahwani, Rinka R, MD .  insulin aspart (novoLOG) injection 0-5 Units, 0-5 Units, Subcutaneous, QHS, Pahwani, Rinka R, MD .  LORazepam (ATIVAN) tablet 1 mg, 1 mg, Oral, Once, Pahwani, Rinka R, MD  Current Outpatient Medications:  .  bromocriptine (PARLODEL) 2.5 MG tablet,  Take 0.5 tablets (1.25 mg total) by mouth at bedtime., Disp: 45 tablet, Rfl: 3 .  colesevelam (WELCHOL) 625 MG tablet, Take 2 tablets (1,250 mg total) by mouth daily., Disp: 60 tablet, Rfl: 11 .  dapagliflozin propanediol (FARXIGA) 10 MG TABS tablet, Take 10 mg by mouth daily before breakfast., Disp: 30 tablet, Rfl: 11 .  Dulaglutide (TRULICITY) 4.5 MG/0.5ML SOPN, Inject 4.5 mg into the skin once a week., Disp: 12 pen, Rfl: 3 .  EPINEPHrine 0.3 mg/0.3 mL IJ SOAJ injection, INJECT 0.3 ML INTO THE  MUSCLE ONCE FOR 1 DOSE. (Patient taking differently: Inject 0.3 mg into the muscle as needed for anaphylaxis. ), Disp: 2 each, Rfl: 0 .  gabapentin (NEURONTIN) 300 MG capsule,  TAKE 3 CAPSULES BY MOUTH 3  TIMES DAILY (Patient taking differently: Take 900 mg by mouth 3 (three) times daily. ), Disp: 810 capsule, Rfl: 3 .  glipiZIDE (GLUCOTROL XL) 10 MG 24 hr tablet, TAKE 2 TABLETS BY MOUTH  DAILY WITH BREAKFAST (Patient taking differently: Take 20 mg by mouth daily with breakfast. ), Disp: 180 tablet, Rfl: 1 .  glucose blood (CONTOUR NEXT TEST) test strip, Use to check blood sugar two times a day. (Patient taking differently: 1 each by Other route 2 (two) times daily. E11.9), Disp: 100 each, Rfl: 5 .  Lancets Micro Thin 33G MISC, Use to check blood sugar two times daily. (Patient taking differently: 1 each by Other route 2 (two) times daily. E11.9), Disp: 100 each, Rfl: 5 .  lisinopril-hydrochlorothiazide (ZESTORETIC) 20-12.5 MG tablet, Take 1 tablet by mouth daily., Disp: 90 tablet, Rfl: 3 .  meloxicam (MOBIC) 15 MG tablet, TAKE 1 TABLET BY MOUTH IN  THE MORNING (Patient taking differently: Take 15 mg by mouth every morning. ), Disp: 90 tablet, Rfl: 3 .  metFORMIN (GLUCOPHAGE-XR) 500 MG 24 hr tablet, TAKE 4 TABLETS BY MOUTH  DAILY WITH BREAKFAST (Patient taking differently: Take 2,000 mg by mouth daily with breakfast. ), Disp: 360 tablet, Rfl: 3 .  Multiple Vitamin (MULTIVITAMIN) tablet, Take 1 tablet by mouth daily.  , Disp: , Rfl:  .  pantoprazole (PROTONIX) 40 MG tablet, TAKE 1 TABLET BY MOUTH  DAILY (Patient taking differently: Take 40 mg by mouth daily. ), Disp: 90 tablet, Rfl: 3 .  PARoxetine (PAXIL) 30 MG tablet, TAKE 1 TABLET BY MOUTH IN  THE MORNING (Patient taking differently: Take 30 mg by mouth every morning. ), Disp: 90 tablet, Rfl: 3 .  pentoxifylline (TRENTAL) 400 MG CR tablet, TAKE 1 TABLET BY MOUTH 3  TIMES DAILY WITH MEALS (Patient taking differently: Take 400 mg by mouth 3 (three) times daily with meals. ), Disp: 270 tablet, Rfl: 3 .  rOPINIRole (REQUIP) 0.5 MG tablet, TAKE 1 TABLET BY MOUTH  TWICE DAILY (Patient taking differently: Take 0.5 mg by mouth in the  morning and at bedtime. ), Disp: 180 tablet, Rfl: 3 .  rosuvastatin (CRESTOR) 40 MG tablet, TAKE 1 TABLET BY MOUTH  DAILY (Patient taking differently: Take 40 mg by mouth daily. ), Disp: 90 tablet, Rfl: 3 .  sildenafil (VIAGRA) 100 MG tablet, Take 1 tablet (100 mg total) by mouth daily as needed for erectile dysfunction., Disp: 10 tablet, Rfl: 5 .  varenicline (CHANTIX) 1 MG tablet, Take 1 tablet (1 mg total) by mouth 2 (two) times daily., Disp: 180 tablet, Rfl: 1 .  vitamin C (ASCORBIC ACID) 500 MG tablet, Take 500 mg by mouth daily., Disp: , Rfl:   ROS:  General ROS:  negative for - chills, fatigue, fever, night sweats, weight gain or weight loss Psychological ROS: negative for - behavioral disorder, hallucinations, memory difficulties, mood swings or suicidal ideation Ophthalmic ROS: negative for - blurry vision, double vision, eye pain or loss of vision ENT ROS: negative for - epistaxis, nasal discharge, oral lesions, sore throat, tinnitus or vertigo Allergy and Immunology ROS: negative for - hives or itchy/watery eyes Hematological and Lymphatic ROS: negative for - bleeding problems, bruising or swollen lymph nodes Endocrine ROS: negative for - galactorrhea, hair pattern changes, polydipsia/polyuria or temperature intolerance Respiratory ROS: negative for - cough, hemoptysis, shortness of breath or wheezing Cardiovascular ROS: negative for - chest pain, dyspnea on exertion, edema or irregular heartbeat Gastrointestinal ROS: negative for - abdominal pain, diarrhea, hematemesis, nausea/vomiting or stool incontinence Genito-Urinary ROS: negative for - dysuria, hematuria, incontinence or urinary frequency/urgency Musculoskeletal ROS: negative for - joint swelling or muscular weakness Neurological ROS: as noted in HPI Dermatological ROS: chronic venous stasis ulcers BLE  Exam: Current vital signs: BP (!) 157/108   Pulse 93   Temp 98 F (36.7 C) (Oral)   Resp (!) 23   Ht 5\' 10"  (1.778 m)    Wt 104.3 kg   SpO2 95%   BMI 33.00 kg/m  Vital signs in last 24 hours: Temp:  [98 F (36.7 C)-98.4 F (36.9 C)] 98 F (36.7 C) (09/17 1232) Pulse Rate:  [92-98] 93 (09/17 1415) Resp:  [16-29] 23 (09/17 1345) BP: (142-198)/(96-110) 157/108 (09/17 1415) SpO2:  [95 %-100 %] 95 % (09/17 1415) Weight:  [104.3 kg] 104.3 kg (09/17 1139)   Constitutional: Appears obese Psych: Affect appropriate to situation Eyes: No scleral injection HENT: No OP obstrucion Head: Normocephalic.  Cardiovascular: Normal rate and regular rhythm.  Respiratory: Effort normal, non-labored breathing GI: Soft.  No distension. There is no tenderness.  Skin: chronic venous stasis ulcers BLE  Neuro: Mental Status: Patient is awake, alert, oriented to person, place, month, year, and situation. Speech- intact naming, repeating, comprehension Patient is able to give a clear and coherent history. Cranial Nerves: II: R hemianopia  III,IV, VI: EOMI without ptosis or diploplia. Pupils equal, round and reactive to light V: Facial sensation is symmetric to temperature VII: Facial movement is symmetric.  VIII: hearing is intact to voice X: Palat elevates symmetrically XI: Shoulder shrug is symmetric. XII: tongue is midline without atrophy or fasciculations.  Motor: Tone is normal. Bulk is normal. 5/5 strength was present in all four extremities.  No Drift or aterixis Sensory: Sensation is symmetric to light touch and temperature in the arms and legs. DSS Deep Tendon Reflexes: 2+ and symmetric in the biceps and patellae.  Plantars: Toes are downgoing bilaterally.  Cerebellar: FNF and HKS are intact bilaterally  NIHSS 2  Labs I have reviewed labs in epic and the results pertinent to this consultation are:  CBC    Component Value Date/Time   WBC 9.9 02/14/2020 1143   RBC 6.00 (H) 02/14/2020 1143   HGB 17.3 (H) 02/14/2020 1150   HGB 15.3 08/29/2019 0857   HCT 51.0 02/14/2020 1150   HCT 44.7  08/29/2019 0857   PLT 209 02/14/2020 1143   PLT 262 08/29/2019 0857   MCV 86.2 02/14/2020 1143   MCV 85 08/29/2019 0857   MCH 28.2 02/14/2020 1143   MCHC 32.7 02/14/2020 1143   RDW 14.5 02/14/2020 1143   RDW 13.8 08/29/2019 0857   LYMPHSABS 2.3 02/14/2020 1143   LYMPHSABS 2.0 08/29/2019 0857   MONOABS 1.0  02/14/2020 1143   EOSABS 0.3 02/14/2020 1143   EOSABS 0.2 08/29/2019 0857   BASOSABS 0.1 02/14/2020 1143   BASOSABS 0.1 08/29/2019 0857    CMP     Component Value Date/Time   NA 140 02/14/2020 1150   NA 139 08/29/2019 0857   K 4.0 02/14/2020 1150   CL 107 02/14/2020 1150   CO2 22 02/14/2020 1143   GLUCOSE 181 (H) 02/14/2020 1150   BUN 22 (H) 02/14/2020 1150   BUN 19 08/29/2019 0857   CREATININE 1.10 02/14/2020 1150   CALCIUM 9.6 02/14/2020 1143   PROT 7.0 02/14/2020 1143   PROT 6.8 08/29/2019 0857   ALBUMIN 4.1 02/14/2020 1143   ALBUMIN 4.6 08/29/2019 0857   AST 17 02/14/2020 1143   ALT 20 02/14/2020 1143   ALKPHOS 45 02/14/2020 1143   BILITOT 0.8 02/14/2020 1143   BILITOT 0.5 08/29/2019 0857   GFRNONAA >60 02/14/2020 1143   GFRAA >60 02/14/2020 1143    Lipid Panel     Component Value Date/Time   CHOL 102 08/29/2019 0857   TRIG 298 (H) 08/29/2019 0857   HDL 30 (L) 08/29/2019 0857   CHOLHDL 3.4 08/29/2019 0857   LDLCALC 28 08/29/2019 0857   LDLDIRECT 76 02/19/2014 1635     Imaging I have reviewed the images obtained:  CT-scan of the brain 1. Question hyperdense right MCA. This is not definite. 2. No CT sign of acute parenchymal infarction elsewhere in the cerebral hemispheres. Old small vessel infarction in the right cerebellum. Old appearing infarction in the right occipital lobe. 3. ASPECTS is 10. 4. Left maxillary and ethmoid sinus opacification. Possible odontogenic related to dental and periodontal disease.  CTA H/N and CTP head 1. 10 CC region of completed infarction in the left occipital cortex. Additional 11 cc of at risk adjacent  penumbra. 2. No large or medium vessel occlusion visible. Patient does have atherosclerotic irregularity of the more distal PCA branches. 3. Atherosclerotic disease at the left carotid bifurcation but without stenosis. 4. Aortic atherosclerosis.  MRI examination of the brain pending   Assessment:  [name] [name] is a 59 y.o. male pertinent risk factor in PHx  Morbid obesity, GERD, HTN, HLD, tobacco use (1/2 ppd), DM2 with neuropathy, RLS, chronic venous stasis ulcers in BLE presents with R sided hemianopia that has been present since 0200 following an episode of severe headache.   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  - not tpa candidate d/t being outside window or IR candidate d/t neg LVO   Recommendations: - admit for stroke work up  - lipid panel, a1c, echo, frequent neuro checks - MRI ordered/pending - PT/OT/ST consults - risk factor modification  Floreen Comber PA-C [facility] [phone]  Discussed with attending neurologist review of note to follow from Dr. Amada Jupiter    I have seen the patient, reviewed the note, and jointly formulated the plan listed above.  He has a PCA infarct, but no PCA occlusion, which I think would be most likely a recanalized embolus.  MRI could be further helpful to assess this.  Stroke team to follow.  Ritta Slot, MD [facility] [phone]  If 7pm- 7am, please page neurology on call as listed in AMION.

## 2020-02-14 NOTE — H&P (Signed)
History and Physical    [name] [name] WUJ:[id number] DOB: 08-06-1960 DOA: 02/14/2020  PCP: Hannah Beat, MD  Patient coming from: home I have personally briefly reviewed patient's old medical records in [facility]  Chief Complaint: Severe headache and right-sided vision loss.  HPI: [name] [name] is a 59 y.o. male with medical history significant of hypertension, hyperlipidemia, type 2 diabetes mellitus, restless leg syndrome, diabetic neuropathy, morbid obesity, tobacco abuse, depression/anxiety, GERD, chronic venous stasis ulcer in bilateral lower extremities presents to emergency department with headache and right thigh vision loss started 2 AM.  Patient tells me that he woke up at 2 AM with severe headache and had problem with the vision from right eye.  He went to ophthalmologist and was recommended to go to ER for further evaluation and management for possible stroke.  Patient denies lightheadedness, dizziness, slurred speech, seizures, loss of consciousness, head trauma, facial drop, chest pain, shortness of breath, palpitation, nausea, vomiting, cough, congestion, fever, chills, abdominal pain, urinary or bowel changes.  He smokes half pack of cigarettes per day, denies alcohol, illicit drug use.  He is compliant with his home medication and does not use walker or cane for ambulation.  Has chronic bilateral lower extremity ulcer-denies drainage, bleeding, fever or chills.  He tried to quit smoking in the past but failed.   ED Course: Upon arrival to ED: Patient blood pressure noted to be elevated, other vital signs within normal limit.  Maintaining oxygen saturation on room air.  Initial labs including CBC, CMP, PT/INR, APTT, ethanol: WNL, UA, UDS, COVID-19: Pending.  CT head shows question Hyperdense right MCA.  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  Additional 11 cc of at risk at bedtime and penumbra.   Neurology consulted.  Not a tPa candidate.  [facility] hospital[facility] consulted for admission for stroke work-up.  Review of Systems: As per HPI otherwise negative.    Past Medical History:  Diagnosis Date  . Angio-edema   . Anxiety   . Depression   . Diabetes mellitus type II    type 2  . GERD (gastroesophageal reflux disease)   . Headache    migraine  . History of kidney stones   . HLD (hyperlipidemia)   . HTN (hypertension)   . Morbid obesity (HCC)   . Restless legs syndrome (RLS) 11/20/2012  . Tobacco abuse   . Ulcer of left ankle (HCC)    last 2 months dime size dry dressing changing q day  . Urticaria     Past Surgical History:  Procedure Laterality Date  . Arm surgery Right 1969   Abscess excision and debridement at elbow  . COLONOSCOPY WITH PROPOFOL N/A 09/06/2016   Procedure: COLONOSCOPY WITH PROPOFOL;  Surgeon: Sherrilyn Rist, MD;  Location: [facility];  Service: Gastroenterology;  Laterality: N/A;  . I & D EXTREMITY Left 05/29/2014   Procedure: IRRIGATION AND DEBRIDEMENT OF LEFT LEG WOUND AND PLACEMENT OF  INTEGRA  AND  VAC;  Surgeon: Wayland Denis, DO;  Location: [facility];  Service: Plastics;  Laterality: Left;  . INCISION AND DRAINAGE OF WOUND Left 04/23/2014   Procedure: IRRIGATION AND DEBRIDEMENT LOWER LEFT LEG WOUND WITH PLACEMENT OF INTEGRA AND VAC;  Surgeon: Wayland Denis, DO;  Location: [facility];  Service: Plastics;  Laterality: Left;  . kidney stone removal  2004  . MINOR APPLICATION OF WOUND VAC Left 04/23/2014   Procedure: MINOR  APPLICATION OF WOUND VAC;  Surgeon: Wayland Denislaire Sanger, DO;  Location: [facility];  Service: Plastics;  Laterality: Left;  . SKIN SPLIT GRAFT Left 06/18/2014   Procedure: SKIN GRAFT SPLIT THICKNESS TO LOWER LEFT LEG WOUND WITH PLACEMENT OF VAC;  Surgeon: Wayland Denislaire Sanger, DO;  Location: [facility];  Service: Plastics;  Laterality: Left;  Marland Kitchen. VASECTOMY  1994     reports that he  has been smoking cigarettes. He has a 10.00 pack-year smoking history. He has never used smokeless tobacco. He reports current alcohol use. He reports that he does not use drugs.  Allergies  Allergen Reactions  . Cashew Nut Oil Anaphylaxis    Cashews, 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  Marland Kitchen. Pistachio Nut (Diagnostic) Hives, Itching, Rash, Shortness Of Breath and Swelling  . Betadine [Povidone Iodine]     Raised rash 04/23/14  . Latex Dermatitis  . Penicillins     REACTION: rash all over as child  . Lidocaine Other (See Comments)    Minimal effect with lidocaine, prefers different anesthetic    Family History  Problem Relation Age of Onset  . Stroke Mother        multiple  . Leukemia Mother   . Pneumonia Mother   . Cancer Mother   . Diabetes Mother   . Diabetes Father   . Heart disease Father   . Hyperlipidemia Father   . Hypertension Father   . Allergic rhinitis Neg Hx   . Angioedema Neg Hx   . Asthma Neg Hx   . Eczema Neg Hx   . Immunodeficiency Neg Hx   . Urticaria Neg Hx     Prior to Admission medications   Medication Sig Start Date End Date Taking? Authorizing Provider  bromocriptine (PARLODEL) 2.5 MG tablet Take 0.5 tablets (1.25 mg total) by mouth at bedtime. 09/13/19   Romero BellingEllison, Sean, MD  [facility](WELCHOL) 625 MG tablet Take 2 tablets (1,250 mg total) by mouth daily. 09/13/19   Romero BellingEllison, Sean, MD  dapagliflozin propanediol (FARXIGA) 10 MG TABS tablet Take 10 mg by mouth daily before breakfast. 03/15/19   Romero BellingEllison, Sean, MD  Dulaglutide (TRULICITY) 4.5 MG/0.5ML SOPN Inject 4.5 mg into the skin once a week. 07/01/19   Romero BellingEllison, Sean, MD  EPINEPHrine 0.3 mg/0.3 mL IJ SOAJ injection INJECT 0.3 ML INTO THE  MUSCLE ONCE FOR 1 DOSE. 10/14/19   Copland, Karleen HampshireSpencer, MD  gabapentin (NEURONTIN) 300 MG capsule TAKE 3 CAPSULES BY MOUTH 3  TIMES DAILY 07/20/19   Copland, Karleen HampshireSpencer, MD  glipiZIDE (GLUCOTROL XL) 10 MG 24 hr tablet TAKE 2 TABLETS BY MOUTH  DAILY WITH BREAKFAST 01/14/20   Copland, Karleen HampshireSpencer, MD   glucose blood (CONTOUR NEXT TEST) test strip Use to check blood sugar two times a day. Patient taking differently: 1 each by Other route 2 (two) times daily. E11.9 07/08/19   Copland, Karleen HampshireSpencer, MD  Lancets Micro Thin 33G MISC Use to check blood sugar two times daily. Patient taking differently: 1 each by Other route 2 (two) times daily. E11.9 06/17/19   Copland, Karleen HampshireSpencer, MD  lisinopril-hydrochlorothiazide (ZESTORETIC) 20-12.5 MG tablet Take 1 tablet by mouth daily. 09/06/19   Copland, Karleen HampshireSpencer, MD  meloxicam (MOBIC) 15 MG tablet TAKE 1 TABLET BY MOUTH IN  THE MORNING 07/20/19   Copland, Karleen HampshireSpencer, MD  metFORMIN (GLUCOPHAGE-XR) 500 MG 24 hr tablet TAKE 4 TABLETS BY MOUTH  DAILY WITH BREAKFAST 07/20/19   Copland, Karleen HampshireSpencer, MD  Multiple Vitamin (MULTIVITAMIN) tablet Take 1 tablet by mouth daily.  [provider]  pantoprazole (PROTONIX) 40 MG tablet TAKE 1 TABLET BY MOUTH  DAILY 07/20/19   Copland, Karleen Hampshire, MD  PARoxetine (PAXIL) 30 MG tablet TAKE 1 TABLET BY MOUTH IN  THE MORNING 07/20/19   Copland, Karleen Hampshire, MD  pentoxifylline (TRENTAL) 400 MG CR tablet TAKE 1 TABLET BY MOUTH 3  TIMES DAILY WITH MEALS 07/20/19   Copland, Karleen Hampshire, MD  rOPINIRole (REQUIP) 0.5 MG tablet TAKE 1 TABLET BY MOUTH  TWICE DAILY 07/20/19   Copland, Karleen Hampshire, MD  rosuvastatin (CRESTOR) 40 MG tablet TAKE 1 TABLET BY MOUTH  DAILY 07/20/19   Copland, Karleen Hampshire, MD  sildenafil (VIAGRA) 100 MG tablet Take 1 tablet (100 mg total) by mouth daily as needed for erectile dysfunction. 09/05/19   Copland, Karleen Hampshire, MD  varenicline (CHANTIX) 1 MG tablet Take 1 tablet (1 mg total) by mouth 2 (two) times daily. 03/28/19   Copland, Karleen Hampshire, MD  vitamin C (ASCORBIC ACID) 500 MG tablet Take 500 mg by mouth daily.    [provider]    Physical Exam: Vitals:   02/14/20 1138 02/14/20 1139 02/14/20 1232  BP: (!) 198/96  (!) 142/96  Pulse: 98  93  Resp: 16  (!) 22  Temp: 98.4 F (36.9 C)  98 F (36.7 C)  TempSrc: Oral  Oral  SpO2: 100%   95%  Weight:  104.3 kg   Height:  5\' 10"  (1.778 m)     Constitutional: NAD, calm, comfortable, on room air, obese Eyes: PERRL, lids and conjunctivae normal ENMT: Mucous membranes are moist. Posterior pharynx clear of any exudate or lesions.Normal dentition.  Neck: normal, supple, no masses, no thyromegaly Respiratory: clear to auscultation bilaterally, no wheezing, no crackles. Normal respiratory effort. No accessory muscle use.  Cardiovascular: Regular rate and rhythm, no murmurs / rubs / gallops. No extremity edema. 2+ pedal pulses. No carotid bruits.  Abdomen: no tenderness, no masses palpated. No hepatosplenomegaly. Bowel sounds positive.  Musculoskeletal:    Neurologic: Right eye-loss of peripheral vision. Sensation intact, DTR normal. Strength 5/5 in all 4.  Psychiatric: Normal judgment and insight. Alert and oriented x 3. Normal mood.    Labs on Admission: I have personally reviewed following labs and imaging studies  CBC: Recent Labs  Lab 02/14/20 1143 02/14/20 1150  WBC 9.9  --   NEUTROABS 6.2  --   HGB 16.9 17.3*  HCT 51.7 51.0  MCV 86.2  --   PLT 209  --    Basic Metabolic Panel: Recent Labs  Lab 02/14/20 1143 02/14/20 1150  NA 139 140  K 4.1 4.0  CL 103 107  CO2 22  --   GLUCOSE 183* 181*  BUN 17 22*  CREATININE 1.27* 1.10  CALCIUM 9.6  --    GFR: Estimated Creatinine Clearance: 88.5 mL/min (by C-G formula based on SCr of 1.1 mg/dL). Liver Function Tests: Recent Labs  Lab 02/14/20 1143  AST 17  ALT 20  ALKPHOS 45  BILITOT 0.8  PROT 7.0  ALBUMIN 4.1   No results for input(s): LIPASE, AMYLASE in the last 168 hours. No results for input(s): AMMONIA in the last 168 hours. Coagulation Profile: Recent Labs  Lab 02/14/20 1143  INR 0.9   Cardiac Enzymes: No results for input(s): CKTOTAL, CKMB, CKMBINDEX, TROPONINI in the last 168 hours. BNP (last 3 results) No results for input(s): PROBNP in the last 8760 hours. HbA1C: No results for  input(s): HGBA1C in the last 72 hours. CBG: No results for input(s): GLUCAP in the  last 168 hours. Lipid Profile: No results for input(s): CHOL, HDL, LDLCALC, TRIG, CHOLHDL, LDLDIRECT in the last 72 hours. Thyroid Function Tests: No results for input(s): TSH, T4TOTAL, FREET4, T3FREE, THYROIDAB in the last 72 hours. Anemia Panel: No results for input(s): VITAMINB12, FOLATE, FERRITIN, TIBC, IRON, RETICCTPCT in the last 72 hours. Urine analysis: No results found for: COLORURINE, APPEARANCEUR, LABSPEC, PHURINE, GLUCOSEU, HGBUR, BILIRUBINUR, KETONESUR, PROTEINUR, UROBILINOGEN, NITRITE, LEUKOCYTESUR  Radiological Exams on Admission: CT Angio Head W or Wo Contrast  Result Date: 02/14/2020 CLINICAL DATA:  Visual disturbance.  Acute stroke suspected. EXAM: CT ANGIOGRAPHY HEAD AND NECK CT PERFUSION BRAIN TECHNIQUE: Multidetector CT imaging of the head and neck was performed using the standard protocol during bolus administration of intravenous contrast. Multiplanar CT image reconstructions and MIPs were obtained to evaluate the vascular anatomy. Carotid stenosis measurements (when applicable) are obtained utilizing NASCET criteria, using the distal internal carotid diameter as the denominator. Multiphase CT imaging of the brain was performed following IV bolus contrast injection. Subsequent parametric perfusion maps were calculated using RAPID software. CONTRAST:  81mL OMNIPAQUE IOHEXOL 350 MG/ML SOLN COMPARISON:  Head CT earlier same day FINDINGS: CTA NECK FINDINGS Aortic arch: Aortic atherosclerosis.  Branching pattern is normal. Right carotid system: Common carotid artery widely patent to the bifurcation. Carotid bifurcation is normal without soft or calcified plaque. No stenosis. Left carotid system: Common carotid artery widely patent to the bifurcation. Soft and calcified plaque at the carotid bifurcation. No stenosis compared to the diameter of the more distal cervical ICA. Vertebral arteries: Both  vertebral artery origins are widely patent. Both vertebral arteries appear normal through the cervical region to the foramen magnum. Skeleton: Ordinary cervical spondylosis. Other neck: No mass or lymphadenopathy. Upper chest: Mild scarring at the lung apices. Review of the MIP images confirms the above findings CTA HEAD FINDINGS Anterior circulation: Both internal carotid arteries widely patent through the skull base and siphon regions. Mild atherosclerotic calcification of the carotid siphons but no stenosis. The anterior and middle cerebral vessels are patent. No large or medium vessel occlusion. No correctable proximal stenosis. Posterior circulation: Both vertebral arteries widely patent to the basilar. No basilar stenosis. Posterior circulation branch vessels are patent. Some distal atherosclerotic irregularity in the PCA branches. Venous sinuses: Patent and normal. Anatomic variants: None significant. Review of the MIP images confirms the above findings CT Brain Perfusion Findings: ASPECTS: 9 CBF (<30%) Volume: 35mL Perfusion (Tmax>6.0s) volume: 4mL Mismatch Volume: 47mL Infarction Location:Left occipital cortex IMPRESSION: 1. 10 CC region of completed infarction in the left occipital cortex. Additional 11 cc of at risk adjacent penumbra. 2. No large or medium vessel occlusion visible. Patient does have atherosclerotic irregularity of the more distal PCA branches. 3. Atherosclerotic disease at the left carotid bifurcation but without stenosis. 4. Aortic atherosclerosis. Aortic Atherosclerosis (ICD10-I70.0). Electronically Signed   By: Paulina Fusi M.D.   On: 02/14/2020 12:47   CT Angio Neck W and/or Wo Contrast  Result Date: 02/14/2020 CLINICAL DATA:  Visual disturbance.  Acute stroke suspected. EXAM: CT ANGIOGRAPHY HEAD AND NECK CT PERFUSION BRAIN TECHNIQUE: Multidetector CT imaging of the head and neck was performed using the standard protocol during bolus administration of intravenous contrast.  Multiplanar CT image reconstructions and MIPs were obtained to evaluate the vascular anatomy. Carotid stenosis measurements (when applicable) are obtained utilizing NASCET criteria, using the distal internal carotid diameter as the denominator. Multiphase CT imaging of the brain was performed following IV bolus contrast injection. Subsequent parametric perfusion maps were calculated using  RAPID software. CONTRAST:  35mL OMNIPAQUE IOHEXOL 350 MG/ML SOLN COMPARISON:  Head CT earlier same day FINDINGS: CTA NECK FINDINGS Aortic arch: Aortic atherosclerosis.  Branching pattern is normal. Right carotid system: Common carotid artery widely patent to the bifurcation. Carotid bifurcation is normal without soft or calcified plaque. No stenosis. Left carotid system: Common carotid artery widely patent to the bifurcation. Soft and calcified plaque at the carotid bifurcation. No stenosis compared to the diameter of the more distal cervical ICA. Vertebral arteries: Both vertebral artery origins are widely patent. Both vertebral arteries appear normal through the cervical region to the foramen magnum. Skeleton: Ordinary cervical spondylosis. Other neck: No mass or lymphadenopathy. Upper chest: Mild scarring at the lung apices. Review of the MIP images confirms the above findings CTA HEAD FINDINGS Anterior circulation: Both internal carotid arteries widely patent through the skull base and siphon regions. Mild atherosclerotic calcification of the carotid siphons but no stenosis. The anterior and middle cerebral vessels are patent. No large or medium vessel occlusion. No correctable proximal stenosis. Posterior circulation: Both vertebral arteries widely patent to the basilar. No basilar stenosis. Posterior circulation branch vessels are patent. Some distal atherosclerotic irregularity in the PCA branches. Venous sinuses: Patent and normal. Anatomic variants: None significant. Review of the MIP images confirms the above findings CT  Brain Perfusion Findings: ASPECTS: 9 CBF (<30%) Volume: 28mL Perfusion (Tmax>6.0s) volume: 56mL Mismatch Volume: 61mL Infarction Location:Left occipital cortex IMPRESSION: 1. 10 CC region of completed infarction in the left occipital cortex. Additional 11 cc of at risk adjacent penumbra. 2. No large or medium vessel occlusion visible. Patient does have atherosclerotic irregularity of the more distal PCA branches. 3. Atherosclerotic disease at the left carotid bifurcation but without stenosis. 4. Aortic atherosclerosis. Aortic Atherosclerosis (ICD10-I70.0). Electronically Signed   By: Paulina Fusi M.D.   On: 02/14/2020 12:47   CT Code Stroke Cerebral Perfusion with contrast  Result Date: 02/14/2020 CLINICAL DATA:  Visual disturbance.  Acute stroke suspected. EXAM: CT ANGIOGRAPHY HEAD AND NECK CT PERFUSION BRAIN TECHNIQUE: Multidetector CT imaging of the head and neck was performed using the standard protocol during bolus administration of intravenous contrast. Multiplanar CT image reconstructions and MIPs were obtained to evaluate the vascular anatomy. Carotid stenosis measurements (when applicable) are obtained utilizing NASCET criteria, using the distal internal carotid diameter as the denominator. Multiphase CT imaging of the brain was performed following IV bolus contrast injection. Subsequent parametric perfusion maps were calculated using RAPID software. CONTRAST:  78mL OMNIPAQUE IOHEXOL 350 MG/ML SOLN COMPARISON:  Head CT earlier same day FINDINGS: CTA NECK FINDINGS Aortic arch: Aortic atherosclerosis.  Branching pattern is normal. Right carotid system: Common carotid artery widely patent to the bifurcation. Carotid bifurcation is normal without soft or calcified plaque. No stenosis. Left carotid system: Common carotid artery widely patent to the bifurcation. Soft and calcified plaque at the carotid bifurcation. No stenosis compared to the diameter of the more distal cervical ICA. Vertebral arteries: Both  vertebral artery origins are widely patent. Both vertebral arteries appear normal through the cervical region to the foramen magnum. Skeleton: Ordinary cervical spondylosis. Other neck: No mass or lymphadenopathy. Upper chest: Mild scarring at the lung apices. Review of the MIP images confirms the above findings CTA HEAD FINDINGS Anterior circulation: Both internal carotid arteries widely patent through the skull base and siphon regions. Mild atherosclerotic calcification of the carotid siphons but no stenosis. The anterior and middle cerebral vessels are patent. No large or medium vessel occlusion. No correctable proximal stenosis. Posterior  circulation: Both vertebral arteries widely patent to the basilar. No basilar stenosis. Posterior circulation branch vessels are patent. Some distal atherosclerotic irregularity in the PCA branches. Venous sinuses: Patent and normal. Anatomic variants: None significant. Review of the MIP images confirms the above findings CT Brain Perfusion Findings: ASPECTS: 9 CBF (<30%) Volume: 10mL Perfusion (Tmax>6.0s) volume: 21mL Mismatch Volume: 11mL Infarction Location:Left occipital cortex IMPRESSION: 1. 10 CC region of completed infarction in the left occipital cortex. Additional 11 cc of at risk adjacent penumbra. 2. No large or medium vessel occlusion visible. Patient does have atherosclerotic irregularity of the more distal PCA branches. 3. Atherosclerotic disease at the left carotid bifurcation but without stenosis. 4. Aortic atherosclerosis. Aortic Atherosclerosis (ICD10-I70.0). Electronically Signed   By: Paulina Fusi M.D.   On: 02/14/2020 12:47   CT HEAD CODE STROKE WO CONTRAST  Result Date: 02/14/2020 CLINICAL DATA:  Code stroke.  Acute left-sided weakness. EXAM: CT HEAD WITHOUT CONTRAST TECHNIQUE: Contiguous axial images were obtained from the base of the skull through the vertex without intravenous contrast. COMPARISON:  None. FINDINGS: Brain: Generalized atrophy. No  focal finding affects the brainstem. Old small vessel infarction in the right cerebellum. Old appearing infarction in the right occipital lobe. No CT sign of acute parenchymal infarction elsewhere in the cerebral hemispheres. See below. No hydrocephalus or extra-axial collection. Vascular: Question hyperdense right MCA.  This is not definite. Skull: Normal Sinuses/Orbits: Left maxillary and ethmoid sinus opacification. Possible odontogenic related to dental and periodontal disease. Other: None ASPECTS (Alberta Stroke Program Early CT Score) - Ganglionic level infarction (caudate, lentiform nuclei, internal capsule, insula, M1-M3 cortex): 7 - Supraganglionic infarction (M4-M6 cortex): 3 Total score (0-10 with 10 being normal): 10 IMPRESSION: 1. Question hyperdense right MCA. This is not definite. 2. No CT sign of acute parenchymal infarction elsewhere in the cerebral hemispheres. Old small vessel infarction in the right cerebellum. Old appearing infarction in the right occipital lobe. 3. ASPECTS is 10. 4. Left maxillary and ethmoid sinus opacification. Possible odontogenic related to dental and periodontal disease. 5. These results were communicated to Dr. Amada Jupiter at 12:13 pmon 9/17/2021by text page via the [facility] messaging system. Electronically Signed   By: Paulina Fusi M.D.   On: 02/14/2020 12:15    EKG: Independently reviewed.  Sinus rhythm, right bundle branch block.  No ST elevation or depression noted.  Assessment/Plan Principal Problem:   Ischemic stroke (HCC) Active Problems:   Hyperlipidemia LDL goal <70   Morbid obesity (HCC)   TOBACCO ABUSE   Essential hypertension   Generalized anxiety disorder   Diabetes mellitus, type II (HCC)   Restless legs syndrome (RLS)   Diabetic polyneuropathy associated with type 2 diabetes mellitus (HCC)   Depression   GERD (gastroesophageal reflux disease)    Ischemic stroke: -Patient presented with headache and right-sided vision loss.  Reviewed CT  head, CTA head and neck and CT perfusion study. -admit forTelemetry monitoring -Allow for permissive hypertension for the first 24-48h - only treat PRN if SBP >220 mmHg or diastolic blood pressure >120. Blood pressures can be gradually normalized to SBP<140 upon discharge. -Ordered MRI brain and transthoracic echo -Start on aspirin and continue atorvastatin -Check lipid panel and A1c -Frequent neuro checks -Risk factor modification -Consult Neurology -PT/OT eval, Speech consult  Hypertension: Blood pressure elevated upon arrival -Hold home BP meds lisinopril-HCTZ to allow permissive hypertension.  Monitor blood pressure closely.  Type 2 diabetes mellitus: Check A1c -Hold Metformin, glipizide, Trulicity, Farxiga. -started patient on sliding scale insulin.  Diabetic neuropathy: Continue gabapentin  Hyperlipidemia: Check lipid panel -Continue statin, welchol  GERD: Continue PPI  Tobacco abuse: Counseled about cessation  Morbid obesity: -Diet modification/exercise and weight loss recommended  Depression/anxiety: Continue Paxil  Chronic venous stasis/diabetic ulcer in bilateral lower extremities: At baseline. -Leg elevation. -We will consult wound care.  Restless leg syndrome: Continue Requip  DVT prophylaxis: lovenox/SCD Code Status: full code Family Communication: None present at bedside.  Plan of care discussed with patient in length and he verbalized understanding and agreed with it. Disposition Plan: home in 2-3 days Consults called: neurology by EDP  Admission status: Inpatient   Ollen Bowl MD [facility]  If 7PM-7AM, please contact night-coverage [email or web address] Password [email or web address]  02/14/2020, 1:01 PM

## 2020-02-14 NOTE — ED Notes (Signed)
Attending at bedside.

## 2020-02-14 NOTE — ED Notes (Signed)
This nurse spoke with pt wife/visitor; who has remained at beside- pt wife reports pt conversation has been appropriate, and apparently pt has had difficulty with this one question prior to now.

## 2020-02-14 NOTE — ED Notes (Signed)
PT to MRI

## 2020-02-15 ENCOUNTER — Encounter (HOSPITAL_COMMUNITY): Payer: Self-pay | Admitting: Internal Medicine

## 2020-02-15 ENCOUNTER — Inpatient Hospital Stay (HOSPITAL_BASED_OUTPATIENT_CLINIC_OR_DEPARTMENT_OTHER): Payer: Managed Care, Other (non HMO)

## 2020-02-15 ENCOUNTER — Inpatient Hospital Stay (HOSPITAL_COMMUNITY): Payer: Managed Care, Other (non HMO)

## 2020-02-15 DIAGNOSIS — I639 Cerebral infarction, unspecified: Secondary | ICD-10-CM

## 2020-02-15 DIAGNOSIS — F17200 Nicotine dependence, unspecified, uncomplicated: Secondary | ICD-10-CM

## 2020-02-15 DIAGNOSIS — E785 Hyperlipidemia, unspecified: Secondary | ICD-10-CM | POA: Diagnosis not present

## 2020-02-15 DIAGNOSIS — I10 Essential (primary) hypertension: Secondary | ICD-10-CM

## 2020-02-15 DIAGNOSIS — R609 Edema, unspecified: Secondary | ICD-10-CM

## 2020-02-15 LAB — LIPID PANEL
Cholesterol: 102 mg/dL (ref 0–200)
HDL: 29 mg/dL — ABNORMAL LOW (ref >40)
LDL Cholesterol: 15 mg/dL (ref 0–99)
Total CHOL/HDL Ratio: 3.5 RATIO
Triglycerides: 290 mg/dL — ABNORMAL HIGH (ref <150)
VLDL: 58 mg/dL — ABNORMAL HIGH (ref 0–40)

## 2020-02-15 LAB — CBC WITH DIFFERENTIAL/PLATELET
Abs Immature Granulocytes: 0.03 10*3/uL (ref 0.00–0.07)
Basophils Absolute: 0.1 10*3/uL (ref 0.0–0.1)
Basophils Relative: 1 %
Eosinophils Absolute: 0.2 10*3/uL (ref 0.0–0.5)
Eosinophils Relative: 2 %
HCT: 48.2 % (ref 39.0–52.0)
Hemoglobin: 16.1 g/dL (ref 13.0–17.0)
Immature Granulocytes: 0 %
Lymphocytes Relative: 21 %
Lymphs Abs: 2.3 10*3/uL (ref 0.7–4.0)
MCH: 28.3 pg (ref 26.0–34.0)
MCHC: 33.4 g/dL (ref 30.0–36.0)
MCV: 84.7 fL (ref 80.0–100.0)
Monocytes Absolute: 1.1 10*3/uL — ABNORMAL HIGH (ref 0.1–1.0)
Monocytes Relative: 10 %
Neutro Abs: 7.1 10*3/uL (ref 1.7–7.7)
Neutrophils Relative %: 66 %
Platelets: 212 10*3/uL (ref 150–400)
RBC: 5.69 MIL/uL (ref 4.22–5.81)
RDW: 14.2 % (ref 11.5–15.5)
WBC: 10.7 10*3/uL — ABNORMAL HIGH (ref 4.0–10.5)
nRBC: 0 % (ref 0.0–0.2)

## 2020-02-15 LAB — BASIC METABOLIC PANEL
Anion gap: 9 (ref 5–15)
BUN: 15 mg/dL (ref 6–20)
CO2: 26 mmol/L (ref 22–32)
Calcium: 9.8 mg/dL (ref 8.9–10.3)
Chloride: 102 mmol/L (ref 98–111)
Creatinine, Ser: 1.08 mg/dL (ref 0.61–1.24)
GFR calc Af Amer: >60 mL/min (ref >60)
GFR calc non Af Amer: >60 mL/min (ref >60)
Glucose, Bld: 168 mg/dL — ABNORMAL HIGH (ref 70–99)
Potassium: 3.9 mmol/L (ref 3.5–5.1)
Sodium: 137 mmol/L (ref 135–145)

## 2020-02-15 LAB — GLUCOSE, CAPILLARY
Glucose-Capillary: 213 mg/dL — ABNORMAL HIGH (ref 70–99)
Glucose-Capillary: 230 mg/dL — ABNORMAL HIGH (ref 70–99)
Glucose-Capillary: 212 mg/dL — ABNORMAL HIGH (ref 70–99)

## 2020-02-15 LAB — HIV ANTIBODY (ROUTINE TESTING W REFLEX): HIV Screen 4th Generation wRfx: NONREACTIVE

## 2020-02-15 LAB — HEMOGLOBIN A1C
Hgb A1c MFr Bld: 8.4 % — ABNORMAL HIGH (ref 4.8–5.6)
Mean Plasma Glucose: 194.38 mg/dL

## 2020-02-15 MED ORDER — ROSUVASTATIN CALCIUM 20 MG PO TABS
20.0000 mg | ORAL_TABLET | Freq: Every day | ORAL | 2 refills | Status: DC
Start: 2020-02-15 — End: 2020-04-26

## 2020-02-15 MED ORDER — CLOPIDOGREL BISULFATE 75 MG PO TABS: 75.0000 mg | ORAL_TABLET | Freq: Every day | ORAL | 0 refills | Status: AC

## 2020-02-15 MED ORDER — INSULIN ASPART 100 UNIT/ML ~~LOC~~ SOLN: 0.0000 [IU] | Freq: Every day | SUBCUTANEOUS | Status: DC

## 2020-02-15 MED ORDER — INFLUENZA VAC SPLIT QUAD 0.5 ML IM SUSY: 0.5000 mL | PREFILLED_SYRINGE | INTRAMUSCULAR | Status: DC

## 2020-02-15 MED ORDER — INSULIN ASPART 100 UNIT/ML ~~LOC~~ SOLN
0.0000 [IU] | Freq: Three times a day (TID) | SUBCUTANEOUS | Status: DC
  Administered 2020-02-15: 7 [IU] via SUBCUTANEOUS

## 2020-02-15 MED ORDER — CLOPIDOGREL BISULFATE 75 MG PO TABS
75.0000 mg | ORAL_TABLET | Freq: Every day | ORAL | Status: DC
  Administered 2020-02-15: 75 mg via ORAL
  Filled 2020-02-15: qty 1

## 2020-02-15 MED ORDER — ROSUVASTATIN CALCIUM 20 MG PO TABS: 20.0000 mg | ORAL_TABLET | Freq: Every day | ORAL | Status: DC

## 2020-02-15 MED ORDER — ASPIRIN 81 MG PO TBEC
81.0000 mg | DELAYED_RELEASE_TABLET | Freq: Every day | ORAL | 3 refills | Status: DC
Start: 2020-02-16 — End: 2020-09-23

## 2020-02-15 MED ORDER — ASPIRIN EC 81 MG PO TBEC: 81.0000 mg | DELAYED_RELEASE_TABLET | Freq: Every day | ORAL | Status: DC

## 2020-02-15 NOTE — Progress Notes (Signed)
VASCULAR LAB    Bilateral lower extremity venous duplex has been performed.  See CV proc for preliminary results.   Luismanuel Corman, RVT 02/15/2020, 10:29 AM

## 2020-02-15 NOTE — Plan of Care (Signed)
  Problem: Education: Goal: Knowledge of disease or condition will improve Outcome: Adequate for Discharge Goal: Knowledge of secondary prevention will improve Outcome: Adequate for Discharge Goal: Knowledge of patient specific risk factors addressed and post discharge goals established will improve Outcome: Adequate for Discharge Goal: Individualized Educational Video(s) Outcome: Adequate for Discharge   Problem: Coping: Goal: Will verbalize positive feelings about self Outcome: Adequate for Discharge Goal: Will identify appropriate support needs Outcome: Adequate for Discharge   Problem: Self-Care: Goal: Ability to participate in self-care as condition permits will improve Outcome: Adequate for Discharge Goal: Verbalization of feelings and concerns over difficulty with self-care will improve Outcome: Adequate for Discharge Goal: Ability to communicate needs accurately will improve Outcome: Adequate for Discharge

## 2020-02-15 NOTE — Progress Notes (Signed)
Patient ready for discharge to home; discharge instructions given and reviewed; Rx's sent electronically. Wife present for discharge instructions and to accompany patient home; patient discharged out via wheelchair; stroke education reviewed with teach back and stroke education materials provided.

## 2020-02-15 NOTE — Progress Notes (Signed)
STROKE TEAM PROGRESS NOTE   INTERVAL HISTORY His wife is at the bedside.  Pt sitting at the edge of bed for lunch. He still has right hemianopia and anomia. Per wife, he has occasional word finding difficulties too. DVT neg. Will need outpt loop. His A1C has improved per wife and LDL 15, will decreased crestor dose. Pt refused MRI and will do CT repeat.   OBJECTIVE Vitals:   02/14/20 1956 02/14/20 2133 02/14/20 2338 02/15/20 0349  BP: (!) 145/94 (!) 156/106 135/78 (!) 153/79  Pulse: 81 88 77 78  Resp: 20 18 18 18   Temp: 98 F (36.7 C) 98.3 F (36.8 C) 99.8 F (37.7 C) 98.6 F (37 C)  TempSrc: Oral Oral Oral Oral  SpO2: 94% 95% 95% 98%  Weight:      Height:        CBC:  Recent Labs  Lab 02/14/20 1143 02/14/20 1143 02/14/20 1150 02/15/20 0142  WBC 9.9  --   --  10.7*  NEUTROABS 6.2  --   --  7.1  HGB 16.9   < > 17.3* 16.1  HCT 51.7   < > 51.0 48.2  MCV 86.2  --   --  84.7  PLT 209  --   --  212   < > = values in this interval not displayed.    Basic Metabolic Panel:  Recent Labs  Lab 02/14/20 1143 02/14/20 1143 02/14/20 1150 02/15/20 0142  NA 139   < > 140 137  K 4.1   < > 4.0 3.9  CL 103   < > 107 102  CO2 22  --   --  26  GLUCOSE 183*   < > 181* 168*  BUN 17   < > 22* 15  CREATININE 1.27*   < > 1.10 1.08  CALCIUM 9.6  --   --  9.8   < > = values in this interval not displayed.    Lipid Panel:     Component Value Date/Time   CHOL 102 02/15/2020 0142   CHOL 102 08/29/2019 0857   TRIG 290 (H) 02/15/2020 0142   HDL 29 (L) 02/15/2020 0142   HDL 30 (L) 08/29/2019 0857   CHOLHDL 3.5 02/15/2020 0142   VLDL 58 (H) 02/15/2020 0142   LDLCALC 15 02/15/2020 0142   LDLCALC 28 08/29/2019 0857   HgbA1c:  Lab Results  Component Value Date   HGBA1C 8.4 (H) 02/15/2020   Urine Drug Screen:     Component Value Date/Time   LABOPIA NONE DETECTED 02/14/2020 1141   COCAINSCRNUR NONE DETECTED 02/14/2020 1141   LABBENZ NONE DETECTED 02/14/2020 1141   AMPHETMU NONE  DETECTED 02/14/2020 1141   THCU NONE DETECTED 02/14/2020 1141   LABBARB NONE DETECTED 02/14/2020 1141    Alcohol Level     Component Value Date/Time   ETH <10 02/14/2020 1143    IMAGING  CT HEAD CODE STROKE WO CONTRAST 02/14/2020 IMPRESSION:  1. Question hyperdense right MCA. This is not definite.  2. No CT sign of acute parenchymal infarction elsewhere in the cerebral hemispheres. Old small vessel infarction in the right cerebellum. Old appearing infarction in the right occipital lobe.  3. ASPECTS is 10.  4. Left maxillary and ethmoid sinus opacification. Possible odontogenic related to dental and periodontal disease.   CT Angio Head W or Wo Contrast  CT Angio Neck W and/or Wo Contrast CT Code Stroke Cerebral Perfusion with contrast 02/14/2020 IMPRESSION:  1. 10 CC region of completed  infarction in the left occipital cortex. Additional 11 cc of at risk adjacent penumbra.  2. No large or medium vessel occlusion visible. Patient does have atherosclerotic irregularity of the more distal PCA branches.  3. Atherosclerotic disease at the left carotid bifurcation but without stenosis.  4. Aortic atherosclerosis. Aortic Atherosclerosis (ICD10-I70.0).    ECHOCARDIOGRAM COMPLETE 02/14/2020 IMPRESSIONS   1. Left ventricular ejection fraction, by estimation, is 60 to 65%. The left ventricle has normal function. The left ventricle has no regional wall motion abnormalities. Left ventricular diastolic parameters are consistent with Grade I diastolic dysfunction (impaired relaxation).   2. Right ventricular systolic function is normal. The right ventricular size is normal.   3. The mitral valve is grossly normal. No evidence of mitral valve regurgitation. No evidence of mitral stenosis.   4. The aortic valve is tricuspid. Aortic valve regurgitation is not visualized. No aortic stenosis is present.  Conclusion(s)/Recommendation(s): No intracardiac source of embolism detected on this transthoracic  study. A transesophageal echocardiogram is recommended to exclude cardiac source of embolism if clinically indicated.   Bilateral Lower Extremity Venous Dopplers - no DVT  ECG - SR rate 98 BPM. (See cardiology reading for complete details)   PHYSICAL EXAM  Temp:  [97.9 F (36.6 C)-99.8 F (37.7 C)] 97.9 F (36.6 C) (09/18 0746) Pulse Rate:  [77-96] 79 (09/18 0746) Resp:  [18-29] 18 (09/18 0746) BP: (134-168)/(78-112) 134/85 (09/18 0746) SpO2:  [94 %-98 %] 97 % (09/18 0746)  General - Well nourished, well developed, in no apparent distress.  Ophthalmologic - fundi not visualized due to noncooperation.  Cardiovascular - Regular rhythm and rate.  Mental Status -  Level of arousal and orientation to time, place, and person were intact. Language including expression, repetition, comprehension was assessed and found intact. However, naming 2/6.  Fund of Knowledge was assessed and was intact.  Cranial Nerves II - XII - II - Visual field right hemianopia III, IV, VI - Extraocular movements intact. V - Facial sensation intact bilaterally. VII - Facial movement intact bilaterally. VIII - Hearing & vestibular intact bilaterally. X - Palate elevates symmetrically. XI - Chin turning & shoulder shrug intact bilaterally. XII - Tongue protrusion intact.  Motor Strength - The patients strength was normal in all extremities and pronator drift was absent.  Bulk was normal and fasciculations were absent.   Motor Tone - Muscle tone was assessed at the neck and appendages and was normal.  Reflexes - The patients reflexes were symmetrical in all extremities and he had no pathological reflexes.  Sensory - Light touch, temperature/pinprick were assessed and were symmetrical.    Coordination - The patient had normal movements in the hands with no ataxia or dysmetria.  Tremor was absent.  Gait and Station - deferred.   ASSESSMENT/PLAN [name] [name] is a 59 y.o. male with history of  morbid obesity, GERD, HTN, HLD, tobacco use (1/2 ppd), DM2 with neuropathy, RLS, chronic venous stasis ulcers in BLE presents with R sided vision loss following headache.  He did not receive IV t-PA.  Stroke: left posterior MCA infarct, embolic pattern, concerning for cardioembolic source.   CT Head -  No CT sign of acute parenchymal infarction elsewhere in the cerebral hemispheres. Old small vessel infarction in the right cerebellum. Old appearing infarction in the right occipital lobe. ASPECTS is 10.   CTA H&N - Atherosclerotic disease at the left carotid bifurcation but without stenosis. No large or medium vessel occlusion visible. Patient does have atherosclerotic irregularity of  the more distal PCA branches.   CTP - 10 CC region of completed infarction in the left occipital cortex. Additional 11 cc of at risk adjacent penumbra.   MRI head - pt refused  CT repeat - left posterior MCA infarct, size bigger than estimated by CTP  Bilateral Lower Extremity Venous Dopplers - no DVT  2D Echo - EF 60 - 65%. No cardiac source of emboli identified.   Will arrange for outpt loop recorder with Dr. Johney Frame. Family would like to follow up with Dr. Okey Dupre as outpt after loop.   Sars Corona Virus 2 - negative  LDL - 15  HgbA1c - 8.4  UDS - negative  VTE prophylaxis - Lovenox  No antithrombotic prior to admission, now on ASA 81 and plavix 75 DAPT for 3 weeks and then ASA alone.   Patient counseled to be compliant with his antithrombotic medications  Ongoing aggressive stroke risk factor management  Therapy recommendations:  outpt PT/OT  Disposition:  Pending  Hypertension  Home BP meds: Lisinopril ; HCTZ  Current BP meds: none   Stable  Long-term BP goal normotensive  Hyperlipidemia  Home Lipid lowering medication: crestor 40 mg daily  LDL 15, goal < 70  Current lipid lowering medication: Crestor 20 mg daily  Continue statin at discharge  Diabetes  Home diabetic meds:  [place] ; Glucotrol ; Trulicity ; metformin  Current diabetic meds: SSI   HgbA1c 8.4, goal < 7.0  PCP and endocrinology follow up for better DM control  Tobacco abuse  Current smoker  Smoking cessation counseling provided  Pt is willing to quit  Other Stroke Risk Factors  ETOH use, advised to drink no more than 1 alcoholic beverage per day.  Obesity, Body mass index is 33 kg/m., recommend weight loss, diet and exercise as appropriate   Family hx stroke (mother)   Migraines  Other Active Problems  Code status - Full code   Anaphylaxis with cashew nuts   Aortic Atherosclerosis (ICD10-I70.0)   AKI - creatinine 1.27->1.08   Mild leukocytosis - WBC's - 10.8    (Tmax 99.8)  Hospital day # 1  Neurology will sign off. Please call with questions. Pt will follow up with stroke clinic Dr. Pearlean Brownie at [facility] in about 4 weeks. Thanks for the consult.  Marvel Plan, MD PhD Stroke Neurology 02/15/2020 3:05 PM   To contact Stroke Continuity provider, please refer to [email or web address]. After hours, contact General Neurology

## 2020-02-15 NOTE — Evaluation (Signed)
Occupational Therapy Evaluation Patient Details Name: [name] [name] MRN: [id number] DOB: 1961-03-22 Today's Date: 02/15/2020    History of Present Illness [name] [name] is a 59 y.o. male with medical history significant of hypertension, hyperlipidemia, type 2 diabetes mellitus, restless leg syndrome, diabetic neuropathy, morbid obesity, tobacco abuse, depression/anxiety, GERD, chronic venous stasis ulcer in bilateral lower extremities presents to emergency department with headache and right thigh vision loss started 2 AM. 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   Clinical Impression   PTA, pt was independent and living with his wife; works at [employer]. Pt currently requiring Min guard-Min A for LB ADLs and functional mobility. Pt presenting with R visual field cut, decreased balance, and cognitive deficits impacting his safe performance of ADLs. Pt very motivated to participate in therapy and return home. Pt requiring significant time for processing, problem solving, and expressive speech. Pt would benefit from further acute OT to facilitate safe dc. Recommend dc to home with follow up at Neuro OP for further OT to optimize safety, independence with ADLs, and return to PLOF.     Follow Up Recommendations  Outpatient OT (Neuro OP OT)    Equipment Recommendations  None recommended by OT    Recommendations for Other Services PT consult;Speech consult     Precautions / Restrictions Precautions Precautions: Other (comment) Precaution Comments: low fall Restrictions Weight Bearing Restrictions: No      Mobility Bed Mobility               General bed mobility comments: Sitting at EOB upon arrival  Transfers Overall transfer level: Needs assistance Equipment used: None Transfers: Sit to/from Stand Sit to Stand: Supervision         General transfer comment: Supervision for safety as pt hesitiant about  balance.     Balance Overall balance assessment: Mild deficits observed, not formally tested                                         ADL either performed or assessed with clinical judgement   ADL Overall ADL's : Needs assistance/impaired Eating/Feeding: Set up;Sitting   Grooming: Set up;Standing;Supervision/safety   Upper Body Bathing: Supervision/ safety;Set up;Sitting   Lower Body Bathing: Min guard;Minimal assistance;Sit to/from stand   Upper Body Dressing : Supervision/safety;Set up;Sitting   Lower Body Dressing: Min guard;Minimal assistance;Sit to/from stand   Toilet Transfer: Min guard;Minimal assistance;Ambulation (simulated to transport chair) Toilet Transfer Details (indicate cue type and reason): Min Guard A for safety. occational Min A for balance and support.          Functional mobility during ADLs: Min guard;Minimal assistance General ADL Comments: Pt presenitng with decreased cognition, problem solving, vision, balance, and safety. Very motivated.      Vision Baseline Vision/History: Wears glasses Wears Glasses: At all times Patient Visual Report: Other (comment) (R visual field) Vision Assessment?: Yes Eye Alignment: Within Functional Limits Ocular Range of Motion: Within Functional Limits Alignment/Gaze Preference: Within Defined Limits Tracking/Visual Pursuits: Able to track stimulus in all quads without difficulty Convergence: Within functional limits Visual Fields: Right visual field deficit Additional Comments: Pt with right visual field cut. Not able to see object coming from R until at midline. Pt able to attend to R and visual scans to R. Demonsatrating WFL for tracking and convergence.      Perception  Praxis      Pertinent Vitals/Pain Pain Assessment: Faces Faces Pain Scale: Hurts a little bit Pain Location: Chronic LE pain Pain Descriptors / Indicators: Discomfort Pain Intervention(s): Monitored during  session;Limited activity within patient's tolerance;Repositioned     Hand Dominance Right   Extremity/Trunk Assessment Upper Extremity Assessment Upper Extremity Assessment: RUE deficits/detail RUE Deficits / Details: Prior shoulder injury. Strength WFL. ROM WRL RUE Coordination: decreased gross motor   Lower Extremity Assessment Lower Extremity Assessment: Defer to PT evaluation   Cervical / Trunk Assessment Cervical / Trunk Assessment: Other exceptions Cervical / Trunk Exceptions: Increased body habitus   Communication Communication Communication: No difficulties   Cognition Arousal/Alertness: Awake/alert Behavior During Therapy: WFL for tasks assessed/performed Overall Cognitive Status: Impaired/Different from baseline Area of Impairment: Problem solving;Memory;Awareness                     Memory: Decreased short-term memory     Awareness: Emergent Problem Solving: Slow processing;Difficulty sequencing;Requires verbal cues General Comments: During clock drawing task, pt presenting with decreased problem solving, error regonition, sequencing, and processing. Giving cues to draw a clock and set clock to "twenty passed four". Pt drawing a small circle, and then placing two clock hands without numbers. cues for putting in numbers. Placing numbers in incorrectly. Drawing larger circle and cuing pt to place numbers around circle; pt wristing 12, 6, and 3, but then becoming stuck at number 9.    General Comments  Wife, [name] [name], present throughout    Exercises     Shoulder Instructions      Home Living Family/patient expects to be discharged to:: Private residence Living Arrangements: Spouse/significant other Available Help at Discharge: Family;Available PRN/intermittently Type of Home: House Home Access: Level entry     Home Layout: One level Alternate Level Stairs-Number of Steps: Flight   Bathroom Shower/Tub: Producer, television/film/video: Standard      Home Equipment: Environmental consultant - 2 wheels;Cane - single point;Grab bars - tub/shower;Shower seat          Prior Functioning/Environment Level of Independence: Independent        Comments: Works as an Product/process development scientist for [employer] Problem List: Impaired balance (sitting and/or standing);Decreased cognition;Decreased knowledge of precautions;Impaired vision/perception      OT Treatment/Interventions: Self-care/ADL training;Therapeutic exercise;DME and/or AE instruction;Therapeutic activities;Visual/perceptual remediation/compensation;Patient/family education;Cognitive remediation/compensation    OT Goals(Current goals can be found in the care plan section) Acute Rehab OT Goals Patient Stated Goal: "I have to go home today" OT Goal Formulation: With patient Time For Goal Achievement: 02/29/20 Potential to Achieve Goals: Good  OT Frequency: Min 3X/week   Barriers to D/C:            Co-evaluation              AM-PAC OT "6 Clicks" Daily Activity     Outcome Measure Help from another person eating meals?: None Help from another person taking care of personal grooming?: A Little Help from another person toileting, which includes using toliet, bedpan, or urinal?: A Little Help from another person bathing (including washing, rinsing, drying)?: A Little Help from another person to put on and taking off regular upper body clothing?: A Little Help from another person to put on and taking off regular lower body clothing?: A Little 6 Click Score: 19   End of Session Nurse Communication: Mobility status  Activity Tolerance: Patient tolerated treatment well Patient left: in chair;with call bell/phone within  reach (with transport team)  OT Visit Diagnosis: Unsteadiness on feet (R26.81);Other abnormalities of gait and mobility (R26.89);Muscle weakness (generalized) (M62.81)                Time: 8588-5027 OT Time Calculation (min): 36 min Charges:  OT General Charges $OT Visit: 1  Visit OT Evaluation $OT Eval Moderate Complexity: 1 Mod OT Treatments $Self Care/Home Management : 8-22 mins  [name] [name] MSOT, OTR/L Acute Rehab Pager: [phone] Office: [phone]  [name] [name] 02/15/2020, 2:37 PM

## 2020-02-15 NOTE — Progress Notes (Signed)
[facility] PROGRESS NOTE   [name] LauraHerbert Coven ZOX:[id number] DOB: [birth year] DOA: 02/14/2020  PCP: Hannah Beatopland, [place], MD  Brief History/Interval Summary: 59 y.o. male with medical history significant of hypertension, hyperlipidemia, type 2 diabetes mellitus, restless leg syndrome, diabetic neuropathy, morbid obesity, tobacco abuse, depression/anxiety, GERD, chronic venous stasis ulcer in bilateral lower extremities presented to emergency department with headache and right thigh vision loss started 2 AM on the morning of admission.  He initially went to his ophthalmologist and was referred to the emergency department.  Concern was for acute stroke.  Patient was hospitalized for further management.  Reason for Visit: Acute stroke  Consultants: Neurology  Procedures: None yet  Antibiotics: Anti-infectives (From admission, onward)   None      Subjective/Interval History: Patient continues to have problems seeing out of his right eye.  Notes mainly the peripheral vision that is lost.  He is able to see me when I stand right in front of him.  Denies any blurry visions or double vision.  No headaches.  No weakness on any 1 side of the body.    Assessment/Plan:  Acute left occipital lobe stroke with vision impairment Left occipital lobe infarct was noted on the CT perfusion study.  MRI was recommended but patient is declining due to significant claustrophobia.  Patient was not a candidate for TPA since he was outside the window. Patient currently on aspirin.  Also on statin. PT OT SLP evaluation. Stroke service to see patient. HbA1c 8.4.  LDL 15.  HIV nonreactive. Echocardiogram shows normal systolic function.  No embolic source identified.  Essential hypertension Follow-up permissive hypertension.  Holding his home medication including lisinopril HCTZ  Diabetes mellitus type 2, uncontrolled with hyperglycemia Noted to be on Metformin and glipizide along with Trulicity and  Farxiga at home.  These are being held.  HbA1c is 8.4.  Continue SSI.  Change to resistant coverage.  Diabetic neuropathy Continue gabapentin  Hyperlipidemia Continue statin.  LDL is 15.  GERD Continue PPI  Tobacco abuse He has been counseled.  History of depression/anxiety Continue Paxil  Chronic venous stasis/diabetic ulcer in bilateral lower extremity Wound care.  Restless leg syndrome Continue Requip.  Obesity Estimated body mass index is 33 kg/m as calculated from the following:   Height as of this encounter: 5\' 10"  (1.778 m).   Weight as of this encounter: 104.3 kg.   DVT Prophylaxis: Lovenox Code Status: Full code Family Communication: We will discuss with his wife Disposition Plan: Hopefully return home when improved and work-up has been completed  Status is: Inpatient  Remains inpatient appropriate because:Ongoing diagnostic testing needed not appropriate for outpatient work up   Dispo: The patient is from: Home              Anticipated d/c is to: Home              Anticipated d/c date is: 1 day              Patient currently is not medically stable to d/c.     Medications:  Scheduled: . aspirin  300 mg Rectal Daily   Or  . aspirin  325 mg Oral Daily  . enoxaparin (LOVENOX) injection  40 mg Subcutaneous Q24H  . [START ON 02/16/2020] influenza vac split quadrivalent PF  0.5 mL Intramuscular Tomorrow-1000  . insulin aspart  0-15 Units Subcutaneous TID WC  . insulin aspart  0-5 Units Subcutaneous QHS  . LORazepam  1 mg Oral Once  . rosuvastatin  40 mg Oral QHS   Continuous:  WUJ:[id number] **OR** acetaminophen (TYLENOL) oral liquid 160 mg/5 mL **OR** acetaminophen   Objective:  Vital Signs  Vitals:   02/14/20 2133 02/14/20 2338 02/15/20 0349 02/15/20 0746  BP: (!) 156/106 135/78 (!) 153/79 134/85  Pulse: 88 77 78 79  Resp: Temp: 98.3 F (36.8 C) 99.8 F (37.7 C) 98.6 F (37 C) 97.9 F (36.6 C)  TempSrc: Oral Oral Oral  Oral  SpO2: 95% 95% 98% 97%  Weight:      Height:       No intake or output data in the 24 hours ending 02/15/20 0937 Filed Weights   02/14/20 1139  Weight: 104.3 kg    General appearance: Awake alert.  In no distress Resp: Clear to auscultation bilaterally.  Normal effort Cardio: S1-S2 is normal regular.  No S3-S4.  No rubs murmurs or bruit GI: Abdomen is soft.  Nontender nondistended.  Bowel sounds are present normal.  No masses organomegaly Extremities: No edema.  Full range of motion of lower extremities. Neurologic: Alert and oriented x3.  Motor strength equal bilateral upper and lower extremities.   Lab Results:  Data Reviewed: I have personally reviewed following labs and imaging studies  CBC: Recent Labs  Lab 02/14/20 1143 02/14/20 1150 02/15/20 0142  WBC 9.9  --  10.7*  NEUTROABS 6.2  --  7.1  HGB 16.9 17.3* 16.1  HCT 51.7 51.0 48.2  MCV 86.2  --  84.7  PLT 209  --  212    Basic Metabolic Panel: Recent Labs  Lab 02/14/20 1143 02/14/20 1150 02/15/20 0142  NA 139 140 137  K 4.1 4.0 3.9  CL 103 107 102  CO2 22  --  26  GLUCOSE 183* 181* 168*  BUN 17 22* 15  CREATININE 1.27* 1.10 1.08  CALCIUM 9.6  --  9.8    GFR: Estimated Creatinine Clearance: 90.2 mL/min (by C-G formula based on SCr of 1.08 mg/dL).  Liver Function Tests: Recent Labs  Lab 02/14/20 1143  AST 17  ALT 20  ALKPHOS 45  BILITOT 0.8  PROT 7.0  ALBUMIN 4.1     Coagulation Profile: Recent Labs  Lab 02/14/20 1143  INR 0.9    HbA1C: Recent Labs    02/15/20 0142  HGBA1C 8.4*    CBG: Recent Labs  Lab 02/14/20 1719 02/14/20 2228 02/15/20 0608  GLUCAP 221* 249* 213*    Lipid Profile: Recent Labs    02/15/20 0142  CHOL 102  HDL 29*  LDLCALC 15  TRIG 130*  CHOLHDL 3.5     Recent Results (from the past 240 hour(s))  SARS Coronavirus 2 by RT PCR (hospital order, performed in [facility] hospital lab) Nasopharyngeal Nasopharyngeal Swab     Status: None    Collection Time: 02/14/20 12:39 PM   Specimen: Nasopharyngeal Swab  Result Value Ref Range Status   SARS Coronavirus 2 NEGATIVE NEGATIVE Final    Comment: (NOTE) SARS-CoV-2 target nucleic acids are NOT DETECTED.  The SARS-CoV-2 RNA is generally detectable in upper and lower respiratory specimens during the acute phase of infection. The lowest concentration of SARS-CoV-2 viral copies this assay can detect is 250 copies / mL. A negative result does not preclude SARS-CoV-2 infection and should not be used as the sole basis for treatment or other patient management decisions.  A negative result may occur with improper specimen collection / handling, submission of specimen other than nasopharyngeal swab, presence of  viral mutation(s) within the areas targeted by this assay, and inadequate number of viral copies (<250 copies / mL). A negative result must be combined with clinical observations, patient history, and epidemiological information.  Fact Sheet for Patients:   BoilerBrush.com.cy  Fact Sheet for Healthcare Providers: https://pope.com/  This test is not yet approved or  cleared by the [place] FDA and has been authorized for detection and/or diagnosis of SARS-CoV-2 by FDA under an Emergency Use Authorization (EUA).  This EUA will remain in effect (meaning this test can be used) for the duration of the COVID-19 declaration under Section 564(b)(1) of the Act, 21 U.S.C. section 360bbb-3(b)(1), unless the authorization is terminated or revoked sooner.  Performed at [facility], [address] [place], [place] [address]       Radiology Studies: CT Angio Head W or Wo Contrast  Result Date: 02/14/2020 CLINICAL DATA:  Visual disturbance.  Acute stroke suspected. EXAM: CT ANGIOGRAPHY HEAD AND NECK CT PERFUSION BRAIN TECHNIQUE: Multidetector CT imaging of the head and neck was performed using the standard protocol during  bolus administration of intravenous contrast. Multiplanar CT image reconstructions and MIPs were obtained to evaluate the vascular anatomy. Carotid stenosis measurements (when applicable) are obtained utilizing NASCET criteria, using the distal internal carotid diameter as the denominator. Multiphase CT imaging of the brain was performed following IV bolus contrast injection. Subsequent parametric perfusion maps were calculated using RAPID software. CONTRAST:  73mL OMNIPAQUE IOHEXOL 350 MG/ML SOLN COMPARISON:  Head CT earlier same day FINDINGS: CTA NECK FINDINGS Aortic arch: Aortic atherosclerosis.  Branching pattern is normal. Right carotid system: Common carotid artery widely patent to the bifurcation. Carotid bifurcation is normal without soft or calcified plaque. No stenosis. Left carotid system: Common carotid artery widely patent to the bifurcation. Soft and calcified plaque at the carotid bifurcation. No stenosis compared to the diameter of the more distal cervical ICA. Vertebral arteries: Both vertebral artery origins are widely patent. Both vertebral arteries appear normal through the cervical region to the foramen magnum. Skeleton: Ordinary cervical spondylosis. Other neck: No mass or lymphadenopathy. Upper chest: Mild scarring at the lung apices. Review of the MIP images confirms the above findings CTA HEAD FINDINGS Anterior circulation: Both internal carotid arteries widely patent through the skull base and siphon regions. Mild atherosclerotic calcification of the carotid siphons but no stenosis. The anterior and middle cerebral vessels are patent. No large or medium vessel occlusion. No correctable proximal stenosis. Posterior circulation: Both vertebral arteries widely patent to the basilar. No basilar stenosis. Posterior circulation branch vessels are patent. Some distal atherosclerotic irregularity in the PCA branches. Venous sinuses: Patent and normal. Anatomic variants: None significant. Review of  the MIP images confirms the above findings CT Brain Perfusion Findings: ASPECTS: 9 CBF (<30%) Volume: 64mL Perfusion (Tmax>6.0s) volume: 27mL Mismatch Volume: 76mL Infarction Location:Left occipital cortex IMPRESSION: 1. 10 CC region of completed infarction in the left occipital cortex. Additional 11 cc of at risk adjacent penumbra. 2. No large or medium vessel occlusion visible. Patient does have atherosclerotic irregularity of the more distal PCA branches. 3. Atherosclerotic disease at the left carotid bifurcation but without stenosis. 4. Aortic atherosclerosis. Aortic Atherosclerosis (ICD10-I70.0). Electronically Signed   By: Paulina Fusi M.D.   On: 02/14/2020 12:47   CT Angio Neck W and/or Wo Contrast  Result Date: 02/14/2020 CLINICAL DATA:  Visual disturbance.  Acute stroke suspected. EXAM: CT ANGIOGRAPHY HEAD AND NECK CT PERFUSION BRAIN TECHNIQUE: Multidetector CT imaging of the head and neck  was performed using the standard protocol during bolus administration of intravenous contrast. Multiplanar CT image reconstructions and MIPs were obtained to evaluate the vascular anatomy. Carotid stenosis measurements (when applicable) are obtained utilizing NASCET criteria, using the distal internal carotid diameter as the denominator. Multiphase CT imaging of the brain was performed following IV bolus contrast injection. Subsequent parametric perfusion maps were calculated using RAPID software. CONTRAST:  47mL OMNIPAQUE IOHEXOL 350 MG/ML SOLN COMPARISON:  Head CT earlier same day FINDINGS: CTA NECK FINDINGS Aortic arch: Aortic atherosclerosis.  Branching pattern is normal. Right carotid system: Common carotid artery widely patent to the bifurcation. Carotid bifurcation is normal without soft or calcified plaque. No stenosis. Left carotid system: Common carotid artery widely patent to the bifurcation. Soft and calcified plaque at the carotid bifurcation. No stenosis compared to the diameter of the more distal  cervical ICA. Vertebral arteries: Both vertebral artery origins are widely patent. Both vertebral arteries appear normal through the cervical region to the foramen magnum. Skeleton: Ordinary cervical spondylosis. Other neck: No mass or lymphadenopathy. Upper chest: Mild scarring at the lung apices. Review of the MIP images confirms the above findings CTA HEAD FINDINGS Anterior circulation: Both internal carotid arteries widely patent through the skull base and siphon regions. Mild atherosclerotic calcification of the carotid siphons but no stenosis. The anterior and middle cerebral vessels are patent. No large or medium vessel occlusion. No correctable proximal stenosis. Posterior circulation: Both vertebral arteries widely patent to the basilar. No basilar stenosis. Posterior circulation branch vessels are patent. Some distal atherosclerotic irregularity in the PCA branches. Venous sinuses: Patent and normal. Anatomic variants: None significant. Review of the MIP images confirms the above findings CT Brain Perfusion Findings: ASPECTS: 9 CBF (<30%) Volume: 25mL Perfusion (Tmax>6.0s) volume: 75mL Mismatch Volume: 41mL Infarction Location:Left occipital cortex IMPRESSION: 1. 10 CC region of completed infarction in the left occipital cortex. Additional 11 cc of at risk adjacent penumbra. 2. No large or medium vessel occlusion visible. Patient does have atherosclerotic irregularity of the more distal PCA branches. 3. Atherosclerotic disease at the left carotid bifurcation but without stenosis. 4. Aortic atherosclerosis. Aortic Atherosclerosis (ICD10-I70.0). Electronically Signed   By: Paulina Fusi M.D.   On: 02/14/2020 12:47   CT Code Stroke Cerebral Perfusion with contrast  Result Date: 02/14/2020 CLINICAL DATA:  Visual disturbance.  Acute stroke suspected. EXAM: CT ANGIOGRAPHY HEAD AND NECK CT PERFUSION BRAIN TECHNIQUE: Multidetector CT imaging of the head and neck was performed using the standard protocol during  bolus administration of intravenous contrast. Multiplanar CT image reconstructions and MIPs were obtained to evaluate the vascular anatomy. Carotid stenosis measurements (when applicable) are obtained utilizing NASCET criteria, using the distal internal carotid diameter as the denominator. Multiphase CT imaging of the brain was performed following IV bolus contrast injection. Subsequent parametric perfusion maps were calculated using RAPID software. CONTRAST:  70mL OMNIPAQUE IOHEXOL 350 MG/ML SOLN COMPARISON:  Head CT earlier same day FINDINGS: CTA NECK FINDINGS Aortic arch: Aortic atherosclerosis.  Branching pattern is normal. Right carotid system: Common carotid artery widely patent to the bifurcation. Carotid bifurcation is normal without soft or calcified plaque. No stenosis. Left carotid system: Common carotid artery widely patent to the bifurcation. Soft and calcified plaque at the carotid bifurcation. No stenosis compared to the diameter of the more distal cervical ICA. Vertebral arteries: Both vertebral artery origins are widely patent. Both vertebral arteries appear normal through the cervical region to the foramen magnum. Skeleton: Ordinary cervical spondylosis. Other neck: No mass or lymphadenopathy.  Upper chest: Mild scarring at the lung apices. Review of the MIP images confirms the above findings CTA HEAD FINDINGS Anterior circulation: Both internal carotid arteries widely patent through the skull base and siphon regions. Mild atherosclerotic calcification of the carotid siphons but no stenosis. The anterior and middle cerebral vessels are patent. No large or medium vessel occlusion. No correctable proximal stenosis. Posterior circulation: Both vertebral arteries widely patent to the basilar. No basilar stenosis. Posterior circulation branch vessels are patent. Some distal atherosclerotic irregularity in the PCA branches. Venous sinuses: Patent and normal. Anatomic variants: None significant. Review of  the MIP images confirms the above findings CT Brain Perfusion Findings: ASPECTS: 9 CBF (<30%) Volume: 10mL Perfusion (Tmax>6.0s) volume: 21mL Mismatch Volume: 11mL Infarction Location:Left occipital cortex IMPRESSION: 1. 10 CC region of completed infarction in the left occipital cortex. Additional 11 cc of at risk adjacent penumbra. 2. No large or medium vessel occlusion visible. Patient does have atherosclerotic irregularity of the more distal PCA branches. 3. Atherosclerotic disease at the left carotid bifurcation but without stenosis. 4. Aortic atherosclerosis. Aortic Atherosclerosis (ICD10-I70.0). Electronically Signed   By: Paulina Fusi M.D.   On: 02/14/2020 12:47   ECHOCARDIOGRAM COMPLETE  Result Date: 02/14/2020    ECHOCARDIOGRAM REPORT   Patient Name:   ARDA KEADLE Date of Exam: 02/14/2020 Medical Rec #:  [id number]      Height:       70.0 in Accession #:    [id number]     Weight:       230.0 lb Date of Birth:  Sep 05, 1960     BSA:          2.215 m Patient Age:    58 years       BP:           142/96 mmHg Patient Gender: M              HR:           88 bpm. Exam Location:  Inpatient Procedure: 2D Echo Indications:    stroke 434.91  History:        Patient has no prior history of Echocardiogram examinations.                 Risk Factors:Diabetes, Dyslipidemia, Hypertension and Current                 Smoker.  Sonographer:    Delcie Roch Referring Phys: [id number] Kasandra Knudsen PAHWANI IMPRESSIONS  1. Left ventricular ejection fraction, by estimation, is 60 to 65%. The left ventricle has normal function. The left ventricle has no regional wall motion abnormalities. Left ventricular diastolic parameters are consistent with Grade I diastolic dysfunction (impaired relaxation).  2. Right ventricular systolic function is normal. The right ventricular size is normal.  3. The mitral valve is grossly normal. No evidence of mitral valve regurgitation. No evidence of mitral stenosis.  4. The aortic valve is tricuspid.  Aortic valve regurgitation is not visualized. No aortic stenosis is present. Conclusion(s)/Recommendation(s): No intracardiac source of embolism detected on this transthoracic study. A transesophageal echocardiogram is recommended to exclude cardiac source of embolism if clinically indicated. FINDINGS  Left Ventricle: Left ventricular ejection fraction, by estimation, is 60 to 65%. The left ventricle has normal function. The left ventricle has no regional wall motion abnormalities. The left ventricular internal cavity size was normal in size. There is  no left ventricular hypertrophy. Left ventricular diastolic parameters are consistent with Grade I diastolic dysfunction (impaired relaxation). Right  Ventricle: The right ventricular size is normal. No increase in right ventricular wall thickness. Right ventricular systolic function is normal. Left Atrium: Left atrial size was normal in size. Right Atrium: Right atrial size was normal in size. Pericardium: Trivial pericardial effusion is present. Mitral Valve: The mitral valve is grossly normal. No evidence of mitral valve regurgitation. No evidence of mitral valve stenosis. Tricuspid Valve: The tricuspid valve is grossly normal. Tricuspid valve regurgitation is trivial. No evidence of tricuspid stenosis. Aortic Valve: The aortic valve is tricuspid. Aortic valve regurgitation is not visualized. No aortic stenosis is present. Pulmonic Valve: The pulmonic valve was grossly normal. Pulmonic valve regurgitation is not visualized. No evidence of pulmonic stenosis. Aorta: The aortic root and ascending aorta are structurally normal, with no evidence of dilitation. Venous: The inferior vena cava was not well visualized. IAS/Shunts: The atrial septum is grossly normal.  LEFT VENTRICLE PLAX 2D LVIDd:         4.90 cm  Diastology LVIDs:         3.80 cm  LV e' medial:    6.53 cm/s LV PW:         1.30 cm  LV E/e' medial:  10.7 LV IVS:        0.90 cm  LV e' lateral:   6.64 cm/s LVOT  diam:     2.50 cm  LV E/e' lateral: 10.5 LV SV:         96 LV SV Index:   43 LVOT Area:     4.91 cm  RIGHT VENTRICLE RV S prime:     17.00 cm/s TAPSE (M-mode): 2.3 cm LEFT ATRIUM             Index       RIGHT ATRIUM           Index LA diam:        3.90 cm 1.76 cm/m  RA Area:     17.60 cm LA Vol (A2C):   41.9 ml 18.92 ml/m RA Volume:   48.90 ml  22.08 ml/m LA Vol (A4C):   63.5 ml 28.67 ml/m LA Biplane Vol: 53.1 ml 23.97 ml/m  AORTIC VALVE LVOT Vmax:   100.00 cm/s LVOT Vmean:  63.300 cm/s LVOT VTI:    0.196 m  AORTA Ao Root diam: 4.00 cm Ao Asc diam:  3.30 cm MITRAL VALVE MV Area (PHT): 3.42 cm     SHUNTS MV Decel Time: 222 msec     Systemic VTI:  0.20 m MV E velocity: 70.00 cm/s   Systemic Diam: 2.50 cm MV A velocity: 109.00 cm/s MV E/A ratio:  0.64 Lennie Odor MD Electronically signed by Lennie Odor MD Signature Date/Time: 02/14/2020/3:29:03 PM    Final    CT HEAD CODE STROKE WO CONTRAST  Result Date: 02/14/2020 CLINICAL DATA:  Code stroke.  Acute left-sided weakness. EXAM: CT HEAD WITHOUT CONTRAST TECHNIQUE: Contiguous axial images were obtained from the base of the skull through the vertex without intravenous contrast. COMPARISON:  None. FINDINGS: Brain: Generalized atrophy. No focal finding affects the brainstem. Old small vessel infarction in the right cerebellum. Old appearing infarction in the right occipital lobe. No CT sign of acute parenchymal infarction elsewhere in the cerebral hemispheres. See below. No hydrocephalus or extra-axial collection. Vascular: Question hyperdense right MCA.  This is not definite. Skull: Normal Sinuses/Orbits: Left maxillary and ethmoid sinus opacification. Possible odontogenic related to dental and periodontal disease. Other: None ASPECTS ([place] Stroke Program Early CT Score) - Ganglionic level  infarction (caudate, lentiform nuclei, internal capsule, insula, M1-M3 cortex): 7 - Supraganglionic infarction (M4-M6 cortex): 3 Total score (0-10 with 10 being normal):  10 IMPRESSION: 1. Question hyperdense right MCA. This is not definite. 2. No CT sign of acute parenchymal infarction elsewhere in the cerebral hemispheres. Old small vessel infarction in the right cerebellum. Old appearing infarction in the right occipital lobe. 3. ASPECTS is 10. 4. Left maxillary and ethmoid sinus opacification. Possible odontogenic related to dental and periodontal disease. 5. These results were communicated to Dr. Amada Jupiter at 12:13 pmon 9/17/2021by text page via the [facility] messaging system. Electronically Signed   By: Paulina Fusi M.D.   On: 02/14/2020 12:15       LOS: 1 day   Trayon Krantz  [facility] Pager on [email or web address]  02/15/2020, 9:37 AM

## 2020-02-15 NOTE — Evaluation (Signed)
Physical Therapy Evaluation Patient Details Name: [name] [name] MRN: [id number] DOB: Nov 09, 1960 Today's Date: 02/15/2020   History of Present Illness  [name] [name] is a 59 y.o. male with medical history significant of hypertension, hyperlipidemia, type 2 diabetes mellitus, restless leg syndrome, diabetic neuropathy, morbid obesity, tobacco abuse, depression/anxiety, GERD, chronic venous stasis ulcer in bilateral lower extremities presents to emergency department with headache and right thigh vision loss started 2 AM. 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  Clinical Impression  Patient received sitting on edge of bed. Agrees to PT assessment. Wife present. He is able to perform sit to stand with supervision only. Ambulated 125 feet with single hand held assist. Hesitant with gait, but no lob or unsteadiness noted. He will continue to benefit from skilled PT while here to improve safety with mobility. He may need OPPT for high level balance activities.      Follow Up Recommendations Outpatient PT    Equipment Recommendations  None recommended by PT    Recommendations for Other Services       Precautions / Restrictions Precautions Precaution Comments: low fall Restrictions Weight Bearing Restrictions: No      Mobility  Bed Mobility               General bed mobility comments: patient received sitting up on edge of bed  Transfers Overall transfer level: Modified independent Equipment used: None             General transfer comment: Sit to stand without physical assist  Ambulation/Gait Ambulation/Gait assistance: Min guard Gait Distance (Feet): 125 Feet Assistive device: 1 person hand held assist Gait Pattern/deviations: Step-through pattern Gait velocity: decr   General Gait Details: slightly hesitant gait. No lob  Stairs            Wheelchair Mobility    Modified Rankin (Stroke  Patients Only) Modified Rankin (Stroke Patients Only) Pre-Morbid Rankin Score: No symptoms Modified Rankin: Moderate disability     Balance Overall balance assessment: Modified Independent                                           Pertinent Vitals/Pain Pain Assessment: Faces Faces Pain Scale: Hurts a little bit Pain Location: Chronic LE pain Pain Descriptors / Indicators: Discomfort Pain Intervention(s): Monitored during session    Home Living Family/patient expects to be discharged to:: Private residence Living Arrangements: Spouse/significant other Available Help at Discharge: Family;Available PRN/intermittently Type of Home: House Home Access: Level entry     Home Layout: One level Home Equipment: Walker - 2 wheels;Cane - single point;Grab bars - tub/shower;Shower seat      Prior Function Level of Independence: Independent         Comments: (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   Dominant Hand: (P) Right    Extremity/Trunk Assessment   Upper Extremity Assessment Upper Extremity Assessment: Defer to OT evaluation RUE Deficits / Details: (P) Prior shoulder injury. Strength WFL. ROM WRL RUE Coordination: (P) decreased gross motor    Lower Extremity Assessment Lower Extremity Assessment: Overall WFL for tasks assessed    Cervical / Trunk Assessment Cervical / Trunk Assessment: Normal Cervical / Trunk Exceptions: (P) Increased body habitus  Communication   Communication: No difficulties  Cognition Arousal/Alertness: Awake/alert Behavior During Therapy: WFL for  tasks assessed/performed Overall Cognitive Status: Within Functional Limits for tasks assessed Area of Impairment: (P) Problem solving                             Problem Solving: Slow processing General Comments: requires increased time to answer questions. Low stimulus      General Comments      Exercises     Assessment/Plan    PT  Assessment Patient needs continued PT services  PT Problem List Decreased mobility;Decreased activity tolerance;Decreased knowledge of precautions       PT Treatment Interventions Therapeutic exercise;Therapeutic activities;Gait training;Functional mobility training    PT Goals (Current goals can be found in the Care Plan section)  Acute Rehab PT Goals Patient Stated Goal: to return home PT Goal Formulation: With patient Time For Goal Achievement: 02/22/20 Potential to Achieve Goals: Good    Frequency Min 3X/week   Barriers to discharge        Co-evaluation               AM-PAC PT "6 Clicks" Mobility  Outcome Measure Help needed turning from your back to your side while in a flat bed without using bedrails?: None Help needed moving from lying on your back to sitting on the side of a flat bed without using bedrails?: None Help needed moving to and from a bed to a chair (including a wheelchair)?: A Little Help needed standing up from a chair using your arms (e.g., wheelchair or bedside chair)?: None Help needed to walk in hospital room?: A Little Help needed climbing 3-5 steps with a railing? : A Little 6 Click Score: 21    End of Session   Activity Tolerance: Patient tolerated treatment well Patient left: in bed;with family/visitor present;with call bell/phone within reach;Other (comment) (sitting on side of bed) Nurse Communication: Mobility status PT Visit Diagnosis: Other abnormalities of gait and mobility (R26.89)    Time: 1300-1315 PT Time Calculation (min) (ACUTE ONLY): 15 min   Charges:   PT Evaluation $PT Eval Moderate Complexity: 1 Mod          Karsten Vaughn, PT, GCS 02/15/20,1:28 PM

## 2020-02-15 NOTE — Discharge Instructions (Signed)
Ischemic Stroke  An ischemic stroke (cerebrovascular accident, or CVA) is the sudden death of brain tissue that occurs when an area of the brain does not get enough oxygen. It is a medical emergency that must be treated right away. An ischemic stroke can cause permanent loss of brain function. This can cause problems with how different parts of your body function. What are the causes? This condition is caused by a decrease of oxygen supply to an area of the brain, which may be the result of:  A small blood clot (embolus) or a buildup of plaque in the blood vessels (atherosclerosis) that blocks blood flow in the brain.  An abnormal heart rhythm (atrial fibrillation).  A blocked or damaged artery in the head or neck. Sometimes the cause of stroke is not known (cryptogenic). What increases the risk? Certain factors may make you more likely to develop this condition. Some of these factors are things that you can change, such as:  Obesity.  Smoking cigarettes.  Taking oral birth control, especially if you also use tobacco.  Physical inactivity.  Excessive alcohol use.  Use of illegal drugs, especially cocaine and methamphetamine. Other risk factors include:  High blood pressure (hypertension).  High cholesterol.  Diabetes mellitus.  Heart disease.  Being African American, Native American, Hispanic, or [place] Native.  Being over age 60.  Family history of stroke.  Previous history of blood clots, stroke, or transient ischemic attack (TIA).  Sickle cell disease.  Being a woman with a history of preeclampsia.  Migraine headache.  Sleep apnea.  Irregular heartbeats, such as atrial fibrillation.  Chronic inflammatory diseases, such as rheumatoid arthritis or lupus.  Blood clotting disorders (hypercoagulable state). What are the signs or symptoms? Symptoms of this condition usually develop suddenly, or you may notice them after waking up from sleep. Symptoms may include  sudden:  Weakness or numbness in your face, arm, or leg, especially on one side of your body.  Trouble walking or difficulty moving your arms or legs.  Loss of balance or coordination.  Confusion.  Slurred speech (dysarthria).  Trouble speaking, understanding speech, or both (aphasia).  Vision changes--such as double vision, blurred vision, or loss of vision--in one or both eyes.  Dizziness.  Nausea and vomiting.  Severe headache with no known cause. The headache is often described as the worst headache ever experienced. If possible, make note of the exact time that you last felt like your normal self and what time your symptoms started. Tell your health care provider. If symptoms come and go, this could be a sign of a warning stroke, or TIA. Get help right away, even if you feel better. How is this diagnosed? This condition may be diagnosed based on:  Your symptoms, your medical history, and a physical exam.  CT scan of the brain.  MRI.  CT angiogram. This test uses a computer to take X-rays of your arteries. A dye may be injected into your blood to show the inside of your blood vessels more clearly.  MRI angiogram. This is a type of MRI that is used to evaluate the blood vessels.  Cerebral angiogram. This test uses X-rays and a dye to show the blood vessels in the brain and neck. You may need to see a health care provider who specializes in stroke care. A stroke specialist can be seen in person or through communication using telephone or television technology (telemedicine). Other tests may also be done to find the cause of the stroke, such   as:  Electrocardiogram (ECG).  Continuous heart monitoring.  Echocardiogram.  Transesophageal echocardiogram (TEE).  Carotid ultrasound.  A scan of the brain circulation.  Blood tests.  Sleep study to check for sleep apnea. How is this treated? Treatment for this condition will depend on the duration, severity, and cause  of your symptoms and on the area of the brain affected. It is very important to get treatment at the first sign of stroke symptoms. Some treatments work better if they are done within 3-6 hours of the onset of stroke symptoms. These initial treatments may include:  Aspirin.  Medicines to control blood pressure.  Medicine given by injection to dissolve the blood clot (thrombolytic).  Treatments given directly to the affected artery to remove or dissolve the blood clot. Other treatment options may include:  Oxygen.  IV fluids.  Medicines to thin the blood (anticoagulants or antiplatelets).  Procedures to increase blood flow. Medicines and changes to your diet may be used to help treat and manage risk factors for stroke, such as diabetes, high cholesterol, and high blood pressure. After a stroke, you may work with physical, speech, mental health, or occupational therapists to help you recover. Follow these instructions at home: Medicines  Take over-the-counter and prescription medicines only as told by your health care provider.  If you were told to take a medicine to thin your blood, such as aspirin or an anticoagulant, take it exactly as told by your health care provider. ? Taking too much blood-thinning medicine can cause bleeding. ? If you do not take enough blood-thinning medicine, you will not have the protection that you need against another stroke and other problems.  Understand the side effects of taking anticoagulant medicine. When taking this type of medicine, make sure you: ? Hold pressure over any cuts for longer than usual. ? Tell your dentist and other health care providers that you are taking anticoagulants before you have any procedures that may cause bleeding. ? Avoid activities that may cause trauma or injury. Eating and drinking  Follow instructions from your health care provider about diet.  Eat healthy foods.  If your ability to swallow was affected by the  stroke, you may need to take steps to avoid choking, such as: ? Taking small bites when eating. ? Eating foods that are soft or pureed. Safety  Follow instructions from your health care team about physical activity.  Use a walker or cane as told by your health care provider.  Take steps to create a safe home environment in order to reduce the risk of falls. This may include: ? Having your home looked at by specialists. ? Installing grab bars in the bedroom and bathroom. ? Using safety equipment, such as raised toilets and a seat in the shower. General instructions  Do not use any tobacco products, such as cigarettes, chewing tobacco, and e-cigarettes. If you need help quitting, ask your health care provider.  Limit alcohol intake to no more than 1 drink a day for nonpregnant women and 2 drinks a day for men. One drink equals 12 oz of beer, 5 oz of wine, or 1 oz of hard liquor.  If you need help to stop using drugs or alcohol, ask your health care provider about a referral to a program or specialist.  Maintain an active and healthy lifestyle. Get regular exercise as told by your health care provider.  Keep all follow-up visits as told by your health care provider, including visits with all specialists on your   health care team. This is important. How is this prevented? Your risk of another stroke can be decreased by managing high blood pressure, high cholesterol, diabetes, heart disease, sleep apnea, and obesity. It can also be decreased by quitting smoking, limiting alcohol, and staying physically active. Your health care provider will continue to work with you on measures to prevent short-term and long-term complications of stroke. Get help right away if:   You have any symptoms of a stroke. "BE FAST" is an easy way to remember the main warning signs of a stroke: ? B - Balance. Signs are dizziness, sudden trouble walking, or loss of balance. ? E - Eyes. Signs are trouble seeing or a  sudden change in vision. ? F - Face. Signs are sudden weakness or numbness of the face, or the face or eyelid drooping on one side. ? A - Arms. Signs are weakness or numbness in an arm. This happens suddenly and usually on one side of the body. ? S - Speech. Signs are sudden trouble speaking, slurred speech, or trouble understanding what people say. ? T - Time. Time to call emergency services. Write down what time symptoms started.  You have other signs of a stroke, such as: ? A sudden, severe headache with no known cause. ? Nausea or vomiting. ? Seizure.  These symptoms may represent a serious problem that is an emergency. Do not wait to see if the symptoms will go away. Get medical help right away. Call your local emergency services (911 in the [place] Do not drive yourself to the hospital. Summary  An ischemic stroke (cerebrovascular accident, or CVA) is the sudden death of brain tissue that occurs when an area of the brain does not get enough oxygen.  Symptoms of this condition usually develop suddenly, or you may notice them after waking up from sleep.  It is very important to get treatment at the first sign of stroke symptoms. Stroke is a medical emergency that must be treated right away. This information is not intended to replace advice given to you by your health care provider. Make sure you discuss any questions you have with your health care provider. Document Revised: 02/02/2018 Document Reviewed: 08/12/2015 [employer] Patient Education  2020 [employer]  

## 2020-02-15 NOTE — Discharge Summary (Signed)
[facility]  Physician Discharge Summary   Patient ID: [name] [name] MRN: [id number] DOB/AGE: 28-Feb-1961 59 y.o.  Admit date: 02/14/2020 Discharge date: 02/15/2020  PCP: Hannah Beat, MD  DISCHARGE DIAGNOSES:  Acute left occipital lobe stroke with vision impairment Essential hypertension Diabetes mellitus type 2, uncontrolled with hyperglycemia Diabetes neuropathy Hyperlipidemia GERD Tobacco abuse History of depression anxiety Chronic venous disease with diabetic ulcer versus venous stasis ulcer bilateral lower extremity Restless leg syndrome   RECOMMENDATIONS FOR OUTPATIENT FOLLOW UP: 1. Outpatient follow-up with neurology 2. Loop recorder in the outpatient setting.  Message sent to cardiology. 3. Ambulatory referral sent for outpatient PT and OT.    Home Health: Outpatient PT and OT Equipment/Devices: None  CODE STATUS: Full code  DISCHARGE CONDITION: fair  Diet recommendation: Modified carbohydrate  INITIAL HISTORY: 59 y.o.malewith medical history significant ofhypertension, hyperlipidemia, type 2 diabetes mellitus, restless leg syndrome, diabetic neuropathy, morbid obesity, tobacco abuse, depression/anxiety, GERD, chronic venous stasis ulcer in bilateral lower extremities presented to emergency department with headache and right thigh vision loss started 2 AM on the morning of admission.  He initially went to his ophthalmologist and was referred to the emergency department.  Concern was for acute stroke.  Patient was hospitalized for further management.  Consultations:  Neurology    HOSPITAL COURSE:   Acute left occipital lobe stroke with vision impairment Left occipital lobe infarct was noted on the CT perfusion study.  MRI was recommended but patient is declining due to significant claustrophobia.  Patient was not a candidate for TPA since he was outside the window. Patient seen by stroke service.  Aspirin and Plavix recommended for 3 weeks  followed by aspirin alone.  Dose of Crestor changed due to low LDL of 15. Patient seen by PT and OT.  Outpatient PT OT recommended.  Referral sent. HbA1c 8.4.  LDL 15.  HIV nonreactive. Echocardiogram shows normal systolic function.  No embolic source identified.  Essential hypertension Permissive hypertension was allowed in the hospital.  He may resume his home medications.  Diabetes mellitus type 2, uncontrolled with hyperglycemia Noted to be on Metformin and glipizide along with Trulicity and Farxiga at home.  HbA1c is 8.4.    Okay to resume  Diabetic neuropathy Continue gabapentin  Hyperlipidemia Continue statin.  LDL is 15.  GERD Continue PPI  Tobacco abuse He has been counseled.  History of depression/anxiety Continue Paxil  Chronic venous stasis versus diabetic ulcer in bilateral lower extremity Outpatient follow-up with PCP  Restless leg syndrome Continue Requip.  Obesity Estimated body mass index is 33 kg/m as calculated from the following:   Height as of this encounter: 5\' 10"  (1.778 m).   Weight as of this encounter: 104.3 kg.   Overall stable.  Cleared by neurology.  Patient very keen on going home today.  Okay for discharge home today.  Discussed with patient and his wife.  Discussed with stroke service.   PERTINENT LABS:  The results of significant diagnostics from this hospitalization (including imaging, microbiology, ancillary and laboratory) are listed below for reference.    Microbiology: Recent Results (from the past 240 hour(s))  SARS Coronavirus 2 by RT PCR (hospital order, performed in [facility] hospital lab) Nasopharyngeal Nasopharyngeal Swab     Status: None   Collection Time: 02/14/20 12:39 PM   Specimen: Nasopharyngeal Swab  Result Value Ref Range Status   SARS Coronavirus 2 NEGATIVE NEGATIVE Final    Comment: (NOTE) SARS-CoV-2 target nucleic acids are NOT DETECTED.  The SARS-CoV-2 RNA  is generally detectable in upper and  lower respiratory specimens during the acute phase of infection. The lowest concentration of SARS-CoV-2 viral copies this assay can detect is 250 copies / mL. A negative result does not preclude SARS-CoV-2 infection and should not be used as the sole basis for treatment or other patient management decisions.  A negative result may occur with improper specimen collection / handling, submission of specimen other than nasopharyngeal swab, presence of viral mutation(s) within the areas targeted by this assay, and inadequate number of viral copies (<250 copies / mL). A negative result must be combined with clinical observations, patient history, and epidemiological information.  Fact Sheet for Patients:   BoilerBrush.com.cy  Fact Sheet for Healthcare Providers: https://pope.com/  This test is not yet approved or  cleared by the [place] FDA and has been authorized for detection and/or diagnosis of SARS-CoV-2 by FDA under an Emergency Use Authorization (EUA).  This EUA will remain in effect (meaning this test can be used) for the duration of the COVID-19 declaration under Section 564(b)(1) of the Act, 21 U.S.C. section 360bbb-3(b)(1), unless the authorization is terminated or revoked sooner.  Performed at [facility], [address] [place], [place] [address]      Labs:    Basic Metabolic Panel: Recent Labs  Lab 02/14/20 1143 02/14/20 1150 02/15/20 0142  NA 139 140 137  K 4.1 4.0 3.9  CL 103 107 102  CO2 22  --  26  GLUCOSE 183* 181* 168*  BUN 17 22* 15  CREATININE 1.27* 1.10 1.08  CALCIUM 9.6  --  9.8   Liver Function Tests: Recent Labs  Lab 02/14/20 1143  AST 17  ALT 20  ALKPHOS 45  BILITOT 0.8  PROT 7.0  ALBUMIN 4.1   CBC: Recent Labs  Lab 02/14/20 1143 02/14/20 1150 02/15/20 0142  WBC 9.9  --  10.7*  NEUTROABS 6.2  --  7.1  HGB 16.9 17.3* 16.1  HCT 51.7 51.0 48.2  MCV 86.2  --  84.7  PLT  209  --  212    CBG: Recent Labs  Lab 02/14/20 1719 02/14/20 2228 02/15/20 0608 02/15/20 1242 02/15/20 1547  GLUCAP 221* 249* 213* 230* 212*     IMAGING STUDIES CT Angio Head W or Wo Contrast  Result Date: 02/14/2020 CLINICAL DATA:  Visual disturbance.  Acute stroke suspected. EXAM: CT ANGIOGRAPHY HEAD AND NECK CT PERFUSION BRAIN TECHNIQUE: Multidetector CT imaging of the head and neck was performed using the standard protocol during bolus administration of intravenous contrast. Multiplanar CT image reconstructions and MIPs were obtained to evaluate the vascular anatomy. Carotid stenosis measurements (when applicable) are obtained utilizing NASCET criteria, using the distal internal carotid diameter as the denominator. Multiphase CT imaging of the brain was performed following IV bolus contrast injection. Subsequent parametric perfusion maps were calculated using RAPID software. CONTRAST:  75mL OMNIPAQUE IOHEXOL 350 MG/ML SOLN COMPARISON:  Head CT earlier same day FINDINGS: CTA NECK FINDINGS Aortic arch: Aortic atherosclerosis.  Branching pattern is normal. Right carotid system: Common carotid artery widely patent to the bifurcation. Carotid bifurcation is normal without soft or calcified plaque. No stenosis. Left carotid system: Common carotid artery widely patent to the bifurcation. Soft and calcified plaque at the carotid bifurcation. No stenosis compared to the diameter of the more distal cervical ICA. Vertebral arteries: Both vertebral artery origins are widely patent. Both vertebral arteries appear normal through the cervical region to the foramen magnum. Skeleton: Ordinary cervical spondylosis. Other  neck: No mass or lymphadenopathy. Upper chest: Mild scarring at the lung apices. Review of the MIP images confirms the above findings CTA HEAD FINDINGS Anterior circulation: Both internal carotid arteries widely patent through the skull base and siphon regions. Mild atherosclerotic calcification  of the carotid siphons but no stenosis. The anterior and middle cerebral vessels are patent. No large or medium vessel occlusion. No correctable proximal stenosis. Posterior circulation: Both vertebral arteries widely patent to the basilar. No basilar stenosis. Posterior circulation branch vessels are patent. Some distal atherosclerotic irregularity in the PCA branches. Venous sinuses: Patent and normal. Anatomic variants: None significant. Review of the MIP images confirms the above findings CT Brain Perfusion Findings: ASPECTS: 9 CBF (<30%) Volume: 10mL Perfusion (Tmax>6.0s) volume: 21mL Mismatch Volume: 11mL Infarction Location:Left occipital cortex IMPRESSION: 1. 10 CC region of completed infarction in the left occipital cortex. Additional 11 cc of at risk adjacent penumbra. 2. No large or medium vessel occlusion visible. Patient does have atherosclerotic irregularity of the more distal PCA branches. 3. Atherosclerotic disease at the left carotid bifurcation but without stenosis. 4. Aortic atherosclerosis. Aortic Atherosclerosis (ICD10-I70.0). Electronically Signed   By: Paulina Fusi M.D.   On: 02/14/2020 12:47   CT HEAD WO CONTRAST  Result Date: 02/15/2020 CLINICAL DATA:  Stroke follow-up EXAM: CT HEAD WITHOUT CONTRAST TECHNIQUE: Contiguous axial images were obtained from the base of the skull through the vertex without intravenous contrast. COMPARISON:  CT angio head and neck and CT perfusion 02/14/2020 FINDINGS: Brain: Developing hypodensity in the left occipital parietal lobe compatible with acute infarct. This corresponds to the CT perfusion abnormality. No hemorrhagic transformation Small chronic infarcts in the right cerebellum. Small chronic infarct right occipital lobe. Ventricle size normal. Negative for hemorrhage or mass. Vascular: Negative for focal hyperdense vessel. The circle Willis is diffusely hyperdense. Skull: Negative Sinuses/Orbits: Complete opacification left maxillary sinus unchanged.  Mild mucosal edema ethmoid sinus bilaterally. Negative orbit. Other: None IMPRESSION: Acute infarct in the left occipital parietal lobe with progressive low-density since the prior studies. Negative for hemorrhagic transformation. Electronically Signed   By: Marlan Palau M.D.   On: 02/15/2020 14:05   CT Angio Neck W and/or Wo Contrast  Result Date: 02/14/2020 CLINICAL DATA:  Visual disturbance.  Acute stroke suspected. EXAM: CT ANGIOGRAPHY HEAD AND NECK CT PERFUSION BRAIN TECHNIQUE: Multidetector CT imaging of the head and neck was performed using the standard protocol during bolus administration of intravenous contrast. Multiplanar CT image reconstructions and MIPs were obtained to evaluate the vascular anatomy. Carotid stenosis measurements (when applicable) are obtained utilizing NASCET criteria, using the distal internal carotid diameter as the denominator. Multiphase CT imaging of the brain was performed following IV bolus contrast injection. Subsequent parametric perfusion maps were calculated using RAPID software. CONTRAST:  75mL OMNIPAQUE IOHEXOL 350 MG/ML SOLN COMPARISON:  Head CT earlier same day FINDINGS: CTA NECK FINDINGS Aortic arch: Aortic atherosclerosis.  Branching pattern is normal. Right carotid system: Common carotid artery widely patent to the bifurcation. Carotid bifurcation is normal without soft or calcified plaque. No stenosis. Left carotid system: Common carotid artery widely patent to the bifurcation. Soft and calcified plaque at the carotid bifurcation. No stenosis compared to the diameter of the more distal cervical ICA. Vertebral arteries: Both vertebral artery origins are widely patent. Both vertebral arteries appear normal through the cervical region to the foramen magnum. Skeleton: Ordinary cervical spondylosis. Other neck: No mass or lymphadenopathy. Upper chest: Mild scarring at the lung apices. Review of the MIP images confirms the  above findings CTA HEAD FINDINGS Anterior  circulation: Both internal carotid arteries widely patent through the skull base and siphon regions. Mild atherosclerotic calcification of the carotid siphons but no stenosis. The anterior and middle cerebral vessels are patent. No large or medium vessel occlusion. No correctable proximal stenosis. Posterior circulation: Both vertebral arteries widely patent to the basilar. No basilar stenosis. Posterior circulation branch vessels are patent. Some distal atherosclerotic irregularity in the PCA branches. Venous sinuses: Patent and normal. Anatomic variants: None significant. Review of the MIP images confirms the above findings CT Brain Perfusion Findings: ASPECTS: 9 CBF (<30%) Volume: 61mL Perfusion (Tmax>6.0s) volume: 28mL Mismatch Volume: 59mL Infarction Location:Left occipital cortex IMPRESSION: 1. 10 CC region of completed infarction in the left occipital cortex. Additional 11 cc of at risk adjacent penumbra. 2. No large or medium vessel occlusion visible. Patient does have atherosclerotic irregularity of the more distal PCA branches. 3. Atherosclerotic disease at the left carotid bifurcation but without stenosis. 4. Aortic atherosclerosis. Aortic Atherosclerosis (ICD10-I70.0). Electronically Signed   By: Paulina Fusi M.D.   On: 02/14/2020 12:47   CT Code Stroke Cerebral Perfusion with contrast  Result Date: 02/14/2020 CLINICAL DATA:  Visual disturbance.  Acute stroke suspected. EXAM: CT ANGIOGRAPHY HEAD AND NECK CT PERFUSION BRAIN TECHNIQUE: Multidetector CT imaging of the head and neck was performed using the standard protocol during bolus administration of intravenous contrast. Multiplanar CT image reconstructions and MIPs were obtained to evaluate the vascular anatomy. Carotid stenosis measurements (when applicable) are obtained utilizing NASCET criteria, using the distal internal carotid diameter as the denominator. Multiphase CT imaging of the brain was performed following IV bolus contrast injection.  Subsequent parametric perfusion maps were calculated using RAPID software. CONTRAST:  38mL OMNIPAQUE IOHEXOL 350 MG/ML SOLN COMPARISON:  Head CT earlier same day FINDINGS: CTA NECK FINDINGS Aortic arch: Aortic atherosclerosis.  Branching pattern is normal. Right carotid system: Common carotid artery widely patent to the bifurcation. Carotid bifurcation is normal without soft or calcified plaque. No stenosis. Left carotid system: Common carotid artery widely patent to the bifurcation. Soft and calcified plaque at the carotid bifurcation. No stenosis compared to the diameter of the more distal cervical ICA. Vertebral arteries: Both vertebral artery origins are widely patent. Both vertebral arteries appear normal through the cervical region to the foramen magnum. Skeleton: Ordinary cervical spondylosis. Other neck: No mass or lymphadenopathy. Upper chest: Mild scarring at the lung apices. Review of the MIP images confirms the above findings CTA HEAD FINDINGS Anterior circulation: Both internal carotid arteries widely patent through the skull base and siphon regions. Mild atherosclerotic calcification of the carotid siphons but no stenosis. The anterior and middle cerebral vessels are patent. No large or medium vessel occlusion. No correctable proximal stenosis. Posterior circulation: Both vertebral arteries widely patent to the basilar. No basilar stenosis. Posterior circulation branch vessels are patent. Some distal atherosclerotic irregularity in the PCA branches. Venous sinuses: Patent and normal. Anatomic variants: None significant. Review of the MIP images confirms the above findings CT Brain Perfusion Findings: ASPECTS: 9 CBF (<30%) Volume: 90mL Perfusion (Tmax>6.0s) volume: 27mL Mismatch Volume: 67mL Infarction Location:Left occipital cortex IMPRESSION: 1. 10 CC region of completed infarction in the left occipital cortex. Additional 11 cc of at risk adjacent penumbra. 2. No large or medium vessel occlusion  visible. Patient does have atherosclerotic irregularity of the more distal PCA branches. 3. Atherosclerotic disease at the left carotid bifurcation but without stenosis. 4. Aortic atherosclerosis. Aortic Atherosclerosis (ICD10-I70.0). Electronically Signed   By: Loraine Leriche  Shogry M.D.   On: 02/14/2020 12:47   ECHOCARDIOGRAM COMPLETE  Result Date: 02/14/2020    ECHOCARDIOGRAM REPORT   Patient Name:   [name] [name] Date of Exam: 02/14/2020 Medical Rec #:  [id number]      Height:       70.0 in Accession #:    [id number]     Weight:       230.0 lb Date of Birth:  29-Apr-1961     BSA:          2.215 m Patient Age:    58 years       BP:           142/96 mmHg Patient Gender: M              HR:           88 bpm. Exam Location:  Inpatient Procedure: 2D Echo Indications:    stroke 434.91  History:        Patient has no prior history of Echocardiogram examinations.                 Risk Factors:Diabetes, Dyslipidemia, Hypertension and Current                 Smoker.  Sonographer:    Delcie Roch Referring Phys: [id number] Kasandra Knudsen PAHWANI IMPRESSIONS  1. Left ventricular ejection fraction, by estimation, is 60 to 65%. The left ventricle has normal function. The left ventricle has no regional wall motion abnormalities. Left ventricular diastolic parameters are consistent with Grade I diastolic dysfunction (impaired relaxation).  2. Right ventricular systolic function is normal. The right ventricular size is normal.  3. The mitral valve is grossly normal. No evidence of mitral valve regurgitation. No evidence of mitral stenosis.  4. The aortic valve is tricuspid. Aortic valve regurgitation is not visualized. No aortic stenosis is present. Conclusion(s)/Recommendation(s): No intracardiac source of embolism detected on this transthoracic study. A transesophageal echocardiogram is recommended to exclude cardiac source of embolism if clinically indicated. FINDINGS  Left Ventricle: Left ventricular ejection fraction, by estimation, is  60 to 65%. The left ventricle has normal function. The left ventricle has no regional wall motion abnormalities. The left ventricular internal cavity size was normal in size. There is  no left ventricular hypertrophy. Left ventricular diastolic parameters are consistent with Grade I diastolic dysfunction (impaired relaxation). Right Ventricle: The right ventricular size is normal. No increase in right ventricular wall thickness. Right ventricular systolic function is normal. Left Atrium: Left atrial size was normal in size. Right Atrium: Right atrial size was normal in size. Pericardium: Trivial pericardial effusion is present. Mitral Valve: The mitral valve is grossly normal. No evidence of mitral valve regurgitation. No evidence of mitral valve stenosis. Tricuspid Valve: The tricuspid valve is grossly normal. Tricuspid valve regurgitation is trivial. No evidence of tricuspid stenosis. Aortic Valve: The aortic valve is tricuspid. Aortic valve regurgitation is not visualized. No aortic stenosis is present. Pulmonic Valve: The pulmonic valve was grossly normal. Pulmonic valve regurgitation is not visualized. No evidence of pulmonic stenosis. Aorta: The aortic root and ascending aorta are structurally normal, with no evidence of dilitation. Venous: The inferior vena cava was not well visualized. IAS/Shunts: The atrial septum is grossly normal.  LEFT VENTRICLE PLAX 2D LVIDd:         4.90 cm  Diastology LVIDs:         3.80 cm  LV e' medial:    6.53 cm/s LV PW:  1.30 cm  LV E/e' medial:  10.7 LV IVS:        0.90 cm  LV e' lateral:   6.64 cm/s LVOT diam:     2.50 cm  LV E/e' lateral: 10.5 LV SV:         96 LV SV Index:   43 LVOT Area:     4.91 cm  RIGHT VENTRICLE RV S prime:     17.00 cm/s TAPSE (M-mode): 2.3 cm LEFT ATRIUM             Index       RIGHT ATRIUM           Index LA diam:        3.90 cm 1.76 cm/m  RA Area:     17.60 cm LA Vol (A2C):   41.9 ml 18.92 ml/m RA Volume:   48.90 ml  22.08 ml/m LA Vol  (A4C):   63.5 ml 28.67 ml/m LA Biplane Vol: 53.1 ml 23.97 ml/m  AORTIC VALVE LVOT Vmax:   100.00 cm/s LVOT Vmean:  63.300 cm/s LVOT VTI:    0.196 m  AORTA Ao Root diam: 4.00 cm Ao Asc diam:  3.30 cm MITRAL VALVE MV Area (PHT): 3.42 cm     SHUNTS MV Decel Time: 222 msec     Systemic VTI:  0.20 m MV E velocity: 70.00 cm/s   Systemic Diam: 2.50 cm MV A velocity: 109.00 cm/s MV E/A ratio:  0.64 Lennie Odor MD Electronically signed by Lennie Odor MD Signature Date/Time: 02/14/2020/3:29:03 PM    Final    CT HEAD CODE STROKE WO CONTRAST  Result Date: 02/14/2020 CLINICAL DATA:  Code stroke.  Acute left-sided weakness. EXAM: CT HEAD WITHOUT CONTRAST TECHNIQUE: Contiguous axial images were obtained from the base of the skull through the vertex without intravenous contrast. COMPARISON:  None. FINDINGS: Brain: Generalized atrophy. No focal finding affects the brainstem. Old small vessel infarction in the right cerebellum. Old appearing infarction in the right occipital lobe. No CT sign of acute parenchymal infarction elsewhere in the cerebral hemispheres. See below. No hydrocephalus or extra-axial collection. Vascular: Question hyperdense right MCA.  This is not definite. Skull: Normal Sinuses/Orbits: Left maxillary and ethmoid sinus opacification. Possible odontogenic related to dental and periodontal disease. Other: None ASPECTS ([place] Stroke Program Early CT Score) - Ganglionic level infarction (caudate, lentiform nuclei, internal capsule, insula, M1-M3 cortex): 7 - Supraganglionic infarction (M4-M6 cortex): 3 Total score (0-10 with 10 being normal): 10 IMPRESSION: 1. Question hyperdense right MCA. This is not definite. 2. No CT sign of acute parenchymal infarction elsewhere in the cerebral hemispheres. Old small vessel infarction in the right cerebellum. Old appearing infarction in the right occipital lobe. 3. ASPECTS is 10. 4. Left maxillary and ethmoid sinus opacification. Possible odontogenic related to  dental and periodontal disease. 5. These results were communicated to Dr. Amada Jupiter at 12:13 pmon 9/17/2021by text page via the [facility] messaging system. Electronically Signed   By: Paulina Fusi M.D.   On: 02/14/2020 12:15   VAS [place] LOWER EXTREMITY VENOUS (DVT)  Result Date: 02/15/2020  Lower Venous DVTStudy Indications: Stroke, and Edema.  Limitations: Significant edema, skin texture. Comparison Study: Prior study from 03/29/13 is available for comparison Performing Technologist: Sherren Kerns RVS  Examination Guidelines: A complete evaluation includes B-mode imaging, spectral Doppler, color Doppler, and power Doppler as needed of all accessible portions of each vessel. Bilateral testing is considered an integral part of a complete examination. Limited examinations for reoccurring indications  may be performed as noted. The reflux portion of the exam is performed with the patient in reverse Trendelenburg.  +---------+---------------+---------+-----------+----------+--------------+ RIGHT    CompressibilityPhasicitySpontaneityPropertiesThrombus Aging +---------+---------------+---------+-----------+----------+--------------+ CFV      Full           Yes      Yes                                 +---------+---------------+---------+-----------+----------+--------------+ SFJ      Full                                                        +---------+---------------+---------+-----------+----------+--------------+ FV Prox  Full                                                        +---------+---------------+---------+-----------+----------+--------------+ FV Mid   Full                                                        +---------+---------------+---------+-----------+----------+--------------+ FV DistalFull                                                        +---------+---------------+---------+-----------+----------+--------------+ PFV      Full                                                         +---------+---------------+---------+-----------+----------+--------------+ POP      Full           Yes      Yes                                 +---------+---------------+---------+-----------+----------+--------------+ PTV                                                   Not visualized +---------+---------------+---------+-----------+----------+--------------+ PERO                                                  Not visualized +---------+---------------+---------+-----------+----------+--------------+   +---------+---------------+---------+-----------+----------+--------------+ LEFT     CompressibilityPhasicitySpontaneityPropertiesThrombus Aging +---------+---------------+---------+-----------+----------+--------------+ CFV      Full           Yes      Yes                                 +---------+---------------+---------+-----------+----------+--------------+  SFJ      Full                                                        +---------+---------------+---------+-----------+----------+--------------+ FV Prox  Full                                                        +---------+---------------+---------+-----------+----------+--------------+ FV Mid   Full                                                        +---------+---------------+---------+-----------+----------+--------------+ FV DistalFull                                                        +---------+---------------+---------+-----------+----------+--------------+ PFV      Full                                                        +---------+---------------+---------+-----------+----------+--------------+ POP      Full           Yes      Yes                                 +---------+---------------+---------+-----------+----------+--------------+ PTV                                                   Not visualized  +---------+---------------+---------+-----------+----------+--------------+ PERO                                                  Not visualized +---------+---------------+---------+-----------+----------+--------------+     Summary: RIGHT: - Findings appear essentially unchanged compared to previous examination. - There is no evidence of deep vein thrombosis in the lower extremity. However, portions of this examination were limited- see technologist comments above.  LEFT: - Findings appear essentially unchanged compared to previous examination. - There is no evidence of deep vein thrombosis in the lower extremity. However, portions of this examination were limited- see technologist comments above.  *See table(s) above for measurements and observations.    Preliminary     DISCHARGE EXAMINATION: See progress note from earlier today  DISPOSITION: Home  Discharge Instructions    Ambulatory referral to Neurology   Complete by: As directed    An appointment is requested in approximately: 4 weeks for stroke  f/u   Ambulatory referral to Occupational Therapy   Complete by: As directed    Acute stroke   Ambulatory referral to Physical Therapy   Complete by: As directed    Acute stroke   Ambulatory referral to Speech Therapy   Complete by: As directed    Call MD for:  difficulty breathing, headache or visual disturbances   Complete by: As directed    Call MD for:  extreme fatigue   Complete by: As directed    Call MD for:  persistant dizziness or light-headedness   Complete by: As directed    Call MD for:  persistant nausea and vomiting   Complete by: As directed    Call MD for:  severe uncontrolled pain   Complete by: As directed    Call MD for:  temperature >100.4   Complete by: As directed    Diet - low sodium heart healthy   Complete by: As directed    Discharge instructions   Complete by: As directed    Please take your medications as prescribed.  You will get a call from the  cardiology office for placement of loop recorder.    You were cared for by a hospitalist during your hospital stay. If you have any questions about your discharge medications or the care you received while you were in the hospital after you are discharged, you can call the unit and asked to speak with the hospitalist on call if the hospitalist that took care of you is not available. Once you are discharged, your primary care physician will handle any further medical issues. Please note that NO REFILLS for any discharge medications will be authorized once you are discharged, as it is imperative that you return to your primary care physician (or establish a relationship with a primary care physician if you do not have one) for your aftercare needs so that they can reassess your need for medications and monitor your lab values. If you do not have a primary care physician, you can call [phone] for a physician referral.   Increase activity slowly   Complete by: As directed    No wound care   Complete by: As directed         Allergies as of 02/15/2020      Reactions   Cashew Nut Oil Anaphylaxis   Cashews, 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   Pistachio Nut (diagnostic) Hives, Shortness Of Breath, Itching, Swelling, Rash   Latex Dermatitis   Tape Other (See Comments)   Tears and bruises the skin   Betadine [povidone Iodine] Rash, Other (See Comments)   Raised rash 04/23/14   Lidocaine Other (See Comments)   Minimal effect with lidocaine, prefers different anesthetic   Penicillins Rash, Other (See Comments)   Rash all over as a child      Medication List    STOP taking these medications   varenicline 1 MG tablet Commonly known as: Chantix   vitamin C 500 MG tablet Commonly known as: ASCORBIC ACID     TAKE these medications   acetaminophen 500 MG tablet Commonly known as: TYLENOL Take 1,000 mg by mouth 2 (two) times daily as needed (for pain).   aspirin 81 MG EC tablet Take 1 tablet (81 mg  total) by mouth daily. Swallow whole. Start taking on: February 16, 2020   bromocriptine 2.5 MG tablet Commonly known as: PARLODEL Take 0.5 tablets (1.25 mg total) by mouth at bedtime.   clopidogrel 75 MG tablet Commonly known as:  PLAVIX Take 1 tablet (75 mg total) by mouth daily for 21 days. FOR 3 WEEKS ONLY   colesevelam 625 MG tablet Commonly known as: WELCHOL Take 2 tablets (1,250 mg total) by mouth daily. What changed:   how much to take  when to take this   Contour Next Test test strip Generic drug: glucose blood Use to check blood sugar two times a day. What changed:   how much to take  how to take this  when to take this  additional instructions   EPINEPHrine 0.3 mg/0.3 mL Soaj injection Commonly known as: EPI-PEN INJECT 0.3 ML INTO THE  MUSCLE ONCE FOR 1 DOSE. What changed:   how much to take  how to take this  when to take this  reasons to take this  additional instructions   Excedrin Migraine 250-250-65 MG tablet Generic drug: aspirin-acetaminophen-caffeine Take 2 tablets by mouth every 6 (six) hours as needed for headache or migraine.   Farxiga 10 MG Tabs tablet Generic drug: dapagliflozin propanediol Take 10 mg by mouth daily before breakfast.   gabapentin 300 MG capsule Commonly known as: NEURONTIN TAKE 3 CAPSULES BY MOUTH 3  TIMES DAILY What changed: See the new instructions.   glipiZIDE 10 MG 24 hr tablet Commonly known as: GLUCOTROL XL TAKE 2 TABLETS BY MOUTH  DAILY WITH BREAKFAST What changed:   how much to take  how to take this  when to take this  additional instructions   Lancets Micro Thin 33G Misc Use to check blood sugar two times daily. What changed:   how much to take  how to take this  when to take this  additional instructions   lisinopril-hydrochlorothiazide 20-12.5 MG tablet Commonly known as: ZESTORETIC Take 1 tablet by mouth daily.   meloxicam 15 MG tablet Commonly known as: MOBIC TAKE 1  TABLET BY MOUTH IN  THE MORNING What changed:   how much to take  how to take this  when to take this  additional instructions   metFORMIN 500 MG 24 hr tablet Commonly known as: GLUCOPHAGE-XR TAKE 4 TABLETS BY MOUTH  DAILY WITH BREAKFAST What changed: See the new instructions.   multivitamin tablet Take 1 tablet by mouth daily.   pantoprazole 40 MG tablet Commonly known as: PROTONIX TAKE 1 TABLET BY MOUTH  DAILY What changed: when to take this   PARoxetine 30 MG tablet Commonly known as: PAXIL TAKE 1 TABLET BY MOUTH IN  THE MORNING What changed: when to take this   pentoxifylline 400 MG CR tablet Commonly known as: TRENTAL TAKE 1 TABLET BY MOUTH 3  TIMES DAILY WITH MEALS What changed:   how much to take  how to take this  when to take this  additional instructions   rOPINIRole 0.5 MG tablet Commonly known as: REQUIP TAKE 1 TABLET BY MOUTH  TWICE DAILY What changed: when to take this   rosuvastatin 20 MG tablet Commonly known as: CRESTOR Take 1 tablet (20 mg total) by mouth at bedtime. What changed:   medication strength  how much to take  when to take this   sildenafil 100 MG tablet Commonly known as: VIAGRA Take 1 tablet (100 mg total) by mouth daily as needed for erectile dysfunction.   Trulicity 4.5 MG/0.5ML Sopn Generic drug: Dulaglutide Inject 4.5 mg into the skin once a week. What changed: when to take this         Follow-up Information    Copland, Karleen Hampshire, MD. Schedule an appointment as  soon as possible for a visit in 1 week(s).   Specialties: Family Medicine, Sports Medicine Contact information: [address] [place] [address] [phone]        Micki RileySethi, Pramod S, MD. Schedule an appointment as soon as possible for a visit in 4 week(s).   Specialties: Neurology, Radiology Contact information: [address] [place] [address] [phone]        [facility] Follow [facility].   Specialty: Rehabilitation Why: For ST, PT OT. They will call you this week to schedule something.  Contact information: [address] [id number] mc [place] [phone]              TOTAL DISCHARGE TIME: 35 minutes  Stephanine Reas Rito EhrlichKrishnan  [facility] Pager on [email or web address]  02/15/2020, 5:48 PM

## 2020-02-15 NOTE — Evaluation (Signed)
Speech Language Pathology Evaluation Patient Details Name: [name] [name] MRN: [id number] DOB: 05-12-1961 Today's Date: 02/15/2020 Time: 3154-0086 SLP Time Calculation (min) (ACUTE ONLY): 39 min  Problem List:  Patient Active Problem List   Diagnosis Date Noted  . Depression   . GERD (gastroesophageal reflux disease)   . Ischemic stroke (HCC)   . Diabetic polyneuropathy associated with type 2 diabetes mellitus (HCC) 06/30/2018  . Other chronic pain 06/30/2018  . Erectile dysfunction due to type 2 diabetes mellitus (HCC) 06/30/2018  . Circulation disorder of lower extremity 05/31/2015  . IBS (irritable bowel syndrome) 12/24/2014  . 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 Diabetic leg ulcer (HCC) 07/18/2013  . Restless legs syndrome (RLS) 11/20/2012  . Generalized anxiety disorder   . Diabetes mellitus, type II (HCC)   . Hyperlipidemia LDL goal <70 01/16/2009  . TOBACCO ABUSE 07/27/2007  . Morbid obesity (HCC) 09/29/2006  . Essential hypertension 09/29/2006  . CALCULUS, KIDNEY 05/30/2002   Past Medical History:  Past Medical History:  Diagnosis Date  . Angio-edema   . Anxiety   . Depression   . Diabetes mellitus type II    type 2  . GERD (gastroesophageal reflux disease)   . Headache    migraine  . History of kidney stones   . HLD (hyperlipidemia)   . HTN (hypertension)   . Morbid obesity (HCC)   . Restless legs syndrome (RLS) 11/20/2012  . Tobacco abuse   . Ulcer of left ankle (HCC)    last 2 months dime size dry dressing changing q day  . Urticaria    Past Surgical History:  Past Surgical History:  Procedure Laterality Date  . Arm surgery Right 1969   Abscess excision and debridement at elbow  . COLONOSCOPY WITH PROPOFOL N/A 09/06/2016   Procedure: COLONOSCOPY WITH PROPOFOL;  Surgeon: Sherrilyn Rist, MD;  Location: [facility];  Service: Gastroenterology;  Laterality: N/A;  . I & D EXTREMITY Left 05/29/2014   Procedure:  IRRIGATION AND DEBRIDEMENT OF LEFT LEG WOUND AND PLACEMENT OF  INTEGRA  AND  VAC;  Surgeon: Wayland Denis, DO;  Location: [facility];  Service: Plastics;  Laterality: Left;  . INCISION AND DRAINAGE OF WOUND Left 04/23/2014   Procedure: IRRIGATION AND DEBRIDEMENT LOWER LEFT LEG WOUND WITH PLACEMENT OF INTEGRA AND VAC;  Surgeon: Wayland Denis, DO;  Location: [facility];  Service: Plastics;  Laterality: Left;  . kidney stone removal  2004  . MINOR APPLICATION OF WOUND VAC Left 04/23/2014   Procedure: MINOR APPLICATION OF WOUND VAC;  Surgeon: Wayland Denis, DO;  Location: [facility];  Service: Plastics;  Laterality: Left;  . SKIN SPLIT GRAFT Left 06/18/2014   Procedure: SKIN GRAFT SPLIT THICKNESS TO LOWER LEFT LEG WOUND WITH PLACEMENT OF VAC;  Surgeon: Wayland Denis, DO;  Location: [facility];  Service: Plastics;  Laterality: Left;  Marland Kitchen VASECTOMY  1994   HPI:  [name] [name] is a 59 y.o. male with medical history significant of hypertension, hyperlipidemia, type 2 diabetes mellitus, restless leg syndrome, diabetic neuropathy, morbid obesity, tobacco abuse, depression/anxiety, GERD, chronic venous stasis ulcer in bilateral lower extremities presents to emergency department with headache and right thigh vision loss started 2 AM. Ct of the head with Acute infarct in the left occipital parietal lobe with progressive low-density since the prior studies. Negative for hemorrhagic transformation.   Assessment / Plan / Recommendation Clinical Impression  Patient presents with  aphasia impacting both expressive and receptive language as well as dyslexia/alexia and difficulty writing. Patient extremely receptive to cueing (sentence completion and phonemic) to improve ability to verbally communicate. Discussed results of exam with patient and wife, educated on need for OP services, as well as techniques which may be used at home to assist in improving  communication. 24 hour supervision recommended.     SLP Assessment  SLP Recommendation/Assessment: All further Speech Lanaguage Pathology  needs can be addressed in the next venue of care SLP Visit Diagnosis: Aphasia (R47.01)    Follow Up Recommendations  Outpatient SLP    Frequency and Duration           SLP Evaluation Cognition  Overall Cognitive Status: Impaired/Different from baseline Arousal/Alertness: Awake/alert Orientation Level: Oriented to person;Oriented to place;Oriented to situation Attention: Sustained Sustained Attention: Appears intact Memory: Appears intact Awareness: Appears intact Problem Solving: Appears intact Comments: aphasia makes diagnostics of cognition challenging       Comprehension  Auditory Comprehension Overall Auditory Comprehension: Impaired Yes/No Questions: Within Functional Limits Commands: Impaired (multipstep commands) Visual Recognition/Discrimination Discrimination: Within Function Limits Reading Comprehension Reading Status: Impaired Sentence Level: Impaired Interfering Components: Visual scanning (rigiht visual field cut)    Expression Expression Primary Mode of Expression: Verbal Verbal Expression Overall Verbal Expression: Impaired Initiation: No impairment Automatic Speech: Name;Social Response Level of Generative/Spontaneous Verbalization: Conversation Repetition: Impaired Level of Impairment: Sentence level Naming: Impairment Responsive: 76-100% accurate Confrontation: Impaired Convergent: 25-49% accurate Divergent: 0-24% accurate Verbal Errors: Phonemic paraphasias;Aware of errors Pragmatics: No impairment Effective Techniques: Sentence completion;Phonemic cues Written Expression Dominant Hand: Right Written Expression: Exceptions to [facility] Dictation Ability: Word (impaired) Self Formulation Ability: Word (impaired)   Oral / Motor  Motor Speech Overall Motor Speech: Appears within functional limits for tasks  assessed Respiration: Within functional limits   GO                   Geniene List MA, CCC-SLP   Jurrell Royster Meryl 02/15/2020, 4:16 PM

## 2020-02-19 ENCOUNTER — Ambulatory Visit: Payer: Managed Care, Other (non HMO) | Attending: Internal Medicine

## 2020-02-19 ENCOUNTER — Other Ambulatory Visit: Payer: Self-pay

## 2020-02-19 DIAGNOSIS — R4701 Aphasia: Secondary | ICD-10-CM

## 2020-02-19 DIAGNOSIS — R2681 Unsteadiness on feet: Secondary | ICD-10-CM | POA: Insufficient documentation

## 2020-02-19 DIAGNOSIS — R41841 Cognitive communication deficit: Secondary | ICD-10-CM

## 2020-02-19 NOTE — Therapy (Signed)
[facility] [address] [place], [place], [address] Phone: [phone]   Fax:  [phone]  Patient Details  Name: Havoc Sanluis MRN: [id number] Date of Birth: 01/16/1961 Referring Provider:  Osvaldo Shipper, MD  Encounter Date: 02/19/2020   ST - Arrive/Cancel  SLP initiated ST eval today however pt experienced a very high level of anxiety during the session manifested by tearfulness x2, short episodes of crying x2, deep and heavy breathing x4, and verbalizing to himself, "I can do this, I can do this!" x2. SLP decided it would be best to hold ST eval today as results would likely not be valid due to pt anxiety level. ST eval was rescheduled for 1-2 weeks. In that time pt should have more spontaneous recovery time and will likely not experience as much anxiety. Lower anxiety levels will very likely result in speech/language testing results being more representative of pt's true language level.   [facility] ,MS, CCC-SLP 02/19/2020, 1:35 PM   [facility] [address] [place], [place], [address] Phone: [phone]   Fax:  [phone]

## 2020-02-20 ENCOUNTER — Encounter: Payer: Self-pay | Admitting: Family Medicine

## 2020-02-20 ENCOUNTER — Ambulatory Visit (INDEPENDENT_AMBULATORY_CARE_PROVIDER_SITE_OTHER): Payer: Managed Care, Other (non HMO) | Admitting: Family Medicine

## 2020-02-20 VITALS — BP 114/72 | HR 85 | Temp 97.7°F | Ht 69.5 in | Wt 330.0 lb

## 2020-02-20 DIAGNOSIS — I10 Essential (primary) hypertension: Secondary | ICD-10-CM

## 2020-02-20 DIAGNOSIS — I639 Cerebral infarction, unspecified: Secondary | ICD-10-CM | POA: Diagnosis not present

## 2020-02-20 DIAGNOSIS — E1159 Type 2 diabetes mellitus with other circulatory complications: Secondary | ICD-10-CM

## 2020-02-20 DIAGNOSIS — I999 Unspecified disorder of circulatory system: Secondary | ICD-10-CM

## 2020-02-20 DIAGNOSIS — I700 Atherosclerosis of aorta: Secondary | ICD-10-CM

## 2020-02-20 DIAGNOSIS — E785 Hyperlipidemia, unspecified: Secondary | ICD-10-CM

## 2020-02-20 DIAGNOSIS — E1142 Type 2 diabetes mellitus with diabetic polyneuropathy: Secondary | ICD-10-CM

## 2020-02-20 DIAGNOSIS — H5461 Unqualified visual loss, right eye, normal vision left eye: Secondary | ICD-10-CM

## 2020-02-20 DIAGNOSIS — F17200 Nicotine dependence, unspecified, uncomplicated: Secondary | ICD-10-CM

## 2020-02-20 DIAGNOSIS — I6523 Occlusion and stenosis of bilateral carotid arteries: Secondary | ICD-10-CM

## 2020-02-20 DIAGNOSIS — Z23 Encounter for immunization: Secondary | ICD-10-CM | POA: Diagnosis not present

## 2020-02-20 MED ORDER — PREGABALIN 75 MG PO CAPS: 75.0000 mg | ORAL_CAPSULE | Freq: Two times a day (BID) | ORAL | 1 refills | Status: DC

## 2020-02-20 NOTE — Patient Instructions (Addendum)
DO NOT CHANGE YOUR PAXIL DOSE   Gabapentin - you have been taking 1,200 mg each morning and night (4 tablets)  Today:  Take 3 tablets, 900 mg at night.  Tomorrow: Take 3 tablets (900 mg) in the morning and 900 mg (3 tablets) in the evening.  In 3 days: take 2 tablets (600 mg) in the morning and 900 mg (3 tablets in the evening)  3 days after that, decrease dose:  2 tablets (600 mg) in the morning and 2 tablets (600 mg) in the evening.  3 days after that, decrease dose: 1 tablet (300 mg) in the morning and 2 tablets (600 mg) in the evening.  3 days after that, decrease dose: 1 tablet (300 mg) in the morning and 1 tablet (300 mg in the evening.  3 days after that, decrease dose to no tablets in the morning and 1 tablet (300 mg at night)  Then stop after 3 days and you can start LYRICA.  LYRICA 50 MG, START 75 MG (1 CAPSULE) 2   TIMES A DAY

## 2020-02-20 NOTE — Progress Notes (Signed)
[name] [name] T. [name], MD, CAQ Sports Medicine  Primary Care and Sports Medicine [facility] at [facility] [address] [place], [address]  Phone: [phone]  FAX: [phone]  [name] [name] - 59 y.o. male  MRN [id number]  Date of Birth: 06-10-1960  Date: 02/20/2020  PCP: [name] Beat, MD  Referral: [name] Beat, MD  Chief Complaint  Patient presents with  . Hospitalization Follow-up    Here with wife, [name] [name]. Has paperwork that needs to be filled out. [facility] 9-17 to 9-18.     This visit occurred during the SARS-CoV-2 public health emergency.  Safety protocols were in place, including screening questions prior to the visit, additional usage of staff PPE, and extensive cleaning of exam room while observing appropriate contact time as indicated for disinfecting solutions.   Subjective:   [name] [name] is a 59 y.o. 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  The patient had a headache, then he went back to bed and he awoke and his headache was quite bad.  And he subsequently had vision impairment on the right and he went to his eye doctor.  His eye doctor felt as if this was not a vision issue predominantly and he needed to be evaluated for stroke.  By the time he went and arrived in the ER he was outside the window for TPA.  He was admitted by the hospitalist and had inpatient neurological consultation.  His imaging studies are below and reviewed.  He is currently working with [facility] neuro rehab.  He did stop his gabapentin for a while had significant recurrence of peripheral neuropathy, and this was better when he took his wife's Lyrica.  He would like to change to Lyrica.  He also has become more tearful, and he has a history of depression, and he does think that this is worse.  Neurology follow-up (Dr. Pearlean Brownie at [facility]) --  seeing at the Nov 18th. Loop recorder? Cardiology  PT and OT referral made  - NEUROREHAB  WIFE STAYED in the hospital  R side with vision loss  Cardiology has not been set up (loop recorder)  SEND A NOTE TO CARDIOLOGY TO ASK ABOUT THE LOOP RECORDER.  WANTS TO TRY SOME LYRICA - CHANGE FROM GABAPENTIN  Eating healthier  He also wants to quit smoking.  He has a significant drive to lose weight.  Review of Systems is noted in the HPI, as appropriate  Objective:   BP 114/72 (BP Location: Left Arm, Patient Position: Sitting, Cuff Size: Large)   Pulse 85   Temp 97.7 F (36.5 C)   Ht 5' 9.5" (1.765 m)   Wt (!) 330 lb (149.7 kg)   SpO2 97%   BMI 48.03 kg/m   GEN: WDWN, NAD, Non-toxic HEENT: Atraumatic, Normocephalic. Neck supple. No masses. CV: RRR, No M/G/R. No JVD. No thrill. No extra heart sounds. PULM: CTA B, no wheezes, crackles, rhonchi. No retractions. No resp. distress. No accessory muscle use. EXTR: He does have some chronic small ulcers with intermittent healing in the lower extremities.  These have been present off and on for quite a number of years.  Significantly better than in prior years. NEURO Normal gait.  PSYCH: Normally interactive. Conversant.   Laboratory and Imaging Data: CT Angio Head W or Wo Contrast  Result Date: 02/14/2020 CLINICAL DATA:  Visual disturbance.  Acute stroke suspected. EXAM: CT  ANGIOGRAPHY HEAD AND NECK CT PERFUSION BRAIN TECHNIQUE: Multidetector CT imaging of the head and neck was performed using the standard protocol during bolus administration of intravenous contrast. Multiplanar CT image reconstructions and MIPs were obtained to evaluate the vascular anatomy. Carotid stenosis measurements (when applicable) are obtained utilizing NASCET criteria, using the distal internal carotid diameter as the denominator. Multiphase CT imaging of the brain was performed following IV bolus contrast injection. Subsequent parametric perfusion maps were  calculated using RAPID software. CONTRAST:  75mL OMNIPAQUE IOHEXOL 350 MG/ML SOLN COMPARISON:  Head CT earlier same day FINDINGS: CTA NECK FINDINGS Aortic arch: Aortic atherosclerosis.  Branching pattern is normal. Right carotid system: Common carotid artery widely patent to the bifurcation. Carotid bifurcation is normal without soft or calcified plaque. No stenosis. Left carotid system: Common carotid artery widely patent to the bifurcation. Soft and calcified plaque at the carotid bifurcation. No stenosis compared to the diameter of the more distal cervical ICA. Vertebral arteries: Both vertebral artery origins are widely patent. Both vertebral arteries appear normal through the cervical region to the foramen magnum. Skeleton: Ordinary cervical spondylosis. Other neck: No mass or lymphadenopathy. Upper chest: Mild scarring at the lung apices. Review of the MIP images confirms the above findings CTA HEAD FINDINGS Anterior circulation: Both internal carotid arteries widely patent through the skull base and siphon regions. Mild atherosclerotic calcification of the carotid siphons but no stenosis. The anterior and middle cerebral vessels are patent. No large or medium vessel occlusion. No correctable proximal stenosis. Posterior circulation: Both vertebral arteries widely patent to the basilar. No basilar stenosis. Posterior circulation branch vessels are patent. Some distal atherosclerotic irregularity in the PCA branches. Venous sinuses: Patent and normal. Anatomic variants: None significant. Review of the MIP images confirms the above findings CT Brain Perfusion Findings: ASPECTS: 9 CBF (<30%) Volume: 10mL Perfusion (Tmax>6.0s) volume: 21mL Mismatch Volume: 11mL Infarction Location:Left occipital cortex IMPRESSION: 1. 10 CC region of completed infarction in the left occipital cortex. Additional 11 cc of at risk adjacent penumbra. 2. No large or medium vessel occlusion visible. Patient does have atherosclerotic  irregularity of the more distal PCA branches. 3. Atherosclerotic disease at the left carotid bifurcation but without stenosis. 4. Aortic atherosclerosis. Aortic Atherosclerosis (ICD10-I70.0). Electronically Signed   By: Paulina Fusi M.D.   On: 02/14/2020 12:47   CT HEAD WO CONTRAST  Result Date: 02/15/2020 CLINICAL DATA:  Stroke follow-up EXAM: CT HEAD WITHOUT CONTRAST TECHNIQUE: Contiguous axial images were obtained from the base of the skull through the vertex without intravenous contrast. COMPARISON:  CT angio head and neck and CT perfusion 02/14/2020 FINDINGS: Brain: Developing hypodensity in the left occipital parietal lobe compatible with acute infarct. This corresponds to the CT perfusion abnormality. No hemorrhagic transformation Small chronic infarcts in the right cerebellum. Small chronic infarct right occipital lobe. Ventricle size normal. Negative for hemorrhage or mass. Vascular: Negative for focal hyperdense vessel. The circle Willis is diffusely hyperdense. Skull: Negative Sinuses/Orbits: Complete opacification left maxillary sinus unchanged. Mild mucosal edema ethmoid sinus bilaterally. Negative orbit. Other: None IMPRESSION: Acute infarct in the left occipital parietal lobe with progressive low-density since the prior studies. Negative for hemorrhagic transformation. Electronically Signed   By: Marlan Palau M.D.   On: 02/15/2020 14:05   CT Angio Neck W and/or Wo Contrast  Result Date: 02/14/2020 CLINICAL DATA:  Visual disturbance.  Acute stroke suspected. EXAM: CT ANGIOGRAPHY HEAD AND NECK CT PERFUSION BRAIN TECHNIQUE: Multidetector CT imaging of the head and neck was performed using the  standard protocol during bolus administration of intravenous contrast. Multiplanar CT image reconstructions and MIPs were obtained to evaluate the vascular anatomy. Carotid stenosis measurements (when applicable) are obtained utilizing NASCET criteria, using the distal internal carotid diameter as the  denominator. Multiphase CT imaging of the brain was performed following IV bolus contrast injection. Subsequent parametric perfusion maps were calculated using RAPID software. CONTRAST:  75mL OMNIPAQUE IOHEXOL 350 MG/ML SOLN COMPARISON:  Head CT earlier same day FINDINGS: CTA NECK FINDINGS Aortic arch: Aortic atherosclerosis.  Branching pattern is normal. Right carotid system: Common carotid artery widely patent to the bifurcation. Carotid bifurcation is normal without soft or calcified plaque. No stenosis. Left carotid system: Common carotid artery widely patent to the bifurcation. Soft and calcified plaque at the carotid bifurcation. No stenosis compared to the diameter of the more distal cervical ICA. Vertebral arteries: Both vertebral artery origins are widely patent. Both vertebral arteries appear normal through the cervical region to the foramen magnum. Skeleton: Ordinary cervical spondylosis. Other neck: No mass or lymphadenopathy. Upper chest: Mild scarring at the lung apices. Review of the MIP images confirms the above findings CTA HEAD FINDINGS Anterior circulation: Both internal carotid arteries widely patent through the skull base and siphon regions. Mild atherosclerotic calcification of the carotid siphons but no stenosis. The anterior and middle cerebral vessels are patent. No large or medium vessel occlusion. No correctable proximal stenosis. Posterior circulation: Both vertebral arteries widely patent to the basilar. No basilar stenosis. Posterior circulation branch vessels are patent. Some distal atherosclerotic irregularity in the PCA branches. Venous sinuses: Patent and normal. Anatomic variants: None significant. Review of the MIP images confirms the above findings CT Brain Perfusion Findings: ASPECTS: 9 CBF (<30%) Volume: 10mL Perfusion (Tmax>6.0s) volume: 21mL Mismatch Volume: 11mL Infarction Location:Left occipital cortex IMPRESSION: 1. 10 CC region of completed infarction in the left  occipital cortex. Additional 11 cc of at risk adjacent penumbra. 2. No large or medium vessel occlusion visible. Patient does have atherosclerotic irregularity of the more distal PCA branches. 3. Atherosclerotic disease at the left carotid bifurcation but without stenosis. 4. Aortic atherosclerosis. Aortic Atherosclerosis (ICD10-I70.0). Electronically Signed   By: Paulina Fusi M.D.   On: 02/14/2020 12:47   CT Code Stroke Cerebral Perfusion with contrast  Result Date: 02/14/2020 CLINICAL DATA:  Visual disturbance.  Acute stroke suspected. EXAM: CT ANGIOGRAPHY HEAD AND NECK CT PERFUSION BRAIN TECHNIQUE: Multidetector CT imaging of the head and neck was performed using the standard protocol during bolus administration of intravenous contrast. Multiplanar CT image reconstructions and MIPs were obtained to evaluate the vascular anatomy. Carotid stenosis measurements (when applicable) are obtained utilizing NASCET criteria, using the distal internal carotid diameter as the denominator. Multiphase CT imaging of the brain was performed following IV bolus contrast injection. Subsequent parametric perfusion maps were calculated using RAPID software. CONTRAST:  75mL OMNIPAQUE IOHEXOL 350 MG/ML SOLN COMPARISON:  Head CT earlier same day FINDINGS: CTA NECK FINDINGS Aortic arch: Aortic atherosclerosis.  Branching pattern is normal. Right carotid system: Common carotid artery widely patent to the bifurcation. Carotid bifurcation is normal without soft or calcified plaque. No stenosis. Left carotid system: Common carotid artery widely patent to the bifurcation. Soft and calcified plaque at the carotid bifurcation. No stenosis compared to the diameter of the more distal cervical ICA. Vertebral arteries: Both vertebral artery origins are widely patent. Both vertebral arteries appear normal through the cervical region to the foramen magnum. Skeleton: Ordinary cervical spondylosis. Other neck: No mass or lymphadenopathy. Upper  chest: Mild  scarring at the lung apices. Review of the MIP images confirms the above findings CTA HEAD FINDINGS Anterior circulation: Both internal carotid arteries widely patent through the skull base and siphon regions. Mild atherosclerotic calcification of the carotid siphons but no stenosis. The anterior and middle cerebral vessels are patent. No large or medium vessel occlusion. No correctable proximal stenosis. Posterior circulation: Both vertebral arteries widely patent to the basilar. No basilar stenosis. Posterior circulation branch vessels are patent. Some distal atherosclerotic irregularity in the PCA branches. Venous sinuses: Patent and normal. Anatomic variants: None significant. Review of the MIP images confirms the above findings CT Brain Perfusion Findings: ASPECTS: 9 CBF (<30%) Volume: 10mL Perfusion (Tmax>6.0s) volume: 21mL Mismatch Volume: 11mL Infarction Location:Left occipital cortex IMPRESSION: 1. 10 CC region of completed infarction in the left occipital cortex. Additional 11 cc of at risk adjacent penumbra. 2. No large or medium vessel occlusion visible. Patient does have atherosclerotic irregularity of the more distal PCA branches. 3. Atherosclerotic disease at the left carotid bifurcation but without stenosis. 4. Aortic atherosclerosis. Aortic Atherosclerosis (ICD10-I70.0). Electronically Signed   By: Paulina Fusi M.D.   On: 02/14/2020 12:47   ECHOCARDIOGRAM COMPLETE  Result Date: 02/14/2020    ECHOCARDIOGRAM REPORT   Patient Name:   ANDRAE CLAUNCH Date of Exam: 02/14/2020 Medical Rec #:  [id number]      Height:       70.0 in Accession #:    [id number]     Weight:       230.0 lb Date of Birth:  1960-07-21     BSA:          2.215 m Patient Age:    58 years       BP:           142/96 mmHg Patient Gender: M              HR:           88 bpm. Exam Location:  Inpatient Procedure: 2D Echo Indications:    stroke 434.91  History:        Patient has no prior history of Echocardiogram  examinations.                 Risk Factors:Diabetes, Dyslipidemia, Hypertension and Current                 Smoker.  Sonographer:    Delcie Roch Referring Phys: [id number] Kasandra Knudsen PAHWANI IMPRESSIONS  1. Left ventricular ejection fraction, by estimation, is 60 to 65%. The left ventricle has normal function. The left ventricle has no regional wall motion abnormalities. Left ventricular diastolic parameters are consistent with Grade I diastolic dysfunction (impaired relaxation).  2. Right ventricular systolic function is normal. The right ventricular size is normal.  3. The mitral valve is grossly normal. No evidence of mitral valve regurgitation. No evidence of mitral stenosis.  4. The aortic valve is tricuspid. Aortic valve regurgitation is not visualized. No aortic stenosis is present. Conclusion(s)/Recommendation(s): No intracardiac source of embolism detected on this transthoracic study. A transesophageal echocardiogram is recommended to exclude cardiac source of embolism if clinically indicated. FINDINGS  Left Ventricle: Left ventricular ejection fraction, by estimation, is 60 to 65%. The left ventricle has normal function. The left ventricle has no regional wall motion abnormalities. The left ventricular internal cavity size was normal in size. There is  no left ventricular hypertrophy. Left ventricular diastolic parameters are consistent with Grade I diastolic dysfunction (impaired relaxation). Right Ventricle: The right ventricular  size is normal. No increase in right ventricular wall thickness. Right ventricular systolic function is normal. Left Atrium: Left atrial size was normal in size. Right Atrium: Right atrial size was normal in size. Pericardium: Trivial pericardial effusion is present. Mitral Valve: The mitral valve is grossly normal. No evidence of mitral valve regurgitation. No evidence of mitral valve stenosis. Tricuspid Valve: The tricuspid valve is grossly normal. Tricuspid valve  regurgitation is trivial. No evidence of tricuspid stenosis. Aortic Valve: The aortic valve is tricuspid. Aortic valve regurgitation is not visualized. No aortic stenosis is present. Pulmonic Valve: The pulmonic valve was grossly normal. Pulmonic valve regurgitation is not visualized. No evidence of pulmonic stenosis. Aorta: The aortic root and ascending aorta are structurally normal, with no evidence of dilitation. Venous: The inferior vena cava was not well visualized. IAS/Shunts: The atrial septum is grossly normal.  LEFT VENTRICLE PLAX 2D LVIDd:         4.90 cm  Diastology LVIDs:         3.80 cm  LV e' medial:    6.53 cm/s LV PW:         1.30 cm  LV E/e' medial:  10.7 LV IVS:        0.90 cm  LV e' lateral:   6.64 cm/s LVOT diam:     2.50 cm  LV E/e' lateral: 10.5 LV SV:         96 LV SV Index:   43 LVOT Area:     4.91 cm  RIGHT VENTRICLE RV S prime:     17.00 cm/s TAPSE (M-mode): 2.3 cm LEFT ATRIUM             Index       RIGHT ATRIUM           Index LA diam:        3.90 cm 1.76 cm/m  RA Area:     17.60 cm LA Vol (A2C):   41.9 ml 18.92 ml/m RA Volume:   48.90 ml  22.08 ml/m LA Vol (A4C):   63.5 ml 28.67 ml/m LA Biplane Vol: 53.1 ml 23.97 ml/m  AORTIC VALVE LVOT Vmax:   100.00 cm/s LVOT Vmean:  63.300 cm/s LVOT VTI:    0.196 m  AORTA Ao Root diam: 4.00 cm Ao Asc diam:  3.30 cm MITRAL VALVE MV Area (PHT): 3.42 cm     SHUNTS MV Decel Time: 222 msec     Systemic VTI:  0.20 m MV E velocity: 70.00 cm/s   Systemic Diam: 2.50 cm MV A velocity: 109.00 cm/s MV E/A ratio:  0.64 Lennie Odor MD Electronically signed by Lennie Odor MD Signature Date/Time: 02/14/2020/3:29:03 PM    Final    CT HEAD CODE STROKE WO CONTRAST  Result Date: 02/14/2020 CLINICAL DATA:  Code stroke.  Acute left-sided weakness. EXAM: CT HEAD WITHOUT CONTRAST TECHNIQUE: Contiguous axial images were obtained from the base of the skull through the vertex without intravenous contrast. COMPARISON:  None. FINDINGS: Brain: Generalized atrophy.  No focal finding affects the brainstem. Old small vessel infarction in the right cerebellum. Old appearing infarction in the right occipital lobe. No CT sign of acute parenchymal infarction elsewhere in the cerebral hemispheres. See below. No hydrocephalus or extra-axial collection. Vascular: Question hyperdense right MCA.  This is not definite. Skull: Normal Sinuses/Orbits: Left maxillary and ethmoid sinus opacification. Possible odontogenic related to dental and periodontal disease. Other: None ASPECTS ([place]berta Stroke Program Early CT Score) - Ganglionic level infarction (caudate, lentiform  nuclei, internal capsule, insula, M1-M3 cortex): 7 - Supraganglionic infarction (M4-M6 cortex): 3 Total score (0-10 with 10 being normal): 10 IMPRESSION: 1. Question hyperdense right MCA. This is not definite. 2. No CT sign of acute parenchymal infarction elsewhere in the cerebral hemispheres. Old small vessel infarction in the right cerebellum. Old appearing infarction in the right occipital lobe. 3. ASPECTS is 10. 4. Left maxillary and ethmoid sinus opacification. Possible odontogenic related to dental and periodontal disease. 5. These results were communicated to Dr. Amada Jupiter at 12:13 pmon 9/17/2021by text page via the [facility] messaging system. Electronically Signed   By: Paulina Fusi M.D.   On: 02/14/2020 12:15   VAS [place] LOWER EXTREMITY VENOUS (DVT)  Result Date: 02/16/2020  Lower Venous DVTStudy Indications: Stroke, and Edema.  Limitations: Significant edema, skin texture. Comparison Study: Prior study from 03/29/13 is available for comparison Performing Technologist: Sherren Kerns RVS  Examination Guidelines: A complete evaluation includes B-mode imaging, spectral Doppler, color Doppler, and power Doppler as needed of all accessible portions of each vessel. Bilateral testing is considered an integral part of a complete examination. Limited examinations for reoccurring indications may be performed as noted. The reflux  portion of the exam is performed with the patient in reverse Trendelenburg.  +---------+---------------+---------+-----------+----------+--------------+ RIGHT    CompressibilityPhasicitySpontaneityPropertiesThrombus Aging +---------+---------------+---------+-----------+----------+--------------+ CFV      Full           Yes      Yes                                 +---------+---------------+---------+-----------+----------+--------------+ SFJ      Full                                                        +---------+---------------+---------+-----------+----------+--------------+ FV Prox  Full                                                        +---------+---------------+---------+-----------+----------+--------------+ FV Mid   Full                                                        +---------+---------------+---------+-----------+----------+--------------+ FV DistalFull                                                        +---------+---------------+---------+-----------+----------+--------------+ PFV      Full                                                        +---------+---------------+---------+-----------+----------+--------------+ POP      Full  Yes      Yes                                 +---------+---------------+---------+-----------+----------+--------------+ PTV                                                   Not visualized +---------+---------------+---------+-----------+----------+--------------+ PERO                                                  Not visualized +---------+---------------+---------+-----------+----------+--------------+   +---------+---------------+---------+-----------+----------+--------------+ LEFT     CompressibilityPhasicitySpontaneityPropertiesThrombus Aging +---------+---------------+---------+-----------+----------+--------------+ CFV      Full           Yes      Yes                                  +---------+---------------+---------+-----------+----------+--------------+ SFJ      Full                                                        +---------+---------------+---------+-----------+----------+--------------+ FV Prox  Full                                                        +---------+---------------+---------+-----------+----------+--------------+ FV Mid   Full                                                        +---------+---------------+---------+-----------+----------+--------------+ FV DistalFull                                                        +---------+---------------+---------+-----------+----------+--------------+ PFV      Full                                                        +---------+---------------+---------+-----------+----------+--------------+ POP      Full           Yes      Yes                                 +---------+---------------+---------+-----------+----------+--------------+ PTV  Not visualized +---------+---------------+---------+-----------+----------+--------------+ PERO                                                  Not visualized +---------+---------------+---------+-----------+----------+--------------+     Summary: RIGHT: - Findings appear essentially unchanged compared to previous examination. - There is no evidence of deep vein thrombosis in the lower extremity. However, portions of this examination were limited- see technologist comments above.  LEFT: - Findings appear essentially unchanged compared to previous examination. - There is no evidence of deep vein thrombosis in the lower extremity. However, portions of this examination were limited- see technologist comments above.  *See table(s) above for measurements and observations. Electronically signed by Lemar Livings MD on 02/16/2020 at 11:04:34 AM.    Final       Assessment and Plan:     ICD-10-CM   1. Occipital stroke (HCC)  I63.9   2. Need for influenza vaccination  Z23 Flu Vaccine QUAD 6+ mos PF IM (Fluarix Quad PF)  3. Decreased vision of right eye  H54.61   4. Ischemic stroke (HCC), occipital  I63.9   5. 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 Morbid obesity (HCC)  E66.01   7. TOBACCO ABUSE  F17.200   8. Essential hypertension  I10   9. Hyperlipidemia LDL goal <70  E78.5   10. Circulation disorder of lower extremity  I99.9   11. Diabetic polyneuropathy associated with type 2 diabetes mellitus (HCC)  E11.42   12. Aortic calcification (HCC)  I70.0   13. Carotid artery calcification, bilateral  I65.23    Extremely complicated medical case in a patient with an occipital stroke, decreased vision of the right side, as well as some difficulty word finding on my assessment.  Ongoing diabetes, hypertension, hyperlipidemia, morbid obesity, Body mass index is 48.03 kg/m.  Long-term smoker, his goal is to quit smoking now but he is still smoking a few cigarettes a day.  Worsening depression.  Multiple psychoactive medications currently, and he would also like to taper off of gabapentin and start Lyrica.  At this point I do not think it is the best idea to alter all of the psychoactive medications at the same time.  Titrate off of gabapentin, then start Lyrica, reassess potential for alteration of SSRI at next follow-up.  Inpatient team did want a loop recorder, and per the notes they did contact cardiology.  He already has a appointment set up with Dr. Johney Frame in the next couple of weeks.  Patient Instructions  DO NOT CHANGE YOUR PAXIL DOSE   Gabapentin - you have been taking 1,200 mg each morning and night (4 tablets)  Today:  Take 3 tablets, 900 mg at night.  Tomorrow: Take 3 tablets (900 mg) in the morning and 900 mg (3 tablets) in the evening.  In 3 days: take 2 tablets (600 mg) in the morning and 900 mg (3  tablets in the evening)  3 days after that, decrease dose:  2 tablets (600 mg) in the morning and 2 tablets (600 mg) in the evening.  3 days after that, decrease dose: 1 tablet (300 mg) in the morning and 2 tablets (600 mg) in the evening.  3 days after that, decrease dose: 1 tablet (300 mg) in the morning and 1 tablet (300 mg in the evening.  3 days  after that, decrease dose to no tablets in the morning and 1 tablet (300 mg at night)  Then stop after 3 days and you can start LYRICA.  LYRICA 50 MG, START 75 MG (1 CAPSULE) 2   TIMES A DAY    Meds ordered this encounter  Medications  . pregabalin (LYRICA) 75 MG capsule    Sig: Take 1 capsule (75 mg total) by mouth 2 (two) times daily.    Dispense:  60 capsule    Refill:  1   Medications Discontinued During This Encounter  Medication Reason  . aspirin-acetaminophen-caffeine (EXCEDRIN MIGRAINE) 250-250-65 MG tablet Patient Preference   Orders Placed This Encounter  Procedures  . Flu Vaccine QUAD 6+ mos PF IM (Fluarix Quad PF)    Follow-up: Return in about 2 months (around 04/21/2020).  Signed,  Elpidio Galea. Lawrnce Reyez, MD   Outpatient Encounter Medications as of 02/20/2020  Medication Sig  . acetaminophen (TYLENOL) 500 MG tablet Take 1,000 mg by mouth 2 (two) times daily as needed (for pain).  Marland Kitchen aspirin EC 81 MG EC tablet Take 1 tablet (81 mg total) by mouth daily. Swallow whole.  . bromocriptine (PARLODEL) 2.5 MG tablet Take 0.5 tablets (1.25 mg total) by mouth at bedtime.  . clopidogrel (PLAVIX) 75 MG tablet Take 1 tablet (75 mg total) by mouth daily for 21 days. FOR 3 WEEKS ONLY  . colesevelam (WELCHOL) 625 MG tablet Take 2 tablets (1,250 mg total) by mouth daily. (Patient taking differently: Take 625 mg by mouth 2 (two) times daily with a meal. )  . dapagliflozin propanediol (FARXIGA) 10 MG TABS tablet Take 10 mg by mouth daily before breakfast.  . Dulaglutide (TRULICITY) 4.5 MG/0.5ML SOPN Inject 4.5 mg into the skin once a  week. (Patient taking differently: Inject 4.5 mg into the skin every Saturday. )  . EPINEPHrine 0.3 mg/0.3 mL IJ SOAJ injection INJECT 0.3 ML INTO THE  MUSCLE ONCE FOR 1 DOSE. (Patient taking differently: Inject 0.3 mg into the muscle as needed for anaphylaxis. )  . gabapentin (NEURONTIN) 300 MG capsule TAKE 3 CAPSULES BY MOUTH 3  TIMES DAILY (Patient taking differently: Take 1,200 mg by mouth 2 (two) times daily with a meal. )  . glipiZIDE (GLUCOTROL XL) 10 MG 24 hr tablet TAKE 2 TABLETS BY MOUTH  DAILY WITH BREAKFAST (Patient taking differently: Take 20 mg by mouth daily with breakfast. )  . glucose blood (CONTOUR NEXT TEST) test strip Use to check blood sugar two times a day. (Patient taking differently: 1 each by Other route 2 (two) times daily. E11.9)  . Lancets Micro Thin 33G MISC Use to check blood sugar two times daily. (Patient taking differently: 1 each by Other route 2 (two) times daily. E11.9)  . lisinopril-hydrochlorothiazide (ZESTORETIC) 20-12.5 MG tablet Take 1 tablet by mouth daily.  . meloxicam (MOBIC) 15 MG tablet TAKE 1 TABLET BY MOUTH IN  THE MORNING (Patient taking differently: Take 15 mg by mouth daily. )  . metFORMIN (GLUCOPHAGE-XR) 500 MG 24 hr tablet TAKE 4 TABLETS BY MOUTH  DAILY WITH BREAKFAST (Patient taking differently: Take 1,000 mg by mouth 2 (two) times daily. )  . Multiple Vitamin (MULTIVITAMIN) tablet Take 1 tablet by mouth daily.    . pantoprazole (PROTONIX) 40 MG tablet TAKE 1 TABLET BY MOUTH  DAILY (Patient taking differently: Take 40 mg by mouth daily before breakfast. )  . PARoxetine (PAXIL) 30 MG tablet TAKE 1 TABLET BY MOUTH IN  THE MORNING (Patient taking differently: Take  30 mg by mouth in the morning. )  . pentoxifylline (TRENTAL) 400 MG CR tablet TAKE 1 TABLET BY MOUTH 3  TIMES DAILY WITH MEALS (Patient taking differently: Take 400 mg by mouth 3 (three) times daily with meals. )  . rOPINIRole (REQUIP) 0.5 MG tablet TAKE 1 TABLET BY MOUTH  TWICE DAILY (Patient  taking differently: Take 0.5 mg by mouth in the morning and at bedtime. )  . rosuvastatin (CRESTOR) 20 MG tablet Take 1 tablet (20 mg total) by mouth at bedtime.  . sildenafil (VIAGRA) 100 MG tablet Take 1 tablet (100 mg total) by mouth daily as needed for erectile dysfunction.  . pregabalin (LYRICA) 75 MG capsule Take 1 capsule (75 mg total) by mouth 2 (two) times daily.  . [DISCONTINUED] aspirin-acetaminophen-caffeine (EXCEDRIN MIGRAINE) 250-250-65 MG tablet Take 2 tablets by mouth every 6 (six) hours as needed for headache or migraine.   No facility-administered encounter medications on file as of 02/20/2020.

## 2020-02-22 ENCOUNTER — Encounter: Payer: Self-pay | Admitting: Family Medicine

## 2020-02-22 DIAGNOSIS — I6523 Occlusion and stenosis of bilateral carotid arteries: Secondary | ICD-10-CM | POA: Insufficient documentation

## 2020-02-22 DIAGNOSIS — I700 Atherosclerosis of aorta: Secondary | ICD-10-CM | POA: Insufficient documentation

## 2020-02-25 ENCOUNTER — Ambulatory Visit: Payer: Managed Care, Other (non HMO)

## 2020-02-25 ENCOUNTER — Other Ambulatory Visit: Payer: Self-pay

## 2020-02-25 ENCOUNTER — Encounter: Payer: Self-pay | Admitting: Family Medicine

## 2020-02-25 DIAGNOSIS — R2681 Unsteadiness on feet: Secondary | ICD-10-CM | POA: Diagnosis present

## 2020-02-25 DIAGNOSIS — R4701 Aphasia: Secondary | ICD-10-CM | POA: Diagnosis present

## 2020-02-25 DIAGNOSIS — R41841 Cognitive communication deficit: Secondary | ICD-10-CM | POA: Diagnosis present

## 2020-02-25 NOTE — Therapy (Signed)
[facility] [address] [place], [place], [address] Phone: [phone]   Fax:  [phone]  Physical Therapy Evaluation  Patient Details  Name: [name] [name] MRN: [id number] Date of Birth: Feb 11, 1961 Referring Provider (PT): Dr. Dallas Schimke   Encounter Date: 02/25/2020   PT End of Session - 02/25/20 1939    Visit Number 1    Number of Visits 9    Date for PT Re-Evaluation 04/21/20    Authorization Type Medicare    Progress Note Due on Visit 9    PT Start Time 1315    PT Stop Time 1400    PT Time Calculation (min) 45 min    Equipment Utilized During Treatment Gait belt    Activity Tolerance Patient tolerated treatment well    Behavior During Therapy WFL for tasks assessed/performed           Past Medical History:  Diagnosis Date   Angio-edema    Anxiety    Depression    Diabetes mellitus type II    type 2   GERD (gastroesophageal reflux disease)    Headache    migraine   History of kidney stones    HLD (hyperlipidemia)    HTN (hypertension)    Morbid obesity (HCC)    Restless legs syndrome (RLS) 11/20/2012   Tobacco abuse    Ulcer of left ankle (HCC)    last 2 months dime size dry dressing changing q day   Urticaria     Past Surgical History:  Procedure Laterality Date   Arm surgery Right 1969   Abscess excision and debridement at elbow   COLONOSCOPY WITH PROPOFOL N/A 09/06/2016   Procedure: COLONOSCOPY WITH PROPOFOL;  Surgeon: Sherrilyn Rist, MD;  Location: [facility];  Service: Gastroenterology;  Laterality: N/A;   I & D EXTREMITY Left 05/29/2014   Procedure: IRRIGATION AND DEBRIDEMENT OF LEFT LEG WOUND AND PLACEMENT OF  INTEGRA  AND  VAC;  Surgeon: Wayland Denis, DO;  Location: [facility];  Service: Plastics;  Laterality: Left;   INCISION AND DRAINAGE OF WOUND Left 04/23/2014   Procedure: IRRIGATION AND DEBRIDEMENT LOWER LEFT LEG WOUND WITH PLACEMENT OF  INTEGRA AND VAC;  Surgeon: Wayland Denis, DO;  Location: [facility];  Service: Plastics;  Laterality: Left;   kidney stone removal  2004   MINOR APPLICATION OF WOUND VAC Left 04/23/2014   Procedure: MINOR APPLICATION OF WOUND VAC;  Surgeon: Wayland Denis, DO;  Location: [facility];  Service: Plastics;  Laterality: Left;   SKIN SPLIT GRAFT Left 06/18/2014   Procedure: SKIN GRAFT SPLIT THICKNESS TO LOWER LEFT LEG WOUND WITH PLACEMENT OF VAC;  Surgeon: Wayland Denis, DO;  Location:  SURGERY CENTER;  Service: Plastics;  Laterality: Left;   VASECTOMY  1994    There were no vitals filed for this visit.    Subjective Assessment - 02/25/20 1919    Subjective Pt was in ED on  02/14/20 due to having a severe headache and vision loss in R eye. CT head showed Hyperdense right MCA.  CTA head and neck and perfusion study showed 10 cc region of completed infarction in left occipital cortex and additional 11 cc at risk/penumbra. [name] [name] (wife) reports that short term memory is affected. Pt present with wife, [name] [name]. Wife reports that he was having difficulty with remembering DOB, address, phone numbers etc. They have been working on that stuff with him at home. He intermittently gets headaches and  they go away. If it doesn't go away in 1 hour, he takes Tylenol and it goes away. Pt is not using any AD currently. He feels anxious when walking in unfamiliar environment and may need to hold wife's hand.    Patient is accompained by: Family member   Wife, [name] [name]   Pertinent History hypertension, hyperlipidemia, type 2 diabetes mellitus, restless leg syndrome, diabetic neuropathy, morbid obesity, tobacco abuse, depression/anxiety, GERD, chronic venous stasis ulcer in bilateral lower extremities    Limitations Reading;Walking;House hold activities    How long can you sit comfortably? no issues    How long can you stand comfortably? 10 min    How long can you walk comfortably? 5 min     Diagnostic tests CT head showed Hyperdense right MCA.  CTA head and neck and perfusion study showed 10 cc region of completed infarction in left occipital cortex and additional 11 cc at risk/penumbra. [name] BattenKim (wife) reports that short term memory is affected.    Patient Stated Goals improve walking    Currently in Pain? No/denies    Pain Score 3     Pain Location Head    Pain Orientation Left    Pain Descriptors / Indicators Dull    Pain Type Neuropathic pain    Pain Frequency Intermittent              [facility] PT Assessment - 02/25/20 1923      Assessment   Medical Diagnosis CVA    Referring Provider (PT) Dr. Dallas [place]    Onset Date/Surgical Date 02/14/20    Prior Therapy inpatient      Precautions   Precautions Fall      Restrictions   Weight Bearing Restrictions No      Balance Screen   Has the patient fallen in the past 6 months No      Home Environment   Living Environment Private residence    Living Arrangements Spouse/significant other    Available Help at Discharge Family    Type of Home House      Prior Function   Level of Independence Independent      Cognition   Overall Cognitive Status Impaired/Different from baseline    Memory Impaired    Memory Impairment Decreased short term memory      Sensation   Light Touch Impaired Detail   Impaired in bil lower LE     ROM / Strength   AROM / PROM / Strength Strength      Strength   Strength Assessment Site Hip;Knee;Ankle    Right/Left Hip Right;Left    Right Hip Flexion 5/5    Right Hip ABduction 5/5    Right Hip ADduction 5/5    Left Hip Flexion 5/5    Left Hip ABduction 5/5    Left Hip ADduction 5/5    Right/Left Knee Right;Left    Right Knee Flexion 5/5    Right Knee Extension 5/5    Left Knee Flexion 5/5    Left Knee Extension 5/5    Right/Left Ankle Right;Left    Right Ankle Dorsiflexion 5/5    Right Ankle Plantar Flexion 5/5    Left Ankle Dorsiflexion 5/5    Left Ankle Plantar Flexion 5/5       Standardized Balance Assessment   Standardized Balance Assessment Five Times Sit to Stand    Five times sit to stand comments  15 sec without HHA      High Level Balance   High Level  Balance Comments Modified CTSIB: all 4 test passed 30 sec                      Objective measurements completed on examination: See above findings.     treatment Gait training: 1 x 115' with st. Cane, 1 x 115' with rollator, cues with cane with 2 point gait pattern Educated wife on adjusting the cane for his appropriate height Patient education: patient and wife educated that using a straight cane or rollator may be appropriate at this time to provide some stability with his walking so he can improve his walking endurance. Pt and wife educated on gradually increasing his walking ffrom 5 min to 30 min as tolerated.             PT Short Term Goals - 02/25/20 1933      PT SHORT TERM GOAL #1   Title patient will be able to walk with or without AD for 10 min to improve walking endurance    Baseline <5 min without AD    Time 4    Period Weeks    Status New    Target Date 03/24/20             PT Long Term Goals - 02/25/20 1933      PT LONG TERM GOAL #1   Title Pt will be able to ambulate for 30 min with or without AD to improve walking endurance    Baseline <5 min without AD    Time 8    Period Weeks    Status New    Target Date 04/21/20      PT LONG TERM GOAL #2   Title Pt will demo 12 sec or less with 5x/ sit to stand without HHA to improve functional strength    Baseline 15 sec without HHA    Time 8    Period Weeks    Status New    Target Date 04/21/20                  Plan - 02/25/20 1934    Clinical Impression Statement Patient is a 59 y.o. male who was seen today for physical therapy evaluation and treatment for gait and balance disorder following recent CVA. Patient currently demonstrates [facility] of functional strength and balance. Patient is demonstrating  decreased confidence with overall walking and functional activities with increased anxiety levels after his recent stroke. patient will benefit from skilled PT to gradually improve his confidence with walking and improve his ability to walk longer distances and improve functional strength and endurance to return to PLOF.    Personal Factors and Comorbidities Comorbidity 3+;Time since onset of injury/illness/exacerbation    Comorbidities hypertension, hyperlipidemia, type 2 diabetes mellitus, restless leg syndrome, diabetic neuropathy, morbid obesity, tobacco abuse, depression/anxiety, GERD, chronic venous stasis ulcer in bilateral lower extremities    Examination-Activity Limitations Squat;Stand;Stairs    Examination-Participation Restrictions Community Activity;Yard Work;Shop    Clinical Decision Making Moderate    Rehab Potential Good    PT Frequency 1x / week    PT Duration 8 weeks    PT Treatment/Interventions ADLs/Self Care Home Management;Gait training;Stair training;Functional mobility training;Therapeutic activities;Therapeutic exercise;Balance training;Neuromuscular re-education;Patient/family education;Manual techniques;Passive range of motion;Energy conservation;Joint Manipulations    PT Next Visit Plan Issue HEP consisting of balance exercises and sit to stand    PT Home Exercise Plan Walking program: walking with family member for 5 min and gradually progressing it to 30 min  Consulted and Agree with Plan of Care Patient;Family member/caregiver    Family Member Consulted Wife [name] [name])           Patient will benefit from skilled therapeutic intervention in order to improve the following deficits and impairments:  Abnormal gait, Decreased activity tolerance, Decreased mobility, Decreased endurance, Difficulty walking, Impaired sensation, Impaired vision/preception, Postural dysfunction  Visit Diagnosis: Unsteadiness on feet     Problem List Patient Active Problem List    Diagnosis Date Noted   Aortic calcification (HCC) 02/22/2020   Carotid artery calcification, bilateral 02/22/2020   Depression    GERD (gastroesophageal reflux disease)    Ischemic stroke (HCC), occipital    Diabetic polyneuropathy associated with type 2 diabetes mellitus (HCC) 06/30/2018   Other chronic pain 06/30/2018   Erectile dysfunction due to type 2 diabetes mellitus (HCC) 06/30/2018   Circulation disorder of lower extremity 05/31/2015   IBS (irritable bowel syndrome) 12/24/2014   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 [place], [place], [address] Phone: [phone]   Fax:  [phone]  Name: Deundre Thong MRN: [id number] Date of Birth: 11/02/1960

## 2020-02-26 ENCOUNTER — Telehealth: Payer: Self-pay | Admitting: Family Medicine

## 2020-02-26 NOTE — Telephone Encounter (Signed)
Ready, on your desk

## 2020-02-26 NOTE — Telephone Encounter (Signed)
Pt called checking on paperwork She stated she will turn in paperwork her do not fax

## 2020-02-26 NOTE — Telephone Encounter (Signed)
[facility] paperwork in dr copland's in box

## 2020-02-26 NOTE — Telephone Encounter (Signed)
Recent Results (from the past 2160 hour(s))  POCT HgB A1C     Status: Abnormal   Collection Time: 01/17/20  1:41 PM  Result Value Ref Range   Hemoglobin A1C 8.1 (A) 4.0 - 5.6 %   HbA1c POC (<> result, manual entry)     HbA1c, POC (prediabetic range)     HbA1c, POC (controlled diabetic range)    Urine rapid drug screen (hosp performed)     Status: None   Collection Time: 02/14/20 11:41 AM  Result Value Ref Range   Opiates NONE DETECTED NONE DETECTED   Cocaine NONE DETECTED NONE DETECTED   Benzodiazepines NONE DETECTED NONE DETECTED   Amphetamines NONE DETECTED NONE DETECTED   Tetrahydrocannabinol NONE DETECTED NONE DETECTED   Barbiturates NONE DETECTED NONE DETECTED    Comment: (NOTE) DRUG SCREEN FOR MEDICAL PURPOSES ONLY.  IF CONFIRMATION IS NEEDED FOR ANY PURPOSE, NOTIFY LAB WITHIN 5 DAYS.  LOWEST DETECTABLE LIMITS FOR URINE DRUG SCREEN Drug Class                     Cutoff (ng/mL) Amphetamine and metabolites    1000 Barbiturate and metabolites    200 Benzodiazepine                 200 Tricyclics and metabolites     300 Opiates and metabolites        300 Cocaine and metabolites        300 THC                            50 Performed at [facility], [address], [place], [place] [address]   Urinalysis, Routine w reflex microscopic Urine, Clean Catch     Status: Abnormal   Collection Time: 02/14/20 11:41 AM  Result Value Ref Range   Color, Urine STRAW (A) YELLOW   APPearance CLEAR CLEAR   Specific Gravity, Urine 1.014 1.005 - 1.030   pH 6.0 5.0 - 8.0   Glucose, UA >=500 (A) NEGATIVE mg/dL   Hgb urine dipstick NEGATIVE NEGATIVE   Bilirubin Urine NEGATIVE NEGATIVE   Ketones, ur 5 (A) NEGATIVE mg/dL   Protein, ur 30 (A) NEGATIVE mg/dL   Nitrite NEGATIVE NEGATIVE   Leukocytes,Ua NEGATIVE NEGATIVE   RBC / HPF 0-5 0 - 5 RBC/hpf   WBC, UA 0-5 0 - 5 WBC/hpf   Bacteria, UA NONE SEEN NONE SEEN    Comment: Performed at [facility], [address],  [place], [place] [address]  Ethanol     Status: None   Collection Time: 02/14/20 11:43 AM  Result Value Ref Range   Alcohol, Ethyl (B) <10 <10 mg/dL    Comment: (NOTE) Lowest detectable limit for serum alcohol is 10 mg/dL.  For medical purposes only. Performed at [facility], [address] [place], [place] [address]   Protime-INR     Status: None   Collection Time: 02/14/20 11:43 AM  Result Value Ref Range   Prothrombin Time 12.2 11.4 - 15.2 seconds   INR 0.9 0.8 - 1.2    Comment: (NOTE) INR goal varies based on device and disease states. Performed at [facility], [address], [place], [place] [address]   APTT     Status: None   Collection Time: 02/14/20 11:43 AM  Result Value Ref Range   aPTT 28 24 - 36 seconds    Comment: Performed  at [facility], [address] [place], [place] [address]  CBC     Status: Abnormal   Collection Time: 02/14/20 11:43 AM  Result Value Ref Range   WBC 9.9 4.0 - 10.5 K/uL   RBC 6.00 (H) 4.22 - 5.81 MIL/uL   Hemoglobin 16.9 13.0 - 17.0 g/dL   HCT 60.451.7 39 - 52 %   MCV 86.2 80.0 - 100.0 fL   MCH 28.2 26.0 - 34.0 pg   MCHC 32.7 30.0 - 36.0 g/dL   RDW 54.014.5 98.111.5 - 19.115.5 %   Platelets 209 150 - 400 K/uL   nRBC 0.0 0.0 - 0.2 %    Comment: Performed at [facility], [address] [place], [place] [address]  Differential     Status: None   Collection Time: 02/14/20 11:43 AM  Result Value Ref Range   Neutrophils Relative % 63 %   Neutro Abs 6.2 1.7 - 7.7 K/uL   Lymphocytes Relative 23 %   Lymphs Abs 2.3 0.7 - 4.0 K/uL   Monocytes Relative 10 %   Monocytes Absolute 1.0 0 - 1 K/uL   Eosinophils Relative 3 %   Eosinophils Absolute 0.3 0 - 0 K/uL   Basophils Relative 1 %   Basophils Absolute 0.1 0 - 0 K/uL   Immature Granulocytes 0 %   Abs Immature Granulocytes 0.04 0.00 - 0.07 K/uL    Comment: Performed at [facility], [address] [place], [place] [address]  Comprehensive metabolic panel     Status:  Abnormal   Collection Time: 02/14/20 11:43 AM  Result Value Ref Range   Sodium 139 135 - 145 mmol/L   Potassium 4.1 3.5 - 5.1 mmol/L   Chloride 103 98 - 111 mmol/L   CO2 22 22 - 32 mmol/L   Glucose, Bld 183 (H) 70 - 99 mg/dL    Comment: Glucose reference range applies only to samples taken after fasting for at least 8 hours.   BUN 17 6 - 20 mg/dL   Creatinine, Ser 0.861.27 (H) 0.61 - 1.24 mg/dL   Calcium 9.6 8.9 - 57.810.3 mg/dL   Total Protein 7.0 6.5 - 8.1 g/dL   Albumin 4.1 3.5 - 5.0 g/dL   AST 17 15 - 41 U/L   ALT 20 0 - 44 U/L   Alkaline Phosphatase 45 38 - 126 U/L   Total Bilirubin 0.8 0.3 - 1.2 mg/dL   GFR calc non Af Amer >60 >60 mL/min   GFR calc Af Amer >60 >60 mL/min   Anion gap 14 5 - 15    Comment: Performed at [facility], [address] [place], [place] [address]  I-stat chem 8, ED     Status: Abnormal   Collection Time: 02/14/20 11:50 AM  Result Value Ref Range   Sodium 140 135 - 145 mmol/L   Potassium 4.0 3.5 - 5.1 mmol/L   Chloride 107 98 - 111 mmol/L   BUN 22 (H) 6 - 20 mg/dL   Creatinine, Ser 9.521.10 0.61 - 1.24 mg/dL   Glucose, Bld 841181 (H) 70 - 99 mg/dL    Comment: Glucose reference range applies only to samples taken after fasting for at least 8 hours.   Calcium, Ion 1.10 (L) 1.15 - 1.40 mmol/L   TCO2 21 (L) 22 - 32 mmol/L   Hemoglobin 17.3 (H) 13.0 - 17.0 g/dL   HCT 32.451.0 39 - 52 %  SARS Coronavirus 2 by RT  PCR (hospital order, performed in [facility] hospital lab) Nasopharyngeal Nasopharyngeal Swab     Status: None   Collection Time: 02/14/20 12:39 PM   Specimen: Nasopharyngeal Swab  Result Value Ref Range   SARS Coronavirus 2 NEGATIVE NEGATIVE    Comment: (NOTE) SARS-CoV-2 target nucleic acids are NOT DETECTED.  The SARS-CoV-2 RNA is generally detectable in upper and lower respiratory specimens during the acute phase of infection. The lowest concentration of SARS-CoV-2 viral copies this assay can detect is 250 copies / mL. A negative result does  not preclude SARS-CoV-2 infection and should not be used as the sole basis for treatment or other patient management decisions.  A negative result may occur with improper specimen collection / handling, submission of specimen other than nasopharyngeal swab, presence of viral mutation(s) within the areas targeted by this assay, and inadequate number of viral copies (<250 copies / mL). A negative result must be combined with clinical observations, patient history, and epidemiological information.  Fact Sheet for Patients:   BoilerBrush.com.cy  Fact Sheet for Healthcare Providers: https://pope.com/  This test is not yet approved or  cleared by the [place] FDA and has been authorized for detection and/or diagnosis of SARS-CoV-2 by FDA under an Emergency Use Authorization (EUA).  This EUA will remain in effect (meaning this test can be used) for the duration of the COVID-19 declaration under Section 564(b)(1) of the Act, 21 U.S.C. section 360bbb-3(b)(1), unless the authorization is terminated or revoked sooner.  Performed at [facility], [address] [place], [place] [address]   ECHOCARDIOGRAM COMPLETE     Status: None   Collection Time: 02/14/20  2:05 PM  Result Value Ref Range   Weight 3,680 oz   Height 70 in   BP 142/96 mmHg   S' Lateral 3.80 cm   Area-P 1/2 3.42 cm2  CBG monitoring, ED     Status: Abnormal   Collection Time: 02/14/20  5:19 PM  Result Value Ref Range   Glucose-Capillary 221 (H) 70 - 99 mg/dL    Comment: Glucose reference range applies only to samples taken after fasting for at least 8 hours.  Glucose, capillary     Status: Abnormal   Collection Time: 02/14/20 10:28 PM  Result Value Ref Range   Glucose-Capillary 249 (H) 70 - 99 mg/dL    Comment: Glucose reference range applies only to samples taken after fasting for at least 8 hours.  HIV Antibody (routine testing w rflx)     Status: None    Collection Time: 02/15/20  1:42 AM  Result Value Ref Range   HIV Screen 4th Generation wRfx Non Reactive Non Reactive    Comment: Performed at [facility], [address] [place], [place] [address]  Hemoglobin A1c     Status: Abnormal   Collection Time: 02/15/20  1:42 AM  Result Value Ref Range   Hgb A1c MFr Bld 8.4 (H) 4.8 - 5.6 %    Comment: (NOTE) Pre diabetes:          5.7%-6.4%  Diabetes:              >6.4%  Glycemic control for   <7.0% adults with diabetes    Mean Plasma Glucose 194.38 mg/dL    Comment: Performed at [facility], [address], [place], [place] [address]  Lipid panel     Status: Abnormal   Collection Time: 02/15/20  1:42 AM  Result Value Ref Range   Cholesterol  102 0 - 200 mg/dL   Triglycerides 485 (H) <150 mg/dL   HDL 29 (L) >46 mg/dL   Total CHOL/HDL Ratio 3.5 RATIO   VLDL 58 (H) 0 - 40 mg/dL   LDL Cholesterol 15 0 - 99 mg/dL    Comment:        Total Cholesterol/HDL:CHD Risk Coronary Heart Disease Risk Table                     Men   Women  1/2 Average Risk   3.4   3.3  Average Risk       5.0   4.4  2 X Average Risk   9.6   7.1  3 X Average Risk  23.4   11.0        Use the calculated Patient Ratio above and the CHD Risk Table to determine the patient's CHD Risk.        ATP III CLASSIFICATION (LDL):  <100     mg/dL   Optimal  270-350  mg/dL   Near or Above                    Optimal  130-159  mg/dL   Borderline  093-818  mg/dL   High  >299     mg/dL   Very High Performed at [facility], [address] [place], [place] [address]   CBC with Differential/Platelet     Status: Abnormal   Collection Time: 02/15/20  1:42 AM  Result Value Ref Range   WBC 10.7 (H) 4.0 - 10.5 K/uL   RBC 5.69 4.22 - 5.81 MIL/uL   Hemoglobin 16.1 13.0 - 17.0 g/dL   HCT 67.8 39 - 52 %   MCV 84.7 80.0 - 100.0 fL   MCH 28.3 26.0 - 34.0 pg   MCHC 33.4 30.0 - 36.0 g/dL   RDW 93.8 10.1 - 75.1 %   Platelets 212 150 - 400 K/uL   nRBC 0.0 0.0 -  0.2 %   Neutrophils Relative % 66 %   Neutro Abs 7.1 1.7 - 7.7 K/uL   Lymphocytes Relative 21 %   Lymphs Abs 2.3 0.7 - 4.0 K/uL   Monocytes Relative 10 %   Monocytes Absolute 1.1 (H) 0 - 1 K/uL   Eosinophils Relative 2 %   Eosinophils Absolute 0.2 0 - 0 K/uL   Basophils Relative 1 %   Basophils Absolute 0.1 0 - 0 K/uL   Immature Granulocytes 0 %   Abs Immature Granulocytes 0.03 0.00 - 0.07 K/uL    Comment: Performed at [facility], [address] [place], [place] [address]  Basic metabolic panel     Status: Abnormal   Collection Time: 02/15/20  1:42 AM  Result Value Ref Range   Sodium 137 135 - 145 mmol/L   Potassium 3.9 3.5 - 5.1 mmol/L   Chloride 102 98 - 111 mmol/L   CO2 26 22 - 32 mmol/L   Glucose, Bld 168 (H) 70 - 99 mg/dL    Comment: Glucose reference range applies only to samples taken after fasting for at least 8 hours.   BUN 15 6 - 20 mg/dL   Creatinine, Ser 2.77 0.61 - 1.24 mg/dL   Calcium 9.8 8.9 - 82.4 mg/dL   GFR calc non Af Amer >60 >60 mL/min   GFR calc Af Amer >60 >60 mL/min   Anion gap 9 5 - 15    Comment: Performed  at [facility], [address] [place], [place] [address]  Glucose, capillary     Status: Abnormal   Collection Time: 02/15/20  6:08 AM  Result Value Ref Range   Glucose-Capillary 213 (H) 70 - 99 mg/dL    Comment: Glucose reference range applies only to samples taken after fasting for at least 8 hours.  Glucose, capillary     Status: Abnormal   Collection Time: 02/15/20 12:42 PM  Result Value Ref Range   Glucose-Capillary 230 (H) 70 - 99 mg/dL    Comment: Glucose reference range applies only to samples taken after fasting for at least 8 hours.  Glucose, capillary     Status: Abnormal   Collection Time: 02/15/20  3:47 PM  Result Value Ref Range   Glucose-Capillary 212 (H) 70 - 99 mg/dL    Comment: Glucose reference range applies only to samples taken after fasting for at least 8 hours.

## 2020-02-27 NOTE — Telephone Encounter (Signed)
Daughter jodan dorsett will pick up paperwork per spouse

## 2020-02-27 NOTE — Telephone Encounter (Signed)
Please mark pt aware if you give him below information

## 2020-02-27 NOTE — Telephone Encounter (Signed)
Left message asking pt to call office  Paperwork ready for pick up He will need to turn in  Copy for pt Copy for scan

## 2020-03-03 ENCOUNTER — Ambulatory Visit: Payer: Managed Care, Other (non HMO)

## 2020-03-03 ENCOUNTER — Other Ambulatory Visit: Payer: Self-pay

## 2020-03-03 ENCOUNTER — Ambulatory Visit: Payer: Managed Care, Other (non HMO) | Attending: Internal Medicine | Admitting: Speech Pathology

## 2020-03-03 DIAGNOSIS — H53461 Homonymous bilateral field defects, right side: Secondary | ICD-10-CM | POA: Insufficient documentation

## 2020-03-03 DIAGNOSIS — R2689 Other abnormalities of gait and mobility: Secondary | ICD-10-CM | POA: Insufficient documentation

## 2020-03-03 DIAGNOSIS — R2681 Unsteadiness on feet: Secondary | ICD-10-CM

## 2020-03-03 DIAGNOSIS — R4701 Aphasia: Secondary | ICD-10-CM | POA: Diagnosis not present

## 2020-03-03 DIAGNOSIS — M6281 Muscle weakness (generalized): Secondary | ICD-10-CM | POA: Insufficient documentation

## 2020-03-03 DIAGNOSIS — R278 Other lack of coordination: Secondary | ICD-10-CM | POA: Diagnosis present

## 2020-03-03 DIAGNOSIS — R41841 Cognitive communication deficit: Secondary | ICD-10-CM | POA: Diagnosis present

## 2020-03-03 DIAGNOSIS — R41842 Visuospatial deficit: Secondary | ICD-10-CM | POA: Diagnosis present

## 2020-03-03 DIAGNOSIS — I69318 Other symptoms and signs involving cognitive functions following cerebral infarction: Secondary | ICD-10-CM | POA: Diagnosis present

## 2020-03-03 NOTE — Therapy (Signed)
[facility] [address] [place], [place], [address] Phone: [phone]   Fax:  [phone]  Speech Language Pathology Evaluation  Patient Details  Name: [name] [name] MRN: [id number] Date of Birth: 10-Sep-1960 Referring Provider (SLP): Osvaldo Shipper (referring); Copland, Spencer (PCP/documentation)   Encounter Date: 03/03/2020   End of Session - 03/03/20 1725    Visit Number 1    Number of Visits 17    Date for SLP Re-Evaluation 05/02/20    SLP Start Time 1447    SLP Stop Time  1538    SLP Time Calculation (min) 51 min    Activity Tolerance Patient tolerated treatment well           Past Medical History:  Diagnosis Date  . Angio-edema   . Anxiety   . Depression   . Diabetes mellitus type II    type 2  . GERD (gastroesophageal reflux disease)   . Headache    migraine  . History of kidney stones   . HLD (hyperlipidemia)   . HTN (hypertension)   . Morbid obesity (HCC)   . Restless legs syndrome (RLS) 11/20/2012  . Tobacco abuse   . Ulcer of left ankle (HCC)    last 2 months dime size dry dressing changing q day  . Urticaria     Past Surgical History:  Procedure Laterality Date  . Arm surgery Right 1969   Abscess excision and debridement at elbow  . COLONOSCOPY WITH PROPOFOL N/A 09/06/2016   Procedure: COLONOSCOPY WITH PROPOFOL;  Surgeon: Sherrilyn Rist, MD;  Location: [facility];  Service: Gastroenterology;  Laterality: N/A;  . I & D EXTREMITY Left 05/29/2014   Procedure: IRRIGATION AND DEBRIDEMENT OF LEFT LEG WOUND AND PLACEMENT OF  INTEGRA  AND  VAC;  Surgeon: Wayland Denis, DO;  Location: [facility];  Service: Plastics;  Laterality: Left;  . INCISION AND DRAINAGE OF WOUND Left 04/23/2014   Procedure: IRRIGATION AND DEBRIDEMENT LOWER LEFT LEG WOUND WITH PLACEMENT OF INTEGRA AND VAC;  Surgeon: Wayland Denis, DO;  Location: [facility];  Service: Plastics;  Laterality:  Left;  . kidney stone removal  2004  . MINOR APPLICATION OF WOUND VAC Left 04/23/2014   Procedure: MINOR APPLICATION OF WOUND VAC;  Surgeon: Wayland Denis, DO;  Location: [facility];  Service: Plastics;  Laterality: Left;  . SKIN SPLIT GRAFT Left 06/18/2014   Procedure: SKIN GRAFT SPLIT THICKNESS TO LOWER LEFT LEG WOUND WITH PLACEMENT OF VAC;  Surgeon: Wayland Denis, DO;  Location: [facility];  Service: Plastics;  Laterality: Left;  Marland Kitchen VASECTOMY  1994    There were no vitals filed for this visit.   Subjective Assessment - 03/03/20 1450    Subjective "Answering a random question, trying to read," (re: what's difficult about communication)    Patient is accompained by: Family member   wife, [name]   Currently in Pain? No/denies              SLP Evaluation [facility] - 03/03/20 1450      SLP Visit Information   SLP Received On 03/03/20    Referring Provider (SLP) Osvaldo Shipper (referring); Copland, Spencer (PCP/documentation)    Onset Date 02-14-20    Medical Diagnosis L CVA      General Information   HPI [name] [name] is a 59 y.o. male with medical history significant of hypertension, hyperlipidemia, type 2 diabetes mellitus, restless leg syndrome, diabetic neuropathy, morbid obesity,  tobacco abuse, depression/anxiety, GERD, chronic venous stasis ulcer in bilateral lower extremities who presented to [facility] on 02/14/20. CT of the head on 02/15/20 showed developing hypodensity in the left occipital parietal lobe compatible with acute infarct. SLP evaluated on acute with findings of expressive and receptive aphasia, dyslexia/alexia.    Behavioral/Cognition alert, cooperative, anxious    Mobility Status ambulated to session      Balance Screen   Has the patient fallen in the past 6 months --   on PT caseload     Prior Functional Status   Cognitive/Linguistic Baseline Within functional limits    Type of Home House     Lives With Spouse    Available  Support Family    Vocation Full time employment   auditor for [employer]     Pain Assessment   Pain Assessment No/denies pain      Cognition   Overall Cognitive Status Impaired/Different from baseline   not formally assessed yet; per wife, "problem solving"     Auditory Comprehension   Overall Auditory Comprehension Impaired    Yes/No Questions Within Functional Limits    Commands Impaired    Multistep Basic Commands 75-100% accurate    Complex Commands 50-74% accurate    Conversation Moderately complex    Other Conversation Comments needs repetition, slow rate, processing is slow    Interfering Components Processing speed    EffectiveTechniques Extra processing time;Repetition;Visual/Gestural cues      Visual Recognition/Discrimination   Discrimination Within Function Limits      Reading Comprehension   Reading Status Not tested    Interfering Components Visual scanning   will assess further     Expression   Primary Mode of Expression Verbal      Verbal Expression   Overall Verbal Expression Impaired    Initiation No impairment    Automatic Speech Name;Social Response    Level of Generative/Spontaneous Verbalization Conversation    Repetition Impaired    Level of Impairment Sentence level   96/100   Naming Impairment    Responsive 76-100% accurate   10/10   Confrontation 75-100% accurate   19/20   Divergent 75-100% accurate   13 animals in 60 seconds   Other Naming Comments "seaboat" vs "sailboat", hesitations, circumlocution in extended speech    Verbal Errors Semantic paraphasias;Phonemic paraphasias;Aware of errors;Not aware of errors    Pragmatics No impairment    Effective Techniques Sentence completion;Phonemic cues;Open ended questions      Written Expression   Dominant Hand Right    Written Expression Not tested   wife reports difficulties; will assess further     Oral Motor/Sensory Function   Overall Oral Motor/Sensory Function Appears within functional  limits for tasks assessed      Motor Speech   Overall Motor Speech Appears within functional limits for tasks assessed    Respiration Within functional limits      Standardized Assessments   Standardized Assessments  Western Aphasia Battery revised    Western Aphasia Battery revised  Mild anomic aphasia; aphasia quotient 92.3 (76+ is mild). Spontaneous speech: 18/20, Auditory verbal comprehension 9.55/10, Repetition 9.6/10, Naming 9.0/10                           SLP Education - 03/03/20 1725    Education Details proposed therapy goals, will assess reading/writing/cognition further    Person(s) Educated Patient;Spouse    Methods Explanation    Comprehension Verbalized understanding  SLP Short Term Goals - 03/03/20 1727      SLP SHORT TERM GOAL #1   Title Patient will participate functionally in 8 minutes simple-mod complex conversation using compensations for anomia as necessary, x3 visits.    Time 4    Period Weeks    Status New      SLP SHORT TERM GOAL #2   Title Pt will demonstrate auditory comprehension of 5 minutes simple-mod complex material by answering [facility] questions >90% accuracy x 2 sessions.    Time 4    Period Weeks    Status New      SLP SHORT TERM GOAL #3   Title Pt will participate in assessment of reading, writing and cognition with goals added PRN    Time 4    Period Weeks    Status New            SLP Long Term Goals - 03/03/20 1730      SLP LONG TERM GOAL #1   Title Patient will participate functionally in 15 minutes mod complex-complex conversation using compensations as necessary x 3 visits.    Time 8    Period Weeks    Status New      SLP LONG TERM GOAL #2   Title Patient will demonstrate auditory comprehension of 15 minutes mod complex content (conversation, podcast, radio program, etc) x 3 visits by answering detailed questions >90% accuracy.    Time 8    Period Weeks    Status New      SLP LONG TERM GOAL #3    Title Patient will report using accessibility features to read and reply to text messages x 3 visits.    Time 8    Period Weeks    Status New            Plan - 03/03/20 1726    Clinical Impression Statement [name] Sargeant presents with mild anomic aphasia per Western Aphasia Battery-Revised; suspect additional deficits in written expression and reading comprehension, as well as mild cognitive deficits to be assessed in upcoming sessions. Pt and wife report he does quite well with simple conversation, but he has difficulty answering "more specific" questions. He is somewhat anxious, which he has noticed affects his communication abilities. Processing appears slower, and SLP noted repetition, simple rephrasing, and slower speech were helpful for pt's understanding. 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 Continues to report frustration with communication abilities. Speech is fluent, characterized by hesitations, circumlocution, occasional paraphasias; patient is aware ~75% of the time. I recommend skilled ST to maximize communication and cognitive abilities to reduce frustration, improve language abilities, safety, and for possible return to work.    Speech Therapy Frequency 2x / week    Duration --   8 weeks or 17 visits   Treatment/Interventions Language facilitation;Environmental controls;Cueing hierarchy;SLP instruction and feedback;Cognitive reorganization;Compensatory techniques;Functional tasks;Compensatory strategies;Internal/external aids;Multimodal communcation approach;Patient/family education    Potential to Achieve Goals Good    SLP Home Exercise Plan provided aphasia handout and info re: Constant Therapy           Patient will benefit from skilled therapeutic  intervention in order to improve the following deficits and impairments:   Aphasia  Cognitive communication deficit    Problem  List Patient Active Problem List   Diagnosis Date Noted  . Aortic calcification (HCC) 02/22/2020  . Carotid artery calcification, bilateral 02/22/2020  . Depression   . GERD (gastroesophageal reflux disease)   . Ischemic stroke (HCC), occipital   . Diabetic polyneuropathy associated with type 2 diabetes mellitus (HCC) 06/30/2018  . Other chronic pain 06/30/2018  . Erectile dysfunction due to type 2 diabetes mellitus (HCC) 06/30/2018  . Circulation disorder of lower extremity 05/31/2015  . IBS (irritable bowel syndrome) 12/24/2014  . 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 Diabetic leg ulcer (HCC) 07/18/2013  . Restless legs syndrome (RLS) 11/20/2012  . Generalized anxiety disorder   . Type 2 diabetes mellitus with vascular disease (HCC)   . Hyperlipidemia LDL goal <70 01/16/2009  . TOBACCO ABUSE 07/27/2007  . Morbid obesity (HCC) 09/29/2006  . Essential hypertension 09/29/2006  . CALCULUS, KIDNEY 05/30/2002   Rondel BatonMary Beth Maddi Collar, MS, CCC-SLP Speech-Language Pathologist  Arlana LindauMary E Florie Carico 03/03/2020, 5:41 PM  [facility] [address] [place], [place], [address] Phone: [phone]   Fax:  [phone]  Name: Joen LauraHerbert Hollopeter MRN: [id number] Date of Birth: [birth year]

## 2020-03-03 NOTE — Patient Instructions (Signed)
Access Code: [email or web address] URL: [email or web address] Date: 03/03/2020 Prepared by: Jethro Bastos  Exercises Standing Balance with Eyes Closed on Foam - 1 x daily - 5 x weekly - 1 sets - 3 reps - 30 hold Feet Apart with Head Nods on Foam Pad - 1 x daily - 5 x weekly - 2 sets - 10 reps Feet Apart on Foam Pad with Head Rotation - 1 x daily - 5 x weekly - 2 sets - 10 reps Tandem Stance - 1 x daily - 5 x weekly - 1 sets - 3 reps - 30 hold

## 2020-03-03 NOTE — Therapy (Signed)
[facility] [address] [place], [place], [address] Phone: [phone]   Fax:  [phone]  Physical Therapy Treatment  Patient Details  Name: [name] [name] MRN: [id number] Date of Birth: May 14, 1961 Referring Provider (PT): Dr. Dallas Schimke   Encounter Date: 03/03/2020   PT End of Session - 03/03/20 1448    Visit Number 2    Number of Visits 9    Date for PT Re-Evaluation 04/21/20    Authorization Type Medicare    Progress Note Due on Visit 9    PT Start Time 1401    PT Stop Time 1445    PT Time Calculation (min) 44 min    Equipment Utilized During Treatment Gait belt    Activity Tolerance Patient tolerated treatment well    Behavior During Therapy [facility] for tasks assessed/performed           Past Medical History:  Diagnosis Date  . Angio-edema   . Anxiety   . Depression   . Diabetes mellitus type II    type 2  . GERD (gastroesophageal reflux disease)   . Headache    migraine  . History of kidney stones   . HLD (hyperlipidemia)   . HTN (hypertension)   . Morbid obesity (HCC)   . Restless legs syndrome (RLS) 11/20/2012  . Tobacco abuse   . Ulcer of left ankle (HCC)    last 2 months dime size dry dressing changing q day  . Urticaria     Past Surgical History:  Procedure Laterality Date  . Arm surgery Right 1969   Abscess excision and debridement at elbow  . COLONOSCOPY WITH PROPOFOL N/A 09/06/2016   Procedure: COLONOSCOPY WITH PROPOFOL;  Surgeon: Sherrilyn Rist, MD;  Location: [facility];  Service: Gastroenterology;  Laterality: N/A;  . I & D EXTREMITY Left 05/29/2014   Procedure: IRRIGATION AND DEBRIDEMENT OF LEFT LEG WOUND AND PLACEMENT OF  INTEGRA  AND  VAC;  Surgeon: Wayland Denis, DO;  Location: [facility];  Service: Plastics;  Laterality: Left;  . INCISION AND DRAINAGE OF WOUND Left 04/23/2014   Procedure: IRRIGATION AND DEBRIDEMENT LOWER LEFT LEG WOUND WITH PLACEMENT OF  INTEGRA AND VAC;  Surgeon: Wayland Denis, DO;  Location: [facility];  Service: Plastics;  Laterality: Left;  . kidney stone removal  2004  . MINOR APPLICATION OF WOUND VAC Left 04/23/2014   Procedure: MINOR APPLICATION OF WOUND VAC;  Surgeon: Wayland Denis, DO;  Location: [facility];  Service: Plastics;  Laterality: Left;  . SKIN SPLIT GRAFT Left 06/18/2014   Procedure: SKIN GRAFT SPLIT THICKNESS TO LOWER LEFT LEG WOUND WITH PLACEMENT OF VAC;  Surgeon: Wayland Denis, DO;  Location: [facility];  Service: Plastics;  Laterality: Left;  Marland Kitchen VASECTOMY  1994    There were no vitals filed for this visit.   Subjective Assessment - 03/03/20 1405    Subjective Patient/wife reports that he is walking daily and is gradually trying to walk longer each day. No pain, and no falls to report since last visit.    Patient is accompained by: Family member   Wife, [name]   Pertinent History hypertension, hyperlipidemia, type 2 diabetes mellitus, restless leg syndrome, diabetic neuropathy, morbid obesity, tobacco abuse, depression/anxiety, GERD, chronic venous stasis ulcer in bilateral lower extremities    Limitations Reading;Walking;House hold activities    How long can you sit comfortably? no issues    How long can you stand comfortably?  10 min    How long can you walk comfortably? 5 min    Diagnostic tests CT head showed Hyperdense right MCA.  CTA head and neck and perfusion study showed 10 cc region of completed infarction in left occipital cortex and additional 11 cc at risk/penumbra. [name] (wife) reports that short term memory is affected.    Patient Stated Goals improve walking    Currently in Pain? No/denies                             [facility] Adult PT Treatment/Exercise - 03/03/20 0001      Ambulation/Gait   Ambulation/Gait Yes    Ambulation/Gait Assistance 5: Supervision    Ambulation/Gait Assistance Details Patient ambulating in/out of session  without AD, as wife reports they forgot AD. Patient ambulating x 115 ft with SPC, and x 115 ft without AD. Patient demo good stability during session today ambulating without AD. PT continues to encourage use of SPC at this time for improved endurance.     Ambulation Distance (Feet) 115 Feet   x 2   Assistive device None;Straight cane    Gait Pattern Step-through pattern    Ambulation Surface Level;Indoor            Completed all of the following exercises and established initial HEP for balance. Patient required intermittent rest breaks due to fatigue. PT completed treatment in a private treatment room due to patient's increased anxiety throughout session. Patient require intermittent removal of mask due to SOB/anxiety throughout session.   Access Code: [email or web address] URL: [email or web address] Date: 03/03/2020 Prepared by: Jethro Bastos  Exercises Standing Balance with Eyes Closed on Foam - 1 x daily - 5 x weekly - 1 sets - 3 reps - 30 hold Feet Apart with Head Nods on Foam Pad - 1 x daily - 5 x weekly - 2 sets - 10 reps Feet Apart on Foam Pad with Head Rotation - 1 x daily - 5 x weekly - 2 sets - 10 reps Tandem Stance - 1 x daily - 5 x weekly - 1 sets - 3 reps - 30 hold       PT Education - 03/03/20 1449    Education Details Educated on Initial HEP    Person(s) Educated Patient;Spouse    Methods Explanation;Demonstration;Handout    Comprehension Verbalized understanding;Returned demonstration;Need further instruction            PT Short Term Goals - 02/25/20 1933      PT SHORT TERM GOAL #1   Title patient will be able to walk with or without AD for 10 min to improve walking endurance    Baseline <5 min without AD    Time 4    Period Weeks    Status New    Target Date 03/24/20             PT Long Term Goals - 02/25/20 1933      PT LONG TERM GOAL #1   Title Pt will be able to ambulate for 30 min with or without AD to improve walking endurance     Baseline <5 min without AD    Time 8    Period Weeks    Status New    Target Date 04/21/20      PT LONG TERM GOAL #2   Title Pt will demo 12 sec or less with 5x/ sit to stand without HHA to improve functional strength  Baseline 15 sec without HHA    Time 8    Period Weeks    Status New    Target Date 04/21/20                 Plan - 03/03/20 1840    Clinical Impression Statement Today's skilled PT session included continued gait training with SPC and without AD during session, decreased endurance noted requiring seated rest breaks. Patient requiring supervision with ambulation today. Rest of session spent establishing initial HEP focused on balance exercises to patient's tolerance. Due to increased anxiety completed all activities except ambulation in private treatment room. Will continue to progress toward all goals.    Personal Factors and Comorbidities Comorbidity 3+;Time since onset of injury/illness/exacerbation    Comorbidities hypertension, hyperlipidemia, type 2 diabetes mellitus, restless leg syndrome, diabetic neuropathy, morbid obesity, tobacco abuse, depression/anxiety, GERD, chronic venous stasis ulcer in bilateral lower extremities    Examination-Activity Limitations Squat;Stand;Stairs    Examination-Participation Restrictions Community Activity;Yard Work;Shop    Rehab Potential Good    PT Frequency 1x / week    PT Duration 8 weeks    PT Treatment/Interventions ADLs/Self Care Home Management;Gait training;Stair training;Functional mobility training;Therapeutic activities;Therapeutic exercise;Balance training;Neuromuscular re-education;Patient/family education;Manual techniques;Passive range of motion;Energy conservation;Joint Manipulations    PT Next Visit Plan How was HEP? continue exercises to promote endurance. Balance Exercises (complaint surfaces) Add Strenghtening exercises to HEP as tolerated    PT Home Exercise Plan Walking program: walking with family member  for 5 min and gradually progressing it to 30 min    Consulted and Agree with Plan of Care Patient;Family member/caregiver    Family Member Consulted Wife [name])           Patient will benefit from skilled therapeutic intervention in order to improve the following deficits and impairments:  Abnormal gait, Decreased activity tolerance, Decreased mobility, Decreased endurance, Difficulty walking, Impaired sensation, Impaired vision/preception, Postural dysfunction  Visit Diagnosis: Unsteadiness on feet  Other abnormalities of gait and mobility  Muscle weakness (generalized)     Problem List Patient Active Problem List   Diagnosis Date Noted  . Aortic calcification (HCC) 02/22/2020  . Carotid artery calcification, bilateral 02/22/2020  . Depression   . GERD (gastroesophageal reflux disease)   . Ischemic stroke (HCC), occipital   . Diabetic polyneuropathy associated with type 2 diabetes mellitus (HCC) 06/30/2018  . Other chronic pain 06/30/2018  . Erectile dysfunction due to type 2 diabetes mellitus (HCC) 06/30/2018  . Circulation disorder of lower extremity 05/31/2015  . IBS (irritable bowel syndrome) 12/24/2014  . 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 Diabetic leg ulcer (HCC) 07/18/2013  . Restless legs syndrome (RLS) 11/20/2012  . Generalized anxiety disorder   . Type 2 diabetes mellitus with vascular disease (HCC)   . Hyperlipidemia LDL goal <70 01/16/2009  . TOBACCO ABUSE 07/27/2007  . Morbid obesity (HCC) 09/29/2006  . Essential hypertension 09/29/2006  . CALCULUS, KIDNEY 05/30/2002    Tempie Donning, PT, DPT 03/03/2020, 6:43 PM  [facility] [address] [place], [place], [address] Phone: [phone]   Fax:  [phone]  Name: [name] [name] MRN: [id number] Date of Birth: September 19, 1960

## 2020-03-03 NOTE — Patient Instructions (Signed)
Tips for Talking with People who have Aphasia  . Say one thing at a time . Don't  rush - slow down, be patient . Talk face to face . Reduce background noise . Relax - be natural . Use pen and paper . Write down key words . Draw diagrams or pictures . Don't pretend you understand . Ask what helps . Recap - check you both understand . Be a partner, not a therapist   Aphasia does not affect intelligence, only language. The person with aphasia can still: make decisions, have opinions, and socialize.   Describing words  What group does it belong to?  What do I use it for?  Where can I find it?  What does it LOOK like?  What other words go with it?  What is the 1st sound of the word?   Many Ways to Communicate  Describe it Write it Draw it Gesture it Use related words  Resources  [facility] [place] with Dr. Jessica Obermeyer at [employer] - email [email or web address]  TalkPath Therapy app by [employer] Virtual Connections on Lingraph[employer] website National Aphasia Association - naa.aphasia.org Aphasia Recovery Connection - aphasiarecoveryconnection.org Tactus therapy apps Constant Therapy  Watch: Patience Listening and Communicating with Aphasia Patients on YouTube https://www.youtube.com/watch?v=[email or web address]     

## 2020-03-06 ENCOUNTER — Other Ambulatory Visit: Payer: Self-pay

## 2020-03-06 ENCOUNTER — Ambulatory Visit (INDEPENDENT_AMBULATORY_CARE_PROVIDER_SITE_OTHER): Payer: Managed Care, Other (non HMO) | Admitting: Internal Medicine

## 2020-03-06 ENCOUNTER — Encounter: Payer: Self-pay | Admitting: Internal Medicine

## 2020-03-06 VITALS — BP 146/78 | HR 106 | Ht 69.5 in | Wt 314.8 lb

## 2020-03-06 DIAGNOSIS — I639 Cerebral infarction, unspecified: Secondary | ICD-10-CM | POA: Diagnosis not present

## 2020-03-06 HISTORY — PX: OTHER SURGICAL HISTORY: SHX169

## 2020-03-06 MED ORDER — LISINOPRIL-HYDROCHLOROTHIAZIDE 20-12.5 MG PO TABS: 0.5000 | ORAL_TABLET | Freq: Every day | ORAL | 3 refills | Status: DC

## 2020-03-06 NOTE — Progress Notes (Signed)
Electrophysiology Office Note   Date:  03/06/2020   ID:  [name] [name], DOB 1961/01/23, MRN [id number]  PCP:  Hannah Beat, MD    Primary Electrophysiologist: Hillis Range, MD    CC: recent stroke   History of Present Illness: [name] [name] is a 59 y.o. male who presents today for electrophysiology evaluation.   He is referred by Dr Roda Shutters for cardiology assessment s/p stroke.  The patient presented 01/2020 to [facility] with L posterior MCA infarct in an embolic pattern. Echo was low risk.  No cause for stroke was determined.  The patient is referred for further evaluation.  Today, he denies symptoms of palpitations, chest pain, shortness of breath, orthopnea, PND, lower extremity edema, claudication, dizziness, presyncope, syncope, bleeding, or neurologic sequela. The patient is tolerating medications without difficulties and is otherwise without complaint today.    Past Medical History:  Diagnosis Date  . Angio-edema   . Anxiety   . Depression   . Diabetes mellitus type II    type 2  . GERD (gastroesophageal reflux disease)   . Headache    migraine  . History of kidney stones   . HLD (hyperlipidemia)   . HTN (hypertension)   . Morbid obesity (HCC)   . Restless legs syndrome (RLS) 11/20/2012  . Tobacco abuse   . Ulcer of left ankle (HCC)    last 2 months dime size dry dressing changing q day  . Urticaria    Past Surgical History:  Procedure Laterality Date  . Arm surgery Right 1969   Abscess excision and debridement at elbow  . COLONOSCOPY WITH PROPOFOL N/A 09/06/2016   Procedure: COLONOSCOPY WITH PROPOFOL;  Surgeon: Sherrilyn Rist, MD;  Location: [facility];  Service: Gastroenterology;  Laterality: N/A;  . I & D EXTREMITY Left 05/29/2014   Procedure: IRRIGATION AND DEBRIDEMENT OF LEFT LEG WOUND AND PLACEMENT OF  INTEGRA  AND  VAC;  Surgeon: Wayland Denis, DO;  Location: [facility];  Service: Plastics;  Laterality: Left;  . implantable loop  recorder implant  03/06/2020   Medtronic Reveal [place] model [id number] (SN  [id number]) implanted by Dr Johney Frame for cryptogenic stroke  . INCISION AND DRAINAGE OF WOUND Left 04/23/2014   Procedure: IRRIGATION AND DEBRIDEMENT LOWER LEFT LEG WOUND WITH PLACEMENT OF INTEGRA AND VAC;  Surgeon: Wayland Denis, DO;  Location: [facility];  Service: Plastics;  Laterality: Left;  . kidney stone removal  2004  . MINOR APPLICATION OF WOUND VAC Left 04/23/2014   Procedure: MINOR APPLICATION OF WOUND VAC;  Surgeon: Wayland Denis, DO;  Location: [facility];  Service: Plastics;  Laterality: Left;  . SKIN SPLIT GRAFT Left 06/18/2014   Procedure: SKIN GRAFT SPLIT THICKNESS TO LOWER LEFT LEG WOUND WITH PLACEMENT OF VAC;  Surgeon: Wayland Denis, DO;  Location: [facility];  Service: Plastics;  Laterality: Left;  Marland Kitchen VASECTOMY  1994     Current Outpatient Medications  Medication Sig Dispense Refill  . acetaminophen (TYLENOL) 500 MG tablet Take 1,000 mg by mouth 2 (two) times daily as needed (for pain).    Marland Kitchen aspirin EC 81 MG EC tablet Take 1 tablet (81 mg total) by mouth daily. Swallow whole. 30 tablet 3  . bromocriptine (PARLODEL) 2.5 MG tablet Take 0.5 tablets (1.25 mg total) by mouth at bedtime. 45 tablet 3  . clopidogrel (PLAVIX) 75 MG tablet Take 1 tablet (75 mg total) by mouth daily for 21 days. FOR 3 WEEKS ONLY  21 tablet 0  . colesevelam (WELCHOL) 625 MG tablet Take 2 tablets (1,250 mg total) by mouth daily. 60 tablet 11  . dapagliflozin propanediol (FARXIGA) 10 MG TABS tablet Take 10 mg by mouth daily before breakfast. 30 tablet 11  . Dulaglutide (TRULICITY) 4.5 MG/0.5ML SOPN Inject 4.5 mg into the skin once a week. 12 pen 3  . EPINEPHrine 0.3 mg/0.3 mL IJ SOAJ injection INJECT 0.3 ML INTO THE  MUSCLE ONCE FOR 1 DOSE. 2 each 0  . gabapentin (NEURONTIN) 300 MG capsule TAKE 3 CAPSULES BY MOUTH 3  TIMES DAILY 810 capsule 3  . glipiZIDE (GLUCOTROL XL) 10 MG 24 hr tablet TAKE 2  TABLETS BY MOUTH  DAILY WITH BREAKFAST 180 tablet 1  . glucose blood (CONTOUR NEXT TEST) test strip Use to check blood sugar two times a day. 100 each 5  . Lancets Micro Thin 33G MISC Use to check blood sugar two times daily. 100 each 5  . meloxicam (MOBIC) 15 MG tablet TAKE 1 TABLET BY MOUTH IN  THE MORNING 90 tablet 3  . metFORMIN (GLUCOPHAGE-XR) 500 MG 24 hr tablet TAKE 4 TABLETS BY MOUTH  DAILY WITH BREAKFAST 360 tablet 3  . Multiple Vitamin (MULTIVITAMIN) tablet Take 1 tablet by mouth daily.      . pantoprazole (PROTONIX) 40 MG tablet TAKE 1 TABLET BY MOUTH  DAILY 90 tablet 3  . PARoxetine (PAXIL) 30 MG tablet TAKE 1 TABLET BY MOUTH IN  THE MORNING 90 tablet 3  . pentoxifylline (TRENTAL) 400 MG CR tablet TAKE 1 TABLET BY MOUTH 3  TIMES DAILY WITH MEALS 270 tablet 3  . pregabalin (LYRICA) 75 MG capsule Take 1 capsule (75 mg total) by mouth 2 (two) times daily. 60 capsule 1  . rOPINIRole (REQUIP) 0.5 MG tablet TAKE 1 TABLET BY MOUTH  TWICE DAILY 180 tablet 3  . rosuvastatin (CRESTOR) 20 MG tablet Take 1 tablet (20 mg total) by mouth at bedtime. 30 tablet 2  . sildenafil (VIAGRA) 100 MG tablet Take 1 tablet (100 mg total) by mouth daily as needed for erectile dysfunction. 10 tablet 5  . lisinopril-hydrochlorothiazide (ZESTORETIC) 20-12.5 MG tablet Take 0.5 tablets by mouth daily. 45 tablet 3   No current facility-administered medications for this visit.    Allergies:   Cashew nut oil, Pistachio nut (diagnostic), Latex, Tape, Betadine [povidone iodine], Lidocaine, and Penicillins   Social History:  The patient  reports that he has been smoking cigarettes. He has a 10.00 pack-year smoking history. He has never used smokeless tobacco. He reports current alcohol use. He reports that he does not use drugs.   Family History:  The patient's family history includes Cancer in his mother; Diabetes in his father and mother; Heart disease in his father; Hyperlipidemia in his father; Hypertension in his  father; Leukemia in his mother; Pneumonia in his mother; Stroke in his mother.    ROS:  Please see the history of present illness.   All other systems are personally reviewed and negative.    PHYSICAL EXAM: Vitals:   03/06/20 1225  BP: (!) 146/78  Pulse: (!) 106  SpO2: 95%     GEN: Well nourished, well developed, in no acute distress HEENT: normal Neck: no JVD  Cardiac: RRR; no murmurs, rubs, or gallops,no edema  Respiratory:  clear to auscultation bilaterally, normal work of breathing GI: soft, nontender, nondistended, + BS MS: no deformity or atrophy Skin: warm and dry  Neuro:  Strength and sensation are intact Psych:  euthymic mood, full affect  EKG:  EKG is ordered today. The ekg ordered today is personally reviewed and shows sinus with PVCs, RBBB, anterior infact pattern, LAD   Recent Labs: 08/29/2019: TSH 1.540 02/14/2020: ALT 20 02/15/2020: BUN 15; Creatinine, Ser 1.08; Hemoglobin 16.1; Platelets 212; Potassium 3.9; Sodium 137  personally reviewed   Lipid Panel     Component Value Date/Time   CHOL 102 02/15/2020 0142   CHOL 102 08/29/2019 0857   TRIG 290 (H) 02/15/2020 0142   HDL 29 (L) 02/15/2020 0142   HDL 30 (L) 08/29/2019 0857   CHOLHDL 3.5 02/15/2020 0142   VLDL 58 (H) 02/15/2020 0142   LDLCALC 15 02/15/2020 0142   LDLCALC 28 08/29/2019 0857   LDLDIRECT 76 02/19/2014 1635   personally reviewed   Wt Readings from Last 3 Encounters:  03/06/20 (!) 314 lb 12.8 oz (142.8 kg)  02/20/20 (!) 330 lb (149.7 kg)  02/14/20 230 lb (104.3 kg)      Other studies personally reviewed: Additional studies/ records that were reviewed today include: Dr Warren Danes notes, prior echo  Review of the above records today demonstrates: as above  Assessment and Plan:  1. Cryptogenic stroke The patient presents with cryptogenic stroke.   Workup has been uneventful this far.  I agree with Dr Roda Shutters that long term  Monitoring to evaluate for afib is appropriate.  He was observed on  telemetry in the hospital without arrhythmias identified.  I spoke at length with the patient about monitoring for afib an implantable loop recorder.  Risks, benefits, and alteratives to implantable loop recorder were discussed with the patient today.   At this time, the patient is very clear in their decision to proceed with implantable loop recorder.    Randolm Idol, MD  03/06/2020 1:30 PM     [facility] [address] [place] [address] [phone] (office) [phone] (fax)      PROCEDURES:   1. Implantable loop recorder implantation        DESCRIPTION OF PROCEDURE:  Informed written consent was obtained.  The patient required no sedation for the procedure today.  The patients left chest was prepped and draped. Mapping over the patient's chest was performed to identify the appropriate ILR site.  This area was found to be the left parasternal region over the 3rd-4th intercostal space.  The skin overlying this region was infiltrated with lidocaine for local analgesia.  A 0.5-cm incision was made at the implant site.  A subcutaneous ILR pocket was fashioned using a combination of sharp and blunt dissection.  A Medtronic Reveal Linq model [id number] (SN  [id number]) implantable loop recorder was then placed into the pocket R waves were very prominent and measured > 0.2 mV. EBL<1 ml.  Steri- Strips and a sterile dressing were then applied.  There were no early apparent complications.     CONCLUSIONS:   1. Successful implantation of a Medtronic Reveal LINQ implantable loop recorder for cryptogenic stroke  2. No early apparent complications.   Hillis Range MD, [facility] 03/06/2020 1:30 PM

## 2020-03-06 NOTE — Patient Instructions (Addendum)
Medication Instructions:  Reduce you Lisinopril-Hydrochlorothiazide to 1/2 tablet a day  Labwork: None ordered.  Testing/Procedures: None ordered.   Your physician wants you to follow-up in: as needed.    Implantable Loop Recorder Placement, Care After This sheet gives you information about how to care for yourself after your procedure. Your health care provider may also give you more specific instructions. If you have problems or questions, contact your health care provider. What can I expect after the procedure? After the procedure, it is common to have:  Soreness or discomfort near the incision.  Some swelling or bruising near the incision.  Follow these instructions at home: Incision care  1.  Leave your outer dressing on for 24 hours.  After 24 hours you can remove your outer dressing and shower. 2. Leave adhesive strips in place. These skin closures may need to stay in place for 1-2 weeks. If adhesive strip edges start to loosen and curl up, you may trim the loose edges.  You may remove the strips if they have not fallen off after 2 weeks. 3. Check your incision area every day for signs of infection. Check for: a. Redness, swelling, or pain. b. Fluid or blood. c. Warmth. d. Pus or a bad smell. 4. Do not take baths, swim, or use a hot tub until your incision is completely healed. 5. If your wound site starts to bleed apply pressure.      If you have any questions/concerns please call the device clinic at [phone].  Activity  Return to your normal activities.  General instructions  Follow instructions from your health care provider about how to manage your implantable loop recorder and transmit the information. Learn how to activate a recording if this is necessary for your type of device.  Do not go through a metal detection gate, and do not let someone hold a metal detector over your chest. Show your ID card.  Do not have an MRI unless you check with your  health care provider first.  Take over-the-counter and prescription medicines only as told by your health care provider.  Keep all follow-up visits as told by your health care provider. This is important. Contact a health care provider if:  You have redness, swelling, or pain around your incision.  You have a fever.  You have pain that is not relieved by your pain medicine.  You have triggered your device because of fainting (syncope) or because of a heartbeat that feels like it is racing, slow, fluttering, or skipping (palpitations). Get help right away if you have:  Chest pain.  Difficulty breathing. Summary  After the procedure, it is common to have soreness or discomfort near the incision.  Change your dressing as told by your health care provider.  Follow instructions from your health care provider about how to manage your implantable loop recorder and transmit the information.  Keep all follow-up visits as told by your health care provider. This is important. This information is not intended to replace advice given to you by your health care provider. Make sure you discuss any questions you have with your health care provider. Document Released: 04/27/2015 Document Revised: 07/01/2017 Document Reviewed: 07/01/2017 Elsevier Patient Education  2020 [employer].

## 2020-03-10 ENCOUNTER — Ambulatory Visit: Payer: Managed Care, Other (non HMO) | Admitting: Speech Pathology

## 2020-03-10 ENCOUNTER — Other Ambulatory Visit: Payer: Self-pay

## 2020-03-10 ENCOUNTER — Ambulatory Visit: Payer: Managed Care, Other (non HMO)

## 2020-03-10 DIAGNOSIS — R4701 Aphasia: Secondary | ICD-10-CM

## 2020-03-10 DIAGNOSIS — R2681 Unsteadiness on feet: Secondary | ICD-10-CM

## 2020-03-10 DIAGNOSIS — R2689 Other abnormalities of gait and mobility: Secondary | ICD-10-CM

## 2020-03-10 DIAGNOSIS — M6281 Muscle weakness (generalized): Secondary | ICD-10-CM

## 2020-03-10 NOTE — Patient Instructions (Signed)
Access Code: [email or web address] URL: [email or web address] Date: 03/10/2020 Prepared by: Jethro Bastos  Exercises Standing Balance with Eyes Closed on Foam - 1 x daily - 5 x weekly - 1 sets - 3 reps - 30 hold Feet Apart with Head Nods on Foam Pad - 1 x daily - 5 x weekly - 2 sets - 10 reps Feet Apart on Foam Pad with Head Rotation - 1 x daily - 5 x weekly - 2 sets - 10 reps Tandem Stance - 1 x daily - 5 x weekly - 1 sets - 3 reps - 30 hold Sit to Stand with Arms Crossed - 1 x daily - 5 x weekly - 3 sets - 10 reps Standing Hip Abduction with Counter Support - 1 x daily - 5 x weekly - 2 sets - 10 reps Standing Hip Extension with Counter Support - 1 x daily - 5 x weekly - 2 sets - 10 reps Step Up - 1 x daily - 5 x weekly - 2 sets - 10 reps

## 2020-03-10 NOTE — Therapy (Signed)
[facility] [address] [place], [place], [address] Phone: [phone]   Fax:  [phone]  Physical Therapy Treatment  Patient Details  Name: [name] [name] MRN: [id number] Date of Birth: 1960/08/04 Referring Provider (PT): Dr. Dallas Schimke   Encounter Date: 03/10/2020   PT End of Session - 03/10/20 1106    Visit Number 3    Number of Visits 9    Date for PT Re-Evaluation 04/21/20    Authorization Type Medicare    Progress Note Due on Visit 9    PT Start Time 1101    PT Stop Time 1143    PT Time Calculation (min) 42 min    Equipment Utilized During Treatment Gait belt    Activity Tolerance Patient tolerated treatment well    Behavior During Therapy WFL for tasks assessed/performed           Past Medical History:  Diagnosis Date   Angio-edema    Anxiety    Depression    Diabetes mellitus type II    type 2   GERD (gastroesophageal reflux disease)    Headache    migraine   History of kidney stones    HLD (hyperlipidemia)    HTN (hypertension)    Morbid obesity (HCC)    Restless legs syndrome (RLS) 11/20/2012   Tobacco abuse    Ulcer of left ankle (HCC)    last 2 months dime size dry dressing changing q day   Urticaria     Past Surgical History:  Procedure Laterality Date   Arm surgery Right 1969   Abscess excision and debridement at elbow   COLONOSCOPY WITH PROPOFOL N/A 09/06/2016   Procedure: COLONOSCOPY WITH PROPOFOL;  Surgeon: Sherrilyn Rist, MD;  Location: [facility];  Service: Gastroenterology;  Laterality: N/A;   I & D EXTREMITY Left 05/29/2014   Procedure: IRRIGATION AND DEBRIDEMENT OF LEFT LEG WOUND AND PLACEMENT OF  INTEGRA  AND  VAC;  Surgeon: Wayland Denis, DO;  Location: [facility];  Service: Plastics;  Laterality: Left;   implantable loop recorder implant  03/06/2020   Medtronic Reveal [place] model LNQ11 (SN  [id number]) implanted by Dr Johney Frame for  cryptogenic stroke   INCISION AND DRAINAGE OF WOUND Left 04/23/2014   Procedure: IRRIGATION AND DEBRIDEMENT LOWER LEFT LEG WOUND WITH PLACEMENT OF INTEGRA AND VAC;  Surgeon: Wayland Denis, DO;  Location: [facility];  Service: Plastics;  Laterality: Left;   kidney stone removal  2004   MINOR APPLICATION OF WOUND VAC Left 04/23/2014   Procedure: MINOR APPLICATION OF WOUND VAC;  Surgeon: Wayland Denis, DO;  Location: [facility];  Service: Plastics;  Laterality: Left;   SKIN SPLIT GRAFT Left 06/18/2014   Procedure: SKIN GRAFT SPLIT THICKNESS TO LOWER LEFT LEG WOUND WITH PLACEMENT OF VAC;  Surgeon: Wayland Denis, DO;  Location: [facility];  Service: Plastics;  Laterality: Left;   VASECTOMY  1994    There were no vitals filed for this visit.   Subjective Assessment - 03/10/20 1104    Subjective Patient/wife reports that he had loop recorder placed in, and doing well since last visit. Patient reports that HEP is going well. Reports completed walking around a warehouse with few rest breaks. No pain or falls to report.    Patient is accompained by: Family member   Wife, [name]   Pertinent History hypertension, hyperlipidemia, type 2 diabetes mellitus, restless leg syndrome, diabetic neuropathy, morbid obesity, tobacco abuse,  depression/anxiety, GERD, chronic venous stasis ulcer in bilateral lower extremities    Limitations Reading;Walking;House hold activities    How long can you sit comfortably? no issues    How long can you stand comfortably? 10 min    How long can you walk comfortably? 5 min    Diagnostic tests CT head showed Hyperdense right MCA.  CTA head and neck and perfusion study showed 10 cc region of completed infarction in left occipital cortex and additional 11 cc at risk/penumbra. [name] (wife) reports that short term memory is affected.    Patient Stated Goals improve walking    Currently in Pain? No/denies               [facility] Adult PT  Treatment/Exercise - 03/10/20 0001      Transfers   Transfers Sit to Stand;Stand to Sit    Sit to Stand 6: Modified independent (Device/Increase time)    Stand to Sit 6: Modified independent (Device/Increase time)    Comments completed sit <> stands without UE support, 2 x 10 reps. Increased fatigue noted at end of completion. intermittent rest breaks required throughout session due to fatigue and SOB.       Ambulation/Gait   Ambulation/Gait Yes    Ambulation/Gait Assistance 5: Supervision    Ambulation/Gait Assistance Details completed ambulation without AD x 350 ft. 3/10 SOB after completion.     Ambulation Distance (Feet) 350 Feet    Assistive device None    Gait Pattern Step-through pattern    Ambulation Surface Level;Indoor      Exercises   Exercises Knee/Hip      Knee/Hip Exercises: Standing   Hip Abduction Stengthening;Both;1 set;10 reps;Knee straight;Limitations    Abduction Limitations completed with countertop support     Hip Extension Stengthening;Both;1 set;10 reps;Knee straight;Limitations    Extension Limitations completed with countertop support. verbal cues to keep knee extended during completion    Forward Step Up Both;1 set;10 reps;Hand Hold: 1;Step Height: 6";Limitations    Forward Step Up Limitations completed with single UE support with RUE, completed x 10 on BLE alteranting feet. increased challenge noted with sequencing but able to correct mistakes            Access Code: [email or web address] URL: [email or web address] Date: 03/10/2020 Prepared by: Jethro Bastos  Exercises Standing Balance with Eyes Closed on Foam - 1 x daily - 5 x weekly - 1 sets - 3 reps - 30 hold Feet Apart with Head Nods on Foam Pad - 1 x daily - 5 x weekly - 2 sets - 10 reps Feet Apart on Foam Pad with Head Rotation - 1 x daily - 5 x weekly - 2 sets - 10 reps Tandem Stance - 1 x daily - 5 x weekly - 1 sets - 3 reps - 30 hold Sit to Stand with Arms Crossed - 1 x daily - 5 x  weekly - 3 sets - 10 reps Standing Hip Abduction with Counter Support - 1 x daily - 5 x weekly - 2 sets - 10 reps Standing Hip Extension with Counter Support - 1 x daily - 5 x weekly - 2 sets - 10 reps Step Up - 1 x daily - 5 x weekly - 2 sets - 10 reps        PT Education - 03/10/20 1138    Education Details Educated on HEP update    Person(s) Educated Patient;Spouse    Methods Explanation;Demonstration;Handout    Comprehension Verbalized understanding;Returned demonstration;Need further instruction  PT Short Term Goals - 02/25/20 1933      PT SHORT TERM GOAL #1   Title patient will be able to walk with or without AD for 10 min to improve walking endurance    Baseline <5 min without AD    Time 4    Period Weeks    Status New    Target Date 03/24/20             PT Long Term Goals - 02/25/20 1933      PT LONG TERM GOAL #1   Title Pt will be able to ambulate for 30 min with or without AD to improve walking endurance    Baseline <5 min without AD    Time 8    Period Weeks    Status New    Target Date 04/21/20      PT LONG TERM GOAL #2   Title Pt will demo 12 sec or less with 5x/ sit to stand without HHA to improve functional strength    Baseline 15 sec without HHA    Time 8    Period Weeks    Status New    Target Date 04/21/20                 Plan - 03/10/20 1144    Clinical Impression Statement Continued progression of gait trianing today with focus on improved endurance with longer ambulation distance and without AD today. Progressed HEP to include standing strengthening exercises as tolerated by patient. Will continue to progress toward all goals.    Personal Factors and Comorbidities Comorbidity 3+;Time since onset of injury/illness/exacerbation    Comorbidities hypertension, hyperlipidemia, type 2 diabetes mellitus, restless leg syndrome, diabetic neuropathy, morbid obesity, tobacco abuse, depression/anxiety, GERD, chronic venous stasis  ulcer in bilateral lower extremities    Examination-Activity Limitations Squat;Stand;Stairs    Examination-Participation Restrictions Community Activity;Yard Work;Shop    Rehab Potential Good    PT Frequency 1x / week    PT Duration 8 weeks    PT Treatment/Interventions ADLs/Self Care Home Management;Gait training;Stair training;Functional mobility training;Therapeutic activities;Therapeutic exercise;Balance training;Neuromuscular re-education;Patient/family education;Manual techniques;Passive range of motion;Energy conservation;Joint Manipulations    PT Next Visit Plan How was HEP? continue exercises to promote endurance. Balance Exercises (complaint surfaces) Add Strenghtening exercises to HEP as tolerated    PT Home Exercise Plan Walking program: walking with family member for 5 min and gradually progressing it to 30 min    Consulted and Agree with Plan of Care Patient;Family member/caregiver    Family Member Consulted Wife [name]([name])           Patient will benefit from skilled therapeutic intervention in order to improve the following deficits and impairments:  Abnormal gait, Decreased activity tolerance, Decreased mobility, Decreased endurance, Difficulty walking, Impaired sensation, Impaired vision/preception, Postural dysfunction  Visit Diagnosis: Unsteadiness on feet  Other abnormalities of gait and mobility  Muscle weakness (generalized)     Problem List Patient Active Problem List   Diagnosis Date Noted   Aortic calcification (HCC) 02/22/2020   Carotid artery calcification, bilateral 02/22/2020   Depression    GERD (gastroesophageal reflux disease)    Ischemic stroke (HCC), occipital    Diabetic polyneuropathy associated with type 2 diabetes mellitus (HCC) 06/30/2018   Other chronic pain 06/30/2018   Erectile dysfunction due to type 2 diabetes mellitus (HCC) 06/30/2018   Circulation disorder of lower extremity 05/31/2015   IBS (irritable bowel syndrome)  12/24/2014   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) 04/23/2014   Diabetic leg ulcer (HCC) 07/18/2013   Restless legs syndrome (RLS) 11/20/2012   Generalized anxiety disorder    Type 2 diabetes mellitus with vascular disease (HCC)    Hyperlipidemia LDL goal <70 01/16/2009   TOBACCO ABUSE 07/27/2007   Morbid obesity (HCC) 09/29/2006   Essential hypertension 09/29/2006   CALCULUS, KIDNEY 05/30/2002    Tempie Donning, PT, DPT 03/10/2020, 1:28 PM  [facility] [address] [place], [place], [address] Phone: [phone]   Fax:  [phone]  Name: [name] [name] MRN: [id number] Date of Birth: [birth year]

## 2020-03-10 NOTE — Patient Instructions (Signed)
Try some mindfulness and deep breathing. Check out the Calm app, or Breathe2Relax  Download TalkPath Therapy. UNGuss Bunde, PW: therapy  Try some easy activities. Do at least ONE challenging activity everyday. It's OK if you get some wrong. You need to make mistakes in order to learn and get better.

## 2020-03-10 NOTE — Therapy (Signed)
[facility] [address] [place], [place], [address] Phone: [phone]   Fax:  [phone]  Speech Language Pathology Treatment  Patient Details  Name: [name] [name] MRN: [id number] Date of Birth: Aug 29, 1960 Referring Provider (SLP): Osvaldo Shipper (referring); Copland, Spencer (PCP/documentation)   Encounter Date: 03/10/2020   End of Session - 03/10/20 1312    Visit Number 2    Number of Visits 17    Date for SLP Re-Evaluation 05/02/20    SLP Start Time 1153    SLP Stop Time  1234    SLP Time Calculation (min) 41 min    Activity Tolerance Patient tolerated treatment well           Past Medical History:  Diagnosis Date   Angio-edema    Anxiety    Depression    Diabetes mellitus type II    type 2   GERD (gastroesophageal reflux disease)    Headache    migraine   History of kidney stones    HLD (hyperlipidemia)    HTN (hypertension)    Morbid obesity (HCC)    Restless legs syndrome (RLS) 11/20/2012   Tobacco abuse    Ulcer of left ankle (HCC)    last 2 months dime size dry dressing changing q day   Urticaria     Past Surgical History:  Procedure Laterality Date   Arm surgery Right 1969   Abscess excision and debridement at elbow   COLONOSCOPY WITH PROPOFOL N/A 09/06/2016   Procedure: COLONOSCOPY WITH PROPOFOL;  Surgeon: Sherrilyn Rist, MD;  Location: [facility];  Service: Gastroenterology;  Laterality: N/A;   I & D EXTREMITY Left 05/29/2014   Procedure: IRRIGATION AND DEBRIDEMENT OF LEFT LEG WOUND AND PLACEMENT OF  INTEGRA  AND  VAC;  Surgeon: Wayland Denis, DO;  Location: [facility];  Service: Plastics;  Laterality: Left;   implantable loop recorder implant  03/06/2020   Medtronic Reveal [place] model LNQ11 (SN  [id number]) implanted by Dr Johney Frame for cryptogenic stroke   INCISION AND DRAINAGE OF WOUND Left 04/23/2014   Procedure: IRRIGATION AND DEBRIDEMENT LOWER  LEFT LEG WOUND WITH PLACEMENT OF INTEGRA AND VAC;  Surgeon: Wayland Denis, DO;  Location: [facility];  Service: Plastics;  Laterality: Left;   kidney stone removal  2004   MINOR APPLICATION OF WOUND VAC Left 04/23/2014   Procedure: MINOR APPLICATION OF WOUND VAC;  Surgeon: Wayland Denis, DO;  Location: [facility];  Service: Plastics;  Laterality: Left;   SKIN SPLIT GRAFT Left 06/18/2014   Procedure: SKIN GRAFT SPLIT THICKNESS TO LOWER LEFT LEG WOUND WITH PLACEMENT OF VAC;  Surgeon: Wayland Denis, DO;  Location: [facility];  Service: Plastics;  Laterality: Left;   VASECTOMY  1994    There were no vitals filed for this visit.   Subjective Assessment - 03/10/20 1157    Subjective "How was your weekend."    Currently in Pain? No/denies                 ADULT SLP TREATMENT - 03/10/20 1157      General Information   Behavior/Cognition Alert;Cooperative;Pleasant mood      Treatment Provided   Treatment provided Cognitive-Linquistic      Pain Assessment   Pain Assessment No/denies pain      Cognitive-Linquistic Treatment   Treatment focused on Aphasia;Patient/family/caregiver education    Skilled Treatment Patient present for session with wife, reports he has been  doing some workbooks at home that were sent to him by a friend. Wants to download an app for iPad to work on language. He reports a lot of frustration with reading. Used TalkPath Therapy app with pt today for simple language activities. Accuracy for following directions 90%, word ID 100%. With sentence fill ins, pt accuracy 66%; during this activity pt reported he was becoming "tense"; we took a break during which pt was tearful. Reports he is very frustrated with aphasia and not being able to do things correctly. We discussed that challenges and making errors are a part of therapy, and some strategies pt can try to manage his anxiety and tolerance for activities where he might  make a mistake. He has follow-up with PCP soon and plans to address med change for depression/anxiety. SLP suggested pt complete therapy tasks independently without an observer to reduce anxiety during these tasks. Provided graduated activities with some simple tasks to build pt confidence. Instructed pt to complete at least one challenging task each day, can return to simple tasks or take a break if he is becoming frustrated.      Assessment / Recommendations / Plan   Plan Continue with current plan of care      Progression Toward Goals   Progression toward goals Progressing toward goals            SLP Education - 03/10/20 1311    Education Details mistakes are part of the learning process, strategies to manage anxiety and how to adjust activities at home if needed    Person(s) Educated Patient;Spouse    Methods Explanation    Comprehension Verbalized understanding;Need further instruction            SLP Short Term Goals - 03/10/20 1313      SLP SHORT TERM GOAL #1   Title Patient will participate functionally in 8 minutes simple-mod complex conversation using compensations for anomia as necessary, x3 visits.    Time 4    Period Weeks    Status On-going      SLP SHORT TERM GOAL #2   Title Pt will demonstrate auditory comprehension of 5 minutes simple-mod complex material by answering WH questions >90% accuracy x 2 sessions.    Time 4    Period Weeks    Status On-going      SLP SHORT TERM GOAL #3   Title Pt will participate in assessment of reading, writing and cognition with goals added PRN    Time 4    Period Weeks    Status On-going            SLP Long Term Goals - 03/10/20 1313      SLP LONG TERM GOAL #1   Title Patient will participate functionally in 15 minutes mod complex-complex conversation using compensations as necessary x 3 visits.    Time 8    Period Weeks    Status On-going      SLP LONG TERM GOAL #2   Title Patient will demonstrate auditory  comprehension of 15 minutes mod complex content (conversation, podcast, radio program, etc) x 3 visits by answering detailed questions >90% accuracy.    Time 8    Period Weeks    Status On-going      SLP LONG TERM GOAL #3   Title Patient will report using accessibility features to read and reply to text messages x 3 visits.    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 Pt and wife report he does quite well with simple conversation, but he has difficulty answering "more specific" questions. He is somewhat anxious, which he has noticed affects his communication abilities. Processing appears slower, and SLP noted repetition, simple rephrasing, and slower speech were helpful for pt's understanding. 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 Continues to report frustration with communication abilities. Speech is fluent, characterized by hesitations, circumlocution, occasional paraphasias; patient is aware ~75% of the time. I recommend skilled ST to maximize communication and cognitive abilities to reduce frustration, improve language abilities, safety, and for possible return to work.    Speech Therapy Frequency 2x / week    Duration --   8 weeks or 17 visits   Treatment/Interventions Language facilitation;Environmental controls;Cueing hierarchy;SLP instruction and feedback;Cognitive reorganization;Compensatory techniques;Functional tasks;Compensatory strategies;Internal/external  aids;Multimodal communcation approach;Patient/family education    Potential to Achieve Goals Good    SLP Home Exercise Plan provided aphasia handout and info re: Constant [employer]           Patient will benefit from skilled therapeutic intervention in order to improve the following deficits and impairments:   Aphasia    Problem List Patient Active Problem List   Diagnosis Date Noted   Aortic calcification (HCC) 02/22/2020   Carotid artery calcification, bilateral 02/22/2020   Depression    GERD (gastroesophageal reflux disease)    Ischemic stroke (HCC), occipital    Diabetic polyneuropathy associated with type 2 diabetes mellitus (HCC) 06/30/2018   Other chronic pain 06/30/2018   Erectile dysfunction due to type 2 diabetes mellitus (HCC) 06/30/2018   Circulation disorder of lower extremity 05/31/2015   IBS (irritable bowel syndrome) 12/24/2014   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 [place], [place], [address] Phone: [phone]   Fax:  [phone]   Name: [name] [name] MRN: [id number] Date of Birth: Oct 25, 1960

## 2020-03-12 ENCOUNTER — Other Ambulatory Visit: Payer: Self-pay

## 2020-03-12 ENCOUNTER — Ambulatory Visit: Payer: Managed Care, Other (non HMO) | Admitting: Speech Pathology

## 2020-03-12 DIAGNOSIS — R4701 Aphasia: Secondary | ICD-10-CM | POA: Diagnosis not present

## 2020-03-12 NOTE — Therapy (Signed)
[facility] [address] [place], [place], [address] Phone: [phone]   Fax:  [phone]  Speech Language Pathology Treatment  Patient Details  Name: [name] [name] MRN: [id number] Date of Birth: 01-31-1961 Referring Provider (SLP): Osvaldo Shipper (referring); Copland, Karleen Hampshire (PCP/documentation)   Encounter Date: 03/12/2020   End of Session - 03/12/20 1217    Visit Number 3    Number of Visits 17    Date for SLP Re-Evaluation 05/02/20    SLP Start Time 1105    SLP Stop Time  1148    SLP Time Calculation (min) 43 min    Activity Tolerance Patient tolerated treatment well           Past Medical History:  Diagnosis Date  . Angio-edema   . Anxiety   . Depression   . Diabetes mellitus type II    type 2  . GERD (gastroesophageal reflux disease)   . Headache    migraine  . History of kidney stones   . HLD (hyperlipidemia)   . HTN (hypertension)   . Morbid obesity (HCC)   . Restless legs syndrome (RLS) 11/20/2012  . Tobacco abuse   . Ulcer of left ankle (HCC)    last 2 months dime size dry dressing changing q day  . Urticaria     Past Surgical History:  Procedure Laterality Date  . Arm surgery Right 1969   Abscess excision and debridement at elbow  . COLONOSCOPY WITH PROPOFOL N/A 09/06/2016   Procedure: COLONOSCOPY WITH PROPOFOL;  Surgeon: Sherrilyn Rist, MD;  Location: [facility];  Service: Gastroenterology;  Laterality: N/A;  . I & D EXTREMITY Left 05/29/2014   Procedure: IRRIGATION AND DEBRIDEMENT OF LEFT LEG WOUND AND PLACEMENT OF  INTEGRA  AND  VAC;  Surgeon: Wayland Denis, DO;  Location: [facility];  Service: Plastics;  Laterality: Left;  . implantable loop recorder implant  03/06/2020   Medtronic Reveal [place] model LNQ11 (SN  [id number]) implanted by Dr Johney Frame for cryptogenic stroke  . INCISION AND DRAINAGE OF WOUND Left 04/23/2014   Procedure: IRRIGATION AND DEBRIDEMENT LOWER  LEFT LEG WOUND WITH PLACEMENT OF INTEGRA AND VAC;  Surgeon: Wayland Denis, DO;  Location: [facility];  Service: Plastics;  Laterality: Left;  . kidney stone removal  2004  . MINOR APPLICATION OF WOUND VAC Left 04/23/2014   Procedure: MINOR APPLICATION OF WOUND VAC;  Surgeon: Wayland Denis, DO;  Location: [facility];  Service: Plastics;  Laterality: Left;  . SKIN SPLIT GRAFT Left 06/18/2014   Procedure: SKIN GRAFT SPLIT THICKNESS TO LOWER LEFT LEG WOUND WITH PLACEMENT OF VAC;  Surgeon: Wayland Denis, DO;  Location: [facility];  Service: Plastics;  Laterality: Left;  Marland Kitchen VASECTOMY  1994    There were no vitals filed for this visit.   Subjective Assessment - 03/12/20 1106    Subjective "One eighty five percent."    Patient is accompained by: Family member   wife [name] [name]   Currently in Pain? No/denies                 ADULT SLP TREATMENT - 03/12/20 1159      General Information   Behavior/Cognition Alert;Cooperative;Pleasant mood      Treatment Provided   Treatment provided Cognitive-Linquistic      Pain Assessment   Pain Assessment No/denies pain      Cognitive-Linquistic Treatment   Treatment focused on Aphasia;Patient/family/caregiver education  Skilled Treatment Patient and wife present for session. Patient has been working on [employer] Therapy activities at home, reports ~85% accuracy with activities (aphasic, see "S"). Reporting visual deficits; capital letters are easier for him to read. Today we targeted writing; completed portions of WAB writing as well as supplemental function writing activity (simple recipe). Pt self-monitored and made several corrections in word level writing. When writing recipe, paraphasias noted (play/place, coke/cook) of which pt was unaware, as well as spelling errors (Ingredients, minutes), of which pt was aware. Discussed pt's home activities including naming, writing sentences; pt to bring to next session, as  well as continue TalkPath Therapy at home. Less anxious today, but some anxiety noted with more difficult tasks (reading vs writing). Has OT eval next week.      Assessment / Recommendations / Plan   Plan Continue with current plan of care      Progression Toward Goals   Progression toward goals Progressing toward goals              SLP Short Term Goals - 03/12/20 1220      SLP SHORT TERM GOAL #1   Title Patient will participate functionally in 8 minutes simple-mod complex conversation using compensations for anomia as necessary, x3 visits.    Time 4    Period Weeks    Status On-going      SLP SHORT TERM GOAL #2   Title Pt will demonstrate auditory comprehension of 5 minutes simple-mod complex material by answering WH questions >90% accuracy x 2 sessions.    Time 4    Period Weeks    Status On-going      SLP SHORT TERM GOAL #3   Title Pt will participate in assessment of reading, writing and cognition with goals added PRN    Time 4    Period Weeks    Status On-going            SLP Long Term Goals - 03/12/20 1220      SLP LONG TERM GOAL #1   Title Patient will participate functionally in 15 minutes mod complex-complex conversation using compensations as necessary x 3 visits.    Time 8    Period Weeks    Status On-going      SLP LONG TERM GOAL #2   Title Patient will demonstrate auditory comprehension of 15 minutes mod complex content (conversation, podcast, radio program, etc) x 3 visits by answering detailed questions >90% accuracy.    Time 8    Period Weeks    Status On-going      SLP LONG TERM GOAL #3   Title Patient will report using accessibility features to read and reply to text messages x 3 visits.    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 Suspect visual deficits impacting reading; has OT eval next week. Pt is highly motivated, but has significant anxiety around making errors. SLP has noted repetition, simple rephrasing, and slower speech were helpful for pt's understanding. 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 Continues to report frustration with communication abilities. I recommend skilled ST to maximize communication and cognitive abilities to reduce frustration, improve language abilities, safety, and for possible return to work.    Speech Therapy Frequency 2x / week    Duration --   8 weeks or 17 visits   Treatment/Interventions Language facilitation;Environmental controls;Cueing hierarchy;SLP instruction and feedback;Cognitive reorganization;Compensatory techniques;Functional tasks;Compensatory strategies;Internal/external aids;Multimodal communcation approach;Patient/family education    Potential to Achieve Goals Good    SLP Home Exercise Plan provided aphasia handout and info re: [employer]           Patient will benefit from skilled therapeutic intervention in order to improve the following deficits and impairments:   Aphasia    Problem List Patient Active Problem List   Diagnosis Date Noted  . Aortic calcification (HCC) 02/22/2020  . Carotid artery calcification, bilateral 02/22/2020  . Depression   . GERD (gastroesophageal reflux disease)   . Ischemic stroke (HCC), occipital   . Diabetic polyneuropathy associated with type 2 diabetes mellitus (HCC) 06/30/2018  . Other chronic pain 06/30/2018  . Erectile dysfunction due to type 2 diabetes mellitus (HCC) 06/30/2018  . Circulation disorder of  lower extremity 05/31/2015  . IBS (irritable bowel syndrome) 12/24/2014  . 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 Diabetic leg ulcer (HCC) 07/18/2013  . Restless legs syndrome (RLS) 11/20/2012  . Generalized anxiety disorder   . Type 2 diabetes mellitus with vascular disease (HCC)   . Hyperlipidemia LDL goal <70 01/16/2009  . TOBACCO ABUSE 07/27/2007  . Morbid obesity (HCC) 09/29/2006  . Essential hypertension 09/29/2006  . CALCULUS, KIDNEY 05/30/2002   Rondel Baton, MS, CCC-SLP Speech-Language Pathologist  Arlana Lindau 03/12/2020, 12:21 PM   [facility] [address] [place], [place], [address] Phone: [phone]   Fax:  [phone]   Name: Davonta Stroot MRN: [id number] Date of Birth: Jan 09, 1961

## 2020-03-16 ENCOUNTER — Other Ambulatory Visit: Payer: Self-pay | Admitting: Endocrinology

## 2020-03-16 DIAGNOSIS — L97929 Non-pressure chronic ulcer of unspecified part of left lower leg with unspecified severity: Secondary | ICD-10-CM

## 2020-03-16 DIAGNOSIS — E11622 Type 2 diabetes mellitus with other skin ulcer: Secondary | ICD-10-CM

## 2020-03-17 ENCOUNTER — Ambulatory Visit: Payer: Managed Care, Other (non HMO) | Admitting: Occupational Therapy

## 2020-03-17 ENCOUNTER — Other Ambulatory Visit: Payer: Self-pay

## 2020-03-17 ENCOUNTER — Ambulatory Visit: Payer: Managed Care, Other (non HMO) | Admitting: Speech Pathology

## 2020-03-17 DIAGNOSIS — I69318 Other symptoms and signs involving cognitive functions following cerebral infarction: Secondary | ICD-10-CM

## 2020-03-17 DIAGNOSIS — R4701 Aphasia: Secondary | ICD-10-CM

## 2020-03-17 DIAGNOSIS — R278 Other lack of coordination: Secondary | ICD-10-CM

## 2020-03-17 DIAGNOSIS — H53461 Homonymous bilateral field defects, right side: Secondary | ICD-10-CM

## 2020-03-17 DIAGNOSIS — R41842 Visuospatial deficit: Secondary | ICD-10-CM

## 2020-03-17 DIAGNOSIS — R2681 Unsteadiness on feet: Secondary | ICD-10-CM

## 2020-03-17 NOTE — Therapy (Signed)
[facility] [address] [place], [place], [address] Phone: [phone]   Fax:  [phone]  Occupational Therapy Evaluation  Patient Details  Name: [name] [name] MRN: [id number] Date of Birth: [birth year] Referring Provider (OT): Dr. Roda Shutters   Encounter Date: 03/17/2020   OT End of Session - 03/17/20 1242    Visit Number 1    Number of Visits 16    Date for OT Re-Evaluation 05/17/20    Authorization Type Cigna/C[employer] managed    Authorization Time Period 60 days    OT Start Time 1015    OT Stop Time 1100    OT Time Calculation (min) 45 min    Activity Tolerance Patient tolerated treatment well;Other (comment)   Pt became tearful with challenging cognitive activity   Behavior During Therapy WFL for tasks assessed/performed           Past Medical History:  Diagnosis Date  . Angio-edema   . Anxiety   . Depression   . Diabetes mellitus type II    type 2  . GERD (gastroesophageal reflux disease)   . Headache    migraine  . History of kidney stones   . HLD (hyperlipidemia)   . HTN (hypertension)   . Morbid obesity (HCC)   . Restless legs syndrome (RLS) 11/20/2012  . Tobacco abuse   . Ulcer of left ankle (HCC)    last 2 months dime size dry dressing changing q day  . Urticaria     Past Surgical History:  Procedure Laterality Date  . Arm surgery Right 1969   Abscess excision and debridement at elbow  . COLONOSCOPY WITH PROPOFOL N/A 09/06/2016   Procedure: COLONOSCOPY WITH PROPOFOL;  Surgeon: Sherrilyn Rist, MD;  Location: [facility];  Service: Gastroenterology;  Laterality: N/A;  . I & D EXTREMITY Left 05/29/2014   Procedure: IRRIGATION AND DEBRIDEMENT OF LEFT LEG WOUND AND PLACEMENT OF  INTEGRA  AND  VAC;  Surgeon: Wayland Denis, DO;  Location: [facility];  Service: Plastics;  Laterality: Left;  . implantable loop recorder implant  03/06/2020   Medtronic Reveal [place] model LNQ11 (SN   [id number]) implanted by Dr Johney Frame for cryptogenic stroke  . INCISION AND DRAINAGE OF WOUND Left 04/23/2014   Procedure: IRRIGATION AND DEBRIDEMENT LOWER LEFT LEG WOUND WITH PLACEMENT OF INTEGRA AND VAC;  Surgeon: Wayland Denis, DO;  Location: [facility];  Service: Plastics;  Laterality: Left;  . kidney stone removal  2004  . MINOR APPLICATION OF WOUND VAC Left 04/23/2014   Procedure: MINOR APPLICATION OF WOUND VAC;  Surgeon: Wayland Denis, DO;  Location: [facility];  Service: Plastics;  Laterality: Left;  . SKIN SPLIT GRAFT Left 06/18/2014   Procedure: SKIN GRAFT SPLIT THICKNESS TO LOWER LEFT LEG WOUND WITH PLACEMENT OF VAC;  Surgeon: Wayland Denis, DO;  Location: [facility];  Service: Plastics;  Laterality: Left;  Marland Kitchen VASECTOMY  1994    There were no vitals filed for this visit.   Subjective Assessment - 03/17/20 1018    Patient is accompanied by: Family member   wife [name])   Pertinent History CVA 02/14/20 Lt occipital lobe. PMH: 2 small previous strokes, DM, GERD, HLD, HTN (but now has issues w/ orthostasis)    Limitations no driving, loop recorder    Currently in Pain? No/denies             [facility] OT Assessment - 03/17/20 0001  Assessment   Medical Diagnosis CVA    Referring Provider (OT) Dr. Roda Shutters    Onset Date/Surgical Date 02/14/20    Hand Dominance Right    Prior Therapy inpatient      Precautions   Precautions Fall    Precaution Comments loop recorder, no driving      Restrictions   Weight Bearing Restrictions No      Balance Screen   Has the patient fallen in the past 6 months Yes    How many times? 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   Leisure movies, antique shopping      ADL   Eating/Feeding Modified independent    Grooming --   assist for shaving, otherwise independent   Upper Body Bathing Supervision/safety    Lower Body Bathing Maximal assistance   due to balance   Upper Body Dressing Increased time    Lower Body Dressing Increased time    Animator -  Hygiene Independent    Tub/Shower Transfer Minimal assistance      IADL   Shopping Needs to be accompanied on any shopping trip   pt accompanies for exercise   Light Housekeeping Performs light daily tasks such as dishwashing, bed making   moving clothes from washer to dryer   Meal Prep Able to complete simple warm meal prep;Able to complete simple cold meal and snack prep   doing cooking together, no oven or knife use. Pt did mostly prior to stroke   Community Mobility Relies on family or friends for transportation    Financial Management --   currently doing together, but pt did prior to stroke     Mobility   Mobility Status Comments walks w/ cane in community, wall/furniture walker in home      Written Expression   Dominant Hand Right      Vision - History   Baseline Vision Wears glasses all the time      Vision Assessment   Visual Fields Right homonymous Hemianopsia   vs. Rt visual field cut   Comment Pt reports diplopia has resolved. Pt also reports difficulty with glare and bright lighting      Cognition   Overall Cognitive Status Impaired/Different from baseline    Cognition Comments Delayed recall 2/3 items, subtract by 7's w/ extra time and 1 error. Pt with decreased processing speed, word finding difficulties, and wife also reports deficits in executive function. Will further assess in functional context      Observation/Other Assessments   Observations old RTC tear Rt shoulder with weakness in abduction      Sensation    Additional Comments neuropathy both hands and feet from DM exacerbated by stroke      Coordination   9 Hole Peg Test Right;Left    Right 9 Hole Peg Test 33.91 sec    Left 9 Hole Peg Test 26.43 sec      ROM / Strength   AROM / PROM / Strength AROM;Strength  AROM   Overall AROM Comments BUE AROM WNL's      Strength   Overall Strength Comments BUE MMT grossly 5/5 except Rt shoulder flex 4/5, abduction 3+/5 due to old RTC injury      Hand Function   Right Hand Grip (lbs) 64.3 lbs    Left Hand Grip (lbs) 62.1 lbs                             OT Short Term Goals - 03/17/20 1447      OT SHORT TERM GOAL #1   Title Pt will be independent with HEP for Rt hand coordination and Rt shoulder (low range strengthening)    Time 4    Period Weeks    Status New      OT SHORT TERM GOAL #2   Title Pt will be independent with visual scanning strategies to compensate for Rt visual field cut    Time 4    Period Weeks    Status New      OT SHORT TERM GOAL #3   Title Improve coordination Rt hand to 28 sec or less for high level tasks    Baseline 33.91 sec    Time 4    Period Weeks    Status New      OT SHORT TERM GOAL #4   Title Pt will verbalize understanding with memory compensatory strategies and utilize for medication and financial management    Time 4    Period Weeks    Status New      OT SHORT TERM GOAL #5   Title Pt will perform simple financial management tasks using calculator prn with supervision and no more than min v.c's    Time 4    Period Weeks      Additional Short Term Goals   Additional Short Term Goals Yes      OT SHORT TERM GOAL #6   Title Pt will perform simple stovetop task with supervision only    Time 4    Period Weeks    Status New      OT SHORT TERM GOAL #7   Title Pt will perform tabletop scanning with 100% accuracy and environmental scanning with 75% or greater accuracy    Time 4    Period Weeks    Status New              OT Long Term Goals - 03/17/20 1449      OT LONG TERM GOAL #1   Title Pt to return to cooking with distant supervision    Time 8    Period Weeks    Status New      OT LONG TERM GOAL #2   Title Pt will complete meal plan for the week and develop corresponding grocery list I'ly    Time 8    Period Weeks    Status New      OT LONG TERM GOAL #3   Title Pt will perform full financial management tasks at 100% accuracy with no cues    Time 8    Period Weeks    Status New      OT LONG TERM GOAL #4   Title Pt will demo organizational and problem solving skills to complete and adapt a schedule prn    Time 8    Period Weeks    Status New      OT LONG TERM  GOAL #5   Title Pt will perform environmental scanning with 90% or greater accuracy    Time 8    Period Weeks    Status New                 Plan - 03/17/20 1245    Clinical Impression Statement Pt is a 59 y.o. male who presents to OPOT s/p CVA (Lt occipital and parietal lobes) on 02/14/20. Pt was independent and working full time prior to stroke. Pt also enjoyed cooking. He presents today with Rt visual field cut, mild coordination deficits Rt hand, mild balance deficits, and cognitive deficits including decreased executive functioning, memory, and processing. Pt also with word finding difficulties. Pt would benefit from O.T. to address these deficits and increase safety and independence with ADLS and possible return to work.    OT Occupational Profile and History Detailed Assessment- Review of Records and additional review of physical, cognitive, psychosocial history related to current functional performance    Occupational performance deficits (Please refer to evaluation for details): ADL's;IADL's;Work;Leisure    Body Structure / Function / Physical Skills ADL;Strength;Balance;UE functional use;IADL;Vision;Coordination;Mobility;Sensation;FMC    Cognitive Skills Memory;Perception;Problem Solve;Sequencing    Rehab Potential Good     Clinical Decision Making Several treatment options, min-mod task modification necessary    Comorbidities Affecting Occupational Performance: May have comorbidities impacting occupational performance    Modification or Assistance to Complete Evaluation  Min-Moderate modification of tasks or assist with assess necessary to complete eval    OT Frequency 2x / week    OT Duration 8 weeks   plus eval   OT Treatment/Interventions Self-care/ADL training;DME and/or AE instruction;Therapeutic activities;Therapeutic exercise;Cognitive remediation/compensation;Coping strategies training;Functional Mobility Training;Neuromuscular education;Visual/perceptual remediation/compensation;Patient/family education    Plan high level coordination HEP for Rt hand, visual scanning strategies    Consulted and Agree with Plan of Care Patient;Family member/caregiver    Family Member Consulted wife           Patient will benefit from skilled therapeutic intervention in order to improve the following deficits and impairments:   Body Structure / Function / Physical Skills: ADL, Strength, Balance, UE functional use, IADL, Vision, Coordination, Mobility, Sensation, [facility] Cognitive Skills: Memory, Perception, Problem Solve, Sequencing     Visit Diagnosis: Other symptoms and signs involving cognitive functions following cerebral infarction  Visuospatial deficit  Other lack of coordination  Unsteadiness on feet  Right homonymous hemianopsia    Problem List Patient Active Problem List   Diagnosis Date Noted  . Aortic calcification (HCC) 02/22/2020  . Carotid artery calcification, bilateral 02/22/2020  . Depression   . GERD (gastroesophageal reflux disease)   . Ischemic stroke (HCC), occipital   . Diabetic polyneuropathy associated with type 2 diabetes mellitus (HCC) 06/30/2018  . Other chronic pain 06/30/2018  . Erectile dysfunction due to type 2 diabetes mellitus (HCC) 06/30/2018  . Circulation disorder of  lower extremity 05/31/2015  . IBS (irritable bowel syndrome) 12/24/2014  . 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 Diabetic leg ulcer (HCC) 07/18/2013  . Restless legs syndrome (RLS) 11/20/2012  . Generalized anxiety disorder   . Type 2 diabetes mellitus with vascular disease (HCC)   . Hyperlipidemia LDL goal <70 01/16/2009  . TOBACCO ABUSE 07/27/2007  . Morbid obesity (HCC) 09/29/2006  . Essential hypertension 09/29/2006  . CALCULUS, KIDNEY 05/30/2002    Kelli Churn, OTR/L 03/17/2020, 2:52 PM  [facility] [address] [place], [place],  [id number] Phone: [phone]   Fax:  [phone]  Name: Joen LauraHerbert Nigg MRN: [id number] Date of Birth: 03/18/1961

## 2020-03-17 NOTE — Therapy (Signed)
[facility] [address] [place], [place], [address] Phone: [phone]   Fax:  [phone]  Speech Language Pathology Treatment  Patient Details  Name: [name] [name] MRN: [id number] Date of Birth: 12-06-1960 Referring Provider (SLP): Osvaldo Shipper (referring); Copland, Karleen Hampshire (PCP/documentation)   Encounter Date: 03/17/2020   End of Session - 03/17/20 1339    Visit Number 4    Number of Visits 17    Date for SLP Re-Evaluation 05/02/20    SLP Start Time 1104    SLP Stop Time  1147    SLP Time Calculation (min) 43 min    Activity Tolerance Patient tolerated treatment well           Past Medical History:  Diagnosis Date  . Angio-edema   . Anxiety   . Depression   . Diabetes mellitus type II    type 2  . GERD (gastroesophageal reflux disease)   . Headache    migraine  . History of kidney stones   . HLD (hyperlipidemia)   . HTN (hypertension)   . Morbid obesity (HCC)   . Restless legs syndrome (RLS) 11/20/2012  . Tobacco abuse   . Ulcer of left ankle (HCC)    last 2 months dime size dry dressing changing q day  . Urticaria     Past Surgical History:  Procedure Laterality Date  . Arm surgery Right 1969   Abscess excision and debridement at elbow  . COLONOSCOPY WITH PROPOFOL N/A 09/06/2016   Procedure: COLONOSCOPY WITH PROPOFOL;  Surgeon: Sherrilyn Rist, MD;  Location: [facility];  Service: Gastroenterology;  Laterality: N/A;  . I & D EXTREMITY Left 05/29/2014   Procedure: IRRIGATION AND DEBRIDEMENT OF LEFT LEG WOUND AND PLACEMENT OF  INTEGRA  AND  VAC;  Surgeon: Wayland Denis, DO;  Location: [facility];  Service: Plastics;  Laterality: Left;  . implantable loop recorder implant  03/06/2020   Medtronic Reveal [place] model LNQ11 (SN  [id number]) implanted by Dr Johney Frame for cryptogenic stroke  . INCISION AND DRAINAGE OF WOUND Left 04/23/2014   Procedure: IRRIGATION AND DEBRIDEMENT LOWER  LEFT LEG WOUND WITH PLACEMENT OF INTEGRA AND VAC;  Surgeon: Wayland Denis, DO;  Location: [facility];  Service: Plastics;  Laterality: Left;  . kidney stone removal  2004  . MINOR APPLICATION OF WOUND VAC Left 04/23/2014   Procedure: MINOR APPLICATION OF WOUND VAC;  Surgeon: Wayland Denis, DO;  Location: [facility];  Service: Plastics;  Laterality: Left;  . SKIN SPLIT GRAFT Left 06/18/2014   Procedure: SKIN GRAFT SPLIT THICKNESS TO LOWER LEFT LEG WOUND WITH PLACEMENT OF VAC;  Surgeon: Wayland Denis, DO;  Location: [facility];  Service: Plastics;  Laterality: Left;  Marland Kitchen VASECTOMY  1994    There were no vitals filed for this visit.   Subjective Assessment - 03/17/20 1332    Subjective Pt shows SLP new tablet and notebook where he has been practicing speech/writing at home.    Patient is accompained by: Family member   wife stayed for 5 minutes   Currently in Pain? No/denies                 ADULT SLP TREATMENT - 03/17/20 1332      General Information   Behavior/Cognition Alert;Cooperative;Pleasant mood      Treatment Provided   Treatment provided Cognitive-Linquistic      Pain Assessment   Pain Assessment No/denies pain  Cognitive-Linquistic Treatment   Treatment focused on Aphasia;Patient/family/caregiver education    Skilled Treatment Wife wondered if pt might "do better" if she was not present; SLP suggested wife could consider allowing pt to attend some sessions alone, but also encouraged her to join periodically to learn strategies to assist pt. Pt has been working on writing words and sentences at home; saving these on his new tablet. Requested some additional tasks for TalkPath Therapy. He has been getting p/b/d/g confused when writing lowercase letters. Has more success with writing uppercase. Suggested pt write first in uppercase, then write in lowercase. Targeted naming and sentence-level writing today with VNEST; pt generated  subjects/objects for given verb with occasional min-mod cues (somewhat anxious; able to reduce cuing once pt initiated task). Pt generated 5 sentences with extended time, mod cues for correcting spelling errors (trackor/tractor, evevywher/everywhere).      Assessment / Recommendations / Plan   Plan Continue with current plan of care      Progression Toward Goals   Progression toward goals Progressing toward goals            SLP Education - 03/17/20 1339    Education Details VNEST procedure, naming tasks for home    Person(s) Educated Patient    Methods Explanation;Handout    Comprehension Verbalized understanding            SLP Short Term Goals - 03/17/20 1340      SLP SHORT TERM GOAL #1   Title Patient will participate functionally in 8 minutes simple-mod complex conversation using compensations for anomia as necessary, x3 visits.    Time 3    Period Weeks    Status On-going      SLP SHORT TERM GOAL #2   Title Pt will demonstrate auditory comprehension of 5 minutes simple-mod complex material by answering WH questions >90% accuracy x 2 sessions.    Time 3    Period Weeks    Status On-going      SLP SHORT TERM GOAL #3   Title Pt will participate in assessment of reading, writing and cognition with goals added PRN    Time 3    Period Weeks    Status On-going            SLP Long Term Goals - 03/17/20 1341      SLP LONG TERM GOAL #1   Title Patient will participate functionally in 15 minutes mod complex-complex conversation using compensations as necessary x 3 visits.    Time 7    Period Weeks    Status On-going      SLP LONG TERM GOAL #2   Title Patient will demonstrate auditory comprehension of 15 minutes mod complex content (conversation, podcast, radio program, etc) x 3 visits by answering detailed questions >90% accuracy.    Time 7    Period Weeks    Status On-going      SLP LONG TERM GOAL #3   Title Patient will report using accessibility features to  read and reply to text messages x 3 visits.    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 Pt is highly motivated, but has significant anxiety  around making errors. 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 Continues to report frustration with communication abilities. I recommend skilled ST to maximize communication and cognitive abilities to reduce frustration, improve language abilities, safety, and for possible return to work.    Speech Therapy Frequency 2x / week    Duration --   8 weeks or 17 visits   Treatment/Interventions Language facilitation;Environmental controls;Cueing hierarchy;SLP instruction and feedback;Cognitive reorganization;Compensatory techniques;Functional tasks;Compensatory strategies;Internal/external aids;Multimodal communcation approach;Patient/family education    Potential to Achieve Goals Good    SLP Home Exercise Plan provided aphasia handout and info re: Constant Therapy           Patient will benefit from skilled therapeutic intervention in order to improve the following deficits and impairments:   Aphasia    Problem List Patient Active Problem List   Diagnosis Date Noted  . Aortic calcification (HCC) 02/22/2020  . Carotid artery calcification, bilateral 02/22/2020  . Depression   . GERD (gastroesophageal reflux disease)   . Ischemic stroke (HCC), occipital   . Diabetic polyneuropathy associated with type 2 diabetes mellitus (HCC) 06/30/2018  . Other chronic pain 06/30/2018  . Erectile dysfunction due to type 2 diabetes mellitus (HCC) 06/30/2018  . Circulation disorder of lower extremity  05/31/2015  . IBS (irritable bowel syndrome) 12/24/2014  . 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 Diabetic leg ulcer (HCC) 07/18/2013  . Restless legs syndrome (RLS) 11/20/2012  . Generalized anxiety disorder   . Type 2 diabetes mellitus with vascular disease (HCC)   . Hyperlipidemia LDL goal <70 01/16/2009  . TOBACCO ABUSE 07/27/2007  . Morbid obesity (HCC) 09/29/2006  . Essential hypertension 09/29/2006  . CALCULUS, KIDNEY 05/30/2002   Rondel Baton, MS, CCC-SLP Speech-Language Pathologist   Arlana Lindau 03/17/2020, 1:41 PM  [facility] [address] [place], [place], [address] Phone: [phone]   Fax:  [phone]   Name: [name] [name] MRN: [id number] Date of Birth: 04-Mar-1961

## 2020-03-18 LAB — HM DIABETES EYE EXAM

## 2020-03-18 NOTE — Progress Notes (Signed)
prior authorization submitted via covermymeds.  Waiting response. Key: [id number] - PA Case ID: [id number] Need help? Call us at [phone] Status Sent to [employer] Trulicity 3MG /0.5ML pen-injectors Form [employer] Electronic Prior Authorization Form [phone] NCPDP)

## 2020-03-19 ENCOUNTER — Ambulatory Visit: Payer: Managed Care, Other (non HMO) | Admitting: Speech Pathology

## 2020-03-19 ENCOUNTER — Ambulatory Visit: Payer: Managed Care, Other (non HMO) | Admitting: Physical Therapy

## 2020-03-25 ENCOUNTER — Ambulatory Visit: Payer: Managed Care, Other (non HMO) | Admitting: Occupational Therapy

## 2020-03-25 ENCOUNTER — Other Ambulatory Visit: Payer: Self-pay

## 2020-03-25 ENCOUNTER — Ambulatory Visit: Payer: Managed Care, Other (non HMO)

## 2020-03-25 DIAGNOSIS — R4701 Aphasia: Secondary | ICD-10-CM | POA: Diagnosis not present

## 2020-03-25 DIAGNOSIS — R41842 Visuospatial deficit: Secondary | ICD-10-CM

## 2020-03-25 DIAGNOSIS — R2689 Other abnormalities of gait and mobility: Secondary | ICD-10-CM

## 2020-03-25 DIAGNOSIS — I69318 Other symptoms and signs involving cognitive functions following cerebral infarction: Secondary | ICD-10-CM

## 2020-03-25 DIAGNOSIS — R278 Other lack of coordination: Secondary | ICD-10-CM

## 2020-03-25 DIAGNOSIS — M6281 Muscle weakness (generalized): Secondary | ICD-10-CM

## 2020-03-25 DIAGNOSIS — R2681 Unsteadiness on feet: Secondary | ICD-10-CM

## 2020-03-25 NOTE — Therapy (Signed)
[facility] [address] [place], [place], [address] Phone: [phone]   Fax:  [phone]  Physical Therapy Treatment  Patient Details  Name: [name] LauraHerbert [name] MRN: [id number] Date of Birth: 10/03/1960 Referring Provider (PT): Dr. Dallas Schimkeopeland   Encounter Date: 03/25/2020   PT End of Session - 03/25/20 1111    Visit Number 4    Number of Visits 9    Date for PT Re-Evaluation 04/21/20    Authorization Type Medicare    Progress Note Due on Visit 9    PT Start Time 1104    PT Stop Time 1146    PT Time Calculation (min) 42 min    Equipment Utilized During Treatment Gait belt    Activity Tolerance Patient tolerated treatment well    Behavior During Therapy [facility] for tasks assessed/performed           Past Medical History:  Diagnosis Date  . Angio-edema   . Anxiety   . Depression   . Diabetes mellitus type II    type 2  . GERD (gastroesophageal reflux disease)   . Headache    migraine  . History of kidney stones   . HLD (hyperlipidemia)   . HTN (hypertension)   . Morbid obesity (HCC)   . Restless legs syndrome (RLS) 11/20/2012  . Tobacco abuse   . Ulcer of left ankle (HCC)    last 2 months dime size dry dressing changing q day  . Urticaria     Past Surgical History:  Procedure Laterality Date  . Arm surgery Right 1969   Abscess excision and debridement at elbow  . COLONOSCOPY WITH PROPOFOL N/A 09/06/2016   Procedure: COLONOSCOPY WITH PROPOFOL;  Surgeon: Sherrilyn RistHenry L Danis III, MD;  Location: [facility];  Service: Gastroenterology;  Laterality: N/A;  . I & D EXTREMITY Left 05/29/2014   Procedure: IRRIGATION AND DEBRIDEMENT OF LEFT LEG WOUND AND PLACEMENT OF  INTEGRA  AND  VAC;  Surgeon: Wayland Denislaire Sanger, DO;  Location: [facility];  Service: Plastics;  Laterality: Left;  . implantable loop recorder implant  03/06/2020   Medtronic Reveal [place] model LNQ11 (SN  [id number]) implanted by Dr Johney FrameAllred for  cryptogenic stroke  . INCISION AND DRAINAGE OF WOUND Left 04/23/2014   Procedure: IRRIGATION AND DEBRIDEMENT LOWER LEFT LEG WOUND WITH PLACEMENT OF INTEGRA AND VAC;  Surgeon: Wayland Denislaire Sanger, DO;  Location: [facility];  Service: Plastics;  Laterality: Left;  . kidney stone removal  2004  . MINOR APPLICATION OF WOUND VAC Left 04/23/2014   Procedure: MINOR APPLICATION OF WOUND VAC;  Surgeon: Wayland Denislaire Sanger, DO;  Location: [facility];  Service: Plastics;  Laterality: Left;  . SKIN SPLIT GRAFT Left 06/18/2014   Procedure: SKIN GRAFT SPLIT THICKNESS TO LOWER LEFT LEG WOUND WITH PLACEMENT OF VAC;  Surgeon: Wayland Denislaire Sanger, DO;  Location: [facility];  Service: Plastics;  Laterality: Left;  Marland Kitchen. VASECTOMY  1994    There were no vitals filed for this visit.   Subjective Assessment - 03/25/20 1108    Subjective Patient reports that his blood sugars are doing really well. Had a good birthday yesterday. Reports no falls.    Patient is accompained by: Family member   Wife, [name] [name]   Pertinent History hypertension, hyperlipidemia, type 2 diabetes mellitus, restless leg syndrome, diabetic neuropathy, morbid obesity, tobacco abuse, depression/anxiety, GERD, chronic venous stasis ulcer in bilateral lower extremities    Limitations Reading;Walking;House hold activities  How long can you sit comfortably? no issues    How long can you stand comfortably? 10 min    How long can you walk comfortably? 5 min    Diagnostic tests CT head showed Hyperdense right MCA.  CTA head and neck and perfusion study showed 10 cc region of completed infarction in left occipital cortex and additional 11 cc at risk/penumbra. [name] (wife) reports that short term memory is affected.    Patient Stated Goals improve walking    Currently in Pain? Yes    Pain Score 2     Pain Location Foot    Pain Orientation Right;Left    Pain Descriptors / Indicators Pins and needles    Pain Type Neuropathic pain     Pain Onset More than a month ago    Pain Frequency Intermittent                             [facility] Adult PT Treatment/Exercise - 03/25/20 0001      Transfers   Transfers Sit to Stand;Stand to Sit    Sit to Stand 6: Modified independent (Device/Increase time)    Stand to Sit 6: Modified independent (Device/Increase time)    Comments x 5 reps throughout completion of activities       Ambulation/Gait   Ambulation/Gait Yes    Ambulation/Gait Assistance 5: Supervision    Ambulation/Gait Assistance Details Patient completed ambulation outdoors with SPC x 12 minutes, patient require 2-3 instances of stopping due to pain in B hip/low back. After completion, RPE: 7/10, SOB: 3/10. Patient HR: 78 bpm. Extended rest break required due to fatigue.     Ambulation Distance (Feet) --   12 minutes   Assistive device Straight cane    Gait Pattern Step-through pattern    Ambulation Surface Unlevel;Outdoor;Paved      Exercises   Exercises Other Exercises    Other Exercises  Reviewed step up exercise on HEP with patient, as wife reporting stairs are narrow and is dififuclt to complete. PT educating on options for completion. Will continue to assess and provide alternative exercises if needed.                Balance Exercises - 03/25/20 0001      Balance Exercises: Standing   Standing Eyes Opened Narrow base of support (BOS);Head turns;Foam/compliant surface;Limitations    Standing Eyes Opened Limitations completed 1 x 15 reps of horizontal/vertical head turns with narrow BOS    Standing Eyes Closed Narrow base of support (BOS);Foam/compliant surface;3 reps;30 secs;Limitations    Tandem Stance Eyes open;Intermittent upper extremity support;2 reps;30 secs;Limitations    Tandem Stance Time completed 2 x 30 secs bilaterally. progressed foot positon from partial tandem to tandem due to improved balance.     Other Standing Exercises Comments intermittent rest breaks required  throughout.              PT Education - 03/25/20 1152    Education Details Educated on progress toward STGs; progression of Tandem Stance on HEP    Person(s) Educated Patient;Spouse    Methods Explanation;Demonstration    Comprehension Verbalized understanding;Returned demonstration            PT Short Term Goals - 03/25/20 1131      PT SHORT TERM GOAL #1   Title patient will be able to walk with or without AD for 10 min to improve walking endurance    Baseline <5 min  without AD, 12 minutes with SPC    Time 4    Period Weeks    Status Achieved    Target Date 03/24/20             PT Long Term Goals - 02/25/20 1933      PT LONG TERM GOAL #1   Title Pt will be able to ambulate for 30 min with or without AD to improve walking endurance    Baseline <5 min without AD    Time 8    Period Weeks    Status New    Target Date 04/21/20      PT LONG TERM GOAL #2   Title Pt will demo 12 sec or less with 5x/ sit to stand without HHA to improve functional strength    Baseline 15 sec without HHA    Time 8    Period Weeks    Status New    Target Date 04/21/20                 Plan - 03/25/20 1153    Clinical Impression Statement Today's skilled PT session included assesment of patient's progress toward STGs. Patient able to meet all STG today. Patient demonstrating improved endurance with ability to ambulate x 12 minutes with AD today. Continued balance exercises and progressed as tolerated. Will continue to progress toward all LTGs.    Personal Factors and Comorbidities Comorbidity 3+;Time since onset of injury/illness/exacerbation    Comorbidities hypertension, hyperlipidemia, type 2 diabetes mellitus, restless leg syndrome, diabetic neuropathy, morbid obesity, tobacco abuse, depression/anxiety, GERD, chronic venous stasis ulcer in bilateral lower extremities    Examination-Activity Limitations Squat;Stand;Stairs    Examination-Participation Restrictions Community  Activity;Yard Work;Shop    Rehab Potential Good    PT Frequency 1x / week    PT Duration 8 weeks    PT Treatment/Interventions ADLs/Self Care Home Management;Gait training;Stair training;Functional mobility training;Therapeutic activities;Therapeutic exercise;Balance training;Neuromuscular re-education;Patient/family education;Manual techniques;Passive range of motion;Energy conservation;Joint Manipulations    PT Next Visit Plan How was step up? do we need to change this exercise. SciFit/NuStep for Endurance. continue exercises for BLE strengthening. continue exercises to promote endurance. Balance Exercises (complaint surfaces).    PT Home Exercise Plan Walking program: walking with family member for 5 min and gradually progressing it to 30 min    Consulted and Agree with Plan of Care Patient;Family member/caregiver    Family Member Consulted Wife [name])           Patient will benefit from skilled therapeutic intervention in order to improve the following deficits and impairments:  Abnormal gait, Decreased activity tolerance, Decreased mobility, Decreased endurance, Difficulty walking, Impaired sensation, Impaired vision/preception, Postural dysfunction  Visit Diagnosis: Unsteadiness on feet  Muscle weakness (generalized)  Other abnormalities of gait and mobility     Problem List Patient Active Problem List   Diagnosis Date Noted  . Aortic calcification (HCC) 02/22/2020  . Carotid artery calcification, bilateral 02/22/2020  . Depression   . GERD (gastroesophageal reflux disease)   . Ischemic stroke (HCC), occipital   . Diabetic polyneuropathy associated with type 2 diabetes mellitus (HCC) 06/30/2018  . Other chronic pain 06/30/2018  . Erectile dysfunction due to type 2 diabetes mellitus (HCC) 06/30/2018  . Circulation disorder of lower extremity 05/31/2015  . IBS (irritable bowel syndrome) 12/24/2014  . 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  Diabetic leg ulcer (HCC) 07/18/2013  . Restless legs syndrome (RLS) 11/20/2012  . Generalized  anxiety disorder   . Type 2 diabetes mellitus with vascular disease (HCC)   . Hyperlipidemia LDL goal <70 01/16/2009  . TOBACCO ABUSE 07/27/2007  . Morbid obesity (HCC) 09/29/2006  . Essential hypertension 09/29/2006  . CALCULUS, KIDNEY 05/30/2002    Tempie Donning, PT, DPT 03/25/2020, 12:00 PM  [facility] [address] [place], [place], [address] Phone: [phone]   Fax:  [phone]  Name: [name] [name] MRN: [id number] Date of Birth: [birth year]

## 2020-03-25 NOTE — Patient Instructions (Signed)
VISUAL SCANNING STRATEGIES  1. Look for the edge of objects (to the left and/or right) so that you make sure you are seeing all of an object 2. Turn your head when walking, scan from side to side, particularly in busy environments 3. Use an organized scanning pattern. It's usually easier to scan from top to bottom, and left to right (like you are reading) 4. Double check yourself 5. Use a line guide (like a blank piece of paper) or your finger when reading 6. If necessary, place brightly colored tape at end of table or work area as a reminder to always look until you see the tape.    Activities to try at home to encourage visual scanning:   1. Word searches 2. Mazes 3. Puzzles 4. Card games 5. Computer games and/or searches 6. Connect-the-dots  Activities for environmental (larger) scanning:  1. With supervision, scan for items in grocery store or drugstore.  Begin with a familiar store, then progress to a new store you've never been in before. Make sure you have supervision with this.  2. With supervision, tell a family member or caregiver when it is safe to cross a street after looking all directions and any side streets. However, do NOT cross street unless family member or caregiver is with you and says it is OK    Coordination Activities  Perform the following activities for 10 minutes 1-2 times per day with right hand(s).   Rotate ball in fingertips (clockwise and counter-clockwise x 3 revolutions each way).  Toss ball in air and catch with the same hand.  Deal cards with your thumb (Hold deck in hand and push card off top with thumb).  Rotate one card in hand (clockwise and counter-clockwise).  Pick up coins one at a time until you get 5 in your hand, then move coins from palm to fingertips to stack one at a time.  Practice writing and typing.

## 2020-03-25 NOTE — Therapy (Signed)
[facility] [address] [place], [place], [address] Phone: [phone]   Fax:  [phone]  Occupational Therapy Treatment  Patient Details  Name: [name] [name] MRN: [id number] Date of Birth: 1960-06-27 Referring Provider (OT): Dr. Erlinda Hong   Encounter Date: 03/25/2020   OT End of Session - 03/25/20 1240    Visit Number 2    Number of Visits 16    Date for OT Re-Evaluation 05/17/20    Authorization Type Cigna/C[employer]a managed    Authorization Time Period 60 days    OT Start Time 1015    OT Stop Time 1104    OT Time Calculation (min) 49 min    Activity Tolerance Patient tolerated treatment well;Other (comment)   Pt became tearful with challenging cognitive activity   Behavior During Therapy WFL for tasks assessed/performed           Past Medical History:  Diagnosis Date  . Angio-edema   . Anxiety   . Depression   . Diabetes mellitus type II    type 2  . GERD (gastroesophageal reflux disease)   . Headache    migraine  . History of kidney stones   . HLD (hyperlipidemia)   . HTN (hypertension)   . Morbid obesity ([facility])   . Restless legs syndrome (RLS) 11/20/2012  . Tobacco abuse   . Ulcer of left ankle (HCC)    last 2 months dime size dry dressing changing q day  . Urticaria     Past Surgical History:  Procedure Laterality Date  . Arm surgery Right 1969   Abscess excision and debridement at elbow  . COLONOSCOPY WITH PROPOFOL N/A 09/06/2016   Procedure: COLONOSCOPY WITH PROPOFOL;  Surgeon: Doran Stabler, MD;  Location: [facility];  Service: Gastroenterology;  Laterality: N/A;  . I & D EXTREMITY Left 05/29/2014   Procedure: IRRIGATION AND DEBRIDEMENT OF LEFT LEG WOUND AND PLACEMENT OF  INTEGRA  AND  VAC;  Surgeon: Theodoro Kos, DO;  Location: [facility];  Service: Plastics;  Laterality: Left;  . implantable loop recorder implant  03/06/2020   Medtronic Reveal [place] model LNQ11 (SN   [id number]) implanted by Dr Rayann Heman for cryptogenic stroke  . INCISION AND DRAINAGE OF WOUND Left 04/23/2014   Procedure: IRRIGATION AND DEBRIDEMENT LOWER LEFT LEG WOUND WITH PLACEMENT OF INTEGRA AND VAC;  Surgeon: Theodoro Kos, DO;  Location: [facility];  Service: Plastics;  Laterality: Left;  . kidney stone removal  2004  . MINOR APPLICATION OF WOUND VAC Left 04/23/2014   Procedure: MINOR APPLICATION OF WOUND VAC;  Surgeon: Theodoro Kos, DO;  Location: [facility];  Service: Plastics;  Laterality: Left;  . SKIN SPLIT GRAFT Left 06/18/2014   Procedure: SKIN GRAFT SPLIT THICKNESS TO LOWER LEFT LEG WOUND WITH PLACEMENT OF VAC;  Surgeon: Theodoro Kos, DO;  Location: [facility];  Service: Plastics;  Laterality: Left;  Marland Kitchen VASECTOMY  1994    There were no vitals filed for this visit.   Subjective Assessment - 03/25/20 1021    Subjective  I just had a birthday    Patient is accompanied by: Family member   wife [name])   Pertinent History CVA 02/14/20 Lt occipital lobe. PMH: 2 small previous strokes, DM, GERD, HLD, HTN (but now has issues w/ orthostasis)    Limitations no driving, loop recorder    Currently in Pain? No/denies           Pt issued  visual scanning strategies and discussed. Also encouraged games/activities at home to encourage this and for community/larger environment scanning in grocery store, etc. Pt/family given recommendations for searching for items in grocery store.  Also issued coordination HEP for higher level tasks - pt had very little difficulty with this.  Pt/wife went into further details for job requirements as pt's wife was concerned about his ability to return to work in the near future. Pt is an Passenger transport manager for [employer] and requires processing a lot of information, and also making sure demographics and other info is entered correctly for billing purposes, as well as adjusting bill prn. Pt requires skills/ability to recognize errors,  process information, decision making, problem solving, etc.                      OT Education - 03/25/20 1030    Education Details Visual scanning strategies and ways to improve scanning for home/community, coordination HEP    Person(s) Educated Patient;Spouse    Methods Explanation;Demonstration;Verbal cues;Handout    Comprehension Verbalized understanding;Returned demonstration            OT Short Term Goals - 03/25/20 1241      OT SHORT TERM GOAL #1   Title Pt will be independent with HEP for Rt hand coordination and Rt shoulder (low range strengthening)    Time 4    Period Weeks    Status On-going   MET for coordination     OT SHORT TERM GOAL #2   Title Pt will be independent with visual scanning strategies to compensate for Rt visual field cut    Time 4    Period Weeks    Status On-going      OT SHORT TERM GOAL #3   Title Improve coordination Rt hand to 28 sec or less for high level tasks    Baseline 33.91 sec    Time 4    Period Weeks    Status New      OT SHORT TERM GOAL #4   Title Pt will verbalize understanding with memory compensatory strategies and utilize for medication and financial management    Time 4    Period Weeks    Status New      OT SHORT TERM GOAL #5   Title Pt will perform simple financial management tasks using calculator prn with supervision and no more than min v.c's    Time 4    Period Weeks      OT SHORT TERM GOAL #6   Title Pt will perform simple stovetop task with supervision only    Time 4    Period Weeks    Status New      OT SHORT TERM GOAL #7   Title Pt will perform tabletop scanning with 100% accuracy and environmental scanning with 75% or greater accuracy    Time 4    Period Weeks    Status New             OT Long Term Goals - 03/17/20 1449      OT LONG TERM GOAL #1   Title Pt to return to cooking with distant supervision    Time 8    Period Weeks    Status New      OT LONG TERM GOAL #2   Title  Pt will complete meal plan for the week and develop corresponding grocery list I'ly    Time 8    Period Weeks  Status New      OT LONG TERM GOAL #3   Title Pt will perform full financial management tasks at 100% accuracy with no cues    Time 8    Period Weeks    Status New      OT LONG TERM GOAL #4   Title Pt will demo organizational and problem solving skills to complete and adapt a schedule prn    Time 8    Period Weeks    Status New      OT LONG TERM GOAL #5   Title Pt will perform environmental scanning with 90% or greater accuracy    Time 8    Period Weeks    Status New                 Plan - 03/25/20 1241    Clinical Impression Statement Wife reports pt w/ anxiety and easily discouraged w/ cognitive tasks. Pt/family concerned about necessary skills for return to work including: decision making, recognizing errors, problem solving, multi-step tasks as pt has to process information and audit prn. Discussed how usually short term disability would last 6 months so there is time to work on this.    OT Occupational Profile and History Detailed Assessment- Review of Records and additional review of physical, cognitive, psychosocial history related to current functional performance    Occupational performance deficits (Please refer to evaluation for details): ADL's;IADL's;Work;Leisure    Body Structure / Function / Physical Skills ADL;Strength;Balance;UE functional use;IADL;Vision;Coordination;Mobility;Sensation;FMC    Cognitive Skills Memory;Perception;Problem Solve;Sequencing    Rehab Potential Good    Clinical Decision Making Several treatment options, min-mod task modification necessary    Comorbidities Affecting Occupational Performance: May have comorbidities impacting occupational performance    Modification or Assistance to Complete Evaluation  Min-Moderate modification of tasks or assist with assess necessary to complete eval    OT Frequency 2x / week    OT  Duration 8 weeks   plus eval   OT Treatment/Interventions Self-care/ADL training;DME and/or AE instruction;Therapeutic activities;Therapeutic exercise;Cognitive remediation/compensation;Coping strategies training;Functional Mobility Training;Neuromuscular education;Visual/perceptual remediation/compensation;Patient/family education    Plan typing test for accuracy only for data entry, try reading and filling out schedule and corresponding questions, constant therapy on ipad    Consulted and Agree with Plan of Care Patient;Family member/caregiver    Family Member Consulted wife           Patient will benefit from skilled therapeutic intervention in order to improve the following deficits and impairments:   Body Structure / Function / Physical Skills: ADL, Strength, Balance, UE functional use, IADL, Vision, Coordination, Mobility, Sensation, FMC Cognitive Skills: Memory, Perception, Problem Solve, Sequencing     Visit Diagnosis: Other symptoms and signs involving cognitive functions following cerebral infarction  Visuospatial deficit  Other lack of coordination    Problem List Patient Active Problem List   Diagnosis Date Noted  . Aortic calcification (HCC) 02/22/2020  . Carotid artery calcification, bilateral 02/22/2020  . Depression   . GERD (gastroesophageal reflux disease)   . Ischemic stroke (HCC), occipital   . Diabetic polyneuropathy associated with type 2 diabetes mellitus (HCC) 06/30/2018  . Other chronic pain 06/30/2018  . Erectile dysfunction due to type 2 diabetes mellitus (HCC) 06/30/2018  . Circulation disorder of lower extremity 05/31/2015  . IBS (irritable bowel syndrome) 12/24/2014  . 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 Diabetic leg ulcer (HCC) 07/18/2013  . Restless legs syndrome (RLS) 11/20/2012  . Generalized   anxiety disorder   . Type 2 diabetes mellitus with vascular disease (HCC)   . Hyperlipidemia LDL goal <70 01/16/2009   . TOBACCO ABUSE 07/27/2007  . Morbid obesity (HCC) 09/29/2006  . Essential hypertension 09/29/2006  . CALCULUS, KIDNEY 05/30/2002    ,  Johnson, OTR/L 03/25/2020, 12:47 PM  [facility] [address] [place], [place], [address] Phone: [phone]   Fax:  [phone]  Name: [name] [name] MRN: [id number] Date of Birth: 10/16/1960 

## 2020-03-26 ENCOUNTER — Ambulatory Visit: Payer: Managed Care, Other (non HMO) | Admitting: Speech Pathology

## 2020-03-26 ENCOUNTER — Ambulatory Visit: Payer: Managed Care, Other (non HMO) | Admitting: Occupational Therapy

## 2020-03-26 DIAGNOSIS — R4701 Aphasia: Secondary | ICD-10-CM | POA: Diagnosis not present

## 2020-03-27 ENCOUNTER — Ambulatory Visit: Payer: Managed Care, Other (non HMO)

## 2020-03-27 ENCOUNTER — Ambulatory Visit: Payer: Managed Care, Other (non HMO) | Admitting: Speech Pathology

## 2020-03-27 ENCOUNTER — Encounter: Payer: Self-pay | Admitting: Speech Pathology

## 2020-03-27 ENCOUNTER — Other Ambulatory Visit: Payer: Self-pay

## 2020-03-27 DIAGNOSIS — R4701 Aphasia: Secondary | ICD-10-CM

## 2020-03-27 NOTE — Therapy (Signed)
[facility] [address] [place], [place], [address] Phone: [phone]   Fax:  [phone]  Speech Language Pathology Treatment  Patient Details  Name: [name] [name] MRN: [id number] Date of Birth: 1961-03-21 Referring Provider (SLP): Osvaldo Shipper (referring); Copland, Karleen Hampshire (PCP/documentation)   Encounter Date: 03/27/2020   End of Session - 03/27/20 1504    Visit Number 6    Number of Visits 17    Date for SLP Re-Evaluation 05/02/20    SLP Start Time 1402    SLP Stop Time  1445    SLP Time Calculation (min) 43 min    Activity Tolerance Patient tolerated treatment well           Past Medical History:  Diagnosis Date  . Angio-edema   . Anxiety   . Depression   . Diabetes mellitus type II    type 2  . GERD (gastroesophageal reflux disease)   . Headache    migraine  . History of kidney stones   . HLD (hyperlipidemia)   . HTN (hypertension)   . Morbid obesity (HCC)   . Restless legs syndrome (RLS) 11/20/2012  . Tobacco abuse   . Ulcer of left ankle (HCC)    last 2 months dime size dry dressing changing q day  . Urticaria     Past Surgical History:  Procedure Laterality Date  . Arm surgery Right 1969   Abscess excision and debridement at elbow  . COLONOSCOPY WITH PROPOFOL N/A 09/06/2016   Procedure: COLONOSCOPY WITH PROPOFOL;  Surgeon: Sherrilyn Rist, MD;  Location: [facility];  Service: Gastroenterology;  Laterality: N/A;  . I & D EXTREMITY Left 05/29/2014   Procedure: IRRIGATION AND DEBRIDEMENT OF LEFT LEG WOUND AND PLACEMENT OF  INTEGRA  AND  VAC;  Surgeon: Wayland Denis, DO;  Location: [facility];  Service: Plastics;  Laterality: Left;  . implantable loop recorder implant  03/06/2020   Medtronic Reveal [place] model LNQ11 (SN  [id number]) implanted by Dr Johney Frame for cryptogenic stroke  . INCISION AND DRAINAGE OF WOUND Left 04/23/2014   Procedure: IRRIGATION AND DEBRIDEMENT LOWER  LEFT LEG WOUND WITH PLACEMENT OF INTEGRA AND VAC;  Surgeon: Wayland Denis, DO;  Location: [facility];  Service: Plastics;  Laterality: Left;  . kidney stone removal  2004  . MINOR APPLICATION OF WOUND VAC Left 04/23/2014   Procedure: MINOR APPLICATION OF WOUND VAC;  Surgeon: Wayland Denis, DO;  Location: [facility];  Service: Plastics;  Laterality: Left;  . SKIN SPLIT GRAFT Left 06/18/2014   Procedure: SKIN GRAFT SPLIT THICKNESS TO LOWER LEFT LEG WOUND WITH PLACEMENT OF VAC;  Surgeon: Wayland Denis, DO;  Location: [facility];  Service: Plastics;  Laterality: Left;  Marland Kitchen VASECTOMY  1994    There were no vitals filed for this visit.   Subjective Assessment - 03/27/20 1414    Subjective "It's hard" re: reading    Currently in Pain? No/denies                 ADULT SLP TREATMENT - 03/27/20 1415      General Information   Behavior/Cognition Alert;Cooperative;Pleasant mood      Treatment Provided   Treatment provided Cognitive-Linquistic      Cognitive-Linquistic Treatment   Treatment focused on Aphasia;Patient/family/caregiver education    Skilled Treatment Tablet broke, [name] has ordered a new one to be delivered Sunday.  Added text to speech option on iphone. [name] accessed this  to read 2 texts and start News article on his phone. Cues and education to ID when to use finger tap vs play icon. [name] is to practice with this for [facility]. VNeST to target written expression and word finding. [name] ID's errors with mod I  and self corrects with occasional min A.  Today, [name] substituted synonyms twice during word finding episodes with mod I. Anxiety continues to be reduced from 1st few sessions. Pt continues to substitue p/b; q/g with good awraeness and self correction.       Assessment / Recommendations / Plan   Plan Continue with current plan of care      Progression Toward Goals   Progression toward goals Progressing toward goals            SLP  Education - 03/27/20 1502    Education Details Text to speech on iphone,VNEST    Person(s) Educated Patient;Spouse    Methods Explanation;Demonstration;Verbal cues    Comprehension Verbalized understanding;Returned demonstration;Verbal cues required;Need further instruction            SLP Short Term Goals - 03/27/20 1504      SLP SHORT TERM GOAL #1   Title Patient will participate functionally in 8 minutes simple-mod complex conversation using compensations for anomia as necessary, x3 visits.    Time 2    Period Weeks    Status On-going      SLP SHORT TERM GOAL #2   Title Pt will demonstrate auditory comprehension of 5 minutes simple-mod complex material by answering [facility] questions >90% accuracy x 2 sessions.    Time 2    Period Weeks    Status On-going      SLP SHORT TERM GOAL #3   Title Pt will participate in assessment of reading, writing and cognition with goals added PRN    Baseline writing assessed 03/12/20    Time 2    Period Weeks    Status On-going            SLP Long Term Goals - 03/27/20 1504      SLP LONG TERM GOAL #1   Title Patient will participate functionally in 15 minutes mod complex-complex conversation using compensations as necessary x 3 visits.    Time 6    Period Weeks    Status On-going      SLP LONG TERM GOAL #2   Title Patient will demonstrate auditory comprehension of 15 minutes mod complex content (conversation, podcast, radio program, etc) x 3 visits by answering detailed questions >90% accuracy.    Time 6    Period Weeks    Status On-going      SLP LONG TERM GOAL #3   Title Patient will report using accessibility features to read and reply to text messages x 3 visits.    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   Suspect mild cognitive deficits. Anxiety re: making errors is improving somewhat; will hold cognitive assessment for a later date for this reason. 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 I recommend skilled ST to maximize communication and cognitive abilities to reduce frustration, improve language abilities, safety, and for possible return to work.  Speech Therapy Frequency 2x / week    Duration 8 weeks   8 weeks or 17 visits   Treatment/Interventions Language facilitation;Environmental controls;Cueing hierarchy;SLP instruction and feedback;Cognitive reorganization;Compensatory techniques;Functional tasks;Compensatory strategies;Internal/external aids;Multimodal communcation approach;Patient/family education    Potential to Achieve Goals Good           Patient will benefit from skilled therapeutic intervention in order to improve the following deficits and impairments:   Aphasia    Problem List Patient Active Problem List   Diagnosis Date Noted  . Aortic calcification (HCC) 02/22/2020  . Carotid artery calcification, bilateral 02/22/2020  . Depression   . GERD (gastroesophageal reflux disease)   . Ischemic stroke (HCC), occipital   . Diabetic polyneuropathy associated with type 2 diabetes mellitus (HCC) 06/30/2018  . Other chronic pain 06/30/2018  . Erectile dysfunction due to type 2 diabetes mellitus (HCC) 06/30/2018  . Circulation disorder of lower extremity 05/31/2015  . IBS (irritable bowel syndrome) 12/24/2014  . 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 Diabetic leg ulcer (HCC) 07/18/2013  . Restless legs syndrome (RLS) 11/20/2012  . Generalized anxiety disorder   . Type 2 diabetes mellitus with vascular disease (HCC)   . Hyperlipidemia LDL goal <70 01/16/2009  . TOBACCO ABUSE 07/27/2007  . Morbid obesity (HCC) 09/29/2006  . Essential hypertension 09/29/2006  . CALCULUS, KIDNEY 05/30/2002    Tanylah Schnoebelen, Radene Journey MS, CCC-SLP 03/27/2020, 3:05 PM  [facility] [address] [place], [place], [address] Phone: [phone]   Fax:  [phone]   Name: Offie Waide MRN: [id number] Date of Birth: [birth year]

## 2020-03-27 NOTE — Therapy (Signed)
[facility] [address] [place], [place], [address] Phone: [phone]   Fax:  [phone]  Speech Language Pathology Treatment  Patient Details  Name: [name] [name] MRN: [id number] Date of Birth: Mar 23, 1961 Referring Provider (SLP): Osvaldo Shipper (referring); Copland, Spencer (PCP/documentation)   Encounter Date: 03/26/2020   End of Session - 03/26/20 1055    Visit Number 5    Number of Visits 17    Date for SLP Re-Evaluation 05/02/20    SLP Start Time 1317    SLP Stop Time  1400    SLP Time Calculation (min) 43 min    Activity Tolerance Patient tolerated treatment well           Past Medical History:  Diagnosis Date  . Angio-edema   . Anxiety   . Depression   . Diabetes mellitus type II    type 2  . GERD (gastroesophageal reflux disease)   . Headache    migraine  . History of kidney stones   . HLD (hyperlipidemia)   . HTN (hypertension)   . Morbid obesity (HCC)   . Restless legs syndrome (RLS) 11/20/2012  . Tobacco abuse   . Ulcer of left ankle (HCC)    last 2 months dime size dry dressing changing q day  . Urticaria     Past Surgical History:  Procedure Laterality Date  . Arm surgery Right 1969   Abscess excision and debridement at elbow  . COLONOSCOPY WITH PROPOFOL N/A 09/06/2016   Procedure: COLONOSCOPY WITH PROPOFOL;  Surgeon: Sherrilyn Rist, MD;  Location: [facility];  Service: Gastroenterology;  Laterality: N/A;  . I & D EXTREMITY Left 05/29/2014   Procedure: IRRIGATION AND DEBRIDEMENT OF LEFT LEG WOUND AND PLACEMENT OF  INTEGRA  AND  VAC;  Surgeon: Wayland Denis, DO;  Location: [facility];  Service: Plastics;  Laterality: Left;  . implantable loop recorder implant  03/06/2020   Medtronic Reveal [place] model LNQ11 (SN  [id number]) implanted by Dr Johney Frame for cryptogenic stroke  . INCISION AND DRAINAGE OF WOUND Left 04/23/2014   Procedure: IRRIGATION AND DEBRIDEMENT LOWER  LEFT LEG WOUND WITH PLACEMENT OF INTEGRA AND VAC;  Surgeon: Wayland Denis, DO;  Location: [facility];  Service: Plastics;  Laterality: Left;  . kidney stone removal  2004  . MINOR APPLICATION OF WOUND VAC Left 04/23/2014   Procedure: MINOR APPLICATION OF WOUND VAC;  Surgeon: Wayland Denis, DO;  Location: [facility];  Service: Plastics;  Laterality: Left;  . SKIN SPLIT GRAFT Left 06/18/2014   Procedure: SKIN GRAFT SPLIT THICKNESS TO LOWER LEFT LEG WOUND WITH PLACEMENT OF VAC;  Surgeon: Wayland Denis, DO;  Location:  SUR[facility]ERY CENTER;  Service: Plastics;  Laterality: Left;  Marland Kitchen VASECTOMY  1994    There were no vitals filed for this visit.   Subjective Assessment - 03/26/20 1317   Subjective "He went to the opthalmologist."    Currently in Pain? No/denies                 ADULT SLP TREATMENT - 03/26/20 1317     General Information   Behavior/Cognition Alert;Cooperative;Pleasant mood      Treatment Provided   Treatment provided Cognitive-Linquistic      Cognitive-Linquistic Treatment   Treatment focused on Aphasia;Patient/family/caregiver education    Skilled Treatment Patient completed homework on tablet but it was lost due to malfunction; tablet was just replaced. He has continued with TalkPath exercises and reports  he is feeling more confident. Also completed VNEST task at home. Continues to display anxiety/fear of errors although much improved today; does not limit pt in therapy activities today. Significant right visual field cut was reported per opthalmology. Patient requested "longer words" for word to photo ID task; increased task complexity to level 4 and accuracy was 90%, with pt visually scanning appropriately. In word level and short sentence writing tasks, aphasic errors persist; pt corrects with min-mod A. At pt/spouse request added activities for recall, attention, and simple problem solving to pt home exercises. In simple conversation,  pt used gestures on 2 occasions with communication breakdowns, although spouse was quick to jump in to assist. SLP educated spouse re: allowing pt more time to self-cue, and demonstrated cuing for description if pt is unable to do so.      Assessment / Recommendations / Plan   Plan Continue with current plan of care      Progression Toward Goals   Progression toward goals Progressing toward goals              SLP Short Term Goals - 03/26/20 1317     SLP SHORT TERM GOAL #1   Title Patient will participate functionally in 8 minutes simple-mod complex conversation using compensations for anomia as necessary, x3 visits.    Time 2    Period Weeks    Status On-going      SLP SHORT TERM GOAL #2   Title Pt will demonstrate auditory comprehension of 5 minutes simple-mod complex material by answering [facility] questions >90% accuracy x 2 sessions.    Time 2    Period Weeks    Status On-going      SLP SHORT TERM GOAL #3   Title Pt will participate in assessment of reading, writing and cognition with goals added PRN    Baseline writing assessed 03/12/20    Time 2    Period Weeks    Status On-going            SLP Long Term Goals - 03/26/20 1317     SLP LONG TERM GOAL #1   Title Patient will participate functionally in 15 minutes mod complex-complex conversation using compensations as necessary x 3 visits.    Time 6    Period Weeks    Status On-going      SLP LONG TERM GOAL #2   Title Patient will demonstrate auditory comprehension of 15 minutes mod complex content (conversation, podcast, radio program, etc) x 3 visits by answering detailed questions >90% accuracy.    Time 6    Period Weeks    Status On-going      SLP LONG TERM GOAL #3   Title Patient will report using accessibility features to read and reply to text messages x 3 visits.    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  Suspect mild cognitive deficits. Anxiety re: making errors is improving somewhat; will hold cognitive assessment for a later date for this reason. 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 I recommend skilled ST to maximize communication and cognitive abilities to reduce frustration, improve language abilities, safety, and for possible return to work.    Speech  Therapy Frequency 2x / week    Duration --   8 weeks or 17 visits   Treatment/Interventions Language facilitation;Environmental controls;Cueing hierarchy;SLP instruction and feedback;Cognitive reorganization;Compensatory techniques;Functional tasks;Compensatory strategies;Internal/external aids;Multimodal communcation approach;Patient/family education    Potential to Achieve Goals Good    SLP Home Exercise Plan provided aphasia handout and info re: Constant Therapy           Patient will benefit from skilled therapeutic intervention in order to improve the following deficits and impairments:   Aphasia    Problem List Patient Active Problem List   Diagnosis Date Noted  . Aortic calcification (HCC) 02/22/2020  . Carotid artery calcification, bilateral 02/22/2020  . Depression   . GERD (gastroesophageal reflux disease)   . Ischemic stroke (HCC), occipital   . Diabetic polyneuropathy associated with type 2 diabetes mellitus (HCC) 06/30/2018  . Other chronic pain 06/30/2018  . Erectile dysfunction due to type 2 diabetes mellitus (HCC) 06/30/2018  . Circulation disorder of lower extremity 05/31/2015  . IBS (irritable bowel syndrome) 12/24/2014  . 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 Diabetic leg ulcer (HCC) 07/18/2013  . Restless legs syndrome (RLS) 11/20/2012  . Generalized anxiety disorder   . Type 2 diabetes mellitus with vascular disease  (HCC)   . Hyperlipidemia LDL goal <70 01/16/2009  . TOBACCO ABUSE 07/27/2007  . Morbid obesity (HCC) 09/29/2006  . Essential hypertension 09/29/2006  . CALCULUS, KIDNEY 05/30/2002   Rondel Baton, MS, CCC-SLP Speech-Language Pathologist  Arlana Lindau 03/27/2020, 11:03 AM  [facility] [address] [place], [place], [address] Phone: [phone]   Fax:  [phone]   Name: [name] [name] MRN: [id number] Date of Birth: 1960/10/04

## 2020-03-31 ENCOUNTER — Ambulatory Visit: Payer: Managed Care, Other (non HMO) | Admitting: Speech Pathology

## 2020-03-31 ENCOUNTER — Ambulatory Visit: Payer: Managed Care, Other (non HMO) | Admitting: Physical Therapy

## 2020-03-31 ENCOUNTER — Ambulatory Visit: Payer: Managed Care, Other (non HMO) | Attending: Internal Medicine | Admitting: Physical Therapy

## 2020-03-31 ENCOUNTER — Ambulatory Visit: Payer: Managed Care, Other (non HMO) | Admitting: Occupational Therapy

## 2020-03-31 ENCOUNTER — Other Ambulatory Visit: Payer: Self-pay

## 2020-03-31 DIAGNOSIS — R4701 Aphasia: Secondary | ICD-10-CM

## 2020-03-31 DIAGNOSIS — R41841 Cognitive communication deficit: Secondary | ICD-10-CM | POA: Insufficient documentation

## 2020-03-31 DIAGNOSIS — R41842 Visuospatial deficit: Secondary | ICD-10-CM

## 2020-03-31 DIAGNOSIS — R278 Other lack of coordination: Secondary | ICD-10-CM | POA: Diagnosis present

## 2020-03-31 DIAGNOSIS — R2681 Unsteadiness on feet: Secondary | ICD-10-CM

## 2020-03-31 DIAGNOSIS — I69318 Other symptoms and signs involving cognitive functions following cerebral infarction: Secondary | ICD-10-CM | POA: Insufficient documentation

## 2020-03-31 DIAGNOSIS — M6281 Muscle weakness (generalized): Secondary | ICD-10-CM

## 2020-03-31 DIAGNOSIS — R2689 Other abnormalities of gait and mobility: Secondary | ICD-10-CM | POA: Diagnosis present

## 2020-03-31 DIAGNOSIS — H53461 Homonymous bilateral field defects, right side: Secondary | ICD-10-CM | POA: Insufficient documentation

## 2020-03-31 NOTE — Therapy (Signed)
[facility] [address] [place], [place], [address] Phone: [phone]   Fax:  [phone]  Occupational Therapy Treatment  Patient Details  Name: [name] [name] MRN: [id number] Date of Birth: 08/17/1960 Referring Provider (OT): Dr. Erlinda Hong   Encounter Date: 03/31/2020   OT End of Session - 03/31/20 1350    Visit Number 3    Number of Visits 16    Date for OT Re-Evaluation 05/17/20    Authorization Type Cigna/C[employer]a managed    Authorization Time Period 60 days    OT Start Time 1315    OT Stop Time 1400    OT Time Calculation (min) 45 min    Activity Tolerance Patient tolerated treatment well;Other (comment)   Pt became tearful with challenging cognitive activity   Behavior During Therapy WFL for tasks assessed/performed           Past Medical History:  Diagnosis Date  . Angio-edema   . Anxiety   . Depression   . Diabetes mellitus type II    type 2  . GERD (gastroesophageal reflux disease)   . Headache    migraine  . History of kidney stones   . HLD (hyperlipidemia)   . HTN (hypertension)   . Morbid obesity ([place])   . Restless legs syndrome (RLS) 11/20/2012  . Tobacco abuse   . Ulcer of left ankle (HCC)    last 2 months dime size dry dressing changing q day  . Urticaria     Past Surgical History:  Procedure Laterality Date  . Arm surgery Right 1969   Abscess excision and debridement at elbow  . COLONOSCOPY WITH PROPOFOL N/A 09/06/2016   Procedure: COLONOSCOPY WITH PROPOFOL;  Surgeon: Doran Stabler, MD;  Location: [facility];  Service: Gastroenterology;  Laterality: N/A;  . I & D EXTREMITY Left 05/29/2014   Procedure: IRRIGATION AND DEBRIDEMENT OF LEFT LEG WOUND AND PLACEMENT OF  INTEGRA  AND  VAC;  Surgeon: Theodoro Kos, DO;  Location: [facility];  Service: Plastics;  Laterality: Left;  . implantable loop recorder implant  03/06/2020   Medtronic Reveal [place] model LNQ11 (SN   [id number]) implanted by Dr Rayann Heman for cryptogenic stroke  . INCISION AND DRAINAGE OF WOUND Left 04/23/2014   Procedure: IRRIGATION AND DEBRIDEMENT LOWER LEFT LEG WOUND WITH PLACEMENT OF INTEGRA AND VAC;  Surgeon: Theodoro Kos, DO;  Location: [facility];  Service: Plastics;  Laterality: Left;  . kidney stone removal  2004  . MINOR APPLICATION OF WOUND VAC Left 04/23/2014   Procedure: MINOR APPLICATION OF WOUND VAC;  Surgeon: Theodoro Kos, DO;  Location: [facility];  Service: Plastics;  Laterality: Left;  . SKIN SPLIT GRAFT Left 06/18/2014   Procedure: SKIN GRAFT SPLIT THICKNESS TO LOWER LEFT LEG WOUND WITH PLACEMENT OF VAC;  Surgeon: Theodoro Kos, DO;  Location: [facility];  Service: Plastics;  Laterality: Left;  Marland Kitchen VASECTOMY  1994    There were no vitals filed for this visit.   Subjective Assessment - 03/31/20 1319    Subjective  I was having weird visual things happening yesterday where it looked like sparklers when looking at TV and ipad, etc.    Pertinent History CVA 02/14/20 Lt occipital lobe. PMH: 2 small previous strokes, DM, GERD, HLD, HTN (but now has issues w/ orthostasis)    Limitations no driving, loop recorder    Currently in Pain? No/denies    Pain Onset More than a month  ago          Typing test (words) 3 wpm, 72% accuracy. Typing test (numbers only) 3 wpm, 100% accuracy. Pt had to stop due to anxiety: pt became SOB and was perspiring. BP = 108/74, HR = 94 Pt appeared limited by aphasia and decreased processing speed.   Attempted filling in schedule with extra time required and 1 error, then answered corresponding questions (therapist read questions aloud to patient d/t time constraints and aphasia) - pt able to answer all correctly with extra time.                         OT Short Term Goals - 03/25/20 1241      OT SHORT TERM GOAL #1   Title Pt will be independent with HEP for Rt hand coordination and Rt  shoulder (low range strengthening)    Time 4    Period Weeks    Status On-going   MET for coordination     OT SHORT TERM GOAL #2   Title Pt will be independent with visual scanning strategies to compensate for Rt visual field cut    Time 4    Period Weeks    Status On-going      OT SHORT TERM GOAL #3   Title Improve coordination Rt hand to 28 sec or less for high level tasks    Baseline 33.91 sec    Time 4    Period Weeks    Status New      OT SHORT TERM GOAL #4   Title Pt will verbalize understanding with memory compensatory strategies and utilize for medication and financial management    Time 4    Period Weeks    Status New      OT SHORT TERM GOAL #5   Title Pt will perform simple financial management tasks using calculator prn with supervision and no more than min v.c's    Time 4    Period Weeks      OT SHORT TERM GOAL #6   Title Pt will perform simple stovetop task with supervision only    Time 4    Period Weeks    Status New      OT SHORT TERM GOAL #7   Title Pt will perform tabletop scanning with 100% accuracy and environmental scanning with 75% or greater accuracy    Time 4    Period Weeks    Status New             OT Long Term Goals - 03/17/20 1449      OT LONG TERM GOAL #1   Title Pt to return to cooking with distant supervision    Time 8    Period Weeks    Status New      OT LONG TERM GOAL #2   Title Pt will complete meal plan for the week and develop corresponding grocery list I'ly    Time 8    Period Weeks    Status New      OT LONG TERM GOAL #3   Title Pt will perform full financial management tasks at 100% accuracy with no cues    Time 8    Period Weeks    Status New      OT LONG TERM GOAL #4   Title Pt will demo organizational and problem solving skills to complete and adapt a schedule prn    Time 8  Period Weeks    Status New      OT LONG TERM GOAL #5   Title Pt will perform environmental scanning with 90% or greater accuracy     Time 8    Period Weeks    Status New                 Plan - 03/31/20 1411    Clinical Impression Statement Pt became anxious today with typing activity and had to stop. BP WNL's. Pt limited due to aphasia and decreased processing.    OT Occupational Profile and History Detailed Assessment- Review of Records and additional review of physical, cognitive, psychosocial history related to current functional performance    Occupational performance deficits (Please refer to evaluation for details): ADL's;IADL's;Work;Leisure    Body Structure / Function / Physical Skills ADL;Strength;Balance;UE functional use;IADL;Vision;Coordination;Mobility;Sensation;FMC    Cognitive Skills Memory;Perception;Problem Solve;Sequencing    Rehab Potential Good    Clinical Decision Making Several treatment options, min-mod task modification necessary    Comorbidities Affecting Occupational Performance: May have comorbidities impacting occupational performance    Modification or Assistance to Complete Evaluation  Min-Moderate modification of tasks or assist with assess necessary to complete eval    OT Frequency 2x / week    OT Duration 8 weeks   plus eval   OT Treatment/Interventions Self-care/ADL training;DME and/or AE instruction;Therapeutic activities;Therapeutic exercise;Cognitive remediation/compensation;Coping strategies training;Functional Mobility Training;Neuromuscular education;Visual/perceptual remediation/compensation;Patient/family education    Plan try puzzle for visual scanning and developing strategies/simple problem solving (word free), PVC pipe design, sequencing cards, environmental scanning   Consulted and Agree with Plan of Care Patient;Family member/caregiver    Family Member Consulted wife           Patient will benefit from skilled therapeutic intervention in order to improve the following deficits and impairments:   Body Structure / Function / Physical Skills: ADL, Strength,  Balance, UE functional use, IADL, Vision, Coordination, Mobility, Sensation, [facility] Cognitive Skills: Memory, Perception, Problem Solve, Sequencing     Visit Diagnosis: Other symptoms and signs involving cognitive functions following cerebral infarction  Visuospatial deficit  Other lack of coordination    Problem List Patient Active Problem List   Diagnosis Date Noted  . Aortic calcification ([place]) 02/22/2020  . Carotid artery calcification, bilateral 02/22/2020  . Depression   . GERD (gastroesophageal reflux disease)   . Ischemic stroke ([place]), occipital   . Diabetic polyneuropathy associated with type 2 diabetes mellitus ([place]) 06/30/2018  . Other chronic pain 06/30/2018  . Erectile dysfunction due to type 2 diabetes mellitus ([place]) 06/30/2018  . Circulation disorder of lower extremity 05/31/2015  . IBS (irritable bowel syndrome) 12/24/2014  . 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 Diabetic leg ulcer ([place]) 07/18/2013  . Restless legs syndrome (RLS) 11/20/2012  . Generalized anxiety disorder   . Type 2 diabetes mellitus with vascular disease ([place])   . Hyperlipidemia LDL goal <70 01/16/2009  . TOBACCO ABUSE 07/27/2007  . Morbid obesity ([place]) 09/29/2006  . Essential hypertension 09/29/2006  . CALCULUS, KIDNEY 05/30/2002    Carey Bullocks, OTR/L 03/31/2020, 2:13 PM  [place] [address] [place], [place], [address] Phone: [phone]   Fax:  [phone]  Name: [name] [name] MRN: [id number] Date of Birth: 02/08/1961

## 2020-03-31 NOTE — Therapy (Signed)
[facility] [address] [place], [place], [address] Phone: [phone]   Fax:  [phone]  Speech Language Pathology Treatment  Patient Details  Name: [name] [name] MRN: [id number] Date of Birth: 1961-04-02 Referring Provider (SLP): Osvaldo Shipper (referring); Copland, Spencer (PCP/documentation)   Encounter Date: 03/31/2020   End of Session - 03/31/20 1332    Visit Number 7    Number of Visits 17    Date for SLP Re-Evaluation 05/02/20    SLP Start Time 1232    SLP Stop Time  1315    SLP Time Calculation (min) 43 min    Activity Tolerance Patient tolerated treatment well           Past Medical History:  Diagnosis Date   Angio-edema    Anxiety    Depression    Diabetes mellitus type II    type 2   GERD (gastroesophageal reflux disease)    Headache    migraine   History of kidney stones    HLD (hyperlipidemia)    HTN (hypertension)    Morbid obesity (HCC)    Restless legs syndrome (RLS) 11/20/2012   Tobacco abuse    Ulcer of left ankle (HCC)    last 2 months dime size dry dressing changing q day   Urticaria     Past Surgical History:  Procedure Laterality Date   Arm surgery Right 1969   Abscess excision and debridement at elbow   COLONOSCOPY WITH PROPOFOL N/A 09/06/2016   Procedure: COLONOSCOPY WITH PROPOFOL;  Surgeon: Sherrilyn Rist, MD;  Location: [facility];  Service: Gastroenterology;  Laterality: N/A;   I & D EXTREMITY Left 05/29/2014   Procedure: IRRIGATION AND DEBRIDEMENT OF LEFT LEG WOUND AND PLACEMENT OF  INTEGRA  AND  VAC;  Surgeon: Wayland Denis, DO;  Location: [facility];  Service: Plastics;  Laterality: Left;   implantable loop recorder implant  03/06/2020   Medtronic Reveal [place] model LNQ11 (SN  [id number]) implanted by Dr Johney Frame for cryptogenic stroke   INCISION AND DRAINAGE OF WOUND Left 04/23/2014   Procedure: IRRIGATION AND DEBRIDEMENT LOWER  LEFT LEG WOUND WITH PLACEMENT OF INTEGRA AND VAC;  Surgeon: Wayland Denis, DO;  Location: [facility];  Service: Plastics;  Laterality: Left;   kidney stone removal  2004   MINOR APPLICATION OF WOUND VAC Left 04/23/2014   Procedure: MINOR APPLICATION OF WOUND VAC;  Surgeon: Wayland Denis, DO;  Location: [facility];  Service: Plastics;  Laterality: Left;   SKIN SPLIT GRAFT Left 06/18/2014   Procedure: SKIN GRAFT SPLIT THICKNESS TO LOWER LEFT LEG WOUND WITH PLACEMENT OF VAC;  Surgeon: Wayland Denis, DO;  Location: [facility];  Service: Plastics;  Laterality: Left;   VASECTOMY  1994    There were no vitals filed for this visit.   Subjective Assessment - 03/31/20 1235    Subjective Arrives with new iPad.    Currently in Pain? Yes    Pain Score 3     Pain Location Leg                 ADULT SLP TREATMENT - 03/31/20 1236      General Information   Behavior/Cognition Alert;Cooperative;Pleasant mood      Treatment Provided   Treatment provided Cognitive-Linquistic      Cognitive-Linquistic Treatment   Treatment focused on Aphasia;Patient/family/caregiver education    Skilled Treatment Simple to mod complex conversation (8 minutes) today re: patient's  birthday gathering; pt used compensations for anomia x3, including slower rate, description, use of synonym with mod I. Assisted pt with downloading [employer] to target auditory comprehension at multiparagraph level and demo'd how pt can use this at home. Pt listened to selections 1-2 minutes long and answered comprehension questions 91% accuracy. Reviewed pt's written homework (sentences); pt made several self corrections at home prior to turning in assignment.       Assessment / Recommendations / Plan   Plan Continue with current plan of care      Progression Toward Goals   Progression toward goals Progressing toward goals              SLP Short Term Goals - 03/31/20 1324       SLP SHORT TERM GOAL #1   Title Patient will participate functionally in 8 minutes simple-mod complex conversation using compensations for anomia as necessary, x3 visits.    Baseline 03/31/20    Time 1    Period Weeks    Status On-going      SLP SHORT TERM GOAL #2   Title Pt will demonstrate auditory comprehension of 5 minutes simple-mod complex material by answering WH questions >90% accuracy x 2 sessions.    Time 1    Period Weeks    Status On-going      SLP SHORT TERM GOAL #3   Title Pt will participate in assessment of reading, writing and cognition with goals added PRN    Baseline writing assessed 03/12/20    Time 1    Period Weeks    Status On-going            SLP Long Term Goals - 03/31/20 1330      SLP LONG TERM GOAL #1   Title Patient will participate functionally in 15 minutes mod complex-complex conversation using compensations as necessary x 3 visits.    Time 5    Period Weeks    Status On-going      SLP LONG TERM GOAL #2   Title Patient will demonstrate auditory comprehension of 15 minutes mod complex content (conversation, podcast, radio program, etc) x 3 visits by answering detailed questions >90% accuracy.    Time 5    Period Weeks    Status On-going      SLP LONG TERM GOAL #3   Title Patient will report using accessibility features to read and reply to text messages x 3 visits.    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  Suspect mild cognitive deficits. Anxiety continues to improve and is less of a factor in sessions. 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 I recommend skilled ST to maximize communication and cognitive abilities to reduce frustration, improve language abilities, safety, and for possible return to work.    Speech Therapy  Frequency 2x / week    Duration 8 weeks   8 weeks or 17 visits   Treatment/Interventions Language facilitation;Environmental controls;Cueing hierarchy;SLP instruction and feedback;Cognitive reorganization;Compensatory techniques;Functional tasks;Compensatory strategies;Internal/external aids;Multimodal communcation approach;Patient/family education    Potential to Achieve Goals Good           Patient will benefit from skilled therapeutic intervention in order to improve the following deficits and  impairments:   Aphasia    Problem List Patient Active Problem List   Diagnosis Date Noted   Aortic calcification (HCC) 02/22/2020   Carotid artery calcification, bilateral 02/22/2020   Depression    GERD (gastroesophageal reflux disease)    Ischemic stroke (HCC), occipital    Diabetic polyneuropathy associated with type 2 diabetes mellitus (HCC) 06/30/2018   Other chronic pain 06/30/2018   Erectile dysfunction due to type 2 diabetes mellitus (HCC) 06/30/2018   Circulation disorder of lower extremity 05/31/2015   IBS (irritable bowel syndrome) 12/24/2014   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 [place], [place], [address] Phone: [phone]   Fax:  [phone]   Name: Yitzchak Kothari MRN: [id number] Date of Birth: January 06, 1961

## 2020-03-31 NOTE — Therapy (Signed)
[facility] [address] [place], [place], [address] Phone: [phone]   Fax:  [phone]  Physical Therapy Treatment  Patient Details  Name: [name] [name] MRN: [id number] Date of Birth: May 20, 1961 Referring Provider (PT): Dr. Dallas Schimke   Encounter Date: 03/31/2020   PT End of Session - 03/31/20 1152    Visit Number 5    Number of Visits 9    Date for PT Re-Evaluation 04/21/20    Authorization Type Medicare    Progress Note Due on Visit 9    PT Start Time 1152    PT Stop Time 1230    PT Time Calculation (min) 38 min    Equipment Utilized During Treatment Gait belt    Activity Tolerance Patient tolerated treatment well    Behavior During Therapy WFL for tasks assessed/performed           Past Medical History:  Diagnosis Date  . Angio-edema   . Anxiety   . Depression   . Diabetes mellitus type II    type 2  . GERD (gastroesophageal reflux disease)   . Headache    migraine  . History of kidney stones   . HLD (hyperlipidemia)   . HTN (hypertension)   . Morbid obesity (HCC)   . Restless legs syndrome (RLS) 11/20/2012  . Tobacco abuse   . Ulcer of left ankle (HCC)    last 2 months dime size dry dressing changing q day  . Urticaria     Past Surgical History:  Procedure Laterality Date  . Arm surgery Right 1969   Abscess excision and debridement at elbow  . COLONOSCOPY WITH PROPOFOL N/A 09/06/2016   Procedure: COLONOSCOPY WITH PROPOFOL;  Surgeon: Sherrilyn Rist, MD;  Location: [facility];  Service: Gastroenterology;  Laterality: N/A;  . I & D EXTREMITY Left 05/29/2014   Procedure: IRRIGATION AND DEBRIDEMENT OF LEFT LEG WOUND AND PLACEMENT OF  INTEGRA  AND  VAC;  Surgeon: Wayland Denis, DO;  Location: [facility];  Service: Plastics;  Laterality: Left;  . implantable loop recorder implant  03/06/2020   Medtronic Reveal [place] model LNQ11 (SN  [id number]) implanted by Dr Johney Frame for  cryptogenic stroke  . INCISION AND DRAINAGE OF WOUND Left 04/23/2014   Procedure: IRRIGATION AND DEBRIDEMENT LOWER LEFT LEG WOUND WITH PLACEMENT OF INTEGRA AND VAC;  Surgeon: Wayland Denis, DO;  Location: [facility];  Service: Plastics;  Laterality: Left;  . kidney stone removal  2004  . MINOR APPLICATION OF WOUND VAC Left 04/23/2014   Procedure: MINOR APPLICATION OF WOUND VAC;  Surgeon: Wayland Denis, DO;  Location: [facility];  Service: Plastics;  Laterality: Left;  . SKIN SPLIT GRAFT Left 06/18/2014   Procedure: SKIN GRAFT SPLIT THICKNESS TO LOWER LEFT LEG WOUND WITH PLACEMENT OF VAC;  Surgeon: Wayland Denis, DO;  Location: [facility];  Service: Plastics;  Laterality: Left;  Marland Kitchen VASECTOMY  1994    There were no vitals filed for this visit.                      [facility] Adult PT Treatment/Exercise - 03/31/20 0001      Ambulation/Gait   Ambulation/Gait Yes    Ambulation/Gait Assistance 5: Supervision    Ambulation/Gait Assistance Details 10 min 13 sec around building outdoors and on grass; SHOB 4/10, HR 89;  2 standing rest breaks due to hip pain    Ambulation Distance (Feet) 805  Feet    Assistive device Straight cane    Gait Pattern Step-through pattern    Ambulation Surface Unlevel;Outdoor;Paved;Grass   grass for 25'     Knee/Hip Exercises: Aerobic   Other Aerobic --      Knee/Hip Exercises: Standing   Hip Abduction Stengthening;Both;1 set;10 reps;Knee straight;Limitations    Abduction Limitations completed with countertop support + red tband    Hip Extension Stengthening;Both;1 set;10 reps;Knee straight;Limitations    Extension Limitations completed with countertop support + red tband               Balance Exercises - 03/31/20 0001      Balance Exercises: Standing   Standing Eyes Opened Narrow base of support (BOS);Head turns;Foam/compliant surface;Limitations;30 secs    Standing Eyes Opened Limitations --     Standing Eyes Closed Narrow base of support (BOS);Foam/compliant surface;30 secs;Limitations;2 reps               PT Short Term Goals - 03/25/20 1131      PT SHORT TERM GOAL #1   Title patient will be able to walk with or without AD for 10 min to improve walking endurance    Baseline <5 min without AD, 12 minutes with SPC    Time 4    Period Weeks    Status Achieved    Target Date 03/24/20             PT Long Term Goals - 02/25/20 1933      PT LONG TERM GOAL #1   Title Pt will be able to ambulate for 30 min with or without AD to improve walking endurance    Baseline <5 min without AD    Time 8    Period Weeks    Status New    Target Date 04/21/20      PT LONG TERM GOAL #2   Title Pt will demo 12 sec or less with 5x/ sit to stand without HHA to improve functional strength    Baseline 15 sec without HHA    Time 8    Period Weeks    Status New    Target Date 04/21/20                 Plan - 03/31/20 1353    Clinical Impression Statement Treatment session focused on improving pt's endurance walking outside on paved and grassy surfaces. Progressed pt's hip strengthening exercises to include the red tband -- discussed with pt and wife at length to rest tomorrow if pt is too sore and to drop to 1 set of 10 reps if needed. Continued to focus on pt's balance on compliant surface.    Personal Factors and Comorbidities Comorbidity 3+;Time since onset of injury/illness/exacerbation    Comorbidities hypertension, hyperlipidemia, type 2 diabetes mellitus, restless leg syndrome, diabetic neuropathy, morbid obesity, tobacco abuse, depression/anxiety, GERD, chronic venous stasis ulcer in bilateral lower extremities    Examination-Activity Limitations Squat;Stand;Stairs    Examination-Participation Restrictions Community Activity;Yard Work;Shop    Rehab Potential Good    PT Frequency 1x / week    PT Duration 8 weeks    PT Treatment/Interventions ADLs/Self Care Home  Management;Gait training;Stair training;Functional mobility training;Therapeutic activities;Therapeutic exercise;Balance training;Neuromuscular re-education;Patient/family education;Manual techniques;Passive range of motion;Energy conservation;Joint Manipulations    PT Next Visit Plan SciFit/NuStep or walking for Endurance. continue exercises for BLE strengthening. continue exercises to promote endurance. Balance Exercises (compliant surfaces). Consider ambulating on compliant surfaces.    PT Home Exercise Plan Walking program:  walking with family member for 5 min and gradually progressing it to 30 min    Consulted and Agree with Plan of Care Patient;Family member/caregiver    Family Member Consulted Wife [name])           Patient will benefit from skilled therapeutic intervention in order to improve the following deficits and impairments:  Abnormal gait, Decreased activity tolerance, Decreased mobility, Decreased endurance, Difficulty walking, Impaired sensation, Impaired vision/preception, Postural dysfunction  Visit Diagnosis: Unsteadiness on feet  Muscle weakness (generalized)  Other abnormalities of gait and mobility     Problem List Patient Active Problem List   Diagnosis Date Noted  . Aortic calcification (HCC) 02/22/2020  . Carotid artery calcification, bilateral 02/22/2020  . Depression   . GERD (gastroesophageal reflux disease)   . Ischemic stroke (HCC), occipital   . Diabetic polyneuropathy associated with type 2 diabetes mellitus (HCC) 06/30/2018  . Other chronic pain 06/30/2018  . Erectile dysfunction due to type 2 diabetes mellitus (HCC) 06/30/2018  . Circulation disorder of lower extremity 05/31/2015  . IBS (irritable bowel syndrome) 12/24/2014  . 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 Diabetic leg ulcer (HCC) 07/18/2013  . Restless legs syndrome (RLS) 11/20/2012  . Generalized anxiety disorder   . Type 2 diabetes mellitus with  vascular disease (HCC)   . Hyperlipidemia LDL goal <70 01/16/2009  . TOBACCO ABUSE 07/27/2007  . Morbid obesity (HCC) 09/29/2006  . Essential hypertension 09/29/2006  . CALCULUS, KIDNEY 05/30/2002    [facility] [address] PT, DPT 03/31/2020, 1:57 PM  [facility] [address] [place], [place], [address] Phone: [phone]   Fax:  [phone]  Name: Karmelo Bass MRN: [id number] Date of Birth: [birth year]

## 2020-04-02 ENCOUNTER — Ambulatory Visit: Payer: Managed Care, Other (non HMO) | Admitting: Occupational Therapy

## 2020-04-02 ENCOUNTER — Ambulatory Visit: Payer: Managed Care, Other (non HMO) | Admitting: Speech Pathology

## 2020-04-02 ENCOUNTER — Other Ambulatory Visit: Payer: Self-pay

## 2020-04-02 ENCOUNTER — Encounter: Payer: Self-pay | Admitting: Neurology

## 2020-04-02 ENCOUNTER — Ambulatory Visit (INDEPENDENT_AMBULATORY_CARE_PROVIDER_SITE_OTHER): Payer: Managed Care, Other (non HMO) | Admitting: Neurology

## 2020-04-02 ENCOUNTER — Ambulatory Visit: Payer: Managed Care, Other (non HMO)

## 2020-04-02 VITALS — BP 141/82 | HR 92 | Ht 68.0 in | Wt 313.6 lb

## 2020-04-02 DIAGNOSIS — H53461 Homonymous bilateral field defects, right side: Secondary | ICD-10-CM

## 2020-04-02 DIAGNOSIS — R4701 Aphasia: Secondary | ICD-10-CM

## 2020-04-02 DIAGNOSIS — I639 Cerebral infarction, unspecified: Secondary | ICD-10-CM

## 2020-04-02 DIAGNOSIS — R278 Other lack of coordination: Secondary | ICD-10-CM

## 2020-04-02 DIAGNOSIS — R2681 Unsteadiness on feet: Secondary | ICD-10-CM | POA: Diagnosis not present

## 2020-04-02 DIAGNOSIS — R41842 Visuospatial deficit: Secondary | ICD-10-CM

## 2020-04-02 NOTE — Therapy (Signed)
[facility] [address] [place], [place], [address] Phone: [phone]   Fax:  [phone]  Occupational Therapy Treatment  Patient Details  Name: [name] [name] MRN: [id number] Date of Birth: 22-Apr-1961 Referring Provider (OT): Dr. Erlinda Hong   Encounter Date: 04/02/2020   OT End of Session - 04/02/20 1257    Visit Number 4    Number of Visits 16    Date for OT Re-Evaluation 05/17/20    Authorization Type Cigna/Cigna managed    Authorization Time Period 60 days    OT Start Time 1148    OT Stop Time 1233    OT Time Calculation (min) 45 min    Activity Tolerance Patient tolerated treatment well;Other (comment)   Pt became tearful with challenging cognitive activity   Behavior During Therapy WFL for tasks assessed/performed           Past Medical History:  Diagnosis Date  . Angio-edema   . Anxiety   . Depression   . Diabetes mellitus type II    type 2  . GERD (gastroesophageal reflux disease)   . Headache    migraine  . History of kidney stones   . HLD (hyperlipidemia)   . HTN (hypertension)   . Morbid obesity ([facility])   . Restless legs syndrome (RLS) 11/20/2012  . Stroke ([facility]) 02/14/2020   ischemic/right sided affected  . Tobacco abuse   . Ulcer of left ankle ([facility])    last 2 months dime size dry dressing changing q day  . Urticaria     Past Surgical History:  Procedure Laterality Date  . Arm surgery Right 1969   Abscess excision and debridement at elbow  . COLONOSCOPY WITH PROPOFOL N/A 09/06/2016   Procedure: COLONOSCOPY WITH PROPOFOL;  Surgeon: Doran Stabler, MD;  Location: [facility];  Service: Gastroenterology;  Laterality: N/A;  . I & D EXTREMITY Left 05/29/2014   Procedure: IRRIGATION AND DEBRIDEMENT OF LEFT LEG WOUND AND PLACEMENT OF  INTEGRA  AND  VAC;  Surgeon: Theodoro Kos, DO;  Location: [facility];  Service: Plastics;  Laterality: Left;  . implantable loop recorder implant   03/06/2020   Medtronic Reveal Kelley model LNQ11 (SN  [id number]) implanted by Dr Rayann Heman for cryptogenic stroke  . INCISION AND DRAINAGE OF WOUND Left 04/23/2014   Procedure: IRRIGATION AND DEBRIDEMENT LOWER LEFT LEG WOUND WITH PLACEMENT OF INTEGRA AND VAC;  Surgeon: Theodoro Kos, DO;  Location: [facility];  Service: Plastics;  Laterality: Left;  . kidney stone removal  2004  . MINOR APPLICATION OF WOUND VAC Left 04/23/2014   Procedure: MINOR APPLICATION OF WOUND VAC;  Surgeon: Theodoro Kos, DO;  Location: [facility];  Service: Plastics;  Laterality: Left;  . SKIN SPLIT GRAFT Left 06/18/2014   Procedure: SKIN GRAFT SPLIT THICKNESS TO LOWER LEFT LEG WOUND WITH PLACEMENT OF VAC;  Surgeon: Theodoro Kos, DO;  Location: [facility];  Service: Plastics;  Laterality: Left;  Marland Kitchen VASECTOMY  1994    There were no vitals filed for this visit.   Subjective Assessment - 04/02/20 1155    Subjective  I like puzzles   Pertinent History CVA 02/14/20 Lt occipital lobe. PMH: 2 small previous strokes, DM, GERD, HLD, HTN (but now has issues w/ orthostasis)    Limitations no driving, loop recorder    Currently in Pain? No/denies    Pain Onset More than a month ago  OT Treatments/Exercises (OP) - 04/02/20 0001      Visual/Perceptual Exercises   Copy this Image PVC    PVC Pt copying figure 1 with initial set up/organization of pieces, but then did I'ly. Pt then copied more complex figure 14 w/ 2 cues    Other Exercises Pt assembled 24 pc puzzle I'ly with slightly extra time.                     OT Short Term Goals - 03/25/20 1241      OT SHORT TERM GOAL #1   Title Pt will be independent with HEP for Rt hand coordination and Rt shoulder (low range strengthening)    Time 4    Period Weeks    Status On-going   MET for coordination     OT SHORT TERM GOAL #2   Title Pt will be independent with visual scanning  strategies to compensate for Rt visual field cut    Time 4    Period Weeks    Status On-going      OT SHORT TERM GOAL #3   Title Improve coordination Rt hand to 28 sec or less for high level tasks    Baseline 33.91 sec    Time 4    Period Weeks    Status New      OT SHORT TERM GOAL #4   Title Pt will verbalize understanding with memory compensatory strategies and utilize for medication and financial management    Time 4    Period Weeks    Status New      OT SHORT TERM GOAL #5   Title Pt will perform simple financial management tasks using calculator prn with supervision and no more than min v.c's    Time 4    Period Weeks      OT SHORT TERM GOAL #6   Title Pt will perform simple stovetop task with supervision only    Time 4    Period Weeks    Status New      OT SHORT TERM GOAL #7   Title Pt will perform tabletop scanning with 100% accuracy and environmental scanning with 75% or greater accuracy    Time 4    Period Weeks    Status New             OT Long Term Goals - 03/17/20 1449      OT LONG TERM GOAL #1   Title Pt to return to cooking with distant supervision    Time 8    Period Weeks    Status New      OT LONG TERM GOAL #2   Title Pt will complete meal plan for the week and develop corresponding grocery list I'ly    Time 8    Period Weeks    Status New      OT LONG TERM GOAL #3   Title Pt will perform full financial management tasks at 100% accuracy with no cues    Time 8    Period Weeks    Status New      OT LONG TERM GOAL #4   Title Pt will demo organizational and problem solving skills to complete and adapt a schedule prn    Time 8    Period Weeks    Status New      OT LONG TERM GOAL #5   Title Pt will perform environmental scanning with 90% or greater accuracy  Time 8    Period Weeks    Status New                 Plan - 04/02/20 1258    Clinical Impression Statement Pt did very well with visual/perceptual tasks (word free) and  less anxiety with success.    OT Occupational Profile and History Detailed Assessment- Review of Records and additional review of physical, cognitive, psychosocial history related to current functional performance    Occupational performance deficits (Please refer to evaluation for details): ADL's;IADL's;Work;Leisure    Body Structure / Function / Physical Skills ADL;Strength;Balance;UE functional use;IADL;Vision;Coordination;Mobility;Sensation;FMC    Cognitive Skills Memory;Perception;Problem Solve;Sequencing    Rehab Potential Good    Clinical Decision Making Several treatment options, min-mod task modification necessary    Comorbidities Affecting Occupational Performance: May have comorbidities impacting occupational performance    Modification or Assistance to Complete Evaluation  Min-Moderate modification of tasks or assist with assess necessary to complete eval    OT Frequency 2x / week    OT Duration 8 weeks   plus eval   OT Treatment/Interventions Self-care/ADL training;DME and/or AE instruction;Therapeutic activities;Therapeutic exercise;Cognitive remediation/compensation;Coping strategies training;Functional Mobility Training;Neuromuscular education;Visual/perceptual remediation/compensation;Patient/family education    Plan sequencing cards, environmental scanning and negotiating tight spots in gym, memory strategies (following session: financial management and money exchange)    Consulted and Agree with Plan of Care Patient;Family member/caregiver    Family Member Consulted wife           Patient will benefit from skilled therapeutic intervention in order to improve the following deficits and impairments:   Body Structure / Function / Physical Skills: ADL, Strength, Balance, UE functional use, IADL, Vision, Coordination, Mobility, Sensation, [facility] Cognitive Skills: Memory, Perception, Problem Solve, Sequencing     Visit Diagnosis: Visuospatial deficit  Other lack of  coordination    Problem List Patient Active Problem List   Diagnosis Date Noted  . Aortic calcification ([facility]) 02/22/2020  . Carotid artery calcification, bilateral 02/22/2020  . Depression   . GERD (gastroesophageal reflux disease)   . Ischemic stroke ([facility]), occipital   . Diabetic polyneuropathy associated with type 2 diabetes mellitus ([facility]) 06/30/2018  . Other chronic pain 06/30/2018  . Erectile dysfunction due to type 2 diabetes mellitus ([facility]) 06/30/2018  . Circulation disorder of lower extremity 05/31/2015  . IBS (irritable bowel syndrome) 12/24/2014  . 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 Diabetic leg ulcer ([facility]) 07/18/2013  . Restless legs syndrome (RLS) 11/20/2012  . Generalized anxiety disorder   . Type 2 diabetes mellitus with vascular disease ([facility])   . Hyperlipidemia LDL goal <70 01/16/2009  . TOBACCO ABUSE 07/27/2007  . Morbid obesity ([facility]) 09/29/2006  . Essential hypertension 09/29/2006  . CALCULUS, KIDNEY 05/30/2002    Carey Bullocks, OTR/L 04/02/2020, 1:04 PM  [facility] [address] [place], [place], [address] Phone: [phone]   Fax:  [phone]  Name: [name] [name] MRN: [id number] Date of Birth: 1961-05-06

## 2020-04-02 NOTE — Progress Notes (Signed)
[name] [name] [address] [place] [id number] [phone]       OFFICE CONSULT NOTE  [name] Date of Birth:  05-Sep-1960 Medical Record Number:  [id number]   Referring MD: Osvaldo ShipperGokul Krishnan Reason for Referral: Stroke  HPI: [name] is a 59 year old Caucasian male seen today for initial office consultation visit for stroke.  He is accompanied by his wife.  History is obtained from them and review of electronic medical records and I personally reviewed imaging films in [id number] [email or web address].malewith medical history significant ofhypertension, hyperlipidemia, type 2 diabetes mellitus, restless leg syndrome, diabetic neuropathy, morbid obesity, tobacco abuse, depression/anxiety, GERD, chronic venous stasis ulcer in bilateral lower extremities presentedto emergency department with headache and right thigh vision loss started 2 AM on the morning of admission on 02/14/2020. He initially went to his ophthalmologist and was referred to the emergency department. Concern was for acute stroke. Patient was hospitalized for further management.  CT perfusion scan suggested a left occipital lobe infarct.  MRI was attempted but patient refused due to significant claustrophobia.  Subsequent repeat CT scan confirmed left posterior division MCA infarct.  CT angiogram showed no significant large vessel stenosis or occlusion.  Lower extremity venous Dopplers were negative for DVT.  Transthoracic echo showed normal ejection fraction without cardiac source of embolism.  LDL cholesterol was quite low at 15 mg percent and hemoglobin A1c was elevated 8.4.  Patient was started on aspirin and Plavix for 3 weeks followed by aspirin alone.  Patient states is done well since discharge she is now beginning to see some movement in the right peripheral field of vision but is still not able to count fingers.  He still has trouble reading and comprehending.  His speech has improved though it is still  slightly hesitant.  Is tolerating aspirin well without bruising or bleeding.  Patient is unable to return to work in [employer] and is currently on short-term disability.  He has not been driving.  He denies any prior history of atrial fibrillation, syncope or palpitations.  He had outpatient loop recorder placed by Dr. Johney FrameAllred and so for paroxysmal A. fib has not yet been found.  He also complains of mild short-term memory and cognitive difficulties which are new since his stroke.  ROS:   14 system review of systems is positive for vision loss, memory loss, speech difficulties difficulty with reading and comprehension, anxiety all other systems negative PMH:  Past Medical History:  Diagnosis Date  . Angio-edema   . Anxiety   . Depression   . Diabetes mellitus type II    type 2  . GERD (gastroesophageal reflux disease)   . Headache    migraine  . History of kidney stones   . HLD (hyperlipidemia)   . HTN (hypertension)   . Morbid obesity ([facility])   . Restless legs syndrome (RLS) 11/20/2012  . Stroke ([facility]) 02/14/2020   ischemic/right sided affected  . Tobacco abuse   . Ulcer of left ankle ([facility])    last 2 months dime size dry dressing changing q day  . Urticaria     Social History:  Social History   Socioeconomic History  . Marital status: Married    Spouse name: [name] [name]  . Number of children: 3  . Years of education: Not on file  . Highest education level: Not on file  Occupational History  . 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  Tobacco Use  . Smoking status: Current Every Day Smoker  Packs/day: 0.25    Years: 40.00    Pack years: 10.00    Types: Cigarettes  . Smokeless tobacco: Never Used  . Tobacco comment: one cigarette daily  Substance and Sexual Activity  . Alcohol use: Yes    Alcohol/week: 0.0 standard drinks    Comment: rarely  . Drug use: No  . Sexual activity: Yes    Partners: Female  Other Topics Concern  . Not on file  Social History  Narrative   Lives with wife   Right Handed   Drink 6-7 cups caffeine daily   Social Determinants of Health   Financial Resource Strain:   . Difficulty of Paying Living Expenses: Not on file  Food Insecurity:   . Worried About Programme researcher, broadcasting/film/video in the Last Year: Not on file  . Ran Out of Food in the Last Year: Not on file  Transportation Needs:   . Lack of Transportation (Medical): Not on file  . Lack of Transportation (Non-Medical): Not on file  Physical Activity:   . Days of Exercise per Week: Not on file  . Minutes of Exercise per Session: Not on file  Stress:   . Feeling of Stress : Not on file  Social Connections:   . Frequency of Communication with Friends and Family: Not on file  . Frequency of Social Gatherings with Friends and Family: Not on file  . Attends Religious Services: Not on file  . Active Member of Clubs or Organizations: Not on file  . Attends Banker Meetings: Not on file  . Marital Status: Not on file  Intimate Partner Violence:   . Fear of Current or Ex-Partner: Not on file  . Emotionally Abused: Not on file  . Physically Abused: Not on file  . Sexually Abused: Not on file    Medications:   Current Outpatient Medications on File Prior to Visit  Medication Sig Dispense Refill  . acetaminophen (TYLENOL) 500 MG tablet Take 1,000 mg by mouth 2 (two) times daily as needed (for pain).    Marland Kitchen aspirin EC 81 MG EC tablet Take 1 tablet (81 mg total) by mouth daily. Swallow whole. 30 tablet 3  . bromocriptine (PARLODEL) 2.5 MG tablet Take 0.5 tablets (1.25 mg total) by mouth at bedtime. 45 tablet 3  . colesevelam (WELCHOL) 625 MG tablet Take 2 tablets (1,250 mg total) by mouth daily. 60 tablet 11  . Dulaglutide (TRULICITY) 4.5 MG/0.5ML SOPN Inject 4.5 mg into the skin once a week. 12 pen 3  . EPINEPHrine 0.3 mg/0.3 mL IJ SOAJ injection INJECT 0.3 ML INTO THE  MUSCLE ONCE FOR 1 DOSE. 2 each 0  . FARXIGA 10 MG TABS tablet TAKE 1 TABLET BY MOUTH DAILY  BEFORE BREAKFAST 30 tablet 11  . glipiZIDE (GLUCOTROL XL) 10 MG 24 hr tablet TAKE 2 TABLETS BY MOUTH  DAILY WITH BREAKFAST 180 tablet 1  . glucose blood (CONTOUR NEXT TEST) test strip Use to check blood sugar two times a day. 100 each 5  . Lancets Micro Thin 33G MISC Use to check blood sugar two times daily. 100 each 5  . lisinopril-hydrochlorothiazide (ZESTORETIC) 20-12.5 MG tablet Take 0.5 tablets by mouth daily. 45 tablet 3  . metFORMIN (GLUCOPHAGE-XR) 500 MG 24 hr tablet TAKE 4 TABLETS BY MOUTH  DAILY WITH BREAKFAST 360 tablet 3  . Multiple Vitamin (MULTIVITAMIN) tablet Take 1 tablet by mouth daily.      . pantoprazole (PROTONIX) 40 MG tablet TAKE 1 TABLET BY MOUTH  DAILY 90 tablet 3  . PARoxetine (PAXIL) 30 MG tablet TAKE 1 TABLET BY MOUTH IN  THE MORNING 90 tablet 3  . pentoxifylline (TRENTAL) 400 MG CR tablet TAKE 1 TABLET BY MOUTH 3  TIMES DAILY WITH MEALS 270 tablet 3  . pregabalin (LYRICA) 75 MG capsule Take 1 capsule (75 mg total) by mouth 2 (two) times daily. 60 capsule 1  . rOPINIRole (REQUIP) 0.5 MG tablet TAKE 1 TABLET BY MOUTH  TWICE DAILY 180 tablet 3  . rosuvastatin (CRESTOR) 20 MG tablet Take 1 tablet (20 mg total) by mouth at bedtime. 30 tablet 2  . sildenafil (VIAGRA) 100 MG tablet Take 1 tablet (100 mg total) by mouth daily as needed for erectile dysfunction. 10 tablet 5  . gabapentin (NEURONTIN) 300 MG capsule TAKE 3 CAPSULES BY MOUTH 3  TIMES DAILY 810 capsule 3  . meloxicam (MOBIC) 15 MG tablet TAKE 1 TABLET BY MOUTH IN  THE MORNING 90 tablet 3   No current facility-administered medications on file prior to visit.    Allergies:   Allergies  Allergen Reactions  . Cashew Nut Oil Anaphylaxis    Cashews, 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  Marland Kitchen Pistachio Nut (Diagnostic) Hives, Shortness Of Breath, Itching, Swelling and Rash  . Latex Dermatitis  . Tape Other (See Comments)    Tears and bruises the skin  . Betadine [Povidone Iodine] Rash and Other (See Comments)    Raised rash  04/23/14  . Lidocaine Other (See Comments)    Minimal effect with lidocaine, prefers different anesthetic  . Penicillins Rash and Other (See Comments)    Rash all over as a child    Physical Exam General: Obese middle-aged Caucasian male seated, in no evident distress Head: head normocephalic and atraumatic.   Neck: supple with no carotid or supraclavicular bruits Cardiovascular: regular rate and rhythm, no murmurs Musculoskeletal: no deformity Skin:  no rash/petichiae Vascular:  Normal pulses all extremities  Neurologic Exam Mental Status: Awake and fully alert. Oriented to place and time. Recent and remote memory intact. Attention span, concentration and fund of knowledge appropriate. Mood and affect appropriate.  Speech is slightly nonfluent and hesitant but no dysarthria.  Diminished recall 1/3.  Able to name only 6 animals which can walk on 4 legs. Cranial Nerves: Fundoscopic exam reveals sharp disc margins. Pupils equal, briskly reactive to light. Extraocular movements full without nystagmus. Visual fields show dense right homonymous hemianopsia to confrontation. Hearing intact. Facial sensation intact. Face, tongue, palate moves normally and symmetrically.  Motor: Normal bulk and tone. Normal strength in all tested extremity muscles. Sensory.: intact to touch , pinprick , position and vibratory sensation.  Coordination: Rapid alternating movements normal in all extremities. Finger-to-nose and heel-to-shin performed accurately bilaterally. Gait and Station: Arises from chair without difficulty. Stance is normal. Gait demonstrates normal stride length and balance . Able to heel, toe and tandem walk without difficulty.  Reflexes: 1+ and symmetric. Toes downgoing.   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  Vascular risk factors of diabetes, hypertension hyperlipidemia.  And  obesity.  He also has mild post stroke cognitive impairment.     PLAN: I had a long d/w patient and his wife about his recent cryptogenic stroke and right sided visual field loss, risk for recurrent stroke/TIAs, personally independently reviewed imaging studies and stroke evaluation results and answered questions.Continue aspirin 81 mg daily  for secondary stroke  prevention and maintain strict control of hypertension with blood pressure goal below 130/90, diabetes with hemoglobin A1c goal below 6.5% and lipids with LDL cholesterol goal below 70 mg/dL. I also advised the patient to eat a healthy diet with plenty of whole grains, cereals, fruits and vegetables, exercise regularly and maintain ideal body weight.  I advised him to continue outpatient speech therapy.  He was advised not to drive till his peripheral vision improves.  He may also consider possible participation in the [place] stroke prevention trial if interested and will be given information to review and decide.  Patient is clearly disabled and unable to return back to work at the present time due to his deficits.  Followup in the future with me in 2 months or call earlier if necessary.  Greater than 50% time during this 45-minute consultation visit was spent on counseling and coordination of care about his cryptogenic stroke discussion about stroke prevention treatment and answering questions. Delia Heady, MD Note: This document was prepared with digital dictation and possible smart phrase technology. Any transcriptional errors that result from this process are unintentional.

## 2020-04-02 NOTE — Patient Instructions (Signed)
I had a long d/w patient and his wife about his recent cryptogenic stroke and right sided visual field loss, risk for recurrent stroke/TIAs, personally independently reviewed imaging studies and stroke evaluation results and answered questions.Continue aspirin 81 mg daily  for secondary stroke prevention and maintain strict control of hypertension with blood pressure goal below 130/90, diabetes with hemoglobin A1c goal below 6.5% and lipids with LDL cholesterol goal below 70 mg/dL. I also advised the patient to eat a healthy diet with plenty of whole grains, cereals, fruits and vegetables, exercise regularly and maintain ideal body weight.  I advised him to continue outpatient speech therapy.  He was advised not to drive till his peripheral vision improves.  He may also consider possible participation in the [place] stroke prevention trial if interested and will be given information to review and decide.  Patient is clearly disabled and unable to return back to work at the present time due to his deficits.  Followup in the future with me in 2 months or call earlier if necessary.  Stroke Prevention Some medical conditions and behaviors are associated with a higher chance of having a stroke. You can help prevent a stroke by making nutrition, lifestyle, and other changes, including managing any medical conditions you may have. What nutrition changes can be made?   Eat healthy foods. You can do this by: ? Choosing foods high in fiber, such as fresh fruits and vegetables and whole grains. ? Eating at least 5 or more servings of fruits and vegetables a day. Try to fill half of your plate at each meal with fruits and vegetables. ? Choosing lean protein foods, such as lean cuts of meat, poultry without skin, fish, tofu, beans, and nuts. ? Eating low-fat dairy products. ? Avoiding foods that are high in salt (sodium). This can help lower blood pressure. ? Avoiding foods that have saturated fat, trans fat, and  cholesterol. This can help prevent high cholesterol. ? Avoiding processed and premade foods.  Follow your health care provider's specific guidelines for losing weight, controlling high blood pressure (hypertension), lowering high cholesterol, and managing diabetes. These may include: ? Reducing your daily calorie intake. ? Limiting your daily sodium intake to 1,500 milligrams (mg). ? Using only healthy fats for cooking, such as olive oil, canola oil, or sunflower oil. ? Counting your daily carbohydrate intake. What lifestyle changes can be made?  Maintain a healthy weight. Talk to your health care provider about your ideal weight.  Get at least 30 minutes of moderate physical activity at least 5 days a week. Moderate activity includes brisk walking, biking, and swimming.  Do not use any products that contain nicotine or tobacco, such as cigarettes and e-cigarettes. If you need help quitting, ask your health care provider. It may also be helpful to avoid exposure to secondhand smoke.  Limit alcohol intake to no more than 1 drink a day for nonpregnant women and 2 drinks a day for men. One drink equals 12 oz of beer, 5 oz of wine, or 1 oz of hard liquor.  Stop any illegal drug use.  Avoid taking birth control pills. Talk to your health care provider about the risks of taking birth control pills if: ? You are over 66 years old. ? You smoke. ? You get migraines. ? You have ever had a blood clot. What other changes can be made?  Manage your cholesterol levels. ? Eating a healthy diet is important for preventing high cholesterol. If cholesterol cannot be managed  through diet alone, you may also need to take medicines. ? Take any prescribed medicines to control your cholesterol as told by your health care provider.  Manage your diabetes. ? Eating a healthy diet and exercising regularly are important parts of managing your blood sugar. If your blood sugar cannot be managed through diet and  exercise, you may need to take medicines. ? Take any prescribed medicines to control your diabetes as told by your health care provider.  Control your hypertension. ? To reduce your risk of stroke, try to keep your blood pressure below 130/80. ? Eating a healthy diet and exercising regularly are an important part of controlling your blood pressure. If your blood pressure cannot be managed through diet and exercise, you may need to take medicines. ? Take any prescribed medicines to control hypertension as told by your health care provider. ? Ask your health care provider if you should monitor your blood pressure at home. ? Have your blood pressure checked every year, even if your blood pressure is normal. Blood pressure increases with age and some medical conditions.  Get evaluated for sleep disorders (sleep apnea). Talk to your health care provider about getting a sleep evaluation if you snore a lot or have excessive sleepiness.  Take over-the-counter and prescription medicines only as told by your health care provider. Aspirin or blood thinners (antiplatelets or anticoagulants) may be recommended to reduce your risk of forming blood clots that can lead to stroke.  Make sure that any other medical conditions you have, such as atrial fibrillation or atherosclerosis, are managed. What are the warning signs of a stroke? The warning signs of a stroke can be easily remembered as BEFAST.  B is for balance. Signs include: ? Dizziness. ? Loss of balance or coordination. ? Sudden trouble walking.  E is for eyes. Signs include: ? A sudden change in vision. ? Trouble seeing.  F is for face. Signs include: ? Sudden weakness or numbness of the face. ? The face or eyelid drooping to one side.  A is for arms. Signs include: ? Sudden weakness or numbness of the arm, usually on one side of the body.  S is for speech. Signs include: ? Trouble speaking (aphasia). ? Trouble understanding.  T is for  time. ? These symptoms may represent a serious problem that is an emergency. Do not wait to see if the symptoms will go away. Get medical help right away. Call your local emergency services (911 in the [place] Do not drive yourself to the hospital.  Other signs of stroke may include: ? A sudden, severe headache with no known cause. ? Nausea or vomiting. ? Seizure. Where to find more information For more information, visit:  American Stroke Association: www.strokeassociation.org  National Stroke Association: www.stroke.org Summary  You can prevent a stroke by eating healthy, exercising, not smoking, limiting alcohol intake, and managing any medical conditions you may have.  Do not use any products that contain nicotine or tobacco, such as cigarettes and e-cigarettes. If you need help quitting, ask your health care provider. It may also be helpful to avoid exposure to secondhand smoke.  Remember BEFAST for warning signs of stroke. Get help right away if you or a loved one has any of these signs. This information is not intended to replace advice given to you by your health care provider. Make sure you discuss any questions you have with your health care provider. Document Revised: 04/28/2017 Document Reviewed: 06/21/2016 Elsevier Patient Education  [phone]  [employer].

## 2020-04-02 NOTE — Therapy (Signed)
[facility] [address] [place], [place], [address] Phone: [phone]   Fax:  [phone]  Speech Language Pathology Treatment  Patient Details  Name: [name] [name] MRN: [id number] Date of Birth: 26-May-1961 Referring Provider (SLP): Bonnielee Haff (referring); Copland, Frederico Hamman (PCP/documentation)   Encounter Date: 04/02/2020   End of Session - 04/02/20 1309    Visit Number 8    Number of Visits 17    Date for SLP Re-Evaluation 05/02/20    SLP Start Time 1104    SLP Stop Time  1145    SLP Time Calculation (min) 41 min    Activity Tolerance Patient tolerated treatment well           Past Medical History:  Diagnosis Date  . Angio-edema   . Anxiety   . Depression   . Diabetes mellitus type II    type 2  . GERD (gastroesophageal reflux disease)   . Headache    migraine  . History of kidney stones   . HLD (hyperlipidemia)   . HTN (hypertension)   . Morbid obesity ([place])   . Restless legs syndrome (RLS) 11/20/2012  . Stroke ([place]) 02/14/2020   ischemic/right sided affected  . Tobacco abuse   . Ulcer of left ankle ([facility])    last 2 months dime size dry dressing changing q day  . Urticaria     Past Surgical History:  Procedure Laterality Date  . Arm surgery Right 1969   Abscess excision and debridement at elbow  . COLONOSCOPY WITH PROPOFOL N/A 09/06/2016   Procedure: COLONOSCOPY WITH PROPOFOL;  Surgeon: Doran Stabler, MD;  Location: [facility];  Service: Gastroenterology;  Laterality: N/A;  . I & D EXTREMITY Left 05/29/2014   Procedure: IRRIGATION AND DEBRIDEMENT OF LEFT LEG WOUND AND PLACEMENT OF  INTEGRA  AND  VAC;  Surgeon: Theodoro Kos, DO;  Location: [facility];  Service: Plastics;  Laterality: Left;  . implantable loop recorder implant  03/06/2020   Medtronic Reveal [place] model LNQ11 (SN  [id number]) implanted by Dr Rayann Heman for cryptogenic stroke  . INCISION AND DRAINAGE OF WOUND  Left 04/23/2014   Procedure: IRRIGATION AND DEBRIDEMENT LOWER LEFT LEG WOUND WITH PLACEMENT OF INTEGRA AND VAC;  Surgeon: Theodoro Kos, DO;  Location: [facility];  Service: Plastics;  Laterality: Left;  . kidney stone removal  2004  . MINOR APPLICATION OF WOUND VAC Left 04/23/2014   Procedure: MINOR APPLICATION OF WOUND VAC;  Surgeon: Theodoro Kos, DO;  Location: [facility];  Service: Plastics;  Laterality: Left;  . SKIN SPLIT GRAFT Left 06/18/2014   Procedure: SKIN GRAFT SPLIT THICKNESS TO LOWER LEFT LEG WOUND WITH PLACEMENT OF VAC;  Surgeon: Theodoro Kos, DO;  Location: [facility];  Service: Plastics;  Laterality: Left;  Marland Kitchen VASECTOMY  1994    There were no vitals filed for this visit.   Subjective Assessment - 04/02/20 1105    Subjective "I'm actually kind of elated."    Patient is accompained by: Family member   wife   Currently in Pain? No/denies                 ADULT SLP TREATMENT - 04/02/20 1106      General Information   Behavior/Cognition Alert;Cooperative;Pleasant mood      Treatment Provided   Treatment provided Cognitive-Linquistic      Cognitive-Linquistic Treatment   Treatment focused on Aphasia;Patient/family/caregiver education    Skilled Treatment In  simple to mod complex conversation today re: neurology visit, pt used aphasia compensations functionally without cues. Reviewed written sentences completed for homework; pt had made self corrections (p/b, b/d, c/w); appeared to have caught ~60% of errors at home. SLP had pt review and read his sentences aloud; he caught 90% of remaining errors with extra time, min-mod cue for one overlooked mistake. Mixed math problems were >60% correct; adjusted level for home practice. Auditory comprehension of simple news article, with 3/3 comprehension questions correct.      Assessment / Recommendations / Plan   Plan Continue with current plan of care      Progression Toward Goals    Progression toward goals Progressing toward goals              SLP Short Term Goals - 04/02/20 1310      SLP SHORT TERM GOAL #1   Title Patient will participate functionally in 8 minutes simple-mod complex conversation using compensations for anomia as necessary, x3 visits.    Baseline 03/31/20 04/02/20    Time 1    Period Weeks    Status Partially Met      SLP SHORT TERM GOAL #2   Title Pt will demonstrate auditory comprehension of 5 minutes simple-mod complex material by answering [place] questions >90% accuracy x 2 sessions.    Baseline 04/02/20    Time 1    Period Weeks    Status Partially Met      SLP SHORT TERM GOAL #3   Title Pt will participate in assessment of reading, writing and cognition with goals added PRN    Baseline writing assessed 03/12/20; allowing more time for assessment due to anxiety    Time 1    Period Weeks    Status Partially Met            SLP Long Term Goals - 04/02/20 1311      SLP LONG TERM GOAL #1   Title Patient will participate functionally in 15 minutes mod complex-complex conversation using compensations as necessary x 3 visits.    Time 5    Period Weeks    Status On-going      SLP LONG TERM GOAL #2   Title Patient will demonstrate auditory comprehension of 15 minutes mod complex content (conversation, podcast, radio program, etc) x 3 visits by answering detailed questions >90% accuracy.    Time 5    Period Weeks    Status On-going      SLP LONG TERM GOAL #3   Title Patient will report using accessibility features to read and reply to text messages x 3 visits.    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  Suspect mild cognitive deficits. Anxiety continues to improve and is less of a factor in sessions. 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 I recommend skilled ST to maximize communication and cognitive abilities to reduce frustration, improve language abilities, safety, and for possible return to work.    Speech Therapy Frequency 2x / week    Treatment/Interventions Language facilitation;Environmental controls;Cueing hierarchy;SLP instruction and feedback;Cognitive reorganization;Compensatory techniques;Functional tasks;Compensatory strategies;Internal/external aids;Multimodal communcation approach;Patient/family education    Potential to Achieve Goals Good  Patient will benefit from skilled therapeutic intervention in order to improve the following deficits and impairments:   Aphasia    Problem List Patient Active Problem List   Diagnosis Date Noted  . Aortic calcification ([place]) 02/22/2020  . Carotid artery calcification, bilateral 02/22/2020  . Depression   . GERD (gastroesophageal reflux disease)   . Ischemic stroke ([place]), occipital   . Diabetic polyneuropathy associated with type 2 diabetes mellitus ([place]) 06/30/2018  . Other chronic pain 06/30/2018  . Erectile dysfunction due to type 2 diabetes mellitus ([place]) 06/30/2018  . Circulation disorder of lower extremity 05/31/2015  . IBS (irritable bowel syndrome) 12/24/2014  . 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 Diabetic leg ulcer ([place]) 07/18/2013  . Restless legs syndrome (RLS) 11/20/2012  . Generalized anxiety disorder   . Type 2 diabetes mellitus with vascular disease ([place])   . Hyperlipidemia LDL goal <70 01/16/2009  . TOBACCO ABUSE 07/27/2007  . Morbid obesity ([place]) 09/29/2006  . Essential hypertension 09/29/2006  . CALCULUS, KIDNEY 05/30/2002   Deneise Lever, [place], [place] Speech-Language Pathologist  Aliene Altes 04/02/2020, 1:11 PM  [facility] [address] [place], [place], [address] Phone: [phone]   Fax:   [phone]   Name: [name] [name] MRN: [id number] Date of Birth: 09/09/1960

## 2020-04-06 ENCOUNTER — Ambulatory Visit (INDEPENDENT_AMBULATORY_CARE_PROVIDER_SITE_OTHER): Payer: Managed Care, Other (non HMO)

## 2020-04-06 DIAGNOSIS — I639 Cerebral infarction, unspecified: Secondary | ICD-10-CM | POA: Diagnosis not present

## 2020-04-07 ENCOUNTER — Ambulatory Visit: Payer: Managed Care, Other (non HMO) | Admitting: Occupational Therapy

## 2020-04-07 ENCOUNTER — Ambulatory Visit: Payer: Managed Care, Other (non HMO) | Admitting: Physical Therapy

## 2020-04-07 ENCOUNTER — Ambulatory Visit: Payer: Managed Care, Other (non HMO) | Admitting: Speech Pathology

## 2020-04-07 ENCOUNTER — Encounter: Payer: Self-pay | Admitting: Family Medicine

## 2020-04-07 ENCOUNTER — Other Ambulatory Visit: Payer: Self-pay

## 2020-04-07 DIAGNOSIS — R278 Other lack of coordination: Secondary | ICD-10-CM

## 2020-04-07 DIAGNOSIS — R2689 Other abnormalities of gait and mobility: Secondary | ICD-10-CM

## 2020-04-07 DIAGNOSIS — M6281 Muscle weakness (generalized): Secondary | ICD-10-CM

## 2020-04-07 DIAGNOSIS — R4701 Aphasia: Secondary | ICD-10-CM

## 2020-04-07 DIAGNOSIS — R2681 Unsteadiness on feet: Secondary | ICD-10-CM | POA: Diagnosis not present

## 2020-04-07 DIAGNOSIS — I69318 Other symptoms and signs involving cognitive functions following cerebral infarction: Secondary | ICD-10-CM

## 2020-04-07 DIAGNOSIS — R41841 Cognitive communication deficit: Secondary | ICD-10-CM

## 2020-04-07 DIAGNOSIS — R41842 Visuospatial deficit: Secondary | ICD-10-CM

## 2020-04-07 NOTE — Therapy (Signed)
[facility] [address] [place], [place], [address] Phone: [phone]   Fax:  [phone]  Physical Therapy Treatment  Patient Details  Name: [name] [name] MRN: [id number] Date of Birth: 06-Feb-1961 Referring Provider (PT): Dr. Dallas Schimke   Encounter Date: 04/07/2020   PT End of Session - 04/07/20 1410    Visit Number 6    Number of Visits 9    Date for PT Re-Evaluation 04/21/20    Authorization Type Medicare    Progress Note Due on Visit 9    PT Start Time 1145    PT Stop Time 1230    PT Time Calculation (min) 45 min    Equipment Utilized During Treatment Gait belt    Activity Tolerance Patient tolerated treatment well    Behavior During Therapy [facility] for tasks assessed/performed           Past Medical History:  Diagnosis Date  . Angio-edema   . Anxiety   . Depression   . Diabetes mellitus type II    type 2  . GERD (gastroesophageal reflux disease)   . Headache    migraine  . History of kidney stones   . HLD (hyperlipidemia)   . HTN (hypertension)   . Morbid obesity ([facility])   . Restless legs syndrome (RLS) 11/20/2012  . Stroke ([facility]) 02/14/2020   ischemic/right sided affected  . Tobacco abuse   . Ulcer of left ankle (HCC)    last 2 months dime size dry dressing changing q day  . Urticaria     Past Surgical History:  Procedure Laterality Date  . Arm surgery Right 1969   Abscess excision and debridement at elbow  . COLONOSCOPY WITH PROPOFOL N/A 09/06/2016   Procedure: COLONOSCOPY WITH PROPOFOL;  Surgeon: Sherrilyn Rist, MD;  [facility];  Service: Gastroenterology;  Laterality: N/A;  . I & D EXTREMITY Left 05/29/2014   Procedure: IRRIGATION AND DEBRIDEMENT OF LEFT LEG WOUND AND PLACEMENT OF  INTEGRA  AND  VAC;  Surgeon: Wayland Denis, DO;  Location: [facility];  Service: Plastics;  Laterality: Left;  . implantable loop recorder implant  03/06/2020   Medtronic Reveal [place]  model LNQ11 (SN  [id number]) implanted by Dr Johney Frame for cryptogenic stroke  . INCISION AND DRAINAGE OF WOUND Left 04/23/2014   Procedure: IRRIGATION AND DEBRIDEMENT LOWER LEFT LEG WOUND WITH PLACEMENT OF INTEGRA AND VAC;  Surgeon: Wayland Denis, DO;  Location: [facility];  Service: Plastics;  Laterality: Left;  . kidney stone removal  2004  . MINOR APPLICATION OF WOUND VAC Left 04/23/2014   Procedure: MINOR APPLICATION OF WOUND VAC;  Surgeon: Wayland Denis, DO;  Location: [facility];  Service: Plastics;  Laterality: Left;  . SKIN SPLIT GRAFT Left 06/18/2014   Procedure: SKIN GRAFT SPLIT THICKNESS TO LOWER LEFT LEG WOUND WITH PLACEMENT OF VAC;  Surgeon: Wayland Denis, DO;  [facility];  Service: Plastics;  Laterality: Left;  Marland Kitchen VASECTOMY  1994    There were no vitals filed for this visit.   Subjective Assessment - 04/07/20 1227    Subjective Pt states that the theraband around his ankles have been causing some skin breakdown due to the friction. Pt has been performing tband exercises around his knees.    Patient is accompained by: Family member    Pertinent History hypertension, hyperlipidemia, type 2 diabetes mellitus, restless leg syndrome, diabetic neuropathy, morbid obesity, tobacco abuse, depression/anxiety, GERD, chronic venous  stasis ulcer in bilateral lower extremities    Limitations Reading;Walking;House hold activities    How long can you sit comfortably? no issues    How long can you stand comfortably? 10 min    How long can you walk comfortably? 5 min    Diagnostic tests CT head showed Hyperdense right MCA.  CTA head and neck and perfusion study showed 10 cc region of completed infarction in left occipital cortex and additional 11 cc at risk/penumbra. [name] (wife) reports that short term memory is affected.    Currently in Pain? No/denies                   Ambulating outside:  ~1000' total with [facility] CGA for safety. 1  sitting rest break.   ~200' amb with [facility] CGA on grass  Ascend/descend 1%-3% grassy hill with [facility] CGA           Balance Exercises - 04/07/20 0001      Balance Exercises: Standing   Standing Eyes Opened Narrow base of support (BOS);Head turns;Foam/compliant surface;Limitations   2x10 head turns & head nods each   Standing Eyes Closed Narrow base of support (BOS);Foam/compliant surface;30 secs;Limitations;2 reps               PT Short Term Goals - 03/25/20 1131      PT SHORT TERM GOAL #1   Title patient will be able to walk with or without AD for 10 min to improve walking endurance    Baseline <5 min without AD, 12 minutes with SPC    Time 4    Period Weeks    Status Achieved    Target Date 03/24/20             PT Long Term Goals - 02/25/20 1933      PT LONG TERM GOAL #1   Title Pt will be able to ambulate for 30 min with or without AD to improve walking endurance    Baseline <5 min without AD    Time 8    Period Weeks    Status New    Target Date 04/21/20      PT LONG TERM GOAL #2   Title Pt will demo 12 sec or less with 5x/ sit to stand without HHA to improve functional strength    Baseline 15 sec without HHA    Time 8    Period Weeks    Status New    Target Date 04/21/20                 Plan - 04/07/20 1402    Clinical Impression Statement Treatment session focused on pt's endurance, stability/amb on level and unlevel surfaces outside on paved and grassy surfaces. Worked on 1%-3% grade grassy hills. Pt able to perform slowly but safely; however, not yet fully confident. Continued to progress pt's standing balance on compliant surface as well. Discussed changing pt's tband exercises to come around his knees instead of ankles so it doesn't break down his more sensitive/delicate skin around his ankles.    Personal Factors and Comorbidities Comorbidity 3+;Time since onset of injury/illness/exacerbation    Comorbidities hypertension, hyperlipidemia,  type 2 diabetes mellitus, restless leg syndrome, diabetic neuropathy, morbid obesity, tobacco abuse, depression/anxiety, GERD, chronic venous stasis ulcer in bilateral lower extremities    Examination-Activity Limitations Squat;Stand;Stairs    Examination-Participation Restrictions Community Activity;Yard Work;Shop    Rehab Potential Good    PT Frequency 1x / week    PT  Duration 8 weeks    PT Treatment/Interventions ADLs/Self Care Home Management;Gait training;Stair training;Functional mobility training;Therapeutic activities;Therapeutic exercise;Balance training;Neuromuscular re-education;Patient/family education;Manual techniques;Passive range of motion;Energy conservation;Joint Manipulations    PT Next Visit Plan SciFit/NuStep or walking outside for Endurance. Continue exercises for BLE strengthening. Continue exercises to promote Balance Exercises (compliant surfaces). Consider ambulating on compliant surfaces and different levels.    PT Home Exercise Plan Walking program: walking with family member for 5 min and gradually progressing it to 30 min    Consulted and Agree with Plan of Care Patient           Patient will benefit from skilled therapeutic intervention in order to improve the following deficits and impairments:  Abnormal gait, Decreased activity tolerance, Decreased mobility, Decreased endurance, Difficulty walking, Impaired sensation, Impaired vision/preception, Postural dysfunction  Visit Diagnosis: Other lack of coordination  Other symptoms and signs involving cognitive functions following cerebral infarction  Unsteadiness on feet  Muscle weakness (generalized)  Other abnormalities of gait and mobility     Problem List Patient Active Problem List   Diagnosis Date Noted  . Aortic calcification (HCC) 02/22/2020  . Carotid artery calcification, bilateral 02/22/2020  . Depression   . GERD (gastroesophageal reflux disease)   . Ischemic stroke (HCC), occipital   .  Diabetic polyneuropathy associated with type 2 diabetes mellitus (HCC) 06/30/2018  . Other chronic pain 06/30/2018  . Erectile dysfunction due to type 2 diabetes mellitus (HCC) 06/30/2018  . Circulation disorder of lower extremity 05/31/2015  . IBS (irritable bowel syndrome) 12/24/2014  . 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 Diabetic leg ulcer (HCC) 07/18/2013  . Restless legs syndrome (RLS) 11/20/2012  . Generalized anxiety disorder   . Type 2 diabetes mellitus with vascular disease (HCC)   . Hyperlipidemia LDL goal <70 01/16/2009  . TOBACCO ABUSE 07/27/2007  . Morbid obesity (HCC) 09/29/2006  . Essential hypertension 09/29/2006  . CALCULUS, KIDNEY 05/30/2002    [facility] April Ma L Zelphia Glover PT, DPT 04/07/2020, 2:12 PM  [facility] [address] [place], [place], [address] Phone: [phone]   Fax:  [phone]  Name: [name] [name] MRN: [id number] Date of Birth: [birth year]

## 2020-04-07 NOTE — Patient Instructions (Signed)

## 2020-04-07 NOTE — Therapy (Signed)
[facility] [address] [place], [place], [address] Phone: [phone]   Fax:  [phone]  Speech Language Pathology Treatment  Patient Details  Name: [name] [name] MRN: [id number] Date of Birth: May 08, 1961 Referring Provider (SLP): Bonnielee Haff (referring); Copland, Spencer (PCP/documentation)   Encounter Date: 04/07/2020   End of Session - 04/07/20 1515    Visit Number 9    Number of Visits 17    Date for SLP Re-Evaluation 05/02/20    SLP Start Time 50    SLP Stop Time  1445    SLP Time Calculation (min) 43 min    Activity Tolerance Patient tolerated treatment well           Past Medical History:  Diagnosis Date   Angio-edema    Anxiety    Depression    Diabetes mellitus type II    type 2   GERD (gastroesophageal reflux disease)    Headache    migraine   History of kidney stones    HLD (hyperlipidemia)    HTN (hypertension)    Morbid obesity (HCC)    Restless legs syndrome (RLS) 11/20/2012   Stroke ([place]) 02/14/2020   ischemic/right sided affected   Tobacco abuse    Ulcer of left ankle (HCC)    last 2 months dime size dry dressing changing q day   Urticaria     Past Surgical History:  Procedure Laterality Date   Arm surgery Right 1969   Abscess excision and debridement at elbow   COLONOSCOPY WITH PROPOFOL N/A 09/06/2016   Procedure: COLONOSCOPY WITH PROPOFOL;  Surgeon: Doran Stabler, MD;  Location: [facility];  Service: Gastroenterology;  Laterality: N/A;   I & D EXTREMITY Left 05/29/2014   Procedure: IRRIGATION AND DEBRIDEMENT OF LEFT LEG WOUND AND PLACEMENT OF  INTEGRA  AND  VAC;  Surgeon: Theodoro Kos, DO;  Location: [facility];  Service: Plastics;  Laterality: Left;   implantable loop recorder implant  03/06/2020   Medtronic Reveal [place] model LNQ11 (SN  [id number]) implanted by Dr Rayann Heman for cryptogenic stroke   INCISION AND DRAINAGE OF WOUND  Left 04/23/2014   Procedure: IRRIGATION AND DEBRIDEMENT LOWER LEFT LEG WOUND WITH PLACEMENT OF INTEGRA AND VAC;  Surgeon: Theodoro Kos, DO;  Location: [facility];  Service: Plastics;  Laterality: Left;   kidney stone removal  0354   MINOR APPLICATION OF WOUND VAC Left 04/23/2014   Procedure: MINOR APPLICATION OF WOUND VAC;  Surgeon: Theodoro Kos, DO;  Location: [facility];  Service: Plastics;  Laterality: Left;   SKIN SPLIT GRAFT Left 06/18/2014   Procedure: SKIN GRAFT SPLIT THICKNESS TO LOWER LEFT LEG WOUND WITH PLACEMENT OF VAC;  Surgeon: Theodoro Kos, DO;  Location: [facility];  Service: Plastics;  Laterality: Left;   VASECTOMY  1994    There were no vitals filed for this visit.   Subjective Assessment - 04/07/20 1406    Subjective "I'm still seeing the firework."    Currently in Pain? No/denies                 ADULT SLP TREATMENT - 04/07/20 1503      General Information   Behavior/Cognition Alert;Cooperative;Pleasant mood      Treatment Provided   Treatment provided Cognitive-Linquistic      Cognitive-Linquistic Treatment   Treatment focused on Cognition;Patient/family/caregiver education    Skilled Treatment Initiated cognitive-linguistic testing via CLQT; 3 remaining subtests to be completed next  session. Patient tolerated testing well and anxiety did not appear to be a significant factor.11/12 symbols cancelled correctly, 12/12 with extra time. Noted to self-monitor and correct errors on trailmaking today, without cues. Had difficulty with visual memory task, however reported seeing a "firework" and it was difficult for him to visualize stimuli around this. Patient appears to exercise caution and require additional time for processing in visual tasks; SLP suspects this may be appropriate due to visual impairments. Even with caution, he completed trailmaking within the allotted time. SLP educated pt on language deficits vs  non-linguistic cognition, and that "forgetting words" in his case appears to be more language than "memory."       Assessment / Recommendations / [place] with current plan of care      Progression Toward Goals   Progression toward goals Progressing toward goals            SLP Education - 04/07/20 1515    Education Details language vs non-linguistic cognition    Person(s) Educated Patient    Methods Explanation    Comprehension Verbalized understanding            SLP Short Term Goals - 04/07/20 1517      SLP SHORT TERM GOAL #1   Title Patient will participate functionally in 8 minutes simple-mod complex conversation using compensations for anomia as necessary, x3 visits.    Baseline 03/31/20 04/02/20    Time 1    Period Weeks    Status Partially Met      SLP SHORT TERM GOAL #2   Title Pt will demonstrate auditory comprehension of 5 minutes simple-mod complex material by answering [place] questions >90% accuracy x 2 sessions.    Baseline 04/02/20    Time 1    Period Weeks    Status Partially Met      SLP SHORT TERM GOAL #3   Title Pt will participate in assessment of reading, writing and cognition with goals added PRN    Baseline writing assessed 03/12/20; allowing more time for assessment due to anxiety    Time 1    Period Weeks    Status Partially Met            SLP Long Term Goals - 04/07/20 1516      SLP LONG TERM GOAL #1   Title Patient will participate functionally in 15 minutes mod complex-complex conversation using compensations as necessary x 3 visits.    Time 4    Period Weeks    Status On-going      SLP LONG TERM GOAL #2   Title Patient will demonstrate auditory comprehension of 15 minutes mod complex content (conversation, podcast, radio program, etc) x 3 visits by answering detailed questions >90% accuracy.    Time 4    Period Weeks    Status On-going      SLP LONG TERM GOAL #3   Title Patient will report using accessibility features to  read and reply to text messages x 3 visits.    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  Suspect mild cognitive deficits; CLQT initiated today and pt demo'd good self-monitoring and correction of errors. Anxiety continues to improve and is less of a factor  in sessions. 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 I recommend skilled ST to maximize communication and cognitive abilities to reduce frustration, improve language abilities, safety, and for possible return to work.    Speech Therapy Frequency 2x / week    Treatment/Interventions Language facilitation;Environmental controls;Cueing hierarchy;SLP instruction and feedback;Cognitive reorganization;Compensatory techniques;Functional tasks;Compensatory strategies;Internal/external aids;Multimodal communcation approach;Patient/family education    Potential to Achieve Goals Good           Patient will benefit from skilled therapeutic intervention in order to improve the following deficits and impairments:   Cognitive communication deficit  Aphasia    Problem List Patient Active Problem List   Diagnosis Date Noted   Aortic calcification ([facility]) 02/22/2020   Carotid artery calcification, bilateral 02/22/2020   Depression    GERD (gastroesophageal reflux disease)    Ischemic stroke ([place]), occipital    Diabetic polyneuropathy associated with type 2 diabetes mellitus ([facility]) 06/30/2018   Other chronic pain 06/30/2018   Erectile dysfunction due to type 2 diabetes mellitus ([facility]) 06/30/2018   Circulation disorder of lower extremity 05/31/2015   IBS (irritable bowel syndrome) 12/24/2014   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 ([facility]) 04/23/2014   Diabetic leg ulcer ([facility]) 07/18/2013   Restless legs syndrome  (RLS) 11/20/2012   Generalized anxiety disorder    Type 2 diabetes mellitus with vascular disease ([facility])    Hyperlipidemia LDL goal <70 01/16/2009   TOBACCO ABUSE 07/27/2007   Morbid obesity ([facility]) 09/29/2006   Essential hypertension 09/29/2006   CALCULUS, KIDNEY 05/30/2002   Deneise Lever, [place], [place] Speech-Language Pathologist  Aliene Altes 04/07/2020, 3:17 PM  [facility] [address] [place], [place], [address] Phone: [phone]   Fax:  [phone]   Name: Jerimie Mancuso MRN: [id number] Date of Birth: 14-May-1961

## 2020-04-07 NOTE — Therapy (Signed)
[facility] [address] [place], [place], [address] Phone: [phone]   Fax:  [phone]  Occupational Therapy Treatment  Patient Details  Name: [name] [name] MRN: [id number] Date of Birth: 1960/08/13 Referring Provider (OT): Dr. Erlinda Hong   Encounter Date: 04/07/2020   OT End of Session - 04/07/20 1427    Visit Number 5    Number of Visits 16    Date for OT Re-Evaluation 05/17/20    Authorization Type Cigna/[employer] managed    Authorization Time Period 60 days    OT Start Time 1315    OT Stop Time 1400    OT Time Calculation (min) 45 min    Activity Tolerance Patient tolerated treatment well;Other (comment)   Pt became tearful with challenging cognitive activity   Behavior During Therapy WFL for tasks assessed/performed           Past Medical History:  Diagnosis Date  . Angio-edema   . Anxiety   . Depression   . Diabetes mellitus type II    type 2  . GERD (gastroesophageal reflux disease)   . Headache    migraine  . History of kidney stones   . HLD (hyperlipidemia)   . HTN (hypertension)   . Morbid obesity ([place])   . Restless legs syndrome (RLS) 11/20/2012  . Stroke ([facility]) 02/14/2020   ischemic/right sided affected  . Tobacco abuse   . Ulcer of left ankle (HCC)    last 2 months dime size dry dressing changing q day  . Urticaria     Past Surgical History:  Procedure Laterality Date  . Arm surgery Right 1969   Abscess excision and debridement at elbow  . COLONOSCOPY WITH PROPOFOL N/A 09/06/2016   Procedure: COLONOSCOPY WITH PROPOFOL;  Surgeon: Doran Stabler, MD;  Location: [facility];  Service: Gastroenterology;  Laterality: N/A;  . I & D EXTREMITY Left 05/29/2014   Procedure: IRRIGATION AND DEBRIDEMENT OF LEFT LEG WOUND AND PLACEMENT OF  INTEGRA  AND  VAC;  Surgeon: Theodoro Kos, DO;  Location: [facility];  Service: Plastics;  Laterality: Left;  . implantable loop recorder implant   03/06/2020   Medtronic Reveal [place] model LNQ11 (SN  [id number]) implanted by Dr Rayann Heman for cryptogenic stroke  . INCISION AND DRAINAGE OF WOUND Left 04/23/2014   Procedure: IRRIGATION AND DEBRIDEMENT LOWER LEFT LEG WOUND WITH PLACEMENT OF INTEGRA AND VAC;  Surgeon: Theodoro Kos, DO;  Location: [facility];  Service: Plastics;  Laterality: Left;  . kidney stone removal  2004  . MINOR APPLICATION OF WOUND VAC Left 04/23/2014   Procedure: MINOR APPLICATION OF WOUND VAC;  Surgeon: Theodoro Kos, DO;  Location: [facility];  Service: Plastics;  Laterality: Left;  . SKIN SPLIT GRAFT Left 06/18/2014   Procedure: SKIN GRAFT SPLIT THICKNESS TO LOWER LEFT LEG WOUND WITH PLACEMENT OF VAC;  Surgeon: Theodoro Kos, DO;  Location: [facility];  Service: Plastics;  Laterality: Left;  Marland Kitchen VASECTOMY  1994    There were no vitals filed for this visit.   Subjective Assessment - 04/07/20 1317    Subjective  I'm a little sore from walking a lot earlier but not really pain    Pertinent History CVA 02/14/20 Lt occipital lobe. PMH: 2 small previous strokes, DM, GERD, HLD, HTN (but now has issues w/ orthostasis)    Limitations no driving, loop recorder    Currently in Pain? No/denies    Pain Onset More  than a month ago           4 step sequencing cards with 100% accuracy. 8 step sequencing cards w/ 100% accuracy after min cues.  Tabletop scanning: letter cancellation (29M print size) with approx 80% accuracy. Environmental scanning finding 12/13 items on first pass (92% accuracy) missing 1 higher item.  Negotiating tight spaces with safety and no noticeable bumping into things. Issued and reviewed memory strategies                     OT Education - 04/07/20 1335    Education Details memory strategies    Person(s) Educated Patient    Methods Explanation;Handout    Comprehension Verbalized understanding            OT Short Term Goals - 04/07/20  1427      OT SHORT TERM GOAL #1   Title Pt will be independent with HEP for Rt hand coordination and Rt shoulder (low range strengthening)    Time 4    Period Weeks    Status On-going   MET for coordination     OT SHORT TERM GOAL #2   Title Pt will be independent with visual scanning strategies to compensate for Rt visual field cut    Time 4    Period Weeks    Status Achieved      OT SHORT TERM GOAL #3   Title Improve coordination Rt hand to 28 sec or less for high level tasks    Baseline 33.91 sec    Time 4    Period Weeks    Status New      OT SHORT TERM GOAL #4   Title Pt will verbalize understanding with memory compensatory strategies and utilize for medication and financial management    Time 4    Period Weeks    Status On-going      OT SHORT TERM GOAL #5   Title Pt will perform simple financial management tasks using calculator prn with supervision and no more than min v.c's    Time 4    Period Weeks      OT SHORT TERM GOAL #6   Title Pt will perform simple stovetop task with supervision only    Time 4    Period Weeks    Status New      OT SHORT TERM GOAL #7   Title Pt will perform tabletop scanning with 100% accuracy and environmental scanning with 75% or greater accuracy    Time 4    Period Weeks    Status Partially Met   met w/ environmental scanning (92%), but inconsistent with tabletop scanning            OT Long Term Goals - 03/17/20 1449      OT LONG TERM GOAL #1   Title Pt to return to cooking with distant supervision    Time 8    Period Weeks    Status New      OT LONG TERM GOAL #2   Title Pt will complete meal plan for the week and develop corresponding grocery list I'ly    Time 8    Period Weeks    Status New      OT LONG TERM GOAL #3   Title Pt will perform full financial management tasks at 100% accuracy with no cues    Time 8    Period Weeks    Status New  OT LONG TERM GOAL #4   Title Pt will demo organizational and problem  solving skills to complete and adapt a schedule prn    Time 8    Period Weeks    Status New      OT LONG TERM GOAL #5   Title Pt will perform environmental scanning with 90% or greater accuracy    Time 8    Period Weeks    Status New                 Plan - 04/07/20 1433    Clinical Impression Statement Pt progressing with environmental scanning and negotiating tight spaces safely    OT Occupational Profile and History Detailed Assessment- Review of Records and additional review of physical, cognitive, psychosocial history related to current functional performance    Occupational performance deficits (Please refer to evaluation for details): ADL's;IADL's;Work;Leisure    Body Structure / Function / Physical Skills ADL;Strength;Balance;UE functional use;IADL;Vision;Coordination;Mobility;Sensation;FMC    Cognitive Skills Memory;Perception;Problem Solve;Sequencing    Rehab Potential Good    Clinical Decision Making Several treatment options, min-mod task modification necessary    Comorbidities Affecting Occupational Performance: May have comorbidities impacting occupational performance    Modification or Assistance to Complete Evaluation  Min-Moderate modification of tasks or assist with assess necessary to complete eval    OT Frequency 2x / week    OT Duration 8 weeks   plus eval   OT Treatment/Interventions Self-care/ADL training;DME and/or AE instruction;Therapeutic activities;Therapeutic exercise;Cognitive remediation/compensation;Coping strategies training;Functional Mobility Training;Neuromuscular education;Visual/perceptual remediation/compensation;Patient/family education    Plan financial management task, progress towards remaining STG's    Consulted and Agree with Plan of Care Patient;Family member/caregiver    Family Member Consulted wife           Patient will benefit from skilled therapeutic intervention in order to improve the following deficits and impairments:    Body Structure / Function / Physical Skills: ADL, Strength, Balance, UE functional use, IADL, Vision, Coordination, Mobility, Sensation, [facility] Cognitive Skills: Memory, Perception, Problem Solve, Sequencing     Visit Diagnosis: Other symptoms and signs involving cognitive functions following cerebral infarction  Visuospatial deficit    Problem List Patient Active Problem List   Diagnosis Date Noted  . Aortic calcification ([place]) 02/22/2020  . Carotid artery calcification, bilateral 02/22/2020  . Depression   . GERD (gastroesophageal reflux disease)   . Ischemic stroke ([place]), occipital   . Diabetic polyneuropathy associated with type 2 diabetes mellitus ([facility]) 06/30/2018  . Other chronic pain 06/30/2018  . Erectile dysfunction due to type 2 diabetes mellitus ([facility]) 06/30/2018  . Circulation disorder of lower extremity 05/31/2015  . IBS (irritable bowel syndrome) 12/24/2014  . 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 Diabetic leg ulcer ([place]) 07/18/2013  . Restless legs syndrome (RLS) 11/20/2012  . Generalized anxiety disorder   . Type 2 diabetes mellitus with vascular disease ([place])   . Hyperlipidemia LDL goal <70 01/16/2009  . TOBACCO ABUSE 07/27/2007  . Morbid obesity ([facility]) 09/29/2006  . Essential hypertension 09/29/2006  . CALCULUS, KIDNEY 05/30/2002    Carey Bullocks, OTR/L 04/07/2020, 2:35 PM  [facility] [address] [place], [place], [address] Phone: [phone]   Fax:  [phone]  Name: [name] [name] MRN: [id number] Date of Birth: [birth year]

## 2020-04-09 ENCOUNTER — Other Ambulatory Visit: Payer: Self-pay

## 2020-04-09 ENCOUNTER — Ambulatory Visit: Payer: Managed Care, Other (non HMO) | Admitting: Occupational Therapy

## 2020-04-09 ENCOUNTER — Ambulatory Visit: Payer: Managed Care, Other (non HMO) | Admitting: Physical Therapy

## 2020-04-09 ENCOUNTER — Ambulatory Visit: Payer: Managed Care, Other (non HMO) | Admitting: Speech Pathology

## 2020-04-09 DIAGNOSIS — I69318 Other symptoms and signs involving cognitive functions following cerebral infarction: Secondary | ICD-10-CM

## 2020-04-09 DIAGNOSIS — R2681 Unsteadiness on feet: Secondary | ICD-10-CM | POA: Diagnosis not present

## 2020-04-09 DIAGNOSIS — R41842 Visuospatial deficit: Secondary | ICD-10-CM

## 2020-04-09 DIAGNOSIS — R4701 Aphasia: Secondary | ICD-10-CM

## 2020-04-09 DIAGNOSIS — R41841 Cognitive communication deficit: Secondary | ICD-10-CM

## 2020-04-09 LAB — CUP PACEART REMOTE DEVICE CHECK
Date Time Interrogation Session: 20211110102152
Implantable Pulse Generator Implant Date: 20211008

## 2020-04-09 NOTE — Therapy (Signed)
[facility] [address] [place], [place], [address] Phone: [phone]   Fax:  [phone]  Occupational Therapy Treatment  Patient Details  Name: [name] [name] MRN: [id number] Date of Birth: 09/07/1960 Referring Provider (OT): Dr. Erlinda Hong   Encounter Date: 04/09/2020   OT End of Session - 04/09/20 1509    Visit Number 6    Number of Visits 16    Date for OT Re-Evaluation 05/17/20    Authorization Type Cigna/C[employer]a managed    Authorization Time Period 60 days    OT Start Time 1450    OT Stop Time 1530    OT Time Calculation (min) 40 min    Activity Tolerance Patient tolerated treatment well;Other (comment)   Pt became tearful with challenging cognitive activity   Behavior During Therapy WFL for tasks assessed/performed           Past Medical History:  Diagnosis Date  . Angio-edema   . Anxiety   . Depression   . Diabetes mellitus type II    type 2  . GERD (gastroesophageal reflux disease)   . Headache    migraine  . History of kidney stones   . HLD (hyperlipidemia)   . HTN (hypertension)   . Morbid obesity ([place])   . Restless legs syndrome (RLS) 11/20/2012  . Stroke ([facility]) 02/14/2020   ischemic/right sided affected  . Tobacco abuse   . Ulcer of left ankle (HCC)    last 2 months dime size dry dressing changing q day  . Urticaria     Past Surgical History:  Procedure Laterality Date  . Arm surgery Right 1969   Abscess excision and debridement at elbow  . COLONOSCOPY WITH PROPOFOL N/A 09/06/2016   Procedure: COLONOSCOPY WITH PROPOFOL;  Surgeon: Doran Stabler, MD;  Location: [facility];  Service: Gastroenterology;  Laterality: N/A;  . I & D EXTREMITY Left 05/29/2014   Procedure: IRRIGATION AND DEBRIDEMENT OF LEFT LEG WOUND AND PLACEMENT OF  INTEGRA  AND  VAC;  Surgeon: Theodoro Kos, DO;  Location: [facility];  Service: Plastics;  Laterality: Left;  . implantable loop recorder implant   03/06/2020   Medtronic Reveal [place] model LNQ11 (SN  [id number]) implanted by Dr Rayann Heman for cryptogenic stroke  . INCISION AND DRAINAGE OF WOUND Left 04/23/2014   Procedure: IRRIGATION AND DEBRIDEMENT LOWER LEFT LEG WOUND WITH PLACEMENT OF INTEGRA AND VAC;  Surgeon: Theodoro Kos, DO;  Location: [facility];  Service: Plastics;  Laterality: Left;  . kidney stone removal  2004  . MINOR APPLICATION OF WOUND VAC Left 04/23/2014   Procedure: MINOR APPLICATION OF WOUND VAC;  Surgeon: Theodoro Kos, DO;  Location: [facility];  Service: Plastics;  Laterality: Left;  . SKIN SPLIT GRAFT Left 06/18/2014   Procedure: SKIN GRAFT SPLIT THICKNESS TO LOWER LEFT LEG WOUND WITH PLACEMENT OF VAC;  Surgeon: Theodoro Kos, DO;  Location: [facility];  Service: Plastics;  Laterality: Left;  Marland Kitchen VASECTOMY  1994    There were no vitals filed for this visit.   Subjective Assessment - 04/09/20 1508    Pertinent History CVA 02/14/20 Lt occipital lobe. PMH: 2 small previous strokes, DM, GERD, HLD, HTN (but now has issues w/ orthostasis)    Limitations no driving, loop recorder    Currently in Pain? No/denies    Pain Onset More than a month ago          Continued letter cancellation with approx  80% accuracy with errors d/t vision and aphasia. Pt often would mistake "y" for "v" and "g" for "q" Simple money exchange worksheet at 80% accuracy (2 minor errors out of 10). Pt then balancing 2 deposits and 5 debits using calculator with extra time and min cues - pt occasionally needed min cues to copy number correctly from calculator to paper (partly d/t glare) - pt overall did quite well with task with extra time.                         OT Short Term Goals - 04/07/20 1427      OT SHORT TERM GOAL #1   Title Pt will be independent with HEP for Rt hand coordination and Rt shoulder (low range strengthening)    Time 4    Period Weeks    Status On-going   MET for  coordination     OT SHORT TERM GOAL #2   Title Pt will be independent with visual scanning strategies to compensate for Rt visual field cut    Time 4    Period Weeks    Status Achieved      OT SHORT TERM GOAL #3   Title Improve coordination Rt hand to 28 sec or less for high level tasks    Baseline 33.91 sec    Time 4    Period Weeks    Status New      OT SHORT TERM GOAL #4   Title Pt will verbalize understanding with memory compensatory strategies and utilize for medication and financial management    Time 4    Period Weeks    Status On-going      OT SHORT TERM GOAL #5   Title Pt will perform simple financial management tasks using calculator prn with supervision and no more than min v.c's    Time 4    Period Weeks      OT SHORT TERM GOAL #6   Title Pt will perform simple stovetop task with supervision only    Time 4    Period Weeks    Status New      OT SHORT TERM GOAL #7   Title Pt will perform tabletop scanning with 100% accuracy and environmental scanning with 75% or greater accuracy    Time 4    Period Weeks    Status Partially Met   met w/ environmental scanning (92%), but inconsistent with tabletop scanning            OT Long Term Goals - 03/17/20 1449      OT LONG TERM GOAL #1   Title Pt to return to cooking with distant supervision    Time 8    Period Weeks    Status New      OT LONG TERM GOAL #2   Title Pt will complete meal plan for the week and develop corresponding grocery list I'ly    Time 8    Period Weeks    Status New      OT LONG TERM GOAL #3   Title Pt will perform full financial management tasks at 100% accuracy with no cues    Time 8    Period Weeks    Status New      OT LONG TERM GOAL #4   Title Pt will demo organizational and problem solving skills to complete and adapt a schedule prn    Time 8    Period Weeks  Status New      OT LONG TERM GOAL #5   Title Pt will perform environmental scanning with 90% or greater accuracy     Time 8    Period Weeks    Status New                 Plan - 04/09/20 1538    Clinical Impression Statement Pt progressing towards goals. Pt limited by aphasia, visual deficits, and decreased processing speed    OT Occupational Profile and History Detailed Assessment- Review of Records and additional review of physical, cognitive, psychosocial history related to current functional performance    Occupational performance deficits (Please refer to evaluation for details): ADL's;IADL's;Work;Leisure    Body Structure / Function / Physical Skills ADL;Strength;Balance;UE functional use;IADL;Vision;Coordination;Mobility;Sensation;FMC    Cognitive Skills Memory;Perception;Problem Solve;Sequencing    Rehab Potential Good    Clinical Decision Making Several treatment options, min-mod task modification necessary    Comorbidities Affecting Occupational Performance: May have comorbidities impacting occupational performance    Modification or Assistance to Complete Evaluation  Min-Moderate modification of tasks or assist with assess necessary to complete eval    OT Frequency 2x / week    OT Duration 8 weeks   plus eval   OT Treatment/Interventions Self-care/ADL training;DME and/or AE instruction;Therapeutic activities;Therapeutic exercise;Cognitive remediation/compensation;Coping strategies training;Functional Mobility Training;Neuromuscular education;Visual/perceptual remediation/compensation;Patient/family education    Plan check STG #3, review memory strategies and how to apply to both medications and financial mngmt - STG #4, theraband low range strengthening    Consulted and Agree with Plan of Care Patient;Family member/caregiver    Family Member Consulted wife           Patient will benefit from skilled therapeutic intervention in order to improve the following deficits and impairments:   Body Structure / Function / Physical Skills: ADL, Strength, Balance, UE functional use, IADL,  Vision, Coordination, Mobility, Sensation, [facility] Cognitive Skills: Memory, Perception, Problem Solve, Sequencing     Visit Diagnosis: Other symptoms and signs involving cognitive functions following cerebral infarction  Visuospatial deficit    Problem List Patient Active Problem List   Diagnosis Date Noted  . Aortic calcification ([facility]) 02/22/2020  . Carotid artery calcification, bilateral 02/22/2020  . Depression   . GERD (gastroesophageal reflux disease)   . Ischemic stroke ([facility]), occipital   . Diabetic polyneuropathy associated with type 2 diabetes mellitus ([place] [place]) 06/30/2018  . Other chronic pain 06/30/2018  . Erectile dysfunction due to type 2 diabetes mellitus ([facility]) 06/30/2018  . Circulation disorder of lower extremity 05/31/2015  . IBS (irritable bowel syndrome) 12/24/2014  . 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 Diabetic leg ulcer ([facility]) 07/18/2013  . Restless legs syndrome (RLS) 11/20/2012  . Generalized anxiety disorder   . Type 2 diabetes mellitus with vascular disease ([facility])   . Hyperlipidemia LDL goal <70 01/16/2009  . TOBACCO ABUSE 07/27/2007  . Morbid obesity ([facility]) 09/29/2006  . Essential hypertension 09/29/2006  . CALCULUS, KIDNEY 05/30/2002    Carey Bullocks, OTR/L 04/09/2020, 3:46 PM  [facility] [address] [place], [place], [address] Phone: [phone]   Fax:  [phone]  Name: [name] [name] MRN: [id number] Date of Birth: 1961/05/22

## 2020-04-09 NOTE — Therapy (Signed)
[facility] [address] [place], [place], [address] Phone: [phone]   Fax:  [phone]  Speech Language Pathology Treatment  Patient Details  Name: [name] [name] MRN: [id number] Date of Birth: 15-Sep-1960 Referring Provider (SLP): Bonnielee Haff (referring); Copland, Frederico Hamman (PCP/documentation)   Encounter Date: 04/09/2020   End of Session - 04/09/20 1506    Visit Number 10    Number of Visits 17    Date for SLP Re-Evaluation 05/02/20    SLP Start Time 33    SLP Stop Time  1450    SLP Time Calculation (min) 48 min    Activity Tolerance Patient tolerated treatment well           Past Medical History:  Diagnosis Date  . Angio-edema   . Anxiety   . Depression   . Diabetes mellitus type II    type 2  . GERD (gastroesophageal reflux disease)   . Headache    migraine  . History of kidney stones   . HLD (hyperlipidemia)   . HTN (hypertension)   . Morbid obesity ([facility])   . Restless legs syndrome (RLS) 11/20/2012  . Stroke ([facility]) 02/14/2020   ischemic/right sided affected  . Tobacco abuse   . Ulcer of left ankle (HCC)    last 2 months dime size dry dressing changing q day  . Urticaria     Past Surgical History:  Procedure Laterality Date  . Arm surgery Right 1969   Abscess excision and debridement at elbow  . COLONOSCOPY WITH PROPOFOL N/A 09/06/2016   Procedure: COLONOSCOPY WITH PROPOFOL;  Surgeon: Doran Stabler, MD;  [facility];  Service: Gastroenterology;  Laterality: N/A;  . I & D EXTREMITY Left 05/29/2014   Procedure: IRRIGATION AND DEBRIDEMENT OF LEFT LEG WOUND AND PLACEMENT OF  INTEGRA  AND  VAC;  Surgeon: Theodoro Kos, DO;  Location: [facility];  Service: Plastics;  Laterality: Left;  . implantable loop recorder implant  03/06/2020   Medtronic Reveal [place] model LNQ11 (SN  [id number]) implanted by Dr Rayann Heman for cryptogenic stroke  . INCISION AND DRAINAGE OF WOUND  Left 04/23/2014   Procedure: IRRIGATION AND DEBRIDEMENT LOWER LEFT LEG WOUND WITH PLACEMENT OF INTEGRA AND VAC;  Surgeon: Theodoro Kos, DO;  Location: [facility];  Service: Plastics;  Laterality: Left;  . kidney stone removal  2004  . MINOR APPLICATION OF WOUND VAC Left 04/23/2014   Procedure: MINOR APPLICATION OF WOUND VAC;  Surgeon: Theodoro Kos, DO;  Location: [facility];  Service: Plastics;  Laterality: Left;  . SKIN SPLIT GRAFT Left 06/18/2014   Procedure: SKIN GRAFT SPLIT THICKNESS TO LOWER LEFT LEG WOUND WITH PLACEMENT OF VAC;  Surgeon: Theodoro Kos, DO;  Location: [facility];  Service: Plastics;  Laterality: Left;  Marland Kitchen VASECTOMY  1994    There were no vitals filed for this visit.   Subjective Assessment - 04/09/20 1454    Subjective "I forgot my notebook today."    Currently in Pain? No/denies                 ADULT SLP TREATMENT - 04/09/20 1454      General Information   Behavior/Cognition Alert;Cooperative;Pleasant mood      Treatment Provided   Treatment provided Cognitive-Linquistic      Pain Assessment   Pain Assessment No/denies pain      Cognitive-Linquistic Treatment   Treatment focused on Cognition;Patient/family/caregiver education    Skilled  Treatment Completed cognitive-linguistic testing via CLQT; pt with mild deficits in memory, language and visuospatial skills. Attention, executive function scores WNL. Slow processing noted throughout. We discussed deficit areas and language vs cognition. Pt aware of deficits and appeared anxious when discussing how this would affect him at work. Pt noted that difficulty remembering protocols for audits and slower processing speed would keep him from performing at necessary standard for work, and SLP agreed with him about this. Encouraged pt that we will continue working on deficit areas and will make recommendations as appropriate to his MD re: return to work. Goals updated.        Assessment / Recommendations / Plan   Plan Continue with current plan of care      Progression Toward Goals   Progression toward goals Progressing toward goals            SLP Education - 04/09/20 1505    Education Details deficit areas, how this may impact him at work    [employer]) Educated Patient    Methods Explanation    Comprehension Verbalized understanding            SLP Short Term Goals - 04/09/20 1511      SLP SHORT TERM GOAL #1   Title Patient will participate functionally in 8 minutes simple-mod complex conversation using compensations for anomia as necessary, x3 visits.    Baseline 03/31/20 04/02/20    Time 1    Period Weeks    Status Partially Met      SLP SHORT TERM GOAL #2   Title Pt will demonstrate auditory comprehension of 5 minutes simple-mod complex material by answering [place] questions >90% accuracy x 2 sessions.    Baseline 04/02/20    Time 1    Period Weeks    Status Partially Met      SLP SHORT TERM GOAL #3   Title Pt will participate in assessment of reading, writing and cognition with goals added PRN    Baseline writing assessed 03/12/20; allowing more time for assessment due to anxiety    Time 1    Period Weeks    Status Partially Met            SLP Long Term Goals - 04/09/20 1512      SLP LONG TERM GOAL #1   Title Patient will participate functionally in 15 minutes mod complex-complex conversation using compensations as necessary x 3 visits.    Time 4    Period Weeks    Status On-going      SLP LONG TERM GOAL #2   Title Patient will demonstrate auditory comprehension of 15 minutes mod complex content (conversation, podcast, radio program, etc) x 3 visits by answering detailed questions >90% accuracy.    Time 4    Period Weeks    Status On-going      SLP LONG TERM GOAL #3   Title Patient will report using accessibility features to read and reply to text messages x 3 visits.    Time 4    Period Weeks    Status On-going       SLP LONG TERM GOAL #4   Title Pt will use compensations for processing mod complex written/verbal information with mod I x 2 sessions.    Time 4    Period Weeks    Status New      SLP LONG TERM GOAL #5   Title Pt will complete mod complex functional problem solving activities >90% accuracy with  extra time.    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 Error awareness/correction is a relative strength, however [name] [name] has slow processing, impaired problem solving, memory, and visuospatial skills.  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 [name] [name] remains motivated for progress, and works diligently outside of therapy on tasks assigned in session and therapy apps on his tablet. I recommend skilled ST to maximize communication and cognitive abilities to reduce frustration, improve language abilities, safety, and for possible return to work.    Speech Therapy Frequency 2x / week    Treatment/Interventions Language facilitation;Environmental controls;Cueing hierarchy;SLP instruction and feedback;Cognitive reorganization;Compensatory techniques;Functional tasks;Compensatory strategies;Internal/external aids;Multimodal communcation approach;Patient/family education    Potential to Achieve Goals Good           Patient will benefit from skilled therapeutic intervention in order to improve the following deficits and impairments:   Cognitive communication deficit  Aphasia    Problem List Patient Active Problem List   Diagnosis Date Noted  . Aortic calcification ([facility]) 02/22/2020  . Carotid artery calcification, bilateral 02/22/2020  . Depression   . GERD (gastroesophageal reflux disease)   .  Ischemic stroke ([place]), occipital   . Diabetic polyneuropathy associated with type 2 diabetes mellitus ([facility]) 06/30/2018  . Other chronic pain 06/30/2018  . Erectile dysfunction due to type 2 diabetes mellitus ([facility]) 06/30/2018  . Circulation disorder of lower extremity 05/31/2015  . IBS (irritable bowel syndrome) 12/24/2014  . 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 Diabetic leg ulcer ([place]) 07/18/2013  . Restless legs syndrome (RLS) 11/20/2012  . Generalized anxiety disorder   . Type 2 diabetes mellitus with vascular disease ([place])   . Hyperlipidemia LDL goal <70 01/16/2009  . TOBACCO ABUSE 07/27/2007  . Morbid obesity ([place]) 09/29/2006  . Essential hypertension 09/29/2006  . CALCULUS, KIDNEY 05/30/2002   Deneise Lever, [place], [place] Speech-Language Pathologist  Aliene Altes 04/09/2020, 3:15 PM  [facility] [address] [place], [place], [address] Phone: [phone]   Fax:  [phone]   Name: [name] [name] MRN: [id number] Date of Birth: [birth year]

## 2020-04-10 ENCOUNTER — Ambulatory Visit (INDEPENDENT_AMBULATORY_CARE_PROVIDER_SITE_OTHER): Payer: Managed Care, Other (non HMO) | Admitting: Endocrinology

## 2020-04-10 ENCOUNTER — Encounter: Payer: Self-pay | Admitting: Endocrinology

## 2020-04-10 VITALS — HR 94 | Wt 307.0 lb

## 2020-04-10 DIAGNOSIS — E11622 Type 2 diabetes mellitus with other skin ulcer: Secondary | ICD-10-CM | POA: Diagnosis not present

## 2020-04-10 DIAGNOSIS — L97929 Non-pressure chronic ulcer of unspecified part of left lower leg with unspecified severity: Secondary | ICD-10-CM

## 2020-04-10 LAB — POCT GLYCOSYLATED HEMOGLOBIN (HGB A1C): Hemoglobin A1C: 6.7 % — AB (ref 4.0–5.6)

## 2020-04-10 MED ORDER — GLIPIZIDE ER 10 MG PO TB24
10.0000 mg | ORAL_TABLET | ORAL | 3 refills | Status: DC
Start: 2020-04-10 — End: 2020-04-21

## 2020-04-10 NOTE — Patient Instructions (Addendum)
I have sent a prescription to your pharmacy, to reduce the glipizide to 1 pill each morning.  Please continue the same other diabetes medications.  check your blood sugar once a day.  vary the time of day when you check, between before the 3 meals, and at bedtime.  also check if you have symptoms of your blood sugar being too high or too low.  please keep a record of the readings and bring it to your next appointment here (or you can bring the meter itself).  You can write it on any piece of paper.  please call us sooner if your blood sugar goes below 70, or if you have a lot of readings over 200.  Please come back for a follow-up appointment in 3 months.

## 2020-04-10 NOTE — Progress Notes (Signed)
[facility] Summary Report / Loop Recorder 

## 2020-04-10 NOTE — Progress Notes (Signed)
Subjective:    Patient ID: [name] [name], male    DOB: 08/22/1960, 59 y.o.   MRN: [id number]  HPI Pt returns for f/u of diabetes mellitus: DM type: 2 Dx'ed: 2010 Complications: PN, CRI, CVA, and foot ulcers.   Therapy: trulicity and 4 oral meds. DKA: never Severe hypoglycemia: once, in 2016 Pancreatitis: never Pancreatic imaging: never SDOH: wife provides most of hx, due to pt's difficulty with concentration.   Other: he has never been on insulin; wife is nurse, and pt has also administered insulin to his father; edema limits rx options.   Interval history: He brings a record of his cbg's which I have reviewed today.  cbg varies from 67-166.  Pt is here with wife today.  nausea is much less now.   Past Medical History:  Diagnosis Date  . Angio-edema   . Anxiety   . Depression   . Diabetes mellitus type II    type 2  . GERD (gastroesophageal reflux disease)   . Headache    migraine  . History of kidney stones   . HLD (hyperlipidemia)   . HTN (hypertension)   . Morbid obesity (HCC)   . Restless legs syndrome (RLS) 11/20/2012  . Stroke (HCC) 02/14/2020   ischemic/right sided affected  . Tobacco abuse   . Ulcer of left ankle (HCC)    last 2 months dime size dry dressing changing q day  . Urticaria     Past Surgical History:  Procedure Laterality Date  . Arm surgery Right 1969   Abscess excision and debridement at elbow  . COLONOSCOPY WITH PROPOFOL N/A 09/06/2016   Procedure: COLONOSCOPY WITH PROPOFOL;  Surgeon: Sherrilyn Rist, MD;  Location: [facility];  Service: Gastroenterology;  Laterality: N/A;  . I & D EXTREMITY Left 05/29/2014   Procedure: IRRIGATION AND DEBRIDEMENT OF LEFT LEG WOUND AND PLACEMENT OF  INTEGRA  AND  VAC;  Surgeon: Wayland Denis, DO;  Location: [facility];  Service: Plastics;  Laterality: Left;  . implantable loop recorder implant  03/06/2020   Medtronic Reveal [place] model LNQ11 (SN  [id number]) implanted by Dr Johney Frame for  cryptogenic stroke  . INCISION AND DRAINAGE OF WOUND Left 04/23/2014   Procedure: IRRIGATION AND DEBRIDEMENT LOWER LEFT LEG WOUND WITH PLACEMENT OF INTEGRA AND VAC;  Surgeon: Wayland Denis, DO;  Location: [facility];  Service: Plastics;  Laterality: Left;  . kidney stone removal  2004  . MINOR APPLICATION OF WOUND VAC Left 04/23/2014   Procedure: MINOR APPLICATION OF WOUND VAC;  Surgeon: Wayland Denis, DO;  Location: [facility];  Service: Plastics;  Laterality: Left;  . SKIN SPLIT GRAFT Left 06/18/2014   Procedure: SKIN GRAFT SPLIT THICKNESS TO LOWER LEFT LEG WOUND WITH PLACEMENT OF VAC;  Surgeon: Wayland Denis, DO;  Location: [facility];  Service: Plastics;  Laterality: Left;  Marland Kitchen VASECTOMY  1994    Social History   Socioeconomic History  . Marital status: Married    Spouse name: Banker  . Number of children: 3  . Years of education: Not on file  . Highest education level: Not on file  Occupational History  . 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 Smoking status: Current Every Day Smoker    Packs/day: 0.25    Years: 40.00    Pack years: 10.00    Types: Cigarettes  . Smokeless tobacco: Never Used  . Tobacco comment: one cigarette daily  Substance and  Sexual Activity  . Alcohol use: Yes    Alcohol/week: 0.0 standard drinks    Comment: rarely  . Drug use: No  . Sexual activity: Yes    Partners: Female  Other Topics Concern  . Not on file  Social History Narrative   Lives with wife   Right Handed   Drink 6-7 cups caffeine daily   Social Determinants of Health   Financial Resource Strain:   . Difficulty of Paying Living Expenses: Not on file  Food Insecurity:   . Worried About Programme researcher, broadcasting/film/video in the Last Year: Not on file  . Ran Out of Food in the Last Year: Not on file  Transportation Needs:   . Lack of Transportation (Medical): Not on file  . Lack of Transportation (Non-Medical): Not on  file  Physical Activity:   . Days of Exercise per Week: Not on file  . Minutes of Exercise per Session: Not on file  Stress:   . Feeling of Stress : Not on file  Social Connections:   . Frequency of Communication with Friends and Family: Not on file  . Frequency of Social Gatherings with Friends and Family: Not on file  . Attends Religious Services: Not on file  . Active Member of Clubs or Organizations: Not on file  . Attends Banker Meetings: Not on file  . Marital Status: Not on file  Intimate Partner Violence:   . Fear of Current or Ex-Partner: Not on file  . Emotionally Abused: Not on file  . Physically Abused: Not on file  . Sexually Abused: Not on file    Current Outpatient Medications on File Prior to Visit  Medication Sig Dispense Refill  . acetaminophen (TYLENOL) 500 MG tablet Take 1,000 mg by mouth 2 (two) times daily as needed (for pain).    Marland Kitchen aspirin EC 81 MG EC tablet Take 1 tablet (81 mg total) by mouth daily. Swallow whole. 30 tablet 3  . bromocriptine (PARLODEL) 2.5 MG tablet Take 0.5 tablets (1.25 mg total) by mouth at bedtime. 45 tablet 3  . colesevelam (WELCHOL) 625 MG tablet Take 2 tablets (1,250 mg total) by mouth daily. 60 tablet 11  . Dulaglutide (TRULICITY) 4.5 MG/0.5ML SOPN Inject 4.5 mg into the skin once a week. 12 pen 3  . EPINEPHrine 0.3 mg/0.3 mL IJ SOAJ injection INJECT 0.3 ML INTO THE  MUSCLE ONCE FOR 1 DOSE. 2 each 0  . FARXIGA 10 MG TABS tablet TAKE 1 TABLET BY MOUTH DAILY BEFORE BREAKFAST 30 tablet 11  . glucose blood (CONTOUR NEXT TEST) test strip Use to check blood sugar two times a day. 100 each 5  . Lancets Micro Thin 33G MISC Use to check blood sugar two times daily. 100 each 5  . lisinopril-hydrochlorothiazide (ZESTORETIC) 20-12.5 MG tablet Take 0.5 tablets by mouth daily. 45 tablet 3  . metFORMIN (GLUCOPHAGE-XR) 500 MG 24 hr tablet TAKE 4 TABLETS BY MOUTH  DAILY WITH BREAKFAST 360 tablet 3  . Multiple Vitamin (MULTIVITAMIN)  tablet Take 1 tablet by mouth daily.      . pantoprazole (PROTONIX) 40 MG tablet TAKE 1 TABLET BY MOUTH  DAILY 90 tablet 3  . PARoxetine (PAXIL) 30 MG tablet TAKE 1 TABLET BY MOUTH IN  THE MORNING 90 tablet 3  . pentoxifylline (TRENTAL) 400 MG CR tablet TAKE 1 TABLET BY MOUTH 3  TIMES DAILY WITH MEALS 270 tablet 3  . pregabalin (LYRICA) 75 MG capsule Take 1 capsule (75 mg total)  by mouth 2 (two) times daily. 60 capsule 1  . rOPINIRole (REQUIP) 0.5 MG tablet TAKE 1 TABLET BY MOUTH  TWICE DAILY 180 tablet 3  . rosuvastatin (CRESTOR) 20 MG tablet Take 1 tablet (20 mg total) by mouth at bedtime. 30 tablet 2  . sildenafil (VIAGRA) 100 MG tablet Take 1 tablet (100 mg total) by mouth daily as needed for erectile dysfunction. 10 tablet 5   No current facility-administered medications on file prior to visit.    Allergies  Allergen Reactions  . Cashew Nut Oil Anaphylaxis    Cashews, 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  Marland Kitchen Pistachio Nut (Diagnostic) Hives, Shortness Of Breath, Itching, Swelling and Rash  . Latex Dermatitis  . Tape Other (See Comments)    Tears and bruises the skin  . Betadine [Povidone Iodine] Rash and Other (See Comments)    Raised rash 04/23/14  . Lidocaine Other (See Comments)    Minimal effect with lidocaine, prefers different anesthetic  . Penicillins Rash and Other (See Comments)    Rash all over as a child    Family History  Problem Relation Age of Onset  . Stroke Mother        multiple  . Leukemia Mother   . Pneumonia Mother   . Cancer Mother   . Diabetes Mother   . Diabetes Father   . Heart disease Father   . Hyperlipidemia Father   . Hypertension Father   . Allergic rhinitis Neg Hx   . Angioedema Neg Hx   . Asthma Neg Hx   . Eczema Neg Hx   . Immunodeficiency Neg Hx   . Urticaria Neg Hx     Pulse 94   Wt (!) 307 lb (139.3 kg)   SpO2 96%   BMI 46.68 kg/m    Review of Systems He continues to lose weight.      Objective:   Physical Exam VITAL SIGNS:  See vs  page GENERAL: no distress Pulses: dorsalis pedis intact bilat.   MSK: no deformity of the feet CV: 2+ bilat leg edema, and bilat vv's.  Skin: healed ulcers on the feet and legs.  normal color and temp on the feet, but there is (chronic) erythema of the feet.  Neuro: sensation is intact to touch on the feet, but severely decreased from normal.    Lab Results  Component Value Date   CREATININE 1.08 02/15/2020   BUN 15 02/15/2020   NA 137 02/15/2020   K 3.9 02/15/2020   CL 102 02/15/2020   CO2 26 02/15/2020    A1c=6.7%    Assessment & Plan:  Type 2 DM, with PN CRI: reduce glipizide  Patient Instructions  I have sent a prescription to your pharmacy, to reduce the glipizide to 1 pill each morning.  Please continue the same other diabetes medications.  check your blood sugar once a day.  vary the time of day when you check, between before the 3 meals, and at bedtime.  also check if you have symptoms of your blood sugar being too high or too low.  please keep a record of the readings and bring it to your next appointment here (or you can bring the meter itself).  You can write it on any piece of paper.  please call us sooner if your blood sugar goes below 70, or if you have a lot of readings over 200.  Please come back for a follow-up appointment in 3 months.

## 2020-04-13 ENCOUNTER — Ambulatory Visit: Payer: Managed Care, Other (non HMO) | Admitting: Speech Pathology

## 2020-04-14 ENCOUNTER — Ambulatory Visit: Payer: Managed Care, Other (non HMO) | Admitting: Physical Therapy

## 2020-04-14 ENCOUNTER — Other Ambulatory Visit: Payer: Self-pay

## 2020-04-14 ENCOUNTER — Ambulatory Visit: Payer: Managed Care, Other (non HMO) | Admitting: Occupational Therapy

## 2020-04-14 DIAGNOSIS — R278 Other lack of coordination: Secondary | ICD-10-CM

## 2020-04-14 DIAGNOSIS — R2681 Unsteadiness on feet: Secondary | ICD-10-CM | POA: Diagnosis not present

## 2020-04-14 DIAGNOSIS — M6281 Muscle weakness (generalized): Secondary | ICD-10-CM

## 2020-04-14 DIAGNOSIS — R2689 Other abnormalities of gait and mobility: Secondary | ICD-10-CM

## 2020-04-14 NOTE — Therapy (Signed)
[facility] [address] [place], [place], [address] Phone: [phone]   Fax:  [phone]  Physical Therapy Treatment  Patient Details  Name: [name] [name] MRN: [id number] Date of Birth: 1960/10/04 Referring Provider (PT): Dr. Dallas Schimke   Encounter Date: 04/14/2020   PT End of Session - 04/14/20 1337    Visit Number 7    Number of Visits 9    Date for PT Re-Evaluation 04/21/20    Authorization Type Medicare    Progress Note Due on Visit 9    PT Start Time 1145    PT Stop Time 1230    PT Time Calculation (min) 45 min    Equipment Utilized During Treatment Gait belt    Activity Tolerance Patient tolerated treatment well    Behavior During Therapy [facility] for tasks assessed/performed           Past Medical History:  Diagnosis Date  . Angio-edema   . Anxiety   . Depression   . Diabetes mellitus type II    type 2  . GERD (gastroesophageal reflux disease)   . Headache    migraine  . History of kidney stones   . HLD (hyperlipidemia)   . HTN (hypertension)   . Morbid obesity (HCC)   . Restless legs syndrome (RLS) 11/20/2012  . Stroke (HCC) 02/14/2020   ischemic/right sided affected  . Tobacco abuse   . Ulcer of left ankle (HCC)    last 2 months dime size dry dressing changing q day  . Urticaria     Past Surgical History:  Procedure Laterality Date  . Arm surgery Right 1969   Abscess excision and debridement at elbow  . COLONOSCOPY WITH PROPOFOL N/A 09/06/2016   Procedure: COLONOSCOPY WITH PROPOFOL;  Surgeon: Sherrilyn Rist, MD;  Location: [facility];  Service: Gastroenterology;  Laterality: N/A;  . I & D EXTREMITY Left 05/29/2014   Procedure: IRRIGATION AND DEBRIDEMENT OF LEFT LEG WOUND AND PLACEMENT OF  INTEGRA  AND  VAC;  Surgeon: Wayland Denis, DO;  Location: [facility];  Service: Plastics;  Laterality: Left;  . implantable loop recorder implant  03/06/2020   Medtronic Reveal [place]  model LNQ11 (SN  [id number]) implanted by Dr Johney Frame for cryptogenic stroke  . INCISION AND DRAINAGE OF WOUND Left 04/23/2014   Procedure: IRRIGATION AND DEBRIDEMENT LOWER LEFT LEG WOUND WITH PLACEMENT OF INTEGRA AND VAC;  Surgeon: Wayland Denis, DO;  Location: [facility];  Service: Plastics;  Laterality: Left;  . kidney stone removal  2004  . MINOR APPLICATION OF WOUND VAC Left 04/23/2014   Procedure: MINOR APPLICATION OF WOUND VAC;  Surgeon: Wayland Denis, DO;  Location: [facility];  Service: Plastics;  Laterality: Left;  . SKIN SPLIT GRAFT Left 06/18/2014   Procedure: SKIN GRAFT SPLIT THICKNESS TO LOWER LEFT LEG WOUND WITH PLACEMENT OF VAC;  Surgeon: Wayland Denis, DO;  [facility];  Service: Plastics;  Laterality: Left;  Marland Kitchen VASECTOMY  1994    There were no vitals filed for this visit.   Subjective Assessment - 04/14/20 1149    Subjective Pt states that he's been feeling anxious. Pt reports on his last A1C check his glucose has improved to a 6. He reports some concern about his upper body posture and is asking about strengthening for this.    Pertinent History hypertension, hyperlipidemia, type 2 diabetes mellitus, restless leg syndrome, diabetic neuropathy, morbid obesity, tobacco abuse, depression/anxiety, GERD, chronic  venous stasis ulcer in bilateral lower extremities    Limitations Reading;Walking;House hold activities    How long can you sit comfortably? no issues    How long can you stand comfortably? 10 min    How long can you walk comfortably? 5 min    Diagnostic tests CT head showed Hyperdense right MCA.  CTA head and neck and perfusion study showed 10 cc region of completed infarction in left occipital cortex and additional 11 cc at risk/penumbra. [name] (wife) reports that short term memory is affected.    Patient Stated Goals improve walking    Currently in Pain? No/denies                             [facility]  Adult PT Treatment/Exercise - 04/14/20 0001      Ambulation/Gait   Ambulation/Gait Yes    Ambulation/Gait Assistance 5: Supervision    Ambulation/Gait Assistance Details 7 min 48 sec; 3/10 no rest breaks    Ambulation Distance (Feet) 805 Feet    Assistive device Straight cane    Gait Pattern Step-through pattern               Balance Exercises - 04/14/20 0001      Balance Exercises: Standing   Standing Eyes Closed Narrow base of support (BOS);Foam/compliant surface;30 secs;Limitations;2 reps;Head turns   2x30 sec head nods & head turns   Other Standing Exercises Forward stepping on ramp with compliant surface 4x6'; side ways stepping on ramp with compliant surface x4' bilat    Other Standing Exercises Comments Attempted standing on inclined ramp on compliant surface; however, pt with difficulty tolerating due to his prior ankle surgery             PT Education - 04/14/20 1338    Education Details Educated pt on his PT POC. Addressed pt's concerns about upper body strengthening. Discussed with pt thinking about any more goals that he wants to accomplish with therapy for next week.    Person(s) Educated Patient    Methods Explanation;Demonstration    Comprehension Verbalized understanding;Returned demonstration            PT Short Term Goals - 03/25/20 1131      PT SHORT TERM GOAL #1   Title patient will be able to walk with or without AD for 10 min to improve walking endurance    Baseline <5 min without AD, 12 minutes with SPC    Time 4    Period Weeks    Status Achieved    Target Date 03/24/20             PT Long Term Goals - 02/25/20 1933      PT LONG TERM GOAL #1   Title Pt will be able to ambulate for 30 min with or without AD to improve walking endurance    Baseline <5 min without AD    Time 8    Period Weeks    Status New    Target Date 04/21/20      PT LONG TERM GOAL #2   Title Pt will demo 12 sec or less with 5x/ sit to stand without HHA to  improve functional strength    Baseline 15 sec without HHA    Time 8    Period Weeks    Status New    Target Date 04/21/20  Plan - 04/14/20 1216    Clinical Impression Statement Treatment focused on increasing pt's endurance on paved surfaces outside (he didn't feel comfortable attempting grass like last session). Pt comes into clinic not feeling 100% due to increased anxiety; discussed his POC and concerns at length. Despite this, he was able to amb >735' with no standing rest breaks which is improved from last session. Treatmented continued to progress pt's balance on compliant surfaces. Pt is progressing towards goals. Pt states he has been ambulating 10-15 min a day and has not yet progressed to 30 min. Pt's balance continues to be challenged on compliant surfaces with vision removed.    Personal Factors and Comorbidities Comorbidity 3+;Time since onset of injury/illness/exacerbation    Comorbidities hypertension, hyperlipidemia, type 2 diabetes mellitus, restless leg syndrome, diabetic neuropathy, morbid obesity, tobacco abuse, depression/anxiety, GERD, chronic venous stasis ulcer in bilateral lower extremities    Examination-Activity Limitations Squat;Stand;Stairs    Examination-Participation Restrictions Community Activity;Yard Work;Shop    Rehab Potential Good    PT Frequency 1x / week    PT Duration 8 weeks    PT Treatment/Interventions ADLs/Self Care Home Management;Gait training;Stair training;Functional mobility training;Therapeutic activities;Therapeutic exercise;Balance training;Neuromuscular re-education;Patient/family education;Manual techniques;Passive range of motion;Energy conservation;Joint Manipulations    PT Next Visit Plan Walking outside for endurance. Continue exercises for BLE strengthening. Continue exercises to promote Balance Exercises (compliant surfaces). Consider ambulating on compliant surfaces and different levels.    PT Home Exercise Plan  Walking program: walking with family member for 5 min and gradually progressing it to 30 min    Consulted and Agree with Plan of Care Patient           Patient will benefit from skilled therapeutic intervention in order to improve the following deficits and impairments:  Abnormal gait, Decreased activity tolerance, Decreased mobility, Decreased endurance, Difficulty walking, Impaired sensation, Impaired vision/preception, Postural dysfunction  Visit Diagnosis: Other lack of coordination  Unsteadiness on feet  Muscle weakness (generalized)  Other abnormalities of gait and mobility     Problem List Patient Active Problem List   Diagnosis Date Noted  . Aortic calcification (HCC) 02/22/2020  . Carotid artery calcification, bilateral 02/22/2020  . Depression   . GERD (gastroesophageal reflux disease)   . Ischemic stroke (HCC), occipital   . Diabetic polyneuropathy associated with type 2 diabetes mellitus (HCC) 06/30/2018  . Other chronic pain 06/30/2018  . Erectile dysfunction due to type 2 diabetes mellitus (HCC) 06/30/2018  . Circulation disorder of lower extremity 05/31/2015  . IBS (irritable bowel syndrome) 12/24/2014  . 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 Diabetic leg ulcer (HCC) 07/18/2013  . Restless legs syndrome (RLS) 11/20/2012  . Generalized anxiety disorder   . Type 2 diabetes mellitus with vascular disease (HCC)   . Hyperlipidemia LDL goal <70 01/16/2009  . TOBACCO ABUSE 07/27/2007  . Morbid obesity (HCC) 09/29/2006  . Essential hypertension 09/29/2006  . CALCULUS, KIDNEY 05/30/2002    [facility] [address] PT, DPT 04/14/2020, 6:32 PM  [facility] [address] [place], [place], [address] Phone: [phone]   Fax:  [phone]  Name: Piercen Covino MRN: [id number] Date of Birth: 1961/02/05

## 2020-04-14 NOTE — Patient Instructions (Signed)
° ° °  Strengthening: Resisted Flexion   Hold tubing with __Rt___ arm(s) at side. Pull forward and up. Move shoulder through pain-free range of motion. Repeat __10__ times per set.  Do _1-2_ sessions per day , every other day   Strengthening: Resisted Extension   Hold tubing in __Rt___ hand(s), arm forward. Pull arm back, elbow straight. Repeat _10___ times per set. Do _1-2___ sessions per day, every other day.  Shoulder External Rotators    With left elbow bent 90 and held at side (towel roll held at side), move hand away from body, keeping elbow bent at side. Use tubing  Keep head and back straight. Hold __3__ seconds. Repeat _10___ times. Do _1-2___ sessions per day, every other day. CAUTION: Move slowly.  Internal Rotation: Single Arm (Cable)    Forearm away from body, rotate arm toward torso, keeping upper arm against body. Do __1-2_ sets. Complete __10__ repetitions, every other day.

## 2020-04-14 NOTE — Therapy (Signed)
[facility] [address] [place], [place], [address] Phone: [phone]   Fax:  [phone]  Occupational Therapy Treatment  Patient Details  Name: [name] [name] MRN: [id number] Date of Birth: [birth year] Referring Provider (OT): Dr. Erlinda Hong   Encounter Date: 04/14/2020   OT End of Session - 04/14/20 1421    Visit Number 7    Number of Visits 16    Date for OT Re-Evaluation 05/17/20    Authorization Type Cigna/C[employer]a managed    Authorization Time Period 60 days    OT Start Time 1320    OT Stop Time 1400    OT Time Calculation (min) 40 min    Activity Tolerance Patient tolerated treatment well;Other (comment)   Pt became tearful with challenging cognitive activity   Behavior During Therapy WFL for tasks assessed/performed           Past Medical History:  Diagnosis Date  . Angio-edema   . Anxiety   . Depression   . Diabetes mellitus type II    type 2  . GERD (gastroesophageal reflux disease)   . Headache    migraine  . History of kidney stones   . HLD (hyperlipidemia)   . HTN (hypertension)   . Morbid obesity ([place])   . Restless legs syndrome (RLS) 11/20/2012  . Stroke ([place]) 02/14/2020   ischemic/right sided affected  . Tobacco abuse   . Ulcer of left ankle ([facility])    last 2 months dime size dry dressing changing q day  . Urticaria     Past Surgical History:  Procedure Laterality Date  . Arm surgery Right 1969   Abscess excision and debridement at elbow  . COLONOSCOPY WITH PROPOFOL N/A 09/06/2016   Procedure: COLONOSCOPY WITH PROPOFOL;  Surgeon: Doran Stabler, MD;  Location: [facility];  Service: Gastroenterology;  Laterality: N/A;  . I & D EXTREMITY Left 05/29/2014   Procedure: IRRIGATION AND DEBRIDEMENT OF LEFT LEG WOUND AND PLACEMENT OF  INTEGRA  AND  VAC;  Surgeon: Theodoro Kos, DO;  Location: [facility];  Service: Plastics;  Laterality: Left;  . implantable loop recorder implant   03/06/2020   Medtronic Reveal [place] model LNQ11 (SN  [id number]) implanted by Dr Rayann Heman for cryptogenic stroke  . INCISION AND DRAINAGE OF WOUND Left 04/23/2014   Procedure: IRRIGATION AND DEBRIDEMENT LOWER LEFT LEG WOUND WITH PLACEMENT OF INTEGRA AND VAC;  Surgeon: Theodoro Kos, DO;  Location: [facility];  Service: Plastics;  Laterality: Left;  . kidney stone removal  2004  . MINOR APPLICATION OF WOUND VAC Left 04/23/2014   Procedure: MINOR APPLICATION OF WOUND VAC;  Surgeon: Theodoro Kos, DO;  Location: [facility];  Service: Plastics;  Laterality: Left;  . SKIN SPLIT GRAFT Left 06/18/2014   Procedure: SKIN GRAFT SPLIT THICKNESS TO LOWER LEFT LEG WOUND WITH PLACEMENT OF VAC;  Surgeon: Theodoro Kos, DO;  Location: [facility];  Service: Plastics;  Laterality: Left;  Marland Kitchen VASECTOMY  1994    There were no vitals filed for this visit.   Subjective Assessment - 04/14/20 1322    Subjective  April worked me hard today    Pertinent History CVA 02/14/20 Lt occipital lobe. PMH: 2 small previous strokes, DM, GERD, HLD, HTN (but now has issues w/ orthostasis)    Limitations no driving, loop recorder    Currently in Pain? No/denies    Pain Onset More than a month ago  Reviewed memory strategies and how to incorporate specifically for medication and financial management.  9 hole peg test Rt = 31.65 sec. Pt encouraged to continue higher level coordination ex's and practiced manipulating coins in hand. Issued theraband HEP - see pt instructions for details. Modifications for ER due to pain but pt reports pain subsided w/ modifications                      OT Education - 04/14/20 1349    Education Details theraband HEP    Person(s) Educated Patient    Methods Explanation;Demonstration;Handout    Comprehension Verbalized understanding;Returned demonstration            OT Short Term Goals - 04/14/20 1422      OT SHORT TERM GOAL  #1   Title Pt will be independent with HEP for Rt hand coordination and Rt shoulder (low range strengthening)    Time 4    Period Weeks    Status Achieved   MET for coordination     OT SHORT TERM GOAL #2   Title Pt will be independent with visual scanning strategies to compensate for Rt visual field cut    Time 4    Period Weeks    Status Achieved      OT SHORT TERM GOAL #3   Title Improve coordination Rt hand to 28 sec or less for high level tasks    Baseline 33.91 sec    Time 4    Period Weeks    Status On-going   31.65 sec     OT SHORT TERM GOAL #4   Title Pt will verbalize understanding with memory compensatory strategies and utilize for medication and financial management    Time 4    Period Weeks    Status Achieved      OT SHORT TERM GOAL #5   Title Pt will perform simple financial management tasks using calculator prn with supervision and no more than min v.c's    Time 4    Period Weeks      OT SHORT TERM GOAL #6   Title Pt will perform simple stovetop task with supervision only    Time 4    Period Weeks    Status New      OT SHORT TERM GOAL #7   Title Pt will perform tabletop scanning with 100% accuracy and environmental scanning with 75% or greater accuracy    Time 4    Period Weeks    Status Partially Met   met w/ environmental scanning (92%), but inconsistent with tabletop scanning            OT Long Term Goals - 03/17/20 1449      OT LONG TERM GOAL #1   Title Pt to return to cooking with distant supervision    Time 8    Period Weeks    Status New      OT LONG TERM GOAL #2   Title Pt will complete meal plan for the week and develop corresponding grocery list I'ly    Time 8    Period Weeks    Status New      OT LONG TERM GOAL #3   Title Pt will perform full financial management tasks at 100% accuracy with no cues    Time 8    Period Weeks    Status New      OT LONG TERM GOAL #4   Title Pt will demo  organizational and problem solving skills  to complete and adapt a schedule prn    Time 8    Period Weeks    Status New      OT LONG TERM GOAL #5   Title Pt will perform environmental scanning with 90% or greater accuracy    Time 8    Period Weeks    Status New                 Plan - 04/14/20 1423    Clinical Impression Statement Pt has met STG's #1, 3, and 4. Pt progressing towards remaining goals    OT Occupational Profile and History Detailed Assessment- Review of Records and additional review of physical, cognitive, psychosocial history related to current functional performance    Occupational performance deficits (Please refer to evaluation for details): ADL's;IADL's;Work;Leisure    Body Structure / Function / Physical Skills ADL;Strength;Balance;UE functional use;IADL;Vision;Coordination;Mobility;Sensation;FMC    Cognitive Skills Memory;Perception;Problem Solve;Sequencing    Rehab Potential Good    Clinical Decision Making Several treatment options, min-mod task modification necessary    Comorbidities Affecting Occupational Performance: May have comorbidities impacting occupational performance    Modification or Assistance to Complete Evaluation  Min-Moderate modification of tasks or assist with assess necessary to complete eval    OT Frequency 2x / week    OT Duration 8 weeks   plus eval   OT Treatment/Interventions Self-care/ADL training;DME and/or AE instruction;Therapeutic activities;Therapeutic exercise;Cognitive remediation/compensation;Coping strategies training;Functional Mobility Training;Neuromuscular education;Visual/perceptual remediation/compensation;Patient/family education    Plan Progress towards remaining goals    Consulted and Agree with Plan of Care Patient;Family member/caregiver    Family Member Consulted wife           Patient will benefit from skilled therapeutic intervention in order to improve the following deficits and impairments:   Body Structure / Function / Physical Skills: ADL,  Strength, Balance, UE functional use, IADL, Vision, Coordination, Mobility, Sensation, [facility] Cognitive Skills: Memory, Perception, Problem Solve, Sequencing     Visit Diagnosis: Muscle weakness (generalized)  Other lack of coordination    Problem List Patient Active Problem List   Diagnosis Date Noted  . Aortic calcification ([facility]) 02/22/2020  . Carotid artery calcification, bilateral 02/22/2020  . Depression   . GERD (gastroesophageal reflux disease)   . Ischemic stroke ([facility]), occipital   . Diabetic polyneuropathy associated with type 2 diabetes mellitus ([facility]) 06/30/2018  . Other chronic pain 06/30/2018  . Erectile dysfunction due to type 2 diabetes mellitus ([facility]) 06/30/2018  . Circulation disorder of lower extremity 05/31/2015  . IBS (irritable bowel syndrome) 12/24/2014  . 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 Diabetic leg ulcer ([facility]) 07/18/2013  . Restless legs syndrome (RLS) 11/20/2012  . Generalized anxiety disorder   . Type 2 diabetes mellitus with vascular disease ([place])   . Hyperlipidemia LDL goal <70 01/16/2009  . TOBACCO ABUSE 07/27/2007  . Morbid obesity ([place]) 09/29/2006  . Essential hypertension 09/29/2006  . CALCULUS, KIDNEY 05/30/2002    Carey Bullocks, OTR/L 04/14/2020, 2:29 PM  [facility] [address] [place], [place], [address] Phone: [phone]   Fax:  [phone]  Name: Tadeusz Stahl MRN: [id number] Date of Birth: 06-26-1960

## 2020-04-15 ENCOUNTER — Encounter: Payer: Self-pay | Admitting: Speech Pathology

## 2020-04-15 ENCOUNTER — Ambulatory Visit: Payer: Managed Care, Other (non HMO) | Admitting: Occupational Therapy

## 2020-04-15 ENCOUNTER — Ambulatory Visit: Payer: Managed Care, Other (non HMO) | Admitting: Speech Pathology

## 2020-04-15 ENCOUNTER — Ambulatory Visit: Payer: Managed Care, Other (non HMO) | Admitting: Physical Therapy

## 2020-04-15 DIAGNOSIS — R4701 Aphasia: Secondary | ICD-10-CM

## 2020-04-15 DIAGNOSIS — R278 Other lack of coordination: Secondary | ICD-10-CM

## 2020-04-15 DIAGNOSIS — R2681 Unsteadiness on feet: Secondary | ICD-10-CM | POA: Diagnosis not present

## 2020-04-15 DIAGNOSIS — R41842 Visuospatial deficit: Secondary | ICD-10-CM

## 2020-04-15 DIAGNOSIS — R41841 Cognitive communication deficit: Secondary | ICD-10-CM

## 2020-04-15 DIAGNOSIS — M6281 Muscle weakness (generalized): Secondary | ICD-10-CM

## 2020-04-15 NOTE — Therapy (Signed)
[facility] [address] [place], , [address] Phone: [phone]   Fax:  [phone]  Speech Language Pathology Treatment  Patient Details  Name: [name] [name] MRN: [id number] Date of Birth: 07/29/1960 Referring Provider (SLP): Gokul Krishnan (referring); Copland, Spencer (PCP/documentation)   Encounter Date: 04/15/2020   End of Session - 04/15/20 1159    Visit Number 11    Number of Visits 17    Date for SLP Re-Evaluation 05/02/20    SLP Start Time 1103    SLP Stop Time  1146    SLP Time Calculation (min) 43 min    Activity Tolerance Patient tolerated treatment well           Past Medical History:  Diagnosis Date  . Angio-edema   . Anxiety   . Depression   . Diabetes mellitus type II    type 2  . GERD (gastroesophageal reflux disease)   . Headache    migraine  . History of kidney stones   . HLD (hyperlipidemia)   . HTN (hypertension)   . Morbid obesity ([facility])   . Restless legs syndrome (RLS) 11/20/2012  . Stroke ([facility]) 02/14/2020   ischemic/right sided affected  . Tobacco abuse   . Ulcer of left ankle ([facility])    last 2 months dime size dry dressing changing q day  . Urticaria     Past Surgical History:  Procedure Laterality Date  . Arm surgery Right 1969   Abscess excision and debridement at elbow  . COLONOSCOPY WITH PROPOFOL N/A 09/06/2016   Procedure: COLONOSCOPY WITH PROPOFOL;  Surgeon: Henry L Danis III, MD;  Location: [facility];  Service: Gastroenterology;  Laterality: N/A;  . I & D EXTREMITY Left 05/29/2014   Procedure: IRRIGATION AND DEBRIDEMENT OF LEFT LEG WOUND AND PLACEMENT OF  INTEGRA  AND  VAC;  Surgeon: Claire Sanger, DO;  Location: [facility];  Service: Plastics;  Laterality: Left;  . implantable loop recorder implant  03/06/2020   Medtronic Reveal [place] model LNQ11 (SN  [id number]) implanted by Dr Allred for cryptogenic stroke  . INCISION AND DRAINAGE OF WOUND  Left 04/23/2014   Procedure: IRRIGATION AND DEBRIDEMENT LOWER LEFT LEG WOUND WITH PLACEMENT OF INTEGRA AND VAC;  Surgeon: Claire Sanger, DO;  Location: [facility];  Service: Plastics;  Laterality: Left;  . kidney stone removal  2004  . MINOR APPLICATION OF WOUND VAC Left 04/23/2014   Procedure: MINOR APPLICATION OF WOUND VAC;  Surgeon: Claire Sanger, DO;  Location: [facility];  Service: Plastics;  Laterality: Left;  . SKIN SPLIT GRAFT Left 06/18/2014   Procedure: SKIN GRAFT SPLIT THICKNESS TO LOWER LEFT LEG WOUND WITH PLACEMENT OF VAC;  Surgeon: Claire Sanger, DO;  Location: [facility];  Service: Plastics;  Laterality: Left;  . VASECTOMY  1994    There were no vitals filed for this visit.          ADULT SLP TREATMENT - 04/15/20 1150      General Information   Behavior/Cognition Alert;Cooperative;Pleasant mood      Treatment Provided   Treatment provided Cognitive-Linquistic      Cognitive-Linquistic Treatment   Treatment focused on Cognition;Patient/family/caregiver education    Skilled Treatment [name] [name] voicing concerns re: slow processing, reduced reading comprehension and vision affecting his ability to return to work. Verbalized awareness that he can not perform to the level and speed that is required for his job. Targeted strategies for comprhending, processing and   recalling verbal information. [name] [name] generated 3 phrases he can say to self advocate, let others know he needs more time to process and respond (See pt instructions). We also generated enviornmental chages he can make to improve comprehension and processing as well as strategies his family can implement to help [name] [name] communicate. [name] [name] reports he is using accessibilty option of text to speech with success at home.       Assessment / Recommendations / Plan   Plan Continue with current plan of care            SLP Education - 04/15/20 1155    Education Details self advocate,  educate others that you need more time to process what they are saying, strategies to auditory comprehension, processing and recall.    Person(s) Educated Patient    Methods Explanation;Verbal cues;Handout    Comprehension Verbalized understanding;Verbal cues required;Need further instruction            SLP Short Term Goals - 04/15/20 1158      SLP SHORT TERM GOAL #1   Title Patient will participate functionally in 8 minutes simple-mod complex conversation using compensations for anomia as necessary, x3 visits.    Baseline 03/31/20 04/02/20    Time 1    Period Weeks    Status Partially Met      SLP SHORT TERM GOAL #2   Title Pt will demonstrate auditory comprehension of 5 minutes simple-mod complex material by answering [place] questions >90% accuracy x 2 sessions.    Baseline 04/02/20    Time 1    Period Weeks    Status Partially Met      SLP SHORT TERM GOAL #3   Title Pt will participate in assessment of reading, writing and cognition with goals added PRN    Baseline writing assessed 03/12/20; allowing more time for assessment due to anxiety    Time 1    Period Weeks    Status Partially Met            SLP Long Term Goals - 04/15/20 1158      SLP LONG TERM GOAL #1   Title Patient will participate functionally in 15 minutes mod complex-complex conversation using compensations as necessary x 3 visits.    Time 3    Period Weeks    Status On-going      SLP LONG TERM GOAL #2   Title Patient will demonstrate auditory comprehension of 15 minutes mod complex content (conversation, podcast, radio program, etc) x 3 visits by answering detailed questions >90% accuracy.    Time 3    Period Weeks    Status On-going      SLP LONG TERM GOAL #3   Title Patient will report using accessibility features to read and reply to text messages x 3 visits.    Time 3    Period Weeks    Status Achieved      SLP LONG TERM GOAL #4   Title Pt will use compensations for processing mod complex  written/verbal information with mod I x 2 sessions.    Time 4    Period Weeks    Status On-going      SLP LONG TERM GOAL #5   Title Pt will complete mod complex functional problem solving activities >90% accuracy with extra time.    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 Error awareness/correction is a relative strength, however [name] [name] has slow processing, impaired problem solving, memory, and visuospatial skills.  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 [name] [name] remains motivated for progress, and works diligently outside of therapy on tasks assigned in session and therapy apps on his tablet. I recommend skilled ST to maximize communication and cognitive abilities to reduce frustration, improve language abilities, safety, and for possible return to work.    Speech Therapy Frequency 2x / week    Duration 8 weeks   17 visits   Treatment/Interventions Language facilitation;Environmental controls;Cueing hierarchy;SLP instruction and feedback;Cognitive reorganization;Compensatory techniques;Functional tasks;Compensatory strategies;Internal/external aids;Multimodal communcation approach;Patient/family education    Potential to Achieve Goals Good           Patient will benefit from skilled therapeutic intervention in order to improve the following deficits and impairments:   Aphasia  Cognitive communication deficit    Problem List Patient Active Problem List   Diagnosis Date Noted  . Aortic calcification ([place] [facility]) 02/22/2020  . Carotid artery calcification, bilateral 02/22/2020  . Depression   . GERD (gastroesophageal reflux disease)   . Ischemic stroke ([place]), occipital   . Diabetic  polyneuropathy associated with type 2 diabetes mellitus ([facility]) 06/30/2018  . Other chronic pain 06/30/2018  . Erectile dysfunction due to type 2 diabetes mellitus ([place]) 06/30/2018  . Circulation disorder of lower extremity 05/31/2015  . IBS (irritable bowel syndrome) 12/24/2014  . 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 Diabetic leg ulcer ([facility]) 07/18/2013  . Restless legs syndrome (RLS) 11/20/2012  . Generalized anxiety disorder   . Type 2 diabetes mellitus with vascular disease ([place])   . Hyperlipidemia LDL goal <70 01/16/2009  . TOBACCO ABUSE 07/27/2007  . Morbid obesity ([place]) 09/29/2006  . Essential hypertension 09/29/2006  . CALCULUS, KIDNEY 05/30/2002    Mayline Dragon, Annye Rusk MS, CCC-SLP 04/15/2020, 12:00 PM  [facility] [address] [place], [place], [address] Phone: [phone]   Fax:  [phone]   Name: [name] [name] MRN: [id number] Date of Birth: [birth year]

## 2020-04-15 NOTE — Patient Instructions (Signed)
Cranial Flexion: Overhead Arm Extension - Supine ([place])    Lie with knees bent or head/trunk elevated, arms beyond head, holding paper towel roll. Pull ball up to above face. Repeat __10__ times per set. Do __2__ sets per day.  Can also do sitting to eye level only

## 2020-04-15 NOTE — Therapy (Signed)
[facility] [address] [place], [place], [address] Phone: [phone]   Fax:  [phone]  Occupational Therapy Treatment  Patient Details  Name: [name] [name] MRN: [id number] Date of Birth: [birth year] Referring Provider (OT): Dr. Erlinda Hong   Encounter Date: 04/15/2020   OT End of Session - 04/15/20 1057    Visit Number 8    Number of Visits 16    Date for OT Re-Evaluation 05/17/20    Authorization Type Cigna/Cigna managed    Authorization Time Period 60 days    OT Start Time 1015    OT Stop Time 1100    OT Time Calculation (min) 45 min    Activity Tolerance Patient tolerated treatment well;Other (comment)   Pt became tearful with challenging cognitive activity   Behavior During Therapy WFL for tasks assessed/performed           Past Medical History:  Diagnosis Date  . Angio-edema   . Anxiety   . Depression   . Diabetes mellitus type II    type 2  . GERD (gastroesophageal reflux disease)   . Headache    migraine  . History of kidney stones   . HLD (hyperlipidemia)   . HTN (hypertension)   . Morbid obesity ([facility])   . Restless legs syndrome (RLS) 11/20/2012  . Stroke ([facility]) 02/14/2020   ischemic/right sided affected  . Tobacco abuse   . Ulcer of left ankle (HCC)    last 2 months dime size dry dressing changing q day  . Urticaria     Past Surgical History:  Procedure Laterality Date  . Arm surgery Right 1969   Abscess excision and debridement at elbow  . COLONOSCOPY WITH PROPOFOL N/A 09/06/2016   Procedure: COLONOSCOPY WITH PROPOFOL;  Surgeon: Doran Stabler, MD;  [facility]: [facility];  Service: Gastroenterology;  Laterality: N/A;  . I & D EXTREMITY Left 05/29/2014   Procedure: IRRIGATION AND DEBRIDEMENT OF LEFT LEG WOUND AND PLACEMENT OF  INTEGRA  AND  VAC;  Surgeon: Theodoro Kos, DO;  Location: [facility];  Service: Plastics;  Laterality: Left;  . implantable loop recorder implant   03/06/2020   Medtronic Reveal [place] model LNQ11 (SN  [id number]) implanted by Dr Rayann Heman for cryptogenic stroke  . INCISION AND DRAINAGE OF WOUND Left 04/23/2014   Procedure: IRRIGATION AND DEBRIDEMENT LOWER LEFT LEG WOUND WITH PLACEMENT OF INTEGRA AND VAC;  Surgeon: Theodoro Kos, DO;  Location: [facility];  Service: Plastics;  Laterality: Left;  . kidney stone removal  2004  . MINOR APPLICATION OF WOUND VAC Left 04/23/2014   Procedure: MINOR APPLICATION OF WOUND VAC;  Surgeon: Theodoro Kos, DO;  Location: [facility];  Service: Plastics;  Laterality: Left;  . SKIN SPLIT GRAFT Left 06/18/2014   Procedure: SKIN GRAFT SPLIT THICKNESS TO LOWER LEFT LEG WOUND WITH PLACEMENT OF VAC;  Surgeon: Theodoro Kos, DO;  Location: [facility];  Service: Plastics;  Laterality: Left;  Marland Kitchen VASECTOMY  1994    There were no vitals filed for this visit.   Subjective Assessment - 04/15/20 1016    Subjective  You are first today    Pertinent History CVA 02/14/20 Lt occipital lobe. PMH: 2 small previous strokes, DM, GERD, HLD, HTN (but now has issues w/ orthostasis)    Limitations no driving, loop recorder    Currently in Pain? No/denies    Pain Onset More than a month ago  Reviewed proper positioning for theraband HEP. Pt also issued BUE sh flexion ex in supine for high level sh flexion, pt can also do seated to eye level. Pt practiced each.  Copying small peg design for visual scanning and coordination Rt hand with 100% accuracy copying design, only min difficulty using Rt hand.  Deduction puzzle - 1st one with mod cueing to get started, 2nd puzzle with only 1 cue and extra time required d/t processing and aphasia.                      OT Education - 04/15/20 1057    Education Details shoulder ex supine    Person(s) Educated Patient    Methods Explanation;Demonstration;Handout    Comprehension Verbalized understanding;Returned  demonstration            OT Short Term Goals - 04/15/20 1025      OT SHORT TERM GOAL #1   Title Pt will be independent with HEP for Rt hand coordination and Rt shoulder (low range strengthening)    Time 4    Period Weeks    Status Achieved   MET for coordination     OT SHORT TERM GOAL #2   Title Pt will be independent with visual scanning strategies to compensate for Rt visual field cut    Time 4    Period Weeks    Status Achieved      OT SHORT TERM GOAL #3   Title Improve coordination Rt hand to 28 sec or less for high level tasks    Baseline 33.91 sec    Time 4    Period Weeks    Status On-going   31.65 sec     OT SHORT TERM GOAL #4   Title Pt will verbalize understanding with memory compensatory strategies and utilize for medication and financial management    Time 4    Period Weeks    Status Achieved      OT SHORT TERM GOAL #5   Title Pt will perform simple financial management tasks using calculator prn with supervision and no more than min v.c's    Time 4    Period Weeks    Status Achieved   min cues to copy numbers correctly from calculator to paper     OT SHORT TERM GOAL #6   Title Pt will perform simple stovetop task with supervision only    Time 4    Period Weeks    Status Partially Met   per pt report (assist only to take something off stove or out of oven for safety d/t vision, supervision otherwise)     OT SHORT TERM GOAL #7   Title Pt will perform tabletop scanning with 100% accuracy and environmental scanning with 75% or greater accuracy    Time 4    Period Weeks    Status Partially Met   met w/ environmental scanning (92%), but inconsistent with tabletop scanning            OT Long Term Goals - 03/17/20 1449      OT LONG TERM GOAL #1   Title Pt to return to cooking with distant supervision    Time 8    Period Weeks    Status New      OT LONG TERM GOAL #2   Title Pt will complete meal plan for the week and develop corresponding grocery  list I'ly    Time 8    Period Weeks  Status New      OT LONG TERM GOAL #3   Title Pt will perform full financial management tasks at 100% accuracy with no cues    Time 8    Period Weeks    Status New      OT LONG TERM GOAL #4   Title Pt will demo organizational and problem solving skills to complete and adapt a schedule prn    Time 8    Period Weeks    Status New      OT LONG TERM GOAL #5   Title Pt will perform environmental scanning with 90% or greater accuracy    Time 8    Period Weeks    Status New                 Plan - 04/15/20 1059    Clinical Impression Statement Pt progressing towards goals    OT Occupational Profile and History Detailed Assessment- Review of Records and additional review of physical, cognitive, psychosocial history related to current functional performance    Occupational performance deficits (Please refer to evaluation for details): ADL's;IADL's;Work;Leisure    Body Structure / Function / Physical Skills ADL;Strength;Balance;UE functional use;IADL;Vision;Coordination;Mobility;Sensation;FMC    Cognitive Skills Memory;Perception;Problem Solve;Sequencing    Rehab Potential Good    Clinical Decision Making Several treatment options, min-mod task modification necessary    Comorbidities Affecting Occupational Performance: May have comorbidities impacting occupational performance    Modification or Assistance to Complete Evaluation  Min-Moderate modification of tasks or assist with assess necessary to complete eval    OT Frequency 2x / week    OT Duration 8 weeks   plus eval   OT Treatment/Interventions Self-care/ADL training;DME and/or AE instruction;Therapeutic activities;Therapeutic exercise;Cognitive remediation/compensation;Coping strategies training;Functional Mobility Training;Neuromuscular education;Visual/perceptual remediation/compensation;Patient/family education    Plan continue vision, coordination, and cognition    Consulted and Agree  with Plan of Care Patient;Family member/caregiver    Family Member Consulted wife           Patient will benefit from skilled therapeutic intervention in order to improve the following deficits and impairments:   Body Structure / Function / Physical Skills: ADL, Strength, Balance, UE functional use, IADL, Vision, Coordination, Mobility, Sensation, [facility] Cognitive Skills: Memory, Perception, Problem Solve, Sequencing     Visit Diagnosis: Other lack of coordination  Visuospatial deficit  Muscle weakness (generalized)    Problem List Patient Active Problem List   Diagnosis Date Noted  . Aortic calcification ([facility]) 02/22/2020  . Carotid artery calcification, bilateral 02/22/2020  . Depression   . GERD (gastroesophageal reflux disease)   . Ischemic stroke ([facility]), occipital   . Diabetic polyneuropathy associated with type 2 diabetes mellitus ([facility]) 06/30/2018  . Other chronic pain 06/30/2018  . Erectile dysfunction due to type 2 diabetes mellitus ([facility]) 06/30/2018  . Circulation disorder of lower extremity 05/31/2015  . IBS (irritable bowel syndrome) 12/24/2014  . 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 Diabetic leg ulcer ([facility]) 07/18/2013  . Restless legs syndrome (RLS) 11/20/2012  . Generalized anxiety disorder   . Type 2 diabetes mellitus with vascular disease ([facility])   . Hyperlipidemia LDL goal <70 01/16/2009  . TOBACCO ABUSE 07/27/2007  . Morbid obesity ([facility]) 09/29/2006  . Essential hypertension 09/29/2006  . CALCULUS, KIDNEY 05/30/2002    Carey Bullocks, OTR/L 04/15/2020, 12:26 PM  [facility] [address] [place], [place], [address] Phone: [phone]   Fax:  [phone]  Name: [name] [name] MRN: [id number] Date of Birth: November 18, 1960

## 2020-04-15 NOTE — Patient Instructions (Addendum)
    Get the [name]'s attention before you speak  Use eye contact and face the person you are speaking to  Be in close proximity to the person you are speaking to  Turn down any noise in the environment such as the TV, walk away from loud appliances, air conditioners, fans, dish washers etc      Give [name] time to get his words out, waiting in a relaxed, patient manner  Don't interrupt him when he is speaking  Allow him time to work through is word finding  If he uses strategies (describing, talking around it, gesturing) this is acceptable - primary goal is communicating his thought effectively, not accurate words  When anxiety increases, word finding gets harder and the words get bottled up more  If [name] raises his hand or finger, let him speak without interruption  You need to advocate for yourself when others are talking quickly (such as a doctor, nurse insurance person, financial person) "I've had a stroke, I need to you go slower'  Get information in writing if it's important  Repeat back what you have heard  Speech is rapid and people expect a response in 1 second, You may have to say, I need a little more time to answer that or "give me a minute"  "I'm not processing all of this information, can I get it in writing"  You look good - people will expect you to respond and process like normal. Let them know you've had a stroke  Invisible illness - can't see your stroke

## 2020-04-16 ENCOUNTER — Ambulatory Visit: Payer: 59 | Admitting: Neurology

## 2020-04-21 ENCOUNTER — Encounter: Payer: Self-pay | Admitting: Physical Therapy

## 2020-04-21 ENCOUNTER — Other Ambulatory Visit: Payer: Self-pay

## 2020-04-21 ENCOUNTER — Ambulatory Visit: Payer: Managed Care, Other (non HMO) | Admitting: Occupational Therapy

## 2020-04-21 ENCOUNTER — Ambulatory Visit: Payer: Managed Care, Other (non HMO) | Admitting: Physical Therapy

## 2020-04-21 ENCOUNTER — Encounter: Payer: Self-pay | Admitting: Occupational Therapy

## 2020-04-21 ENCOUNTER — Telehealth: Payer: Self-pay | Admitting: Endocrinology

## 2020-04-21 DIAGNOSIS — I69318 Other symptoms and signs involving cognitive functions following cerebral infarction: Secondary | ICD-10-CM

## 2020-04-21 DIAGNOSIS — R41842 Visuospatial deficit: Secondary | ICD-10-CM

## 2020-04-21 DIAGNOSIS — R2689 Other abnormalities of gait and mobility: Secondary | ICD-10-CM

## 2020-04-21 DIAGNOSIS — M6281 Muscle weakness (generalized): Secondary | ICD-10-CM

## 2020-04-21 DIAGNOSIS — R278 Other lack of coordination: Secondary | ICD-10-CM

## 2020-04-21 DIAGNOSIS — R2681 Unsteadiness on feet: Secondary | ICD-10-CM

## 2020-04-21 DIAGNOSIS — L97929 Non-pressure chronic ulcer of unspecified part of left lower leg with unspecified severity: Secondary | ICD-10-CM

## 2020-04-21 DIAGNOSIS — E11622 Type 2 diabetes mellitus with other skin ulcer: Secondary | ICD-10-CM

## 2020-04-21 DIAGNOSIS — H53461 Homonymous bilateral field defects, right side: Secondary | ICD-10-CM

## 2020-04-21 MED ORDER — GLIPIZIDE ER 10 MG PO TB24
10.0000 mg | ORAL_TABLET | ORAL | 3 refills | Status: DC
Start: 2020-04-21 — End: 2020-08-11

## 2020-04-21 MED ORDER — DAPAGLIFLOZIN PROPANEDIOL 10 MG PO TABS: ORAL_TABLET | ORAL | 1 refills | Status: DC

## 2020-04-21 NOTE — Telephone Encounter (Signed)
RX sent to pharmacy  

## 2020-04-21 NOTE — Therapy (Signed)
[facility] [address] [place], [place], [address] Phone: [phone]   Fax:  [phone]  Physical Therapy Treatment/Re-Cert and Progress Note  Patient Details  Name: [name] [name] MRN: [id number] Date of Birth: 1961-05-10 Referring Provider (PT): Dr. Edilia Bo  Progress Note Reporting Period 02/25/20 to 04/21/20  See note below for Objective Data and Assessment of Progress/Goals.      Encounter Date: 04/21/2020   PT End of Session - 04/21/20 1824    Visit Number 8    Number of Visits 17    Date for PT Re-Evaluation 06/16/20    Authorization Type Cigna Managed    Progress Note Due on Visit 9    PT Start Time 0277    PT Stop Time 1315    PT Time Calculation (min) 40 min    Equipment Utilized During Treatment Gait belt    Activity Tolerance Patient tolerated treatment well    Behavior During Therapy WFL for tasks assessed/performed           Past Medical History:  Diagnosis Date  . Angio-edema   . Anxiety   . Depression   . Diabetes mellitus type II    type 2  . GERD (gastroesophageal reflux disease)   . Headache    migraine  . History of kidney stones   . HLD (hyperlipidemia)   . HTN (hypertension)   . Morbid obesity ([facility])   . Restless legs syndrome (RLS) 11/20/2012  . Stroke ([facility]) 02/14/2020   ischemic/right sided affected  . Tobacco abuse   . Ulcer of left ankle (HCC)    last 2 months dime size dry dressing changing q day  . Urticaria     Past Surgical History:  Procedure Laterality Date  . Arm surgery Right 1969   Abscess excision and debridement at elbow  . COLONOSCOPY WITH PROPOFOL N/A 09/06/2016   Procedure: COLONOSCOPY WITH PROPOFOL;  Surgeon: Doran Stabler, MD;  [facility];  Service: Gastroenterology;  Laterality: N/A;  . I & D EXTREMITY Left 05/29/2014   Procedure: IRRIGATION AND DEBRIDEMENT OF LEFT LEG WOUND AND PLACEMENT OF  INTEGRA  AND  VAC;  Surgeon: Theodoro Kos, DO;  Location: [facility];  Service: Plastics;  Laterality: Left;  . implantable loop recorder implant  03/06/2020   Medtronic Reveal [place] model LNQ11 (SN  [id number]) implanted by Dr Rayann Heman for cryptogenic stroke  . INCISION AND DRAINAGE OF WOUND Left 04/23/2014   Procedure: IRRIGATION AND DEBRIDEMENT LOWER LEFT LEG WOUND WITH PLACEMENT OF INTEGRA AND VAC;  Surgeon: Theodoro Kos, DO;  Location: [facility];  Service: Plastics;  Laterality: Left;  . kidney stone removal  2004  . MINOR APPLICATION OF WOUND VAC Left 04/23/2014   Procedure: MINOR APPLICATION OF WOUND VAC;  Surgeon: Theodoro Kos, DO;  Location: [facility];  Service: Plastics;  Laterality: Left;  . SKIN SPLIT GRAFT Left 06/18/2014   Procedure: SKIN GRAFT SPLIT THICKNESS TO LOWER LEFT LEG WOUND WITH PLACEMENT OF VAC;  Surgeon: Theodoro Kos, DO;  Location: [facility];  Service: Plastics;  Laterality: Left;  Marland Kitchen VASECTOMY  1994    There were no vitals filed for this visit.   Subjective Assessment - 04/21/20 1236    Subjective Pt reports a stone bruise in his L foot over the weekend -- he is to see his PCP tomorrow for this. Wife also states that pt got his COVID vaccine booster on Friday  as well so they have been taking it easy this weekend. No falls noted. They've been able to walk 30 minutes with rest breaks (break every 10 minutes).    Pertinent History hypertension, hyperlipidemia, type 2 diabetes mellitus, restless leg syndrome, diabetic neuropathy, morbid obesity, tobacco abuse, depression/anxiety, GERD, chronic venous stasis ulcer in bilateral lower extremities    Limitations Reading;Walking;House hold activities    How long can you sit comfortably? no issues    How long can you stand comfortably? 10 min    How long can you walk comfortably? 5 min    Diagnostic tests CT head showed Hyperdense right MCA.  CTA head and neck and perfusion study showed 10 cc region  of completed infarction in left occipital cortex and additional 11 cc at risk/penumbra. [name] (wife) reports that short term memory is affected.    Patient Stated Goals improve walking    Currently in Pain? Yes    Pain Score 2    6/10 at worst with weightbearing   Pain Location Foot    Pain Orientation Left              [facility] PT Assessment - 04/21/20 0001      Ambulation/Gait   Ambulation/Gait Yes    Ambulation/Gait Assistance 5: Supervision    Ambulation/Gait Assistance Details See 6 MWT    Ambulation Distance (Feet) 530 Feet   total distance amb including 6 MWT   Assistive device Straight cane    Gait Pattern Step-through pattern;Antalgic;Decreased weight shift to left   Limited due to new L foot pain   Ambulation Surface Level;Unlevel;Indoor;Outdoor;Paved      6 Minute Walk- Baseline   6 Minute Walk- Baseline yes    Modified Borg Scale for Dyspnea 0- Nothing at all      6 Minute walk- Post Test   6 Minute Walk Post Test yes    BP (mmHg) 136/87    HR (bpm) 91    Modified Borg Scale for Dyspnea 4- somewhat severe      6 minute walk test results    Aerobic Endurance Distance Walked 481    Endurance additional comments Limited due to L foot pain; 1 standing rest break      Standardized Balance Assessment   Standardized Balance Assessment Dynamic Gait Index    Five times sit to stand comments  13.41 sec      Dynamic Gait Index   Level Surface Mild Impairment    Change in Gait Speed Mild Impairment    Gait with Horizontal Head Turns Mild Impairment    Gait with Vertical Head Turns Mild Impairment    Gait and Pivot Turn Mild Impairment    Step Over Obstacle Mild Impairment    Step Around Obstacles Normal    Steps Mild Impairment    Total Score 17                                 PT Education - 04/21/20 1830    Education Details Discussed pt's progress towards his LTGs and revising his goals for continued treatment.    Person(s) Educated  Patient;Spouse    Methods Explanation;Demonstration    Comprehension Verbalized understanding;Returned demonstration;Verbal cues required            PT Short Term Goals - 03/25/20 1131      PT SHORT TERM GOAL #1   Title patient will be able to walk with  or without AD for 10 min to improve walking endurance    Baseline <5 min without AD, 12 minutes with SPC    Time 4    Period Weeks    Status Achieved    Target Date 03/24/20             PT Long Term Goals - 04/21/20 1240      PT LONG TERM GOAL #1   Title Pt will be able to ambulate for 30 min with or without AD to improve walking endurance    Baseline <5 min without AD; able to amb 30 min with rest breaks (04/21/20)    Time 8    Period Weeks    Status Revised    Target Date 06/16/20      PT LONG TERM GOAL #2   Title Pt will demo 12 sec or less with 5x/ sit to stand without HHA to improve functional strength    Baseline 15 sec without HHA; 13.41 sec (04/21/20)    Time 8    Period Weeks    Status Revised    Target Date 06/16/20      PT LONG TERM GOAL #3   Title Pt will increase DGI score to at least 20/24 to demo decreased fall risk    Baseline 17/24 on 04/21/20    Time 8    Period Weeks    Status New    Target Date 06/16/20      PT LONG TERM GOAL #4   Title Pt and family would like pt to be able to squat at least 25# to demo improved bilat LE and trunk strength    Time 8    Period Weeks    Status New    Target Date 06/16/20      PT LONG TERM GOAL #5   Title Pt would like to be able to ambulate 6 min at least 581 ft without a/d to demo improved endurance and community mobility    Baseline 481' on 6 MWT with use of SPC (04/21/20)    Time 8    Period Weeks    Status New    Target Date 06/16/20                 Plan - 04/21/20 1831    Clinical Impression Statement Treatment focused on reassessing pt's LTGs. He is limited today due to new onset foot pain this past weekend (he is seeing his PCP for  this tomorrow). Pt has met LTG #1; however, LTG remains ongoing to improve his 5x STS. Provided pt new goals for strength, balance, and endurance. Pt continues to progress well with therapy and would benefit from continued PT to optimize his level of function for safe mobility at home and in the community.    Personal Factors and Comorbidities Comorbidity 3+;Time since onset of injury/illness/exacerbation    Comorbidities hypertension, hyperlipidemia, type 2 diabetes mellitus, restless leg syndrome, diabetic neuropathy, morbid obesity, tobacco abuse, depression/anxiety, GERD, chronic venous stasis ulcer in bilateral lower extremities    Examination-Activity Limitations Squat;Stand;Stairs    Examination-Participation Restrictions Community Activity;Yard Work;Shop    Rehab Potential Good    PT Frequency 1x / week    PT Duration 8 weeks    PT Treatment/Interventions ADLs/Self Care Home Management;Gait training;Stair training;Functional mobility training;Therapeutic activities;Therapeutic exercise;Balance training;Neuromuscular re-education;Patient/family education;Manual techniques;Passive range of motion;Energy conservation;Joint Manipulations    PT Next Visit Plan Walking outside for endurance. Continue exercises for BLE strengthening. Consider ambulating on compliant  surfaces and different levels. Provide upper body exercises to supplement his lower body exercises.    PT Home Exercise Plan Walking program: walking with family member for 5 min and gradually progressing it to 30 min    Consulted and Agree with Plan of Care Patient;Family member/caregiver    Family Member Consulted Wife [name])           Patient will benefit from skilled therapeutic intervention in order to improve the following deficits and impairments:  Abnormal gait, Decreased activity tolerance, Decreased mobility, Decreased endurance, Difficulty walking, Impaired sensation, Impaired vision/preception, Postural dysfunction  Visit  Diagnosis: Muscle weakness (generalized)  Unsteadiness on feet  Other abnormalities of gait and mobility  Other symptoms and signs involving cognitive functions following cerebral infarction     Problem List Patient Active Problem List   Diagnosis Date Noted  . Aortic calcification ([facility]) 02/22/2020  . Carotid artery calcification, bilateral 02/22/2020  . Depression   . GERD (gastroesophageal reflux disease)   . Ischemic stroke ([facility]), occipital   . Diabetic polyneuropathy associated with type 2 diabetes mellitus ([facility]) 06/30/2018  . Other chronic pain 06/30/2018  . Erectile dysfunction due to type 2 diabetes mellitus ([facility]) 06/30/2018  . Circulation disorder of lower extremity 05/31/2015  . IBS (irritable bowel syndrome) 12/24/2014  . 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 Diabetic leg ulcer ([facility]) 07/18/2013  . Restless legs syndrome (RLS) 11/20/2012  . Generalized anxiety disorder   . Type 2 diabetes mellitus with vascular disease ([place])   . Hyperlipidemia LDL goal <70 01/16/2009  . TOBACCO ABUSE 07/27/2007  . Morbid obesity ([facility]) 09/29/2006  . Essential hypertension 09/29/2006  . CALCULUS, KIDNEY 05/30/2002    [facility] [address] PT, DPT 04/21/2020, 6:34 PM  [facility] [address] [place], [place], [address] Phone: [phone]   Fax:  [phone]  Name: [name] [name] MRN: [id number] Date of Birth: 09/17/1960

## 2020-04-21 NOTE — Therapy (Signed)
[facility] [address] [place], [place], [address] Phone: [phone]   Fax:  [phone]  Occupational Therapy Treatment  Patient Details  Name: [name] [name] MRN: [id number] Date of Birth: 11/08/1960 Referring Provider (OT): Dr. Erlinda Hong   Encounter Date: 04/21/2020   OT End of Session - 04/21/20 1412    Visit Number 9    Number of Visits 16    Date for OT Re-Evaluation 05/17/20    Authorization Type Cigna/Cigna managed    OT Start Time 1318    OT Stop Time 1400    OT Time Calculation (min) 42 min    Activity Tolerance Patient tolerated treatment well;Other (comment)           Past Medical History:  Diagnosis Date  . Angio-edema   . Anxiety   . Depression   . Diabetes mellitus type II    type 2  . GERD (gastroesophageal reflux disease)   . Headache    migraine  . History of kidney stones   . HLD (hyperlipidemia)   . HTN (hypertension)   . Morbid obesity ([facility])   . Restless legs syndrome (RLS) 11/20/2012  . Stroke ([facility]) 02/14/2020   ischemic/right sided affected  . Tobacco abuse   . Ulcer of left ankle (HCC)    last 2 months dime size dry dressing changing q day  . Urticaria     Past Surgical History:  Procedure Laterality Date  . Arm surgery Right 1969   Abscess excision and debridement at elbow  . COLONOSCOPY WITH PROPOFOL N/A 09/06/2016   Procedure: COLONOSCOPY WITH PROPOFOL;  Surgeon: Doran Stabler, MD;  Location: [facility];  Service: Gastroenterology;  Laterality: N/A;  . I & D EXTREMITY Left 05/29/2014   Procedure: IRRIGATION AND DEBRIDEMENT OF LEFT LEG WOUND AND PLACEMENT OF  INTEGRA  AND  VAC;  Surgeon: Theodoro Kos, DO;  Location: [facility];  Service: Plastics;  Laterality: Left;  . implantable loop recorder implant  03/06/2020   Medtronic Reveal [place] model [id number] (SN  [id number]) implanted by Dr Rayann Heman for cryptogenic stroke  . INCISION AND DRAINAGE OF WOUND Left  04/23/2014   Procedure: IRRIGATION AND DEBRIDEMENT LOWER LEFT LEG WOUND WITH PLACEMENT OF INTEGRA AND VAC;  Surgeon: Theodoro Kos, DO;  Location: [facility];  Service: Plastics;  Laterality: Left;  . kidney stone removal  2004  . MINOR APPLICATION OF WOUND VAC Left 04/23/2014   Procedure: MINOR APPLICATION OF WOUND VAC;  Surgeon: Theodoro Kos, DO;  Location: [facility];  Service: Plastics;  Laterality: Left;  . SKIN SPLIT GRAFT Left 06/18/2014   Procedure: SKIN GRAFT SPLIT THICKNESS TO LOWER LEFT LEG WOUND WITH PLACEMENT OF VAC;  Surgeon: Theodoro Kos, DO;  Location: [facility];  Service: Plastics;  Laterality: Left;  Marland Kitchen VASECTOMY  1994    There were no vitals filed for this visit.   Subjective Assessment - 04/21/20 1414    Subjective  You are first today    Pertinent History CVA 02/14/20 Lt occipital lobe. PMH: 2 small previous strokes, DM, GERD, HLD, HTN (but now has issues w/ orthostasis)    Limitations no driving, loop recorder    Currently in Pain? Yes    Pain Score 2     Pain Location Foot    Pain Orientation Left    Pain Descriptors / Indicators Aching    Pain Type Neuropathic pain    Pain Onset More than  a month ago                     Treatment: Pipetree design x 2 min v.c initially, then pt completed second design without difficulty, pt organized the pieces without prompting. Number cancellation 2 M, with errors on 7/13 rows to mark the repeating item, increased time required. Placing grooved pegs with RUE then removing with tweezers for increased fine motor coordination, min difficulty.              OT Short Term Goals - 04/15/20 1025      OT SHORT TERM GOAL #1   Title Pt will be independent with HEP for Rt hand coordination and Rt shoulder (low range strengthening)    Time 4    Period Weeks    Status Achieved   MET for coordination     OT SHORT TERM GOAL #2   Title Pt will be independent with visual  scanning strategies to compensate for Rt visual field cut    Time 4    Period Weeks    Status Achieved      OT SHORT TERM GOAL #3   Title Improve coordination Rt hand to 28 sec or less for high level tasks    Baseline 33.91 sec    Time 4    Period Weeks    Status On-going   31.65 sec     OT SHORT TERM GOAL #4   Title Pt will verbalize understanding with memory compensatory strategies and utilize for medication and financial management    Time 4    Period Weeks    Status Achieved      OT SHORT TERM GOAL #5   Title Pt will perform simple financial management tasks using calculator prn with supervision and no more than min v.c's    Time 4    Period Weeks    Status Achieved   min cues to copy numbers correctly from calculator to paper     OT SHORT TERM GOAL #6   Title Pt will perform simple stovetop task with supervision only    Time 4    Period Weeks    Status Partially Met   per pt report (assist only to take something off stove or out of oven for safety d/t vision, supervision otherwise)     OT SHORT TERM GOAL #7   Title Pt will perform tabletop scanning with 100% accuracy and environmental scanning with 75% or greater accuracy    Time 4    Period Weeks    Status Partially Met   met w/ environmental scanning (92%), but inconsistent with tabletop scanning            OT Long Term Goals - 04/21/20 1650      OT LONG TERM GOAL #1   Title Pt to return to cooking with distant supervision    Time 8    Period Weeks    Status New      OT LONG TERM GOAL #2   Title Pt will complete meal plan for the week and develop corresponding grocery list I'ly    Time 8    Period Weeks    Status New      OT LONG TERM GOAL #3   Title Pt will perform full financial management tasks at 100% accuracy with no cues    Time 8    Period Weeks    Status New      OT LONG TERM  GOAL #4   Title Pt will demo organizational and problem solving skills to complete and adapt a schedule prn    Time  8    Period Weeks    Status New      OT LONG TERM GOAL #5   Title Pt will perform environmental scanning with 90% or greater accuracy    Time 8    Period Weeks    Status New                 Plan - 04/21/20 1413    Clinical Impression Statement Pt is progressing towards goals.    OT Occupational Profile and History Detailed Assessment- Review of Records and additional review of physical, cognitive, psychosocial history related to current functional performance    Occupational performance deficits (Please refer to evaluation for details): ADL's;IADL's;Work;Leisure    Body Structure / Function / Physical Skills ADL;Strength;Balance;UE functional use;IADL;Vision;Coordination;Mobility;Sensation;FMC    Cognitive Skills Memory;Perception;Problem Solve;Sequencing    Rehab Potential Good    Clinical Decision Making Several treatment options, min-mod task modification necessary    Comorbidities Affecting Occupational Performance: May have comorbidities impacting occupational performance    Modification or Assistance to Complete Evaluation  Min-Moderate modification of tasks or assist with assess necessary to complete eval    OT Frequency 2x / week    OT Duration 8 weeks   plus eval   OT Treatment/Interventions Self-care/ADL training;DME and/or AE instruction;Therapeutic activities;Therapeutic exercise;Cognitive remediation/compensation;Coping strategies training;Functional Mobility Training;Neuromuscular education;Visual/perceptual remediation/compensation;Patient/family education    Plan meal planning, simple cooking    Consulted and Agree with Plan of Care Patient;Family member/caregiver    Family Member Consulted wife           Patient will benefit from skilled therapeutic intervention in order to improve the following deficits and impairments:   Body Structure / Function / Physical Skills: ADL, Strength, Balance, UE functional use, IADL, Vision, Coordination, Mobility, Sensation,  [facility] Cognitive Skills: Memory, Perception, Problem Solve, Sequencing     Visit Diagnosis: Muscle weakness (generalized)  Other symptoms and signs involving cognitive functions following cerebral infarction  Other lack of coordination  Visuospatial deficit  Right homonymous hemianopsia    Problem List Patient Active Problem List   Diagnosis Date Noted  . Aortic calcification ([facility]) 02/22/2020  . Carotid artery calcification, bilateral 02/22/2020  . Depression   . GERD (gastroesophageal reflux disease)   . Ischemic stroke ([facility]), occipital   . Diabetic polyneuropathy associated with type 2 diabetes mellitus ([facility]) 06/30/2018  . Other chronic pain 06/30/2018  . Erectile dysfunction due to type 2 diabetes mellitus ([facility]) 06/30/2018  . Circulation disorder of lower extremity 05/31/2015  . IBS (irritable bowel syndrome) 12/24/2014  . 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 Diabetic leg ulcer ([facility]) 07/18/2013  . Restless legs syndrome (RLS) 11/20/2012  . Generalized anxiety disorder   . Type 2 diabetes mellitus with vascular disease ([facility])   . Hyperlipidemia LDL goal <70 01/16/2009  . TOBACCO ABUSE 07/27/2007  . Morbid obesity ([facility]) 09/29/2006  . Essential hypertension 09/29/2006  . CALCULUS, KIDNEY 05/30/2002    Olivianna Higley 04/21/2020, 4:53 PM Theone Murdoch, OTR/L [phone] Phone: [phone] 4:54 PM 04/21/20 [facility] [address] [place], [place], [address] Phone: [phone]   Fax:  [phone]  Name: [name] [name] MRN: [id number] Date of Birth: 1960/12/17

## 2020-04-21 NOTE — Telephone Encounter (Signed)
Patient called and requested 69 RX be sent for Glipizide and for [name] be sent to [facility] on [address]. Randa Lynn will only cover 90 day prescriptions

## 2020-04-22 ENCOUNTER — Ambulatory Visit (INDEPENDENT_AMBULATORY_CARE_PROVIDER_SITE_OTHER): Payer: Managed Care, Other (non HMO) | Admitting: Family Medicine

## 2020-04-22 ENCOUNTER — Other Ambulatory Visit: Payer: Self-pay | Admitting: Family Medicine

## 2020-04-22 ENCOUNTER — Encounter: Payer: Self-pay | Admitting: Family Medicine

## 2020-04-22 VITALS — BP 110/60 | HR 85 | Temp 97.1°F | Ht 69.5 in | Wt 308.2 lb

## 2020-04-22 DIAGNOSIS — E1159 Type 2 diabetes mellitus with other circulatory complications: Secondary | ICD-10-CM

## 2020-04-22 DIAGNOSIS — I639 Cerebral infarction, unspecified: Secondary | ICD-10-CM | POA: Diagnosis not present

## 2020-04-22 DIAGNOSIS — E1142 Type 2 diabetes mellitus with diabetic polyneuropathy: Secondary | ICD-10-CM

## 2020-04-22 DIAGNOSIS — F17200 Nicotine dependence, unspecified, uncomplicated: Secondary | ICD-10-CM

## 2020-04-22 DIAGNOSIS — F330 Major depressive disorder, recurrent, mild: Secondary | ICD-10-CM

## 2020-04-22 DIAGNOSIS — I10 Essential (primary) hypertension: Secondary | ICD-10-CM

## 2020-04-22 MED ORDER — EPINEPHRINE 0.3 MG/0.3ML IJ SOAJ: INTRAMUSCULAR | 0 refills | Status: DC

## 2020-04-22 MED ORDER — PAROXETINE HCL 40 MG PO TABS: 40.0000 mg | ORAL_TABLET | ORAL | 1 refills | Status: DC

## 2020-04-22 MED ORDER — PREGABALIN 75 MG PO CAPS
75.0000 mg | ORAL_CAPSULE | Freq: Three times a day (TID) | ORAL | 1 refills | Status: DC
Start: 2020-04-22 — End: 2020-10-19

## 2020-04-22 NOTE — Progress Notes (Signed)
Gwenlyn Hottinger T. Kyah Buesing, MD, CAQ Sports Medicine  Primary Care and Sports Medicine [facility] [address] [place], [address]  Phone: [phone]  FAX: [phone]  Sricharan Lacomb - 59 y.o. male  MRN [id number]  Date of Birth: 1960/10/10  Date: 04/22/2020  PCP: Hannah Beat, MD  Referral: Hannah Beat, MD  Chief Complaint  Patient presents with  . Follow-up    2 month    This visit occurred during the SARS-CoV-2 public health emergency.  Safety protocols were in place, including screening questions prior to the visit, additional usage of staff PPE, and extensive cleaning of exam room while observing appropriate contact time as indicated for disinfecting solutions.   Subjective:   [name] [name] is a 59 y.o. very pleasant male patient with Body mass index is 44.87 kg/m. who presents with the following:  F/u stroke He is doing outpatient rehab now. He is seeing Dr. Pearlean Brownie Memory is still and issue Neuro, had a difficult time with basic neuro tasks  Saw the eye MD, lost 70% of peripheral vision.  Cannot drive - he is not cleared until neurology has formally cleared him to drive.    ST, PT, and OT Speech has improved some.   Aphasia hit or miss   His wife [name] provides additional history  Reading is very difficulty  Still having some issues provessing.  Typing is an issue.   Gait issues and still getting help with pt  Still has a loop device.  He is in a research study, gives three pills a day.    DM - a1c is 6.5, this is much better than many, many years  F/u depression - he is depressed now more than he has been recently.  He does have a history of some significant depression, and he is on Paxil 20 mg.  He denies any SI or HI, but he does feel quite dejected.  Sleep is poor.  His interest in doing things is quite decreased.  F/u neuropathy  Seeing Dr. Everardo All for DM  BP is improved  Wt Readings from Last 3  Encounters:  04/22/20 (!) 308 lb 4 oz (139.8 kg)  04/10/20 (!) 307 lb (139.3 kg)  04/02/20 (!) 313 lb 9.6 oz (142.2 kg)    Trying to stop smoking  04/07/2020 Last OV with Hannah Beat, MD  Admit date: 02/14/2020 Discharge date: 02/15/2020   Acute left occipital lobe stroke with vision impairment.   The patient had a headache, then he went back to bed and he awoke and his headache was quite bad.  And he subsequently had vision impairment on the right and he went to his eye doctor.  His eye doctor felt as if this was not a vision issue predominantly and he needed to be evaluated for stroke.   By the time he went and arrived in the ER he was outside the window for TPA.   He was admitted by the hospitalist and had inpatient neurological consultation.  His imaging studies are below and reviewed.   He is currently working with [facility] neuro rehab.   He did stop his gabapentin for a while had significant recurrence of peripheral neuropathy, and this was better when he took his wife's Lyrica.  He would like to change to Lyrica.   He also has become more tearful, and he has a history of depression, and he does think that this is worse.   Neurology follow-up (Dr. Pearlean Brownie at [facility]) --  seeing at the Nov 18th. Loop recorder? Cardiology  PT and OT referral made  - NEUROREHAB   WIFE STAYED in the hospital   R side with vision loss   Cardiology has not been set up (loop recorder)   SEND A NOTE TO CARDIOLOGY TO ASK ABOUT THE LOOP RECORDER.  WANTS TO TRY SOME LYRICA - CHANGE FROM GABAPENTIN   Eating healthier   He also wants to quit smoking.  He has a significant drive to lose weight.   Review of Systems is noted in the HPI, as appropriate  Objective:   BP 110/60   Pulse 85   Temp (!) 97.1 F (36.2 C) (Temporal)   Ht 5' 9.5" (1.765 m)   Wt (!) 308 lb 4 oz (139.8 kg)   SpO2 97%   BMI 44.87 kg/m   GEN: No acute distress; alert,appropriate. PULM: Breathing comfortably in no respiratory  distress Neuropsych: He is able to have a conversation.  Aphasic at times.  Requires redirection at times.  More labile than normal.  He appears anxious. CV: RRR, no m/g/r  PULM: Normal respiratory rate, no accessory muscle use. No wheezes, crackles or rhonchi     Laboratory and Imaging Data:  Assessment and Plan:     ICD-10-CM   1. Ischemic stroke (HCC), occipital  I63.9   2. Type 2 diabetes mellitus with vascular disease (HCC)  E11.59   3. Morbid obesity (HCC)  E66.01   4. Diabetic polyneuropathy associated with type 2 diabetes mellitus (HCC)  E11.42   5. Essential hypertension  I10   6. TOBACCO ABUSE  F17.200   7. Mild episode of recurrent major depressive disorder (HCC)  F33.0    Slowly is improving, her continues to have some difficulties in multiple arenas from a neurological standpoint.  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  He is trying to quit smoking, he has lost a lot of weight.  His diabetes is much under control, and Lyrica has helped his neuropathy.  Increase this to 3 times daily dosing.  He has had some depression in the past, and he has had a recurrent flareup now with mild to moderate depression.  He has no SI or HI.  We are going to increase his Paxil and have follow-up.  His wife is an Char[employer] fundraiser, and she is very active in his care.   Meds ordered this encounter  Medications  . pregabalin (LYRICA) 75 MG capsule    Sig: Take 1 capsule (75 mg total) by mouth 3 (three) times daily.    Dispense:  270 capsule    Refill:  1  . PARoxetine (PAXIL) 40 MG tablet    Sig: Take 1 tablet (40 mg total) by mouth every morning.    Dispense:  90 tablet    Refill:  1   Medications Discontinued During This Encounter  Medication Reason  . pregabalin (LYRICA) 75 MG capsule Reorder  . PARoxetine (PAXIL) 30 MG tablet    No orders of the defined types were placed in this encounter.   Follow-up: Return in about 3 months (around  07/23/2020).  Signed,  Elpidio Galea. Lachanda Buczek, MD   Outpatient Encounter Medications as of 04/22/2020  Medication Sig  . acetaminophen (TYLENOL) 500 MG tablet Take 1,000 mg by mouth 2 (two) times daily as needed (for pain).  Marland Kitchen aspirin EC 81 MG EC tablet Take 1 tablet (81 mg total) by mouth daily. Swallow whole.  Marland Kitchen  bromocriptine (PARLODEL) 2.5 MG tablet Take 0.5 tablets (1.25 mg total) by mouth at bedtime.  . colesevelam (WELCHOL) 625 MG tablet Take 2 tablets (1,250 mg total) by mouth daily.  . dapagliflozin propanediol (FARXIGA) 10 MG TABS tablet TAKE 1 TABLET BY MOUTH DAILY BEFORE BREAKFAST  . Dulaglutide (TRULICITY) 4.5 MG/0.5ML SOPN Inject 4.5 mg into the skin once a week.  Marland Kitchen glipiZIDE (GLUCOTROL XL) 10 MG 24 hr tablet Take 1 tablet (10 mg total) by mouth every morning. TAKE 2 TABLETS BY MOUTH  DAILY WITH BREAKFAST (Patient taking differently: Take 10 mg by mouth every morning. )  . glucose blood (CONTOUR NEXT TEST) test strip Use to check blood sugar two times a day.  . Lancets Micro Thin 33G MISC Use to check blood sugar two times daily.  Marland Kitchen lisinopril-hydrochlorothiazide (ZESTORETIC) 20-12.5 MG tablet Take 0.5 tablets by mouth daily. (Patient taking differently: Take 1 tablet by mouth daily. )  . metFORMIN (GLUCOPHAGE-XR) 500 MG 24 hr tablet TAKE 4 TABLETS BY MOUTH  DAILY WITH BREAKFAST  . Multiple Vitamin (MULTIVITAMIN) tablet Take 1 tablet by mouth daily.    . pantoprazole (PROTONIX) 40 MG tablet TAKE 1 TABLET BY MOUTH  DAILY  . pentoxifylline (TRENTAL) 400 MG CR tablet TAKE 1 TABLET BY MOUTH 3  TIMES DAILY WITH MEALS  . pregabalin (LYRICA) 75 MG capsule Take 1 capsule (75 mg total) by mouth 3 (three) times daily.  Marland Kitchen rOPINIRole (REQUIP) 0.5 MG tablet TAKE 1 TABLET BY MOUTH  TWICE DAILY  . rosuvastatin (CRESTOR) 20 MG tablet Take 1 tablet (20 mg total) by mouth at bedtime.  . sildenafil (VIAGRA) 100 MG tablet Take 1 tablet (100 mg total) by mouth daily as needed for erectile dysfunction.   . [DISCONTINUED] EPINEPHrine 0.3 mg/0.3 mL IJ SOAJ injection INJECT 0.3 ML INTO THE  MUSCLE ONCE FOR 1 DOSE.  . [DISCONTINUED] PARoxetine (PAXIL) 30 MG tablet TAKE 1 TABLET BY MOUTH IN  THE MORNING  . [DISCONTINUED] pregabalin (LYRICA) 75 MG capsule Take 1 capsule (75 mg total) by mouth 2 (two) times daily.  Marland Kitchen PARoxetine (PAXIL) 40 MG tablet Take 1 tablet (40 mg total) by mouth every morning.   No facility-administered encounter medications on file as of 04/22/2020.

## 2020-04-22 NOTE — Telephone Encounter (Signed)
Last OV 04/22/20 Last fill 10/14/19

## 2020-04-26 MED ORDER — ROSUVASTATIN CALCIUM 20 MG PO TABS
20.0000 mg | ORAL_TABLET | Freq: Every day | ORAL | 0 refills | Status: DC
Start: 2020-04-26 — End: 2020-07-17

## 2020-04-26 NOTE — Addendum Note (Signed)
Addended by: Roena Malady on: 04/26/2020 06:26 PM   Modules accepted: Orders

## 2020-04-27 ENCOUNTER — Ambulatory Visit: Payer: Managed Care, Other (non HMO) | Admitting: Speech Pathology

## 2020-04-27 ENCOUNTER — Other Ambulatory Visit: Payer: Self-pay

## 2020-04-27 ENCOUNTER — Encounter: Payer: Self-pay | Admitting: Speech Pathology

## 2020-04-27 ENCOUNTER — Ambulatory Visit: Payer: Managed Care, Other (non HMO) | Admitting: Occupational Therapy

## 2020-04-27 ENCOUNTER — Encounter: Payer: Self-pay | Admitting: Physical Therapy

## 2020-04-27 ENCOUNTER — Ambulatory Visit: Payer: Managed Care, Other (non HMO) | Admitting: Physical Therapy

## 2020-04-27 DIAGNOSIS — I69318 Other symptoms and signs involving cognitive functions following cerebral infarction: Secondary | ICD-10-CM

## 2020-04-27 DIAGNOSIS — R2681 Unsteadiness on feet: Secondary | ICD-10-CM

## 2020-04-27 DIAGNOSIS — R41842 Visuospatial deficit: Secondary | ICD-10-CM

## 2020-04-27 DIAGNOSIS — R41841 Cognitive communication deficit: Secondary | ICD-10-CM

## 2020-04-27 DIAGNOSIS — M6281 Muscle weakness (generalized): Secondary | ICD-10-CM

## 2020-04-27 DIAGNOSIS — R278 Other lack of coordination: Secondary | ICD-10-CM

## 2020-04-27 DIAGNOSIS — R4701 Aphasia: Secondary | ICD-10-CM

## 2020-04-27 NOTE — Therapy (Signed)
[facility] [address] [place], [place], [address] Phone: [phone]   Fax:  [phone]  Physical Therapy Treatment  Patient Details  Name: [name] LauraHerbert [name] MRN: [id number] Date of Birth: [birth year] Referring Provider (PT): Dr. Dallas Schim[place]   Encounter Date: 04/27/2020   PT End of Session - 04/27/20 1129    Visit Number 9    Number of Visits 17    Date for PT Re-Evaluation 06/16/20    Authorization Type [employer] Managed    Progress Note Due on Visit 9    PT Start Time 1146    PT Stop Time 1230    PT Time Calculation (min) 44 min    Equipment Utilized During Treatment Gait belt    Activity Tolerance Patient tolerated treatment well    Behavior During Therapy WFL for tasks assessed/performed           Past Medical History:  Diagnosis Date   Angio-edema    Anxiety    Depression    Diabetes mellitus type II    type 2   GERD (gastroesophageal reflux disease)    Headache    migraine   History of kidney stones    HLD (hyperlipidemia)    HTN (hypertension)    Morbid obesity (HCC)    Restless legs syndrome (RLS) 11/20/2012   Stroke (HCC) 02/14/2020   ischemic/right sided affected   Tobacco abuse    Ulcer of left ankle (HCC)    last 2 months dime size dry dressing changing q day   Urticaria     Past Surgical History:  Procedure Laterality Date   Arm surgery Right 1969   Abscess excision and debridement at elbow   COLONOSCOPY WITH PROPOFOL N/A 09/06/2016   Procedure: COLONOSCOPY WITH PROPOFOL;  Surgeon: Sherrilyn RistHenry L Danis III, MD;  [facility];  Service: Gastroenterology;  Laterality: N/A;   I & D EXTREMITY Left 05/29/2014   Procedure: IRRIGATION AND DEBRIDEMENT OF LEFT LEG WOUND AND PLACEMENT OF  INTEGRA  AND  VAC;  Surgeon: Wayland Denislaire Sanger, DO;  Location: [facility];  Service: Plastics;  Laterality: Left;   implantable loop recorder implant  03/06/2020   Medtronic Reveal  [place] model LNQ11 (SN  [id number]) implanted by Dr Johney FrameAllred for cryptogenic stroke   INCISION AND DRAINAGE OF WOUND Left 04/23/2014   Procedure: IRRIGATION AND DEBRIDEMENT LOWER LEFT LEG WOUND WITH PLACEMENT OF INTEGRA AND VAC;  Surgeon: Wayland Denislaire Sanger, DO;  Location: [facility];  Service: Plastics;  Laterality: Left;   kidney stone removal  2004   MINOR APPLICATION OF WOUND VAC Left 04/23/2014   Procedure: MINOR APPLICATION OF WOUND VAC;  Surgeon: Wayland Denislaire Sanger, DO;  Location: [facility];  Service: Plastics;  Laterality: Left;   SKIN SPLIT GRAFT Left 06/18/2014   Procedure: SKIN GRAFT SPLIT THICKNESS TO LOWER LEFT LEG WOUND WITH PLACEMENT OF VAC;  Surgeon: Wayland Denislaire Sanger, DO;  Location: [facility];  Service: Plastics;  Laterality: Left;   VASECTOMY  1994    There were no vitals filed for this visit.   Subjective Assessment - 04/27/20 1147    Subjective Pt states his foot is no longer hurting. Pt had a good Thanksgiving. Nothing new to report.    Pertinent History hypertension, hyperlipidemia, type 2 diabetes mellitus, restless leg syndrome, diabetic neuropathy, morbid obesity, tobacco abuse, depression/anxiety, GERD, chronic venous stasis ulcer in bilateral lower extremities    Limitations Reading;Walking;House hold activities    How long  can you sit comfortably? no issues    How long can you stand comfortably? 10 min    How long can you walk comfortably? 5 min    Diagnostic tests CT head showed Hyperdense right MCA.  CTA head and neck and perfusion study showed 10 cc region of completed infarction in left occipital cortex and additional 11 cc at risk/penumbra. [name] [name] (wife) reports that short term memory is affected.    Patient Stated Goals improve walking    Currently in Pain? No/denies                             [facility] Adult PT Treatment/Exercise - 04/27/20 0001      Ambulation/Gait   Ambulation/Gait Yes    Ambulation/Gait  Assistance 4: Min guard    Ambulation/Gait Assistance Details 2 standing rest breaks to stretch hip    Ambulation Distance (Feet) 1025 Feet    Assistive device Straight cane    Gait Pattern Step-through pattern;Decreased weight shift to left    Ambulation Surface Level;Unlevel;Indoor;Outdoor;Paved    Gait Comments 25' no a/d CGA for safety in // bars      Lumbar Exercises: Standing   Wall Slides 10 reps    Row Strengthening;Both;20 reps;Theraband    Theraband Level (Row) Level 2 (Red)    Shoulder Extension Strengthening;20 reps;Theraband    Theraband Level (Shoulder Extension) Level 2 (Red)    Other Standing Lumbar Exercises Palloff press 2x10 with red tband   feet apart first set, feet together 2nd set     Knee/Hip Exercises: Stretches   Passive Hamstring Stretch Both;30 seconds;2 reps    Hip Flexor Stretch Both;2 reps;30 seconds    Other Knee/Hip Stretches Hip circles CW & CCW x 5      Knee/Hip Exercises: Standing   Other Standing Knee Exercises Lateral band walk 2x10 reps with red tband around knees    Other Standing Knee Exercises Monster walk forward/backward with red tband around knees 2x10                  PT Education - 04/27/20 1217    Education Details Discussed trunk and upper body strengthening for posture. Provided HEP for this    Person(s) Educated Patient    Methods Handout;Explanation    Comprehension Verbalized understanding            PT Short Term Goals - 03/25/20 1131      PT SHORT TERM GOAL #1   Title patient will be able to walk with or without AD for 10 min to improve walking endurance    Baseline <5 min without AD, 12 minutes with SPC    Time 4    Period Weeks    Status Achieved    Target Date 03/24/20             PT Long Term Goals - 04/21/20 1240      PT LONG TERM GOAL #1   Title Pt will be able to ambulate for 30 min with or without AD to improve walking endurance    Baseline <5 min without AD; able to amb 30 min with rest  breaks (04/21/20)    Time 8    Period Weeks    Status Revised    Target Date 06/16/20      PT LONG TERM GOAL #2   Title Pt will demo 12 sec or less with 5x/ sit to stand without HHA to  improve functional strength    Baseline 15 sec without HHA; 13.41 sec (04/21/20)    Time 8    Period Weeks    Status Revised    Target Date 06/16/20      PT LONG TERM GOAL #3   Title Pt will increase DGI score to at least 20/24 to demo decreased fall risk    Baseline 17/24 on 04/21/20    Time 8    Period Weeks    Status New    Target Date 06/16/20      PT LONG TERM GOAL #4   Title Pt and family would like pt to be able to squat at least 25# to demo improved bilat LE and trunk strength    Time 8    Period Weeks    Status New    Target Date 06/16/20      PT LONG TERM GOAL #5   Title Pt would like to be able to ambulate 6 min at least 581 ft without a/d to demo improved endurance and community mobility    Baseline 481' on 6 MWT with use of SPC (04/21/20)    Time 8    Period Weeks    Status New    Target Date 06/16/20                 Plan - 04/27/20 1803    Clinical Impression Statement Treatment focused on initiating trunk and upper body strengthening. Continued to progress pt's bilat LE strengthening and endurance with amb. Pt able to tolerate ambulating around the whole perimeter of the building this session with only 2 standing rest breaks within 11 min.    Personal Factors and Comorbidities Comorbidity 3+;Time since onset of injury/illness/exacerbation    Comorbidities hypertension, hyperlipidemia, type 2 diabetes mellitus, restless leg syndrome, diabetic neuropathy, morbid obesity, tobacco abuse, depression/anxiety, GERD, chronic venous stasis ulcer in bilateral lower extremities    Examination-Activity Limitations Squat;Stand;Stairs    Examination-Participation Restrictions Community Activity;Yard Work;Shop    Rehab Potential Good    PT Frequency 1x / week    PT Duration 8 weeks     PT Treatment/Interventions ADLs/Self Care Home Management;Gait training;Stair training;Functional mobility training;Therapeutic activities;Therapeutic exercise;Balance training;Neuromuscular re-education;Patient/family education;Manual techniques;Passive range of motion;Energy conservation;Joint Manipulations    PT Next Visit Plan Walking outside for endurance. Continue exercises for BLE strengthening. Consider ambulating on compliant surfaces and different levels. Provide upper body exercises to supplement his lower body exercises.    PT Home Exercise Plan Walking program: walking with family member for 5 min and gradually progressing it to 30 min    Consulted and Agree with Plan of Care Patient;Family member/caregiver    Family Member Consulted Wife [name] [name])           Patient will benefit from skilled therapeutic intervention in order to improve the following deficits and impairments:  Abnormal gait, Decreased activity tolerance, Decreased mobility, Decreased endurance, Difficulty walking, Impaired sensation, Impaired vision/preception, Postural dysfunction  Visit Diagnosis: Muscle weakness (generalized)  Other symptoms and signs involving cognitive functions following cerebral infarction  Other lack of coordination  Unsteadiness on feet     Problem List Patient Active Problem List   Diagnosis Date Noted   Aortic calcification (HCC) 02/22/2020   Carotid artery calcification, bilateral 02/22/2020   MDD (major depressive disorder), recurrent episode (HCC)    GERD (gastroesophageal reflux disease)    Ischemic stroke (HCC), occipital    Diabetic polyneuropathy associated with type 2 diabetes mellitus (HCC) 06/30/2018  Erectile dysfunction due to type 2 diabetes mellitus (HCC) 06/30/2018   Circulation disorder of lower extremity 05/31/2015   IBS (irritable bowel syndrome) 12/24/2014   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 [address] [place], [place], [address] Phone: [phone]   Fax:  [phone]  Name: [name] [name] MRN: [id number] Date of Birth: 01-02-1961

## 2020-04-27 NOTE — Patient Instructions (Signed)
   Neuropsychological evaluation - 6 months out  Show the Aphasia Treatment Study handout to your wife and contact Dr. Welton Flakes to get on her list to see if you qualify (may want to disclose visual field cuts)  Space  Bit  Count  Chest  Deck  Roll  [place]  Rare  [place]

## 2020-04-27 NOTE — Therapy (Signed)
[facility] [address] [place], [place], [address] Phone: [phone]   Fax:  [phone]  Occupational Therapy Treatment  Patient Details  Name: [name] [name] MRN: [id number] Date of Birth: 1960-08-22 Referring Provider (OT): Dr. Erlinda Hong   Encounter Date: 04/27/2020   OT End of Session - 04/27/20 1215    Visit Number 10    Number of Visits 16    Date for OT Re-Evaluation 05/17/20    Authorization Type Cigna/Cigna managed    OT Start Time 1100    OT Stop Time 1145    OT Time Calculation (min) 45 min    Activity Tolerance Patient tolerated treatment well;Other (comment)           Past Medical History:  Diagnosis Date  . Angio-edema   . Anxiety   . Depression   . Diabetes mellitus type II    type 2  . GERD (gastroesophageal reflux disease)   . Headache    migraine  . History of kidney stones   . HLD (hyperlipidemia)   . HTN (hypertension)   . Morbid obesity ([place])   . Restless legs syndrome (RLS) 11/20/2012  . Stroke ([place]) 02/14/2020   ischemic/right sided affected  . Tobacco abuse   . Ulcer of left ankle (HCC)    last 2 months dime size dry dressing changing q day  . Urticaria     Past Surgical History:  Procedure Laterality Date  . Arm surgery Right 1969   Abscess excision and debridement at elbow  . COLONOSCOPY WITH PROPOFOL N/A 09/06/2016   Procedure: COLONOSCOPY WITH PROPOFOL;  Surgeon: Doran Stabler, MD;  Location: [facility];  Service: Gastroenterology;  Laterality: N/A;  . I & D EXTREMITY Left 05/29/2014   Procedure: IRRIGATION AND DEBRIDEMENT OF LEFT LEG WOUND AND PLACEMENT OF  INTEGRA  AND  VAC;  Surgeon: Theodoro Kos, DO;  Location: [facility];  Service: Plastics;  Laterality: Left;  . implantable loop recorder implant  03/06/2020   Medtronic Reveal [place] model LNQ11 (SN  [id number]) implanted by Dr Rayann Heman for cryptogenic stroke  . INCISION AND DRAINAGE OF WOUND Left  04/23/2014   Procedure: IRRIGATION AND DEBRIDEMENT LOWER LEFT LEG WOUND WITH PLACEMENT OF INTEGRA AND VAC;  Surgeon: Theodoro Kos, DO;  Location: [facility];  Service: Plastics;  Laterality: Left;  . kidney stone removal  2004  . MINOR APPLICATION OF WOUND VAC Left 04/23/2014   Procedure: MINOR APPLICATION OF WOUND VAC;  Surgeon: Theodoro Kos, DO;  Location: [facility];  Service: Plastics;  Laterality: Left;  . SKIN SPLIT GRAFT Left 06/18/2014   Procedure: SKIN GRAFT SPLIT THICKNESS TO LOWER LEFT LEG WOUND WITH PLACEMENT OF VAC;  Surgeon: Theodoro Kos, DO;  Location: [facility];  Service: Plastics;  Laterality: Left;  Marland Kitchen VASECTOMY  1994    There were no vitals filed for this visit.   Subjective Assessment - 04/27/20 1105    Subjective  I made the [place], ham, liver dressing, and the gravy for Thanksgiving. My wife or daughter helped getting in and out of oven. One time I was stirring the gravy but I wasn't inside the pot because of my vision.    Pertinent History CVA 02/14/20 Lt occipital lobe. PMH: 2 small previous strokes, DM, GERD, HLD, HTN (but now has issues w/ orthostasis)    Limitations no driving, loop recorder    Currently in Pain? No/denies  Pt made meal plan for 2 days (breakfast, lunch, dinner) followed by corresponding grocery list - pt required extra time d/t decreased processing and expressive aphasia however completed only leaving out 3 items in grocery list.  Pt then performing functional money exchange fairly accurately with occasional cues needed to recall amount d/t decreased processing and memory                       OT Short Term Goals - 04/15/20 1025      OT SHORT TERM GOAL #1   Title Pt will be independent with HEP for Rt hand coordination and Rt shoulder (low range strengthening)    Time 4    Period Weeks    Status Achieved   MET for coordination     OT SHORT TERM GOAL #2   Title Pt  will be independent with visual scanning strategies to compensate for Rt visual field cut    Time 4    Period Weeks    Status Achieved      OT SHORT TERM GOAL #3   Title Improve coordination Rt hand to 28 sec or less for high level tasks    Baseline 33.91 sec    Time 4    Period Weeks    Status On-going   31.65 sec     OT SHORT TERM GOAL #4   Title Pt will verbalize understanding with memory compensatory strategies and utilize for medication and financial management    Time 4    Period Weeks    Status Achieved      OT SHORT TERM GOAL #5   Title Pt will perform simple financial management tasks using calculator prn with supervision and no more than min v.c's    Time 4    Period Weeks    Status Achieved   min cues to copy numbers correctly from calculator to paper     OT SHORT TERM GOAL #6   Title Pt will perform simple stovetop task with supervision only    Time 4    Period Weeks    Status Partially Met   per pt report (assist only to take something off stove or out of oven for safety d/t vision, supervision otherwise)     OT SHORT TERM GOAL #7   Title Pt will perform tabletop scanning with 100% accuracy and environmental scanning with 75% or greater accuracy    Time 4    Period Weeks    Status Partially Met   met w/ environmental scanning (92%), but inconsistent with tabletop scanning            OT Long Term Goals - 04/21/20 1650      OT LONG TERM GOAL #1   Title Pt to return to cooking with distant supervision    Time 8    Period Weeks    Status New      OT LONG TERM GOAL #2   Title Pt will complete meal plan for the week and develop corresponding grocery list I'ly    Time 8    Period Weeks    Status New      OT LONG TERM GOAL #3   Title Pt will perform full financial management tasks at 100% accuracy with no cues    Time 8    Period Weeks    Status New      OT LONG TERM GOAL #4   Title Pt will demo organizational and problem  solving skills to complete  and adapt a schedule prn    Time 8    Period Weeks    Status New      OT LONG TERM GOAL #5   Title Pt will perform environmental scanning with 90% or greater accuracy    Time 8    Period Weeks    Status New                 Plan - 04/27/20 1217    Clinical Impression Statement Pt progressing towards goals. Pt with improved writing (aphasia biggest deficit, vision impacting some)    OT Occupational Profile and History Detailed Assessment- Review of Records and additional review of physical, cognitive, psychosocial history related to current functional performance    Occupational performance deficits (Please refer to evaluation for details): ADL's;IADL's;Work;Leisure    Body Structure / Function / Physical Skills ADL;Strength;Balance;UE functional use;IADL;Vision;Coordination;Mobility;Sensation;FMC    Cognitive Skills Memory;Perception;Problem Solve;Sequencing    Rehab Potential Good    Clinical Decision Making Several treatment options, min-mod task modification necessary    Comorbidities Affecting Occupational Performance: May have comorbidities impacting occupational performance    Modification or Assistance to Complete Evaluation  Min-Moderate modification of tasks or assist with assess necessary to complete eval    OT Frequency 2x / week    OT Duration 8 weeks   plus eval   OT Treatment/Interventions Self-care/ADL training;DME and/or AE instruction;Therapeutic activities;Therapeutic exercise;Cognitive remediation/compensation;Coping strategies training;Functional Mobility Training;Neuromuscular education;Visual/perceptual remediation/compensation;Patient/family education    Plan simple cooking following recipe if able    Consulted and Agree with Plan of Care Patient;Family member/caregiver    Family Member Consulted wife           Patient will benefit from skilled therapeutic intervention in order to improve the following deficits and impairments:   Body Structure /  Function / Physical Skills: ADL, Strength, Balance, UE functional use, IADL, Vision, Coordination, Mobility, Sensation, [facility] Cognitive Skills: Memory, Perception, Problem Solve, Sequencing     Visit Diagnosis: Other symptoms and signs involving cognitive functions following cerebral infarction  Visuospatial deficit    Problem List Patient Active Problem List   Diagnosis Date Noted  . Aortic calcification ([place]) 02/22/2020  . Carotid artery calcification, bilateral 02/22/2020  . MDD (major depressive disorder), recurrent episode ([place])   . GERD (gastroesophageal reflux disease)   . Ischemic stroke ([place]), occipital   . Diabetic polyneuropathy associated with type 2 diabetes mellitus ([place]) 06/30/2018  . Erectile dysfunction due to type 2 diabetes mellitus ([place]) 06/30/2018  . Circulation disorder of lower extremity 05/31/2015  . IBS (irritable bowel syndrome) 12/24/2014  . 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 Diabetic leg ulcer ([place]) 07/18/2013  . Restless legs syndrome (RLS) 11/20/2012  . Generalized anxiety disorder   . Type 2 diabetes mellitus with vascular disease ([place])   . Hyperlipidemia LDL goal <70 01/16/2009  . TOBACCO ABUSE 07/27/2007  . Morbid obesity ([place]) 09/29/2006  . Essential hypertension 09/29/2006  . CALCULUS, KIDNEY 05/30/2002    Carey Bullocks, OTR/L 04/27/2020, 12:18 PM  [facility] [address] [place], [place], [address] Phone: [phone]   Fax:  [phone]  Name: [name] [name] MRN: [id number] Date of Birth: 1961-01-10

## 2020-04-28 ENCOUNTER — Ambulatory Visit: Payer: Managed Care, Other (non HMO) | Admitting: Physical Therapy

## 2020-04-28 NOTE — Therapy (Signed)
[facility] [address] [place], [place], [address] Phone: [phone]   Fax:  [phone]  Speech Language Pathology Treatment  Patient Details  Name: [name] [name] MRN: [id number] Date of Birth: 03-Jul-1960 Referring Provider (SLP): Bonnielee Haff (referring); Copland, Frederico Hamman (PCP/documentation)   Encounter Date: 04/27/2020   End of Session - 04/28/20 0950    Visit Number 12    Number of Visits 17    Date for SLP Re-Evaluation 05/02/20    SLP Start Time 1231    SLP Stop Time  1314    SLP Time Calculation (min) 43 min    Activity Tolerance Patient tolerated treatment well           Past Medical History:  Diagnosis Date  . Angio-edema   . Anxiety   . Depression   . Diabetes mellitus type II    type 2  . GERD (gastroesophageal reflux disease)   . Headache    migraine  . History of kidney stones   . HLD (hyperlipidemia)   . HTN (hypertension)   . Morbid obesity ([place])   . Restless legs syndrome (RLS) 11/20/2012  . Stroke ([facility]) 02/14/2020   ischemic/right sided affected  . Tobacco abuse   . Ulcer of left ankle ([facility])    last 2 months dime size dry dressing changing q day  . Urticaria     Past Surgical History:  Procedure Laterality Date  . Arm surgery Right 1969   Abscess excision and debridement at elbow  . COLONOSCOPY WITH PROPOFOL N/A 09/06/2016   Procedure: COLONOSCOPY WITH PROPOFOL;  Surgeon: Doran Stabler, MD;  Location: [facility];  Service: Gastroenterology;  Laterality: N/A;  . I & D EXTREMITY Left 05/29/2014   Procedure: IRRIGATION AND DEBRIDEMENT OF LEFT LEG WOUND AND PLACEMENT OF  INTEGRA  AND  VAC;  Surgeon: Theodoro Kos, DO;  Location: [facility];  Service: Plastics;  Laterality: Left;  . implantable loop recorder implant  03/06/2020   Medtronic Reveal [place] model LNQ11 (SN  [id number]) implanted by Dr Rayann Heman for cryptogenic stroke  . INCISION AND DRAINAGE OF WOUND  Left 04/23/2014   Procedure: IRRIGATION AND DEBRIDEMENT LOWER LEFT LEG WOUND WITH PLACEMENT OF INTEGRA AND VAC;  Surgeon: Theodoro Kos, DO;  Location: [facility];  Service: Plastics;  Laterality: Left;  . kidney stone removal  2004  . MINOR APPLICATION OF WOUND VAC Left 04/23/2014   Procedure: MINOR APPLICATION OF WOUND VAC;  Surgeon: Theodoro Kos, DO;  Location: [facility];  Service: Plastics;  Laterality: Left;  . SKIN SPLIT GRAFT Left 06/18/2014   Procedure: SKIN GRAFT SPLIT THICKNESS TO LOWER LEFT LEG WOUND WITH PLACEMENT OF VAC;  Surgeon: Theodoro Kos, DO;  Location: [facility];  Service: Plastics;  Laterality: Left;  Marland Kitchen VASECTOMY  1994    There were no vitals filed for this visit.   Subjective Assessment - 04/27/20 1235    Subjective "I did fine, I did cooking"    Currently in Pain? No/denies                 ADULT SLP TREATMENT - 04/27/20 1237      General Information   Behavior/Cognition Alert;Cooperative;Pleasant mood      Treatment Provided   Treatment provided Cognitive-Linquistic      Cognitive-Linquistic Treatment   Treatment focused on Cognition;Patient/family/caregiver education    Skilled Treatment [name] [name] had success meal planning, making grocery list and cooking [place],  ham, gravy and dressing.  He was able to partcipate in conversation around the table. [name] [name] did an excellent job educating his guests about aphasia, slow processing. At one point a guest attempted to have a converatoin with him while he was washing dishes and [name] [name] advocated for himself that he could not multitask and had the guest wait until he finished the dishes before conversing with guest. Written expression targeted at sentence level, generating simple multiple meaning sentences with extended time consistently and occasional min A for error awareness and self correction. In converation, mild aphasia persists, with 3 word finding episodes over 12 minute  conversation. With pause/extended time, [name] [name] used synonym or found the word with rare min A. Reading comprehension at word and sentence level also rerqiure extended time consistently. Despite bilateral visual field cut, [name] [name] reports he can see the word accurately, but doesn't recognized it right away      Assessment / Recommendations / [place] with current plan of care      Progression Toward Goals   Progression toward goals Progressing toward goals            SLP Education - 04/28/20 0947    Education Details Neuropsych eval 6 months post, [employer] reading/writing study, compensations for aphasia    Person(s) Educated Patient    Methods Explanation;Verbal cues;Handout    Comprehension Verbalized understanding;Returned demonstration            SLP Short Term Goals - 04/28/20 0948      SLP SHORT TERM GOAL #1   Title Patient will participate functionally in 8 minutes simple-mod complex conversation using compensations for anomia as necessary, x3 visits.    Baseline 03/31/20 04/02/20    Time 1    Period Weeks    Status Partially Met      SLP SHORT TERM GOAL #2   Title Pt will demonstrate auditory comprehension of 5 minutes simple-mod complex material by answering [place] questions >90% accuracy x 2 sessions.    Baseline 04/02/20    Time 1    Period Weeks    Status Partially Met      SLP SHORT TERM GOAL #3   Title Pt will participate in assessment of reading, writing and cognition with goals added PRN    Baseline writing assessed 03/12/20; allowing more time for assessment due to anxiety    Time 1    Period Weeks    Status Partially Met            SLP Long Term Goals - 04/28/20 5643      SLP LONG TERM GOAL #1   Title Patient will participate functionally in 15 minutes mod complex-complex conversation using compensations as necessary x 3 visits.    Baseline 04/28/20;    Time 2    Period Weeks    Status On-going      SLP LONG TERM GOAL #2   Title Patient will  demonstrate auditory comprehension of 15 minutes mod complex content (conversation, podcast, radio program, etc) x 3 visits by answering detailed questions >90% accuracy.    Time 2    Period Weeks    Status On-going      SLP LONG TERM GOAL #3   Title Patient will report using accessibility features to read and reply to text messages x 3 visits.    Time 3    Period Weeks    Status Achieved      SLP LONG TERM GOAL #4  Title Pt will use compensations for processing mod complex written/verbal information with mod I x 2 sessions.    Time 3    Period Weeks    Status On-going      SLP LONG TERM GOAL #5   Title Pt will complete mod complex functional problem solving activities >90% accuracy with extra time.    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 Error awareness/correction is a relative strength, however [name] [name] has slow processing, impaired problem solving, memory, and visuospatial skills.  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 [name] [name] remains motivated for progress, and works diligently outside of therapy on tasks assigned in session and therapy apps on his tablet. I recommend skilled ST to maximize communication and cognitive abilities to reduce frustration, improve language abilities, safety, and for possible return to work.    Speech Therapy Frequency 2x / week    Duration 8 weeks   or 17 visits   Treatment/Interventions Language facilitation;Environmental controls;Cueing hierarchy;SLP instruction and feedback;Cognitive reorganization;Compensatory techniques;Functional tasks;Compensatory strategies;Internal/external aids;Multimodal communcation  approach;Patient/family education    Potential to Achieve Goals Good           Patient will benefit from skilled therapeutic intervention in order to improve the following deficits and impairments:   Aphasia  Cognitive communication deficit    Problem List Patient Active Problem List   Diagnosis Date Noted  . Aortic calcification ([facility]) 02/22/2020  . Carotid artery calcification, bilateral 02/22/2020  . MDD (major depressive disorder), recurrent episode ([facility])   . GERD (gastroesophageal reflux disease)   . Ischemic stroke ([facility]), occipital   . Diabetic polyneuropathy associated with type 2 diabetes mellitus ([facility]) 06/30/2018  . Erectile dysfunction due to type 2 diabetes mellitus ([facility]) 06/30/2018  . Circulation disorder of lower extremity 05/31/2015  . IBS (irritable bowel syndrome) 12/24/2014  . 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 Diabetic leg ulcer ([facility]) 07/18/2013  . Restless legs syndrome (RLS) 11/20/2012  . Generalized anxiety disorder   . Type 2 diabetes mellitus with vascular disease ([facility])   . Hyperlipidemia LDL goal <70 01/16/2009  . TOBACCO ABUSE 07/27/2007  . Morbid obesity ([facility]) 09/29/2006  . Essential hypertension 09/29/2006  . CALCULUS, KIDNEY 05/30/2002    [name] [name], Annye Rusk MS, CCC-SLP 04/28/2020, 9:50 AM  [facility] [address] [place], [place], [address] Phone: [phone]   Fax:  [phone]   Name: [name] [name] MRN: [id number] Date of Birth: 07-Sep-1960

## 2020-04-29 ENCOUNTER — Ambulatory Visit: Payer: Managed Care, Other (non HMO) | Attending: Internal Medicine | Admitting: Occupational Therapy

## 2020-04-29 ENCOUNTER — Ambulatory Visit: Payer: Managed Care, Other (non HMO) | Admitting: Physical Therapy

## 2020-04-29 ENCOUNTER — Other Ambulatory Visit: Payer: Self-pay

## 2020-04-29 ENCOUNTER — Ambulatory Visit: Payer: Managed Care, Other (non HMO) | Admitting: Speech Pathology

## 2020-04-29 DIAGNOSIS — R41841 Cognitive communication deficit: Secondary | ICD-10-CM | POA: Diagnosis present

## 2020-04-29 DIAGNOSIS — R4701 Aphasia: Secondary | ICD-10-CM | POA: Insufficient documentation

## 2020-04-29 DIAGNOSIS — I69318 Other symptoms and signs involving cognitive functions following cerebral infarction: Secondary | ICD-10-CM | POA: Diagnosis not present

## 2020-04-29 DIAGNOSIS — R2681 Unsteadiness on feet: Secondary | ICD-10-CM | POA: Insufficient documentation

## 2020-04-29 DIAGNOSIS — R278 Other lack of coordination: Secondary | ICD-10-CM | POA: Insufficient documentation

## 2020-04-29 DIAGNOSIS — M6281 Muscle weakness (generalized): Secondary | ICD-10-CM | POA: Insufficient documentation

## 2020-04-29 DIAGNOSIS — R41842 Visuospatial deficit: Secondary | ICD-10-CM | POA: Diagnosis present

## 2020-04-29 DIAGNOSIS — R2689 Other abnormalities of gait and mobility: Secondary | ICD-10-CM | POA: Diagnosis present

## 2020-04-29 NOTE — Therapy (Signed)
[facility] [address] [place], [place], [address] Phone: [phone]   Fax:  [phone]  Speech Language Pathology Treatment  Patient Details  Name: Brick Ketcher MRN: [id number] Date of Birth: [birth year] Referring Provider (SLP): Bonnielee Haff (referring); Copland, Frederico Hamman (PCP/documentation)   Encounter Date: 04/29/2020   End of Session - 04/29/20 1603    Visit Number 13    Number of Visits 17    Date for SLP Re-Evaluation 05/02/20    SLP Start Time 7824    SLP Stop Time  1401    SLP Time Calculation (min) 44 min    Activity Tolerance Patient tolerated treatment well           Past Medical History:  Diagnosis Date  . Angio-edema   . Anxiety   . Depression   . Diabetes mellitus type II    type 2  . GERD (gastroesophageal reflux disease)   . Headache    migraine  . History of kidney stones   . HLD (hyperlipidemia)   . HTN (hypertension)   . Morbid obesity ([facility])   . Restless legs syndrome (RLS) 11/20/2012  . Stroke ([facility]) 02/14/2020   ischemic/right sided affected  . Tobacco abuse   . Ulcer of left ankle (HCC)    last 2 months dime size dry dressing changing q day  . Urticaria     Past Surgical History:  Procedure Laterality Date  . Arm surgery Right 1969   Abscess excision and debridement at elbow  . COLONOSCOPY WITH PROPOFOL N/A 09/06/2016   Procedure: COLONOSCOPY WITH PROPOFOL;  Surgeon: Doran Stabler, MD;  [facility];  Service: Gastroenterology;  Laterality: N/A;  . I & D EXTREMITY Left 05/29/2014   Procedure: IRRIGATION AND DEBRIDEMENT OF LEFT LEG WOUND AND PLACEMENT OF  INTEGRA  AND  VAC;  Surgeon: Theodoro Kos, DO;  Location: [facility];  Service: Plastics;  Laterality: Left;  . implantable loop recorder implant  03/06/2020   Medtronic Reveal [place] model LNQ11 (SN  [id number]) implanted by Dr Rayann Heman for cryptogenic stroke  . INCISION AND DRAINAGE OF WOUND  Left 04/23/2014   Procedure: IRRIGATION AND DEBRIDEMENT LOWER LEFT LEG WOUND WITH PLACEMENT OF INTEGRA AND VAC;  Surgeon: Theodoro Kos, DO;  Location: [facility];  Service: Plastics;  Laterality: Left;  . kidney stone removal  2004  . MINOR APPLICATION OF WOUND VAC Left 04/23/2014   Procedure: MINOR APPLICATION OF WOUND VAC;  Surgeon: Theodoro Kos, DO;  Location: [facility];  Service: Plastics;  Laterality: Left;  . SKIN SPLIT GRAFT Left 06/18/2014   Procedure: SKIN GRAFT SPLIT THICKNESS TO LOWER LEFT LEG WOUND WITH PLACEMENT OF VAC;  Surgeon: Theodoro Kos, DO;  Location: [facility];  Service: Plastics;  Laterality: Left;  Marland Kitchen VASECTOMY  1994    There were no vitals filed for this visit.          ADULT SLP TREATMENT - 04/29/20 1554      General Information   Behavior/Cognition Alert;Cooperative;Pleasant mood      Treatment Provided   Treatment provided Cognitive-Linquistic      Cognitive-Linquistic Treatment   Treatment focused on Cognition;Patient/family/caregiver education    Skilled Treatment Spouse, [name], present to clarify that [name] required assistance on Thanksgiving from her and daughter to prepare foods. She reports they provided verbal cues, gathered supplies and [name] did spill items at times. She endorses slower processing and attention impairments in the  kitchen. Concern re: not extending ST and OT beyond next few weeks. Discussed that we will re-assess after 17th visit and determine new goals as indicated. Educated pt and spouse that writing longer, more complex sentences may not directly improve reading comprehension. See pt instructions. Encouraged them to keep [name] participating in household functional activities. ID'd errors on HW, [name] will correct them for next session, and provided written HW generating 4 steps (written) for a task, to facilitate advance to paragraph writing       Assessment / Recommendations / [place] with current plan of care      Progression Toward Goals   Progression toward goals Progressing toward goals            SLP Education - 04/29/20 1600    Education Details functional acitivities to target memory, attention and processing; aquired alexia vs aphasia, cue pt for problem solving rather than instructing and correcting    Person(s) Educated Patient;Spouse    Methods Explanation;Verbal cues;Handout    Comprehension Verbal cues required;Verbalized understanding;Need further instruction            SLP Short Term Goals - 04/29/20 1603      SLP SHORT TERM GOAL #1   Title Patient will participate functionally in 8 minutes simple-mod complex conversation using compensations for anomia as necessary, x3 visits.    Baseline 03/31/20 04/02/20    Time 1    Period Weeks    Status Partially Met      SLP SHORT TERM GOAL #2   Title Pt will demonstrate auditory comprehension of 5 minutes simple-mod complex material by answering [place] questions >90% accuracy x 2 sessions.    Baseline 04/02/20    Time 1    Period Weeks    Status Partially Met      SLP SHORT TERM GOAL #3   Title Pt will participate in assessment of reading, writing and cognition with goals added PRN    Baseline writing assessed 03/12/20; allowing more time for assessment due to anxiety    Time 1    Period Weeks    Status Partially Met            SLP Long Term Goals - 04/29/20 1603      SLP LONG TERM GOAL #1   Title Patient will participate functionally in 15 minutes mod complex-complex conversation using compensations as necessary x 3 visits.    Baseline 04/28/20;    Time 2    Period Weeks    Status On-going      SLP LONG TERM GOAL #2   Title Patient will demonstrate auditory comprehension of 15 minutes mod complex content (conversation, podcast, radio program, etc) x 3 visits by answering detailed questions >90% accuracy.    Time 2    Period Weeks    Status On-going      SLP LONG TERM GOAL #3    Title Patient will report using accessibility features to read and reply to text messages x 3 visits.    Time 3    Period Weeks    Status Achieved      SLP LONG TERM GOAL #4   Title Pt will use compensations for processing mod complex written/verbal information with mod I x 2 sessions.    Time 3    Period Weeks    Status On-going      SLP LONG TERM GOAL #5   Title Pt will complete mod complex functional problem solving activities >  90% accuracy with extra time.    Baseline 11/39/21;    Time 4    Period Weeks    Status On-going            Plan - 04/29/20 1602    Clinical Impression Statement [name] continues with mild anomic aphasia, impaired reading comprehension and written expression, and overall mild cognitive deficits per CLQT, Error awareness/correction is a relative strength, however [name] has slow processing, impaired problem solving, memory, and visuospatial skills.  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 [name] remains motivated for progress, and works diligently outside of therapy on tasks assigned in session and therapy apps on his tablet. I recommend skilled ST to maximize communication and cognitive abilities to reduce frustration, improve language abilities, safety, and for possible return to work in some capacity    Speech Therapy Frequency 2x / week    Duration 8 weeks   17 visits   Treatment/Interventions Language facilitation;Environmental controls;Cueing hierarchy;SLP instruction and feedback;Cognitive reorganization;Compensatory techniques;Functional tasks;Compensatory strategies;Internal/external aids;Multimodal communcation approach;Patient/family education    Potential to Achieve Goals Good           Patient will benefit from skilled therapeutic intervention in order to improve the following deficits and impairments:   Aphasia  Cognitive communication deficit    Problem  List Patient Active Problem List   Diagnosis Date Noted  . Aortic calcification ([facility]) 02/22/2020  . Carotid artery calcification, bilateral 02/22/2020  . MDD (major depressive disorder), recurrent episode ([facility])   . GERD (gastroesophageal reflux disease)   . Ischemic stroke ([place]), occipital   . Diabetic polyneuropathy associated with type 2 diabetes mellitus ([facility]) 06/30/2018  . Erectile dysfunction due to type 2 diabetes mellitus ([facility]) 06/30/2018  . Circulation disorder of lower extremity 05/31/2015  . IBS (irritable bowel syndrome) 12/24/2014  . 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 [place]) 04/23/2014  . Diabetic leg ulcer ([place]) 07/18/2013  . Restless legs syndrome (RLS) 11/20/2012  . Generalized anxiety disorder   . Type 2 diabetes mellitus with vascular disease ([facility])   . Hyperlipidemia LDL goal <70 01/16/2009  . TOBACCO ABUSE 07/27/2007  . Morbid obesity ([facility]) 09/29/2006  . Essential hypertension 09/29/2006  . CALCULUS, KIDNEY 05/30/2002    Elliana Bal, Annye Rusk MS, CCC-SLP 04/29/2020, 4:04 PM  [facility] [address] [place], [place], [address] Phone: [phone]   Fax:  [phone]   Name: Fahed Morten MRN: [id number] Date of Birth: October 30, 1960

## 2020-04-29 NOTE — Patient Instructions (Signed)
   When doing memory tasks, keep it functional : things needed from the store, what we ordered at the restaurant, details of current events, etc  It is normal for [name] not to be able to read what he just wrote, especially as it gets longer  Neuroplasticity is supported by functional activities that are meaningful to the person  [place] job allowing [name] to process and verbalize steps and tools/ingredients required  [place] job using the app to help you read texts and articles  Be mindful of not interrupting or distracting him while he needs to concentrate  Keep reading comprehension level where [name] has success somewhat independently  Danil Wedge's cell [phone]  If you don't hear from Dr. Jerrell Mylar, let me know

## 2020-04-29 NOTE — Therapy (Signed)
[facility] [address] [place], [place], [address] Phone: [phone]   Fax:  [phone]  Occupational Therapy Treatment  Patient Details  Name: [name] [name] MRN: [id number] Date of Birth: 1960-07-07 Referring Provider (OT): Dr. Erlinda Hong   Encounter Date: 04/29/2020   OT End of Session - 04/29/20 1324    Visit Number 11    Number of Visits 16    Date for OT Re-Evaluation 05/17/20    Authorization Type Cigna/Cigna managed    Authorization Time Period 60 days    OT Start Time 1234    OT Stop Time 1317    OT Time Calculation (min) 43 min    Activity Tolerance Patient tolerated treatment well;Other (comment)           Past Medical History:  Diagnosis Date  . Angio-edema   . Anxiety   . Depression   . Diabetes mellitus type II    type 2  . GERD (gastroesophageal reflux disease)   . Headache    migraine  . History of kidney stones   . HLD (hyperlipidemia)   . HTN (hypertension)   . Morbid obesity ([place])   . Restless legs syndrome (RLS) 11/20/2012  . Stroke ([place]) 02/14/2020   ischemic/right sided affected  . Tobacco abuse   . Ulcer of left ankle ([facility])    last 2 months dime size dry dressing changing q day  . Urticaria     Past Surgical History:  Procedure Laterality Date  . Arm surgery Right 1969   Abscess excision and debridement at elbow  . COLONOSCOPY WITH PROPOFOL N/A 09/06/2016   Procedure: COLONOSCOPY WITH PROPOFOL;  Surgeon: Doran Stabler, MD;  [facility]: [facility];  Service: Gastroenterology;  Laterality: N/A;  . I & D EXTREMITY Left 05/29/2014   Procedure: IRRIGATION AND DEBRIDEMENT OF LEFT LEG WOUND AND PLACEMENT OF  INTEGRA  AND  VAC;  Surgeon: Theodoro Kos, DO;  Location: [facility];  Service: Plastics;  Laterality: Left;  . implantable loop recorder implant  03/06/2020   Medtronic Reveal [place] model LNQ11 (SN  [id number]) implanted by Dr Rayann Heman for cryptogenic stroke  .  INCISION AND DRAINAGE OF WOUND Left 04/23/2014   Procedure: IRRIGATION AND DEBRIDEMENT LOWER LEFT LEG WOUND WITH PLACEMENT OF INTEGRA AND VAC;  Surgeon: Theodoro Kos, DO;  Location: [facility];  Service: Plastics;  Laterality: Left;  . kidney stone removal  2004  . MINOR APPLICATION OF WOUND VAC Left 04/23/2014   Procedure: MINOR APPLICATION OF WOUND VAC;  Surgeon: Theodoro Kos, DO;  Location: [facility];  Service: Plastics;  Laterality: Left;  . SKIN SPLIT GRAFT Left 06/18/2014   Procedure: SKIN GRAFT SPLIT THICKNESS TO LOWER LEFT LEG WOUND WITH PLACEMENT OF VAC;  Surgeon: Theodoro Kos, DO;  Location: [facility];  Service: Plastics;  Laterality: Left;  Marland Kitchen VASECTOMY  1994    There were no vitals filed for this visit.   Subjective Assessment - 04/29/20 1323    Subjective  Wife accompanied patient today to clarify assist needed for cooking and give more accurate depiction of pt's difficulties/struggles    Patient is accompanied by: Family member   wife   Pertinent History CVA 02/14/20 Lt occipital lobe. PMH: 2 small previous strokes, DM, GERD, HLD, HTN (but now has issues w/ orthostasis)    Limitations no driving, loop recorder    Currently in Pain? No/denies  Wife accompanied patient today to give more accurate depiction of pt's level with cooking - wife reports pt required set up and assist w/ gathering ingredients, for sequencing, and planning ahead, as well as just organization and dividing attention b/t items (for Thanksgiving meal last week). Pt also had several spills d/t visual deficits and decreased attention to task.  Discussed strategies: including focusing on just cooking 1 item (multiple steps but more familiar item or simple recipe to follow directions)  Wife also expresses concern with his ability to use the computer d/t visual deficits, aphasia, decreased processing and agrees that pt will most likely not be able to return  to work related tasks successfully. She said that he had difficulty also attaching parts to his new electric razor (novel task) and therapist gave suggestions that may help with this including following visual directions found in lego kits and origami.  Discussed plan for following session and probable renewal mid December.                        OT Short Term Goals - 04/15/20 1025      OT SHORT TERM GOAL #1   Title Pt will be independent with HEP for Rt hand coordination and Rt shoulder (low range strengthening)    Time 4    Period Weeks    Status Achieved   MET for coordination     OT SHORT TERM GOAL #2   Title Pt will be independent with visual scanning strategies to compensate for Rt visual field cut    Time 4    Period Weeks    Status Achieved      OT SHORT TERM GOAL #3   Title Improve coordination Rt hand to 28 sec or less for high level tasks    Baseline 33.91 sec    Time 4    Period Weeks    Status On-going   31.65 sec     OT SHORT TERM GOAL #4   Title Pt will verbalize understanding with memory compensatory strategies and utilize for medication and financial management    Time 4    Period Weeks    Status Achieved      OT SHORT TERM GOAL #5   Title Pt will perform simple financial management tasks using calculator prn with supervision and no more than min v.c's    Time 4    Period Weeks    Status Achieved   min cues to copy numbers correctly from calculator to paper     OT SHORT TERM GOAL #6   Title Pt will perform simple stovetop task with supervision only    Time 4    Period Weeks    Status Partially Met   per pt report (assist only to take something off stove or out of oven for safety d/t vision, supervision otherwise)     OT SHORT TERM GOAL #7   Title Pt will perform tabletop scanning with 100% accuracy and environmental scanning with 75% or greater accuracy    Time 4    Period Weeks    Status Partially Met   met w/ environmental scanning  (92%), but inconsistent with tabletop scanning            OT Long Term Goals - 04/21/20 1650      OT LONG TERM GOAL #1   Title Pt to return to cooking with distant supervision    Time 8    Period Weeks  Status New      OT LONG TERM GOAL #2   Title Pt will complete meal plan for the week and develop corresponding grocery list I'ly    Time 8    Period Weeks    Status New      OT LONG TERM GOAL #3   Title Pt will perform full financial management tasks at 100% accuracy with no cues    Time 8    Period Weeks    Status New      OT LONG TERM GOAL #4   Title Pt will demo organizational and problem solving skills to complete and adapt a schedule prn    Time 8    Period Weeks    Status New      OT LONG TERM GOAL #5   Title Pt will perform environmental scanning with 90% or greater accuracy    Time 8    Period Weeks    Status New                 Plan - 04/29/20 1325    Clinical Impression Statement Pt limited in cooking d/t deficits in executive functioning (sequencing, planning ahead, organization, and problem solving) and requires set up and cues to perform safely and efficiently. Pt also w/ visual deficits impacting safety w/ cooking    OT Occupational Profile and History Detailed Assessment- Review of Records and additional review of physical, cognitive, psychosocial history related to current functional performance    Occupational performance deficits (Please refer to evaluation for details): ADL's;IADL's;Work;Leisure    Body Structure / Function / Physical Skills ADL;Strength;Balance;UE functional use;IADL;Vision;Coordination;Mobility;Sensation;FMC    Cognitive Skills Memory;Perception;Problem Solve;Sequencing    Rehab Potential Good    Clinical Decision Making Several treatment options, min-mod task modification necessary    Comorbidities Affecting Occupational Performance: May have comorbidities impacting occupational performance    Modification or Assistance  to Complete Evaluation  Min-Moderate modification of tasks or assist with assess necessary to complete eval    OT Frequency 2x / week    OT Duration 8 weeks   plus eval   OT Treatment/Interventions Self-care/ADL training;DME and/or AE instruction;Therapeutic activities;Therapeutic exercise;Cognitive remediation/compensation;Coping strategies training;Functional Mobility Training;Neuromuscular education;Visual/perceptual remediation/compensation;Patient/family education    Plan Pt/wife to bring in ingredients to follow simple recipe (boxed mac- n-cheese or brownie mix)    Consulted and Agree with Plan of Care Patient;Family member/caregiver    Family Member Consulted wife           Patient will benefit from skilled therapeutic intervention in order to improve the following deficits and impairments:   Body Structure / Function / Physical Skills: ADL, Strength, Balance, UE functional use, IADL, Vision, Coordination, Mobility, Sensation, [facility] Cognitive Skills: Memory, Perception, Problem Solve, Sequencing     Visit Diagnosis: Other symptoms and signs involving cognitive functions following cerebral infarction  Visuospatial deficit    Problem List Patient Active Problem List   Diagnosis Date Noted  . Aortic calcification ([place]) 02/22/2020  . Carotid artery calcification, bilateral 02/22/2020  . MDD (major depressive disorder), recurrent episode ([place])   . GERD (gastroesophageal reflux disease)   . Ischemic stroke ([place]), occipital   . Diabetic polyneuropathy associated with type 2 diabetes mellitus ([place]) 06/30/2018  . Erectile dysfunction due to type 2 diabetes mellitus ([place]) 06/30/2018  . Circulation disorder of lower extremity 05/31/2015  . IBS (irritable bowel syndrome) 12/24/2014  . 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 Diabetic leg ulcer ([place])  07/18/2013  . Restless legs syndrome (RLS) 11/20/2012  . Generalized anxiety disorder   . Type 2 diabetes  mellitus with vascular disease ([place])   . Hyperlipidemia LDL goal <70 01/16/2009  . TOBACCO ABUSE 07/27/2007  . Morbid obesity ([place]) 09/29/2006  . Essential hypertension 09/29/2006  . CALCULUS, KIDNEY 05/30/2002    Carey Bullocks, OTR/L 04/29/2020, 1:27 PM  [facility] [address] [place], [place], [address] Phone: [phone]   Fax:  [phone]  Name: [name] [name] MRN: [id number] Date of Birth: 1960-12-03

## 2020-05-05 ENCOUNTER — Ambulatory Visit: Payer: Managed Care, Other (non HMO) | Admitting: Physical Therapy

## 2020-05-05 ENCOUNTER — Other Ambulatory Visit: Payer: Self-pay

## 2020-05-05 ENCOUNTER — Ambulatory Visit: Payer: Managed Care, Other (non HMO) | Admitting: Occupational Therapy

## 2020-05-05 DIAGNOSIS — I69318 Other symptoms and signs involving cognitive functions following cerebral infarction: Secondary | ICD-10-CM | POA: Diagnosis not present

## 2020-05-05 DIAGNOSIS — R41842 Visuospatial deficit: Secondary | ICD-10-CM

## 2020-05-05 DIAGNOSIS — R278 Other lack of coordination: Secondary | ICD-10-CM

## 2020-05-05 NOTE — Therapy (Signed)
[facility] [address] [place], [place], [address] Phone: [phone]   Fax:  [phone]  Occupational Therapy Treatment  Patient Details  Name: [name] [name] MRN: [id number] Date of Birth: Sep 25, 1960 Referring Provider (OT): Dr. Erlinda Hong   Encounter Date: 05/05/2020   OT End of Session - 05/05/20 1152    Visit Number 12    Number of Visits 16    Date for OT Re-Evaluation 05/17/20    Authorization Type Cigna/Cigna managed    Authorization Time Period 60 days    OT Start Time 1100    OT Stop Time 1140    OT Time Calculation (min) 40 min    Activity Tolerance Patient tolerated treatment well;Other (comment)           Past Medical History:  Diagnosis Date  . Angio-edema   . Anxiety   . Depression   . Diabetes mellitus type II    type 2  . GERD (gastroesophageal reflux disease)   . Headache    migraine  . History of kidney stones   . HLD (hyperlipidemia)   . HTN (hypertension)   . Morbid obesity ([place])   . Restless legs syndrome (RLS) 11/20/2012  . Stroke ([place]) 02/14/2020   ischemic/right sided affected  . Tobacco abuse   . Ulcer of left ankle (HCC)    last 2 months dime size dry dressing changing q day  . Urticaria     Past Surgical History:  Procedure Laterality Date  . Arm surgery Right 1969   Abscess excision and debridement at elbow  . COLONOSCOPY WITH PROPOFOL N/A 09/06/2016   Procedure: COLONOSCOPY WITH PROPOFOL;  Surgeon: Doran Stabler, MD;  [facility];  Service: Gastroenterology;  Laterality: N/A;  . I & D EXTREMITY Left 05/29/2014   Procedure: IRRIGATION AND DEBRIDEMENT OF LEFT LEG WOUND AND PLACEMENT OF  INTEGRA  AND  VAC;  Surgeon: Theodoro Kos, DO;  Location: [facility];  Service: Plastics;  Laterality: Left;  . implantable loop recorder implant  03/06/2020   Medtronic Reveal [place] model LNQ11 (SN  [id number]) implanted by Dr Rayann Heman for cryptogenic stroke  .  INCISION AND DRAINAGE OF WOUND Left 04/23/2014   Procedure: IRRIGATION AND DEBRIDEMENT LOWER LEFT LEG WOUND WITH PLACEMENT OF INTEGRA AND VAC;  Surgeon: Theodoro Kos, DO;  Location: [facility];  Service: Plastics;  Laterality: Left;  . kidney stone removal  2004  . MINOR APPLICATION OF WOUND VAC Left 04/23/2014   Procedure: MINOR APPLICATION OF WOUND VAC;  Surgeon: Theodoro Kos, DO;  Location: [facility];  Service: Plastics;  Laterality: Left;  . SKIN SPLIT GRAFT Left 06/18/2014   Procedure: SKIN GRAFT SPLIT THICKNESS TO LOWER LEFT LEG WOUND WITH PLACEMENT OF VAC;  Surgeon: Theodoro Kos, DO;  Location: [facility];  Service: Plastics;  Laterality: Left;  Marland Kitchen VASECTOMY  1994    There were no vitals filed for this visit.   Subjective Assessment - 05/05/20 1152    Subjective  I was so nervous to cook for you today    Patient is accompanied by: Family member   wife   Pertinent History CVA 02/14/20 Lt occipital lobe. PMH: 2 small previous strokes, DM, GERD, HLD, HTN (but now has issues w/ orthostasis)    Limitations no driving, loop recorder           Pt brought in ingredients to cook mac-n-cheese. Pt required min to mod questioning cues/prompts  for planning ahead and sequencing (in anticipation of next step) and 1 correction cue for using regular sized spoon to stir noodles over stove - pt given longer handled mixing spoon to use for safety. Pt also required cue to turn down stove from high to medium (once boiling) and turn fan on. He independently turned stove off and washed dishes once done. Pt did have trouble reading smaller print instructions and therefore therapist read aloud to him. Pt had one drop of mixing utensil.  However, pt was able to adjust ingredients for better outcome, and overall did well with supervision/min cues                       OT Short Term Goals - 04/15/20 1025      OT SHORT TERM GOAL #1   Title Pt  will be independent with HEP for Rt hand coordination and Rt shoulder (low range strengthening)    Time 4    Period Weeks    Status Achieved   MET for coordination     OT SHORT TERM GOAL #2   Title Pt will be independent with visual scanning strategies to compensate for Rt visual field cut    Time 4    Period Weeks    Status Achieved      OT SHORT TERM GOAL #3   Title Improve coordination Rt hand to 28 sec or less for high level tasks    Baseline 33.91 sec    Time 4    Period Weeks    Status On-going   31.65 sec     OT SHORT TERM GOAL #4   Title Pt will verbalize understanding with memory compensatory strategies and utilize for medication and financial management    Time 4    Period Weeks    Status Achieved      OT SHORT TERM GOAL #5   Title Pt will perform simple financial management tasks using calculator prn with supervision and no more than min v.c's    Time 4    Period Weeks    Status Achieved   min cues to copy numbers correctly from calculator to paper     OT SHORT TERM GOAL #6   Title Pt will perform simple stovetop task with supervision only    Time 4    Period Weeks    Status Partially Met   per pt report (assist only to take something off stove or out of oven for safety d/t vision, supervision otherwise)     OT SHORT TERM GOAL #7   Title Pt will perform tabletop scanning with 100% accuracy and environmental scanning with 75% or greater accuracy    Time 4    Period Weeks    Status Partially Met   met w/ environmental scanning (92%), but inconsistent with tabletop scanning            OT Long Term Goals - 04/21/20 1650      OT LONG TERM GOAL #1   Title Pt to return to cooking with distant supervision    Time 8    Period Weeks    Status New      OT LONG TERM GOAL #2   Title Pt will complete meal plan for the week and develop corresponding grocery list I'ly    Time 8    Period Weeks    Status New      OT LONG TERM GOAL #3   Title Pt  will perform  full financial management tasks at 100% accuracy with no cues    Time 8    Period Weeks    Status New      OT LONG TERM GOAL #4   Title Pt will demo organizational and problem solving skills to complete and adapt a schedule prn    Time 8    Period Weeks    Status New      OT LONG TERM GOAL #5   Title Pt will perform environmental scanning with 90% or greater accuracy    Time 8    Period Weeks    Status New                 Plan - 05/05/20 1155    Clinical Impression Statement Pt limited in cooking d/t deficits in executive functioning (sequencing, planning ahead, organization, and problem solving) and requires set up and cues to perform safely and efficiently. Pt also w/ visual deficits impacting safety w/ cooking    OT Occupational Profile and History Detailed Assessment- Review of Records and additional review of physical, cognitive, psychosocial history related to current functional performance    Occupational performance deficits (Please refer to evaluation for details): ADL's;IADL's;Work;Leisure    Body Structure / Function / Physical Skills ADL;Strength;Balance;UE functional use;IADL;Vision;Coordination;Mobility;Sensation;FMC    Cognitive Skills Memory;Perception;Problem Solve;Sequencing    Rehab Potential Good    Clinical Decision Making Several treatment options, min-mod task modification necessary    Comorbidities Affecting Occupational Performance: May have comorbidities impacting occupational performance    Modification or Assistance to Complete Evaluation  Min-Moderate modification of tasks or assist with assess necessary to complete eval    OT Frequency 2x / week    OT Duration 8 weeks   plus eval   OT Treatment/Interventions Self-care/ADL training;DME and/or AE instruction;Therapeutic activities;Therapeutic exercise;Cognitive remediation/compensation;Coping strategies training;Functional Mobility Training;Neuromuscular education;Visual/perceptual  remediation/compensation;Patient/family education    Plan continue simple cooking tasks (brownie or muffin mix)    Consulted and Agree with Plan of Care Patient;Family member/caregiver    Family Member Consulted wife           Patient will benefit from skilled therapeutic intervention in order to improve the following deficits and impairments:   Body Structure / Function / Physical Skills: ADL, Strength, Balance, UE functional use, IADL, Vision, Coordination, Mobility, Sensation, [facility] Cognitive Skills: Memory, Perception, Problem Solve, Sequencing     Visit Diagnosis: Other symptoms and signs involving cognitive functions following cerebral infarction  Visuospatial deficit  Other lack of coordination    Problem List Patient Active Problem List   Diagnosis Date Noted  . Aortic calcification ([place]) 02/22/2020  . Carotid artery calcification, bilateral 02/22/2020  . MDD (major depressive disorder), recurrent episode ([place])   . GERD (gastroesophageal reflux disease)   . Ischemic stroke ([place]), occipital   . Diabetic polyneuropathy associated with type 2 diabetes mellitus ([place]) 06/30/2018  . Erectile dysfunction due to type 2 diabetes mellitus ([place]) 06/30/2018  . Circulation disorder of lower extremity 05/31/2015  . IBS (irritable bowel syndrome) 12/24/2014  . 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 Diabetic leg ulcer ([place]) 07/18/2013  . Restless legs syndrome (RLS) 11/20/2012  . Generalized anxiety disorder   . Type 2 diabetes mellitus with vascular disease ([place])   . Hyperlipidemia LDL goal <70 01/16/2009  . TOBACCO ABUSE 07/27/2007  . Morbid obesity ([place]) 09/29/2006  . Essential hypertension 09/29/2006  . CALCULUS, KIDNEY 05/30/2002    Carey Bullocks,  OTR/L 05/05/2020, 11:57 AM  [facility] [address] [place], [place], [address] Phone: [phone]   Fax:   [phone]  Name: [name] [name] MRN: [id number] Date of Birth: 1960-09-05

## 2020-05-07 ENCOUNTER — Ambulatory Visit: Payer: Managed Care, Other (non HMO) | Admitting: Physical Therapy

## 2020-05-07 ENCOUNTER — Ambulatory Visit: Payer: Managed Care, Other (non HMO) | Admitting: Occupational Therapy

## 2020-05-07 ENCOUNTER — Ambulatory Visit (INDEPENDENT_AMBULATORY_CARE_PROVIDER_SITE_OTHER): Payer: Managed Care, Other (non HMO)

## 2020-05-07 ENCOUNTER — Ambulatory Visit: Payer: Managed Care, Other (non HMO)

## 2020-05-07 ENCOUNTER — Other Ambulatory Visit: Payer: Self-pay

## 2020-05-07 DIAGNOSIS — R2689 Other abnormalities of gait and mobility: Secondary | ICD-10-CM

## 2020-05-07 DIAGNOSIS — I69318 Other symptoms and signs involving cognitive functions following cerebral infarction: Secondary | ICD-10-CM | POA: Diagnosis not present

## 2020-05-07 DIAGNOSIS — R2681 Unsteadiness on feet: Secondary | ICD-10-CM

## 2020-05-07 DIAGNOSIS — I639 Cerebral infarction, unspecified: Secondary | ICD-10-CM

## 2020-05-07 DIAGNOSIS — R278 Other lack of coordination: Secondary | ICD-10-CM

## 2020-05-07 DIAGNOSIS — M6281 Muscle weakness (generalized): Secondary | ICD-10-CM

## 2020-05-07 NOTE — Therapy (Signed)
[facility] [address] [place], [place], [address] Phone: [phone]   Fax:  [phone]  Physical Therapy Treatment  Patient Details  Name: [name] LauraHerbert [name] MRN: [id number] Date of Birth: Nov 14, 1960 Referring Provider (PT): Dr. Dallas Schimke[place]eland   Encounter Date: 05/07/2020   PT End of Session - 05/07/20 1510    Visit Number 10    Number of Visits 17    Date for PT Re-Evaluation 06/16/20    Authorization Type [employer] Managed    Progress Note Due on Visit 9    PT Start Time 1530    PT Stop Time 1615    PT Time Calculation (min) 45 min    Equipment Utilized During Treatment Gait belt    Activity Tolerance Patient tolerated treatment well    Behavior During Therapy [facility] for tasks assessed/performed           Past Medical History:  Diagnosis Date  . Angio-edema   . Anxiety   . Depression   . Diabetes mellitus type II    type 2  . GERD (gastroesophageal reflux disease)   . Headache    migraine  . History of kidney stones   . HLD (hyperlipidemia)   . HTN (hypertension)   . Morbid obesity (HCC)   . Restless legs syndrome (RLS) 11/20/2012  . Stroke (HCC) 02/14/2020   ischemic/right sided affected  . Tobacco abuse   . Ulcer of left ankle (HCC)    last 2 months dime size dry dressing changing q day  . Urticaria     Past Surgical History:  Procedure Laterality Date  . Arm surgery Right 1969   Abscess excision and debridement at elbow  . COLONOSCOPY WITH PROPOFOL N/A 09/06/2016   Procedure: COLONOSCOPY WITH PROPOFOL;  Surgeon: Sherrilyn RistHenry L Danis III, MD;  Location: [facility];  Service: Gastroenterology;  Laterality: N/A;  . I & D EXTREMITY Left 05/29/2014   Procedure: IRRIGATION AND DEBRIDEMENT OF LEFT LEG WOUND AND PLACEMENT OF  INTEGRA  AND  VAC;  Surgeon: Wayland Denislaire Sanger, DO;  Location: [facility];  Service: Plastics;  Laterality: Left;  . implantable loop recorder implant  03/06/2020   Medtronic Reveal  [place] model LNQ11 (SN  [id number]) implanted by Dr Johney FrameAllred for cryptogenic stroke  . INCISION AND DRAINAGE OF WOUND Left 04/23/2014   Procedure: IRRIGATION AND DEBRIDEMENT LOWER LEFT LEG WOUND WITH PLACEMENT OF INTEGRA AND VAC;  Surgeon: Wayland Denislaire Sanger, DO;  Location: [facility];  Service: Plastics;  Laterality: Left;  . kidney stone removal  2004  . MINOR APPLICATION OF WOUND VAC Left 04/23/2014   Procedure: MINOR APPLICATION OF WOUND VAC;  Surgeon: Wayland Denislaire Sanger, DO;  Location: [facility];  Service: Plastics;  Laterality: Left;  . SKIN SPLIT GRAFT Left 06/18/2014   Procedure: SKIN GRAFT SPLIT THICKNESS TO LOWER LEFT LEG WOUND WITH PLACEMENT OF VAC;  Surgeon: Wayland Denislaire Sanger, DO;  Location: [facility];  Service: Plastics;  Laterality: Left;  Marland Kitchen. VASECTOMY  1994    There were no vitals filed for this visit.   Subjective Assessment - 05/07/20 1534    Subjective Pt states he has been doing okay. He states he has been doing the exercises. He is requesting a printout of the hip stretches. Pt is concerned because his FMLA is running out.    Pertinent History hypertension, hyperlipidemia, type 2 diabetes mellitus, restless leg syndrome, diabetic neuropathy, morbid obesity, tobacco abuse, depression/anxiety, GERD, chronic venous stasis ulcer  in bilateral lower extremities    Limitations Reading;Walking;House hold activities    How long can you sit comfortably? no issues    How long can you stand comfortably? 10 min    How long can you walk comfortably? 5 min    Diagnostic tests CT head showed Hyperdense right MCA.  CTA head and neck and perfusion study showed 10 cc region of completed infarction in left occipital cortex and additional 11 cc at risk/penumbra. [name] [name] (wife) reports that short term memory is affected.    Patient Stated Goals improve walking    Currently in Pain? No/denies             Ambulating in hallway 115'x5 no a/d CGA for 6 minutes total;  intervals of fast and slow. 2 sitting rest breaks ~2 minutes.  Standing  Hip flexor stretch x 20 sec  Hamstring stretch x 20 sec  Hip circles CW & CCW x 5 reps  Wall squat x5 -- limited due to pt with too much R knee pain  Terminal knee extension x20 bilat with red tband  Balance standing on 2 pillows feet apart:  EO x30 sec  EC x30 sec  EC with head turns x10  EC with head nods x10                       PT Education - 05/07/20 1825    Education Details Discussed using interval training to improve on his endurance as well. Reviewed and discussed hip stretches to reduce his hip pain while ambulating    Person(s) Educated Patient    Methods Explanation;Handout;Verbal cues    Comprehension Verbalized understanding            PT Short Term Goals - 03/25/20 1131      PT SHORT TERM GOAL #1   Title patient will be able to walk with or without AD for 10 min to improve walking endurance    Baseline <5 min without AD, 12 minutes with SPC    Time 4    Period Weeks    Status Achieved    Target Date 03/24/20             PT Long Term Goals - 04/21/20 1240      PT LONG TERM GOAL #1   Title Pt will be able to ambulate for 30 min with or without AD to improve walking endurance    Baseline <5 min without AD; able to amb 30 min with rest breaks (04/21/20)    Time 8    Period Weeks    Status Revised    Target Date 06/16/20      PT LONG TERM GOAL #2   Title Pt will demo 12 sec or less with 5x/ sit to stand without HHA to improve functional strength    Baseline 15 sec without HHA; 13.41 sec (04/21/20)    Time 8    Period Weeks    Status Revised    Target Date 06/16/20      PT LONG TERM GOAL #3   Title Pt will increase DGI score to at least 20/24 to demo decreased fall risk    Baseline 17/24 on 04/21/20    Time 8    Period Weeks    Status New    Target Date 06/16/20      PT LONG TERM GOAL #4   Title Pt and family would like pt to be able to squat at least  25# to demo improved bilat LE and trunk strength    Time 8    Period Weeks    Status New    Target Date 06/16/20      PT LONG TERM GOAL #5   Title Pt would like to be able to ambulate 6 min at least 581 ft without a/d to demo improved endurance and community mobility    Baseline 481' on 6 MWT with use of SPC (04/21/20)    Time 8    Period Weeks    Status New    Target Date 06/16/20                 Plan - 05/07/20 1822    Clinical Impression Statement Treatment focused on increasing pt's endurance without use of a/d utilizing interval training for fast/slow. Pt required reinforcement of hip stretches to decrease his hip pain while ambulating. Pt with a goal to be able to walk from his house to his daughter's house in the neighborhood. Continued to work on progressing pt's balance on more compliant surface with weight shifting.    Personal Factors and Comorbidities Comorbidity 3+;Time since onset of injury/illness/exacerbation    Comorbidities hypertension, hyperlipidemia, type 2 diabetes mellitus, restless leg syndrome, diabetic neuropathy, morbid obesity, tobacco abuse, depression/anxiety, GERD, chronic venous stasis ulcer in bilateral lower extremities    Examination-Activity Limitations Squat;Stand;Stairs    Examination-Participation Restrictions Community Activity;Yard Work;Shop    Rehab Potential Good    PT Frequency 1x / week    PT Duration 8 weeks    PT Treatment/Interventions ADLs/Self Care Home Management;Gait training;Stair training;Functional mobility training;Therapeutic activities;Therapeutic exercise;Balance training;Neuromuscular re-education;Patient/family education;Manual techniques;Passive range of motion;Energy conservation;Joint Manipulations    PT Next Visit Plan How are upper body exercises? Progress to green tband? Walking outside for endurance. Continue exercises for BLE strengthening (consider increasing quad and hamstring strengthening). Consider ambulating  on compliant surfaces and different levels.    PT Home Exercise Plan Walking program: walking with family member for 5 min and gradually progressing it to 30 min    Consulted and Agree with Plan of Care Patient           Patient will benefit from skilled therapeutic intervention in order to improve the following deficits and impairments:  Abnormal gait,Decreased activity tolerance,Decreased mobility,Decreased endurance,Difficulty walking,Impaired sensation,Impaired vision/preception,Postural dysfunction  Visit Diagnosis: Muscle weakness (generalized)  Unsteadiness on feet  Other abnormalities of gait and mobility     Problem List Patient Active Problem List   Diagnosis Date Noted  . Aortic calcification (HCC) 02/22/2020  . Carotid artery calcification, bilateral 02/22/2020  . MDD (major depressive disorder), recurrent episode (HCC)   . GERD (gastroesophageal reflux disease)   . Ischemic stroke (HCC), occipital   . Diabetic polyneuropathy associated with type 2 diabetes mellitus (HCC) 06/30/2018  . Erectile dysfunction due to type 2 diabetes mellitus (HCC) 06/30/2018  . Circulation disorder of lower extremity 05/31/2015  . IBS (irritable bowel syndrome) 12/24/2014  . 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 Diabetic leg ulcer (HCC) 07/18/2013  . Restless legs syndrome (RLS) 11/20/2012  . Generalized anxiety disorder   . Type 2 diabetes mellitus with vascular disease (HCC)   . Hyperlipidemia LDL goal <70 01/16/2009  . TOBACCO ABUSE 07/27/2007  . Morbid obesity (HCC) 09/29/2006  . Essential hypertension 09/29/2006  . CALCULUS, KIDNEY 05/30/2002    [name] [name] [name] [name] PT, DPT 05/07/2020, 6:27 PM  [facility] [phone]  [address] [place], [place], [address] Phone: [phone]   Fax:  [phone]  Name: [name] [name] MRN: [id number] Date of Birth: 20-Apr-1961

## 2020-05-07 NOTE — Therapy (Signed)
[facility] [address] [place], [place], [address] Phone: [phone]   Fax:  [phone]  Occupational Therapy Treatment  Patient Details  Name: [name] [name] MRN: [id number] Date of Birth: 1960/09/27 Referring Provider (OT): Dr. Erlinda Hong   Encounter Date: 05/07/2020   OT End of Session - 05/07/20 1511    Visit Number 13    Number of Visits 16    Date for OT Re-Evaluation 05/17/20    Authorization Type Cigna/Cigna managed    Authorization Time Period 60 days    OT Start Time 1445    OT Stop Time 1530    OT Time Calculation (min) 45 min    Activity Tolerance Patient tolerated treatment well;Other (comment)           Past Medical History:  Diagnosis Date  . Angio-edema   . Anxiety   . Depression   . Diabetes mellitus type II    type 2  . GERD (gastroesophageal reflux disease)   . Headache    migraine  . History of kidney stones   . HLD (hyperlipidemia)   . HTN (hypertension)   . Morbid obesity ([place])   . Restless legs syndrome (RLS) 11/20/2012  . Stroke ([place]) 02/14/2020   ischemic/right sided affected  . Tobacco abuse   . Ulcer of left ankle (HCC)    last 2 months dime size dry dressing changing q day  . Urticaria     Past Surgical History:  Procedure Laterality Date  . Arm surgery Right 1969   Abscess excision and debridement at elbow  . COLONOSCOPY WITH PROPOFOL N/A 09/06/2016   Procedure: COLONOSCOPY WITH PROPOFOL;  Surgeon: Doran Stabler, MD;  Location: [facility];  Service: Gastroenterology;  Laterality: N/A;  . I & D EXTREMITY Left 05/29/2014   Procedure: IRRIGATION AND DEBRIDEMENT OF LEFT LEG WOUND AND PLACEMENT OF  INTEGRA  AND  VAC;  Surgeon: Theodoro Kos, DO;  Location: [facility];  Service: Plastics;  Laterality: Left;  . implantable loop recorder implant  03/06/2020   Medtronic Reveal [place] model LNQ11 (SN  [id number]) implanted by Dr Rayann Heman for cryptogenic stroke  .  INCISION AND DRAINAGE OF WOUND Left 04/23/2014   Procedure: IRRIGATION AND DEBRIDEMENT LOWER LEFT LEG WOUND WITH PLACEMENT OF INTEGRA AND VAC;  Surgeon: Theodoro Kos, DO;  Location: [facility];  Service: Plastics;  Laterality: Left;  . kidney stone removal  2004  . MINOR APPLICATION OF WOUND VAC Left 04/23/2014   Procedure: MINOR APPLICATION OF WOUND VAC;  Surgeon: Theodoro Kos, DO;  Location: [facility];  Service: Plastics;  Laterality: Left;  . SKIN SPLIT GRAFT Left 06/18/2014   Procedure: SKIN GRAFT SPLIT THICKNESS TO LOWER LEFT LEG WOUND WITH PLACEMENT OF VAC;  Surgeon: Theodoro Kos, DO;  Location: [facility];  Service: Plastics;  Laterality: Left;  Marland Kitchen VASECTOMY  1994    There were no vitals filed for this visit.   Subjective Assessment - 05/07/20 1511    Subjective     Patient is accompanied by: Family member   wife   Pertinent History CVA 02/14/20 Lt occipital lobe. PMH: 2 small previous strokes, DM, GERD, HLD, HTN (but now has issues w/ orthostasis)    Limitations no driving, loop recorder    Currently in Pain? No/denies           Pt asked to cook muffins (only muffin mix and milk required) and 3 easy steps to  follow - pt required assist in reading instructions and reminder of instructions later in task. Pt also needed assist in preheating unfamiliar oven and setting timer. Pt overall did w/ supervision and min to mod cues, no physical assist other than taking out of oven due to safety w/ decreased sensation and coordination Rt hand.   Pt copying small peg design 100% accurately with min difficulty Rt hand due to coordination.   Discussed plan going forward and anticipated renewal in 1-2 weeks for another 4 weeks.                        OT Short Term Goals - 04/15/20 1025      OT SHORT TERM GOAL #1   Title Pt will be independent with HEP for Rt hand coordination and Rt shoulder (low range strengthening)    Time 4     Period Weeks    Status Achieved   MET for coordination     OT SHORT TERM GOAL #2   Title Pt will be independent with visual scanning strategies to compensate for Rt visual field cut    Time 4    Period Weeks    Status Achieved      OT SHORT TERM GOAL #3   Title Improve coordination Rt hand to 28 sec or less for high level tasks    Baseline 33.91 sec    Time 4    Period Weeks    Status On-going   31.65 sec     OT SHORT TERM GOAL #4   Title Pt will verbalize understanding with memory compensatory strategies and utilize for medication and financial management    Time 4    Period Weeks    Status Achieved      OT SHORT TERM GOAL #5   Title Pt will perform simple financial management tasks using calculator prn with supervision and no more than min v.c's    Time 4    Period Weeks    Status Achieved   min cues to copy numbers correctly from calculator to paper     OT SHORT TERM GOAL #6   Title Pt will perform simple stovetop task with supervision only    Time 4    Period Weeks    Status Partially Met   per pt report (assist only to take something off stove or out of oven for safety d/t vision, supervision otherwise)     OT SHORT TERM GOAL #7   Title Pt will perform tabletop scanning with 100% accuracy and environmental scanning with 75% or greater accuracy    Time 4    Period Weeks    Status Partially Met   met w/ environmental scanning (92%), but inconsistent with tabletop scanning            OT Long Term Goals - 04/21/20 1650      OT LONG TERM GOAL #1   Title Pt to return to cooking with distant supervision    Time 8    Period Weeks    Status New      OT LONG TERM GOAL #2   Title Pt will complete meal plan for the week and develop corresponding grocery list I'ly    Time 8    Period Weeks    Status New      OT LONG TERM GOAL #3   Title Pt will perform full financial management tasks at 100% accuracy with no cues  Time 8    Period Weeks    Status New       OT LONG TERM GOAL #4   Title Pt will demo organizational and problem solving skills to complete and adapt a schedule prn    Time 8    Period Weeks    Status New      OT LONG TERM GOAL #5   Title Pt will perform environmental scanning with 90% or greater accuracy    Time 8    Period Weeks    Status New                 Plan - 05/07/20 1513    Clinical Impression Statement Pt limited in cooking d/t deficits in executive functioning (sequencing, planning ahead, organization, and problem solving) and requires set up and cues to perform safely and efficiently. Pt also w/ visual deficits impacting safety w/ cooking    OT Occupational Profile and History Detailed Assessment- Review of Records and additional review of physical, cognitive, psychosocial history related to current functional performance    Occupational performance deficits (Please refer to evaluation for details): ADL's;IADL's;Work;Leisure    Body Structure / Function / Physical Skills ADL;Strength;Balance;UE functional use;IADL;Vision;Coordination;Mobility;Sensation;FMC    Cognitive Skills Memory;Perception;Problem Solve;Sequencing    Rehab Potential Good    Clinical Decision Making Several treatment options, min-mod task modification necessary    Comorbidities Affecting Occupational Performance: May have comorbidities impacting occupational performance    Modification or Assistance to Complete Evaluation  Min-Moderate modification of tasks or assist with assess necessary to complete eval    OT Frequency 2x / week    OT Duration 8 weeks   plus eval   OT Treatment/Interventions Self-care/ADL training;DME and/or AE instruction;Therapeutic activities;Therapeutic exercise;Cognitive remediation/compensation;Coping strategies training;Functional Mobility Training;Neuromuscular education;Visual/perceptual remediation/compensation;Patient/family education    Plan reassess remaining STG's and progress towards LTG's (LTG's will most  likely be revised for renewal)    Consulted and Agree with Plan of Care Patient;Family member/caregiver    Family Member Consulted wife           Patient will benefit from skilled therapeutic intervention in order to improve the following deficits and impairments:   Body Structure / Function / Physical Skills: ADL,Strength,Balance,UE functional use,IADL,Vision,Coordination,Mobility,Sensation,FMC Cognitive Skills: Memory,Perception,Problem Solve,Sequencing     Visit Diagnosis: Other symptoms and signs involving cognitive functions following cerebral infarction  Other lack of coordination    Problem List Patient Active Problem List   Diagnosis Date Noted  . Aortic calcification ([place]) 02/22/2020  . Carotid artery calcification, bilateral 02/22/2020  . MDD (major depressive disorder), recurrent episode ([place])   . GERD (gastroesophageal reflux disease)   . Ischemic stroke ([place]), occipital   . Diabetic polyneuropathy associated with type 2 diabetes mellitus ([place]) 06/30/2018  . Erectile dysfunction due to type 2 diabetes mellitus ([place]) 06/30/2018  . Circulation disorder of lower extremity 05/31/2015  . IBS (irritable bowel syndrome) 12/24/2014  . 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 Diabetic leg ulcer ([place]) 07/18/2013  . Restless legs syndrome (RLS) 11/20/2012  . Generalized anxiety disorder   . Type 2 diabetes mellitus with vascular disease ([place])   . Hyperlipidemia LDL goal <70 01/16/2009  . TOBACCO ABUSE 07/27/2007  . Morbid obesity ([employer] [employer]) 09/29/2006  . Essential hypertension 09/29/2006  . CALCULUS, KIDNEY 05/30/2002    Carey Bullocks, OTR/L 05/07/2020, 3:17 PM  [facility] [address] [place], [place], [address] Phone: [phone]  Fax:  [phone]  Name: Ryshawn Sanzone MRN: [id number] Date of Birth: 1960/11/17

## 2020-05-08 ENCOUNTER — Ambulatory Visit: Payer: Managed Care, Other (non HMO) | Admitting: Physical Therapy

## 2020-05-08 ENCOUNTER — Encounter: Payer: Self-pay | Admitting: Family Medicine

## 2020-05-08 ENCOUNTER — Ambulatory Visit: Payer: Managed Care, Other (non HMO) | Admitting: Speech Pathology

## 2020-05-08 DIAGNOSIS — E11622 Type 2 diabetes mellitus with other skin ulcer: Secondary | ICD-10-CM

## 2020-05-08 DIAGNOSIS — I69318 Other symptoms and signs involving cognitive functions following cerebral infarction: Secondary | ICD-10-CM | POA: Diagnosis not present

## 2020-05-08 DIAGNOSIS — R41841 Cognitive communication deficit: Secondary | ICD-10-CM

## 2020-05-08 DIAGNOSIS — R4701 Aphasia: Secondary | ICD-10-CM

## 2020-05-08 DIAGNOSIS — L97929 Non-pressure chronic ulcer of unspecified part of left lower leg with unspecified severity: Secondary | ICD-10-CM

## 2020-05-08 MED ORDER — LANCETS MICRO THIN 33G MISC: 5 refills | Status: DC

## 2020-05-08 NOTE — Patient Instructions (Signed)
   Neuropsychologist to evaluate cognition  Dr. Vella Kohler  Dr. Kieth Brightly

## 2020-05-08 NOTE — Therapy (Signed)
[facility] [address] [place], [place], [address] Phone: [phone]   Fax:  [phone]  Speech Language Pathology Treatment  Patient Details  Name: [name] [name] MRN: [id number] Date of Birth: 1960/11/10 Referring Provider (SLP): Bonnielee Haff (referring); Copland, Spencer (PCP/documentation)   Encounter Date: 05/08/2020   End of Session - 05/08/20 1350    Visit Number 14    Number of Visits 17    Date for SLP Re-Evaluation 05/22/20    SLP Start Time 1147    SLP Stop Time  1229    SLP Time Calculation (min) 42 min    Activity Tolerance Patient tolerated treatment well           Past Medical History:  Diagnosis Date  . Angio-edema   . Anxiety   . Depression   . Diabetes mellitus type II    type 2  . GERD (gastroesophageal reflux disease)   . Headache    migraine  . History of kidney stones   . HLD (hyperlipidemia)   . HTN (hypertension)   . Morbid obesity ([place])   . Restless legs syndrome (RLS) 11/20/2012  . Stroke ([place]) 02/14/2020   ischemic/right sided affected  . Tobacco abuse   . Ulcer of left ankle (HCC)    last 2 months dime size dry dressing changing q day  . Urticaria     Past Surgical History:  Procedure Laterality Date  . Arm surgery Right 1969   Abscess excision and debridement at elbow  . COLONOSCOPY WITH PROPOFOL N/A 09/06/2016   Procedure: COLONOSCOPY WITH PROPOFOL;  Surgeon: Doran Stabler, MD;  Location: [facility];  Service: Gastroenterology;  Laterality: N/A;  . I & D EXTREMITY Left 05/29/2014   Procedure: IRRIGATION AND DEBRIDEMENT OF LEFT LEG WOUND AND PLACEMENT OF  INTEGRA  AND  VAC;  Surgeon: Theodoro Kos, DO;  Location: [facility];  Service: Plastics;  Laterality: Left;  . implantable loop recorder implant  03/06/2020   Medtronic Reveal [place] model LNQ11 (SN  [id number]) implanted by Dr Rayann Heman for cryptogenic stroke  . INCISION AND DRAINAGE OF WOUND  Left 04/23/2014   Procedure: IRRIGATION AND DEBRIDEMENT LOWER LEFT LEG WOUND WITH PLACEMENT OF INTEGRA AND VAC;  Surgeon: Theodoro Kos, DO;  Location: [facility];  Service: Plastics;  Laterality: Left;  . kidney stone removal  2004  . MINOR APPLICATION OF WOUND VAC Left 04/23/2014   Procedure: MINOR APPLICATION OF WOUND VAC;  Surgeon: Theodoro Kos, DO;  Location: [facility];  Service: Plastics;  Laterality: Left;  . SKIN SPLIT GRAFT Left 06/18/2014   Procedure: SKIN GRAFT SPLIT THICKNESS TO LOWER LEFT LEG WOUND WITH PLACEMENT OF VAC;  Surgeon: Theodoro Kos, DO;  Location: [facility];  Service: Plastics;  Laterality: Left;  Marland Kitchen VASECTOMY  1994    There were no vitals filed for this visit.          ADULT SLP TREATMENT - 05/08/20 1204      General Information   Behavior/Cognition Alert;Cooperative;Pleasant mood      Treatment Provided   Treatment provided Cognitive-Linquistic      Cognitive-Linquistic Treatment   Treatment focused on Aphasia;Patient/family/caregiver education    Skilled Treatment Spouse, [name] reports they contacted [facility] clinic for possible participation in study for reading comprehension and written expresion in future. Reviewed [name]'s HW, he write 4 phrases describing 4 steps in IADL's. He had 3-4 errors per sequence. He re-read at  home and correct 50% of errors. In [place] required usual min to mod verbal and written cues to ID and correct errors.Overall extended time to read and comprehend short phrases. Targeted mildly word finding using alphabet and simple topic to generate (say and write) words in category. [name] required extended time and occasional min  sematnic, questioning cues for word finding and frequent min to mod A to ID and self correct written errors. In written word task, I introduced conversation as distraction. [name] was able to alternate attention between task and  with supervision cues, however  mild  extended timer required to re-orient to word generating task      Assessment / Recommendations / [place] with current plan of care      Progression Toward Goals   Progression toward goals Progressing toward goals              SLP Short Term Goals - 05/08/20 1349      SLP SHORT TERM GOAL #1   Title Patient will participate functionally in 8 minutes simple-mod complex conversation using compensations for anomia as necessary, x3 visits.    Baseline 03/31/20 04/02/20    Time 1    Period Weeks    Status Partially Met      SLP SHORT TERM GOAL #2   Title Pt will demonstrate auditory comprehension of 5 minutes simple-mod complex material by answering [place] questions >90% accuracy x 2 sessions.    Baseline 04/02/20    Time 1    Period Weeks    Status Partially Met      SLP SHORT TERM GOAL #3   Title Pt will participate in assessment of reading, writing and cognition with goals added PRN    Baseline writing assessed 03/12/20; allowing more time for assessment due to anxiety    Time 1    Period Weeks    Status Partially Met            SLP Long Term Goals - 05/08/20 1349      SLP LONG TERM GOAL #1   Title Patient will participate functionally in 15 minutes mod complex-complex conversation using compensations as necessary x 3 visits.    Baseline 04/28/20; 05/08/20    Time 1    Period Weeks    Status On-going      SLP LONG TERM GOAL #2   Title Patient will demonstrate auditory comprehension of 15 minutes mod complex content (conversation, podcast, radio program, etc) x 3 visits by answering detailed questions >90% accuracy.    Baseline 05/08/20    Time 1    Period Weeks    Status On-going      SLP LONG TERM GOAL #3   Title Patient will report using accessibility features to read and reply to text messages x 3 visits.    Time 3    Period Weeks    Status Achieved      SLP LONG TERM GOAL #4   Title Pt will use compensations for processing mod complex  written/verbal information with mod I x 2 sessions.    Time 1    Period Weeks    Status On-going      SLP LONG TERM GOAL #5   Title Pt will complete mod complex functional problem solving activities >90% accuracy with extra time.    Baseline 11/39/21;    Time 3    Period Weeks    Status On-going  Plan - 05/08/20 1343    Clinical Impression Statement [name] continues to present with mild verbal expressive aphasia, moderate reading comprehension and written expression aphasia. He ID's 50% of written errors at word and phrase level and requires frequent min to mod A to correct written errors. Reading comprehension consistently requires significant extended time at phrase/short sentence level. Mild cognitive impairments of attention, memory and processing persist. [name] and his family carryover strategies to compensate for cognitive impairments. 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 Continue skilled ST to maximize safety, communication and cognition.    Speech Therapy Frequency 2x / week    Duration 8 weeks   17 visits   Treatment/Interventions Language facilitation;Environmental controls;Cueing hierarchy;SLP instruction and feedback;Cognitive reorganization;Compensatory techniques;Functional tasks;Compensatory strategies;Internal/external aids;Multimodal communcation approach;Patient/family education    Potential to Achieve Goals Good           Patient will benefit from skilled therapeutic intervention in order to improve the following deficits and impairments:   Aphasia  Cognitive communication deficit    Problem List Patient Active Problem List   Diagnosis Date Noted  . Aortic calcification ([place]) 02/22/2020  . Carotid artery calcification, bilateral 02/22/2020  . MDD (major depressive disorder), recurrent  episode ([place])   . GERD (gastroesophageal reflux disease)   . Ischemic stroke ([place]), occipital   . Diabetic polyneuropathy associated with type 2 diabetes mellitus ([facility]) 06/30/2018  . Erectile dysfunction due to type 2 diabetes mellitus ([place]) 06/30/2018  . Circulation disorder of lower extremity 05/31/2015  . IBS (irritable bowel syndrome) 12/24/2014  . 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 Diabetic leg ulcer ([place]) 07/18/2013  . Restless legs syndrome (RLS) 11/20/2012  . Generalized anxiety disorder   . Type 2 diabetes mellitus with vascular disease ([place])   . Hyperlipidemia LDL goal <70 01/16/2009  . TOBACCO ABUSE 07/27/2007  . Morbid obesity ([place]) 09/29/2006  . Essential hypertension 09/29/2006  . CALCULUS, KIDNEY 05/30/2002    Jouri Threat, Annye Rusk MS, CCC-SLP 05/08/2020, 1:51 PM  [facility] [address] [place], [place], [address] Phone: [phone]   Fax:  [phone]   Name: Erian Rosengren MRN: [id number] Date of Birth: November 22, 1960

## 2020-05-11 LAB — CUP PACEART REMOTE DEVICE CHECK
Date Time Interrogation Session: 20211213102700
Implantable Pulse Generator Implant Date: 20211008

## 2020-05-12 ENCOUNTER — Other Ambulatory Visit: Payer: Self-pay

## 2020-05-12 ENCOUNTER — Other Ambulatory Visit: Payer: Self-pay | Admitting: Family Medicine

## 2020-05-12 ENCOUNTER — Ambulatory Visit: Payer: Managed Care, Other (non HMO) | Admitting: Occupational Therapy

## 2020-05-12 ENCOUNTER — Ambulatory Visit: Payer: Managed Care, Other (non HMO)

## 2020-05-12 ENCOUNTER — Ambulatory Visit: Payer: Managed Care, Other (non HMO) | Admitting: Physical Therapy

## 2020-05-12 DIAGNOSIS — I69398 Other sequelae of cerebral infarction: Secondary | ICD-10-CM

## 2020-05-12 DIAGNOSIS — R41841 Cognitive communication deficit: Secondary | ICD-10-CM

## 2020-05-12 DIAGNOSIS — R279 Unspecified lack of coordination: Secondary | ICD-10-CM

## 2020-05-12 DIAGNOSIS — I69320 Aphasia following cerebral infarction: Secondary | ICD-10-CM

## 2020-05-12 DIAGNOSIS — I639 Cerebral infarction, unspecified: Secondary | ICD-10-CM

## 2020-05-12 DIAGNOSIS — I69318 Other symptoms and signs involving cognitive functions following cerebral infarction: Secondary | ICD-10-CM | POA: Diagnosis not present

## 2020-05-12 DIAGNOSIS — I6930 Unspecified sequelae of cerebral infarction: Secondary | ICD-10-CM

## 2020-05-12 DIAGNOSIS — R4701 Aphasia: Secondary | ICD-10-CM

## 2020-05-13 NOTE — Patient Instructions (Signed)
  Please complete the assigned speech therapy homework prior to your next session and return it to the speech therapist at your next visit.  

## 2020-05-13 NOTE — Therapy (Signed)
[facility] [address] [place], [place], [address] Phone: [phone]   Fax:  [phone]  Speech Language Pathology Treatment  Patient Details  Name: [name] [name] MRN: [id number] Date of Birth: 1960-09-23 Referring Provider (SLP): Bonnielee Haff (referring); Copland, Spencer (PCP/documentation)   Encounter Date: 05/12/2020   End of Session - 05/13/20 0000    Visit Number 15    Number of Visits 17    Date for SLP Re-Evaluation 05/22/20    SLP Start Time 28    SLP Stop Time  1100    SLP Time Calculation (min) 41 min    Activity Tolerance Patient tolerated treatment well           Past Medical History:  Diagnosis Date  . Angio-edema   . Anxiety   . Depression   . Diabetes mellitus type II    type 2  . GERD (gastroesophageal reflux disease)   . Headache    migraine  . History of kidney stones   . HLD (hyperlipidemia)   . HTN (hypertension)   . Morbid obesity ([place])   . Restless legs syndrome (RLS) 11/20/2012  . Stroke ([place]) 02/14/2020   ischemic/right sided affected  . Tobacco abuse   . Ulcer of left ankle (HCC)    last 2 months dime size dry dressing changing q day  . Urticaria     Past Surgical History:  Procedure Laterality Date  . Arm surgery Right 1969   Abscess excision and debridement at elbow  . COLONOSCOPY WITH PROPOFOL N/A 09/06/2016   Procedure: COLONOSCOPY WITH PROPOFOL;  Surgeon: Doran Stabler, MD;  [facility];  Service: Gastroenterology;  Laterality: N/A;  . I & D EXTREMITY Left 05/29/2014   Procedure: IRRIGATION AND DEBRIDEMENT OF LEFT LEG WOUND AND PLACEMENT OF  INTEGRA  AND  VAC;  Surgeon: Theodoro Kos, DO;  Location: [facility];  Service: Plastics;  Laterality: Left;  . implantable loop recorder implant  03/06/2020   Medtronic Reveal [place] model LNQ11 (SN  [id number]) implanted by Dr Rayann Heman for cryptogenic stroke  . INCISION AND DRAINAGE OF WOUND  Left 04/23/2014   Procedure: IRRIGATION AND DEBRIDEMENT LOWER LEFT LEG WOUND WITH PLACEMENT OF INTEGRA AND VAC;  Surgeon: Theodoro Kos, DO;  Location: [facility];  Service: Plastics;  Laterality: Left;  . kidney stone removal  2004  . MINOR APPLICATION OF WOUND VAC Left 04/23/2014   Procedure: MINOR APPLICATION OF WOUND VAC;  Surgeon: Theodoro Kos, DO;  Location: [facility];  Service: Plastics;  Laterality: Left;  . SKIN SPLIT GRAFT Left 06/18/2014   Procedure: SKIN GRAFT SPLIT THICKNESS TO LOWER LEFT LEG WOUND WITH PLACEMENT OF VAC;  Surgeon: Theodoro Kos, DO;  Location: [facility];  Service: Plastics;  Laterality: Left;  Marland Kitchen VASECTOMY  1994    There were no vitals filed for this visit.   Subjective Assessment - 05/12/20 1024    Currently in Pain? No/denies                 ADULT SLP TREATMENT - 05/12/20 1025      General Information   Behavior/Cognition Alert;Cooperative;Pleasant mood      Treatment Provided   Treatment provided Cognitive-Linquistic      Cognitive-Linquistic Treatment   Treatment focused on Aphasia;Patient/family/caregiver education    Skilled Treatment SLP asked pt for his homework.He provided with minimal search time. Pt had 17/26 celebrities beginning with each letter of alphabet -  3/26 were characters so SLP assisted pt in thinking of other celebrities -minimal cues necessary for 1/3 and other 2/3 were dificult (I and Y). SLP encouraged pt to continue with Talk Path therapy and to primarily complete tasks that were challenging but using the easier tasks as a "brain break." SLP congratulated pt on his participation with [place]. Pt indicated he is going to have neuropscyh testing by describing it to SLP ("A 2-3 -hour -test -with someone else - I messaged -my- primary -to -get a - referral").SLP had pt look at his 3 sequences he had written down and correct - min-mod cues occasionally needed. Spelling was functional - SLP  assisted pt to correct grammar or add words and pt did so x4 with rare min A.      Assessment / Recommendations / Plan   Plan Continue with current plan of care      Progression Toward Goals   Progression toward goals Progressing toward goals              SLP Short Term Goals - 05/08/20 1349      SLP SHORT TERM GOAL #1   Title Patient will participate functionally in 8 minutes simple-mod complex conversation using compensations for anomia as necessary, x3 visits.    Baseline 03/31/20 04/02/20    Time 1    Period Weeks    Status Partially Met      SLP SHORT TERM GOAL #2   Title Pt will demonstrate auditory comprehension of 5 minutes simple-mod complex material by answering [place] questions >90% accuracy x 2 sessions.    Baseline 04/02/20    Time 1    Period Weeks    Status Partially Met      SLP SHORT TERM GOAL #3   Title Pt will participate in assessment of reading, writing and cognition with goals added PRN    Baseline writing assessed 03/12/20; allowing more time for assessment due to anxiety    Time 1    Period Weeks    Status Partially Met            SLP Long Term Goals - 05/13/20 0001      SLP LONG TERM GOAL #1   Title Patient will participate functionally in 15 minutes mod complex-complex conversation using compensations as necessary x 3 visits.    Baseline 04/28/20; 05/08/20    Time 1    Period Weeks    Status On-going      SLP LONG TERM GOAL #2   Title Patient will demonstrate auditory comprehension of 15 minutes mod complex content (conversation, podcast, radio program, etc) x 3 visits by answering detailed questions >90% accuracy.    Baseline 05/08/20    Time 1    Period Weeks    Status On-going      SLP LONG TERM GOAL #3   Title Patient will report using accessibility features to read and reply to text messages x 3 visits.    Time 3    Period Weeks    Status Achieved      SLP LONG TERM GOAL #4   Title Pt will use compensations for processing mod  complex written/verbal information with mod I x 2 sessions.    Time 1    Period Weeks    Status On-going      SLP LONG TERM GOAL #5   Title Pt will complete mod complex functional problem solving activities >90% accuracy with extra time.    Baseline 04/27/20; 05-12-20  Time 2    Period Weeks    Status On-going            Plan - 05/13/20 0000    Clinical Impression Statement [name] continues to present with mild verbal expressive aphasia, moderate reading comprehension and written expression aphasia. He ID's 50% of written errors at word and phrase level and requires frequent min to mod A to correct written errors. Reading comprehension consistently requires significant extended time at phrase/short sentence level. Mild cognitive impairments of attention, memory and processing persist. [name] and his family carryover strategies to compensate for cognitive impairments. 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 Continue skilled ST to maximize safety, communication and cognition.    Speech Therapy Frequency 2x / week    Duration 8 weeks   17 visits   Treatment/Interventions Language facilitation;Environmental controls;Cueing hierarchy;SLP instruction and feedback;Cognitive reorganization;Compensatory techniques;Functional tasks;Compensatory strategies;Internal/external aids;Multimodal communcation approach;Patient/family education    Potential to Achieve Goals Good           Patient will benefit from skilled therapeutic intervention in order to improve the following deficits and impairments:   Aphasia  Cognitive communication deficit    Problem List Patient Active Problem List   Diagnosis Date Noted  . Aortic calcification ([place]) 02/22/2020  . Carotid artery calcification, bilateral 02/22/2020  . MDD (major depressive  disorder), recurrent episode ([place])   . GERD (gastroesophageal reflux disease)   . Ischemic stroke ([place]), occipital   . Diabetic polyneuropathy associated with type 2 diabetes mellitus ([place]) 06/30/2018  . Erectile dysfunction due to type 2 diabetes mellitus ([place]) 06/30/2018  . Circulation disorder of lower extremity 05/31/2015  . IBS (irritable bowel syndrome) 12/24/2014  . 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 Diabetic leg ulcer ([facility]) 07/18/2013  . Restless legs syndrome (RLS) 11/20/2012  . Generalized anxiety disorder   . Type 2 diabetes mellitus with vascular disease ([place])   . Hyperlipidemia LDL goal <70 01/16/2009  . TOBACCO ABUSE 07/27/2007  . Morbid obesity ([place]) 09/29/2006  . Essential hypertension 09/29/2006  . CALCULUS, KIDNEY 05/30/2002    [facility] ,MS, CCC-SLP  05/13/2020, 12:02 AM  [facility] [address] [place], [place], [address] Phone: [phone]   Fax:  [phone]   Name: Conrado Nance MRN: [id number] Date of Birth: December 15, 1960

## 2020-05-14 ENCOUNTER — Other Ambulatory Visit: Payer: Self-pay

## 2020-05-14 ENCOUNTER — Ambulatory Visit: Payer: Managed Care, Other (non HMO) | Admitting: Occupational Therapy

## 2020-05-14 DIAGNOSIS — R278 Other lack of coordination: Secondary | ICD-10-CM

## 2020-05-14 DIAGNOSIS — R41842 Visuospatial deficit: Secondary | ICD-10-CM

## 2020-05-14 DIAGNOSIS — I69318 Other symptoms and signs involving cognitive functions following cerebral infarction: Secondary | ICD-10-CM | POA: Diagnosis not present

## 2020-05-14 DIAGNOSIS — M6281 Muscle weakness (generalized): Secondary | ICD-10-CM

## 2020-05-14 DIAGNOSIS — R2681 Unsteadiness on feet: Secondary | ICD-10-CM

## 2020-05-14 NOTE — Patient Instructions (Signed)
Cognitive Tips  Keep a journal/notebook with sections for the following (or use sections separately as needed):  Calendar and appointment sheet, schedule for each day, lists of reminders (such as grocery lists or "to do" list), homework assignments for therapy, important information such as family and friends names / addresses / phone numbers, medications, medical history and doctors name / phone numbers.  Avoid / remove clutter and unnecessary items from areas such as countertops / cabinets in kitchen and bathroom, closets, etc.  Organize items by purpose.  Baskets and bins help with this.  Leave notes for reminders above task to be completed.  For example: Note to turn off stove over the the stove; note to lock door beside the door, not to brush teeth then wash face by sink, note to take medication on table etc.  To help recall names of people of people or things, mentally or verbally go through the alphabet to try to determine the 1st letter of the word as this may trigger the name or word you are looking for.  If this is too difficult and someone else knows the word, have them give you the first letter by asking, "does it start with an "A", "B", "C" etc. (or have them give you the first sound of the word or some other clue).  Review family events, occasions, names, etc.  Pictures are a good way to trigger memory.  Have others correct you if you answer something incorrectly.  Have them speak slowly with a few words to give you time to process and respond.  Don't let others automatically problem-solve. (For example: don't let them automatically lay out clothes in the correct position, but hand it to you folded so that you can figure it out for yourself.)  However, if you need help with tasks, they should give you as little as they can so that you can be successful.  If appropriate and safe, they may allow you to make mistakes so that you can figure out how to correct the error.  (For example, they  may allow you to put your shoes on the wrong foot to see if you notice that is wrong).  If you struggle, they should give you a cue.  (Example: "Do your shoes feel right?"  "Do they look right?")  Keeping Thinking Skills Sharp: 1. Jigsaw puzzles 2. Card/board games 3. Talking on the phone/social events 4. Lumosity.com 5. Online games 6. Word serches/crossword puzzles 7.  Logic puzzles 8. Aerobic exercise (stationary bike) 9. Eating balanced diet (fruits & veggies) 10. Drink water 11. Try something new--new recipe, hobby 12. Add cognitive activities to walking/exercising (think of animal/food/city with each letter of the alphabet, counting backwards, thinking of as many vegetables as you can, etc.).

## 2020-05-14 NOTE — Therapy (Signed)
[facility] [address] [place], [place], [address] Phone: [phone]   Fax:  [phone]  Occupational Therapy Treatment/Recertification  Patient Details  Name: [name] [name] MRN: [id number] Date of Birth: 12/16/1960 Referring Provider (OT): Dr. Erlinda Hong   Encounter Date: 05/14/2020   OT End of Session - 05/14/20 1430    Visit Number 14    Number of Visits 24    Date for OT Re-Evaluation 06/17/20    Authorization Type Cigna/Cigna managed    Authorization Time Period 60 days    OT Start Time 1100    OT Stop Time 1145    OT Time Calculation (min) 45 min    Activity Tolerance Patient tolerated treatment well;Other (comment)           Past Medical History:  Diagnosis Date  . Angio-edema   . Anxiety   . Depression   . Diabetes mellitus type II    type 2  . GERD (gastroesophageal reflux disease)   . Headache    migraine  . History of kidney stones   . HLD (hyperlipidemia)   . HTN (hypertension)   . Morbid obesity ([place])   . Restless legs syndrome (RLS) 11/20/2012  . Stroke ([place]) 02/14/2020   ischemic/right sided affected  . Tobacco abuse   . Ulcer of left ankle (HCC)    last 2 months dime size dry dressing changing q day  . Urticaria     Past Surgical History:  Procedure Laterality Date  . Arm surgery Right 1969   Abscess excision and debridement at elbow  . COLONOSCOPY WITH PROPOFOL N/A 09/06/2016   Procedure: COLONOSCOPY WITH PROPOFOL;  Surgeon: Doran Stabler, MD;  Location: [facility];  Service: Gastroenterology;  Laterality: N/A;  . I & D EXTREMITY Left 05/29/2014   Procedure: IRRIGATION AND DEBRIDEMENT OF LEFT LEG WOUND AND PLACEMENT OF  INTEGRA  AND  VAC;  Surgeon: Theodoro Kos, DO;  Location: [facility];  Service: Plastics;  Laterality: Left;  . implantable loop recorder implant  03/06/2020   Medtronic Reveal [place] model LNQ11 (SN  [id number]) implanted by Dr Rayann Heman for  cryptogenic stroke  . INCISION AND DRAINAGE OF WOUND Left 04/23/2014   Procedure: IRRIGATION AND DEBRIDEMENT LOWER LEFT LEG WOUND WITH PLACEMENT OF INTEGRA AND VAC;  Surgeon: Theodoro Kos, DO;  Location: [facility];  Service: Plastics;  Laterality: Left;  . kidney stone removal  2004  . MINOR APPLICATION OF WOUND VAC Left 04/23/2014   Procedure: MINOR APPLICATION OF WOUND VAC;  Surgeon: Theodoro Kos, DO;  Location: [facility];  Service: Plastics;  Laterality: Left;  . SKIN SPLIT GRAFT Left 06/18/2014   Procedure: SKIN GRAFT SPLIT THICKNESS TO LOWER LEFT LEG WOUND WITH PLACEMENT OF VAC;  Surgeon: Theodoro Kos, DO;  Location: [facility];  Service: Plastics;  Laterality: Left;  Marland Kitchen VASECTOMY  1994    There were no vitals filed for this visit.   Subjective Assessment - 05/14/20 1138    Subjective  Pt/wife in agreement with POC for renewal period    Patient is accompanied by: Family member   wife   Pertinent History CVA 02/14/20 Lt occipital lobe. PMH: 2 small previous strokes, DM, GERD, HLD, HTN (but now has issues w/ orthostasis)    Limitations no driving, loop recorder    Currently in Pain? No/denies            Therapist discussed progress and current goals with  patient/wife and revised LTG's prn for renewal period beginning next week (renewal completed today), as well as reassessing unmet or partially met STG's.  Letter cancellation for visual scanning (65M print size) with approx 80% accuracy.  9 hole peg test Rt hand = 25.56 sec                      OT Short Term Goals - 05/14/20 1431      OT SHORT TERM GOAL #1   Title Pt will be independent with HEP for Rt hand coordination and Rt shoulder (low range strengthening)    Time 4    Period Weeks    Status Achieved   MET for coordination     OT SHORT TERM GOAL #2   Title Pt will be independent with visual scanning strategies to compensate for Rt visual field cut    Time  4    Period Weeks    Status Achieved      OT SHORT TERM GOAL #3   Title Improve coordination Rt hand to 28 sec or less for high level tasks    Baseline 33.91 sec    Time 4    Period Weeks    Status Achieved   25.56 sec     OT SHORT TERM GOAL #4   Title Pt will verbalize understanding with memory compensatory strategies and utilize for medication and financial management    Time 4    Period Weeks    Status Achieved      OT SHORT TERM GOAL #5   Title Pt will perform simple financial management tasks using calculator prn with supervision and no more than min v.c's    Time 4    Period Weeks    Status Achieved   min cues to copy numbers correctly from calculator to paper     OT SHORT TERM GOAL #6   Title Pt will perform simple stovetop task with supervision only    Time 4    Period Weeks    Status Partially Met   assist for cutting or to take something off stove or out of oven for safety d/t vision, supervision and cues required otherwise     OT SHORT TERM GOAL #7   Title Pt will perform tabletop scanning with 100% accuracy and environmental scanning with 75% or greater accuracy    Time 4    Period Weeks    Status Partially Met   met w/ environmental scanning (92%), but inconsistent with tabletop scanning - approx 80%            OT Long Term Goals - 05/14/20 1433      OT LONG TERM GOAL #1   Title Pt to return to cooking with direct supervision    Time 8    Period Weeks    Status Revised      OT LONG TERM GOAL #2   Title Pt will complete meal plan for the week and develop corresponding grocery list I'ly    Time 8    Period Weeks    Status On-going      OT LONG TERM GOAL #3   Title Pt will perform full financial management tasks at 100% accuracy with no more than min cues    Time 8    Period Weeks    Status Revised      OT LONG TERM GOAL #4   Title Pt will demo organizational and problem solving skills to complete  and adapt a schedule prn with no more than min  cues    Time 8    Period Weeks    Status Revised      OT LONG TERM GOAL #5   Title Pt will perform environmental scanning with 90% or greater accuracy consistently    Time 8    Period Weeks    Status Revised                 Plan - 05/14/20 1435    Clinical Impression Statement Pt progressing slowly towards goals. Assessed and revised LTG's prn for renewal period begninning next week (renewal completed today). See goal section for updates/revisions.    Occupational performance deficits (Please refer to evaluation for details): ADL's;IADL's;Work;Leisure    Body Structure / Function / Physical Skills ADL;Strength;Balance;UE functional use;IADL;Vision;Coordination;Mobility;Sensation;FMC    Cognitive Skills Memory;Perception;Problem Solve;Sequencing;Thought    Rehab Potential Good    Comorbidities Affecting Occupational Performance: May have comorbidities impacting occupational performance    OT Frequency 2x / week    OT Duration --   for 5 additional weeks   OT Treatment/Interventions Self-care/ADL training;DME and/or AE instruction;Therapeutic activities;Therapeutic exercise;Cognitive remediation/compensation;Coping strategies training;Functional Mobility Training;Neuromuscular education;Visual/perceptual remediation/compensation;Patient/family education    Plan Renewal completed today for 5 additional weeks - continue progress towards ongoing and revised LTG's    Consulted and Agree with Plan of Care Patient;Family member/caregiver    Family Member Consulted wife           Patient will benefit from skilled therapeutic intervention in order to improve the following deficits and impairments:   Body Structure / Function / Physical Skills: ADL,Strength,Balance,UE functional use,IADL,Vision,Coordination,Mobility,Sensation,FMC Cognitive Skills: Memory,Perception,Problem Solve,Sequencing,Thought     Visit Diagnosis: Other symptoms and signs involving cognitive functions following  cerebral infarction  Other lack of coordination  Visuospatial deficit  Muscle weakness (generalized)  Unsteadiness on feet    Problem List Patient Active Problem List   Diagnosis Date Noted  . Aortic calcification ([place]) 02/22/2020  . Carotid artery calcification, bilateral 02/22/2020  . MDD (major depressive disorder), recurrent episode ([place])   . GERD (gastroesophageal reflux disease)   . Ischemic stroke ([place]), occipital   . Diabetic polyneuropathy associated with type 2 diabetes mellitus ([place]) 06/30/2018  . Erectile dysfunction due to type 2 diabetes mellitus ([place]) 06/30/2018  . Circulation disorder of lower extremity 05/31/2015  . IBS (irritable bowel syndrome) 12/24/2014  . 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 Diabetic leg ulcer ([place]) 07/18/2013  . Restless legs syndrome (RLS) 11/20/2012  . Generalized anxiety disorder   . Type 2 diabetes mellitus with vascular disease ([place])   . Hyperlipidemia LDL goal <70 01/16/2009  . TOBACCO ABUSE 07/27/2007  . Morbid obesity ([place]) 09/29/2006  . Essential hypertension 09/29/2006  . CALCULUS, KIDNEY 05/30/2002    Carey Bullocks, OTR/L 05/14/2020, 2:38 PM  [facility] [address] [place], [place], [address] Phone: [phone]   Fax:  [phone]  Name: [name] [name] MRN: [id number] Date of Birth: [birth year]

## 2020-05-15 ENCOUNTER — Ambulatory Visit: Payer: Managed Care, Other (non HMO) | Admitting: Physical Therapy

## 2020-05-15 ENCOUNTER — Encounter: Payer: Self-pay | Admitting: Physical Therapy

## 2020-05-15 ENCOUNTER — Encounter: Payer: Self-pay | Admitting: Speech Pathology

## 2020-05-15 ENCOUNTER — Ambulatory Visit: Payer: Managed Care, Other (non HMO) | Admitting: Speech Pathology

## 2020-05-15 DIAGNOSIS — R2681 Unsteadiness on feet: Secondary | ICD-10-CM

## 2020-05-15 DIAGNOSIS — R278 Other lack of coordination: Secondary | ICD-10-CM

## 2020-05-15 DIAGNOSIS — R2689 Other abnormalities of gait and mobility: Secondary | ICD-10-CM

## 2020-05-15 DIAGNOSIS — R41841 Cognitive communication deficit: Secondary | ICD-10-CM

## 2020-05-15 DIAGNOSIS — I69318 Other symptoms and signs involving cognitive functions following cerebral infarction: Secondary | ICD-10-CM | POA: Diagnosis not present

## 2020-05-15 DIAGNOSIS — R4701 Aphasia: Secondary | ICD-10-CM

## 2020-05-15 DIAGNOSIS — M6281 Muscle weakness (generalized): Secondary | ICD-10-CM

## 2020-05-15 NOTE — Therapy (Signed)
[facility] [address] [place], [place], [address] Phone: [phone]   Fax:  [phone]  Physical Therapy Treatment  Patient Details  Name: [name] [name] MRN: [id number] Date of Birth: 1961/02/04 Referring Provider (PT): Dr. Dallas Schimke   Encounter Date: 05/15/2020   PT End of Session - 05/15/20 1043    Visit Number 11    Number of Visits 17    Date for PT Re-Evaluation 06/16/20    Authorization Type Cigna Managed    Progress Note Due on Visit 9    PT Start Time 1025    PT Stop Time 1105    PT Time Calculation (min) 40 min    Equipment Utilized During Treatment Gait belt    Activity Tolerance Patient tolerated treatment well    Behavior During Therapy WFL for tasks assessed/performed           Past Medical History:  Diagnosis Date  . Angio-edema   . Anxiety   . Depression   . Diabetes mellitus type II    type 2  . GERD (gastroesophageal reflux disease)   . Headache    migraine  . History of kidney stones   . HLD (hyperlipidemia)   . HTN (hypertension)   . Morbid obesity (H[facility])   . Restless legs syndrome (RLS) 11/20/2012  . Stroke ([facility]CC) 02/14/2020   ischemic/right sided affected  . Tobacco abuse   . Ulcer of left ankle (HCC)    last 2 months dime size dry dressing changing q day  . Urticaria     Past Surgical History:  Procedure Laterality Date  . Arm surgery Right 1969   Abscess excision and debridement at elbow  . COLONOSCOPY WITH PROPOFOL N/A 09/06/2016   Procedure: COLONOSCOPY WITH PROPOFOL;  Surgeon: Sherrilyn Rist, MD;  [facility];  Service: Gastroenterology;  Laterality: N/A;  . I & D EXTREMITY Left 05/29/2014   Procedure: IRRIGATION AND DEBRIDEMENT OF LEFT LEG WOUND AND PLACEMENT OF  INTEGRA  AND  VAC;  Surgeon: Wayland Denis, DO;  Location: [facility];  Service: Plastics;  Laterality: Left;  . implantable loop recorder implant  03/06/2020   Medtronic  Reveal [place] model LNQ11 (SN  [id number]) implanted by Dr Johney Frame for cryptogenic stroke  . INCISION AND DRAINAGE OF WOUND Left 04/23/2014   Procedure: IRRIGATION AND DEBRIDEMENT LOWER LEFT LEG WOUND WITH PLACEMENT OF INTEGRA AND VAC;  Surgeon: Wayland Denis, DO;  Location: [facility];  Service: Plastics;  Laterality: Left;  . kidney stone removal  2004  . MINOR APPLICATION OF WOUND VAC Left 04/23/2014   Procedure: MINOR APPLICATION OF WOUND VAC;  Surgeon: Wayland Denis, DO;  Location: [facility];  Service: Plastics;  Laterality: Left;  . SKIN SPLIT GRAFT Left 06/18/2014   Procedure: SKIN GRAFT SPLIT THICKNESS TO LOWER LEFT LEG WOUND WITH PLACEMENT OF VAC;  Surgeon: Wayland Denis, DO;  Location: [facility];  Service: Plastics;  Laterality: Left;  Marland Kitchen VASECTOMY  1994    There were no vitals filed for this visit.   Subjective Assessment - 05/15/20 1030    Subjective Pt states the exercises have been good. Pt states he's been walking a lot in stores and outside -- pt has been not needing as many rest breaks. Pt states his floaters are worse than usual today.    Pertinent History hypertension, hyperlipidemia, type 2 diabetes mellitus, restless leg syndrome, diabetic neuropathy, morbid obesity, tobacco abuse, depression/anxiety, GERD,  chronic venous stasis ulcer in bilateral lower extremities    Limitations Reading;Walking;House hold activities    How long can you sit comfortably? no issues    How long can you stand comfortably? 10 min    How long can you walk comfortably? 5 min    Diagnostic tests CT head showed Hyperdense right MCA.  CTA head and neck and perfusion study showed 10 cc region of completed infarction in left occipital cortex and additional 11 cc at risk/penumbra. [name] (wife) reports that short term memory is affected.    Patient Stated Goals improve walking    Currently in Pain? No/denies                             [facility]  Adult PT Treatment/Exercise - 05/15/20 0001      Ambulation/Gait   Ambulation/Gait Yes    Ambulation/Gait Assistance 5: Supervision    Ambulation/Gait Assistance Details Outside on pavement up/down side walk; working on intermittent fast/slow and left/right head turns    Ambulation Distance (Feet) --   ~500 ft   Assistive device None    Gait Pattern Step-through pattern    Ambulation Surface Level;Outdoor;Paved      Knee/Hip Exercises: Machines for Strengthening   Total Gym Leg Press DL 33# [id number]      Knee/Hip Exercises: Standing   Other Standing Knee Exercises hamstring curl 3x10 with 3#    Other Standing Knee Exercises Mini deadlift x10 no weight, x10 5#, x10 10#                    PT Short Term Goals - 03/25/20 1131      PT SHORT TERM GOAL #1   Title patient will be able to walk with or without AD for 10 min to improve walking endurance    Baseline <5 min without AD, 12 minutes with SPC    Time 4    Period Weeks    Status Achieved    Target Date 03/24/20             PT Long Term Goals - 05/15/20 1120      PT LONG TERM GOAL #1   Title Pt will be able to ambulate for 30 min with or without AD to improve walking endurance (TARGET DATE 06/16/20)    Baseline <5 min without AD; able to amb 30 min with rest breaks (04/21/20)    Time 8    Period Weeks    Status Revised      PT LONG TERM GOAL #2   Title Pt will demo 12 sec or less with 5x/ sit to stand without HHA to improve functional strength (TARGET DATE 06/16/20)    Baseline 15 sec without HHA; 13.41 sec (04/21/20)    Time 8    Period Weeks    Status Revised      PT LONG TERM GOAL #3   Title Pt will increase DGI score to at least 20/24 to demo decreased fall risk (TARGET DATE 06/16/20)    Baseline 17/24 on 04/21/20    Time 8    Period Weeks    Status New      PT LONG TERM GOAL #4   Title Pt and family would like pt to be able to squat at least 25# to demo improved bilat LE and trunk strength (TARGET DATE  06/16/20)    Time 8    Period Tania Ade  Status New      PT LONG TERM GOAL #5   Title Pt would like to be able to ambulate 6 min at least 581 ft without a/d to demo improved endurance and community mobility (TARGET DATE 06/16/20)    Baseline 481' on 6 MWT with use of SPC (04/21/20)    Time 8    Period Weeks    Status New      Additional Long Term Goals   Additional Long Term Goals Yes      PT LONG TERM GOAL #6   Title Pt will have improved FOTO score to at least 66% (TARGET DATE 06/16/20)    Baseline 45% at eval    Time 4    Period Weeks    Status New    Target Date 06/16/20                 Plan - 05/15/20 1116    Clinical Impression Statement Treatment focused primarily on strengthening and dynamic gait. Progressed pt to machine strengthening -- pt tolerated well with no increase in his knee pain. Initiated mini deadlifts; pt able to perform with good form given cueing. Pt continues to remain challenged with adequate changes in gait speed. PT continuing to work on pt's long term goals.    Personal Factors and Comorbidities Comorbidity 3+;Time since onset of injury/illness/exacerbation    Comorbidities hypertension, hyperlipidemia, type 2 diabetes mellitus, restless leg syndrome, diabetic neuropathy, morbid obesity, tobacco abuse, depression/anxiety, GERD, chronic venous stasis ulcer in bilateral lower extremities    Examination-Activity Limitations Squat;Stand;Stairs    Examination-Participation Restrictions Community Activity;Yard Work;Shop    Rehab Potential Good    PT Frequency 1x / week    PT Duration 8 weeks    PT Treatment/Interventions ADLs/Self Care Home Management;Gait training;Stair training;Functional mobility training;Therapeutic activities;Therapeutic exercise;Balance training;Neuromuscular re-education;Patient/family education;Manual techniques;Passive range of motion;Energy conservation;Joint Manipulations    PT Next Visit Plan How are strengthening exercises?  Progress to green tband? Walking outside for endurance. Continue exercises for BLE strengthening (consider increasing quad and hamstring strengthening). Consider ambulating on compliant surfaces and different levels.    PT Home Exercise Plan Access Code: [email or web address] + Walking program: walking with family member for 5 min and gradually progressing it to 30 min    Consulted and Agree with Plan of Care Patient           Patient will benefit from skilled therapeutic intervention in order to improve the following deficits and impairments:  Abnormal gait,Decreased activity tolerance,Decreased mobility,Decreased endurance,Difficulty walking,Impaired sensation,Impaired vision/preception,Postural dysfunction  Visit Diagnosis: Muscle weakness (generalized)  Unsteadiness on feet  Other lack of coordination  Other abnormalities of gait and mobility     Problem List Patient Active Problem List   Diagnosis Date Noted  . Aortic calcification (HCC) 02/22/2020  . Carotid artery calcification, bilateral 02/22/2020  . MDD (major depressive disorder), recurrent episode ([facility])   . GERD (gastroesophageal reflux disease)   . Ischemic stroke (HCC), occipital   . Diabetic polyneuropathy associated with type 2 diabetes mellitus (HCC) 06/30/2018  . Erectile dysfunction due to type 2 diabetes mellitus (HCC) 06/30/2018  . Circulation disorder of lower extremity 05/31/2015  . IBS (irritable bowel syndrome) 12/24/2014  . 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 Diabetic leg ulcer (HCC) 07/18/2013  . Restless legs syndrome (RLS) 11/20/2012  . Generalized anxiety disorder   . Type 2 diabetes mellitus with vascular disease (HCC)   . Hyperlipidemia LDL goal <70 01/16/2009  .  TOBACCO ABUSE 07/27/2007  . Morbid obesity (HCC) 09/29/2006  . Essential hypertension 09/29/2006  . CALCULUS, KIDNEY 05/30/2002    [facility] April Ma L [place] PT, DPT 05/15/2020, 11:22 AM  [facility] [address] [place], [place], [address] Phone: [phone]   Fax:  [phone]  Name: [name] [name] MRN: [id number] Date of Birth: April 14, 1961

## 2020-05-15 NOTE — Therapy (Signed)
[facility] [address] [place], [place], [address] Phone: [phone]   Fax:  [phone]  Speech Language Pathology Treatment  Patient Details  Name: [name] [name] MRN: [id number] Date of Birth: 1960-10-30 Referring Provider (SLP): Bonnielee Haff (referring); [name], [name] (PCP/documentation)   Encounter Date: 05/15/2020   End of Session - 05/15/20 1422    Visit Number 16    Number of Visits 22    Date for SLP Re-Evaluation 07/10/20    SLP Start Time 1103    SLP Stop Time  78    SLP Time Calculation (min) 45 min           Past Medical History:  Diagnosis Date   Angio-edema    Anxiety    Depression    Diabetes mellitus type II    type 2   GERD (gastroesophageal reflux disease)    Headache    migraine   History of kidney stones    HLD (hyperlipidemia)    HTN (hypertension)    Morbid obesity (HCC)    Restless legs syndrome (RLS) 11/20/2012   Stroke ([place]) 02/14/2020   ischemic/right sided affected   Tobacco abuse    Ulcer of left ankle (HCC)    last 2 months dime size dry dressing changing q day   Urticaria     Past Surgical History:  Procedure Laterality Date   Arm surgery Right 1969   Abscess excision and debridement at elbow   COLONOSCOPY WITH PROPOFOL N/A 09/06/2016   Procedure: COLONOSCOPY WITH PROPOFOL;  Surgeon: Doran Stabler, MD;  Location: [facility];  Service: Gastroenterology;  Laterality: N/A;   I & D EXTREMITY Left 05/29/2014   Procedure: IRRIGATION AND DEBRIDEMENT OF LEFT LEG WOUND AND PLACEMENT OF  INTEGRA  AND  VAC;  Surgeon: Theodoro Kos, DO;  Location: [facility];  Service: Plastics;  Laterality: Left;   implantable loop recorder implant  03/06/2020   Medtronic Reveal [place] model LNQ11 (SN  [id number]) implanted by Dr Rayann Heman for cryptogenic stroke   INCISION AND DRAINAGE OF WOUND Left 04/23/2014   Procedure: IRRIGATION AND DEBRIDEMENT  LOWER LEFT LEG WOUND WITH PLACEMENT OF INTEGRA AND VAC;  Surgeon: Theodoro Kos, DO;  Location: [facility];  Service: Plastics;  Laterality: Left;   kidney stone removal  7824   MINOR APPLICATION OF WOUND VAC Left 04/23/2014   Procedure: MINOR APPLICATION OF WOUND VAC;  Surgeon: Theodoro Kos, DO;  Location: [facility];  Service: Plastics;  Laterality: Left;   SKIN SPLIT GRAFT Left 06/18/2014   Procedure: SKIN GRAFT SPLIT THICKNESS TO LOWER LEFT LEG WOUND WITH PLACEMENT OF VAC;  Surgeon: Theodoro Kos, DO;  Location: [facility];  Service: Plastics;  Laterality: Left;   VASECTOMY  1994    There were no vitals filed for this visit.   Subjective Assessment - 05/15/20 1406    Subjective "She's waiting in the car" re: spouse    Patient is accompained by: --   spoke to spouse, [name] [name] via phone during session   Currently in Pain? No/denies                 ADULT SLP TREATMENT - 05/15/20 1407      General Information   Behavior/Cognition Alert;Cooperative;Pleasant mood      Cognitive-Linquistic Treatment   Treatment focused on Cognition;Aphasia;Patient/family/caregiver education    Skilled Treatment Reviewed goals with pt and spouse (via phone). [name] [name] noted that vebal and written  expression is terse and lacking details. [name] [name] has progressed and is successful in simple to mildly complex conversation. [name] [name] is solving simple problems in cooking, financial management (with supervision) but not complex tasks. Discussed strategies/activities that [name] [name] can to (with supervision) to target this. Reading comprehension continues to be difficulty for pt at sentence level. Educated [name] [name] and [name] [name] on strategies to assist reading comprehension vs reading aloud. In complex conversation in a relevant subject (space travel) [name] [name] had significant halting and noted difficulty "getting out what I want to say" He benefitted from questioning cues and semantic cues. I recommend  continued ST, continuing goals for auditory  and reading comprehension, problem solving and written and verbal expression      Assessment / Recommendations / Plan   Plan Goals updated      Progression Toward Goals   Progression toward goals --   goals updated           SLP Education - 05/15/20 1414    Education Details advocate for the time you need to communicate; compensations for reading,    Person(s) Educated Patient    Methods Explanation;Verbal cues;Handout    Comprehension Verbalized understanding;Returned demonstration;Verbal cues required;Need further instruction            SLP Short Term Goals - 05/15/20 1419      SLP SHORT TERM GOAL #1   Title Patient will participate functionally in 8 minutes simple-mod complex conversation using compensations for anomia as necessary, x3 visits.    Baseline 03/31/20 04/02/20    Time 1    Period Weeks    Status Partially Met      SLP SHORT TERM GOAL #2   Title Pt will demonstrate auditory comprehension of 5 minutes simple-mod complex material by answering [place] questions >90% accuracy x 2 sessions.    Baseline 04/02/20    Time 1    Period Weeks    Status Partially Met      SLP SHORT TERM GOAL #3   Title Pt will participate in assessment of reading, writing and cognition with goals added PRN    Baseline writing assessed 03/12/20; allowing more time for assessment due to anxiety    Time 1    Period Weeks    Status Partially Met            SLP Long Term Goals - 05/15/20 1419      SLP LONG TERM GOAL #1   Title Patient will participate functionally in 15 minutes mod complex-complex conversation using compensations as necessary x 3 visits.    Time 1    Period Weeks    Status Partially Met      SLP LONG TERM GOAL #2   Title Patient will demonstrate auditory comprehension of 15 minutes mod complex content (conversation, podcast, radio program, etc) x 3 visits by answering detailed questions >90% accuracy.    Baseline 05/08/20     Time 8    Period Weeks    Status On-going      SLP LONG TERM GOAL #3   Title Patient will report using accessibility features to read and reply to text messages x 3 visits.    Time 3    Period Weeks    Status Achieved      SLP LONG TERM GOAL #4   Title Pt will use compensations for processing mod complex written/verbal information with mod I x 2 sessions.    Time 1    Period Weeks    Status Not  Met      SLP LONG TERM GOAL #5   Title Pt will complete mod complex functional problem solving activities >90% accuracy with extra time.    Time 8    Period Weeks    Status On-going      Additional Long Term Goals   Additional Long Term Goals Yes      SLP LONG TERM GOAL #6   Title Pt will read and comprehend 3-4 paragraph with 85% accuracy and occasional min A with extended time    Time 8    Period Weeks    Status New      SLP LONG TERM GOAL #7   Title Pt will write 3-4 sentence paragraph, ID and correct 70% of errors with occasional min A    Time 8    Period Weeks    Status New            Plan - 05/15/20 1415    Clinical Impression Statement [name] [name] continues to present with mild verbal expressive aphasia, moderate reading comprehension  mild written expression impairments.  He ID's 70% of written errors at word and phrase level and requires frequent min  A to correct written errors. Reading comprehension consistently requires significant extended time at phrase/short sentence level. Mild cognitive impairments of attention, memory and processing persist. [name] [name] and his family carryover strategies to compensate for cognitive impairments, however [name] [name] is relying on [name] [name] to solve more complex functional tasks and communication interaction. . While [name] [name]'s verbal expression is functional in a simple conversation, moderately complex conversatoin continues to require extended time, verbal cues and quesitoning cues  with significant halting for thought organizatoinand word finding difficulties.  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 He is motivated and works diligently on tasks outside of therpy. Continue skilled ST to maximize safety, communication and cognition. Goals updates. Recertification submitted    Speech Therapy Frequency 2x / week    Duration --   16 weels or 32 visits   Treatment/Interventions Language facilitation;Environmental controls;Cueing hierarchy;SLP instruction and feedback;Cognitive reorganization;Compensatory techniques;Functional tasks;Compensatory strategies;Internal/external aids;Multimodal communcation approach;Patient/family education    Potential to Achieve Goals Good           Patient will benefit from skilled therapeutic intervention in order to improve the following deficits and impairments:   Aphasia  Cognitive communication deficit    Problem List Patient Active Problem List   Diagnosis Date Noted   Aortic calcification ([facility]) 02/22/2020   Carotid artery calcification, bilateral 02/22/2020   MDD (major depressive disorder), recurrent episode ([facility])    GERD (gastroesophageal reflux disease)    Ischemic stroke ([facility]), occipital    Diabetic polyneuropathy associated with type 2 diabetes mellitus ([place]) 06/30/2018   Erectile dysfunction due to type 2 diabetes mellitus ([facility]) 06/30/2018   Circulation disorder of lower extremity 05/31/2015   IBS (irritable bowel syndrome) 12/24/2014   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 [place], [place], [address] Phone: [phone]   Fax:  [phone]   Name: [name] [name] MRN: [id number] Date of Birth: February 23, 1961

## 2020-05-15 NOTE — Patient Instructions (Signed)
   [name] [name] can underline the 1st word or 2 words if they get to hard  This will get too hard. Stop when you need to  Cover the dinos with masking tape or something to reduce the visual clutter   Focus and Read app and card may be helpful  For HW - Research [employer]'s trip to space - write 3-4 sentence paragraph giving details about the trip.   Great job giving them the finger and asking for more time - it's also OK to ask for the word as well if you are really tired or frustrated  Acquired Alexia from stroke- look it up  Reading for comprehension is different than reading aloud

## 2020-05-19 ENCOUNTER — Ambulatory Visit: Payer: Managed Care, Other (non HMO) | Admitting: Occupational Therapy

## 2020-05-19 ENCOUNTER — Other Ambulatory Visit: Payer: Self-pay

## 2020-05-19 DIAGNOSIS — I69318 Other symptoms and signs involving cognitive functions following cerebral infarction: Secondary | ICD-10-CM

## 2020-05-19 NOTE — Patient Instructions (Signed)
   [employer] has a free trial for a moth that you may want to consider for some cognitive games. Keep in mind some of these may be difficult or unable to do because of the aphasia.

## 2020-05-19 NOTE — Therapy (Signed)
[facility] [address] [place], [place], [address] Phone: [phone]   Fax:  [phone]  Occupational Therapy Treatment  Patient Details  Name: [name] [name] MRN: [id number] Date of Birth: [birth year] Referring Provider (OT): Dr. Erlinda Hong   Encounter Date: 05/19/2020   OT End of Session - 05/19/20 1126    Visit Number 15    Number of Visits 24    Date for OT Re-Evaluation 06/17/20    Authorization Type Cigna/Cigna managed    Authorization Time Period 60 days    OT Start Time 1103    OT Stop Time 1145    OT Time Calculation (min) 42 min    Activity Tolerance Patient tolerated treatment well;Other (comment)           Past Medical History:  Diagnosis Date  . Angio-edema   . Anxiety   . Depression   . Diabetes mellitus type II    type 2  . GERD (gastroesophageal reflux disease)   . Headache    migraine  . History of kidney stones   . HLD (hyperlipidemia)   . HTN (hypertension)   . Morbid obesity ([facility])   . Restless legs syndrome (RLS) 11/20/2012  . Stroke ([facility]) 02/14/2020   ischemic/right sided affected  . Tobacco abuse   . Ulcer of left ankle (HCC)    last 2 months dime size dry dressing changing q day  . Urticaria     Past Surgical History:  Procedure Laterality Date  . Arm surgery Right 1969   Abscess excision and debridement at elbow  . COLONOSCOPY WITH PROPOFOL N/A 09/06/2016   Procedure: COLONOSCOPY WITH PROPOFOL;  Surgeon: Doran Stabler, MD;  Location: [facility];  Service: Gastroenterology;  Laterality: N/A;  . I & D EXTREMITY Left 05/29/2014   Procedure: IRRIGATION AND DEBRIDEMENT OF LEFT LEG WOUND AND PLACEMENT OF  INTEGRA  AND  VAC;  Surgeon: Theodoro Kos, DO;  Location: [facility];  Service: Plastics;  Laterality: Left;  . implantable loop recorder implant  03/06/2020   Medtronic Reveal [place] model LNQ11 (SN  [id number]) implanted by Dr Rayann Heman for cryptogenic stroke  .  INCISION AND DRAINAGE OF WOUND Left 04/23/2014   Procedure: IRRIGATION AND DEBRIDEMENT LOWER LEFT LEG WOUND WITH PLACEMENT OF INTEGRA AND VAC;  Surgeon: Theodoro Kos, DO;  Location: [facility];  Service: Plastics;  Laterality: Left;  . kidney stone removal  2004  . MINOR APPLICATION OF WOUND VAC Left 04/23/2014   Procedure: MINOR APPLICATION OF WOUND VAC;  Surgeon: Theodoro Kos, DO;  Location: [facility];  Service: Plastics;  Laterality: Left;  . SKIN SPLIT GRAFT Left 06/18/2014   Procedure: SKIN GRAFT SPLIT THICKNESS TO LOWER LEFT LEG WOUND WITH PLACEMENT OF VAC;  Surgeon: Theodoro Kos, DO;  Location: [facility];  Service: Plastics;  Laterality: Left;  [facility] VASECTOMY  1994    There were no vitals filed for this visit.   Subjective Assessment - 05/19/20 1106    Patient is accompanied by: Family member   wife   Pertinent History CVA 02/14/20 Lt occipital lobe. PMH: 2 small previous strokes, DM, GERD, HLD, HTN (but now has issues w/ orthostasis)    Limitations no driving, loop recorder    Currently in Pain? No/denies           Pt on Constant Therapy:  "Count Money" level 4 (highest level) with 80% accuracy "Everyday Math" level 1 with 70% accuracy "  Putting steps in order" with 92% accuracy                     OT Education - 05/19/20 1128    Education Details luminosity info    Person(s) Educated Patient    Methods Explanation    Comprehension Verbalized understanding            OT Short Term Goals - 05/14/20 1431      OT SHORT TERM GOAL #1   Title Pt will be independent with HEP for Rt hand coordination and Rt shoulder (low range strengthening)    Time 4    Period Weeks    Status Achieved   MET for coordination     OT SHORT TERM GOAL #2   Title Pt will be independent with visual scanning strategies to compensate for Rt visual field cut    Time 4    Period Weeks    Status Achieved      OT SHORT TERM GOAL #3    Title Improve coordination Rt hand to 28 sec or less for high level tasks    Baseline 33.91 sec    Time 4    Period Weeks    Status Achieved   25.56 sec     OT SHORT TERM GOAL #4   Title Pt will verbalize understanding with memory compensatory strategies and utilize for medication and financial management    Time 4    Period Weeks    Status Achieved      OT SHORT TERM GOAL #5   Title Pt will perform simple financial management tasks using calculator prn with supervision and no more than min v.c's    Time 4    Period Weeks    Status Achieved   min cues to copy numbers correctly from calculator to paper     OT SHORT TERM GOAL #6   Title Pt will perform simple stovetop task with supervision only    Time 4    Period Weeks    Status Partially Met   assist for cutting or to take something off stove or out of oven for safety d/t vision, supervision and cues required otherwise     OT SHORT TERM GOAL #7   Title Pt will perform tabletop scanning with 100% accuracy and environmental scanning with 75% or greater accuracy    Time 4    Period Weeks    Status Partially Met   met w/ environmental scanning (92%), but inconsistent with tabletop scanning - approx 80%            OT Long Term Goals - 05/14/20 1433      OT LONG TERM GOAL #1   Title Pt to return to cooking with direct supervision    Time 8    Period Weeks    Status Revised      OT LONG TERM GOAL #2   Title Pt will complete meal plan for the week and develop corresponding grocery list I'ly    Time 8    Period Weeks    Status On-going      OT LONG TERM GOAL #3   Title Pt will perform full financial management tasks at 100% accuracy with no more than min cues    Time 8    Period Weeks    Status Revised      OT LONG TERM GOAL #4   Title Pt will demo organizational and problem solving skills to complete  and adapt a schedule prn with no more than min cues    Time 8    Period Weeks    Status Revised      OT LONG  TERM GOAL #5   Title Pt will perform environmental scanning with 90% or greater accuracy consistently    Time 8    Period Weeks    Status Revised                 Plan - 05/19/20 1228    Clinical Impression Statement Pt progressing towards goals. Pt limited w/ reading activities due to aphasia. Pt continues to demo decreased processing but overall has made gradual gains with this.    Occupational performance deficits (Please refer to evaluation for details): ADL's;IADL's;Work;Leisure    Body Structure / Function / Physical Skills ADL;Strength;Balance;UE functional use;IADL;Vision;Coordination;Mobility;Sensation;FMC    Cognitive Skills Memory;Perception;Problem Solve;Sequencing;Thought    Rehab Potential Good    Comorbidities Affecting Occupational Performance: May have comorbidities impacting occupational performance    OT Frequency 2x / week    OT Duration --   for 5 additional weeks   OT Treatment/Interventions Self-care/ADL training;DME and/or AE instruction;Therapeutic activities;Therapeutic exercise;Cognitive remediation/compensation;Coping strategies training;Functional Mobility Training;Neuromuscular education;Visual/perceptual remediation/compensation;Patient/family education    Plan continue progress towards LTG's    Consulted and Agree with Plan of Care Patient;Family member/caregiver    Family Member Consulted wife           Patient will benefit from skilled therapeutic intervention in order to improve the following deficits and impairments:   Body Structure / Function / Physical Skills: ADL,Strength,Balance,UE functional use,IADL,Vision,Coordination,Mobility,Sensation,FMC Cognitive Skills: Memory,Perception,Problem Solve,Sequencing,Thought     Visit Diagnosis: Other symptoms and signs involving cognitive functions following cerebral infarction    Problem List Patient Active Problem List   Diagnosis Date Noted  . Aortic calcification ([facility]) 02/22/2020  .  Carotid artery calcification, bilateral 02/22/2020  . MDD (major depressive disorder), recurrent episode ([facility])   . GERD (gastroesophageal reflux disease)   . Ischemic stroke ([facility]), occipital   . Diabetic polyneuropathy associated with type 2 diabetes mellitus ([facility]) 06/30/2018  . Erectile dysfunction due to type 2 diabetes mellitus ([facility]) 06/30/2018  . Circulation disorder of lower extremity 05/31/2015  . IBS (irritable bowel syndrome) 12/24/2014  . 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 Diabetic leg ulcer ([facility]) 07/18/2013  . Restless legs syndrome (RLS) 11/20/2012  . Generalized anxiety disorder   . Type 2 diabetes mellitus with vascular disease ([facility])   . Hyperlipidemia LDL goal <70 01/16/2009  . TOBACCO ABUSE 07/27/2007  . Morbid obesity ([facility]) 09/29/2006  . Essential hypertension 09/29/2006  . CALCULUS, KIDNEY 05/30/2002    Carey Bullocks, OTR/L 05/19/2020, 12:32 PM  [facility] [address] [place], [place], [address] Phone: [phone]   Fax:  [phone]  Name: [name] [name] MRN: [id number] Date of Birth: Aug 24, 1960

## 2020-05-20 ENCOUNTER — Other Ambulatory Visit: Payer: Self-pay | Admitting: Endocrinology

## 2020-05-20 DIAGNOSIS — E11622 Type 2 diabetes mellitus with other skin ulcer: Secondary | ICD-10-CM

## 2020-05-20 NOTE — Progress Notes (Signed)
[facility] Summary Report / Loop Recorder 

## 2020-05-20 NOTE — Telephone Encounter (Signed)
Please advise on refill. Last prescribed on 07/01/2019. Last OV on 04/10/2020 and it doe not show if patient is continuing Rx. Next appointment on 07/15/2020

## 2020-05-21 ENCOUNTER — Ambulatory Visit: Payer: Managed Care, Other (non HMO) | Admitting: Occupational Therapy

## 2020-05-21 ENCOUNTER — Ambulatory Visit: Payer: Managed Care, Other (non HMO) | Admitting: Physical Therapy

## 2020-05-22 ENCOUNTER — Other Ambulatory Visit: Payer: Self-pay | Admitting: Family Medicine

## 2020-05-24 ENCOUNTER — Other Ambulatory Visit: Payer: Self-pay | Admitting: Endocrinology

## 2020-05-24 DIAGNOSIS — L97929 Non-pressure chronic ulcer of unspecified part of left lower leg with unspecified severity: Secondary | ICD-10-CM

## 2020-05-24 DIAGNOSIS — E11622 Type 2 diabetes mellitus with other skin ulcer: Secondary | ICD-10-CM

## 2020-05-25 MED ORDER — EPINEPHRINE 0.3 MG/0.3ML IJ SOAJ
INTRAMUSCULAR | 0 refills | Status: DC
Start: 2020-05-25 — End: 2020-07-28

## 2020-05-25 NOTE — Telephone Encounter (Signed)
Pharmacy requests refill on: Epinephrine 0.3 mg/0.3 mL  LAST REFILL: 04/22/2020 (Q-2 each, R-0) LAST OV: 04/22/2020 NEXT OV: 06/22/2020 PHARMACY: [id number] [place], [place]

## 2020-05-26 ENCOUNTER — Encounter: Payer: Self-pay | Admitting: Occupational Therapy

## 2020-05-26 ENCOUNTER — Other Ambulatory Visit: Payer: Self-pay

## 2020-05-26 ENCOUNTER — Telehealth: Payer: Self-pay | Admitting: Endocrinology

## 2020-05-26 ENCOUNTER — Ambulatory Visit: Payer: Managed Care, Other (non HMO) | Admitting: Occupational Therapy

## 2020-05-26 DIAGNOSIS — I69318 Other symptoms and signs involving cognitive functions following cerebral infarction: Secondary | ICD-10-CM | POA: Diagnosis not present

## 2020-05-26 DIAGNOSIS — R278 Other lack of coordination: Secondary | ICD-10-CM

## 2020-05-26 DIAGNOSIS — R41842 Visuospatial deficit: Secondary | ICD-10-CM

## 2020-05-26 DIAGNOSIS — R2681 Unsteadiness on feet: Secondary | ICD-10-CM

## 2020-05-26 DIAGNOSIS — M6281 Muscle weakness (generalized): Secondary | ICD-10-CM

## 2020-05-26 DIAGNOSIS — R2689 Other abnormalities of gait and mobility: Secondary | ICD-10-CM

## 2020-05-26 NOTE — Telephone Encounter (Signed)
Please advise 

## 2020-05-26 NOTE — Telephone Encounter (Signed)
I did ask patient if we could send to another Pharmacy and he said no because there was a shortage in general for the medication no matter which pharmacy it goes to.

## 2020-05-26 NOTE — Telephone Encounter (Signed)
Please see below.

## 2020-05-26 NOTE — Telephone Encounter (Signed)
A pharmacy will always say this, in order to prevent a pt from going to a competitor.  Please send to a different pharmacy.  TY

## 2020-05-26 NOTE — Telephone Encounter (Signed)
Please first try sending to a different pharmacy of pt's choice.

## 2020-05-26 NOTE — Telephone Encounter (Signed)
Patient's wife called stating there is a Geneticist, molecular in [place] and she's asking if we can call in an alternative. She said insurance said as long as its approved by Dr Everardo All he can call in any alternative. Please advise.  Pharmacy -   [facility]

## 2020-05-26 NOTE — Therapy (Signed)
[facility] [address] [place], [place], [address] Phone: [phone]   Fax:  [phone]  Occupational Therapy Treatment  Patient Details  Name: [name] [name] MRN: [id number] Date of Birth: 1960/09/20 Referring Provider (OT): Dr. Erlinda Hong   Encounter Date: 05/26/2020   OT End of Session - 05/26/20 1018    Visit Number 16    Number of Visits 24    Date for OT Re-Evaluation 06/17/20    Authorization Type Cigna/Cigna managed    Authorization Time Period 60 days    OT Start Time 1017    OT Stop Time 1100    OT Time Calculation (min) 43 min    Activity Tolerance Patient tolerated treatment well;Other (comment)           Past Medical History:  Diagnosis Date  . Angio-edema   . Anxiety   . Depression   . Diabetes mellitus type II    type 2  . GERD (gastroesophageal reflux disease)   . Headache    migraine  . History of kidney stones   . HLD (hyperlipidemia)   . HTN (hypertension)   . Morbid obesity ([place])   . Restless legs syndrome (RLS) 11/20/2012  . Stroke ([place]) 02/14/2020   ischemic/right sided affected  . Tobacco abuse   . Ulcer of left ankle (HCC)    last 2 months dime size dry dressing changing q day  . Urticaria     Past Surgical History:  Procedure Laterality Date  . Arm surgery Right 1969   Abscess excision and debridement at elbow  . COLONOSCOPY WITH PROPOFOL N/A 09/06/2016   Procedure: COLONOSCOPY WITH PROPOFOL;  Surgeon: Doran Stabler, MD;  Location: [facility];  Service: Gastroenterology;  Laterality: N/A;  . I & D EXTREMITY Left 05/29/2014   Procedure: IRRIGATION AND DEBRIDEMENT OF LEFT LEG WOUND AND PLACEMENT OF  INTEGRA  AND  VAC;  Surgeon: Theodoro Kos, DO;  Location: [facility];  Service: Plastics;  Laterality: Left;  . implantable loop recorder implant  03/06/2020   Medtronic Reveal [place] model LNQ11 (SN  [id number]) implanted by Dr Rayann Heman for cryptogenic stroke  .  INCISION AND DRAINAGE OF WOUND Left 04/23/2014   Procedure: IRRIGATION AND DEBRIDEMENT LOWER LEFT LEG WOUND WITH PLACEMENT OF INTEGRA AND VAC;  Surgeon: Theodoro Kos, DO;  Location: [facility];  Service: Plastics;  Laterality: Left;  . kidney stone removal  2004  . MINOR APPLICATION OF WOUND VAC Left 04/23/2014   Procedure: MINOR APPLICATION OF WOUND VAC;  Surgeon: Theodoro Kos, DO;  Location: [facility];  Service: Plastics;  Laterality: Left;  . SKIN SPLIT GRAFT Left 06/18/2014   Procedure: SKIN GRAFT SPLIT THICKNESS TO LOWER LEFT LEG WOUND WITH PLACEMENT OF VAC;  Surgeon: Theodoro Kos, DO;  Location: [facility];  Service: Plastics;  Laterality: Left;  Marland Kitchen VASECTOMY  1994    There were no vitals filed for this visit.   Subjective Assessment - 05/26/20 1018    Subjective  Pt denies any pain. Pt does not report any changes.    Pertinent History CVA 02/14/20 Lt occipital lobe. PMH: 2 small previous strokes, DM, GERD, HLD, HTN (but now has issues w/ orthostasis)    Limitations no driving, loop recorder    Currently in Pain? No/denies                        OT Treatments/Exercises (OP) -  05/26/20 1020      Cognitive Exercises   Basic Math Counting Money Level 4 on [facility] with 70% accuracy. Everyday Math Level 1 with 80% accuracy. Pt became tearful as he became unclear about instructions and procedure with the activity. Pt thought he had to calculate basic everyday math in head instead of using provided calculator. Mod to max cueing for procedure and understanding use of calculator this day d/t aphasia      Visual/Perceptual Exercises   Copy this Image PVC    PVC Pt organized pieces first and then assembled figure 8 with 100% accuracy and requiring no additional assistance. Pt complete figure 13 with 100% accuracy and increased time but independently                    OT Short Term Goals - 05/14/20 1431      OT  SHORT TERM GOAL #1   Title Pt will be independent with HEP for Rt hand coordination and Rt shoulder (low range strengthening)    Time 4    Period Weeks    Status Achieved   MET for coordination     OT SHORT TERM GOAL #2   Title Pt will be independent with visual scanning strategies to compensate for Rt visual field cut    Time 4    Period Weeks    Status Achieved      OT SHORT TERM GOAL #3   Title Improve coordination Rt hand to 28 sec or less for high level tasks    Baseline 33.91 sec    Time 4    Period Weeks    Status Achieved   25.56 sec     OT SHORT TERM GOAL #4   Title Pt will verbalize understanding with memory compensatory strategies and utilize for medication and financial management    Time 4    Period Weeks    Status Achieved      OT SHORT TERM GOAL #5   Title Pt will perform simple financial management tasks using calculator prn with supervision and no more than min v.c's    Time 4    Period Weeks    Status Achieved   min cues to copy numbers correctly from calculator to paper     OT SHORT TERM GOAL #6   Title Pt will perform simple stovetop task with supervision only    Time 4    Period Weeks    Status Partially Met   assist for cutting or to take something off stove or out of oven for safety d/t vision, supervision and cues required otherwise     OT SHORT TERM GOAL #7   Title Pt will perform tabletop scanning with 100% accuracy and environmental scanning with 75% or greater accuracy    Time 4    Period Weeks    Status Partially Met   met w/ environmental scanning (92%), but inconsistent with tabletop scanning - approx 80%            OT Long Term Goals - 05/14/20 1433      OT LONG TERM GOAL #1   Title Pt to return to cooking with direct supervision    Time 8    Period Weeks    Status Revised      OT LONG TERM GOAL #2   Title Pt will complete meal plan for the week and develop corresponding grocery list I'ly    Time 8    Period  Weeks    Status  On-going      OT LONG TERM GOAL #3   Title Pt will perform full financial management tasks at 100% accuracy with no more than min cues    Time 8    Period Weeks    Status Revised      OT LONG TERM GOAL #4   Title Pt will demo organizational and problem solving skills to complete and adapt a schedule prn with no more than min cues    Time 8    Period Weeks    Status Revised      OT LONG TERM GOAL #5   Title Pt will perform environmental scanning with 90% or greater accuracy consistently    Time 8    Period Weeks    Status Revised                 Plan - 05/26/20 1032    Clinical Impression Statement Pt is making progress towards goals. Pt with slower processing speed with basic everyday math and counting money this day.    Occupational performance deficits (Please refer to evaluation for details): ADL's;IADL's;Work;Leisure    Body Structure / Function / Physical Skills ADL;Strength;Balance;UE functional use;IADL;Vision;Coordination;Mobility;Sensation;FMC    Cognitive Skills Memory;Perception;Problem Solve;Sequencing;Thought    Rehab Potential Good    Comorbidities Affecting Occupational Performance: May have comorbidities impacting occupational performance    OT Frequency 2x / week    OT Duration --   for 5 additional weeks   OT Treatment/Interventions Self-care/ADL training;DME and/or AE instruction;Therapeutic activities;Therapeutic exercise;Cognitive remediation/compensation;Coping strategies training;Functional Mobility Training;Neuromuscular education;Visual/perceptual remediation/compensation;Patient/family education    Plan continue basic math and functional problem solving, environmental scanning    Consulted and Agree with Plan of Care Patient;Family member/caregiver    Family Member Consulted wife           Patient will benefit from skilled therapeutic intervention in order to improve the following deficits and impairments:   Body Structure / Function /  Physical Skills: ADL,Strength,Balance,UE functional use,IADL,Vision,Coordination,Mobility,Sensation,FMC Cognitive Skills: Memory,Perception,Problem Solve,Sequencing,Thought     Visit Diagnosis: Other symptoms and signs involving cognitive functions following cerebral infarction  Muscle weakness (generalized)  Unsteadiness on feet  Other lack of coordination  Other abnormalities of gait and mobility  Visuospatial deficit    Problem List Patient Active Problem List   Diagnosis Date Noted  . Aortic calcification ([place]) 02/22/2020  . Carotid artery calcification, bilateral 02/22/2020  . MDD (major depressive disorder), recurrent episode ([place])   . GERD (gastroesophageal reflux disease)   . Ischemic stroke ([place]), occipital   . Diabetic polyneuropathy associated with type 2 diabetes mellitus ([place]) 06/30/2018  . Erectile dysfunction due to type 2 diabetes mellitus ([place]) 06/30/2018  . Circulation disorder of lower extremity 05/31/2015  . IBS (irritable bowel syndrome) 12/24/2014  . 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 Diabetic leg ulcer ([place]) 07/18/2013  . Restless legs syndrome (RLS) 11/20/2012  . Generalized anxiety disorder   . Type 2 diabetes mellitus with vascular disease ([place])   . Hyperlipidemia LDL goal <70 01/16/2009  . TOBACCO ABUSE 07/27/2007  . Morbid obesity ([place]) 09/29/2006  . Essential hypertension 09/29/2006  . CALCULUS, KIDNEY 05/30/2002    Zachery Conch MOT, OTR/L  05/26/2020, 10:58 AM  [facility] [address] [place], [place], [address] Phone: [phone]   Fax:  [phone]  Name: [name] [name] MRN: [id number] Date of Birth: 1961/03/29

## 2020-05-27 ENCOUNTER — Ambulatory Visit: Payer: Managed Care, Other (non HMO)

## 2020-05-27 ENCOUNTER — Ambulatory Visit: Payer: Managed Care, Other (non HMO) | Admitting: Speech Pathology

## 2020-05-27 DIAGNOSIS — R2681 Unsteadiness on feet: Secondary | ICD-10-CM

## 2020-05-27 DIAGNOSIS — R41841 Cognitive communication deficit: Secondary | ICD-10-CM

## 2020-05-27 DIAGNOSIS — R2689 Other abnormalities of gait and mobility: Secondary | ICD-10-CM

## 2020-05-27 DIAGNOSIS — M6281 Muscle weakness (generalized): Secondary | ICD-10-CM

## 2020-05-27 DIAGNOSIS — R4701 Aphasia: Secondary | ICD-10-CM

## 2020-05-27 DIAGNOSIS — I69318 Other symptoms and signs involving cognitive functions following cerebral infarction: Secondary | ICD-10-CM | POA: Diagnosis not present

## 2020-05-27 NOTE — Therapy (Signed)
[facility] [address] [place], [place], [address] Phone: [phone]   Fax:  [phone]  Physical Therapy Treatment  Patient Details  Name: [name] LauraHerbert Smoots MRN: [id number] Date of Birth: 1960-06-05 Referring Provider (PT): Dr. Dallas Schimkeopeland   Encounter Date: 05/27/2020   PT End of Session - 05/27/20 1405    Visit Number 12    Number of Visits 17    Date for PT Re-Evaluation 06/16/20    Authorization Type Cigna Managed    Progress Note Due on Visit 9    PT Start Time 1402    PT Stop Time 1445    PT Time Calculation (min) 43 min    Equipment Utilized During Treatment Gait belt    Activity Tolerance Patient tolerated treatment well    Behavior During Therapy [facility] for tasks assessed/performed           Past Medical History:  Diagnosis Date  . Angio-edema   . Anxiety   . Depression   . Diabetes mellitus type II    type 2  . GERD (gastroesophageal reflux disease)   . Headache    migraine  . History of kidney stones   . HLD (hyperlipidemia)   . HTN (hypertension)   . Morbid obesity (HCC)   . Restless legs syndrome (RLS) 11/20/2012  . Stroke (HCC) 02/14/2020   ischemic/right sided affected  . Tobacco abuse   . Ulcer of left ankle (HCC)    last 2 months dime size dry dressing changing q day  . Urticaria     Past Surgical History:  Procedure Laterality Date  . Arm surgery Right 1969   Abscess excision and debridement at elbow  . COLONOSCOPY WITH PROPOFOL N/A 09/06/2016   Procedure: COLONOSCOPY WITH PROPOFOL;  Surgeon: Sherrilyn RistHenry L Danis III, MD;  Location: [facility];  Service: Gastroenterology;  Laterality: N/A;  . I & D EXTREMITY Left 05/29/2014   Procedure: IRRIGATION AND DEBRIDEMENT OF LEFT LEG WOUND AND PLACEMENT OF  INTEGRA  AND  VAC;  Surgeon: Wayland Denislaire Sanger, DO;  Location: [facility];  Service: Plastics;  Laterality: Left;  . implantable loop recorder implant  03/06/2020   Medtronic  Reveal [place] model LNQ11 (SN  [id number]) implanted by Dr Johney FrameAllred for cryptogenic stroke  . INCISION AND DRAINAGE OF WOUND Left 04/23/2014   Procedure: IRRIGATION AND DEBRIDEMENT LOWER LEFT LEG WOUND WITH PLACEMENT OF INTEGRA AND VAC;  Surgeon: Wayland Denislaire Sanger, DO;  Location: [facility];  Service: Plastics;  Laterality: Left;  . kidney stone removal  2004  . MINOR APPLICATION OF WOUND VAC Left 04/23/2014   Procedure: MINOR APPLICATION OF WOUND VAC;  Surgeon: Wayland Denislaire Sanger, DO;  Location: [facility];  Service: Plastics;  Laterality: Left;  . SKIN SPLIT GRAFT Left 06/18/2014   Procedure: SKIN GRAFT SPLIT THICKNESS TO LOWER LEFT LEG WOUND WITH PLACEMENT OF VAC;  Surgeon: Wayland Denislaire Sanger, DO;  Location: [facility];  Service: Plastics;  Laterality: Left;  Marland Kitchen. VASECTOMY  1994    There were no vitals filed for this visit.   Subjective Assessment - 05/27/20 1403    Subjective No new changes. Feeling low energy level today. Patient reports the floaters are worse than usual.    Patient is accompained by: Family member    Pertinent History hypertension, hyperlipidemia, type 2 diabetes mellitus, restless leg syndrome, diabetic neuropathy, morbid obesity, tobacco abuse, depression/anxiety, GERD, chronic venous stasis ulcer in bilateral lower extremities    Limitations  Reading;Walking;House hold activities    How long can you sit comfortably? no issues    How long can you stand comfortably? 10 min    How long can you walk comfortably? 5 min    Diagnostic tests CT head showed Hyperdense right MCA.  CTA head and neck and perfusion study showed 10 cc region of completed infarction in left occipital cortex and additional 11 cc at risk/penumbra. [name] (wife) reports that short term memory is affected.    Patient Stated Goals improve walking    Currently in Pain? No/denies    Pain Onset More than a month ago                [facility] Adult PT Treatment/Exercise - 05/27/20  0001      Ambulation/Gait   Ambulation/Gait Yes    Ambulation/Gait Assistance 5: Supervision    Ambulation/Gait Assistance Details Completed ambulation outdoors unlevel surfaces without AD. With ambulation completed with addition of horizontal/vertical head turns, sudden stops, 180 deg turns to change direction, and backwards walking. increaed challenge with backwards walking with slow gait speed noted. CGA throughout with completion. Intermittent standing rest breaks required.    Ambulation Distance (Feet) 600 Feet    Assistive device None    Gait Pattern Step-through pattern    Ambulation Surface Level;Indoor    Curb 5: Supervision    Curb Details (indicate cue type and reason) Completed curb ascend/descend outdoors without AD x 3 reps. Verbal cues for sequencing. Supervision throughout.      High Level Balance   High Level Balance Activities Negotiating over obstacles    High Level Balance Comments Completed ambulation with negotiating over orange hurdles x 4 hurdles x 2 sets down and back. verbal cues to avoid changing gait pattern to promote improved reciprocal stepping. Close supervision with completion.      Exercises   Exercises Knee/Hip      Knee/Hip Exercises: Aerobic   Other Aerobic Completed SciFit on Level 2.5 x 5 minutes with BLE only for improved strengthening/endurance. Patient tolerating well.      Knee/Hip Exercises: Machines for Strengthening   Cybex Leg Press Completed leg press with BLE, 80# x 10 reps, rest break x 1 minute followed by 10 reps x 90#. Verbal cues to avoid locking into knee extension and keeping soft bend in knees.      Knee/Hip Exercises: Standing   Other Standing Knee Exercises Completed hamstring curl with 3 second isometric hold, 2 x 10 reps bilaterally, no weight added due to patient reporting increased skin issues with weight.                    PT Short Term Goals - 03/25/20 1131      PT SHORT TERM GOAL #1   Title patient will be  able to walk with or without AD for 10 min to improve walking endurance    Baseline <5 min without AD, 12 minutes with SPC    Time 4    Period Weeks    Status Achieved    Target Date 03/24/20             PT Long Term Goals - 05/15/20 1120      PT LONG TERM GOAL #1   Title Pt will be able to ambulate for 30 min with or without AD to improve walking endurance (TARGET DATE 06/16/20)    Baseline <5 min without AD; able to amb 30 min with rest breaks (04/21/20)  Time 8    Period Weeks    Status Revised      PT LONG TERM GOAL #2   Title Pt will demo 12 sec or less with 5x/ sit to stand without HHA to improve functional strength (TARGET DATE 06/16/20)    Baseline 15 sec without HHA; 13.41 sec (04/21/20)    Time 8    Period Weeks    Status Revised      PT LONG TERM GOAL #3   Title Pt will increase DGI score to at least 20/24 to demo decreased fall risk (TARGET DATE 06/16/20)    Baseline 17/24 on 04/21/20    Time 8    Period Weeks    Status New      PT LONG TERM GOAL #4   Title Pt and family would like pt to be able to squat at least 25# to demo improved bilat LE and trunk strength (TARGET DATE 06/16/20)    Time 8    Period Weeks    Status New      PT LONG TERM GOAL #5   Title Pt would like to be able to ambulate 6 min at least 581 ft without a/d to demo improved endurance and community mobility (TARGET DATE 06/16/20)    Baseline 481' on 6 MWT with use of SPC (04/21/20)    Time 8    Period Weeks    Status New      Additional Long Term Goals   Additional Long Term Goals Yes      PT LONG TERM GOAL #6   Title Pt will have improved FOTO score to at least 66% (TARGET DATE 06/16/20)    Baseline 45% at eval    Time 4    Period Weeks    Status New    Target Date 06/16/20                 Plan - 05/27/20 1502    Clinical Impression Statement Today's skilled PT session included continued gait training on outdoor surfaces working on improved endurance and high level  balance including sudden stops, backwards walking and head turns. Continued strengthening activites as tolerated today, able to increase resistance with leg press to #90 wih no increase in pain. Will continue to progress toward all LTGs.    Personal Factors and Comorbidities Comorbidity 3+;Time since onset of injury/illness/exacerbation    Comorbidities hypertension, hyperlipidemia, type 2 diabetes mellitus, restless leg syndrome, diabetic neuropathy, morbid obesity, tobacco abuse, depression/anxiety, GERD, chronic venous stasis ulcer in bilateral lower extremities    Examination-Activity Limitations Squat;Stand;Stairs    Examination-Participation Restrictions Community Activity;Yard Work;Shop    Rehab Potential Good    PT Frequency 1x / week    PT Duration 8 weeks    PT Treatment/Interventions ADLs/Self Care Home Management;Gait training;Stair training;Functional mobility training;Therapeutic activities;Therapeutic exercise;Balance training;Neuromuscular re-education;Patient/family education;Manual techniques;Passive range of motion;Energy conservation;Joint Manipulations    PT Next Visit Plan How are strengthening exercises? potentially progress to green tband? Walking outside for endurance. Continue exercises for BLE strengthening (consider increasing quad and hamstring strengthening). Consider ambulating on compliant surfaces and different levels.    PT Home Exercise Plan Access Code: [email or web address] + Walking program: walking with family member for 5 min and gradually progressing it to 30 min    Consulted and Agree with Plan of Care Patient           Patient will benefit from skilled therapeutic intervention in order to improve the following deficits and impairments:  Abnormal gait,Decreased activity tolerance,Decreased mobility,Decreased endurance,Difficulty walking,Impaired sensation,Impaired vision/preception,Postural dysfunction  Visit Diagnosis: Muscle weakness (generalized)  Unsteadiness on  feet  Other abnormalities of gait and mobility     Problem List Patient Active Problem List   Diagnosis Date Noted  . Aortic calcification (HCC) 02/22/2020  . Carotid artery calcification, bilateral 02/22/2020  . MDD (major depressive disorder), recurrent episode (HCC)   . GERD (gastroesophageal reflux disease)   . Ischemic stroke (HCC), occipital   . Diabetic polyneuropathy associated with type 2 diabetes mellitus (HCC) 06/30/2018  . Erectile dysfunction due to type 2 diabetes mellitus (HCC) 06/30/2018  . Circulation disorder of lower extremity 05/31/2015  . IBS (irritable bowel syndrome) 12/24/2014  . 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 Diabetic leg ulcer (HCC) 07/18/2013  . Restless legs syndrome (RLS) 11/20/2012  . Generalized anxiety disorder   . Type 2 diabetes mellitus with vascular disease (HCC)   . Hyperlipidemia LDL goal <70 01/16/2009  . TOBACCO ABUSE 07/27/2007  . Morbid obesity (HCC) 09/29/2006  . Essential hypertension 09/29/2006  . CALCULUS, KIDNEY 05/30/2002    Tempie Donning, PT, DPT 05/27/2020, 3:05 PM  [facility] [address] [place], [place], [address] Phone: [phone]   Fax:  [phone]  Name: Mumin Denomme MRN: [id number] Date of Birth: 1960-06-22

## 2020-05-27 NOTE — Therapy (Signed)
[facility] [address] [place], [place], [address] Phone: [phone]   Fax:  [phone]  Speech Language Pathology Treatment  Patient Details  Name: [name] [name] MRN: [id number] Date of Birth: 1960-07-13 Referring Provider (SLP): Bonnielee Haff (referring); Copland, Frederico Hamman (PCP/documentation)   Encounter Date: 05/27/2020   End of Session - 05/27/20 1524    Visit Number 17    Number of Visits 32    Date for SLP Re-Evaluation 07/10/20    SLP Start Time 1447    SLP Stop Time  5093    SLP Time Calculation (min) 43 min    Activity Tolerance Patient tolerated treatment well           Past Medical History:  Diagnosis Date  . Angio-edema   . Anxiety   . Depression   . Diabetes mellitus type II    type 2  . GERD (gastroesophageal reflux disease)   . Headache    migraine  . History of kidney stones   . HLD (hyperlipidemia)   . HTN (hypertension)   . Morbid obesity ([place])   . Restless legs syndrome (RLS) 11/20/2012  . Stroke ([place]) 02/14/2020   ischemic/right sided affected  . Tobacco abuse   . Ulcer of left ankle (HCC)    last 2 months dime size dry dressing changing q day  . Urticaria     Past Surgical History:  Procedure Laterality Date  . Arm surgery Right 1969   Abscess excision and debridement at elbow  . COLONOSCOPY WITH PROPOFOL N/A 09/06/2016   Procedure: COLONOSCOPY WITH PROPOFOL;  Surgeon: Doran Stabler, MD;  Location: [facility];  Service: Gastroenterology;  Laterality: N/A;  . I & D EXTREMITY Left 05/29/2014   Procedure: IRRIGATION AND DEBRIDEMENT OF LEFT LEG WOUND AND PLACEMENT OF  INTEGRA  AND  VAC;  Surgeon: Theodoro Kos, DO;  Location: [facility];  Service: Plastics;  Laterality: Left;  . implantable loop recorder implant  03/06/2020   Medtronic Reveal [place] model LNQ11 (SN  [id number]) implanted by Dr Rayann Heman for cryptogenic stroke  . INCISION AND DRAINAGE OF WOUND  Left 04/23/2014   Procedure: IRRIGATION AND DEBRIDEMENT LOWER LEFT LEG WOUND WITH PLACEMENT OF INTEGRA AND VAC;  Surgeon: Theodoro Kos, DO;  Location: [facility];  Service: Plastics;  Laterality: Left;  . kidney stone removal  2004  . MINOR APPLICATION OF WOUND VAC Left 04/23/2014   Procedure: MINOR APPLICATION OF WOUND VAC;  Surgeon: Theodoro Kos, DO;  Location: [facility];  Service: Plastics;  Laterality: Left;  . SKIN SPLIT GRAFT Left 06/18/2014   Procedure: SKIN GRAFT SPLIT THICKNESS TO LOWER LEFT LEG WOUND WITH PLACEMENT OF VAC;  Surgeon: Theodoro Kos, DO;  Location: [facility];  Service: Plastics;  Laterality: Left;  Marland Kitchen VASECTOMY  1994    There were no vitals filed for this visit.   Subjective Assessment - 05/27/20 1452    Subjective "We were running late so I forgot my HW"                 ADULT SLP TREATMENT - 05/27/20 1452      General Information   Behavior/Cognition Alert;Cooperative;Pleasant mood      Treatment Provided   Treatment provided Cognitive-Linquistic      Cognitive-Linquistic Treatment   Treatment focused on Cognition;Aphasia;Patient/family/caregiver education    Skilled Treatment Targeted moderately complex sequential word finding with extended time and rare min A. Reading comprehension  targeted in detailed  written instructions (3-4 details per sentence) with extended time and occasional min A.      Assessment / Recommendations / Plan   Plan Continue with current plan of care      Progression Toward Goals   Progression toward goals Progressing toward goals              SLP Short Term Goals - 05/27/20 1524      SLP SHORT TERM GOAL #1   Title Patient will participate functionally in 8 minutes simple-mod complex conversation using compensations for anomia as necessary, x3 visits.    Baseline 03/31/20 04/02/20    Time 1    Period Weeks    Status Partially Met      SLP SHORT TERM GOAL #2   Title  Pt will demonstrate auditory comprehension of 5 minutes simple-mod complex material by answering WH questions >90% accuracy x 2 sessions.    Baseline 04/02/20    Time 1    Period Weeks    Status Partially Met      SLP SHORT TERM GOAL #3   Title Pt will participate in assessment of reading, writing and cognition with goals added PRN    Baseline writing assessed 03/12/20; allowing more time for assessment due to anxiety    Time 1    Period Weeks    Status Partially Met            SLP Long Term Goals - 05/27/20 1524      SLP LONG TERM GOAL #1   Title Patient will participate functionally in 15 minutes mod complex-complex conversation using compensations as necessary x 3 visits.    Time 1    Period Weeks    Status Partially Met      SLP LONG TERM GOAL #2   Title Patient will demonstrate auditory comprehension of 15 minutes mod complex content (conversation, podcast, radio program, etc) x 3 visits by answering detailed questions >90% accuracy.    Baseline 05/08/20    Time 8    Period Weeks    Status On-going      SLP LONG TERM GOAL #3   Title Patient will report using accessibility features to read and reply to text messages x 3 visits.    Time 3    Period Weeks    Status Achieved      SLP LONG TERM GOAL #4   Title Pt will use compensations for processing mod complex written/verbal information with mod I x 2 sessions.    Time 1    Period Weeks    Status Not Met      SLP LONG TERM GOAL #5   Title Pt will complete mod complex functional problem solving activities >90% accuracy with extra time.    Time 8    Period Weeks    Status On-going      SLP LONG TERM GOAL #6   Title Pt will read and comprehend 3-4 paragraph with 85% accuracy and occasional min A with extended time    Time 8    Period Weeks    Status New      SLP LONG TERM GOAL #7   Title Pt will write 3-4 sentence paragraph, ID and correct 70% of errors with occasional min A    Time 8    Period Weeks     Status New            Plan - 05/27/20 1523    Clinical  Impression Statement [name] [name] continues to present with mild verbal expressive aphasia, moderate reading comprehension  mild written expression impairments.  He ID's 70% of written errors at word and phrase level and requires frequent min  A to correct written errors. Reading comprehension consistently requires significant extended time at phrase/short sentence level. Mild cognitive impairments of attention, memory and processing persist. [name] [name] and his family carryover strategies to compensate for cognitive impairments, however [name] [name] is relying on [name] [name] to solve more complex functional tasks and communication interaction. . While [name] [name]'s verbal expression is functional in a simple conversation, moderately complex conversatoin continues to require extended time, verbal cues and quesitoning cues  with significant halting for thought organizatoinand word finding difficulties. 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 He is motivated and works diligently on tasks outside of therpy. Continue skilled ST to maximize safety, communication and cognition. Goals updates. Recertification submitted    Speech Therapy Frequency 2x / week    Duration --   16 weeks or 32 visits   Treatment/Interventions Language facilitation;Environmental controls;Cueing hierarchy;SLP instruction and feedback;Cognitive reorganization;Compensatory techniques;Functional tasks;Compensatory strategies;Internal/external aids;Multimodal communcation approach;Patient/family education    Potential to Achieve Goals Good           Patient will benefit from skilled therapeutic intervention in order to improve the following deficits and impairments:   Aphasia  Cognitive communication deficit    Problem List Patient Active Problem List   Diagnosis Date Noted  . Aortic calcification ([place])  02/22/2020  . Carotid artery calcification, bilateral 02/22/2020  . MDD (major depressive disorder), recurrent episode ([place])   . GERD (gastroesophageal reflux disease)   . Ischemic stroke ([place]), occipital   . Diabetic polyneuropathy associated with type 2 diabetes mellitus ([place]) 06/30/2018  . Erectile dysfunction due to type 2 diabetes mellitus ([place]) 06/30/2018  . Circulation disorder of lower extremity 05/31/2015  . IBS (irritable bowel syndrome) 12/24/2014  . 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 Diabetic leg ulcer ([place]) 07/18/2013  . Restless legs syndrome (RLS) 11/20/2012  . Generalized anxiety disorder   . Type 2 diabetes mellitus with vascular disease ([place])   . Hyperlipidemia LDL goal <70 01/16/2009  . TOBACCO ABUSE 07/27/2007  . Morbid obesity ([place]) 09/29/2006  . Essential hypertension 09/29/2006  . CALCULUS, KIDNEY 05/30/2002    [name] [name] Schools, Annye Rusk MS, CCC-SLP 05/27/2020, 3:39 PM  [facility] [address] [place], [place], [address] Phone: [phone]   Fax:  [phone]   Name: Dylen Mcelhannon MRN: [id number] Date of Birth: [birth year]

## 2020-05-28 ENCOUNTER — Encounter: Payer: Self-pay | Admitting: Psychology

## 2020-05-28 ENCOUNTER — Ambulatory Visit: Payer: Managed Care, Other (non HMO) | Admitting: Occupational Therapy

## 2020-06-01 ENCOUNTER — Ambulatory Visit: Payer: Managed Care, Other (non HMO)

## 2020-06-02 ENCOUNTER — Telehealth: Payer: Self-pay | Admitting: Family Medicine

## 2020-06-03 ENCOUNTER — Ambulatory Visit: Payer: Managed Care, Other (non HMO) | Attending: Internal Medicine | Admitting: Physical Therapy

## 2020-06-03 ENCOUNTER — Encounter: Payer: Self-pay | Admitting: Occupational Therapy

## 2020-06-03 ENCOUNTER — Ambulatory Visit: Payer: Managed Care, Other (non HMO) | Admitting: Occupational Therapy

## 2020-06-03 ENCOUNTER — Other Ambulatory Visit: Payer: Self-pay

## 2020-06-03 ENCOUNTER — Ambulatory Visit: Payer: Managed Care, Other (non HMO)

## 2020-06-03 DIAGNOSIS — R278 Other lack of coordination: Secondary | ICD-10-CM | POA: Diagnosis present

## 2020-06-03 DIAGNOSIS — H53461 Homonymous bilateral field defects, right side: Secondary | ICD-10-CM | POA: Insufficient documentation

## 2020-06-03 DIAGNOSIS — R41841 Cognitive communication deficit: Secondary | ICD-10-CM | POA: Insufficient documentation

## 2020-06-03 DIAGNOSIS — I69318 Other symptoms and signs involving cognitive functions following cerebral infarction: Secondary | ICD-10-CM

## 2020-06-03 DIAGNOSIS — R2689 Other abnormalities of gait and mobility: Secondary | ICD-10-CM | POA: Insufficient documentation

## 2020-06-03 DIAGNOSIS — M6281 Muscle weakness (generalized): Secondary | ICD-10-CM | POA: Insufficient documentation

## 2020-06-03 DIAGNOSIS — R41842 Visuospatial deficit: Secondary | ICD-10-CM | POA: Diagnosis present

## 2020-06-03 DIAGNOSIS — R2681 Unsteadiness on feet: Secondary | ICD-10-CM | POA: Diagnosis present

## 2020-06-03 DIAGNOSIS — R4701 Aphasia: Secondary | ICD-10-CM | POA: Insufficient documentation

## 2020-06-03 NOTE — Therapy (Signed)
[facility] [address] [place], [place], [address] Phone: [phone]   Fax:  [phone]  Physical Therapy Treatment  Patient Details  Name: [name] [name] MRN: [id number] Date of Birth: 1961-05-30 Referring Provider (PT): Dr. Dallas Schimke   Encounter Date: 06/03/2020   PT End of Session - 06/03/20 1318    Visit Number 13    Number of Visits 17    Date for PT Re-Evaluation 06/16/20    Authorization Type [employer] Managed    PT Start Time 1235    PT Stop Time 1320    PT Time Calculation (min) 45 min    Activity Tolerance Patient tolerated treatment well    Behavior During Therapy [facility] for tasks assessed/performed           Past Medical History:  Diagnosis Date  . Angio-edema   . Anxiety   . Depression   . Diabetes mellitus type II    type 2  . GERD (gastroesophageal reflux disease)   . Headache    migraine  . History of kidney stones   . HLD (hyperlipidemia)   . HTN (hypertension)   . Morbid obesity ([facility])   . Restless legs syndrome (RLS) 11/20/2012  . Stroke ([facility]) 02/14/2020   ischemic/right sided affected  . Tobacco abuse   . Ulcer of left ankle (HCC)    last 2 months dime size dry dressing changing q day  . Urticaria     Past Surgical History:  Procedure Laterality Date  . Arm surgery Right 1969   Abscess excision and debridement at elbow  . COLONOSCOPY WITH PROPOFOL N/A 09/06/2016   Procedure: COLONOSCOPY WITH PROPOFOL;  Surgeon: Sherrilyn Rist, MD;  Location: [facility];  Service: Gastroenterology;  Laterality: N/A;  . I & D EXTREMITY Left 05/29/2014   Procedure: IRRIGATION AND DEBRIDEMENT OF LEFT LEG WOUND AND PLACEMENT OF  INTEGRA  AND  VAC;  Surgeon: Wayland Denis, DO;  Location: [facility];  Service: Plastics;  Laterality: Left;  . implantable loop recorder implant  03/06/2020   Medtronic Reveal [place] model LNQ11 (SN  [id number]) implanted by Dr Johney Frame for cryptogenic stroke  .  INCISION AND DRAINAGE OF WOUND Left 04/23/2014   Procedure: IRRIGATION AND DEBRIDEMENT LOWER LEFT LEG WOUND WITH PLACEMENT OF INTEGRA AND VAC;  Surgeon: Wayland Denis, DO;  Location: [facility];  Service: Plastics;  Laterality: Left;  . kidney stone removal  2004  . MINOR APPLICATION OF WOUND VAC Left 04/23/2014   Procedure: MINOR APPLICATION OF WOUND VAC;  Surgeon: Wayland Denis, DO;  Location: [facility];  Service: Plastics;  Laterality: Left;  . SKIN SPLIT GRAFT Left 06/18/2014   Procedure: SKIN GRAFT SPLIT THICKNESS TO LOWER LEFT LEG WOUND WITH PLACEMENT OF VAC;  Surgeon: Wayland Denis, DO;  Location: [facility];  Service: Plastics;  Laterality: Left;  Marland Kitchen VASECTOMY  1994    There were no vitals filed for this visit.   Subjective Assessment - 06/03/20 1239    Subjective Pt states he is to get into [employer]'s aphasia program later this year. Pt is excited to state he went on the zoom call yesterday to talk (public talking is one of his worst fears/anxiety). He reports nervousness but that he was able to do it and was welcomed warmly. Pt states he's been doing good with his walking and exercises.    Patient is accompained by: Family member    Pertinent History hypertension, hyperlipidemia,  type 2 diabetes mellitus, restless leg syndrome, diabetic neuropathy, morbid obesity, tobacco abuse, depression/anxiety, GERD, chronic venous stasis ulcer in bilateral lower extremities    Limitations Reading;Walking;House hold activities    How long can you sit comfortably? no issues    How long can you stand comfortably? 10 min    How long can you walk comfortably? 5 min    Diagnostic tests CT head showed Hyperdense right MCA.  CTA head and neck and perfusion study showed 10 cc region of completed infarction in left occipital cortex and additional 11 cc at risk/penumbra. [name] (wife) reports that short term memory is affected.    Patient Stated Goals improve walking     Currently in Pain? No/denies    Pain Onset More than a month ago              [facility] PT Assessment - 06/03/20 0001      6 minute walk test results    Aerobic Endurance Distance Walked 531    Endurance additional comments no a/d; 3 standing rest breaks due to hips                         [facility] Adult PT Treatment/Exercise - 06/03/20 0001      Ambulation/Gait   Ambulation/Gait Yes    Ambulation/Gait Assistance 5: Supervision    Ambulation/Gait Assistance Details Working on endurance; completed in ~ 9 min    Ambulation Distance (Feet) 803 Feet    Assistive device None    Gait Pattern Step-through pattern    Ambulation Surface Level;Unlevel;Outdoor;Paved      Knee/Hip Exercises: Stretches   Active Hamstring Stretch Both;20 seconds    Other Knee/Hip Stretches Hip circles CW & CCW x 5      Knee/Hip Exercises: Machines for Strengthening   Total Gym Leg Press DL 92# 4Q68      Knee/Hip Exercises: Standing   Other Standing Knee Exercises mini lunges 2x10; last 10 reps with red tband pulling into rotation    Other Standing Knee Exercises Mini deadlift x10 with 5#, x10 with 10#                  PT Education - 06/03/20 1514    Education Details Discussed progressing his HEP at home.    Person(s) Educated Patient    Methods Explanation;Verbal cues    Comprehension Verbalized understanding            PT Short Term Goals - 03/25/20 1131      PT SHORT TERM GOAL #1   Title patient will be able to walk with or without AD for 10 min to improve walking endurance    Baseline <5 min without AD, 12 minutes with SPC    Time 4    Period Weeks    Status Achieved    Target Date 03/24/20             PT Long Term Goals - 05/15/20 1120      PT LONG TERM GOAL #1   Title Pt will be able to ambulate for 30 min with or without AD to improve walking endurance (TARGET DATE 06/16/20)    Baseline <5 min without AD; able to amb 30 min with rest breaks (04/21/20)     Time 8    Period Weeks    Status Revised      PT LONG TERM GOAL #2   Title Pt will demo 12 sec or less  with 5x/ sit to stand without HHA to improve functional strength (TARGET DATE 06/16/20)    Baseline 15 sec without HHA; 13.41 sec (04/21/20)    Time 8    Period Weeks    Status Revised      PT LONG TERM GOAL #3   Title Pt will increase DGI score to at least 20/24 to demo decreased fall risk (TARGET DATE 06/16/20)    Baseline 17/24 on 04/21/20    Time 8    Period Weeks    Status New      PT LONG TERM GOAL #4   Title Pt and family would like pt to be able to squat at least 25# to demo improved bilat LE and trunk strength (TARGET DATE 06/16/20)    Time 8    Period Weeks    Status New      PT LONG TERM GOAL #5   Title Pt would like to be able to ambulate 6 min at least 581 ft without a/d to demo improved endurance and community mobility (TARGET DATE 06/16/20)    Baseline 481' on 6 MWT with use of SPC (04/21/20)    Time 8    Period Weeks    Status New      Additional Long Term Goals   Additional Long Term Goals Yes      PT LONG TERM GOAL #6   Title Pt will have improved FOTO score to at least 66% (TARGET DATE 06/16/20)    Baseline 45% at eval    Time 4    Period Weeks    Status New    Target Date 06/16/20                 Plan - 06/03/20 1517    Clinical Impression Statement Treatment focused on continuing to progress pt's strengthening and endurance. Pt able to amb 531' within 6 minutes without a/d -- only limited due to his hips aching which is nearing his LTG. Progressed pt into deeper deadlifts and mini lunges with good pt tolerance.    Personal Factors and Comorbidities Comorbidity 3+;Time since onset of injury/illness/exacerbation    Comorbidities hypertension, hyperlipidemia, type 2 diabetes mellitus, restless leg syndrome, diabetic neuropathy, morbid obesity, tobacco abuse, depression/anxiety, GERD, chronic venous stasis ulcer in bilateral lower extremities     Examination-Activity Limitations Squat;Stand;Stairs    Examination-Participation Restrictions Community Activity;Yard Work;Shop    Rehab Potential Good    PT Frequency 1x / week    PT Duration 8 weeks    PT Treatment/Interventions ADLs/Self Care Home Management;Gait training;Stair training;Functional mobility training;Therapeutic activities;Therapeutic exercise;Balance training;Neuromuscular re-education;Patient/family education;Manual techniques;Passive range of motion;Energy conservation;Joint Manipulations    PT Next Visit Plan How are strengthening exercises? potentially progress to green tband? Walking outside for endurance. Continue exercises for BLE strengthening (consider increasing quad and hamstring strengthening). Consider ambulating on compliant surfaces and different levels.    PT Home Exercise Plan Access Code: [email or web address] + Walking program: walking with family member for 5 min and gradually progressing it to 30 min    Consulted and Agree with Plan of Care Patient           Patient will benefit from skilled therapeutic intervention in order to improve the following deficits and impairments:  Abnormal gait,Decreased activity tolerance,Decreased mobility,Decreased endurance,Difficulty walking,Impaired sensation,Impaired vision/preception,Postural dysfunction  Visit Diagnosis: Muscle weakness (generalized)  Unsteadiness on feet  Other abnormalities of gait and mobility     Problem List Patient Active Problem List   Diagnosis Date  Noted  . Aortic calcification ([place]) 02/22/2020  . Carotid artery calcification, bilateral 02/22/2020  . MDD (major depressive disorder), recurrent episode ([place])   . GERD (gastroesophageal reflux disease)   . Ischemic stroke ([place]), occipital   . Diabetic polyneuropathy associated with type 2 diabetes mellitus ([place]) 06/30/2018  . Erectile dysfunction due to type 2 diabetes mellitus ([place]) 06/30/2018  . Circulation disorder of lower extremity  05/31/2015  . IBS (irritable bowel syndrome) 12/24/2014  . 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 Diabetic leg ulcer ([place]) 07/18/2013  . Restless legs syndrome (RLS) 11/20/2012  . Generalized anxiety disorder   . Type 2 diabetes mellitus with vascular disease ([place])   . Hyperlipidemia LDL goal <70 01/16/2009  . TOBACCO ABUSE 07/27/2007  . Morbid obesity ([place]) 09/29/2006  . Essential hypertension 09/29/2006  . CALCULUS, KIDNEY 05/30/2002    [facility] [address] [place] PT, DPT 06/03/2020, 3:20 PM  [facility] [address] [place], [place], [address] Phone: [phone]   Fax:  [phone]  Name: [name] [name] MRN: [id number] Date of Birth: [birth year]

## 2020-06-03 NOTE — Therapy (Signed)
[facility] [address] [place], [place], [address] Phone: [phone]   Fax:  [phone]  Occupational Therapy Treatment  Patient Details  Name: [name] [name] MRN: [id number] Date of Birth: Mar 31, 1961 Referring Provider (OT): Dr. Erlinda Hong   Encounter Date: 06/03/2020   OT End of Session - 06/03/20 1334    Visit Number 17    Number of Visits 24    Date for OT Re-Evaluation 06/17/20    Authorization Type Cigna/Cigna managed    Authorization Time Period 60 days    OT Start Time 1320    OT Stop Time 1400    OT Time Calculation (min) 40 min    Activity Tolerance Patient tolerated treatment well;Other (comment)    Behavior During Therapy WFL for tasks assessed/performed           Past Medical History:  Diagnosis Date  . Angio-edema   . Anxiety   . Depression   . Diabetes mellitus type II    type 2  . GERD (gastroesophageal reflux disease)   . Headache    migraine  . History of kidney stones   . HLD (hyperlipidemia)   . HTN (hypertension)   . Morbid obesity ([place])   . Restless legs syndrome (RLS) 11/20/2012  . Stroke ([place]) 02/14/2020   ischemic/right sided affected  . Tobacco abuse   . Ulcer of left ankle (H[facility])    last 2 months dime size dry dressing changing q day  . Urticaria     Past Surgical History:  Procedure Laterality Date  . Arm surgery Right 1969   Abscess excision and debridement at elbow  . COLONOSCOPY WITH PROPOFOL N/A 09/06/2016   Procedure: COLONOSCOPY WITH PROPOFOL;  Surgeon: Doran Stabler, MD;  Location: [facility];  Service: Gastroenterology;  Laterality: N/A;  . I & D EXTREMITY Left 05/29/2014   Procedure: IRRIGATION AND DEBRIDEMENT OF LEFT LEG WOUND AND PLACEMENT OF  INTEGRA  AND  VAC;  Surgeon: Theodoro Kos, DO;  Location: [facility];  Service: Plastics;  Laterality: Left;  . implantable loop recorder implant  03/06/2020   Medtronic Reveal [place] model LNQ11 (SN   [id number]) implanted by Dr Rayann Heman for cryptogenic stroke  . INCISION AND DRAINAGE OF WOUND Left 04/23/2014   Procedure: IRRIGATION AND DEBRIDEMENT LOWER LEFT LEG WOUND WITH PLACEMENT OF INTEGRA AND VAC;  Surgeon: Theodoro Kos, DO;  Location: [facility];  Service: Plastics;  Laterality: Left;  . kidney stone removal  2004  . MINOR APPLICATION OF WOUND VAC Left 04/23/2014   Procedure: MINOR APPLICATION OF WOUND VAC;  Surgeon: Theodoro Kos, DO;  Location: [facility];  Service: Plastics;  Laterality: Left;  . SKIN SPLIT GRAFT Left 06/18/2014   Procedure: SKIN GRAFT SPLIT THICKNESS TO LOWER LEFT LEG WOUND WITH PLACEMENT OF VAC;  Surgeon: Theodoro Kos, DO;  Location: [facility];  Service: Plastics;  Laterality: Left;  Marland Kitchen VASECTOMY  1994    There were no vitals filed for this visit.   Subjective Assessment - 06/03/20 1320    Subjective  Pt denies any pain. Pt does not report any changes.    Pertinent History CVA 02/14/20 Lt occipital lobe. PMH: 2 small previous strokes, DM, GERD, HLD, HTN (but now has issues w/ orthostasis)    Currently in Pain? No/denies                 Treatment: Pt generated dinner meals for 5 days and  there generated a grocery list of items he would need to make the meals. Min v.c for attention to detail and  14/15 located first pass for environmental scanning , pt located remaining  item on second pass.                 OT Short Term Goals - 05/14/20 1431      OT SHORT TERM GOAL #1   Title Pt will be independent with HEP for Rt hand coordination and Rt shoulder (low range strengthening)    Time 4    Period Weeks    Status Achieved   MET for coordination     OT SHORT TERM GOAL #2   Title Pt will be independent with visual scanning strategies to compensate for Rt visual field cut    Time 4    Period Weeks    Status Achieved      OT SHORT TERM GOAL #3   Title Improve coordination Rt hand to 28 sec or  less for high level tasks    Baseline 33.91 sec    Time 4    Period Weeks    Status Achieved   25.56 sec     OT SHORT TERM GOAL #4   Title Pt will verbalize understanding with memory compensatory strategies and utilize for medication and financial management    Time 4    Period Weeks    Status Achieved      OT SHORT TERM GOAL #5   Title Pt will perform simple financial management tasks using calculator prn with supervision and no more than min v.c's    Time 4    Period Weeks    Status Achieved   min cues to copy numbers correctly from calculator to paper     OT SHORT TERM GOAL #6   Title Pt will perform simple stovetop task with supervision only    Time 4    Period Weeks    Status Partially Met   assist for cutting or to take something off stove or out of oven for safety d/t vision, supervision and cues required otherwise     OT SHORT TERM GOAL #7   Title Pt will perform tabletop scanning with 100% accuracy and environmental scanning with 75% or greater accuracy    Time 4    Period Weeks    Status Partially Met   met w/ environmental scanning (92%), but inconsistent with tabletop scanning - approx 80%            OT Long Term Goals - 06/03/20 1320      OT LONG TERM GOAL #1   Title Pt to return to cooking with direct supervision    Time 8    Period Weeks    Status Revised      OT LONG TERM GOAL #2   Title Pt will complete meal plan for the week and develop corresponding grocery list I'ly    Time 8    Period Weeks    Status On-going      OT LONG TERM GOAL #3   Title Pt will perform full financial management tasks at 100% accuracy with no more than min cues    Time 8    Period Weeks    Status Revised      OT LONG TERM GOAL #4   Title Pt will demo organizational and problem solving skills to complete and adapt a schedule prn with no more than min cues  Time 8    Period Weeks    Status Revised      OT LONG TERM GOAL #5   Title Pt will perform environmental  scanning with 90% or greater accuracy consistently    Time 8    Period Weeks    Status Revised                 Plan - 06/03/20 1335    Clinical Impression Statement Pt is progressing towards goals. He requires increased time for processing tasks, however he did a nice job with meal palnning. Pt continues to misspell words which is likely related to aphasia.    Occupational performance deficits (Please refer to evaluation for details): ADL's;IADL's;Work;Leisure    Body Structure / Function / Physical Skills ADL;Strength;Balance;UE functional use;IADL;Vision;Coordination;Mobility;Sensation;FMC    Cognitive Skills Memory;Perception;Problem Solve;Sequencing;Thought    Rehab Potential Good    Comorbidities Affecting Occupational Performance: May have comorbidities impacting occupational performance    OT Frequency 2x / week    OT Duration --   for 5 additional weeks   OT Treatment/Interventions Self-care/ADL training;DME and/or AE instruction;Therapeutic activities;Therapeutic exercise;Cognitive remediation/compensation;Coping strategies training;Functional Mobility Training;Neuromuscular education;Visual/perceptual remediation/compensation;Patient/family education    Plan continue basic math and functional problem solving, environmental scanning    Consulted and Agree with Plan of Care Patient           Patient will benefit from skilled therapeutic intervention in order to improve the following deficits and impairments:   Body Structure / Function / Physical Skills: ADL,Strength,Balance,UE functional use,IADL,Vision,Coordination,Mobility,Sensation,FMC Cognitive Skills: Memory,Perception,Problem Solve,Sequencing,Thought     Visit Diagnosis: Muscle weakness (generalized)  Unsteadiness on feet  Other abnormalities of gait and mobility  Other symptoms and signs involving cognitive functions following cerebral infarction  Other lack of coordination    Problem List Patient  Active Problem List   Diagnosis Date Noted  . Aortic calcification ([place]) 02/22/2020  . Carotid artery calcification, bilateral 02/22/2020  . MDD (major depressive disorder), recurrent episode ([place])   . GERD (gastroesophageal reflux disease)   . Ischemic stroke ([place]), occipital   . Diabetic polyneuropathy associated with type 2 diabetes mellitus ([place]) 06/30/2018  . Erectile dysfunction due to type 2 diabetes mellitus ([place]) 06/30/2018  . Circulation disorder of lower extremity 05/31/2015  . IBS (irritable bowel syndrome) 12/24/2014  . 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 Diabetic leg ulcer ([place]) 07/18/2013  . Restless legs syndrome (RLS) 11/20/2012  . Generalized anxiety disorder   . Type 2 diabetes mellitus with vascular disease ([place])   . Hyperlipidemia LDL goal <70 01/16/2009  . TOBACCO ABUSE 07/27/2007  . Morbid obesity ([place]) 09/29/2006  . Essential hypertension 09/29/2006  . CALCULUS, KIDNEY 05/30/2002    [name] [name] 06/03/2020, 1:37 PM  [facility] [address] [place], [place], [address] Phone: [phone]   Fax:  [phone]  Name: [name] [name] MRN: [id number] Date of Birth: 11-12-1960

## 2020-06-03 NOTE — Therapy (Signed)
[facility] [address] [place], [place], [address] Phone: [phone]   Fax:  [phone]  Speech Language Pathology Treatment  Patient Details  Name: [name] [name] MRN: [id number] Date of Birth: Jan 06, 1961 Referring Provider (SLP): Bonnielee Haff (referring); Copland, Frederico Hamman (PCP/documentation)   Encounter Date: 06/03/2020   End of Session - 06/03/20 1526    Visit Number 18    Number of Visits 32    Date for SLP Re-Evaluation 07/10/20    SLP Start Time 1404    SLP Stop Time  1445    SLP Time Calculation (min) 41 min    Activity Tolerance Patient tolerated treatment well           Past Medical History:  Diagnosis Date  . Angio-edema   . Anxiety   . Depression   . Diabetes mellitus type II    type 2  . GERD (gastroesophageal reflux disease)   . Headache    migraine  . History of kidney stones   . HLD (hyperlipidemia)   . HTN (hypertension)   . Morbid obesity ([place])   . Restless legs syndrome (RLS) 11/20/2012  . Stroke ([place]) 02/14/2020   ischemic/right sided affected  . Tobacco abuse   . Ulcer of left ankle (HCC)    last 2 months dime size dry dressing changing q day  . Urticaria     Past Surgical History:  Procedure Laterality Date  . Arm surgery Right 1969   Abscess excision and debridement at elbow  . COLONOSCOPY WITH PROPOFOL N/A 09/06/2016   Procedure: COLONOSCOPY WITH PROPOFOL;  Surgeon: Doran Stabler, MD;  [facility];  Service: Gastroenterology;  Laterality: N/A;  . I & D EXTREMITY Left 05/29/2014   Procedure: IRRIGATION AND DEBRIDEMENT OF LEFT LEG WOUND AND PLACEMENT OF  INTEGRA  AND  VAC;  Surgeon: Theodoro Kos, DO;  Location: [facility];  Service: Plastics;  Laterality: Left;  . implantable loop recorder implant  03/06/2020   Medtronic Reveal [place] model LNQ11 (SN  [id number]) implanted by Dr Rayann Heman for cryptogenic stroke  . INCISION AND DRAINAGE OF WOUND  Left 04/23/2014   Procedure: IRRIGATION AND DEBRIDEMENT LOWER LEFT LEG WOUND WITH PLACEMENT OF INTEGRA AND VAC;  Surgeon: Theodoro Kos, DO;  Location: [facility];  Service: Plastics;  Laterality: Left;  . kidney stone removal  2004  . MINOR APPLICATION OF WOUND VAC Left 04/23/2014   Procedure: MINOR APPLICATION OF WOUND VAC;  Surgeon: Theodoro Kos, DO;  Location: [facility];  Service: Plastics;  Laterality: Left;  . SKIN SPLIT GRAFT Left 06/18/2014   Procedure: SKIN GRAFT SPLIT THICKNESS TO LOWER LEFT LEG WOUND WITH PLACEMENT OF VAC;  Surgeon: Theodoro Kos, DO;  Location: [facility];  Service: Plastics;  Laterality: Left;  Marland Kitchen VASECTOMY  1994    There were no vitals filed for this visit.   Subjective Assessment - 06/03/20 1415    Subjective "She was in the driveway - so I forgot it."    Currently in Pain? No/denies                 ADULT SLP TREATMENT - 06/03/20 0001      General Information   Behavior/Cognition Alert;Cooperative;Pleasant mood      Treatment Provided   Treatment provided Cognitive-Linquistic      Cognitive-Linquistic Treatment   Treatment focused on Aphasia    Skilled Treatment SLP targeted moderately complex conversation - about aphasia  group meeting, and certain recipes patient likes to cook. Occasional word finding with extended time necessary. and rare min A. Reading comprehension targeted in a recipe given in narrative form, with consistent extended time to decode words, and rare min A for comprehension.      Assessment / Recommendations / Plan   Plan Continue with current plan of care      Progression Toward Goals   Progression toward goals Progressing toward goals              SLP Short Term Goals - 05/27/20 1524      SLP SHORT TERM GOAL #1   Title Patient will participate functionally in 8 minutes simple-mod complex conversation using compensations for anomia as necessary, x3 visits.    Baseline  03/31/20 04/02/20    Time 1    Period Weeks    Status Partially Met      SLP SHORT TERM GOAL #2   Title Pt will demonstrate auditory comprehension of 5 minutes simple-mod complex material by answering [place] questions >90% accuracy x 2 sessions.    Baseline 04/02/20    Time 1    Period Weeks    Status Partially Met      SLP SHORT TERM GOAL #3   Title Pt will participate in assessment of reading, writing and cognition with goals added PRN    Baseline writing assessed 03/12/20; allowing more time for assessment due to anxiety    Time 1    Period Weeks    Status Partially Met            SLP Long Term Goals - 06/03/20 1528      SLP LONG TERM GOAL #1   Title Patient will participate functionally in 15 minutes mod complex-complex conversation using compensations as necessary x 3 visits.    Time 1    Period Weeks    Status Partially Met      SLP LONG TERM GOAL #2   Title Patient will demonstrate auditory comprehension of 15 minutes mod complex content (conversation, podcast, radio program, etc) x 3 visits by answering detailed questions >90% accuracy.    Baseline 05/08/20    Time 7    Period Weeks    Status On-going      SLP LONG TERM GOAL #3   Title Patient will report using accessibility features to read and reply to text messages x 3 visits.    Time 3    Period Weeks    Status Achieved      SLP LONG TERM GOAL #4   Title Pt will use compensations for processing mod complex written/verbal information with mod I x 2 sessions.    Time 1    Period Weeks    Status Not Met      SLP LONG TERM GOAL #5   Title Pt will complete mod complex functional problem solving activities >90% accuracy with extra time.    Time 7    Period Weeks    Status On-going      SLP LONG TERM GOAL #6   Title Pt will read and comprehend 3-4 paragraph with 85% accuracy and occasional min A with extended time    Time 7    Period Weeks    Status On-going      SLP LONG TERM GOAL #7   Title Pt will write  3-4 sentence paragraph, ID and correct 70% of errors with occasional min A    Time 7  Period Weeks    Status On-going            Plan - 06/03/20 1527    Clinical Impression Statement [name] [name] continues to present with mild verbal expressive aphasia, moderate reading comprehension  mild written expression impairments. Reading comprehension consistently requires significant extended time at phrase/short sentence level. Mild cognitive impairments of attention, memory and processing persist. [name] [name] and his family carryover strategies to compensate for cognitive impairments, however [name] [name] is relying on [name] [name] to solve more complex functional tasks and communication interaction. . While [name] [name]'s verbal expression is functional in a simple conversation, moderately complex conversatoin continues to require extended time, verbal cues and quesitoning cues  with significant halting for thought organizatoinand word finding difficulties. 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 He is motivated and works diligently on tasks outside of therpy. Continue skilled ST to maximize safety, communication and cognition. Goals updates. Recertification submitted    Speech Therapy Frequency 2x / week    Duration --   16 weeks or 32 visits   Treatment/Interventions Language facilitation;Environmental controls;Cueing hierarchy;SLP instruction and feedback;Cognitive reorganization;Compensatory techniques;Functional tasks;Compensatory strategies;Internal/external aids;Multimodal communcation approach;Patient/family education    Potential to Achieve Goals Good           Patient will benefit from skilled therapeutic intervention in order to improve the following deficits and impairments:   Aphasia  Cognitive communication deficit    Problem List Patient Active Problem List   Diagnosis Date Noted  . Aortic calcification ([place])  02/22/2020  . Carotid artery calcification, bilateral 02/22/2020  . MDD (major depressive disorder), recurrent episode ([place])   . GERD (gastroesophageal reflux disease)   . Ischemic stroke ([place]), occipital   . Diabetic polyneuropathy associated with type 2 diabetes mellitus ([place]) 06/30/2018  . Erectile dysfunction due to type 2 diabetes mellitus ([place]) 06/30/2018  . Circulation disorder of lower extremity 05/31/2015  . IBS (irritable bowel syndrome) 12/24/2014  . 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 Diabetic leg ulcer ([place]) 07/18/2013  . Restless legs syndrome (RLS) 11/20/2012  . Generalized anxiety disorder   . Type 2 diabetes mellitus with vascular disease ([place])   . Hyperlipidemia LDL goal <70 01/16/2009  . TOBACCO ABUSE 07/27/2007  . Morbid obesity ([place]) 09/29/2006  . Essential hypertension 09/29/2006  . CALCULUS, KIDNEY 05/30/2002    [facility] ,MS, CCC-SLP  06/03/2020, 3:29 PM  [facility] [address] [place], [place], [address] Phone: [phone]   Fax:  [phone]   Name: [name] [name] MRN: [id number] Date of Birth: 1960/07/01

## 2020-06-08 ENCOUNTER — Ambulatory Visit: Payer: Managed Care, Other (non HMO) | Admitting: Occupational Therapy

## 2020-06-09 ENCOUNTER — Ambulatory Visit: Payer: Managed Care, Other (non HMO) | Admitting: Occupational Therapy

## 2020-06-09 ENCOUNTER — Ambulatory Visit: Payer: Managed Care, Other (non HMO)

## 2020-06-10 ENCOUNTER — Ambulatory Visit: Payer: Managed Care, Other (non HMO) | Admitting: Physical Therapy

## 2020-06-10 ENCOUNTER — Ambulatory Visit: Payer: Managed Care, Other (non HMO) | Admitting: Occupational Therapy

## 2020-06-10 ENCOUNTER — Ambulatory Visit: Payer: Managed Care, Other (non HMO)

## 2020-06-10 ENCOUNTER — Other Ambulatory Visit: Payer: Self-pay

## 2020-06-10 DIAGNOSIS — R278 Other lack of coordination: Secondary | ICD-10-CM

## 2020-06-10 DIAGNOSIS — R2689 Other abnormalities of gait and mobility: Secondary | ICD-10-CM

## 2020-06-10 DIAGNOSIS — M6281 Muscle weakness (generalized): Secondary | ICD-10-CM

## 2020-06-10 DIAGNOSIS — R41842 Visuospatial deficit: Secondary | ICD-10-CM

## 2020-06-10 DIAGNOSIS — I69318 Other symptoms and signs involving cognitive functions following cerebral infarction: Secondary | ICD-10-CM

## 2020-06-10 DIAGNOSIS — R2681 Unsteadiness on feet: Secondary | ICD-10-CM

## 2020-06-10 NOTE — Therapy (Signed)
[facility] [address] [place], [place], [address] Phone: [phone]   Fax:  [phone]  Physical Therapy Treatment  Patient Details  Name: [name] [name] MRN: [id number] Date of Birth: 01/16/1961 Referring Provider (PT): Dr. Edilia Bo   Encounter Date: 06/10/2020   PT End of Session - 06/10/20 1359    Visit Number 14    Number of Visits 17    Date for PT Re-Evaluation 06/16/20    Authorization Type [employer] Managed    PT Start Time 1230    PT Stop Time 1315    PT Time Calculation (min) 45 min    Activity Tolerance Patient tolerated treatment well    Behavior During Therapy [facility] for tasks assessed/performed           Past Medical History:  Diagnosis Date  . Angio-edema   . Anxiety   . Depression   . Diabetes mellitus type II    type 2  . GERD (gastroesophageal reflux disease)   . Headache    migraine  . History of kidney stones   . HLD (hyperlipidemia)   . HTN (hypertension)   . Morbid obesity ([place])   . Restless legs syndrome (RLS) 11/20/2012  . Stroke ([place]) 02/14/2020   ischemic/right sided affected  . Tobacco abuse   . Ulcer of left ankle (HCC)    last 2 months dime size dry dressing changing q day  . Urticaria     Past Surgical History:  Procedure Laterality Date  . Arm surgery Right 1969   Abscess excision and debridement at elbow  . COLONOSCOPY WITH PROPOFOL N/A 09/06/2016   Procedure: COLONOSCOPY WITH PROPOFOL;  Surgeon: Doran Stabler, MD;  [facility]: [facility];  Service: Gastroenterology;  Laterality: N/A;  . I & D EXTREMITY Left 05/29/2014   Procedure: IRRIGATION AND DEBRIDEMENT OF LEFT LEG WOUND AND PLACEMENT OF  INTEGRA  AND  VAC;  Surgeon: Theodoro Kos, DO;  Location: [place];  Service: Plastics;  Laterality: Left;  . implantable loop recorder implant  03/06/2020   Medtronic Reveal [place] model LNQ11 (SN  [id number]) implanted by Dr Rayann Heman for cryptogenic stroke   . INCISION AND DRAINAGE OF WOUND Left 04/23/2014   Procedure: IRRIGATION AND DEBRIDEMENT LOWER LEFT LEG WOUND WITH PLACEMENT OF INTEGRA AND VAC;  Surgeon: Theodoro Kos, DO;  Location: [facility];  Service: Plastics;  Laterality: Left;  . kidney stone removal  2004  . MINOR APPLICATION OF WOUND VAC Left 04/23/2014   Procedure: MINOR APPLICATION OF WOUND VAC;  Surgeon: Theodoro Kos, DO;  Location: [facility];  Service: Plastics;  Laterality: Left;  . SKIN SPLIT GRAFT Left 06/18/2014   Procedure: SKIN GRAFT SPLIT THICKNESS TO LOWER LEFT LEG WOUND WITH PLACEMENT OF VAC;  Surgeon: Theodoro Kos, DO;  Location: [facility];  Service: Plastics;  Laterality: Left;  Marland Kitchen VASECTOMY  1994    There were no vitals filed for this visit.   Subjective Assessment - 06/10/20 1236    Subjective Pt states he's been doing the exercises. He states he has been doing 5 lb weights for lifting. Pt states he still hasn't quite been walking 30 minutes.    Patient is accompained by: Family member    Pertinent History hypertension, hyperlipidemia, type 2 diabetes mellitus, restless leg syndrome, diabetic neuropathy, morbid obesity, tobacco abuse, depression/anxiety, GERD, chronic venous stasis ulcer in bilateral lower extremities    Limitations Reading;Walking;House hold activities  How long can you sit comfortably? no issues    How long can you stand comfortably? 10 min    How long can you walk comfortably? 5 min    Diagnostic tests CT head showed Hyperdense right MCA.  CTA head and neck and perfusion study showed 10 cc region of completed infarction in left occipital cortex and additional 11 cc at risk/penumbra. [name] (wife) reports that short term memory is affected.    Patient Stated Goals improve walking    Currently in Pain? No/denies    Pain Onset More than a month ago              [facility] PT Assessment - 06/10/20 0001      6 minute walk test results    Aerobic  Endurance Distance Walked 652    Endurance additional comments no a/d; 3 standing rest breaks             Ambulating ~75' no a/d CGA:  x1 rep with head turns  x1 rep with head nods  x1 rep fast/slow walking  x3 reps stepping over 4 orange hurdles   Ascend/descend 4 steps 6" height x 3 reps with no UE support alternating pattern; min A for 1 minor LOB on descent         [facility] Adult PT Treatment/Exercise - 06/10/20 0001      Ambulation/Gait   Ambulation/Gait Assistance 5: Supervision    Ambulation Distance (Feet) 1077 Feet    Assistive device None    Gait Pattern Step-through pattern    Ambulation Surface Level;Unlevel;Indoor;Outdoor;Paved    Curb 5: Supervision    Curb Details (indicate cue type and reason) completed 1 curb outdoors x 1 reps      Knee/Hip Exercises: Stretches   Active Hamstring Stretch Both;20 seconds    Other Knee/Hip Stretches Hip circles CW & CCW x 5      Knee/Hip Exercises: Machines for Strengthening   Total Gym Leg Press DL 100# 2x10                    PT Short Term Goals - 03/25/20 1131      PT SHORT TERM GOAL #1   Title patient will be able to walk with or without AD for 10 min to improve walking endurance    Baseline <5 min without AD, 12 minutes with SPC    Time 4    Period Weeks    Status Achieved    Target Date 03/24/20             PT Long Term Goals - 06/10/20 1409      PT LONG TERM GOAL #1   Title Pt will be able to ambulate for 30 min with or without AD to improve walking endurance (TARGET DATE 06/16/20)    Baseline <5 min without AD; able to amb 30 min with rest breaks (04/21/20)    Time 8    Period Weeks    Status Partially Met      PT LONG TERM GOAL #2   Title Pt will demo 12 sec or less with 5x/ sit to stand without HHA to improve functional strength (TARGET DATE 06/16/20)    Baseline 15 sec without HHA; 13.41 sec (04/21/20)    Time 8    Period Weeks    Status On-going      PT LONG TERM GOAL #3   Title  Pt will increase DGI score to at least 20/24 to demo decreased  fall risk (TARGET DATE 06/16/20)    Baseline 17/24 on 04/21/20    Time 8    Period Weeks    Status On-going      PT LONG TERM GOAL #4   Title Pt and family would like pt to be able to squat at least 25# to demo improved bilat LE and trunk strength (TARGET DATE 06/16/20)    Time 8    Period Weeks    Status On-going      PT LONG TERM GOAL #5   Title Pt would like to be able to ambulate 6 min at least 581 ft without a/d to demo improved endurance and community mobility (TARGET DATE 06/16/20)    Baseline 481' on 6 MWT with use of SPC (04/21/20); 652' no a/d (06/10/20)    Time 8    Period Weeks    Status Achieved      PT LONG TERM GOAL #6   Title Pt will have improved FOTO score to at least 66% (TARGET DATE 06/16/20)    Baseline 45% at eval    Time 4    Period Weeks    Status On-going                 Plan - 06/10/20 1356    Clinical Impression Statement Treatment focused on progressing pt's strength, gait, and endurance. Pt with improving 6 MWT -- able to walk 652' today without a/d. Pt utilizing increased weight on leg press. Pt with improving stability performing dynamic gait items. Progressed pt to blue tband for home use.    Personal Factors and Comorbidities Comorbidity 3+;Time since onset of injury/illness/exacerbation    Comorbidities hypertension, hyperlipidemia, type 2 diabetes mellitus, restless leg syndrome, diabetic neuropathy, morbid obesity, tobacco abuse, depression/anxiety, GERD, chronic venous stasis ulcer in bilateral lower extremities    Examination-Activity Limitations Squat;Stand;Stairs    Examination-Participation Restrictions Community Activity;Yard Work;Shop    Rehab Potential Good    PT Frequency 1x / week    PT Duration 8 weeks    PT Treatment/Interventions ADLs/Self Care Home Management;Gait training;Stair training;Functional mobility training;Therapeutic activities;Therapeutic exercise;Balance  training;Neuromuscular re-education;Patient/family education;Manual techniques;Passive range of motion;Energy conservation;Joint Manipulations    PT Next Visit Plan Check goals! How are strengthening exercises?  Continue exercises for BLE strengthening (currently progressing his deadlifts and squats; consider stepping on to higher steps). 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    PT Home Exercise Plan Access Code: [email or web address] + Walking program: walking with family member for 5 min and gradually progressing it to 30 min    Consulted and Agree with Plan of Care Patient           Patient will benefit from skilled therapeutic intervention in order to improve the following deficits and impairments:  Abnormal gait,Decreased activity tolerance,Decreased mobility,Decreased endurance,Difficulty walking,Impaired sensation,Impaired vision/preception,Postural dysfunction  Visit Diagnosis: Muscle weakness (generalized)  Unsteadiness on feet  Other abnormalities of gait and mobility  Other lack of coordination     Problem List Patient Active Problem List   Diagnosis Date Noted  . Aortic calcification ([place]) 02/22/2020  . Carotid artery calcification, bilateral 02/22/2020  . MDD (major depressive disorder), recurrent episode ([place])   . GERD (gastroesophageal reflux disease)   . Ischemic stroke ([place]), occipital   . Diabetic polyneuropathy associated with type 2 diabetes mellitus ([place]) 06/30/2018  . Erectile dysfunction due to type 2 diabetes mellitus ([place]) 06/30/2018  . Circulation disorder of lower extremity 05/31/2015  . IBS (irritable bowel syndrome) 12/24/2014  . 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 Diabetic leg ulcer ([place]) 07/18/2013  . Restless legs syndrome (RLS) 11/20/2012  . Generalized anxiety disorder   . Type 2 diabetes mellitus with vascular disease ([place])   . Hyperlipidemia LDL goal <70 01/16/2009  . TOBACCO ABUSE 07/27/2007  . Morbid obesity ([place]) 09/29/2006  .  Essential hypertension 09/29/2006  . CALCULUS, KIDNEY 05/30/2002    [facility] [address] Ashley PT, DPT 06/10/2020, 2:11 PM  [facility] [address] [place], [place], [address] Phone: [phone]   Fax:  [phone]  Name: [name] [name] MRN: [id number] Date of Birth: [birth year]

## 2020-06-10 NOTE — Therapy (Signed)
[facility] [address] [place], [place], [address] Phone: [phone]   Fax:  [phone]  Occupational Therapy Treatment  Patient Details  Name: [name] [name] MRN: [id number] Date of Birth: 05/08/1961 Referring Provider (OT): Dr. Erlinda Hong   Encounter Date: 06/10/2020   OT End of Session - 06/10/20 1556    Visit Number 18    Number of Visits 24    Date for OT Re-Evaluation 06/17/20    Authorization Type Cigna/C[employer]a managed    Authorization Time Period 60 days    OT Start Time 1315    OT Stop Time 1400    OT Time Calculation (min) 45 min    Activity Tolerance Patient tolerated treatment well;Other (comment)    Behavior During Therapy [facility] for tasks assessed/performed           Past Medical History:  Diagnosis Date  . Angio-edema   . Anxiety   . Depression   . Diabetes mellitus type II    type 2  . GERD (gastroesophageal reflux disease)   . Headache    migraine  . History of kidney stones   . HLD (hyperlipidemia)   . HTN (hypertension)   . Morbid obesity ([place])   . Restless legs syndrome (RLS) 11/20/2012  . Stroke ([place]) 02/14/2020   ischemic/right sided affected  . Tobacco abuse   . Ulcer of left ankle (HCC)    last 2 months dime size dry dressing changing q day  . Urticaria     Past Surgical History:  Procedure Laterality Date  . Arm surgery Right 1969   Abscess excision and debridement at elbow  . COLONOSCOPY WITH PROPOFOL N/A 09/06/2016   Procedure: COLONOSCOPY WITH PROPOFOL;  Surgeon: Doran Stabler, MD;  [facility];  Service: Gastroenterology;  Laterality: N/A;  . I & D EXTREMITY Left 05/29/2014   Procedure: IRRIGATION AND DEBRIDEMENT OF LEFT LEG WOUND AND PLACEMENT OF  INTEGRA  AND  VAC;  Surgeon: Theodoro Kos, DO;  Location: [facility];  Service: Plastics;  Laterality: Left;  . implantable loop recorder implant  03/06/2020   Medtronic Reveal [place] model LNQ11 (SN   [id number]) implanted by Dr Rayann Heman for cryptogenic stroke  . INCISION AND DRAINAGE OF WOUND Left 04/23/2014   Procedure: IRRIGATION AND DEBRIDEMENT LOWER LEFT LEG WOUND WITH PLACEMENT OF INTEGRA AND VAC;  Surgeon: Theodoro Kos, DO;  Location: [facility];  Service: Plastics;  Laterality: Left;  . kidney stone removal  2004  . MINOR APPLICATION OF WOUND VAC Left 04/23/2014   Procedure: MINOR APPLICATION OF WOUND VAC;  Surgeon: Theodoro Kos, DO;  Location: [facility];  Service: Plastics;  Laterality: Left;  . SKIN SPLIT GRAFT Left 06/18/2014   Procedure: SKIN GRAFT SPLIT THICKNESS TO LOWER LEFT LEG WOUND WITH PLACEMENT OF VAC;  Surgeon: Theodoro Kos, DO;  Location: [place];  Service: Plastics;  Laterality: Left;  Marland Kitchen VASECTOMY  1994    There were no vitals filed for this visit.   Subjective Assessment - 06/10/20 1321    Subjective  Pt denies any pain. Pt does not report any changes.    Pertinent History CVA 02/14/20 Lt occipital lobe. PMH: 2 small previous strokes, DM, GERD, HLD, HTN (but now has issues w/ orthostasis)    Limitations no driving, loop recorder    Currently in Pain? No/denies            Pt performed financial management task from  bills and filling out register - however errors made due to transposing numbers incorrectly (most likely d/t aphasia), and possibly from glare on calculator d/t visual deficits. Pt also made 1 error due to subtracting same number twice accidentally when using calculator.   Pt got 9/11 simple math word problems correct (82% accuracy).   Environmental scanning 10/10 correct - 100% accuracy                      OT Short Term Goals - 05/14/20 1431      OT SHORT TERM GOAL #1   Title Pt will be independent with HEP for Rt hand coordination and Rt shoulder (low range strengthening)    Time 4    Period Weeks    Status Achieved   MET for coordination     OT SHORT TERM GOAL #2   Title  Pt will be independent with visual scanning strategies to compensate for Rt visual field cut    Time 4    Period Weeks    Status Achieved      OT SHORT TERM GOAL #3   Title Improve coordination Rt hand to 28 sec or less for high level tasks    Baseline 33.91 sec    Time 4    Period Weeks    Status Achieved   25.56 sec     OT SHORT TERM GOAL #4   Title Pt will verbalize understanding with memory compensatory strategies and utilize for medication and financial management    Time 4    Period Weeks    Status Achieved      OT SHORT TERM GOAL #5   Title Pt will perform simple financial management tasks using calculator prn with supervision and no more than min v.c's    Time 4    Period Weeks    Status Achieved   min cues to copy numbers correctly from calculator to paper     OT SHORT TERM GOAL #6   Title Pt will perform simple stovetop task with supervision only    Time 4    Period Weeks    Status Partially Met   assist for cutting or to take something off stove or out of oven for safety d/t vision, supervision and cues required otherwise     OT SHORT TERM GOAL #7   Title Pt will perform tabletop scanning with 100% accuracy and environmental scanning with 75% or greater accuracy    Time 4    Period Weeks    Status Partially Met   met w/ environmental scanning (92%), but inconsistent with tabletop scanning - approx 80%            OT Long Term Goals - 06/10/20 1556      OT LONG TERM GOAL #1   Title Pt to return to cooking with direct supervision    Time 8    Period Weeks    Status Partially Met   simple cooking with occasional cues for safety     OT LONG TERM GOAL #2   Title Pt will complete meal plan for the week and develop corresponding grocery list I'ly    Time 8    Period Weeks    Status Partially Met   min cues for attention to detail     OT LONG TERM GOAL #3   Title Pt will perform full financial management tasks at 100% accuracy with no more than min cues  Time 8    Period Weeks    Status Not Met   transposing #'s and requires direct sup/min to mod cues     OT LONG TERM GOAL #4   Title Pt will demo organizational and problem solving skills to complete and adapt a schedule prn with no more than min cues    Time 8    Period Weeks    Status Revised      OT LONG TERM GOAL #5   Title Pt will perform environmental scanning with 90% or greater accuracy consistently    Time 8    Period Weeks    Status Achieved   Between 92-100% accuracy                Plan - 06/10/20 1558    Clinical Impression Statement Pt is progressing towards goals. He requires increased time for processing tasks. Pt continues to miss pell words and transpose numbers which is likely related to aphasia.    Occupational performance deficits (Please refer to evaluation for details): ADL's;IADL's;Work;Leisure    Body Structure / Function / Physical Skills ADL;Strength;Balance;UE functional use;IADL;Vision;Coordination;Mobility;Sensation;FMC    Cognitive Skills Memory;Perception;Problem Solve;Sequencing;Thought    Rehab Potential Good    Comorbidities Affecting Occupational Performance: May have comorbidities impacting occupational performance    OT Frequency 2x / week    OT Duration --   for 5 additional weeks   OT Treatment/Interventions Self-care/ADL training;DME and/or AE instruction;Therapeutic activities;Therapeutic exercise;Cognitive remediation/compensation;Coping strategies training;Functional Mobility Training;Neuromuscular education;Visual/perceptual remediation/compensation;Patient/family education    Plan work on LTG #4, anticipate d/c end of next week    Consulted and Agree with Plan of Care Patient           Patient will benefit from skilled therapeutic intervention in order to improve the following deficits and impairments:   Body Structure / Function / Physical Skills: ADL,Strength,Balance,UE functional  use,IADL,Vision,Coordination,Mobility,Sensation,FMC Cognitive Skills: Memory,Perception,Problem Solve,Sequencing,Thought     Visit Diagnosis: Other symptoms and signs involving cognitive functions following cerebral infarction  Visuospatial deficit    Problem List Patient Active Problem List   Diagnosis Date Noted  . Aortic calcification ([place]) 02/22/2020  . Carotid artery calcification, bilateral 02/22/2020  . MDD (major depressive disorder), recurrent episode ([place])   . GERD (gastroesophageal reflux disease)   . Ischemic stroke ([place]), occipital   . Diabetic polyneuropathy associated with type 2 diabetes mellitus ([place]) 06/30/2018  . Erectile dysfunction due to type 2 diabetes mellitus ([place]) 06/30/2018  . Circulation disorder of lower extremity 05/31/2015  . IBS (irritable bowel syndrome) 12/24/2014  . 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 Diabetic leg ulcer ([place]) 07/18/2013  . Restless legs syndrome (RLS) 11/20/2012  . Generalized anxiety disorder   . Type 2 diabetes mellitus with vascular disease ([place])   . Hyperlipidemia LDL goal <70 01/16/2009  . TOBACCO ABUSE 07/27/2007  . Morbid obesity ([place]) 09/29/2006  . Essential hypertension 09/29/2006  . CALCULUS, KIDNEY 05/30/2002    Carey Bullocks, OTR/L 06/10/2020, 4:05 PM  [employer] [address] [place], [place], [address] Phone: [phone]   Fax:  [phone]  Name: Furman Trentman MRN: [id number] Date of Birth: Nov 11, 1960

## 2020-06-11 ENCOUNTER — Encounter: Payer: Managed Care, Other (non HMO) | Attending: Psychology | Admitting: Psychology

## 2020-06-11 ENCOUNTER — Other Ambulatory Visit: Payer: Self-pay

## 2020-06-11 DIAGNOSIS — R41841 Cognitive communication deficit: Secondary | ICD-10-CM | POA: Diagnosis present

## 2020-06-11 DIAGNOSIS — I6930 Unspecified sequelae of cerebral infarction: Secondary | ICD-10-CM | POA: Insufficient documentation

## 2020-06-11 DIAGNOSIS — I69398 Other sequelae of cerebral infarction: Secondary | ICD-10-CM | POA: Insufficient documentation

## 2020-06-11 DIAGNOSIS — I639 Cerebral infarction, unspecified: Secondary | ICD-10-CM | POA: Diagnosis present

## 2020-06-11 DIAGNOSIS — I69320 Aphasia following cerebral infarction: Secondary | ICD-10-CM | POA: Diagnosis present

## 2020-06-11 DIAGNOSIS — R279 Unspecified lack of coordination: Secondary | ICD-10-CM | POA: Insufficient documentation

## 2020-06-11 NOTE — Progress Notes (Signed)
NEUROBEHAVIORAL STATUS EXAM   Name: [name] [name] Date of Birth: [birth year] Date of Interview: 06/11/2020  Reason for Referral:  [name] [name] is a 60 y.o. male who is referred for neuropsychological evaluation by Dr. Patsy Lageropland of [facility] due to concerns about mild short-term memory and cognitive difficulties post stroke (02/14/20). This patient is accompanied in the office by his wife who supplements the history.    History of Presenting Problem:  [name] [name] is a 60 year old Caucasian male seen today for initial neuropsychological consultation post stroke. History is obtained from clinical interview of patient and wife and review of electronic medical records including neuroimaging results.   Patient has a medical history remarkable forhypertension, hyperlipidemia, type 2 diabetes mellitus, restless leg syndrome, diabetic neuropathy, morbid obesity, tobacco abuse, depression/anxiety, GERD, chronic venous stasis ulcer in bilateral lower extremities presentedto emergency department with headache and right thigh vision loss started 2 AM on the morning of admission on 02/14/2020. He initially went to his ophthalmologist and was referred to the emergency department. Concern was for acute stroke. Patient was hospitalized for further management.  CT perfusion scan suggested a left occipital lobe infarct.  MRI was attempted but patient refused due to significant claustrophobia.  Subsequent repeat CT scan confirmed left posterior division MCA infarct.  CT angiogram showed no significant large vessel stenosis or occlusion.  Lower extremity venous Dopplers were negative for DVT.  Transthoracic echo showed normal ejection fraction without cardiac source of embolism.  LDL cholesterol was quite low at 15 mg percent and hemoglobin A1c was elevated 8.4.  Patient was started on aspirin and Plavix for 3 weeks followed by aspirin alone.  Patient states is done well since discharge she  is now beginning to see some movement in the right peripheral field of vision but is still not able to count fingers.  He still has trouble reading and comprehending.  His speech has improved though it is still slightly hesitant. Is tolerating aspirin well without bruising or bleeding.  Patient is unable to return to work in [employer] and is currently on short-term disability.  He has not been driving.  He denies any prior history of atrial fibrillation, syncope or palpitations.  He had outpatient loop recorder placed; paroxysmal A. fib not yet found.  He also complains of mild short-term memory and cognitive difficulties which are new since his stroke.    He was seen by Dr. Patsy Lageropland (PCP) on 02/20/20 for follow up after leaving hospital for left occipital lobe stroke with vision impairment. Dr. Patsy Lageropland reported the following: "The patient had a headache, then he went back to bed and he awoke and his headache was quite bad.  And he subsequently had vision impairment on the right and he went to his eye doctor.  His eye doctor felt as if this was not a vision issue predominantly and he needed to be evaluated for stroke. By the time he went and arrived in the ER he was outside the window for TPA. He was admitted by the hospitalist and had inpatient neurological consultation.  He did stop his gabapentin for a while had significant recurrence of peripheral neuropathy, and this was better when he took his wife's Lyrica.  He would like to change to Lyrica. He also has become more tearful, and he has a history of depression, and he does think that this is worse".   He is currently participating in PT, OT, and SLT   The following interventions are being  utilized in SLT: Surveyor, quantity;Environmental controls;Cueing hierarchy;SLP instruction and feedback;Cognitive reorganization;Compensatory techniques;Functional tasks;Compensatory strategies;Internal/external aids;Multimodal communcation approach; Patient/family  education.   According to SLP progress note (06/18/20), "patient continues to present with mild verbal expressive aphasia, moderate reading comprehension  mild written expression impairments. Reading comprehension consistently requires significant extended time at phrase/short sentence level. Mild cognitive impairments of attention, memory and processing persist. [name] [name] and his family carryover strategies to compensate for cognitive impairments, however [name] [name] is relying on [name] to solve more complex functional tasks and communication interaction. .[name] [name]'s verbal expression is functional in a simple conversation, and moderately complex conversatoin continues to require extended time, verbal cues and quesitoning cues  with significant halting for thought organizatoinand word finding difficulties. In this SLP's opinion, this is less-noticable when [name] [name] feels more comfortable. 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 He agrees with discharge today with the knowledge he can return if he desires for another ST evaluation to compare his abilities then with where he is now, and determination of re-initiation of ST would be made at that time. He is pursuing ST at [facility] for the summer months, and has been involved in their aphasia group and enjoys this very much"  Upon direct questioning, the patient's wife reported his primary difficulties as:  1) Expressive aphasia-including speech difficulties, reading, writing, and processing 2) Memory impairment 3) Vision impairment-bilateral hemianopsia   Forgetting recent conversations/events:  Yes, worse since CVA Repeating statements/questions: Yes, reportedly worse since CVA Misplacing/losing items: Yes   Forgetting appointments or other obligations:  Forgetting to take medications: Not currently managing medications  *Uses memory  board at home and rote strategies to "drill" information in. Patient self rated memory as 20/100; "unless drilled in my head".   Difficulty concentrating: Yes  Starting but not finishing tasks: No Distracted easily: Yes Processing information more slowly: Yes. Moderately impaired per wife's report.   Word-finding difficulty: Yes, requires extended time to complete sentences Word substitutions: No Writing difficulty: Yes Spelling difficulty: yes Comprehension difficulty: Yes  Reading: 4th grade ability per wife's report of his results during testing on rehab unit.  Current Functioning: Work: Currently receiving disability due to stroke. 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   Medical/Physical complaints:  Any hx of stroke/TIA, MI, LOC/TBI, Sz? Ischemic stroke involving left occipital lobe Hx falls?  Denied  Balance, probs walking?  Sleep: Insomnia? OSA? CPAP? REM sleep beh sx? No, reportedly sleeps around 6-8 hors per night; wakes up 1-2 x to urinate.  Visual illusions/hallucinations? Appetite/Nutrition/Weight changes: Fair appetite; has voluntarily lost significant amount of weight. Actively trying to lose more to improve health and reduce risk for future stroke.   Behavioral disturbance/Personality change: Apathy and some affective lability. No reports of violence, aggression, or inappropriate (e.g., sexual) behaviors. Wife described him as more anxious and stressed about finances and "lack of progress" in rehab since CVA.  Panic attacks on occasion; associated triggers include difficulty breathing, chocking sensation, or shortness of breath.    Patient reportedly blames himself for h aving stroke and poor health, which leads to  feeling guilt and shame; this worsens already depressed  mood.     Suicidal Ideation/Intention: Denied   Psychiatric History: History of depression, anxiety, other MH disorder: Depression and GAD History of MH treatment: Talk therapy and pharmacotherapy (I.e., Paxil 40mg )  History of SI: Denied  History of substance dependence/treatment: Denied   Social History: Born/Raised: to be included  Education:13  Occupational history: [employer] [place] [place] [employer] Environmental health practitioner. [employer])  Marital history: Married 35 years  Children: 3 daughters (ages 73, 4, 68) Alcohol: None Tobacco: Former smoker. Attempting to quit using "vaping" as replacement.  SA: None reported  Medical History: Past Medical History:  Diagnosis Date  . Angio-edema   . Anxiety   . Depression   . Diabetes mellitus type II    type 2  . GERD (gastroesophageal reflux disease)   . Headache    migraine  . History of kidney stones   . HLD (hyperlipidemia)   . HTN (hypertension)   . Morbid obesity (HCC)   . Restless legs syndrome (RLS) 11/20/2012  . Stroke (HCC) 02/14/2020   ischemic/right sided affected  . Tobacco abuse   . Ulcer of left ankle (HCC)    last 2 months dime size dry dressing changing q day  . Urticaria     CT-scan of the brain (02/15/2020 1. Question hyperdense right MCA. This is not definite. 2. No CT sign of acute parenchymal infarction elsewhere in the cerebral hemispheres. Old small vessel infarction in the right cerebellum. Old appearing infarction in the right occipital lobe. 3. ASPECTS is 10. 4. Left maxillary and ethmoid sinus opacification. Possible odontogenic related to dental and periodontal disease.  CTA H/N and CTP head (02/14/2020)  1. 10 CC region of completed infarction in the left occipital cortex. Additional 11 cc of at risk adjacent penumbra. 2. No large or medium vessel occlusion visible. Patient does have atherosclerotic irregularity  of the more distal PCA branches. 3. Atherosclerotic disease at the left carotid bifurcation but without stenosis. 4. Aortic atherosclerosis.  Current Medications:  Outpatient Encounter Medications as of 06/11/2020  Medication Sig  . acetaminophen (TYLENOL) 500 MG tablet Take 1,000 mg by mouth 2 (two) times daily as needed (for pain).  06/13/2020 aspirin EC 81 MG EC tablet Take 1 tablet (81 mg total) by mouth daily. Swallow whole.  . bromocriptine (PARLODEL) 2.5 MG tablet Take 0.5 tablets (1.25 mg total) by mouth at bedtime.  . colesevelam (WELCHOL) 625 MG tablet Take 2 tablets (1,250 mg total) by mouth daily.  . dapagliflozin propanediol (FARXIGA) 10 MG TABS tablet TAKE 1 TABLET BY MOUTH DAILY BEFORE BREAKFAST  . EPINEPHrine 0.3 mg/0.3 mL IJ SOAJ injection INJECT 0.3 ML INTO THE  MUSCLE ONCE FOR 1 DOSE.  Marland Kitchen glipiZIDE (GLUCOTROL XL) 10 MG 24 hr tablet Take 1 tablet (10 mg total) by mouth every morning. TAKE 2 TABLETS BY MOUTH  DAILY WITH BREAKFAST (Patient taking differently: Take 10 mg by mouth every morning. )  . glucose blood (CONTOUR NEXT TEST) test strip Use to check blood sugar two times a day.  . Lancets Micro Thin 33G MISC Use to check blood sugar two times daily.  Marland Kitchen lisinopril-hydrochlorothiazide (ZESTORETIC) 20-12.5 MG tablet Take 0.5 tablets by mouth daily. (Patient taking differently: Take 1 tablet by mouth daily. )  . metFORMIN (GLUCOPHAGE-XR) 500 MG 24 hr tablet TAKE 4 TABLETS BY MOUTH  DAILY WITH BREAKFAST  . Multiple Vitamin (MULTIVITAMIN) tablet Take 1 tablet by mouth daily.    . pantoprazole (PROTONIX) 40 MG tablet  TAKE 1 TABLET BY MOUTH  DAILY  . PARoxetine (PAXIL) 40 MG tablet Take 1 tablet (40 mg total) by mouth every morning.  . pentoxifylline (TRENTAL) 400 MG CR tablet TAKE 1 TABLET BY MOUTH 3  TIMES DAILY WITH MEALS  . pregabalin (LYRICA) 75 MG capsule Take 1 capsule (75 mg total) by mouth 3 (three) times daily.  Marland Kitchen rOPINIRole (REQUIP) 0.5 MG tablet TAKE 1 TABLET BY MOUTH  TWICE  DAILY  . rosuvastatin (CRESTOR) 20 MG tablet Take 1 tablet (20 mg total) by mouth at bedtime.  . sildenafil (VIAGRA) 100 MG tablet Take 1 tablet (100 mg total) by mouth daily as needed for erectile dysfunction.  . TRULICITY 4.5 MG/0.5ML SOPN INJECT 4.5 MG INTO THE SKIN ONCE A WEEK   No facility-administered encounter medications on file as of 06/11/2020.   Behavioral Observations:    Appearance: Casually and appropriately dressed and groomed Gait: Ambulated independently, no gross abnormalities observed Speech: Non-fluent; reduced rate. Prominent word finding difficulty. Thought process: Linear and goal directed  Affect: Flattened and tearful at times, particularly when discussing impact of stroke on family.  Interpersonal: Pleasant, polite and appropriate  60  minutes spent face-to-face with patient completing neurobehavioral status exam. 60 minutes spent integrating medical records/clinical data and completing this report. [id number] unit; [id number].  TESTING: There is medical necessity to proceed with neuropsychological assessment as the results will be used to aid in differential diagnosis and clinical decision-making and to inform specific treatment recommendations. Per the patient, wife, and medical records reviewed, there has been a change in cognitive functioning and a reasonable suspicion of neurocognitive disorder.  Clinical Decision Making: In considering the patient's current level of functioning, level of presumed impairment, nature of symptoms, emotional and behavioral responses during the interview, level of literacy, and observed level of motivation, a battery of tests was selected for patient to complete during a separate testing appointment.    PLAN: The patient will return to complete the above referenced full battery of neuropsychological testing with this provider during scheduled testing appointment. Education regarding testing procedures was provided to the patient.  Subsequently, the patient will see this provider for a follow-up session at which time his test performances and my impressions and treatment recommendations will be reviewed in detail.    Evaluation ongoing; full report to follow.

## 2020-06-15 ENCOUNTER — Ambulatory Visit: Payer: Managed Care, Other (non HMO) | Admitting: Occupational Therapy

## 2020-06-15 ENCOUNTER — Ambulatory Visit (INDEPENDENT_AMBULATORY_CARE_PROVIDER_SITE_OTHER): Payer: Managed Care, Other (non HMO)

## 2020-06-15 ENCOUNTER — Ambulatory Visit: Payer: Managed Care, Other (non HMO)

## 2020-06-15 DIAGNOSIS — I639 Cerebral infarction, unspecified: Secondary | ICD-10-CM | POA: Diagnosis not present

## 2020-06-16 ENCOUNTER — Other Ambulatory Visit: Payer: Self-pay | Admitting: Family Medicine

## 2020-06-16 ENCOUNTER — Other Ambulatory Visit: Payer: Managed Care, Other (non HMO)

## 2020-06-16 DIAGNOSIS — E039 Hypothyroidism, unspecified: Secondary | ICD-10-CM

## 2020-06-16 DIAGNOSIS — Z79899 Other long term (current) drug therapy: Secondary | ICD-10-CM

## 2020-06-16 DIAGNOSIS — E785 Hyperlipidemia, unspecified: Secondary | ICD-10-CM

## 2020-06-16 DIAGNOSIS — E1159 Type 2 diabetes mellitus with other circulatory complications: Secondary | ICD-10-CM

## 2020-06-16 DIAGNOSIS — L97929 Non-pressure chronic ulcer of unspecified part of left lower leg with unspecified severity: Secondary | ICD-10-CM

## 2020-06-16 DIAGNOSIS — E559 Vitamin D deficiency, unspecified: Secondary | ICD-10-CM

## 2020-06-16 DIAGNOSIS — E11622 Type 2 diabetes mellitus with other skin ulcer: Secondary | ICD-10-CM

## 2020-06-16 DIAGNOSIS — Z125 Encounter for screening for malignant neoplasm of prostate: Secondary | ICD-10-CM

## 2020-06-17 ENCOUNTER — Ambulatory Visit: Payer: Managed Care, Other (non HMO)

## 2020-06-17 ENCOUNTER — Other Ambulatory Visit (INDEPENDENT_AMBULATORY_CARE_PROVIDER_SITE_OTHER): Payer: Managed Care, Other (non HMO)

## 2020-06-17 ENCOUNTER — Other Ambulatory Visit: Payer: Managed Care, Other (non HMO)

## 2020-06-17 ENCOUNTER — Ambulatory Visit: Payer: Managed Care, Other (non HMO) | Admitting: Occupational Therapy

## 2020-06-17 ENCOUNTER — Other Ambulatory Visit: Payer: Self-pay

## 2020-06-17 DIAGNOSIS — I69318 Other symptoms and signs involving cognitive functions following cerebral infarction: Secondary | ICD-10-CM

## 2020-06-17 DIAGNOSIS — R41842 Visuospatial deficit: Secondary | ICD-10-CM

## 2020-06-17 DIAGNOSIS — R41841 Cognitive communication deficit: Secondary | ICD-10-CM

## 2020-06-17 DIAGNOSIS — M6281 Muscle weakness (generalized): Secondary | ICD-10-CM

## 2020-06-17 DIAGNOSIS — E1159 Type 2 diabetes mellitus with other circulatory complications: Secondary | ICD-10-CM

## 2020-06-17 DIAGNOSIS — R2689 Other abnormalities of gait and mobility: Secondary | ICD-10-CM

## 2020-06-17 DIAGNOSIS — Z125 Encounter for screening for malignant neoplasm of prostate: Secondary | ICD-10-CM

## 2020-06-17 DIAGNOSIS — Z79899 Other long term (current) drug therapy: Secondary | ICD-10-CM

## 2020-06-17 DIAGNOSIS — R2681 Unsteadiness on feet: Secondary | ICD-10-CM

## 2020-06-17 DIAGNOSIS — R4701 Aphasia: Secondary | ICD-10-CM

## 2020-06-17 DIAGNOSIS — R278 Other lack of coordination: Secondary | ICD-10-CM

## 2020-06-17 DIAGNOSIS — H53461 Homonymous bilateral field defects, right side: Secondary | ICD-10-CM

## 2020-06-17 DIAGNOSIS — E785 Hyperlipidemia, unspecified: Secondary | ICD-10-CM

## 2020-06-17 LAB — CUP PACEART REMOTE DEVICE CHECK
Date Time Interrogation Session: 20220115103111
Implantable Pulse Generator Implant Date: 20211008

## 2020-06-17 NOTE — Therapy (Signed)
[facility] [address] [place], [place], [address] Phone: [phone]   Fax:  [phone]  Speech Language Pathology Treatment/Discharge Summary  Patient Details  Name: [name] [name] MRN: [id number] Date of Birth: August 14, 1960 Referring Provider (SLP): Bonnielee Haff (referring); Copland, Frederico Hamman (PCP/documentation)   Encounter Date: 06/17/2020   End of Session - 06/17/20 1451    Visit Number 19    Number of Visits 32    Date for SLP Re-Evaluation 07/10/20    SLP Start Time 4259    SLP Stop Time  1445    SLP Time Calculation (min) 42 min    Activity Tolerance Patient tolerated treatment well           Past Medical History:  Diagnosis Date  . Angio-edema   . Anxiety   . Depression   . Diabetes mellitus type II    type 2  . GERD (gastroesophageal reflux disease)   . Headache    migraine  . History of kidney stones   . HLD (hyperlipidemia)   . HTN (hypertension)   . Morbid obesity ([place])   . Restless legs syndrome (RLS) 11/20/2012  . Stroke ([place]) 02/14/2020   ischemic/right sided affected  . Tobacco abuse   . Ulcer of left ankle (HCC)    last 2 months dime size dry dressing changing q day  . Urticaria     Past Surgical History:  Procedure Laterality Date  . Arm surgery Right 1969   Abscess excision and debridement at elbow  . COLONOSCOPY WITH PROPOFOL N/A 09/06/2016   Procedure: COLONOSCOPY WITH PROPOFOL;  Surgeon: Doran Stabler, MD;  Location: [facility];  Service: Gastroenterology;  Laterality: N/A;  . I & D EXTREMITY Left 05/29/2014   Procedure: IRRIGATION AND DEBRIDEMENT OF LEFT LEG WOUND AND PLACEMENT OF  INTEGRA  AND  VAC;  Surgeon: Theodoro Kos, DO;  Location: [facility];  Service: Plastics;  Laterality: Left;  . implantable loop recorder implant  03/06/2020   Medtronic Reveal [place] model LNQ11 (SN  [id number]) implanted by Dr Rayann Heman for cryptogenic stroke  . INCISION AND  DRAINAGE OF WOUND Left 04/23/2014   Procedure: IRRIGATION AND DEBRIDEMENT LOWER LEFT LEG WOUND WITH PLACEMENT OF INTEGRA AND VAC;  Surgeon: Theodoro Kos, DO;  Location: [facility];  Service: Plastics;  Laterality: Left;  . kidney stone removal  2004  . MINOR APPLICATION OF WOUND VAC Left 04/23/2014   Procedure: MINOR APPLICATION OF WOUND VAC;  Surgeon: Theodoro Kos, DO;  Location: [facility];  Service: Plastics;  Laterality: Left;  . SKIN SPLIT GRAFT Left 06/18/2014   Procedure: SKIN GRAFT SPLIT THICKNESS TO LOWER LEFT LEG WOUND WITH PLACEMENT OF VAC;  Surgeon: Theodoro Kos, DO;  Location: [facility];  Service: Plastics;  Laterality: Left;  Marland Kitchen VASECTOMY  1994    There were no vitals filed for this visit.  SPEECH THERAPY DISCHARGE SUMMARY  Visits from Start of Care: 19  Current functional level related to goals / functional outcomes: See "Plan" below, and "long term goals" below   Remaining deficits: See "plan" below   Education / Equipment: Compensations for language and communication-cognition deficits.   Plan: Patient agrees to discharge.  Patient goals were partially met. Patient is being discharged due to                                                     ?????  taking a break from formal ST at this facility.       Subjective Assessment - 06/17/20 1409    Subjective Pt's last scheduled appointent today.    Currently in Pain? No/denies                 ADULT SLP TREATMENT - 06/17/20 1419      General Information   Behavior/Cognition Alert;Cooperative;Pleasant mood      Treatment Provided   Treatment provided Cognitive-Linquistic      Cognitive-Linquistic Treatment   Treatment focused on Cognition;Aphasia    Skilled Treatment Pt arives with flat affect today - asking about what to do to refer back for ST when/if necessary. Mod complex conversation today about pt's remaining deficits and his involvement with [facility]. Pt  with extended time usually due to pausing/hesitation, and rare min A for anomia (phonemic cues mostly). Reviewed [facility] therapy site and encouraged pt to do this for reading practice and writing - suggested pt take "hybrid" approach where he reads 1-2 sentences and then listens for 5-10 minutes and then alternates. SLP told pt that  talking was the best practice for his word finding and reviewed compensations of manipulating environment for maximizing langauge expression.      Assessment / Recommendations / Plan   Plan Discharge SLP treatment due to (comment)      Progression Toward Goals   Progression toward goals --   d/c day- see  summary           SLP Education - 06/17/20 1451    Education Details see "pt instructions"    Person(s) Educated Patient    Methods Explanation;Handout    Comprehension Verbalized understanding            SLP Short Term Goals - 05/27/20 1524      SLP SHORT TERM GOAL #1   Title Patient will participate functionally in 8 minutes simple-mod complex conversation using compensations for anomia as necessary, x3 visits.    Baseline 03/31/20 04/02/20    Time 1    Period Weeks    Status Partially Met      SLP SHORT TERM GOAL #2   Title Pt will demonstrate auditory comprehension of 5 minutes simple-mod complex material by answering [place] questions >90% accuracy x 2 sessions.    Baseline 04/02/20    Time 1    Period Weeks    Status Partially Met      SLP SHORT TERM GOAL #3   Title Pt will participate in assessment of reading, writing and cognition with goals added PRN    Baseline writing assessed 03/12/20; allowing more time for assessment due to anxiety    Time 1    Period Weeks    Status Partially Met            SLP Long Term Goals - 06/03/20 1528      SLP LONG TERM GOAL #1   Title Patient will participate functionally in 15 minutes mod complex-complex conversation using compensations as necessary x 3 visits.    Time 1    Period Weeks    Status  Partially Met      SLP LONG TERM GOAL #2   Title Patient will demonstrate auditory comprehension of 15 minutes mod complex content (conversation, podcast, radio program, etc) x 3 visits by answering detailed questions >90% accuracy.    Baseline 05/08/20, 06-17-20    Time 6    Period Weeks    Status Partially Met  SLP LONG TERM GOAL #3   Title Patient will report using accessibility features to read and reply to text messages x 3 visits.    Time 3    Period Weeks    Status Achieved      SLP LONG TERM GOAL #4   Title Pt will use compensations for processing mod complex written/verbal information with mod I x 2 sessions.    Time 1    Period Weeks    Status Not Met      SLP LONG TERM GOAL #5   Title Pt will complete mod complex functional problem solving activities >90% accuracy with extra time.    Time 6    Period Weeks    Status Not met/deferred      SLP LONG TERM GOAL #6   Title Pt will read and comprehend 3-4 paragraph with 85% accuracy and occasional min A with extended time    Time 6    Period Weeks    Status Not met/deferred      SLP LONG TERM GOAL #7   Title Pt will write 3-4 sentence paragraph, ID and correct 70% of errors with occasional min A    Time 6    Period Weeks    Status Not met/deferred           Plan - 06/18/20 0902    Clinical Impression Statement [name] continues to present with mild verbal expressive aphasia, moderate reading comprehension  mild written expression impairments. Reading comprehension consistently requires significant extended time at phrase/short sentence level. Mild cognitive impairments of attention, memory and processing persist. [name] and his family carryover strategies to compensate for cognitive impairments, however [name] is relying on [name] to solve more complex functional tasks and communication interaction. .[name]'s verbal expression is functional in a simple conversation, and moderately complex conversatoin continues to require  extended time, verbal cues and quesitoning cues  with significant halting for thought organizatoinand word finding difficulties. In this SLP's opinion, this is less-noticable when [name] feels more comfortable. 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 He agrees with discharge today with the knowledge he can return if he desires for another ST evaluation to compare his abilities then with where he is now, and determination of re-initiation of ST would be made at that time. He is pursuing ST at [employer] [facility] for the summer months, and has been involved in their aphasia group and enjoys this very much.    Treatment/Interventions Language facilitation;Environmental controls;Cueing hierarchy;SLP instruction and feedback;Cognitive reorganization;Compensatory techniques;Functional tasks;Compensatory strategies;Internal/external aids;Multimodal communcation approach;Patient/family education    Potential to Achieve Goals Good           Patient will benefit from skilled therapeutic intervention in order to improve the following deficits and impairments:   Aphasia  Cognitive communication deficit    Problem List Patient Active Problem List   Diagnosis Date Noted  . Aortic calcification ([place]) 02/22/2020  . Carotid artery calcification, bilateral 02/22/2020  . MDD (major depressive disorder), recurrent episode ([place])   . GERD (gastroesophageal reflux disease)   . Ischemic stroke ([place]), occipital   . Diabetic polyneuropathy associated with type 2 diabetes mellitus ([place]) 06/30/2018  . Erectile dysfunction due to type 2 diabetes mellitus ([place]) 06/30/2018  . Circulation disorder of lower extremity 05/31/2015  . IBS (irritable bowel syndrome) 12/24/2014  . 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 Diabetic leg ulcer ([place]) 07/18/2013  . Restless  legs syndrome (RLS) 11/20/2012  . Generalized anxiety disorder   . Type 2 diabetes mellitus with vascular disease ([place])   . Hyperlipidemia LDL goal <70 01/16/2009  . TOBACCO ABUSE 07/27/2007  . Morbid obesity ([place]) 09/29/2006  . Essential hypertension 09/29/2006  . CALCULUS, KIDNEY 05/30/2002    [facility] ,MS, CCC-SLP  06/18/2020, 9:11 AM  [facility] [address] [place], [place], [address] Phone: [phone]   Fax:  [phone]   Name: Limuel Nieblas MRN: [id number] Date of Birth: 22-Oct-1960

## 2020-06-17 NOTE — Therapy (Signed)
[facility] [address] [place], [place], [address] Phone: [phone]   Fax:  [phone]  Physical Therapy Treatment/Discharge Summary  Patient Details  Name: [name] [name] MRN: [id number] Date of Birth: 1961-01-07 Referring Provider (PT): Dr. Edilia Bo  PHYSICAL THERAPY DISCHARGE SUMMARY  Visits from Start of Care: 15  Current functional level related to goals / functional outcomes: See Clinical Impression Statement for Details   Remaining deficits: Decreased Endurance, Decreased Balance   Education / Equipment: HEP/Walking Program Provided  Plan: Patient agrees to discharge.  Patient goals were partially met. Patient is being discharged due to meeting the stated rehab goals.  ?????       Encounter Date: 06/17/2020    PT End of Session - 06/17/20 1236    Visit Number 15    Number of Visits 17    Date for PT Re-Evaluation 06/16/20    Authorization Type [employer] Managed    PT Start Time 1232    PT Stop Time 1310    PT Time Calculation (min) 38 min    Equipment Utilized During Treatment Gait belt    Activity Tolerance Patient tolerated treatment well    Behavior During Therapy WFL for tasks assessed/performed           Past Medical History:  Diagnosis Date  . Angio-edema   . Anxiety   . Depression   . Diabetes mellitus type II    type 2  . GERD (gastroesophageal reflux disease)   . Headache    migraine  . History of kidney stones   . HLD (hyperlipidemia)   . HTN (hypertension)   . Morbid obesity ([place])   . Restless legs syndrome (RLS) 11/20/2012  . Stroke ([place]) 02/14/2020   ischemic/right sided affected  . Tobacco abuse   . Ulcer of left ankle ([facility])    last 2 months dime size dry dressing changing q day  . Urticaria     Past Surgical History:  Procedure Laterality Date  . Arm surgery Right 1969   Abscess excision and debridement at elbow  . COLONOSCOPY WITH PROPOFOL N/A 09/06/2016    Procedure: COLONOSCOPY WITH PROPOFOL;  Surgeon: Doran Stabler, MD;  [facility];  Service: Gastroenterology;  Laterality: N/A;  . I & D EXTREMITY Left 05/29/2014   Procedure: IRRIGATION AND DEBRIDEMENT OF LEFT LEG WOUND AND PLACEMENT OF  INTEGRA  AND  VAC;  Surgeon: Theodoro Kos, DO;  Location: [facility];  Service: Plastics;  Laterality: Left;  . implantable loop recorder implant  03/06/2020   Medtronic Reveal [place] model LNQ11 (SN  [id number]) implanted by Dr Rayann Heman for cryptogenic stroke  . INCISION AND DRAINAGE OF WOUND Left 04/23/2014   Procedure: IRRIGATION AND DEBRIDEMENT LOWER LEFT LEG WOUND WITH PLACEMENT OF INTEGRA AND VAC;  Surgeon: Theodoro Kos, DO;  Location: [facility];  Service: Plastics;  Laterality: Left;  . kidney stone removal  2004  . MINOR APPLICATION OF WOUND VAC Left 04/23/2014   Procedure: MINOR APPLICATION OF WOUND VAC;  Surgeon: Theodoro Kos, DO;  Location: [facility];  Service: Plastics;  Laterality: Left;  . SKIN SPLIT GRAFT Left 06/18/2014   Procedure: SKIN GRAFT SPLIT THICKNESS TO LOWER LEFT LEG WOUND WITH PLACEMENT OF VAC;  Surgeon: Theodoro Kos, DO;  Location: [facility];  Service: Plastics;  Laterality: Left;  Marland Kitchen VASECTOMY  1994    There were no vitals filed for this visit.   Subjective Assessment -  06/17/20 1233    Subjective Patient reports no new changes. No falls. Exercises are going really well. Patient reports also lost more weight. Patient requesting to take break from PT services at this time.    Patient is accompained by: Family member    Pertinent History hypertension, hyperlipidemia, type 2 diabetes mellitus, restless leg syndrome, diabetic neuropathy, morbid obesity, tobacco abuse, depression/anxiety, GERD, chronic venous stasis ulcer in bilateral lower extremities    Limitations Reading;Walking;House hold activities    How long can you sit comfortably? no issues    How long  can you stand comfortably? 10 min    How long can you walk comfortably? 5 min    Diagnostic tests CT head showed Hyperdense right MCA.  CTA head and neck and perfusion study showed 10 cc region of completed infarction in left occipital cortex and additional 11 cc at risk/penumbra. [name] (wife) reports that short term memory is affected.    Patient Stated Goals improve walking    Currently in Pain? No/denies    Pain Onset More than a month ago              [facility] PT Assessment - 06/17/20 0001      Observation/Other Assessments   Focus on Therapeutic Outcomes (FOTO)  60%                         [facility] Adult PT Treatment/Exercise - 06/17/20 0001      Transfers   Transfers Sit to Stand;Stand to Sit    Sit to Stand 6: Modified independent (Device/Increase time)    Five time sit to stand comments  10.88 secs without UE support from mat at standard chair    Stand to Sit 6: Modified independent (Device/Increase time)      Ambulation/Gait   Ambulation/Gait Yes    Ambulation/Gait Assistance 5: Supervision    Ambulation/Gait Assistance Details throughout therapy gym with activities    Assistive device None    Gait Pattern Step-through pattern    Ambulation Surface Level;Indoor      Standardized Balance Assessment   Standardized Balance Assessment Dynamic Gait Index      Dynamic Gait Index   Level Surface Mild Impairment    Change in Gait Speed Mild Impairment    Gait with Horizontal Head Turns Mild Impairment    Gait with Vertical Head Turns Normal    Gait and Pivot Turn Normal    Step Over Obstacle Mild Impairment    Step Around Obstacles Normal    Steps Mild Impairment    Total Score 19      Therapeutic Activites    Therapeutic Activities Other Therapeutic Activities    Other Therapeutic Activities Completed simulation of squat:  patient able to demonstrate ability to squat and lift crate with 25# in it. Good form and no imbalance noted.      Exercises    Exercises Other Exercises    Other Exercises  Completed verbal review of current HEP, educating on compliance of HEP/walking program. Educated to continue to gradually increase time with walking to achieve approx 35-45 mins/daily for improved endurance.                  PT Education - 06/17/20 1308    Education Details progress toward LTG; verbal HEP review, and importance of HEP/walking program compliance    Person(s) Educated Patient    Methods Explanation    Comprehension Verbalized understanding  PT Short Term Goals - 03/25/20 1131      PT SHORT TERM GOAL #1   Title patient will be able to walk with or without AD for 10 min to improve walking endurance    Baseline <5 min without AD, 12 minutes with SPC    Time 4    Period Weeks    Status Achieved    Target Date 03/24/20             PT Long Term Goals - 06/17/20 1242      PT LONG TERM GOAL #1   Title Pt will be able to ambulate for 30 min with or without AD to improve walking endurance (TARGET DATE 06/16/20)    Baseline <5 min without AD; able to amb 30 min with rest breaks (04/21/20)    Time 8    Period Weeks    Status Partially Met      PT LONG TERM GOAL #2   Title Pt will demo 12 sec or less with 5x/ sit to stand without HHA to improve functional strength (TARGET DATE 06/16/20)    Baseline 15 sec without HHA; 13.41 sec (04/21/20); 10.88 secs without UE support    Time 8    Period Weeks    Status Achieved      PT LONG TERM GOAL #3   Title Pt will increase DGI score to at least 20/24 to demo decreased fall risk (TARGET DATE 06/16/20)    Baseline 17/24 on 04/21/20; 19/24 on 1/19    Time 8    Period Weeks    Status Not Met      PT LONG TERM GOAL #4   Title Pt and family would like pt to be able to squat at least 25# to demo improved bilat LE and trunk strength (TARGET DATE 06/16/20)    Baseline able to squat 25# with crate    Time 8    Period Weeks    Status Achieved      PT LONG TERM GOAL  #5   Title Pt would like to be able to ambulate 6 min at least 581 ft without a/d to demo improved endurance and community mobility (TARGET DATE 06/16/20)    Baseline 481' on 6 MWT with use of SPC (04/21/20); 652' no a/d (06/10/20)    Time 8    Period Weeks    Status Achieved      PT LONG TERM GOAL #6   Title Pt will have improved FOTO score to at least 66% (TARGET DATE 06/16/20)    Baseline 45% at eval; 60% on 06/17/20    Time 4    Period Weeks    Status Not Met                 Plan - 06/17/20 1324    Clinical Impression Statement Today's skilled session focused on assesment of patient's progress toward all LTGs. Patient able to meet all LTG, except #3 and #6. Patient still demonstrating decreased balance and increased fall risk with DGI score of 19/24. Patient has demonstrated progress in functional BLE strength and endurance. Patient requesting to take break from PT services at this time. Patient still presents with decreased endurance for age and impaired balance. PT completing verbal review of HEP, and educating on importance of compliance upon discharge.    Personal Factors and Comorbidities Comorbidity 3+;Time since onset of injury/illness/exacerbation    Comorbidities hypertension, hyperlipidemia, type 2 diabetes mellitus, restless leg syndrome, diabetic neuropathy, morbid  obesity, tobacco abuse, depression/anxiety, GERD, chronic venous stasis ulcer in bilateral lower extremities    Examination-Activity Limitations Squat;Stand;Stairs    Examination-Participation Restrictions Community Activity;Yard Work;Shop    Rehab Potential Good    PT Frequency 1x / week    PT Duration 8 weeks    PT Treatment/Interventions ADLs/Self Care Home Management;Gait training;Stair training;Functional mobility training;Therapeutic activities;Therapeutic exercise;Balance training;Neuromuscular re-education;Patient/family education;Manual techniques;Passive range of motion;Energy conservation;Joint  Manipulations    PT Home Exercise Plan Access Code: [email or web address] + Walking program: walking with family member for 5 min and gradually progressing it to 30 min    Consulted and Agree with Plan of Care Patient           Patient will benefit from skilled therapeutic intervention in order to improve the following deficits and impairments:  Abnormal gait,Decreased activity tolerance,Decreased mobility,Decreased endurance,Difficulty walking,Impaired sensation,Impaired vision/preception,Postural dysfunction  Visit Diagnosis: Muscle weakness (generalized)  Unsteadiness on feet  Other abnormalities of gait and mobility     Problem List Patient Active Problem List   Diagnosis Date Noted  . Aortic calcification ([place]) 02/22/2020  . Carotid artery calcification, bilateral 02/22/2020  . MDD (major depressive disorder), recurrent episode ([place])   . GERD (gastroesophageal reflux disease)   . Ischemic stroke ([place]), occipital   . Diabetic polyneuropathy associated with type 2 diabetes mellitus ([place]) 06/30/2018  . Erectile dysfunction due to type 2 diabetes mellitus ([place]) 06/30/2018  . Circulation disorder of lower extremity 05/31/2015  . IBS (irritable bowel syndrome) 12/24/2014  . 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 Diabetic leg ulcer ([place]) 07/18/2013  . Restless legs syndrome (RLS) 11/20/2012  . Generalized anxiety disorder   . Type 2 diabetes mellitus with vascular disease ([place])   . Hyperlipidemia LDL goal <70 01/16/2009  . TOBACCO ABUSE 07/27/2007  . Morbid obesity ([place]) 09/29/2006  . Essential hypertension 09/29/2006  . CALCULUS, KIDNEY 05/30/2002    Jones Bales, PT, DPT 06/17/2020, 1:27 PM  [facility] [address] [place], [place], [address] Phone: [phone]   Fax:  [phone]  Name: Baylen Buckner MRN: [id number] Date of Birth: 1960/10/10

## 2020-06-17 NOTE — Therapy (Signed)
[facility] [address] [place], [place], [address] Phone: [phone]   Fax:  [phone]  Occupational Therapy Treatment  Patient Details  Name: [name] [name] MRN: [id number] Date of Birth: 02-Dec-1960 Referring Provider (OT): Dr. Erlinda Hong   Encounter Date: 06/17/2020   OT End of Session - 06/17/20 1337    Visit Number 18    Number of Visits 24    Date for OT Re-Evaluation 06/17/20    Authorization Type Cigna/Cigna managed    Authorization Time Period 60 days    OT Start Time 1318    OT Stop Time 1400    OT Time Calculation (min) 42 min    Activity Tolerance Patient tolerated treatment well;Other (comment)    Behavior During Therapy WFL for tasks assessed/performed           Past Medical History:  Diagnosis Date  . Angio-edema   . Anxiety   . Depression   . Diabetes mellitus type II    type 2  . GERD (gastroesophageal reflux disease)   . Headache    migraine  . History of kidney stones   . HLD (hyperlipidemia)   . HTN (hypertension)   . Morbid obesity ([place])   . Restless legs syndrome (RLS) 11/20/2012  . Stroke ([facility]) 02/14/2020   ischemic/right sided affected  . Tobacco abuse   . Ulcer of left ankle ([facility])    last 2 months dime size dry dressing changing q day  . Urticaria     Past Surgical History:  Procedure Laterality Date  . Arm surgery Right 1969   Abscess excision and debridement at elbow  . COLONOSCOPY WITH PROPOFOL N/A 09/06/2016   Procedure: COLONOSCOPY WITH PROPOFOL;  Surgeon: Doran Stabler, MD;  Location: [facility];  Service: Gastroenterology;  Laterality: N/A;  . I & D EXTREMITY Left 05/29/2014   Procedure: IRRIGATION AND DEBRIDEMENT OF LEFT LEG WOUND AND PLACEMENT OF  INTEGRA  AND  VAC;  Surgeon: Theodoro Kos, DO;  Location: [facility];  Service: Plastics;  Laterality: Left;  . implantable loop recorder implant  03/06/2020   Medtronic Reveal [place] model LNQ11 (SN   [id number]) implanted by Dr Rayann Heman for cryptogenic stroke  . INCISION AND DRAINAGE OF WOUND Left 04/23/2014   Procedure: IRRIGATION AND DEBRIDEMENT LOWER LEFT LEG WOUND WITH PLACEMENT OF INTEGRA AND VAC;  Surgeon: Theodoro Kos, DO;  Location: [facility];  Service: Plastics;  Laterality: Left;  . kidney stone removal  2004  . MINOR APPLICATION OF WOUND VAC Left 04/23/2014   Procedure: MINOR APPLICATION OF WOUND VAC;  Surgeon: Theodoro Kos, DO;  Location: [facility];  Service: Plastics;  Laterality: Left;  . SKIN SPLIT GRAFT Left 06/18/2014   Procedure: SKIN GRAFT SPLIT THICKNESS TO LOWER LEFT LEG WOUND WITH PLACEMENT OF VAC;  Surgeon: Theodoro Kos, DO;  Location: [facility];  Service: Plastics;  Laterality: Left;  Marland Kitchen VASECTOMY  1994    There were no vitals filed for this visit.   Subjective Assessment - 06/17/20 1337    Subjective  Deneis pain    Pertinent History CVA 02/14/20 Lt occipital lobe. PMH: 2 small previous strokes, DM, GERD, HLD, HTN (but now has issues w/ orthostasis)    Limitations no driving, loop recorder    Currently in Pain? No/denies                 Treatment: Organization task basic, therapist read aloud tasks and  pt wrote them down along with details(reading the list was very challenging for patient due to aphasia and visual deficits). Pt then correctly organized this steps based upon extra information.  Tabletop scanning task with 78 % accuracy for number cancellation 39M.                OT Education - 06/17/20 1514    Education Details discussion with pt/ wife regarding d/c from OT and that pt can get a new referral in the futureif he demonstrates improvements or a decline    Person(s) Educated Patient    Methods Explanation    Comprehension Verbalized understanding            OT Short Term Goals - 05/14/20 1431      OT SHORT TERM GOAL #1   Title Pt will be independent with HEP for Rt hand  coordination and Rt shoulder (low range strengthening)    Time 4    Period Weeks    Status Achieved   MET for coordination     OT SHORT TERM GOAL #2   Title Pt will be independent with visual scanning strategies to compensate for Rt visual field cut    Time 4    Period Weeks    Status Achieved      OT SHORT TERM GOAL #3   Title Improve coordination Rt hand to 28 sec or less for high level tasks    Baseline 33.91 sec    Time 4    Period Weeks    Status Achieved   25.56 sec     OT SHORT TERM GOAL #4   Title Pt will verbalize understanding with memory compensatory strategies and utilize for medication and financial management    Time 4    Period Weeks    Status Achieved      OT SHORT TERM GOAL #5   Title Pt will perform simple financial management tasks using calculator prn with supervision and no more than min v.c's    Time 4    Period Weeks    Status Achieved   min cues to copy numbers correctly from calculator to paper     OT SHORT TERM GOAL #6   Title Pt will perform simple stovetop task with supervision only    Time 4    Period Weeks    Status Partially Met   assist for cutting or to take something off stove or out of oven for safety d/t vision, supervision and cues required otherwise     OT SHORT TERM GOAL #7   Title Pt will perform tabletop scanning with 100% accuracy and environmental scanning with 75% or greater accuracy    Time 4    Period Weeks    Status Partially Met   met w/ environmental scanning (92%), but inconsistent with tabletop scanning - approx 80%            OT Long Term Goals - 06/17/20 1330      OT LONG TERM GOAL #1   Title Pt to return to cooking with direct supervision    Status Partially Met      OT LONG TERM GOAL #2   Status Partially Met   min v.c for attention to detail     OT LONG TERM GOAL #3   Title Pt will perform full financial management tasks at 100% accuracy with no more than min cues    Status Not Met   transposing #'s  OT LONG TERM GOAL #4   Title Pt will demo organizational and problem solving skills to complete and adapt a schedule prn with no more than min cues    Status Partially Met   Pt is able to organize a schedule of events if it is read out to him, due to aphasia and impaired reading ability pt demonstrates difficulty completing from written information     OT LONG TERM GOAL #5   Title Pt will perform environmental scanning with 90% or greater accuracy consistently    Status Achieved                 Plan - 06/17/20 1338    Clinical Impression Statement Pt demonstrates overall progress, however he did not fully achieve goals due to visual perceptual deficits and aphasia.    Occupational performance deficits (Please refer to evaluation for details): ADL's;IADL's;Work;Leisure    Body Structure / Function / Physical Skills ADL;Strength;Balance;UE functional use;IADL;Vision;Coordination;Mobility;Sensation;FMC    Cognitive Skills Memory;Perception;Problem Solve;Sequencing;Thought    Rehab Potential Good    Clinical Decision Making Several treatment options, min-mod task modification necessary    OT Frequency 2x / week    OT Duration --   weeks   OT Treatment/Interventions Self-care/ADL training;DME and/or AE instruction;Therapeutic activities;Therapeutic exercise;Cognitive remediation/compensation;Coping strategies training;Functional Mobility Training;Neuromuscular education;Visual/perceptual remediation/compensation;Patient/family education    Plan d/c OT    Consulted and Agree with Plan of Care Patient    Family Member Consulted wife           Patient will benefit from skilled therapeutic intervention in order to improve the following deficits and impairments:   Body Structure / Function / Physical Skills: ADL,Strength,Balance,UE functional use,IADL,Vision,Coordination,Mobility,Sensation,FMC Cognitive Skills: Memory,Perception,Problem Solve,Sequencing,Thought     Visit  Diagnosis: Muscle weakness (generalized)  Other lack of coordination  Visuospatial deficit  Other symptoms and signs involving cognitive functions following cerebral infarction  Right homonymous hemianopsia OCCUPATIONAL THERAPY DISCHARGE SUMMARY Current functional level related to goals / functional outcomes: Pt made progress towards goals, however he did not fully achieve goals due to visual and language deficits.   Remaining deficits: Visual deficits, cognitive deficits, aphasia   Education / Equipment: Pt was issued HEP. He demonstrates understanding. Plan: Patient agrees to discharge.  Patient goals were partially met.                                                                                                      ?????     Pt is being d/c as he has achieved maximium benefit at this time.   Problem List Patient Active Problem List   Diagnosis Date Noted  . Aortic calcification ([place] [facility]) 02/22/2020  . Carotid artery calcification, bilateral 02/22/2020  . MDD (major depressive disorder), recurrent episode ([facility])   . GERD (gastroesophageal reflux disease)   . Ischemic stroke ([facility]), occipital   . Diabetic polyneuropathy associated with type 2 diabetes mellitus ([facility]) 06/30/2018  . Erectile dysfunction due to type 2 diabetes mellitus ([facility]) 06/30/2018  . Circulation disorder of lower extremity 05/31/2015  . IBS (irritable bowel syndrome) 12/24/2014  . Chronic  ulcer of left leg with necrosis of muscle ([facility]) 04/23/2014  . Diabetic leg ulcer ([facility]) 07/18/2013  . Restless legs syndrome (RLS) 11/20/2012  . Generalized anxiety disorder   . Type 2 diabetes mellitus with vascular disease ([facility])   . Hyperlipidemia LDL goal <70 01/16/2009  . TOBACCO ABUSE 07/27/2007  . Morbid obesity ([facility]) 09/29/2006  . Essential hypertension 09/29/2006  . CALCULUS, KIDNEY 05/30/2002    Kierra Jezewski 06/17/2020, 3:21 PM Theone Murdoch, OTR/L [phone] Phone: [phone] 3:26 PM  06/17/20 [facility] [address] [place], [place], [address] Phone: [phone]   Fax:  [phone]  Name: [name] [name] MRN: [id number] Date of Birth: [birth year]

## 2020-06-17 NOTE — Patient Instructions (Signed)
   Discharge doesn't mean goodbye forever!  If you need to come back we can schedule you for a speech eval. We will compare what you are doing with how you were doing the last 1-2 therapy sessions.  After the evaluation there will be options for frequency (once a week, once every other week, twice a week, etc) of therapy and the duration (2 weeks, 4 weeks, 6 weeks, etc). Maybe we will not recommend therapy, but there will be some different compensations we would recommend, or some different sites for practice.   Use [facility] speech therapy - Dr. Shanda Bumps is wonderful!  Sometimes they do not offer therapies in the summer, and people come back to see [place] for the summer  Use [facility] for your reading practice.  You can use a "hybrid" approach for your reading - you read a sentence and then use the reading function on the phone for 4-5 minutes

## 2020-06-18 ENCOUNTER — Other Ambulatory Visit: Payer: Self-pay | Admitting: Family Medicine

## 2020-06-18 ENCOUNTER — Telehealth: Payer: Self-pay | Admitting: Endocrinology

## 2020-06-18 LAB — CBC WITH DIFFERENTIAL/PLATELET
Basophils Absolute: 0.1 10*3/uL (ref 0.0–0.2)
Basos: 1 %
EOS (ABSOLUTE): 0.2 10*3/uL (ref 0.0–0.4)
Eos: 2 %
Hematocrit: 48.6 % (ref 37.5–51.0)
Hemoglobin: 16.4 g/dL (ref 13.0–17.7)
Immature Grans (Abs): 0 10*3/uL (ref 0.0–0.1)
Immature Granulocytes: 0 %
Lymphocytes Absolute: 2.1 10*3/uL (ref 0.7–3.1)
Lymphs: 21 %
MCH: 29.7 pg (ref 26.6–33.0)
MCHC: 33.7 g/dL (ref 31.5–35.7)
MCV: 88 fL (ref 79–97)
Monocytes Absolute: 0.9 10*3/uL (ref 0.1–0.9)
Monocytes: 9 %
Neutrophils Absolute: 6.7 10*3/uL (ref 1.4–7.0)
Neutrophils: 67 %
Platelets: 225 10*3/uL (ref 150–450)
RBC: 5.53 x10E6/uL (ref 4.14–5.80)
RDW: 12.9 % (ref 11.6–15.4)
WBC: 10 10*3/uL (ref 3.4–10.8)

## 2020-06-18 LAB — MICROALBUMIN / CREATININE URINE RATIO
Creatinine, Urine: 71.2 mg/dL
Microalb/Creat Ratio: 41 mg/g creat — ABNORMAL HIGH (ref 0–29)
Microalbumin, Urine: 29.5 ug/mL

## 2020-06-18 LAB — LIPID PANEL
Chol/HDL Ratio: 3.2 ratio (ref 0.0–5.0)
Cholesterol, Total: 96 mg/dL — ABNORMAL LOW (ref 100–199)
HDL: 30 mg/dL — ABNORMAL LOW (ref >39)
LDL Chol Calc (NIH): 36 mg/dL (ref 0–99)
Triglycerides: 186 mg/dL — ABNORMAL HIGH (ref 0–149)
VLDL Cholesterol Cal: 30 mg/dL (ref 5–40)

## 2020-06-18 LAB — BASIC METABOLIC PANEL
BUN/Creatinine Ratio: 17 (ref 9–20)
BUN: 17 mg/dL (ref 6–24)
CO2: 24 mmol/L (ref 20–29)
Calcium: 9.9 mg/dL (ref 8.7–10.2)
Chloride: 104 mmol/L (ref 96–106)
Creatinine, Ser: 1.03 mg/dL (ref 0.76–1.27)
GFR calc Af Amer: 91 mL/min/{1.73_m2} (ref >59)
GFR calc non Af Amer: 79 mL/min/{1.73_m2} (ref >59)
Glucose: 126 mg/dL — ABNORMAL HIGH (ref 65–99)
Potassium: 4.1 mmol/L (ref 3.5–5.2)
Sodium: 141 mmol/L (ref 134–144)

## 2020-06-18 LAB — HEPATIC FUNCTION PANEL
ALT: 15 IU/L (ref 0–44)
AST: 13 IU/L (ref 0–40)
Albumin: 4.2 g/dL (ref 3.8–4.9)
Alkaline Phosphatase: 57 IU/L (ref 44–121)
Bilirubin Total: 0.5 mg/dL (ref 0.0–1.2)
Bilirubin, Direct: 0.16 mg/dL (ref 0.00–0.40)
Total Protein: 6.5 g/dL (ref 6.0–8.5)

## 2020-06-18 LAB — HEMOGLOBIN A1C
Est. average glucose Bld gHb Est-mCnc: 120 mg/dL
Hgb A1c MFr Bld: 5.8 % — ABNORMAL HIGH (ref 4.8–5.6)

## 2020-06-18 LAB — PSA, TOTAL AND FREE
PSA, Free Pct: 18 %
PSA, Free: 0.09 ng/mL
Prostate Specific Ag, Serum: 0.5 ng/mL (ref 0.0–4.0)

## 2020-06-18 NOTE — Telephone Encounter (Signed)
Patient's wife called to request the following new RX's-All for a 90 day supply with refills:  bromocriptine (PARLODEL) 2.5 MG tablet AND  metFORMIN (GLUCOPHAGE-XR) 500 MG 24 hr tablet  Be sent to   [id number] [place], [place] - [address] AT [facility] OF [address] Phone:  [phone]  Fax:  [phone]

## 2020-06-19 ENCOUNTER — Encounter: Payer: Self-pay | Admitting: Psychology

## 2020-06-19 ENCOUNTER — Other Ambulatory Visit: Payer: Self-pay | Admitting: *Deleted

## 2020-06-19 MED ORDER — BROMOCRIPTINE MESYLATE 2.5 MG PO TABS: 1.2500 mg | ORAL_TABLET | Freq: Every day | ORAL | 3 refills | Status: DC

## 2020-06-19 MED ORDER — METFORMIN HCL ER 500 MG PO TB24: ORAL_TABLET | ORAL | 1 refills | Status: DC

## 2020-06-19 NOTE — Telephone Encounter (Signed)
Rx's have been sent. 

## 2020-06-22 ENCOUNTER — Ambulatory Visit (INDEPENDENT_AMBULATORY_CARE_PROVIDER_SITE_OTHER): Payer: Managed Care, Other (non HMO) | Admitting: Family Medicine

## 2020-06-22 ENCOUNTER — Encounter: Payer: Self-pay | Admitting: Family Medicine

## 2020-06-22 ENCOUNTER — Other Ambulatory Visit: Payer: Self-pay

## 2020-06-22 VITALS — BP 120/72 | HR 95 | Temp 97.7°F | Ht 69.5 in | Wt 290.0 lb

## 2020-06-22 DIAGNOSIS — F410 Panic disorder [episodic paroxysmal anxiety] without agoraphobia: Secondary | ICD-10-CM

## 2020-06-22 DIAGNOSIS — Z0000 Encounter for general adult medical examination without abnormal findings: Secondary | ICD-10-CM | POA: Diagnosis not present

## 2020-06-22 DIAGNOSIS — F330 Major depressive disorder, recurrent, mild: Secondary | ICD-10-CM

## 2020-06-22 MED ORDER — PAROXETINE HCL 20 MG PO TABS: 60.0000 mg | ORAL_TABLET | Freq: Every day | ORAL | 1 refills | Status: DC

## 2020-06-22 MED ORDER — PAROXETINE HCL 20 MG PO TABS
60.0000 mg | ORAL_TABLET | Freq: Every day | ORAL | 1 refills | Status: DC
Start: 2020-06-22 — End: 2020-06-22

## 2020-06-22 NOTE — Progress Notes (Signed)
Ardel Jagger T. Mirabel Ahlgren, MD, CAQ Sports Medicine  Primary Care and Sports Medicine [facility] at [facility] [address] [place], [address]  Phone: [phone]   FAX: [phone]  [name] [name] - 60 y.o. male   MRN [id number]   Date of Birth: 05-04-1961  Date: 06/22/2020   PCP: Hannah Beat, MD   Referral: Hannah Beat, MD  Chief Complaint  Patient presents with   Annual Exam    This visit occurred during the SARS-CoV-2 public health emergency.  Safety protocols were in place, including screening questions prior to the visit, additional usage of staff PPE, and extensive cleaning of exam room while observing appropriate contact time as indicated for disinfecting solutions.   Patient Care Team: Hannah Beat, MD as PCP - General Cherlyn Roberts, MD as Consulting Physician (Dermatology) Luretha Murphy, MD as Consulting Physician (General Surgery) Subjective:   [name] [name] is a 60 y.o. pleasant patient who presents with the following:  Preventative Health Maintenance Visit:  Health Maintenance Summary Reviewed and updated, unless pt declines services.  Tobacco History Reviewed. Alcohol: No concerns, no excessive use Exercise Habits: Some activity, rec at least 30 mins 5 times a week STD concerns: no risk or activity to increase risk Drug Use: None  He has lost > 100 pounds  Lab Results  Component Value Date   HGBA1C 5.8 (H) 06/17/2020    F/u CVA, he is still having post-stroke problems from a cognitive standpoint, aphasia, anxiety, and he has an upcoming neuropsych consultation from Dr. Vella Kohler.  He has been d/c from PT and OT at this point.  BP Readings from Last 3 Encounters:  06/22/20 120/72  04/22/20 110/60  04/02/20 (!) 141/82   Wt Readings from Last 3 Encounters:  06/22/20 290 lb (131.5 kg)  04/22/20 (!) 308 lb 4 oz (139.8 kg)  04/10/20 (!) 307 lb (139.3 kg)    Has been doing therapy with multiple disciplines and  has been d/c with some deficits.  07/01/2020 - end of short-term discibility He needs additional paperwork completed He is applying for long-term disability, as well.  Ongoing with depression.  He is feeling sad, and he is having some discrete panic attacks daily now.  This has improved after increasing his Paxil to 40 mg.  Got kB, mat, bands.  Anxiety and depression.  Happens but it is not too bad.  $ is some concerns. Once in a while wih some crying.  Sleeping great.  Wants to do some things / energy has improved.  Will fixate and worries about daughters.  Bills.  Has to do something else.   Health Maintenance  Topic Date Due   HEMOGLOBIN A1C  12/15/2020   FOOT EXAM  01/16/2021   OPHTHALMOLOGY EXAM  03/18/2021   TETANUS/TDAP  04/20/2026   COLONOSCOPY (Pts 45-76yrs Insurance coverage will need to be confirmed)  09/07/2026   INFLUENZA VACCINE  Completed   PNEUMOCOCCAL POLYSACCHARIDE VACCINE AGE 94-64 HIGH RISK  Completed   COVID-19 Vaccine  Completed   Hepatitis C Screening  Completed   HIV Screening  Completed   Immunization History  Administered Date(s) Administered   Influenza,inj,Quad PF,6+ Mos 04/20/2016, 03/23/2017, 03/19/2018, 02/06/2019, 02/20/2020   Influenza-Unspecified 03/31/2015   Janssen (J&J) SARS-COV-2 Vaccination 09/27/2019, 04/17/2020   Pneumococcal Polysaccharide-23 11/25/2010, 04/20/2016   Tdap 04/20/2016   Patient Active Problem List   Diagnosis Date Noted   Ischemic stroke [facility]), occipital     Priority: High   Diabetic leg ulcer (HCC) 07/18/2013  Priority: High   Type 2 diabetes mellitus with vascular disease (HCC)     Priority: High   Morbid obesity (HCC) 09/29/2006    Priority: High   Diabetic polyneuropathy associated with type 2 diabetes mellitus (HCC) 06/30/2018    Priority: Medium   Circulation disorder of lower extremity 05/31/2015    Priority: Medium   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)  04/23/2014    Priority: Medium   Hyperlipidemia LDL goal <70 01/16/2009    Priority: Medium   TOBACCO ABUSE 07/27/2007    Priority: Medium   Essential hypertension 09/29/2006    Priority: Medium   Panic attacks 06/23/2020   Aortic calcification (HCC) 02/22/2020   Carotid artery calcification, bilateral 02/22/2020   MDD (major depressive disorder), recurrent episode (HCC)    GERD (gastroesophageal reflux disease)    Erectile dysfunction due to type 2 diabetes mellitus (HCC) 06/30/2018   IBS (irritable bowel syndrome) 12/24/2014   Restless legs syndrome (RLS) 11/20/2012   Generalized anxiety disorder    CALCULUS, KIDNEY 05/30/2002    Past Medical History:  Diagnosis Date   Angio-edema    Anxiety    Depression    Diabetes mellitus type II    type 2   GERD (gastroesophageal reflux disease)    Headache    migraine   History of kidney stones    HLD (hyperlipidemia)    HTN (hypertension)    Morbid obesity (HCC)    Restless legs syndrome (RLS) 11/20/2012   Stroke (HCC) 02/14/2020   ischemic/right sided affected   Tobacco abuse    Ulcer of left ankle (HCC)    last 2 months dime size dry dressing changing q day   Urticaria     Past Surgical History:  Procedure Laterality Date   Arm surgery Right 1969   Abscess excision and debridement at elbow   COLONOSCOPY WITH PROPOFOL N/A 09/06/2016   Procedure: COLONOSCOPY WITH PROPOFOL;  Surgeon: Sherrilyn RistHenry L Danis III, MD;  Location: [facility];  Service: Gastroenterology;  Laterality: N/A;   I & D EXTREMITY Left 05/29/2014   Procedure: IRRIGATION AND DEBRIDEMENT OF LEFT LEG WOUND AND PLACEMENT OF  INTEGRA  AND  VAC;  Surgeon: Wayland Denislaire Sanger, DO;  Location: [facility];  Service: Plastics;  Laterality: Left;   implantable loop recorder implant  03/06/2020   Medtronic Reveal HanoverLinq model LNQ11 (SN  [id number]) implanted by Dr Johney FrameAllred for cryptogenic stroke   INCISION AND DRAINAGE OF WOUND Left  04/23/2014   Procedure: IRRIGATION AND DEBRIDEMENT LOWER LEFT LEG WOUND WITH PLACEMENT OF INTEGRA AND VAC;  Surgeon: Wayland Denislaire Sanger, DO;  Location: [facility];  Service: Plastics;  Laterality: Left;   kidney stone removal  2004   MINOR APPLICATION OF WOUND VAC Left 04/23/2014   Procedure: MINOR APPLICATION OF WOUND VAC;  Surgeon: Wayland Denislaire Sanger, DO;  Location: [facility];  Service: Plastics;  Laterality: Left;   SKIN SPLIT GRAFT Left 06/18/2014   Procedure: SKIN GRAFT SPLIT THICKNESS TO LOWER LEFT LEG WOUND WITH PLACEMENT OF VAC;  Surgeon: Wayland Denislaire Sanger, DO;  Location: [facility];  Service: Plastics;  Laterality: Left;   VASECTOMY  1994    Family History  Problem Relation Age of Onset   Stroke Mother        multiple   Leukemia Mother    Pneumonia Mother    Cancer Mother    Diabetes Mother    Diabetes Father    Heart  disease Father    Hyperlipidemia Father    Hypertension Father    Allergic rhinitis Neg Hx    Angioedema Neg Hx    Asthma Neg Hx    Eczema Neg Hx    Immunodeficiency Neg Hx    Urticaria Neg Hx     Past Medical History, Surgical History, Social History, Family History, Problem List, Medications, and Allergies have been reviewed and updated if relevant.  Review of Systems: Pertinent positives are listed above.  Otherwise, a full 14 point review of systems has been done in full and it is negative except where it is noted positive.  Objective:   BP 120/72    Pulse 95    Temp 97.7 F (36.5 C) (Temporal)    Ht 5' 9.5" (1.765 m)    Wt 290 lb (131.5 kg)    SpO2 96%    BMI 42.21 kg/m  Ideal Body Weight: Weight in (lb) to have BMI = 25: 171.4  Ideal Body Weight: Weight in (lb) to have BMI = 25: 171.4 No exam data present Depression screen [facility] 2/9 06/22/2020 06/28/2018 06/26/2017  Decreased Interest 0 0 0  Down, Depressed, Hopeless 1 0 0  PHQ - 2 Score 1 0 0  Altered sleeping 0 - -  Tired, decreased energy 0 -  -  Change in appetite 0 - -  Feeling bad or failure about yourself  1 - -  Trouble concentrating 1 - -  Moving slowly or fidgety/restless 1 - -  Suicidal thoughts 0 - -  PHQ-9 Score 4 - -  Difficult doing work/chores Somewhat difficult - -  Some recent data might be hidden     GEN: well developed, well nourished, no acute distress Eyes: conjunctiva and lids normal, PERRLA, EOMI ENT: TM clear, nares clear, oral exam WNL Neck: supple, no lymphadenopathy, no thyromegaly, no JVD Pulm: clear to auscultation and percussion, respiratory effort normal CV: regular rate and rhythm, S1-S2, no murmur, rub or gallop, no bruits, peripheral pulses normal and symmetric, no cyanosis, clubbing, edema or varicosities GI: soft, non-tender; no hepatosplenomegaly, masses; active bowel sounds all quadrants GU: deferred Lymph: no cervical, axillary or inguinal adenopathy MSK: gait normal, muscle tone and strength WNL, no joint swelling, effusions, discoloration, crepitus  SKIN: clear, good turgor, color WNL, no rashes, lesions, or ulcerations Neuro: normal mental status, normal strength, sensation, and motion. Psych: alert; oriented to person, place and time, normally interactive.  He does require some redirection.  He does have some expressive aphasia as well, and he asks his wife for clarity on some questions.   All labs reviewed with patient. Results for orders placed or performed in visit on 06/17/20  PSA, total and free  Result Value Ref Range   Prostate Specific Ag, Serum 0.5 0.0 - 4.0 ng/mL   PSA, Free 0.09 N/A ng/mL   PSA, Free Pct 18.0 %  Basic metabolic panel  Result Value Ref Range   Glucose 126 (H) 65 - 99 mg/dL   BUN 17 6 - 24 mg/dL   Creatinine, Ser 3.15 0.76 - 1.27 mg/dL   GFR calc non Af Amer 79 >59 mL/min/1.73   GFR calc Af Amer 91 >59 mL/min/1.73   BUN/Creatinine Ratio 17 9 - 20   Sodium 141 134 - 144 mmol/L   Potassium 4.1 3.5 - 5.2 mmol/L   Chloride 104 96 - 106 mmol/L   CO2  24 20 - 29 mmol/L   Calcium 9.9 8.7 - 10.2 mg/dL  Hepatic  function panel  Result Value Ref Range   Total Protein 6.5 6.0 - 8.5 g/dL   Albumin 4.2 3.8 - 4.9 g/dL   Bilirubin Total 0.5 0.0 - 1.2 mg/dL   Bilirubin, Direct 3.32 0.00 - 0.40 mg/dL   Alkaline Phosphatase 57 44 - 121 IU/L   AST 13 0 - 40 IU/L   ALT 15 0 - 44 IU/L  CBC with Differential/Platelet  Result Value Ref Range   WBC 10.0 3.4 - 10.8 x10E3/uL   RBC 5.53 4.14 - 5.80 x10E6/uL   Hemoglobin 16.4 13.0 - 17.7 g/dL   Hematocrit 95.1 88.4 - 51.0 %   MCV 88 79 - 97 fL   MCH 29.7 26.6 - 33.0 pg   MCHC 33.7 31.5 - 35.7 g/dL   RDW 16.6 06.3 - 01.6 %   Platelets 225 150 - 450 x10E3/uL   Neutrophils 67 Not Estab. %   Lymphs 21 Not Estab. %   Monocytes 9 Not Estab. %   Eos 2 Not Estab. %   Basos 1 Not Estab. %   Neutrophils Absolute 6.7 1.4 - 7.0 x10E3/uL   Lymphocytes Absolute 2.1 0.7 - 3.1 x10E3/uL   Monocytes Absolute 0.9 0.1 - 0.9 x10E3/uL   EOS (ABSOLUTE) 0.2 0.0 - 0.4 x10E3/uL   Basophils Absolute 0.1 0.0 - 0.2 x10E3/uL   Immature Granulocytes 0 Not Estab. %   Immature Grans (Abs) 0.0 0.0 - 0.1 x10E3/uL  Microalbumin / creatinine urine ratio  Result Value Ref Range   Creatinine, Urine 71.2 Not Estab. mg/dL   Microalbumin, Urine 01.0 Not Estab. ug/mL   Microalb/Creat Ratio 41 (H) 0 - 29 mg/g creat  Hemoglobin A1c  Result Value Ref Range   Hgb A1c MFr Bld 5.8 (H) 4.8 - 5.6 %   Est. average glucose Bld gHb Est-mCnc 120 mg/dL  Lipid panel  Result Value Ref Range   Cholesterol, Total 96 (L) 100 - 199 mg/dL   Triglycerides 932 (H) 0 - 149 mg/dL   HDL 30 (L) >35 mg/dL   VLDL Cholesterol Cal 30 5 - 40 mg/dL   LDL Chol Calc (NIH) 36 0 - 99 mg/dL   Chol/HDL Ratio 3.2 0.0 - 5.0 ratio    Assessment and Plan:     ICD-10-CM   1. Healthcare maintenance  Z00.00   2. Mild episode of recurrent major depressive disorder (HCC)  F33.0   3. Panic attacks  F41.0    He continues to have some post-stroke deficits.  He has  completed PT and OT, and he has lost 100 pounds, gotten in better physical shape with much improved a1c.  Post-stroke, he continues to have deficits that will likely be permanent.  He is being followed by Dr. Pearlean Brownie, and his neurocognitive assessment is pending with Dr. Vella Kohler.  For depression and anxiety, he is doing better, but not at baseline.  Will increase Paxil to 60 mg with follow-up.  Health Maintenance Exam: The patient's preventative maintenance and recommended screening tests for an annual wellness exam were reviewed in full today. Brought up to date unless services declined.  Counselled on the importance of diet, exercise, and its role in overall health and mortality. The patient's FH and SH was reviewed, including their home life, tobacco status, and drug and alcohol status.  Follow-up in 1 year for physical exam or additional follow-up below.  Follow-up: Return in about 3 months (around 09/20/2020) for anxiety follow-up. Or follow-up in 1 year if not noted.  Meds ordered this encounter  Medications   DISCONTD: PARoxetine (PAXIL) 20 MG tablet    Sig: Take 3 tablets (60 mg total) by mouth daily.    Dispense:  270 tablet    Refill:  1   PARoxetine (PAXIL) 20 MG tablet    Sig: Take 3 tablets (60 mg total) by mouth daily.    Dispense:  270 tablet    Refill:  1   Medications Discontinued During This Encounter  Medication Reason   PARoxetine (PAXIL) 40 MG tablet    PARoxetine (PAXIL) 20 MG tablet Reorder   No orders of the defined types were placed in this encounter.   Signed,  Elpidio GaleaSpencer T. Nieves Barberi, MD   Allergies as of 06/22/2020      Reactions   Cashew Nut Oil Anaphylaxis   Cashews, 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   Pistachio Nut (diagnostic) Hives, Shortness Of Breath, Itching, Swelling, Rash   Latex Dermatitis   Tape Other (See Comments)   Tears and bruises the skin   Betadine [povidone Iodine] Rash, Other (See Comments)   Raised rash 04/23/14   Lidocaine Other (See  Comments)   Minimal effect with lidocaine, prefers different anesthetic   Penicillins Rash, Other (See Comments)   Rash all over as a child      Medication List       Accurate as of June 22, 2020 11:59 PM. If you have any questions, ask your nurse or doctor.        acetaminophen 500 MG tablet Commonly known as: TYLENOL Take 1,000 mg by mouth 2 (two) times daily as needed (for pain).   aspirin 81 MG EC tablet Take 1 tablet (81 mg total) by mouth daily. Swallow whole.   bromocriptine 2.5 MG tablet Commonly known as: PARLODEL Take 0.5 tablets (1.25 mg total) by mouth at bedtime.   colesevelam 625 MG tablet Commonly known as: WELCHOL Take 2 tablets (1,250 mg total) by mouth daily.   Contour Next Test test strip Generic drug: glucose blood Use to check blood sugar two times a day.   dapagliflozin propanediol 10 MG Tabs tablet Commonly known as: Farxiga TAKE 1 TABLET BY MOUTH DAILY BEFORE BREAKFAST   EPINEPHrine 0.3 mg/0.3 mL Soaj injection Commonly known as: EPI-PEN INJECT 0.3 ML INTO THE  MUSCLE ONCE FOR 1 DOSE.   glipiZIDE 10 MG 24 hr tablet Commonly known as: GLUCOTROL XL Take 1 tablet (10 mg total) by mouth every morning. TAKE 2 TABLETS BY MOUTH  DAILY WITH BREAKFAST What changed: additional instructions   Lancets Micro Thin 33G Misc Use to check blood sugar two times daily.   lisinopril-hydrochlorothiazide 20-12.5 MG tablet Commonly known as: Zestoretic Take 0.5 tablets by mouth daily. What changed: how much to take   metFORMIN 500 MG 24 hr tablet Commonly known as: GLUCOPHAGE-XR Take 4 tablets by mouth daily with breakfast   multivitamin tablet Take 1 tablet by mouth daily.   pantoprazole 40 MG tablet Commonly known as: PROTONIX TAKE 1 TABLET BY MOUTH  DAILY   PARoxetine 20 MG tablet Commonly known as: Paxil Take 3 tablets (60 mg total) by mouth daily. What changed:  medication strength how much to take when to take this Changed by: Hannah BeatSpencer  Adriano Bischof, MD   pentoxifylline 400 MG CR tablet Commonly known as: TRENTAL TAKE 1 TABLET BY MOUTH 3  TIMES DAILY WITH MEALS   pregabalin 75 MG capsule Commonly known as: Lyrica Take 1 capsule (75 mg total) by mouth 3 (three) times daily.   rOPINIRole 0.5 MG tablet Commonly  known as: REQUIP TAKE 1 TABLET BY MOUTH  TWICE DAILY   rosuvastatin 20 MG tablet Commonly known as: CRESTOR Take 1 tablet (20 mg total) by mouth at bedtime.   sildenafil 100 MG tablet Commonly known as: VIAGRA Take 1 tablet (100 mg total) by mouth daily as needed for erectile dysfunction.   Trulicity 4.5 MG/0.5ML Sopn Generic drug: Dulaglutide INJECT 4.5 MG INTO THE SKIN ONCE A WEEK

## 2020-06-23 ENCOUNTER — Telehealth: Payer: Self-pay | Admitting: Family Medicine

## 2020-06-23 ENCOUNTER — Encounter: Payer: Self-pay | Admitting: Family Medicine

## 2020-06-23 DIAGNOSIS — F410 Panic disorder [episodic paroxysmal anxiety] without agoraphobia: Secondary | ICD-10-CM | POA: Insufficient documentation

## 2020-06-23 NOTE — Telephone Encounter (Signed)
Pt returned phone call.  

## 2020-06-23 NOTE — Telephone Encounter (Signed)
Called patient back. Filled paperwork and placed in PCP's inbox for review and signature.

## 2020-06-23 NOTE — Telephone Encounter (Signed)
Called patient to discuss FMLA. LVM to call back. Needing to know if going to be repeat of last years FMLA. If not, what changes are needing to be made.

## 2020-06-24 ENCOUNTER — Ambulatory Visit: Payer: Managed Care, Other (non HMO)

## 2020-06-25 NOTE — Telephone Encounter (Signed)
Paperwork has been faxed. Patient's wife notified.   Copy for patient Copy for scan

## 2020-06-30 NOTE — Progress Notes (Signed)
[facility] Summary Report / Loop Recorder 

## 2020-07-02 MED ORDER — PENTOXIFYLLINE ER 400 MG PO TBCR: EXTENDED_RELEASE_TABLET | ORAL | 3 refills | Status: DC

## 2020-07-02 NOTE — Addendum Note (Signed)
Addended by: Damita Lack on: 07/02/2020 02:16 PM   Modules accepted: Orders

## 2020-07-04 ENCOUNTER — Other Ambulatory Visit: Payer: Self-pay | Admitting: Family Medicine

## 2020-07-07 ENCOUNTER — Ambulatory Visit (INDEPENDENT_AMBULATORY_CARE_PROVIDER_SITE_OTHER): Payer: Managed Care, Other (non HMO) | Admitting: Neurology

## 2020-07-07 ENCOUNTER — Other Ambulatory Visit: Payer: Self-pay

## 2020-07-07 ENCOUNTER — Encounter: Payer: Self-pay | Admitting: Neurology

## 2020-07-07 VITALS — BP 140/85 | HR 80 | Ht 70.0 in | Wt 284.0 lb

## 2020-07-07 DIAGNOSIS — I639 Cerebral infarction, unspecified: Secondary | ICD-10-CM

## 2020-07-07 DIAGNOSIS — G3184 Mild cognitive impairment, so stated: Secondary | ICD-10-CM

## 2020-07-07 DIAGNOSIS — R4701 Aphasia: Secondary | ICD-10-CM | POA: Diagnosis not present

## 2020-07-07 NOTE — Progress Notes (Signed)
[facility] [address] [place]. [place] [address] [phone]       OFFICE CONSULT NOTE  [name] [name] Date of Birth:  1960-06-02 Medical Record Number:  [id number]   Referring MD: Osvaldo Shipper Reason for Referral: Stroke  HPI: [name] [name] is a 60 year old Caucasian male seen today for initial office consultation visit for stroke.  He is accompanied by his wife.  History is obtained from them and review of electronic medical records and I personally reviewed imaging films in [id number] [email or web address] medical history significant ofhypertension, hyperlipidemia, type 2 diabetes mellitus, restless leg syndrome, diabetic neuropathy, morbid obesity, tobacco abuse, depression/anxiety, GERD, chronic venous stasis ulcer in bilateral lower extremities presentedto emergency department with headache and right thigh vision loss started 2 AM on the morning of admission on 02/14/2020. He initially went to his ophthalmologist and was referred to the emergency department. Concern was for acute stroke. Patient was hospitalized for further management.  CT perfusion scan suggested a left occipital lobe infarct.  MRI was attempted but patient refused due to significant claustrophobia.  Subsequent repeat CT scan confirmed left posterior division MCA infarct.  CT angiogram showed no significant large vessel stenosis or occlusion.  Lower extremity venous Dopplers were negative for DVT.  Transthoracic echo showed normal ejection fraction without cardiac source of embolism.  LDL cholesterol was quite low at 15 mg percent and hemoglobin A1c was elevated 8.4.  Patient was started on aspirin and Plavix for 3 weeks followed by aspirin alone.  Patient states is done well since discharge she is now beginning to see some movement in the right peripheral field of vision but is still not able to count fingers.  He still has trouble reading and comprehending.  His speech has improved though it is still  slightly hesitant.  Is tolerating aspirin well without bruising or bleeding.  Patient is unable to return to work in [employer] and is currently on short-term disability.  He has not been driving.  He denies any prior history of atrial fibrillation, syncope or palpitations.  He had outpatient loop recorder placed by Dr. Johney Frame and so for paroxysmal A. fib has not yet been found.  He also complains of mild short-term memory and cognitive difficulties which are new since his stroke. Update 07/07/2020: He returns for follow-up after last visit 3 months ago.  Is accompanied by his wife.  Patient has noted improvement in his speech peripheral vision but thinks continues to have occasional word finding difficulties and has to speak in a slow.  He notices that if he tries to talk too fast or get excited he has more trouble.  He noticed slight improvement in his right-sided peripheral vision particularly in the quadrant though he still has significant vision loss in the lower quadrant and has not been driving.  He was recently seen by optometry wrist who asked him not to drive.  Patient had loop recorder inserted and so for paroxysmal A. fib has not yet been found.  He did participate in the [place] stroke trial and qualified and is on the trial medication.  Today he signed consent to be in the cognitive soft study of the trial.  He is tolerating the trial medications well without bleeding or bruising.  His blood pressure is well controlled and today it is borderline at 140/85.  He states his sugars are doing quite good and last A1c checked in January this year was 5.8.  His lipids are satisfactory.  He has been overall eating  a healthy diet and is quite controlled on what he eats and is overall lost 130 pounds over the last 1 year.  He is finished outpatient speech therapy but is enrolled to participate in [place] summer program for speech and language.His Paxil dose was recently increased by his primary care physician to help  with his underlying anxiety and stress.  He does have diabetic peripheral neuropathy but Lyrica seems to help his paresthesias quite well and is able to sleep well at night.  He works as an Product/process development scientist in [employer] but clearly due to his cognitive and speech difficulties he will not be able to work and may have to go on long-term disability.   ROS:   14 system review of systems is positive for vision loss, memory loss, speech difficulties difficulty with reading and comprehension, anxiety all other systems negative PMH:  Past Medical History:  Diagnosis Date  . Angio-edema   . Anxiety   . Depression   . Diabetes mellitus type II    type 2  . GERD (gastroesophageal reflux disease)   . Headache    migraine  . History of kidney stones   . HLD (hyperlipidemia)   . HTN (hypertension)   . Morbid obesity (HCC)   . Restless legs syndrome (RLS) 11/20/2012  . Stroke (HCC) 02/14/2020   ischemic/right sided affected  . Tobacco abuse   . Ulcer of left ankle (HCC)    last 2 months dime size dry dressing changing q day  . Urticaria     Social History:  Social History   Socioeconomic History  . Marital status: Married    Spouse name: Banker  . Number of children: 3  . Years of education: Not on file  . Highest education level: Not on file  Occupational History  . 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 Smoking status: Current Some Day Smoker    Packs/day: 0.25    Years: 40.00    Pack years: 10.00    Types: Cigarettes  . Smokeless tobacco: Never Used  . Tobacco comment: one cigarette daily  Substance and Sexual Activity  . Alcohol use: Yes    Alcohol/week: 0.0 standard drinks    Comment: rarely  . Drug use: No  . Sexual activity: Yes    Partners: Female  Other Topics Concern  . Not on file  Social History Narrative   Lives with wife   Right Handed   Drink 6-7 cups caffeine daily   Social  Determinants of Health   Financial Resource Strain: Not on file  Food Insecurity: Not on file  Transportation Needs: Not on file  Physical Activity: Not on file  Stress: Not on file  Social Connections: Not on file  Intimate Partner Violence: Not on file    Medications:   Current Outpatient Medications on File Prior to Visit  Medication Sig Dispense Refill  . acetaminophen (TYLENOL) 500 MG tablet Take 1,000 mg by mouth 2 (two) times daily as needed (for pain).    Marland Kitchen aspirin EC 81 MG EC tablet Take 1 tablet (81 mg total) by mouth daily. Swallow whole. 30 tablet 3  . bromocriptine (PARLODEL) 2.5 MG tablet Take 0.5 tablets (1.25 mg total) by mouth at bedtime. 45 tablet 3  . colesevelam (WELCHOL) 625 MG tablet Take 2 tablets (1,250 mg total) by mouth daily. 60 tablet 11  . dapagliflozin propanediol (FARXIGA) 10 MG TABS tablet TAKE  1 TABLET BY MOUTH DAILY BEFORE BREAKFAST 90 tablet 1  . EPINEPHrine 0.3 mg/0.3 mL IJ SOAJ injection INJECT 0.3 ML INTO THE  MUSCLE ONCE FOR 1 DOSE. 2 each 0  . glipiZIDE (GLUCOTROL XL) 10 MG 24 hr tablet Take 1 tablet (10 mg total) by mouth every morning. TAKE 2 TABLETS BY MOUTH  DAILY WITH BREAKFAST (Patient taking differently: Take 10 mg by mouth every morning.) 90 tablet 3  . glucose blood (CONTOUR NEXT TEST) test strip Use to check blood sugar two times a day. 100 each 5  . Lancets Micro Thin 33G MISC Use to check blood sugar two times daily. 100 each 5  . lisinopril-hydrochlorothiazide (ZESTORETIC) 20-12.5 MG tablet Take 0.5 tablets by mouth daily. (Patient taking differently: Take 1 tablet by mouth daily.) 45 tablet 3  . metFORMIN (GLUCOPHAGE-XR) 500 MG 24 hr tablet Take 4 tablets by mouth daily with breakfast 360 tablet 1  . Multiple Vitamin (MULTIVITAMIN) tablet Take 1 tablet by mouth daily.    . pantoprazole (PROTONIX) 40 MG tablet TAKE 1 TABLET BY MOUTH  DAILY 90 tablet 3  . PARoxetine (PAXIL) 20 MG tablet Take 3 tablets (60 mg total) by mouth daily. 270  tablet 1  . PARoxetine (PAXIL) 40 MG tablet TAKE 1 TABLET(40 MG) BY MOUTH EVERY MORNING 90 tablet 1  . pentoxifylline (TRENTAL) 400 MG CR tablet TAKE 1 TABLET BY MOUTH 3  TIMES DAILY WITH MEALS 270 tablet 3  . pregabalin (LYRICA) 75 MG capsule Take 1 capsule (75 mg total) by mouth 3 (three) times daily. 270 capsule 1  . rOPINIRole (REQUIP) 0.5 MG tablet TAKE 1 TABLET BY MOUTH  TWICE DAILY 180 tablet 3  . rosuvastatin (CRESTOR) 20 MG tablet Take 1 tablet (20 mg total) by mouth at bedtime. 90 tablet 0  . sildenafil (VIAGRA) 100 MG tablet Take 1 tablet (100 mg total) by mouth daily as needed for erectile dysfunction. 10 tablet 5  . TRULICITY 4.5 MG/0.5ML SOPN INJECT 4.5 MG INTO THE SKIN ONCE A WEEK 6 mL 3   No current facility-administered medications on file prior to visit.    Allergies:   Allergies  Allergen Reactions  . Cashew Nut Oil Anaphylaxis    Cashews, 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  Marland Kitchen Pistachio Nut (Diagnostic) Hives, Shortness Of Breath, Itching, Swelling and Rash  . Latex Dermatitis  . Tape Other (See Comments)    Tears and bruises the skin  . Betadine [Povidone Iodine] Rash and Other (See Comments)    Raised rash 04/23/14  . Lidocaine Other (See Comments)    Minimal effect with lidocaine, prefers different anesthetic  . Penicillins Rash and Other (See Comments)    Rash all over as a child    Physical Exam General: Obese middle-aged Caucasian male seated, in no evident distress Head: head normocephalic and atraumatic.   Neck: supple with no carotid or supraclavicular bruits Cardiovascular: regular rate and rhythm, no murmurs Musculoskeletal: no deformity Skin:  no rash/petichiae Vascular:  Normal pulses all extremities  Neurologic Exam Mental Status: Awake and fully alert. Oriented to place and time. Recent and remote memory intact. Attention span, concentration and fund of knowledge appropriate. Mood and affect appropriate.  Speech is slightly nonfluent, deliberate and  hesitant but no dysarthria or paraphasic errors.  Diminished recall 1/3.  Able to name only 6 animals which can walk on 4 legs. Cranial Nerves: Fundoscopic exam reveals not done. Pupils equal, briskly reactive to light. Extraocular movements full without nystagmus. Visual fields show partial  right homonymous hemianopsia to confrontation with more deficit in the inferior quadrant.Marland Kitchen Hearing intact. Facial sensation intact.  Mild right lower facial asymmetry.  Tongue, palate moves normally and symmetrically.  Motor: Normal bulk and tone. Normal strength in all tested extremity muscles. Sensory.: intact to touch , pinprick , position and vibratory sensation.  Coordination: Rapid alternating movements normal in all extremities. Finger-to-nose and heel-to-shin performed accurately bilaterally. Gait and Station: Arises from chair without difficulty. Stance is normal. Gait demonstrates normal stride length and balance . Able to heel, toe and tandem walk without difficulty.  Reflexes: 1+ and symmetric. Toes downgoing.   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  Vascular risk factors of diabetes, hypertension hyperlipidemia.  And obesity.  He also has persistent right-sided peripheral vision loss, mild expressive language difficulties mild post stroke cognitive impairment.     PLAN: I had a long d/w patient about his recent cryptogenic  stroke, risk for recurrent stroke/TIAs, personally independently reviewed imaging studies and stroke evaluation results and answered questions.Continue [place] stroke trial medication ( aspirin versus Eliquis )  for secondary stroke prevention and maintain strict control of hypertension with blood pressure goal below 130/90, diabetes with hemoglobin A1c goal below 6.5% and lipids with LDL cholesterol goal below 70 mg/dL. I also advised the patient to eat a healthy diet  with plenty of whole grains, cereals, fruits and vegetables, exercise regularly and maintain ideal body weight. Patient continues to have residual peripheral vision defect, language and cognitive difficulties from his stroke and will be unable to return and may need to consider long-term disability. Followup in the future as per [place] study protocol.Greater than 50% time during this 35-minute   visit was spent on counseling and coordination of care about his cryptogenic stroke discussion about stroke prevention treatment and answering questions. Delia Heady, MD Note: This document was prepared with digital dictation and possible smart phrase technology. Any transcriptional errors that result from this process are unintentional.

## 2020-07-07 NOTE — Patient Instructions (Signed)
I had a long d/w patient about his recent cryptogenic  stroke, risk for recurrent stroke/TIAs, personally independently reviewed imaging studies and stroke evaluation results and answered questions.Continue [place] stroke trial medication ( aspirin versus Eliquis )  for secondary stroke prevention and maintain strict control of hypertension with blood pressure goal below 130/90, diabetes with hemoglobin A1c goal below 6.5% and lipids with LDL cholesterol goal below 70 mg/dL. I also advised the patient to eat a healthy diet with plenty of whole grains, cereals, fruits and vegetables, exercise regularly and maintain ideal body weight. Patient continues to have residual peripheral vision defect, language and cognitive difficulties from his stroke and will be unable to return and may need to consider long-term disability. Followup in the future as per [place] study protocol.  Stroke Prevention Some medical conditions and behaviors are associated with a higher chance of having a stroke. You can help prevent a stroke by making nutrition, lifestyle, and other changes, including managing any medical conditions you may have. What nutrition changes can be made?  Eat healthy foods. You can do this by: ? Choosing foods high in fiber, such as fresh fruits and vegetables and whole grains. ? Eating at least 5 or more servings of fruits and vegetables a day. Try to fill half of your plate at each meal with fruits and vegetables. ? Choosing lean protein foods, such as lean cuts of meat, poultry without skin, fish, tofu, beans, and nuts. ? Eating low-fat dairy products. ? Avoiding foods that are high in salt (sodium). This can help lower blood pressure. ? Avoiding foods that have saturated fat, trans fat, and cholesterol. This can help prevent high cholesterol. ? Avoiding processed and premade foods.  Follow your health care provider's specific guidelines for losing weight, controlling high blood pressure  (hypertension), lowering high cholesterol, and managing diabetes. These may include: ? Reducing your daily calorie intake. ? Limiting your daily sodium intake to 1,500 milligrams (mg). ? Using only healthy fats for cooking, such as olive oil, canola oil, or sunflower oil. ? Counting your daily carbohydrate intake.   What lifestyle changes can be made?  Maintain a healthy weight. Talk to your health care provider about your ideal weight.  Get at least 30 minutes of moderate physical activity at least 5 days a week. Moderate activity includes brisk walking, biking, and swimming.  Do not use any products that contain nicotine or tobacco, such as cigarettes and e-cigarettes. If you need help quitting, ask your health care provider. It may also be helpful to avoid exposure to secondhand smoke.  Limit alcohol intake to no more than 1 drink a day for nonpregnant women and 2 drinks a day for men. One drink equals 12 oz of beer, 5 oz of wine, or 1 oz of hard liquor.  Stop any illegal drug use.  Avoid taking birth control pills. Talk to your health care provider about the risks of taking birth control pills if: ? You are over 107 years old. ? You smoke. ? You get migraines. ? You have ever had a blood clot. What other changes can be made?  Manage your cholesterol levels. ? Eating a healthy diet is important for preventing high cholesterol. If cholesterol cannot be managed through diet alone, you may also need to take medicines. ? Take any prescribed medicines to control your cholesterol as told by your health care provider.  Manage your diabetes. ? Eating a healthy diet and exercising regularly are important parts of managing your blood  sugar. If your blood sugar cannot be managed through diet and exercise, you may need to take medicines. ? Take any prescribed medicines to control your diabetes as told by your health care provider.  Control your hypertension. ? To reduce your risk of stroke,  try to keep your blood pressure below 130/80. ? Eating a healthy diet and exercising regularly are an important part of controlling your blood pressure. If your blood pressure cannot be managed through diet and exercise, you may need to take medicines. ? Take any prescribed medicines to control hypertension as told by your health care provider. ? Ask your health care provider if you should monitor your blood pressure at home. ? Have your blood pressure checked every year, even if your blood pressure is normal. Blood pressure increases with age and some medical conditions.  Get evaluated for sleep disorders (sleep apnea). Talk to your health care provider about getting a sleep evaluation if you snore a lot or have excessive sleepiness.  Take over-the-counter and prescription medicines only as told by your health care provider. Aspirin or blood thinners (antiplatelets or anticoagulants) may be recommended to reduce your risk of forming blood clots that can lead to stroke.  Make sure that any other medical conditions you have, such as atrial fibrillation or atherosclerosis, are managed. What are the warning signs of a stroke? The warning signs of a stroke can be easily remembered as BEFAST.  B is for balance. Signs include: ? Dizziness. ? Loss of balance or coordination. ? Sudden trouble walking.  E is for eyes. Signs include: ? A sudden change in vision. ? Trouble seeing.  F is for face. Signs include: ? Sudden weakness or numbness of the face. ? The face or eyelid drooping to one side.  A is for arms. Signs include: ? Sudden weakness or numbness of the arm, usually on one side of the body.  S is for speech. Signs include: ? Trouble speaking (aphasia). ? Trouble understanding.  T is for time. ? These symptoms may represent a serious problem that is an emergency. Do not wait to see if the symptoms will go away. Get medical help right away. Call your local emergency services (911 in  the [place] Do not drive yourself to the hospital.  Other signs of stroke may include: ? A sudden, severe headache with no known cause. ? Nausea or vomiting. ? Seizure. Where to find more information For more information, visit:  American Stroke Association: www.strokeassociation.org  National Stroke Association: www.stroke.org Summary  You can prevent a stroke by eating healthy, exercising, not smoking, limiting alcohol intake, and managing any medical conditions you may have.  Do not use any products that contain nicotine or tobacco, such as cigarettes and e-cigarettes. If you need help quitting, ask your health care provider. It may also be helpful to avoid exposure to secondhand smoke.  Remember BEFAST for warning signs of stroke. Get help right away if you or a loved one has any of these signs. This information is not intended to replace advice given to you by your health care provider. Make sure you discuss any questions you have with your health care provider. Document Revised: 04/28/2017 Document Reviewed: 06/21/2016 Elsevier Patient Education  2021 [employer].

## 2020-07-09 ENCOUNTER — Other Ambulatory Visit: Payer: Self-pay | Admitting: Neurology

## 2020-07-09 DIAGNOSIS — G309 Alzheimer's disease, unspecified: Secondary | ICD-10-CM

## 2020-07-09 MED ORDER — LORAZEPAM 1 MG PO TABS: 1.0000 mg | ORAL_TABLET | Freq: Once | ORAL | 0 refills | Status: AC

## 2020-07-15 ENCOUNTER — Ambulatory Visit: Payer: Managed Care, Other (non HMO) | Admitting: Endocrinology

## 2020-07-16 ENCOUNTER — Ambulatory Visit (INDEPENDENT_AMBULATORY_CARE_PROVIDER_SITE_OTHER): Payer: Managed Care, Other (non HMO)

## 2020-07-16 DIAGNOSIS — I639 Cerebral infarction, unspecified: Secondary | ICD-10-CM | POA: Diagnosis not present

## 2020-07-16 LAB — CUP PACEART REMOTE DEVICE CHECK
Date Time Interrogation Session: 20220217103413
Implantable Pulse Generator Implant Date: 20211008

## 2020-07-17 ENCOUNTER — Other Ambulatory Visit: Payer: Self-pay | Admitting: Family Medicine

## 2020-07-17 ENCOUNTER — Ambulatory Visit: Payer: Managed Care, Other (non HMO) | Admitting: Psychology

## 2020-07-21 ENCOUNTER — Ambulatory Visit: Payer: Managed Care, Other (non HMO) | Admitting: Psychology

## 2020-07-21 MED ORDER — LISINOPRIL 10 MG PO TABS: 10.0000 mg | ORAL_TABLET | Freq: Every day | ORAL | 3 refills | Status: DC

## 2020-07-22 ENCOUNTER — Ambulatory Visit: Payer: Managed Care, Other (non HMO) | Admitting: Psychology

## 2020-07-22 NOTE — Progress Notes (Signed)
[facility] Summary Report / Loop Recorder 

## 2020-07-23 ENCOUNTER — Ambulatory Visit: Payer: Managed Care, Other (non HMO) | Admitting: Family Medicine

## 2020-07-28 ENCOUNTER — Other Ambulatory Visit: Payer: Self-pay | Admitting: Family Medicine

## 2020-07-28 ENCOUNTER — Encounter: Payer: Self-pay | Admitting: *Deleted

## 2020-07-28 DIAGNOSIS — R569 Unspecified convulsions: Secondary | ICD-10-CM

## 2020-07-29 ENCOUNTER — Other Ambulatory Visit: Payer: Self-pay

## 2020-07-29 ENCOUNTER — Ambulatory Visit (HOSPITAL_COMMUNITY)
Admission: RE | Admit: 2020-07-29 | Discharge: 2020-07-29 | Disposition: A | Discharge status: Home / Self Care | Payer: Self-pay | Source: Ambulatory Visit | Attending: Neurology | Admitting: Neurology

## 2020-07-29 DIAGNOSIS — G309 Alzheimer's disease, unspecified: Secondary | ICD-10-CM | POA: Insufficient documentation

## 2020-07-30 ENCOUNTER — Telehealth: Payer: Self-pay

## 2020-07-30 ENCOUNTER — Telehealth: Payer: Self-pay | Admitting: Neurology

## 2020-07-30 NOTE — Telephone Encounter (Signed)
I called the pt per Renee U to get a transmission with him home monitor. I called both numbers and the patient did not answer. I will try again tomorrow.

## 2020-07-30 NOTE — Telephone Encounter (Signed)
Called patient to advise that no events were found on his device of the lifetime. States he found out he has 3 new areas of stroke and was wondering If we could see any AF on recorder. Wife is asking about settings on on device. Advised I will forward to [facility] who may can help with questions in regards to if the device is programmed like it should be.

## 2020-07-30 NOTE — Telephone Encounter (Signed)
I returned call from patient's wife to call me back from my previous message.  She informed me that patient had been complaining of some new headaches for the last couple of weeks and on a couple of occasions recently she noticed that he was late staring and not there but however when she called out his name he responded right away.  Denied seeing any twitching some is lips or hands or any involuntary seizure-like movements.  I explained the MRI scan results showing multiple embolic strokes.  Patient does not appear to have any new physical deficits and appears to be safe and seems an extensive prior work-up for cryptogenic stroke and hungrily further stroke work-up is necessary at this time.  Recommend he continue [place] trial medication and we will notify the study team to see if abnormal MRI meets the study endpoint. We will plan to check EEG for seizure activity.  We will also reach out to the electrophysiology team and have his loop recorder interrogated to look for any new interval atrial fibrillation however the last check on 07/16/2020 had not shown any A. Fib  Delia Heady, MD

## 2020-07-30 NOTE — Telephone Encounter (Signed)
I received a phone call from [facility] imaging we need the results of MRI scan of the brain which patient had on 07/29/2020 showing small acute and subacute multiple infarcts involving right thalamocapsular junction, left frontal, and bilateral parietal and left temporal regions.  I call patient listed number and left a message on his mobile to call me back to discuss these results.  I also sent a secure message to EP team to interrogate his loop recorder to see if he had any A. fib found since last interrogation on 07/17/2019

## 2020-07-30 NOTE — Telephone Encounter (Signed)
Transmission received. The pt would like a phone call back with the results as well. I told him I will let the nurse know.

## 2020-07-30 NOTE — Telephone Encounter (Signed)
[facility] Lafonda Mosses) called, to report MRI.

## 2020-07-31 ENCOUNTER — Telehealth: Payer: Self-pay | Admitting: *Deleted

## 2020-07-31 DIAGNOSIS — I10 Essential (primary) hypertension: Secondary | ICD-10-CM

## 2020-07-31 DIAGNOSIS — I6523 Occlusion and stenosis of bilateral carotid arteries: Secondary | ICD-10-CM

## 2020-07-31 DIAGNOSIS — I639 Cerebral infarction, unspecified: Secondary | ICD-10-CM

## 2020-07-31 DIAGNOSIS — G2581 Restless legs syndrome: Secondary | ICD-10-CM

## 2020-07-31 DIAGNOSIS — F330 Major depressive disorder, recurrent, mild: Secondary | ICD-10-CM

## 2020-07-31 DIAGNOSIS — E1142 Type 2 diabetes mellitus with diabetic polyneuropathy: Secondary | ICD-10-CM

## 2020-07-31 DIAGNOSIS — E785 Hyperlipidemia, unspecified: Secondary | ICD-10-CM

## 2020-07-31 DIAGNOSIS — F410 Panic disorder [episodic paroxysmal anxiety] without agoraphobia: Secondary | ICD-10-CM

## 2020-07-31 DIAGNOSIS — F411 Generalized anxiety disorder: Secondary | ICD-10-CM

## 2020-07-31 DIAGNOSIS — E1159 Type 2 diabetes mellitus with other circulatory complications: Secondary | ICD-10-CM

## 2020-07-31 NOTE — Progress Notes (Signed)
I spoke to the patient and his wife and communicated results of MRI on 07/30/2020.

## 2020-07-31 NOTE — Telephone Encounter (Signed)
[name](wife) called reporting she is an [employer] fundraiser and feels that her husband's  Linq should be programmed to detect not only AF but brady and pause episodes due to the fact that the patient has been dx with 3 new strokes this week . A remote transmission was sent and reviewed that showed no events recorded since implant 03/06/2020.Education done on protocol for cryptogenic stroke patients, programming of device to detect AF, remote transmissions, and prn follow-up until episodes or events are seen. She reports that her husband has had a syncopal episode in the past and episodes of hypotension  and feels that he should have all detections enabled. She reports that she contacted [employer] tech support and they are sending her husband a symptom activator. At the end of the 26 minute conversation with [name] she reports that she felt reassured that the device clinic is monitoring her husband but that she would like for brady and pause detection to be enabled. She was told the concerns would be forwarded to Dr Johney Frame .

## 2020-07-31 NOTE — Telephone Encounter (Signed)
Doctor Pearlean Brownie is an exceptional neurologist with concentration on stroke.  I would entirely defer to his judgement.  I will call them over the weekend.

## 2020-07-31 NOTE — Telephone Encounter (Signed)
Patient's wife called stating that she would like for Dr. Patsy Lager to review the recent MRI that her husband had. Patient's wife stated that they have been told now that her husband may be having seizures and he has never had this problem before. [name] [name] stated that her husband is scheduled for an EEG ordered by his neurologist. [name] [name] stated that she is a Engineer, civil (consulting) and has done some research. Patient's wife stated that some of the diabetic medications he is on can cause seizures. [name] [name] wants to know if [facility] has a service where a pharmacist can review all of the patient's medication and determine if certain mediations can cause side effects such as seizures. Patient's wife stated that her husband is on so many mediations she is wondering if this can be causing some of his symptoms. Patient's wife is aware that Dr. Patsy Lager is out of the office today but message will be sent to him.

## 2020-07-31 NOTE — Telephone Encounter (Signed)
The pt wife [name] dpr calling with a lot of questions. She is concerned because the tachy and brady settings are not turned on. The pt neurologist found that the patient has had 5 more strokes. I let her speak with Cindy rn. She wanted to speak with Amber. I explained to her that Amber is not on the schedule today. I told her I spoke with Renee yesterday and asked her to call to speak with her. I told her I will send Renee another phone not letting her know she would like a phone call.

## 2020-08-01 NOTE — Telephone Encounter (Signed)
LMOM with no anwer.

## 2020-08-03 DIAGNOSIS — L97929 Non-pressure chronic ulcer of unspecified part of left lower leg with unspecified severity: Secondary | ICD-10-CM

## 2020-08-03 DIAGNOSIS — E11622 Type 2 diabetes mellitus with other skin ulcer: Secondary | ICD-10-CM

## 2020-08-03 NOTE — Telephone Encounter (Signed)
I spoke to [place], and I tried to answer her questions.  We reviewed the MRI report together.  I am also puzzled by the multiple new areas of stroke.  I am going to make a CCM / Pharm consultation for polypharmacy and medical management.

## 2020-08-03 NOTE — Telephone Encounter (Signed)
Called again, no answer

## 2020-08-04 NOTE — Telephone Encounter (Signed)
Opened in error

## 2020-08-06 NOTE — Telephone Encounter (Signed)
Ok to turn bradycardiac detection on at 30 bpm x 12 beats, and pause detection on at 4.5 seconds.

## 2020-08-07 ENCOUNTER — Other Ambulatory Visit: Payer: Self-pay | Admitting: Family Medicine

## 2020-08-07 ENCOUNTER — Other Ambulatory Visit: Payer: Self-pay | Admitting: Endocrinology

## 2020-08-07 DIAGNOSIS — E11622 Type 2 diabetes mellitus with other skin ulcer: Secondary | ICD-10-CM

## 2020-08-07 DIAGNOSIS — L97929 Non-pressure chronic ulcer of unspecified part of left lower leg with unspecified severity: Secondary | ICD-10-CM

## 2020-08-07 MED ORDER — LANCETS MICRO THIN 33G MISC: 5 refills | Status: DC

## 2020-08-07 NOTE — Telephone Encounter (Signed)
Patient will come in to have pause detection programmed on  For episodes 4.5 seconds, and brady detection @ 30 bpm x 12 beats on 08/11/20 @ 1630.

## 2020-08-11 ENCOUNTER — Other Ambulatory Visit: Payer: Self-pay

## 2020-08-11 ENCOUNTER — Encounter: Payer: Self-pay | Admitting: Endocrinology

## 2020-08-11 ENCOUNTER — Ambulatory Visit (INDEPENDENT_AMBULATORY_CARE_PROVIDER_SITE_OTHER): Payer: Managed Care, Other (non HMO) | Admitting: Emergency Medicine

## 2020-08-11 ENCOUNTER — Ambulatory Visit (INDEPENDENT_AMBULATORY_CARE_PROVIDER_SITE_OTHER): Payer: Managed Care, Other (non HMO) | Admitting: Endocrinology

## 2020-08-11 VITALS — BP 140/84 | HR 79 | Ht 70.0 in | Wt 286.4 lb

## 2020-08-11 DIAGNOSIS — E11622 Type 2 diabetes mellitus with other skin ulcer: Secondary | ICD-10-CM | POA: Diagnosis not present

## 2020-08-11 DIAGNOSIS — E1159 Type 2 diabetes mellitus with other circulatory complications: Secondary | ICD-10-CM

## 2020-08-11 DIAGNOSIS — L97929 Non-pressure chronic ulcer of unspecified part of left lower leg with unspecified severity: Secondary | ICD-10-CM

## 2020-08-11 DIAGNOSIS — I639 Cerebral infarction, unspecified: Secondary | ICD-10-CM

## 2020-08-11 LAB — CUP PACEART INCLINIC DEVICE CHECK
Date Time Interrogation Session: 20220315170717
Implantable Pulse Generator Implant Date: 20211008

## 2020-08-11 LAB — POCT GLYCOSYLATED HEMOGLOBIN (HGB A1C): Hemoglobin A1C: 5.7 % — AB (ref 4.0–5.6)

## 2020-08-11 MED ORDER — PANTOPRAZOLE SODIUM 40 MG PO TBEC: 40.0000 mg | DELAYED_RELEASE_TABLET | Freq: Every day | ORAL | 3 refills | Status: DC

## 2020-08-11 MED ORDER — GLIPIZIDE 5 MG PO TABS: 5.0000 mg | ORAL_TABLET | Freq: Every day | ORAL | 3 refills | Status: DC

## 2020-08-11 MED ORDER — LISINOPRIL 20 MG PO TABS: 20.0000 mg | ORAL_TABLET | Freq: Every day | ORAL | 3 refills | Status: DC

## 2020-08-11 MED ORDER — ROPINIROLE HCL 0.5 MG PO TABS: 0.5000 mg | ORAL_TABLET | Freq: Two times a day (BID) | ORAL | 3 refills | Status: DC

## 2020-08-11 NOTE — Telephone Encounter (Signed)
  LAST APPOINTMENT DATE: 08/07/2020   NEXT APPOINTMENT DATE:@3 /28/2022  MEDICATION: Pantoprazole 40mg  QD , Ropinirole 0.5 BID and Lisinopril 20mg  QD    PHARMACY:[facility]   Let patient know to contact pharmacy at the end of the day to make sure medication is ready.  Please notify patient to allow 48-72 hours to process  Encourage patient to contact the pharmacy for refills or they can request refills through [facility]  CLINICAL FILLS OUT ALL BELOW:   LAST REFILL:  QTY:  REFILL DATE:    OTHER COMMENTS:    Okay for refill?  Please advise

## 2020-08-11 NOTE — Progress Notes (Signed)
Patient seen in device clinic for programming changes. Pause detection programmed on for episodes 4.5 seconds, and brady detection @ 30 bpm x 12 beats.

## 2020-08-11 NOTE — Patient Instructions (Addendum)
I have sent a prescription to your pharmacy, to reduce the glipizide to 5 mg each morning.  Please continue the same other diabetes medications.  check your blood sugar once a day.  vary the time of day when you check, between before the 3 meals, and at bedtime.  also check if you have symptoms of your blood sugar being too high or too low.  please keep a record of the readings and bring it to your next appointment here (or you can bring the meter itself).  You can write it on any piece of paper.  please call us sooner if your blood sugar goes below 70, or if you have a lot of readings over 200.  Please come back for a follow-up appointment in 3 months.

## 2020-08-11 NOTE — Progress Notes (Signed)
Subjective:    Patient ID: [name] [name], male    DOB: 1960-07-29, 60 y.o.   MRN: [id number]  HPI Pt returns for f/u of diabetes mellitus:  DM type: 2 Dx'ed: 2010 Complications: PN, CVA's, and foot ulcers.   Therapy: trulicity and 5 oral meds. DKA: never Severe hypoglycemia: once, in 2016 Pancreatitis: never Pancreatic imaging: never SDOH: wife provides most of hx, due to pt's difficulty with concentration.   Other: he has never been on insulin; wife is nurse, and pt has also administered insulin to his father; edema limits rx options.   Interval history: He brings a record of his cbg's which I have reviewed today.  cbg varies from 66-126.  Pt is here with wife today.   Past Medical History:  Diagnosis Date  . Angio-edema   . Anxiety   . Depression   . Diabetes mellitus type II    type 2  . GERD (gastroesophageal reflux disease)   . Headache    migraine  . History of kidney stones   . HLD (hyperlipidemia)   . HTN (hypertension)   . Morbid obesity (HCC)   . Restless legs syndrome (RLS) 11/20/2012  . Stroke (HCC) 02/14/2020   ischemic/right sided affected  . Tobacco abuse   . Ulcer of left ankle (HCC)    last 2 months dime size dry dressing changing q day  . Urticaria     Past Surgical History:  Procedure Laterality Date  . Arm surgery Right 1969   Abscess excision and debridement at elbow  . COLONOSCOPY WITH PROPOFOL N/A 09/06/2016   Procedure: COLONOSCOPY WITH PROPOFOL;  Surgeon: Sherrilyn Rist, MD;  Location: [facility];  Service: Gastroenterology;  Laterality: N/A;  . I & D EXTREMITY Left 05/29/2014   Procedure: IRRIGATION AND DEBRIDEMENT OF LEFT LEG WOUND AND PLACEMENT OF  INTEGRA  AND  VAC;  Surgeon: Wayland Denis, DO;  Location: [facility];  Service: Plastics;  Laterality: Left;  . implantable loop recorder implant  03/06/2020   Medtronic Reveal [place] model LNQ11 (SN  [id number]) implanted by Dr Johney Frame for cryptogenic stroke  . INCISION AND  DRAINAGE OF WOUND Left 04/23/2014   Procedure: IRRIGATION AND DEBRIDEMENT LOWER LEFT LEG WOUND WITH PLACEMENT OF INTEGRA AND VAC;  Surgeon: Wayland Denis, DO;  Location: [facility];  Service: Plastics;  Laterality: Left;  . kidney stone removal  2004  . MINOR APPLICATION OF WOUND VAC Left 04/23/2014   Procedure: MINOR APPLICATION OF WOUND VAC;  Surgeon: Wayland Denis, DO;  Location: [facility];  Service: Plastics;  Laterality: Left;  . SKIN SPLIT GRAFT Left 06/18/2014   Procedure: SKIN GRAFT SPLIT THICKNESS TO LOWER LEFT LEG WOUND WITH PLACEMENT OF VAC;  Surgeon: Wayland Denis, DO;  Location: [facility];  Service: Plastics;  Laterality: Left;  Marland Kitchen VASECTOMY  1994    Social History   Socioeconomic History  . Marital status: Married    Spouse name: Banker  . Number of children: 3  . Years of education: Not on file  . Highest education level: Not on file  Occupational History  . 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 Smoking status: Current Some Day Smoker    Packs/day: 0.25    Years: 40.00    Pack years: 10.00    Types: Cigarettes  . Smokeless tobacco: Never Used  . Tobacco comment: one cigarette daily  Substance and Sexual Activity  . Alcohol use: Yes    Alcohol/week: 0.0 standard drinks    Comment: rarely  . Drug use: No  . Sexual activity: Yes    Partners: Female  Other Topics Concern  . Not on file  Social History Narrative   Lives with wife   Right Handed   Drink 6-7 cups caffeine daily   Social Determinants of Health   Financial Resource Strain: Not on file  Food Insecurity: Not on file  Transportation Needs: Not on file  Physical Activity: Not on file  Stress: Not on file  Social Connections: Not on file  Intimate Partner Violence: Not on file    Current Outpatient Medications on File Prior to Visit  Medication Sig Dispense Refill  .  acetaminophen (TYLENOL) 500 MG tablet Take 1,000 mg by mouth 2 (two) times daily as needed (for pain).    Marland Kitchen aspirin EC 81 MG EC tablet Take 1 tablet (81 mg total) by mouth daily. Swallow whole. 30 tablet 3  . bromocriptine (PARLODEL) 2.5 MG tablet Take 0.5 tablets (1.25 mg total) by mouth at bedtime. 45 tablet 3  . colesevelam (WELCHOL) 625 MG tablet Take 2 tablets (1,250 mg total) by mouth daily. 60 tablet 11  . dapagliflozin propanediol (FARXIGA) 10 MG TABS tablet TAKE 1 TABLET BY MOUTH DAILY BEFORE BREAKFAST 90 tablet 1  . EPINEPHrine 0.3 mg/0.3 mL IJ SOAJ injection ADMINISTER 0.3 ML IN THE MUSCLE 1 TIME FOR 1 DOSE 2 each 0  . glucose blood (CONTOUR NEXT TEST) test strip USE TO CHECK BLOOD SUGAR 100 strip 5  . Lancets Micro Thin 33G MISC Use to check blood sugar two times daily. 100 each 5  . metFORMIN (GLUCOPHAGE-XR) 500 MG 24 hr tablet Take 4 tablets by mouth daily with breakfast 360 tablet 1  . Multiple Vitamin (MULTIVITAMIN) tablet Take 1 tablet by mouth daily.    Marland Kitchen PARoxetine (PAXIL) 20 MG tablet Take 3 tablets (60 mg total) by mouth daily. 270 tablet 1  . PARoxetine (PAXIL) 40 MG tablet TAKE 1 TABLET(40 MG) BY MOUTH EVERY MORNING 90 tablet 1  . pentoxifylline (TRENTAL) 400 MG CR tablet TAKE 1 TABLET BY MOUTH 3  TIMES DAILY WITH MEALS 270 tablet 3  . pregabalin (LYRICA) 75 MG capsule Take 1 capsule (75 mg total) by mouth 3 (three) times daily. 270 capsule 1  . rosuvastatin (CRESTOR) 20 MG tablet TAKE 1 TABLET(20 MG) BY MOUTH AT BEDTIME 90 tablet 3  . sildenafil (VIAGRA) 100 MG tablet Take 1 tablet (100 mg total) by mouth daily as needed for erectile dysfunction. 10 tablet 5  . TRULICITY 4.5 MG/0.5ML SOPN INJECT 4.5 MG INTO THE SKIN ONCE A WEEK 6 mL 3   No current facility-administered medications on file prior to visit.    Allergies  Allergen Reactions  . Cashew Nut Oil Anaphylaxis    Cashews, 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  Marland Kitchen Pistachio Nut (Diagnostic) Hives, Shortness Of Breath, Itching,  Swelling and Rash  . Latex Dermatitis  . Tape Other (See Comments)    Tears and bruises the skin  . Betadine [Povidone Iodine] Rash and Other (See Comments)    Raised rash 04/23/14  . Lidocaine Other (See Comments)    Minimal effect with lidocaine, prefers different anesthetic  . Penicillins Rash and Other (See Comments)    Rash all over as a child    Family History  Problem Relation Age of Onset  . Stroke Mother  multiple  . Leukemia Mother   . Pneumonia Mother   . Cancer Mother   . Diabetes Mother   . Diabetes Father   . Heart disease Father   . Hyperlipidemia Father   . Hypertension Father   . Allergic rhinitis Neg Hx   . Angioedema Neg Hx   . Asthma Neg Hx   . Eczema Neg Hx   . Immunodeficiency Neg Hx   . Urticaria Neg Hx     BP 140/84 (BP Location: Right Arm, Patient Position: Sitting, Cuff Size: Large)   Pulse 79   Ht 5\' 10"  (1.778 m)   Wt 286 lb 6.4 oz (129.9 kg)   SpO2 95%   BMI 41.09 kg/m    Review of Systems     Objective:   Physical Exam VITAL SIGNS:  See vs page GENERAL: no distress Pulses: dorsalis pedis intact bilat.   MSK: no deformity of the feet CV: 2+ bilat leg edema, and bilat vv's.  Skin: healed ulcers on the feet and legs.  normal color and temp on the feet, but there is (chronic) erythema of the feet.  Neuro: sensation is intact to touch on the feet, but severely decreased from normal.     Lab Results  Component Value Date   HGBA1C 5.7 (A) 08/11/2020   Lab Results  Component Value Date   CREATININE 1.03 06/17/2020   BUN 17 06/17/2020   NA 141 06/17/2020   K 4.1 06/17/2020   CL 104 06/17/2020   CO2 24 06/17/2020       Assessment & Plan:  Type 2 DM, with CVA's: overcontrolled.   Patient Instructions  I have sent a prescription to your pharmacy, to reduce the glipizide to 5 mg each morning.  Please continue the same other diabetes medications.  check your blood sugar once a day.  vary the time of day when you check,  between before the 3 meals, and at bedtime.  also check if you have symptoms of your blood sugar being too high or too low.  please keep a record of the readings and bring it to your next appointment here (or you can bring the meter itself).  You can write it on any piece of paper.  please call 06/19/2020 sooner if your blood sugar goes below 70, or if you have a lot of readings over 200.  Please come back for a follow-up appointment in 3 months.

## 2020-08-11 NOTE — Telephone Encounter (Signed)
They are going out of town tomorrow

## 2020-08-14 ENCOUNTER — Ambulatory Visit: Payer: Managed Care, Other (non HMO) | Admitting: Endocrinology

## 2020-08-17 ENCOUNTER — Ambulatory Visit: Payer: Self-pay

## 2020-08-17 ENCOUNTER — Ambulatory Visit (INDEPENDENT_AMBULATORY_CARE_PROVIDER_SITE_OTHER): Payer: Managed Care, Other (non HMO)

## 2020-08-17 DIAGNOSIS — I639 Cerebral infarction, unspecified: Secondary | ICD-10-CM

## 2020-08-17 NOTE — Progress Notes (Signed)
Chronic Care Management Pharmacy Note  08/17/2020 Name:  [name] [name] MRN:  [id number] DOB:  [birth year]  Subjective: [name] [name] is an 60 y.o. year old male who is a primary patient of Copland, Frederico Hamman, MD.  The CCM team was consulted for assistance with disease management and care coordination needs.    Patient Care Team: Owens Loffler, MD as PCP - Lacie Scotts, MD as Consulting Physician (Dermatology) Johnathan Hausen, MD as Consulting Physician (General Surgery)  Objective:  Lab Results  Component Value Date   CREATININE 1.03 06/17/2020   BUN 17 06/17/2020   GFRNONAA 79 06/17/2020   GFRAA 91 06/17/2020   NA 141 06/17/2020   K 4.1 06/17/2020   CALCIUM 9.9 06/17/2020   CO2 24 06/17/2020   GLUCOSE 126 (H) 06/17/2020    Lab Results  Component Value Date/Time   HGBA1C 5.7 (A) 08/11/2020 10:45 AM   HGBA1C 5.8 (H) 06/17/2020 10:30 AM   HGBA1C 6.7 (A) 04/10/2020 01:47 PM   HGBA1C 8.4 (H) 02/15/2020 01:42 AM   MICROALBUR 2.82 (H) 07/27/2007 10:51 PM   MICROALBUR 5.05 (H) 10/13/2006 10:12 PM     Lab Results  Component Value Date   CHOL 96 (L) 06/17/2020   HDL 30 (L) 06/17/2020   LDLCALC 36 06/17/2020   LDLDIRECT 76 02/19/2014   TRIG 186 (H) 06/17/2020   CHOLHDL 3.2 06/17/2020    Hepatic Function Latest Ref Rng & Units 06/17/2020 02/14/2020 08/29/2019  Total Protein 6.0 - 8.5 g/dL 6.5 7.0 6.8  Albumin 3.8 - 4.9 g/dL 4.2 4.1 4.6  AST 0 - 40 IU/L $Remov'13 17 14  'mEYaRE$ ALT 0 - 44 IU/L $Remov'15 20 19  'IevEBG$ Alk Phosphatase 44 - 121 IU/L 57 45 52  Total Bilirubin 0.0 - 1.2 mg/dL 0.5 0.8 0.5  Bilirubin, Direct 0.00 - 0.40 mg/dL 0.16 - 0.18    Lab Results  Component Value Date/Time   TSH 1.540 08/29/2019 08:57 AM   TSH 0.984 04/12/2019 04:10 PM    CBC Latest Ref Rng & Units 06/17/2020 02/15/2020 02/14/2020  WBC 3.4 - 10.8 x10E3/uL 10.0 10.7(H) -  Hemoglobin 13.0 - 17.7 g/dL 16.4 16.1 17.3(H)  Hematocrit 37.5 - 51.0 % 48.6 48.2 51.0  Platelets 150 - 450 x10E3/uL 225 212  -    No results found for: VD25OH  Clinical ASCVD: Yes  The ASCVD Risk score Mikey Bussing DC Jr., et al., 2013) failed to calculate for the following reasons:   The patient has a prior MI or stroke diagnosis    Depression screen [facility] 2/9 06/22/2020 06/28/2018 06/26/2017  Decreased Interest 0 0 0  Down, Depressed, Hopeless 1 0 0  PHQ - 2 Score 1 0 0  Altered sleeping 0 - -  Tired, decreased energy 0 - -  Change in appetite 0 - -  Feeling bad or failure about yourself  1 - -  Trouble concentrating 1 - -  Moving slowly or fidgety/restless 1 - -  Suicidal thoughts 0 - -  PHQ-9 Score 4 - -  Difficult doing work/chores Somewhat difficult - -  Some recent data might be hidden    Social History   Tobacco Use  Smoking Status Current Some Day Smoker   Packs/day: 0.25   Years: 40.00   Pack years: 10.00   Types: Cigarettes  Smokeless Tobacco Never Used  Tobacco Comment   one cigarette daily   BP Readings from Last 3 Encounters:  08/11/20 140/84  07/07/20 140/85  06/22/20 120/72   Pulse Readings  from Last 3 Encounters:  08/11/20 79  07/07/20 80  06/22/20 95   Wt Readings from Last 3 Encounters:  08/11/20 286 lb 6.4 oz (129.9 kg)  07/07/20 284 lb (128.8 kg)  06/22/20 290 lb (131.5 kg)   BMI Readings from Last 3 Encounters:  08/11/20 41.09 kg/m  07/07/20 40.75 kg/m  06/22/20 42.21 kg/m    Assessment/Interventions: Review of patient past medical history, allergies, medications, health status, including review of consultants reports, laboratory and other test data, was performed as part of comprehensive evaluation and provision of chronic care management services.     CCM Care Plan  Allergies  Allergen Reactions   Cashew Nut Oil Anaphylaxis    Cashews, 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   Pistachio Nut (Diagnostic) Hives, Shortness Of Breath, Itching, Swelling and Rash   Latex Dermatitis   Tape Other (See Comments)    Tears and bruises the skin   Betadine [Povidone Iodine]  Rash and Other (See Comments)    Raised rash 04/23/14   Lidocaine Other (See Comments)    Minimal effect with lidocaine, prefers different anesthetic   Penicillins Rash and Other (See Comments)    Rash all over as a child    Medications Reviewed Today    Reviewed by Shirlee More, RN (Registered Nurse) on 08/11/20 at [place] List Status: <None>  Medication Order Taking? Sig Documenting Provider Last Dose Status Informant  acetaminophen (TYLENOL) 500 MG tablet [id number] Yes Take 1,000 mg by mouth 2 (two) times daily as needed (for pain). [provider] Taking Active Multiple Informants  aspirin EC 81 MG EC tablet [id number] Yes Take 1 tablet (81 mg total) by mouth daily. Swallow whole. Bonnielee Haff, MD Taking Active   bromocriptine (PARLODEL) 2.5 MG tablet [id number] Yes Take 0.5 tablets (1.25 mg total) by mouth at bedtime. Renato Shin, MD Taking Active   colesevelam [facility]) 625 MG tablet [id number] Yes Take 2 tablets (1,250 mg total) by mouth daily. Renato Shin, MD Taking Active Multiple Informants  dapagliflozin propanediol (FARXIGA) 10 MG TABS tablet [id number] Yes TAKE 1 TABLET BY MOUTH DAILY BEFORE Paticia Stack, MD Taking Active   EPINEPHrine 0.3 mg/0.3 mL IJ SOAJ injection [id number] Yes ADMINISTER 0.3 ML IN THE MUSCLE 1 TIME FOR 1 DOSE Copland, Spencer, MD Taking Active   glipiZIDE (GLUCOTROL) 5 MG tablet [id number] Yes Take 1 tablet (5 mg total) by mouth daily before breakfast. Renato Shin, MD Taking Active   glucose blood (CONTOUR NEXT TEST) test strip [id number] Yes USE TO CHECK BLOOD SUGAR Copland, Spencer, MD Taking Active   Lancets Micro Thin 33G MISC [id number] Yes Use to check blood sugar two times daily. Copland, Frederico Hamman, MD Taking Active   lisinopril (ZESTRIL) 20 MG tablet [id number] Yes Take 1 tablet (20 mg total) by mouth daily. Copland, Frederico Hamman, MD Taking Active   metFORMIN (GLUCOPHAGE-XR) 500 MG 24 hr tablet [id number] Yes Take 4 tablets by mouth  daily with breakfast Renato Shin, MD Taking Active   Multiple Vitamin (MULTIVITAMIN) tablet [id number] Yes Take 1 tablet by mouth daily. [provider] Taking Active Multiple Informants           Med Note (MORA Tsosie Billing A   Fri Feb 14, 2020  1:18 PM) .  pantoprazole (PROTONIX) 40 MG tablet [id number] Yes Take 1 tablet (40 mg total) by mouth daily. Copland, Frederico Hamman, MD Taking Active   PARoxetine (PAXIL) 20 MG tablet [id number] Yes Take 3 tablets (60 mg total) by mouth daily. Owens Loffler, MD Taking  Active   PARoxetine (PAXIL) 40 MG tablet [id number] Yes TAKE 1 TABLET(40 MG) BY MOUTH EVERY MORNING Copland, Spencer, MD Taking Active   pentoxifylline (TRENTAL) 400 MG CR tablet [id number] Yes TAKE 1 TABLET BY MOUTH 3  TIMES DAILY WITH MEALS Copland, Spencer, MD Taking Active   pregabalin (LYRICA) 75 MG capsule [id number] Yes Take 1 capsule (75 mg total) by mouth 3 (three) times daily. Copland, Frederico Hamman, MD Taking Active   rOPINIRole (REQUIP) 0.5 MG tablet [id number] Yes Take 1 tablet (0.5 mg total) by mouth 2 (two) times daily. Copland, Frederico Hamman, MD Taking Active   rosuvastatin (CRESTOR) 20 MG tablet [id number] Yes TAKE 1 TABLET(20 MG) BY MOUTH AT BEDTIME Copland, Spencer, MD Taking Active   sildenafil (VIAGRA) 100 MG tablet [id number] Yes Take 1 tablet (100 mg total) by mouth daily as needed for erectile dysfunction. Owens Loffler, MD Taking Active Multiple Informants  TRULICITY 4.5 WL/7.9GX SOPN [id number] Yes INJECT 4.5 MG INTO THE SKIN ONCE A Leland Johns, MD Taking Active           Patient Active Problem List   Diagnosis Date Noted   Panic attacks 06/23/2020   Aortic calcification () 02/22/2020   Carotid artery calcification, bilateral 02/22/2020   MDD (major depressive disorder), recurrent episode ([place])    GERD (gastroesophageal reflux disease)    Ischemic stroke ([place]), occipital    Diabetic polyneuropathy associated with type 2 diabetes mellitus ([place])  06/30/2018   Erectile dysfunction due to type 2 diabetes mellitus ([place]) 06/30/2018   Circulation disorder of lower extremity 05/31/2015   IBS (irritable bowel syndrome) 12/24/2014   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 ([place]) 04/23/2014   Diabetic leg ulcer ([place]) 07/18/2013   Restless legs syndrome (RLS) 11/20/2012   Generalized anxiety disorder    Type 2 diabetes mellitus with vascular disease ([place])    Hyperlipidemia LDL goal <70 01/16/2009   TOBACCO ABUSE 07/27/2007   Morbid obesity ([place]) 09/29/2006   Essential hypertension 09/29/2006   CALCULUS, KIDNEY 05/30/2002    Immunization History  Administered Date(s) Administered   Influenza,inj,Quad PF,6+ Mos 04/20/2016, 03/23/2017, 03/19/2018, 02/06/2019, 02/20/2020   Influenza-Unspecified 03/31/2015   Janssen (J&J) SARS-COV-2 Vaccination 09/27/2019, 04/17/2020   Pneumococcal Polysaccharide-23 11/25/2010, 04/20/2016   Tdap 04/20/2016    Contacted patient and spoke with his wife [name] to review medication concerns due to recurrent stroke.  Reviewed medications and labs. Medications appropriate. No drug interactions requiring change in therapy.   Risk Factors: DM now well controlled (A1c  < 7%), LDL controlled (< 70). Recommend BP goal < 130/80 (lisinopril increased 3/11). Recommend maintaining smoking cessation.   Antiplatelet: Considering recurrent stroke on aspirin 81 mg,  reasonable to consider switch to Plavix or aspirin + extended-release dipyridamole. Alternatively, addition of Plavix to aspirin also reasonable acknowledging increased risk of bleeding and unknown impact on reducing stroke. Recommend discussion of these options with neurology.   Debbora Dus, PharmD Clinical Pharmacist [facility] at [facility] [phone]

## 2020-08-18 LAB — CUP PACEART REMOTE DEVICE CHECK
Date Time Interrogation Session: 20220322114005
Implantable Pulse Generator Implant Date: 20211008

## 2020-08-20 MED ORDER — LISINOPRIL 40 MG PO TABS: 40.0000 mg | ORAL_TABLET | Freq: Every day | ORAL | 1 refills | Status: DC

## 2020-08-20 NOTE — Addendum Note (Signed)
Addended by: Damita Lack on: 08/20/2020 04:42 PM   Modules accepted: Orders

## 2020-08-21 NOTE — Progress Notes (Signed)
I have personally reviewed this encounter including the documentation in this note and have collaborated with the care management provider regarding care management and care coordination activities to include development and update of the comprehensive care plan. I am certifying that I agree with the content of this note and encounter as supervising physician.    

## 2020-08-24 ENCOUNTER — Ambulatory Visit: Payer: Managed Care, Other (non HMO) | Admitting: Family Medicine

## 2020-08-24 ENCOUNTER — Telehealth: Payer: Self-pay | Admitting: Internal Medicine

## 2020-08-24 NOTE — Telephone Encounter (Signed)
Since pt said he is returning a call to [facility] I checked with our Pharm-D Aundra Millet to see if she called the pt. Per Aundra Millet, Pharm-D she did not call the pt. I will send call to Dr. Jenel Lucks nurse Inetta Fermo for further follow up.

## 2020-08-24 NOTE — Telephone Encounter (Signed)
Phone call from [place] in [place] was for [name] [name] not her husband. Will bring [name]'s information to [place].

## 2020-08-24 NOTE — Telephone Encounter (Signed)
Patient's wife states she is returning a call back from [place]. Please advise.

## 2020-08-24 NOTE — Telephone Encounter (Signed)
Noted, followed up with pt's wife and documented in her records.

## 2020-08-25 NOTE — Progress Notes (Signed)
[facility] Summary Report / Loop Recorder 

## 2020-08-26 ENCOUNTER — Other Ambulatory Visit (INDEPENDENT_AMBULATORY_CARE_PROVIDER_SITE_OTHER): Payer: Self-pay

## 2020-08-26 ENCOUNTER — Other Ambulatory Visit: Payer: Self-pay

## 2020-08-26 DIAGNOSIS — R569 Unspecified convulsions: Secondary | ICD-10-CM

## 2020-08-31 ENCOUNTER — Other Ambulatory Visit: Payer: Self-pay | Admitting: Neurology

## 2020-08-31 DIAGNOSIS — R569 Unspecified convulsions: Secondary | ICD-10-CM

## 2020-08-31 DIAGNOSIS — I480 Paroxysmal atrial fibrillation: Secondary | ICD-10-CM | POA: Insufficient documentation

## 2020-08-31 HISTORY — DX: Paroxysmal atrial fibrillation: I48.0

## 2020-09-06 ENCOUNTER — Other Ambulatory Visit: Payer: Self-pay | Admitting: Family Medicine

## 2020-09-07 ENCOUNTER — Telehealth: Payer: Self-pay | Admitting: Neurology

## 2020-09-07 NOTE — Telephone Encounter (Signed)
Kindly inform the patient that EEG study shows mild slowing of brainwave activity which is a nonspecific condition seen in a variety of situations like memory loss, previous stroke, medication effect and several other conditions.  No seizure activity or any worrisome finding

## 2020-09-07 NOTE — Progress Notes (Signed)
Kindly inform the patient that EEG of brainwave study was slightly abnormal showing mild slowing of brainwave activity on both sides which is not an uncommon finding seen in patients with memory loss.  No definite seizure activity or worrisome finding noted

## 2020-09-07 NOTE — Telephone Encounter (Signed)
Pt's wife called wanting to know if the EEG results have come in. Please advise.

## 2020-09-08 ENCOUNTER — Encounter: Payer: Self-pay | Admitting: *Deleted

## 2020-09-08 NOTE — Telephone Encounter (Signed)
Called and spoke to patient's wife.  Discussed Dr. Marlis Edelson findings regarding EEG results.  She stated patient has had multiple strokes and was not concerned about the slowing of brainwaves.  Patient denied further questions, verbalized understanding and expressed appreciation for the phone call.

## 2020-09-14 MED ORDER — METFORMIN HCL ER 500 MG PO TB24: ORAL_TABLET | ORAL | 1 refills | Status: DC

## 2020-09-17 ENCOUNTER — Ambulatory Visit (INDEPENDENT_AMBULATORY_CARE_PROVIDER_SITE_OTHER): Payer: 59

## 2020-09-17 DIAGNOSIS — I639 Cerebral infarction, unspecified: Secondary | ICD-10-CM | POA: Diagnosis not present

## 2020-09-20 ENCOUNTER — Other Ambulatory Visit: Payer: Self-pay | Admitting: Endocrinology

## 2020-09-21 ENCOUNTER — Other Ambulatory Visit: Payer: Self-pay

## 2020-09-21 ENCOUNTER — Ambulatory Visit: Payer: Managed Care, Other (non HMO) | Admitting: Family Medicine

## 2020-09-21 ENCOUNTER — Telehealth: Payer: Self-pay | Admitting: Neurology

## 2020-09-21 ENCOUNTER — Ambulatory Visit (INDEPENDENT_AMBULATORY_CARE_PROVIDER_SITE_OTHER): Payer: 59 | Admitting: Family Medicine

## 2020-09-21 ENCOUNTER — Encounter: Payer: Self-pay | Admitting: Family Medicine

## 2020-09-21 VITALS — BP 124/80 | HR 54 | Temp 96.9°F | Ht 70.0 in | Wt 280.5 lb

## 2020-09-21 DIAGNOSIS — I639 Cerebral infarction, unspecified: Secondary | ICD-10-CM | POA: Diagnosis not present

## 2020-09-21 DIAGNOSIS — E785 Hyperlipidemia, unspecified: Secondary | ICD-10-CM | POA: Diagnosis not present

## 2020-09-21 DIAGNOSIS — F411 Generalized anxiety disorder: Secondary | ICD-10-CM

## 2020-09-21 DIAGNOSIS — I10 Essential (primary) hypertension: Secondary | ICD-10-CM

## 2020-09-21 DIAGNOSIS — F17200 Nicotine dependence, unspecified, uncomplicated: Secondary | ICD-10-CM

## 2020-09-21 DIAGNOSIS — E1159 Type 2 diabetes mellitus with other circulatory complications: Secondary | ICD-10-CM

## 2020-09-21 LAB — CUP PACEART REMOTE DEVICE CHECK
Date Time Interrogation Session: 20220424114526
Implantable Pulse Generator Implant Date: 20211008

## 2020-09-21 MED ORDER — VARENICLINE TARTRATE 1 MG PO TABS: 1.0000 mg | ORAL_TABLET | Freq: Two times a day (BID) | ORAL | 3 refills | Status: DC

## 2020-09-21 MED ORDER — CHANTIX STARTING MONTH PAK 0.5 MG X 11 & 1 MG X 42 PO TABS: ORAL_TABLET | ORAL | 0 refills | Status: DC

## 2020-09-21 NOTE — Telephone Encounter (Signed)
Pt's wife called wanting to speak to the RN regarding the pt's loop devise.Please advise.

## 2020-09-21 NOTE — Progress Notes (Signed)
[name] Rolfson T. Jolayne Branson, MD, CAQ Sports Medicine  Primary Care and Sports Medicine [facility] [address] [place], [address]  Phone: [phone]  FAX: [phone]  Daveon Arpino - 60 y.o. male  MRN [id number]  Date of Birth: 1961/04/18  Date: 09/21/2020  PCP: Hannah Beat, MD  Referral: Hannah Beat, MD  Chief Complaint  Patient presents with  . Follow-up    3 month    This visit occurred during the SARS-CoV-2 public health emergency.  Safety protocols were in place, including screening questions prior to the visit, additional usage of staff PPE, and extensive cleaning of exam room while observing appropriate contact time as indicated for disinfecting solutions.   Subjective:   Lazaro Isenhower is a 60 y.o. very pleasant male patient with Body mass index is 40.25 kg/m. who presents with the following:  F/u  HTN: Tolerating all medications without side effects Stable and at goal No CP, no sob. No HA.  BP Readings from Last 3 Encounters:  09/21/20 124/80  08/11/20 140/84  07/07/20 140/85    Basic Metabolic Panel:    Component Value Date/Time   NA 141 06/17/2020 1030   K 4.1 06/17/2020 1030   CL 104 06/17/2020 1030   CO2 24 06/17/2020 1030   BUN 17 06/17/2020 1030   CREATININE 1.03 06/17/2020 1030   GLUCOSE 126 (H) 06/17/2020 1030   GLUCOSE 168 (H) 02/15/2020 0142   CALCIUM 9.9 06/17/2020 1030    Check a1c again.  EEG - [name] not that concerned about brain wave slowing.  Wt Readings from Last 3 Encounters:  09/21/20 280 lb 8 oz (127.2 kg)  08/11/20 286 lb 6.4 oz (129.9 kg)  07/07/20 284 lb (128.8 kg)    Lipids: Doing well, stable. Tolerating meds fine with no SE. Panel reviewed with patient.  Lipids: Lab Results  Component Value Date   CHOL 96 (L) 06/17/2020   Lab Results  Component Value Date   HDL 30 (L) 06/17/2020   Lab Results  Component Value Date   LDLCALC 36 06/17/2020   Lab Results   Component Value Date   TRIG 186 (H) 06/17/2020   Lab Results  Component Value Date   CHOLHDL 3.2 06/17/2020    Lab Results  Component Value Date   ALT 15 06/17/2020   AST 13 06/17/2020   ALKPHOS 57 06/17/2020   BILITOT 0.5 06/17/2020    He does continue to smoke, but he is dramatically decreased this amount to only few cigarettes a day.  He has lost well over 100 pounds.  He has done a remarkable job with a total lifestyle change.  His anxiety and panic attacks have stabilized.  He does think that the Paxil increase made a significant impact.  He does have some occasional anxiety, but this is significantly improved.  Review of Systems is noted in the HPI, as appropriate  Objective:   BP 124/80   Pulse (!) 54   Temp (!) 96.9 F (36.1 C) (Temporal)   Ht 5\' 10"  (1.778 m)   Wt 280 lb 8 oz (127.2 kg)   SpO2 97%   BMI 40.25 kg/m   GEN: No acute distress; alert,appropriate. PULM: Breathing comfortably in no respiratory distress PSYCH: Normally interactive.  CV: RRR, no m/g/r   Laboratory and Imaging Data: Lab Review:  CBC EXTENDED Latest Ref Rng & Units 06/17/2020 02/15/2020 02/14/2020  WBC 3.4 - 10.8 x10E3/uL 10.0 10.7(H) -  RBC 4.14 - 5.80 x10E6/uL  5.53 5.69 -  HGB 13.0 - 17.7 g/dL 16.4 16.1 17.3(H)  HCT 37.5 - 51.0 % 48.6 48.2 51.0  PLT 150 - 450 x10E3/uL 225 212 -  NEUTROABS 1.4 - 7.0 x10E3/uL 6.7 7.1 -  LYMPHSABS 0.7 - 3.1 x10E3/uL 2.1 2.3 -    BMP Latest Ref Rng & Units 06/17/2020 02/15/2020 02/14/2020  Glucose 65 - 99 mg/dL 126(H) 168(H) 181(H)  BUN 6 - 24 mg/dL 17 15 22(H)  Creatinine 0.76 - 1.27 mg/dL 1.03 1.08 1.10  BUN/Creat Ratio 9 - 20 17 - -  Sodium 134 - 144 mmol/L 141 137 140  Potassium 3.5 - 5.2 mmol/L 4.1 3.9 4.0  Chloride 96 - 106 mmol/L 104 102 107  CO2 20 - 29 mmol/L 24 26 -  Calcium 8.7 - 10.2 mg/dL 9.9 9.8 -    Hepatic Function Latest Ref Rng & Units 06/17/2020 02/14/2020 08/29/2019  Total Protein 6.0 - 8.5 g/dL 6.5 7.0 6.8  Albumin 3.8 - 4.9  g/dL 4.2 4.1 4.6  AST 0 - 40 IU/L $Remov'13 17 14  'FRNMQu$ ALT 0 - 44 IU/L $Remov'15 20 19  'zIbcbI$ Alk Phosphatase 44 - 121 IU/L 57 45 52  Total Bilirubin 0.0 - 1.2 mg/dL 0.5 0.8 0.5  Bilirubin, Direct 0.00 - 0.40 mg/dL 0.16 - 0.18    Lab Results  Component Value Date   CHOL 96 (L) 06/17/2020   Lab Results  Component Value Date   HDL 30 (L) 06/17/2020   Lab Results  Component Value Date   LDLCALC 36 06/17/2020   Lab Results  Component Value Date   TRIG 186 (H) 06/17/2020   Lab Results  Component Value Date   CHOLHDL 3.2 06/17/2020   No results for input(s): PSA in the last 72 hours. No results found for: HCVAB No results found for: VD25OH   Lab Results  Component Value Date   HGBA1C 5.7 (A) 08/11/2020   HGBA1C 5.8 (H) 06/17/2020   HGBA1C 6.7 (A) 04/10/2020   Lab Results  Component Value Date   MICROALBUR 2.82 (H) 07/27/2007   LDLCALC 36 06/17/2020   CREATININE 1.03 06/17/2020    Assessment and Plan:     ICD-10-CM   1. Type 2 diabetes mellitus with vascular disease (HCC)  E11.59   2. Morbid obesity ([place])  E66.01   3. Ischemic stroke ([place]), occipital  I63.9   4. Hyperlipidemia LDL goal <70  E78.5   5. Essential hypertension  I10   6. TOBACCO ABUSE  F17.200   7. Generalized anxiety disorder  F41.1    I applauded [name] for his a remarkable weight loss and dramatic lifestyle changes.  His blood pressure, diabetes, and lipids have all stabilized.  He does continue to have sequelae of his stroke, and he also was found to have multiple other strokes on his most recent MRI.  He does see Dr. Leonie Man.  He and his wife had some questions about some antiplatelet medication.  His wife [name] is an Therapist, sports, and we had a discussion about antiplatelet therapy with stroke.  He is then of stroke study.  [name] and [name] are going to talk to him about this at his next office visit.  At this point, I think that his primary modifiable risk factor would be smoking.  He is pretty reasonably motivated to quit.  I Georgina Peer  give him some Chantix to assist.  Meds ordered this encounter  Medications  . varenicline (CHANTIX STARTING MONTH PAK) 0.5 MG X 11 & 1 MG X  42 tablet    Sig: Take one 0.$RemoveBe'5mg'tETvTaQie$  tablet by mouth once daily for 3 days, then increase to one 0.$Remove'5mg'djqhDkD$  tablet twice daily for 3 days, then increase to one $Remo'1mg'zwomV$  tablet twice daily.    Dispense:  53 tablet    Refill:  0  . varenicline (CHANTIX) 1 MG tablet    Sig: Take 1 tablet (1 mg total) by mouth 2 (two) times daily.    Dispense:  60 tablet    Refill:  3   Medications Discontinued During This Encounter  Medication Reason  . PARoxetine (PAXIL) 40 MG tablet Change in therapy   No orders of the defined types were placed in this encounter.   Follow-up: Return in about 6 months (around 03/23/2021).  Signed,  Maud Deed. Tyjah Hai, MD   Outpatient Encounter Medications as of 09/21/2020  Medication Sig  . acetaminophen (TYLENOL) 500 MG tablet Take 1,000 mg by mouth 2 (two) times daily as needed (for pain).  Marland Kitchen aspirin EC 81 MG EC tablet Take 1 tablet (81 mg total) by mouth daily. Swallow whole.  . bromocriptine (PARLODEL) 2.5 MG tablet Take 0.5 tablets (1.25 mg total) by mouth at bedtime.  . dapagliflozin propanediol (FARXIGA) 10 MG TABS tablet TAKE 1 TABLET BY MOUTH DAILY BEFORE BREAKFAST  . EPINEPHrine 0.3 mg/0.3 mL IJ SOAJ injection ADMINISTER 0.3 ML IN THE MUSCLE 1 TIME FOR 1 DOSE  . glipiZIDE (GLUCOTROL) 5 MG tablet Take 1 tablet (5 mg total) by mouth daily before breakfast.  . glucose blood (CONTOUR NEXT TEST) test strip USE TO CHECK BLOOD SUGAR  . Lancets Micro Thin 33G MISC Use to check blood sugar two times daily.  Marland Kitchen lisinopril (ZESTRIL) 40 MG tablet Take 1 tablet (40 mg total) by mouth daily.  . metFORMIN (GLUCOPHAGE-XR) 500 MG 24 hr tablet Take 4 tablets by mouth daily with breakfast  . Multiple Vitamin (MULTIVITAMIN) tablet Take 1 tablet by mouth daily.  . pantoprazole (PROTONIX) 40 MG tablet Take 1 tablet (40 mg total) by mouth daily.  Marland Kitchen  PARoxetine (PAXIL) 20 MG tablet Take 3 tablets (60 mg total) by mouth daily.  . pentoxifylline (TRENTAL) 400 MG CR tablet TAKE 1 TABLET BY MOUTH 3  TIMES DAILY WITH MEALS  . pregabalin (LYRICA) 75 MG capsule Take 1 capsule (75 mg total) by mouth 3 (three) times daily.  Marland Kitchen rOPINIRole (REQUIP) 0.5 MG tablet Take 1 tablet (0.5 mg total) by mouth 2 (two) times daily.  . rosuvastatin (CRESTOR) 20 MG tablet TAKE 1 TABLET(20 MG) BY MOUTH AT BEDTIME  . sildenafil (VIAGRA) 100 MG tablet Take 1 tablet (100 mg total) by mouth daily as needed for erectile dysfunction.  . TRULICITY 4.5 DV/7.6HY SOPN INJECT 4.5 MG INTO THE SKIN ONCE A WEEK  . varenicline (CHANTIX STARTING MONTH PAK) 0.5 MG X 11 & 1 MG X 42 tablet Take one 0.$RemoveBe'5mg'nRklCQdwY$  tablet by mouth once daily for 3 days, then increase to one 0.$Remove'5mg'gtjvDYp$  tablet twice daily for 3 days, then increase to one $Remo'1mg'cDRwh$  tablet twice daily.  . varenicline (CHANTIX) 1 MG tablet Take 1 tablet (1 mg total) by mouth 2 (two) times daily.  . [DISCONTINUED] colesevelam (WELCHOL) 625 MG tablet Take 2 tablets (1,250 mg total) by mouth daily.  . [DISCONTINUED] PARoxetine (PAXIL) 40 MG tablet TAKE 1 TABLET(40 MG) BY MOUTH EVERY MORNING   No facility-administered encounter medications on file as of 09/21/2020.

## 2020-09-22 ENCOUNTER — Telehealth: Payer: Self-pay

## 2020-09-22 NOTE — Telephone Encounter (Signed)
Patient's wife has been reading the reports coming from Dr. Johney Frame.  She said he is having periods of bradycardia and had a 4 minute run of A-fib.  She is concerned when he had the run of A-fib, she knows he was helping her plant flowers (physically exerting) and she is concerned that if that is all it takes for him to go into A-fib, she is concerned the study is getting in the way of actual treatment.    She said her husband (the patient) is scared to death he is going to have a real big stroke while on this study and it could have been prevented had he been on a real treatment and unknown placebo/medication?    Her appointment she has with you is 7/27.    She is aware that Dr. Pearlean Brownie is in the hospital this week and may be delayed in answering message.

## 2020-09-22 NOTE — Telephone Encounter (Signed)
Kindly inform the patient that I spoke to the cardiologist and they just received the tracing and they are reviewing it and they are likely going to call him to see them to change him to Eliquis or alternative strong blood thinner if A. fib is confirmed.  He will have to stop the study medication if that is done

## 2020-09-22 NOTE — Telephone Encounter (Signed)
Manual transmission requested by Luster Landsberg, PA-C to review AF events on summary report. Manual transmission received 09/22/20.  ILR implanted for cryptogenic stroke on 03/06/2020. Per summary report patient has 3 AF events logged with longest episode 4 minutes on 09/05/2020. 5 brady events logged, reviewed by Luster Landsberg, PA-C and reports are false.   Spoke to patient and his wife (patient gave verbal permission to speak to wife). Wife reports during 3 AF events logged on 09/05/20 between 14:33 - 16:01, patient was helping his wife plant flowers outside on the patio.  Patient reports he was asymptomatic and was not aware of AF.   Reviewed EGM's with Renee PA-C & Dr. Lalla Brothers, report EGM's do appear atrial fibrillation. Dr. Lalla Brothers would like for patient to follow up with AF clinic to be seen in office for evaluation ASAP. Patient advised of plan and agreeable.   Patient wife reports patient is currently taking a medication that is a "blind study" Brien Mates, and will need to advise provider prior to starting [facility].   Patient request to call this phone number provided to contact patient [phone].

## 2020-09-22 NOTE — Telephone Encounter (Signed)
The patient agreed to send a transmission with his home monitor. Transmission received. I told him the nurse will give him a call back after she review the transmission.

## 2020-09-23 ENCOUNTER — Other Ambulatory Visit: Payer: Self-pay

## 2020-09-23 ENCOUNTER — Ambulatory Visit (HOSPITAL_COMMUNITY)
Admission: RE | Admit: 2020-09-23 | Discharge: 2020-09-23 | Disposition: A | Discharge status: Home / Self Care | Payer: 59 | Source: Ambulatory Visit | Attending: Nurse Practitioner | Admitting: Nurse Practitioner

## 2020-09-23 ENCOUNTER — Encounter (HOSPITAL_COMMUNITY): Payer: Self-pay | Admitting: Nurse Practitioner

## 2020-09-23 VITALS — BP 134/72 | HR 85 | Ht 70.0 in | Wt 279.6 lb

## 2020-09-23 DIAGNOSIS — Z8249 Family history of ischemic heart disease and other diseases of the circulatory system: Secondary | ICD-10-CM | POA: Insufficient documentation

## 2020-09-23 DIAGNOSIS — I4891 Unspecified atrial fibrillation: Secondary | ICD-10-CM | POA: Diagnosis not present

## 2020-09-23 DIAGNOSIS — Z91040 Latex allergy status: Secondary | ICD-10-CM | POA: Insufficient documentation

## 2020-09-23 DIAGNOSIS — Z79899 Other long term (current) drug therapy: Secondary | ICD-10-CM | POA: Insufficient documentation

## 2020-09-23 DIAGNOSIS — F17210 Nicotine dependence, cigarettes, uncomplicated: Secondary | ICD-10-CM | POA: Diagnosis not present

## 2020-09-23 DIAGNOSIS — Z7901 Long term (current) use of anticoagulants: Secondary | ICD-10-CM | POA: Insufficient documentation

## 2020-09-23 DIAGNOSIS — I10 Essential (primary) hypertension: Secondary | ICD-10-CM | POA: Insufficient documentation

## 2020-09-23 MED ORDER — ELIQUIS 5 MG PO TABS: 5.0000 mg | ORAL_TABLET | Freq: Two times a day (BID) | ORAL | 3 refills | Status: DC

## 2020-09-23 NOTE — Telephone Encounter (Signed)
Called and spoke with patient's wife and she is aware of appt today 09/23/20 at 10:30 am with Afib Clinic.

## 2020-09-23 NOTE — Patient Instructions (Signed)
Stop study drug   Start Eliquis 5mg  twice a day

## 2020-09-23 NOTE — Progress Notes (Signed)
Primary Care Physician: Hannah Beat, MD Referring Physician: Dr. Ermal[facility] clinic   [name] [name] is a 60 y.o. male with a h/o hypertension, hyperlipidemia, type 2 diabetes mellitus, restless leg syndrome, diabetic neuropathy, morbid obesity, tobacco abuse, depression/anxiety, GERD, chronic venous stasis ulcer in bilateral lower extremities that presented with stroke symptoms 01/2020. Left occipital lobe infarct was noted on the CT perfusion study He has a Linq implanted.  He is in the afib clinic as his LInq has shown new onset afib with 3 recent episodes on 09/05/20 between 14:33 pm  to 16:01 pm. Reviewed by Dr. Lalla Brothers and determined he was in afib.  He is now in the afib clinic to discuss start of anticoagulation. Hr was receiving Acardia, research drug, given  after stroke, blinded to either asa or eliquis. Wife discussed with Dr. Pearlean Brownie and he said to stop study drug. I discussed starting eliquis 5 mg bid to reduce stoke risk with now seen afib and he is in agreement. No bleeding history. He reports that he has stopped smoking, is now starting Chantix, he has lost 135 lbs over the last 2 years but primarily since l;ast fall. He is exercising and no longer requires CPAP for snoring . He walks with a cane but no falls. Moderate caffeine use.   Today, he denies symptoms of palpitations, chest pain, shortness of breath, orthopnea, PND, lower extremity edema, dizziness, presyncope, syncope, or neurologic sequela. The patient is tolerating medications without difficulties and is otherwise without complaint today.   Past Medical History:  Diagnosis Date  . Angio-edema   . Anxiety   . Depression   . Diabetes mellitus type II    type 2  . GERD (gastroesophageal reflux disease)   . Headache    migraine  . History of kidney stones   . HLD (hyperlipidemia)   . HTN (hypertension)   . Morbid obesity (HCC)   . Restless legs syndrome (RLS) 11/20/2012  . Stroke (HCC) 02/14/2020    ischemic/right sided affected  . Tobacco abuse   . Ulcer of left ankle (HCC)    last 2 months dime size dry dressing changing q day  . Urticaria    Past Surgical History:  Procedure Laterality Date  . Arm surgery Right 1969   Abscess excision and debridement at elbow  . COLONOSCOPY WITH PROPOFOL N/A 09/06/2016   Procedure: COLONOSCOPY WITH PROPOFOL;  Surgeon: Sherrilyn Rist, MD;  Location: [facility];  Service: Gastroenterology;  Laterality: N/A;  . I & D EXTREMITY Left 05/29/2014   Procedure: IRRIGATION AND DEBRIDEMENT OF LEFT LEG WOUND AND PLACEMENT OF  INTEGRA  AND  VAC;  Surgeon: Wayland Denis, DO;  Location: [facility];  Service: Plastics;  Laterality: Left;  . implantable loop recorder implant  03/06/2020   Medtronic Reveal [place] model LNQ11 (SN  [id number]) implanted by Dr Johney Frame for cryptogenic stroke  . INCISION AND DRAINAGE OF WOUND Left 04/23/2014   Procedure: IRRIGATION AND DEBRIDEMENT LOWER LEFT LEG WOUND WITH PLACEMENT OF INTEGRA AND VAC;  Surgeon: Wayland Denis, DO;  Location: [facility];  Service: Plastics;  Laterality: Left;  . kidney stone removal  2004  . MINOR APPLICATION OF WOUND VAC Left 04/23/2014   Procedure: MINOR APPLICATION OF WOUND VAC;  Surgeon: Wayland Denis, DO;  Location:  SURGERY CENTER;  Service: Plastics;  Laterality: Left;  . SKIN SPLIT GRAFT Left 06/18/2014   Procedure: SKIN GRAFT SPLIT THICKNESS TO LOWER LEFT LEG WOUND WITH PLACEMENT OF  VAC;  Surgeon: Wayland Denis, DO;  Location: [facility];  Service: Plastics;  Laterality: Left;  Marland Kitchen VASECTOMY  1994    Current Outpatient Medications  Medication Sig Dispense Refill  . acetaminophen (TYLENOL) 500 MG tablet Take 1,000 mg by mouth 2 (two) times daily as needed (for pain).    Marland Kitchen apixaban (ELIQUIS) 5 MG TABS tablet Take 1 tablet (5 mg total) by mouth 2 (two) times daily. 60 tablet 3  . bromocriptine (PARLODEL) 2.5 MG tablet Take 0.5 tablets (1.25 mg  total) by mouth at bedtime. 45 tablet 3  . colesevelam (WELCHOL) 625 MG tablet TAKE 3 TABLETS(1875 MG) BY MOUTH DAILY 90 tablet 1  . dapagliflozin propanediol (FARXIGA) 10 MG TABS tablet TAKE 1 TABLET BY MOUTH DAILY BEFORE BREAKFAST 90 tablet 1  . EPINEPHrine 0.3 mg/0.3 mL IJ SOAJ injection ADMINISTER 0.3 ML IN THE MUSCLE 1 TIME FOR 1 DOSE 2 each 0  . glipiZIDE (GLUCOTROL) 5 MG tablet Take 1 tablet (5 mg total) by mouth daily before breakfast. 90 tablet 3  . glucose blood (CONTOUR NEXT TEST) test strip USE TO CHECK BLOOD SUGAR 100 strip 5  . Lancets Micro Thin 33G MISC Use to check blood sugar two times daily. 100 each 5  . lisinopril (ZESTRIL) 40 MG tablet Take 1 tablet (40 mg total) by mouth daily. 90 tablet 1  . metFORMIN (GLUCOPHAGE-XR) 500 MG 24 hr tablet Take 4 tablets by mouth daily with breakfast 360 tablet 1  . Multiple Vitamin (MULTIVITAMIN) tablet Take 1 tablet by mouth daily.    . pantoprazole (PROTONIX) 40 MG tablet Take 1 tablet (40 mg total) by mouth daily. 90 tablet 3  . PARoxetine (PAXIL) 20 MG tablet Take 3 tablets (60 mg total) by mouth daily. 270 tablet 1  . pentoxifylline (TRENTAL) 400 MG CR tablet TAKE 1 TABLET BY MOUTH 3  TIMES DAILY WITH MEALS 270 tablet 3  . pregabalin (LYRICA) 75 MG capsule Take 1 capsule (75 mg total) by mouth 3 (three) times daily. 270 capsule 1  . rOPINIRole (REQUIP) 0.5 MG tablet Take 1 tablet (0.5 mg total) by mouth 2 (two) times daily. 180 tablet 3  . rosuvastatin (CRESTOR) 20 MG tablet TAKE 1 TABLET(20 MG) BY MOUTH AT BEDTIME 90 tablet 3  . sildenafil (VIAGRA) 100 MG tablet Take 1 tablet (100 mg total) by mouth daily as needed for erectile dysfunction. 10 tablet 5  . TRULICITY 4.5 MG/0.5ML SOPN INJECT 4.5 MG INTO THE SKIN ONCE A WEEK 6 mL 3  . varenicline (CHANTIX STARTING MONTH PAK) 0.5 MG X 11 & 1 MG X 42 tablet Take one 0.5mg  tablet by mouth once daily for 3 days, then increase to one 0.5mg  tablet twice daily for 3 days, then increase to one 1mg   tablet twice daily. 53 tablet 0  . varenicline (CHANTIX) 1 MG tablet Take 1 tablet (1 mg total) by mouth 2 (two) times daily. 60 tablet 3   No current facility-administered medications for this encounter.    Allergies  Allergen Reactions  . Cashew Nut Oil Anaphylaxis    Cashews, 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  08/04/2016 Pistachio Nut (Diagnostic) Hives, Shortness Of Breath, Itching, Swelling and Rash  . Latex Dermatitis  . Tape Other (See Comments)    Tears and bruises the skin  . Betadine [Povidone Iodine] Rash and Other (See Comments)    Raised rash 04/23/14  . Lidocaine Other (See Comments)    Minimal effect with lidocaine, prefers different anesthetic  . Penicillins  Rash and Other (See Comments)    Rash all over as a child    Social History   Socioeconomic History  . Marital status: Married    Spouse name: Bankerkimberly Ell  . Number of children: 3  . Years of education: Not on file  . Highest education level: Not on file  Occupational History  . 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 Smoking status: Current Some Day Smoker    Packs/day: 0.25    Years: 40.00    Pack years: 10.00    Types: Cigarettes  . Smokeless tobacco: Never Used  . Tobacco comment: half pack daily  Substance and Sexual Activity  . Alcohol use: Not Currently    Alcohol/week: 0.0 standard drinks    Comment: rarely  . Drug use: No  . Sexual activity: Yes    Partners: Female  Other Topics Concern  . Not on file  Social History Narrative   Lives with wife   Right Handed   Drink 6-7 cups caffeine daily   Social Determinants of Health   Financial Resource Strain: Not on file  Food Insecurity: Not on file  Transportation Needs: Not on file  Physical Activity: Not on file  Stress: Not on file  Social Connections: Not on file  Intimate Partner Violence: Not on file    Family History  Problem Relation Age of Onset  . Stroke Mother         multiple  . Leukemia Mother   . Pneumonia Mother   . Cancer Mother   . Diabetes Mother   . Diabetes Father   . Heart disease Father   . Hyperlipidemia Father   . Hypertension Father   . Allergic rhinitis Neg Hx   . Angioedema Neg Hx   . Asthma Neg Hx   . Eczema Neg Hx   . Immunodeficiency Neg Hx   . Urticaria Neg Hx     ROS- All systems are reviewed and negative except as per the HPI above  Physical Exam: Vitals:   09/23/20 1040  BP: 134/72  Pulse: 85  Weight: 126.8 kg  Height: 5\' 10"  (1.778 m)   Wt Readings from Last 3 Encounters:  09/23/20 126.8 kg  09/21/20 127.2 kg  08/11/20 129.9 kg    Labs: Lab Results  Component Value Date   NA 141 06/17/2020   K 4.1 06/17/2020   CL 104 06/17/2020   CO2 24 06/17/2020   GLUCOSE 126 (H) 06/17/2020   BUN 17 06/17/2020   CREATININE 1.03 06/17/2020   CALCIUM 9.9 06/17/2020   Lab Results  Component Value Date   INR 0.9 02/14/2020   Lab Results  Component Value Date   CHOL 96 (L) 06/17/2020   HDL 30 (L) 06/17/2020   LDLCALC 36 06/17/2020   TRIG 186 (H) 06/17/2020     GEN- The patient is well appearing, alert and oriented x 3 today.   Head- normocephalic, atraumatic Eyes-  Sclera clear, conjunctiva pink Ears- hearing intact Oropharynx- clear Neck- supple, no JVP Lymph- no cervical lymphadenopathy Lungs- Clear to ausculation bilaterally, normal work of breathing Heart- Regular rate and rhythm, no murmurs, rubs or gallops, PMI not laterally displaced GI- soft, NT, ND, + BS Extremities- no clubbing, cyanosis, or edema MS- no significant deformity or atrophy Skin- no rash or lesion Psych- euthymic mood, full affect Neuro- strength and sensation are intact  EKG-Sinus rhythm with PVC's at 85 bpm, pr  int 172 ms, qrs int 110 ms, qtc 473 ms   Epic records reviewed  See device note   Assessment and Plan: 1. New onset afib  Very brief episodes noted  Will not start rate control at this point Continue monitoring  of afib thru Linq/device clinic   2. CHA2DS2VASc score of at least 4 He will stop study drug Acardia (asa vrs eliquis) per  Dr. Pearlean Brownie and start eliquis 5 mg bid  Bleeding  precautions discussed   3. Smoking cessation He has stopped smoking and starts Chantix today Discussed with PharmD- no interaction with Eliquis   4.Lifestyle issues Congratulated on his weight loss and exercise routine Because of weight loss, he no longer requires Cpap  5. HTN Stable   I will se back in 3-4 weeks and get a CBC at that time   Lupita Leash C. Matthew Folks [facility] [address] [place], [place] [address] [phone]

## 2020-10-06 NOTE — Progress Notes (Signed)
[facility] Summary Report / Loop Recorder 

## 2020-10-15 ENCOUNTER — Encounter (HOSPITAL_COMMUNITY): Payer: Self-pay | Admitting: Nurse Practitioner

## 2020-10-15 ENCOUNTER — Other Ambulatory Visit: Payer: Self-pay

## 2020-10-15 ENCOUNTER — Ambulatory Visit (HOSPITAL_COMMUNITY)
Admission: RE | Admit: 2020-10-15 | Discharge: 2020-10-15 | Disposition: A | Discharge status: Home / Self Care | Payer: 59 | Source: Ambulatory Visit | Attending: Nurse Practitioner | Admitting: Nurse Practitioner

## 2020-10-15 VITALS — BP 136/56 | HR 80 | Ht 70.0 in | Wt 280.4 lb

## 2020-10-15 DIAGNOSIS — Z79899 Other long term (current) drug therapy: Secondary | ICD-10-CM | POA: Diagnosis not present

## 2020-10-15 DIAGNOSIS — D6869 Other thrombophilia: Secondary | ICD-10-CM | POA: Diagnosis not present

## 2020-10-15 DIAGNOSIS — I4891 Unspecified atrial fibrillation: Secondary | ICD-10-CM

## 2020-10-15 DIAGNOSIS — Z6841 Body Mass Index (BMI) 40.0 and over, adult: Secondary | ICD-10-CM | POA: Insufficient documentation

## 2020-10-15 DIAGNOSIS — Z87891 Personal history of nicotine dependence: Secondary | ICD-10-CM | POA: Insufficient documentation

## 2020-10-15 DIAGNOSIS — I10 Essential (primary) hypertension: Secondary | ICD-10-CM | POA: Diagnosis not present

## 2020-10-15 DIAGNOSIS — I4510 Unspecified right bundle-branch block: Secondary | ICD-10-CM | POA: Diagnosis not present

## 2020-10-15 DIAGNOSIS — I493 Ventricular premature depolarization: Secondary | ICD-10-CM | POA: Diagnosis not present

## 2020-10-15 DIAGNOSIS — E785 Hyperlipidemia, unspecified: Secondary | ICD-10-CM | POA: Insufficient documentation

## 2020-10-15 LAB — CBC
HCT: 50.6 % (ref 39.0–52.0)
Hemoglobin: 17.3 g/dL — ABNORMAL HIGH (ref 13.0–17.0)
MCH: 28.9 pg (ref 26.0–34.0)
MCHC: 34.2 g/dL (ref 30.0–36.0)
MCV: 84.6 fL (ref 80.0–100.0)
Platelets: 201 10*3/uL (ref 150–400)
RBC: 5.98 MIL/uL — ABNORMAL HIGH (ref 4.22–5.81)
RDW: 13.7 % (ref 11.5–15.5)
WBC: 10.1 10*3/uL (ref 4.0–10.5)
nRBC: 0 % (ref 0.0–0.2)

## 2020-10-15 LAB — BASIC METABOLIC PANEL
Anion gap: 9 (ref 5–15)
BUN: 17 mg/dL (ref 6–20)
CO2: 26 mmol/L (ref 22–32)
Calcium: 9.4 mg/dL (ref 8.9–10.3)
Chloride: 104 mmol/L (ref 98–111)
Creatinine, Ser: 1.17 mg/dL (ref 0.61–1.24)
GFR, Estimated: >60 mL/min (ref >60)
Glucose, Bld: 83 mg/dL (ref 70–99)
Potassium: 4.1 mmol/L (ref 3.5–5.1)
Sodium: 139 mmol/L (ref 135–145)

## 2020-10-15 NOTE — Progress Notes (Signed)
Primary Care Physician: Hannah Beat, MD Referring Physician: Dr. [facility]   [name] [name] is a 60 y.o. male with a h/o hypertension, hyperlipidemia, type 2 diabetes mellitus, restless leg syndrome, diabetic neuropathy, morbid obesity, tobacco abuse, depression/anxiety, GERD, chronic venous stasis ulcer in bilateral lower extremities that presented with stroke symptoms 01/2020. Left occipital lobe infarct was noted on the CT perfusion study He has a Linq implanted.  He is in the afib clinic as his LInq has shown new onset afib with 3 recent episodes on 09/05/20 between 14:33 pm  to 16:01 pm. Reviewed by Dr. Lalla Brothers and determined he was in afib.  He is now in the afib clinic to discuss start of anticoagulation. Hr was receiving Acardia, research drug, given  after stroke, blinded to either asa or eliquis. Wife discussed with Dr. Pearlean Brownie and he said to stop study drug. I discussed starting eliquis 5 mg bid to reduce stoke risk with now seen afib and he is in agreement. No bleeding history. He reports that he has stopped smoking, is now starting Chantix, he has lost 135 lbs over the last 2 years but primarily since l;ast fall. He is exercising and no longer requires CPAP for snoring . He walks with a cane but no falls. Moderate caffeine use.   F/u in afib clinic, 10/15/20. HE id doing well with start of anticoagulation. No bleeding issues. Has noted a mild H/A on awakening a few mornings but is brief. He is not snoring or apnea. PT  questioning if related to DOAC and I do not usually hear this. No awareness of irregular HB and is in SR today.   Today, he denies symptoms of palpitations, chest pain, shortness of breath, orthopnea, PND, lower extremity edema, dizziness, presyncope, syncope, or neurologic sequela. The patient is tolerating medications without difficulties and is otherwise without complaint today.   Past Medical History:  Diagnosis Date  . Angio-edema   . Anxiety   .  Depression   . Diabetes mellitus type II    type 2  . GERD (gastroesophageal reflux disease)   . Headache    migraine  . History of kidney stones   . HLD (hyperlipidemia)   . HTN (hypertension)   . Morbid obesity (HCC)   . Restless legs syndrome (RLS) 11/20/2012  . Stroke (HCC) 02/14/2020   ischemic/right sided affected  . Tobacco abuse   . Ulcer of left ankle (HCC)    last 2 months dime size dry dressing changing q day  . Urticaria    Past Surgical History:  Procedure Laterality Date  . Arm surgery Right 1969   Abscess excision and debridement at elbow  . COLONOSCOPY WITH PROPOFOL N/A 09/06/2016   Procedure: COLONOSCOPY WITH PROPOFOL;  Surgeon: Sherrilyn Rist, MD;  Location: [facility];  Service: Gastroenterology;  Laterality: N/A;  . I & D EXTREMITY Left 05/29/2014   Procedure: IRRIGATION AND DEBRIDEMENT OF LEFT LEG WOUND AND PLACEMENT OF  INTEGRA  AND  VAC;  Surgeon: Wayland Denis, DO;  Location: [facility];  Service: Plastics;  Laterality: Left;  . implantable loop recorder implant  03/06/2020   Medtronic Reveal [place] model LNQ11 (SN  [id number]) implanted by Dr Johney Frame for cryptogenic stroke  . INCISION AND DRAINAGE OF WOUND Left 04/23/2014   Procedure: IRRIGATION AND DEBRIDEMENT LOWER LEFT LEG WOUND WITH PLACEMENT OF INTEGRA AND VAC;  Surgeon: Wayland Denis, DO;  Location: [facility];  Service: Plastics;  Laterality: Left;  .  kidney stone removal  2004  . MINOR APPLICATION OF WOUND VAC Left 04/23/2014   Procedure: MINOR APPLICATION OF WOUND VAC;  Surgeon: Wayland Denis, DO;  Location: [facility];  Service: Plastics;  Laterality: Left;  . SKIN SPLIT GRAFT Left 06/18/2014   Procedure: SKIN GRAFT SPLIT THICKNESS TO LOWER LEFT LEG WOUND WITH PLACEMENT OF VAC;  Surgeon: Wayland Denis, DO;  Location: [facility];  Service: Plastics;  Laterality: Left;  Marland Kitchen VASECTOMY  1994    Current Outpatient Medications  Medication Sig  Dispense Refill  . acetaminophen (TYLENOL) 500 MG tablet Take 1,000 mg by mouth 2 (two) times daily as needed (for pain).    Marland Kitchen apixaban (ELIQUIS) 5 MG TABS tablet Take 1 tablet (5 mg total) by mouth 2 (two) times daily. 60 tablet 3  . bromocriptine (PARLODEL) 2.5 MG tablet Take 0.5 tablets (1.25 mg total) by mouth at bedtime. 45 tablet 3  . colesevelam (WELCHOL) 625 MG tablet TAKE 3 TABLETS(1875 MG) BY MOUTH DAILY 90 tablet 1  . dapagliflozin propanediol (FARXIGA) 10 MG TABS tablet TAKE 1 TABLET BY MOUTH DAILY BEFORE BREAKFAST 90 tablet 1  . EPINEPHrine 0.3 mg/0.3 mL IJ SOAJ injection ADMINISTER 0.3 ML IN THE MUSCLE 1 TIME FOR 1 DOSE 2 each 0  . glipiZIDE (GLUCOTROL) 5 MG tablet Take 1 tablet (5 mg total) by mouth daily before breakfast. 90 tablet 3  . glucose blood (CONTOUR NEXT TEST) test strip USE TO CHECK BLOOD SUGAR 100 strip 5  . Lancets Micro Thin 33G MISC Use to check blood sugar two times daily. 100 each 5  . lisinopril (ZESTRIL) 40 MG tablet Take 1 tablet (40 mg total) by mouth daily. 90 tablet 1  . metFORMIN (GLUCOPHAGE-XR) 500 MG 24 hr tablet Take 4 tablets by mouth daily with breakfast 360 tablet 1  . Multiple Vitamin (MULTIVITAMIN) tablet Take 1 tablet by mouth daily.    . pantoprazole (PROTONIX) 40 MG tablet Take 1 tablet (40 mg total) by mouth daily. 90 tablet 3  . PARoxetine (PAXIL) 20 MG tablet Take 3 tablets (60 mg total) by mouth daily. 270 tablet 1  . pentoxifylline (TRENTAL) 400 MG CR tablet TAKE 1 TABLET BY MOUTH 3  TIMES DAILY WITH MEALS 270 tablet 3  . pregabalin (LYRICA) 75 MG capsule Take 1 capsule (75 mg total) by mouth 3 (three) times daily. 270 capsule 1  . rOPINIRole (REQUIP) 0.5 MG tablet Take 1 tablet (0.5 mg total) by mouth 2 (two) times daily. 180 tablet 3  . rosuvastatin (CRESTOR) 20 MG tablet TAKE 1 TABLET(20 MG) BY MOUTH AT BEDTIME 90 tablet 3  . sildenafil (VIAGRA) 100 MG tablet Take 1 tablet (100 mg total) by mouth daily as needed for erectile dysfunction.  10 tablet 5  . TRULICITY 4.5 MG/0.5ML SOPN INJECT 4.5 MG INTO THE SKIN ONCE A WEEK 6 mL 3  . varenicline (CHANTIX STARTING MONTH PAK) 0.5 MG X 11 & 1 MG X 42 tablet Take one 0.5mg  tablet by mouth once daily for 3 days, then increase to one 0.5mg  tablet twice daily for 3 days, then increase to one 1mg  tablet twice daily. 53 tablet 0  . varenicline (CHANTIX) 1 MG tablet Take 1 tablet (1 mg total) by mouth 2 (two) times daily. 60 tablet 3   No current facility-administered medications for this encounter.    Allergies  Allergen Reactions  . Cashew Nut Oil Anaphylaxis    Cashews, 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  08/04/2016 Pistachio Nut (Diagnostic)  Hives, Shortness Of Breath, Itching, Swelling and Rash  . Latex Dermatitis  . Tape Other (See Comments)    Tears and bruises the skin  . Betadine [Povidone Iodine] Rash and Other (See Comments)    Raised rash 04/23/14  . Lidocaine Other (See Comments)    Minimal effect with lidocaine, prefers different anesthetic  . Penicillins Rash and Other (See Comments)    Rash all over as a child    Social History   Socioeconomic History  . Marital status: Married    Spouse name: Bankerkimberly Wakefield  . Number of children: 3  . Years of education: Not on file  . Highest education level: Not on file  Occupational History  . 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 Smoking status: Current Some Day Smoker    Packs/day: 0.25    Years: 40.00    Pack years: 10.00    Types: Cigarettes  . Smokeless tobacco: Never Used  . Tobacco comment: half pack daily  Substance and Sexual Activity  . Alcohol use: Not Currently    Alcohol/week: 0.0 standard drinks    Comment: rarely  . Drug use: No  . Sexual activity: Yes    Partners: Female  Other Topics Concern  . Not on file  Social History Narrative   Lives with wife   Right Handed   Drink 6-7 cups caffeine daily   Social Determinants of Health   Financial  Resource Strain: Not on file  Food Insecurity: Not on file  Transportation Needs: Not on file  Physical Activity: Not on file  Stress: Not on file  Social Connections: Not on file  Intimate Partner Violence: Not on file    Family History  Problem Relation Age of Onset  . Stroke Mother        multiple  . Leukemia Mother   . Pneumonia Mother   . Cancer Mother   . Diabetes Mother   . Diabetes Father   . Heart disease Father   . Hyperlipidemia Father   . Hypertension Father   . Allergic rhinitis Neg Hx   . Angioedema Neg Hx   . Asthma Neg Hx   . Eczema Neg Hx   . Immunodeficiency Neg Hx   . Urticaria Neg Hx     ROS- All systems are reviewed and negative except as per the HPI above  Physical Exam: Vitals:   10/15/20 1026  BP: (!) 136/56  Pulse: 80  Weight: 127.2 kg  Height: 5\' 10"  (1.778 m)   Wt Readings from Last 3 Encounters:  10/15/20 127.2 kg  09/23/20 126.8 kg  09/21/20 127.2 kg    Labs: Lab Results  Component Value Date   NA 139 10/15/2020   K 4.1 10/15/2020   CL 104 10/15/2020   CO2 26 10/15/2020   GLUCOSE 83 10/15/2020   BUN 17 10/15/2020   CREATININE 1.17 10/15/2020   CALCIUM 9.4 10/15/2020   Lab Results  Component Value Date   INR 0.9 02/14/2020   Lab Results  Component Value Date   CHOL 96 (L) 06/17/2020   HDL 30 (L) 06/17/2020   LDLCALC 36 06/17/2020   TRIG 186 (H) 06/17/2020     GEN- The patient is well appearing, alert and oriented x 3 today.   Head- normocephalic, atraumatic Eyes-  Sclera clear, conjunctiva pink Ears- hearing intact Oropharynx- clear Neck- supple, no JVP Lymph- no cervical lymphadenopathy Lungs- Clear to ausculation bilaterally,  normal work of breathing Heart- Regular rate and rhythm, no murmurs, rubs or gallops, PMI not laterally displaced GI- soft, NT, ND, + BS Extremities- no clubbing, cyanosis, or edema MS- no significant deformity or atrophy Skin- no rash or lesion Psych- euthymic mood, full  affect Neuro- strength and sensation are intact  EKG-Sinus rhythm with PVC's at 80 bpm, pr int 162 ms, qrs int 116 ms, qtc 459 ms   Epic records reviewed  See device note   Assessment and Plan: 1. New onset afib  Very brief episodes noted  Pt unaware  Will not start rate control at this point Continue monitoring of afib thru Linq/device clinic   2. CHA2DS2VASc score of at least 4 He stopped  study drug Acardia (asa vrs eliquis) per  Dr. Pearlean Brownie and started eliquis 5 mg bid  Bleeding  precautions discussed   3. Smoking cessation He has stopped smoking and is on Chantix    4.Lifestyle issues Congratulated on his weight loss and exercise routine Because of weight loss, he no longer requires Cpap  5. HTN Stable   F/u  with device clinic,  PCP and Neuro as scheduled    Lupita Leash C. [facility] [address] [place], [place] [address] [phone]

## 2020-10-16 ENCOUNTER — Other Ambulatory Visit: Payer: Self-pay | Admitting: Family Medicine

## 2020-10-19 ENCOUNTER — Ambulatory Visit (INDEPENDENT_AMBULATORY_CARE_PROVIDER_SITE_OTHER): Payer: 59

## 2020-10-19 ENCOUNTER — Telehealth: Payer: Self-pay | Admitting: *Deleted

## 2020-10-19 DIAGNOSIS — I639 Cerebral infarction, unspecified: Secondary | ICD-10-CM | POA: Diagnosis not present

## 2020-10-19 NOTE — Telephone Encounter (Signed)
Last office visit 09/21/2020 for 3 month follow up.  Last refilled 04/22/2020 for #270 with 1 refill.  Next Appt: 03/29/2021 for 6 month follow up.

## 2020-10-19 NOTE — Telephone Encounter (Signed)
Fax came in requesting information for reviewing long-term disability claim. Information printed with fax cover sheet place in nurse fax box in medical records.

## 2020-10-21 LAB — CUP PACEART REMOTE DEVICE CHECK
Date Time Interrogation Session: 20220521000500
Implantable Pulse Generator Implant Date: 20211008

## 2020-10-24 LAB — CUP PACEART REMOTE DEVICE CHECK
Date Time Interrogation Session: 20220527114857
Implantable Pulse Generator Implant Date: 20211008

## 2020-10-27 ENCOUNTER — Telehealth (HOSPITAL_COMMUNITY): Payer: Self-pay | Admitting: *Deleted

## 2020-10-27 NOTE — Telephone Encounter (Signed)
Patient called to AF clinic inquiring about his recent linq report that resulted on mychart stating multiple bradycardia episodes. Reviewed linq report with Rudi Coco NP who requested report be forwarded to device clinic for EP review as strip today reported out as possible complete heart block. Will forward to device clinic for EP review. Pt can be reached at [phone]

## 2020-10-27 NOTE — Telephone Encounter (Addendum)
Attempted to contact patient about his transmission for bradycardia episodes and an alert received for possible complete block. Left message for patient advising that I would send information to Dr. Johney Frame.  Patient concerned from notes received in My Chart. Reviewed 2  transmissions with Dr. Elberta Fortis and he advised just to route to Dr. Johney Frame for review.

## 2020-11-02 NOTE — Telephone Encounter (Signed)
Left message for patient to contact the Device Clinic at [phone] to discuss recent transmission concerns. See previous notes in Epic. Dr. Johney Frame reviewed transmission and finds no complete heart block.   Message Received: Today Hillis Range, MD  P [facility] Remote device check reviewed.   Device report notable for: this is sinus with IVCD and frequent PVCs, not complete heart block.

## 2020-11-02 NOTE — Telephone Encounter (Signed)
Return call at [phone] to speak with patient about result note from Dr. Johney Frame.

## 2020-11-02 NOTE — Telephone Encounter (Signed)
Spoke with patient and family and advised them of Dr. Jenel Lucks result. I advised patient that if he ever had any questions or concerns about his device  to contact the Device Clinic at [phone].

## 2020-11-02 NOTE — Telephone Encounter (Signed)
Pt left a message returning nurse call. His phone number is [phone].

## 2020-11-09 ENCOUNTER — Telehealth: Payer: Self-pay | Admitting: Emergency Medicine

## 2020-11-09 NOTE — Progress Notes (Signed)
[facility] Summary Report / Loop Recorder 

## 2020-11-09 NOTE — Telephone Encounter (Signed)
No answer at # listed.  Need remote transmission to view all events recorded on Linq.

## 2020-11-19 ENCOUNTER — Ambulatory Visit: Payer: Managed Care, Other (non HMO)

## 2020-11-24 ENCOUNTER — Ambulatory Visit (INDEPENDENT_AMBULATORY_CARE_PROVIDER_SITE_OTHER): Payer: 59 | Admitting: Endocrinology

## 2020-11-24 ENCOUNTER — Other Ambulatory Visit: Payer: Self-pay

## 2020-11-24 VITALS — BP 132/88 | HR 81 | Ht 70.0 in | Wt 279.8 lb

## 2020-11-24 DIAGNOSIS — L97929 Non-pressure chronic ulcer of unspecified part of left lower leg with unspecified severity: Secondary | ICD-10-CM | POA: Diagnosis not present

## 2020-11-24 DIAGNOSIS — E11622 Type 2 diabetes mellitus with other skin ulcer: Secondary | ICD-10-CM

## 2020-11-24 DIAGNOSIS — E1159 Type 2 diabetes mellitus with other circulatory complications: Secondary | ICD-10-CM

## 2020-11-24 LAB — POCT GLYCOSYLATED HEMOGLOBIN (HGB A1C): Hemoglobin A1C: 6 % — AB (ref 4.0–5.6)

## 2020-11-24 MED ORDER — GLIPIZIDE 5 MG PO TABS: 2.5000 mg | ORAL_TABLET | Freq: Every day | ORAL | 3 refills | Status: DC

## 2020-11-24 NOTE — Telephone Encounter (Signed)
I spoke with the patient and his wife they agreed to send the transmission when he get home. She wants to know why it has taken two weeks for [place] to get in touch with them. She is very upset.

## 2020-11-24 NOTE — Progress Notes (Signed)
Subjective:    Patient ID: [name] [name], male    DOB: December 19, 1960, 60 y.o.   MRN: [id number]  HPI Pt returns for f/u of diabetes mellitus:  DM type: 2 Dx'ed: 2010 Complications: PN, CVA's, and foot ulcers.   Therapy: trulicity and 5 oral meds. DKA: never Severe hypoglycemia: once, in 2016 Pancreatitis: never Pancreatic imaging: never SDOH: wife provides most of hx, due to pt's difficulty with concentration.   Other: he has never been on insulin; wife is nurse, and pt has also administered insulin to his father; edema limits rx options.   Interval history: no cbg record, but states cbg varies from 60-140.  Pt is here with wife today.  He takes less than rx'ed welchol, due to constipation.   Past Medical History:  Diagnosis Date   Angio-edema    Anxiety    Depression    Diabetes mellitus type II    type 2   GERD (gastroesophageal reflux disease)    Headache    migraine   History of kidney stones    HLD (hyperlipidemia)    HTN (hypertension)    Morbid obesity (HCC)    Restless legs syndrome (RLS) 11/20/2012   Stroke (HCC) 02/14/2020   ischemic/right sided affected   Tobacco abuse    Ulcer of left ankle (HCC)    last 2 months dime size dry dressing changing q day   Urticaria     Past Surgical History:  Procedure Laterality Date   Arm surgery Right 1969   Abscess excision and debridement at elbow   COLONOSCOPY WITH PROPOFOL N/A 09/06/2016   Procedure: COLONOSCOPY WITH PROPOFOL;  Surgeon: Sherrilyn Rist, MD;  Location: [facility];  Service: Gastroenterology;  Laterality: N/A;   I & D EXTREMITY Left 05/29/2014   Procedure: IRRIGATION AND DEBRIDEMENT OF LEFT LEG WOUND AND PLACEMENT OF  INTEGRA  AND  VAC;  Surgeon: Wayland Denis, DO;  Location: [facility];  Service: Plastics;  Laterality: Left;   implantable loop recorder implant  03/06/2020   Medtronic Reveal [place] model LNQ11 (SN  [id number]) implanted by Dr Johney Frame for cryptogenic stroke   INCISION AND  DRAINAGE OF WOUND Left 04/23/2014   Procedure: IRRIGATION AND DEBRIDEMENT LOWER LEFT LEG WOUND WITH PLACEMENT OF INTEGRA AND VAC;  Surgeon: Wayland Denis, DO;  Location: [facility];  Service: Plastics;  Laterality: Left;   kidney stone removal  2004   MINOR APPLICATION OF WOUND VAC Left 04/23/2014   Procedure: MINOR APPLICATION OF WOUND VAC;  Surgeon: Wayland Denis, DO;  Location: [facility];  Service: Plastics;  Laterality: Left;   SKIN SPLIT GRAFT Left 06/18/2014   Procedure: SKIN GRAFT SPLIT THICKNESS TO LOWER LEFT LEG WOUND WITH PLACEMENT OF VAC;  Surgeon: Wayland Denis, DO;  Location: [facility];  Service: Plastics;  Laterality: Left;   VASECTOMY  1994    Social History   Socioeconomic History   Marital status: Married    Spouse name: Banker   Number of children: 3   Years of education: Not on file   Highest education level: Not on file  Occupational History   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    Comment: no longer working 07/07/20  Tobacco Use   Smoking status: Some Days    Packs/day: 0.25    Years: 40.00    Pack years: 10.00    Types: Cigarettes   Smokeless tobacco: Never   Tobacco comments:    half pack  daily  Substance and Sexual Activity   Alcohol use: Not Currently    Alcohol/week: 0.0 standard drinks    Comment: rarely   Drug use: No   Sexual activity: Yes    Partners: Female  Other Topics Concern   Not on file  Social History Narrative   Lives with wife   Right Handed   Drink 6-7 cups caffeine daily   Social Determinants of Health   Financial Resource Strain: Not on file  Food Insecurity: Not on file  Transportation Needs: Not on file  Physical Activity: Not on file  Stress: Not on file  Social Connections: Not on file  Intimate Partner Violence: Not on file    Current Outpatient Medications on File Prior to Visit  Medication Sig Dispense Refill   acetaminophen (TYLENOL) 500 MG  tablet Take 1,000 mg by mouth 2 (two) times daily as needed (for pain).     apixaban (ELIQUIS) 5 MG TABS tablet Take 1 tablet (5 mg total) by mouth 2 (two) times daily. 60 tablet 3   bromocriptine (PARLODEL) 2.5 MG tablet Take 0.5 tablets (1.25 mg total) by mouth at bedtime. 45 tablet 3   dapagliflozin propanediol (FARXIGA) 10 MG TABS tablet TAKE 1 TABLET BY MOUTH DAILY BEFORE BREAKFAST 90 tablet 1   EPINEPHrine 0.3 mg/0.3 mL IJ SOAJ injection ADMINISTER 0.3 ML IN THE MUSCLE 1 TIME FOR 1 DOSE 2 each 0   glucose blood (CONTOUR NEXT TEST) test strip USE TO CHECK BLOOD SUGAR 100 strip 5   lisinopril (ZESTRIL) 40 MG tablet Take 1 tablet (40 mg total) by mouth daily. 90 tablet 1   metFORMIN (GLUCOPHAGE-XR) 500 MG 24 hr tablet Take 4 tablets by mouth daily with breakfast 360 tablet 1   Multiple Vitamin (MULTIVITAMIN) tablet Take 1 tablet by mouth daily.     pantoprazole (PROTONIX) 40 MG tablet Take 1 tablet (40 mg total) by mouth daily. 90 tablet 3   PARoxetine (PAXIL) 20 MG tablet Take 3 tablets (60 mg total) by mouth daily. 270 tablet 1   pentoxifylline (TRENTAL) 400 MG CR tablet TAKE 1 TABLET BY MOUTH 3  TIMES DAILY WITH MEALS 270 tablet 3   pregabalin (LYRICA) 75 MG capsule TAKE 1 CAPSULE(75 MG) BY MOUTH THREE TIMES DAILY 270 capsule 1   rOPINIRole (REQUIP) 0.5 MG tablet Take 1 tablet (0.5 mg total) by mouth 2 (two) times daily. 180 tablet 3   rosuvastatin (CRESTOR) 20 MG tablet TAKE 1 TABLET(20 MG) BY MOUTH AT BEDTIME 90 tablet 3   sildenafil (VIAGRA) 100 MG tablet Take 1 tablet (100 mg total) by mouth daily as needed for erectile dysfunction. 10 tablet 5   TRULICITY 4.5 MG/0.5ML SOPN INJECT 4.5 MG INTO THE SKIN ONCE A WEEK 6 mL 3   varenicline (CHANTIX STARTING MONTH PAK) 0.5 MG X 11 & 1 MG X 42 tablet Take one 0.5mg  tablet by mouth once daily for 3 days, then increase to one 0.5mg  tablet twice daily for 3 days, then increase to one 1mg  tablet twice daily. 53 tablet 0   varenicline (CHANTIX) 1 MG  tablet Take 1 tablet (1 mg total) by mouth 2 (two) times daily. 60 tablet 3   No current facility-administered medications on file prior to visit.    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   Pistachio Nut (Diagnostic) Hives, Shortness Of Breath, Itching, Swelling and Rash   Latex Dermatitis   Tape Other (See Comments)  Tears and bruises the skin   Betadine [Povidone Iodine] Rash and Other (See Comments)    Raised rash 04/23/14   Lidocaine Other (See Comments)    Minimal effect with lidocaine, prefers different anesthetic   Penicillins Rash and Other (See Comments)    Rash all over as a child    Family History  Problem Relation Age of Onset   Stroke Mother        multiple   Leukemia Mother    Pneumonia Mother    Cancer Mother    Diabetes Mother    Diabetes Father    Heart disease Father    Hyperlipidemia Father    Hypertension Father    Allergic rhinitis Neg Hx    Angioedema Neg Hx    Asthma Neg Hx    Eczema Neg Hx    Immunodeficiency Neg Hx    Urticaria Neg Hx     BP 132/88   Pulse 81   Ht 5\' 10"  (1.778 m)   Wt 279 lb 12.8 oz (126.9 kg)   SpO2 94%   BMI 40.15 kg/m    Review of Systems Denies LOC    Objective:   Physical Exam GENERAL: no distress Pulses: dorsalis pedis intact bilat.   MSK: no deformity of the feet CV: 2+ bilat leg edema, and bilat vv's.  Skin: healed ulcers on the feet and legs.  normal color and temp on the feet, but there is (chronic) erythema of the feet.  Neuro: sensation is intact to touch on the feet, but severely decreased from normal.    Lab Results  Component Value Date   HGBA1C 6.0 (A) 11/24/2020   Lab Results  Component Value Date   CREATININE 1.17 10/15/2020   BUN 17 10/15/2020   NA 139 10/15/2020   K 4.1 10/15/2020   CL 104 10/15/2020   CO2 26 10/15/2020       Assessment & Plan:  Type 2 DM: overcontrolled. Constipation, due to welchol.   Patient  Instructions  I have sent a prescription to your pharmacy, to reduce the glipizide to 1/2 of 5 mg each morning, and you can stop taking the colesevelam.   Please continue the same other diabetes medications.   check your blood sugar once a day.  vary the time of day when you check, between before the 3 meals, and at bedtime.  also check if you have symptoms of your blood sugar being too high or too low.  please keep a record of the readings and bring it to your next appointment here (or you can bring the meter itself).  You can write it on any piece of paper.  please call 10/17/2020 sooner if your blood sugar goes below 70, or if you have a lot of readings over 200.   Please come back for a follow-up appointment in 3 months.

## 2020-11-24 NOTE — Patient Instructions (Addendum)
I have sent a prescription to your pharmacy, to reduce the glipizide to 1/2 of 5 mg each morning, and you can stop taking the colesevelam.   Please continue the same other diabetes medications.   check your blood sugar once a day.  vary the time of day when you check, between before the 3 meals, and at bedtime.  also check if you have symptoms of your blood sugar being too high or too low.  please keep a record of the readings and bring it to your next appointment here (or you can bring the meter itself).  You can write it on any piece of paper.  please call us sooner if your blood sugar goes below 70, or if you have a lot of readings over 200.   Please come back for a follow-up appointment in 3 months.

## 2020-11-24 NOTE — Telephone Encounter (Signed)
Spoke with patient regarding unsuccessful telephone call from device clinic 11/09/20 to discuss bradycardia, which appears to be undersensing, and AF noted on loop recorder. Wife/patient concerned that it has been two week and no one has follow up. Provided reassurance and will personally follow up with Dr. Johney Frame in the am to see if sensitivity needs to be increased. Patient actively involved with Afib clinic with most recent visit with Rudi Coco, NP 10/15/20. Patient states he currently feels well, however concerned that he has nocturnal bradycardia. Will follow up with patient after discussing with Dr. Johney Frame. Patient very appreciative of reassurance and call.

## 2020-11-25 ENCOUNTER — Ambulatory Visit (INDEPENDENT_AMBULATORY_CARE_PROVIDER_SITE_OTHER): Payer: 59

## 2020-11-25 DIAGNOSIS — I639 Cerebral infarction, unspecified: Secondary | ICD-10-CM

## 2020-11-25 DIAGNOSIS — E11622 Type 2 diabetes mellitus with other skin ulcer: Secondary | ICD-10-CM

## 2020-11-25 DIAGNOSIS — L97929 Non-pressure chronic ulcer of unspecified part of left lower leg with unspecified severity: Secondary | ICD-10-CM

## 2020-11-25 LAB — CUP PACEART REMOTE DEVICE CHECK
Date Time Interrogation Session: 20220629115342
Implantable Pulse Generator Implant Date: 20211008

## 2020-11-25 MED ORDER — LANCETS MICRO THIN 33G MISC: 5 refills | Status: DC

## 2020-11-25 NOTE — Telephone Encounter (Signed)
Successful telephone encounter to [name] [name] to follow up on concern for bradycardia episodes and AF noted on loop recorder. Per with (on DPR) patient is unavailable at this time. Discuss with wife that transmissions were reviewed with Dr. Johney Frame who agrees loop recorder is undersensing and patient is truly not bradycardiac as suggested. Dr. Johney Frame recommends discussing with industry for potential reprogramming sensitivity of device. Patient's wife provided reassurance and very appreciative of call. Will update patient/wife once industry has been consulted.

## 2020-11-26 LAB — CUP PACEART REMOTE DEVICE CHECK
Date Time Interrogation Session: 20220630000500
Implantable Pulse Generator Implant Date: 20211008

## 2020-12-07 ENCOUNTER — Other Ambulatory Visit: Payer: Self-pay | Admitting: Family Medicine

## 2020-12-07 NOTE — Telephone Encounter (Signed)
Hi I have the pt on the line following up.. please advise.

## 2020-12-08 NOTE — Progress Notes (Signed)
[name] Reich T. Noha Milberger, MD, [place] at [facility] [place], [address]  Phone: [phone]  FAX: [phone]  Warnell Rasnic - 60 y.o. male  MRN [id number]  Date of Birth: 1960/09/24  Date: 12/09/2020  PCP: Owens Loffler, MD  Referral: Owens Loffler, MD  Chief Complaint  Patient presents with   Follow-up    For long term disability-Needed an appointment by August    This visit occurred during the SARS-CoV-2 public health emergency.  Safety protocols were in place, including screening questions prior to the visit, additional usage of staff PPE, and extensive cleaning of exam room while observing appropriate contact time as indicated for disinfecting solutions.   Subjective:   [name] [name] is a 60 y.o. very pleasant male patient who presents with the following:  [name] [name] is here to follow-up about a number of his general health care issues, particularly in light of his stroke that he had last year.  He does have some ongoing deficits after this stroke.  He is being followed by Dr. Leonie Man from [facility].  He continues to have a lot of problems and a lot of mental processing issues as well as aphasia.  Long-term disability through work is still needed and SS disability is in the works.  Still has a thought block and aphasia  He has done a very good job losing weight, dramatically down from 400 pounds.  Approximately 140 pounds  He has had a couple of falls.  Gait is off more now.  This is all been present since his stroke.  Concern about brady and a fib Will have a fib during sleep  - 2 weeks ago, when looking at strips. Abnormal - when asleep - questions about A Fib Wife is an Therapist, sports and they have worry about A Fib without symptoms and ongoing bradycardia  CONTACT THE A FIB CLINIC  - message [name] [name]  He had also been very heavy smoker in the past.  He has cut way down.  Diabetes  Mellitus: Tolerating Medications: yes Compliance with diet: fair, Body mass index is 40.39 kg/m. Exercise: minimal / intermittent Avg blood sugars at home: not checking Foot problems: none Hypoglycemia: none No nausea, vomitting, blurred vision, polyuria.  Lab Results  Component Value Date   HGBA1C 6.0 (A) 11/24/2020   HGBA1C 5.7 (A) 08/11/2020   HGBA1C 5.8 (H) 06/17/2020   Lab Results  Component Value Date   MICROALBUR 2.82 (H) 07/27/2007   LDLCALC 36 06/17/2020   CREATININE 1.17 10/15/2020    Wt Readings from Last 3 Encounters:  12/09/20 281 lb 8 oz (127.7 kg)  11/24/20 279 lb 12.8 oz (126.9 kg)  10/15/20 280 lb 6.4 oz (127.2 kg)    HTN: Tolerating all medications without side effects Stable and at goal No CP, no sob. No HA.  BP Readings from Last 3 Encounters:  12/09/20 118/80  11/24/20 132/88  10/15/20 (!) 749/44    Basic Metabolic Panel:    Component Value Date/Time   NA 139 10/15/2020 1100   NA 141 06/17/2020 1030   K 4.1 10/15/2020 1100   CL 104 10/15/2020 1100   CO2 26 10/15/2020 1100   BUN 17 10/15/2020 1100   BUN 17 06/17/2020 1030   CREATININE 1.17 10/15/2020 1100   GLUCOSE 83 10/15/2020 1100   CALCIUM 9.4 10/15/2020 1100    Lipids: Doing well, stable. Tolerating meds fine with no SE. Panel reviewed with patient.  Lipids:  Lab Results  Component Value Date   CHOL 96 (L) 06/17/2020   Lab Results  Component Value Date   HDL 30 (L) 06/17/2020   Lab Results  Component Value Date   LDLCALC 36 06/17/2020   Lab Results  Component Value Date   TRIG 186 (H) 06/17/2020   Lab Results  Component Value Date   CHOLHDL 3.2 06/17/2020    Lab Results  Component Value Date   ALT 15 06/17/2020   AST 13 06/17/2020   ALKPHOS 57 06/17/2020   BILITOT 0.5 06/17/2020     Review of Systems is noted in the HPI, as appropriate  Objective:   BP 118/80   Pulse 66   Temp (!) 95.4 F (35.2 C) (Temporal)   Ht _0  (1.778 m)   Wt 281 lb 8 oz  (127.7 kg)   SpO2 96%   BMI 40.39 kg/m   GEN: WDWN, NAD, Non-toxic HEENT: Atraumatic, Normocephalic. Neck supple. No masses. CV: RRR, No M/G/R. No JVD. No thrill. No extra heart sounds. PULM: CTA B, no wheezes, crackles, rhonchi. No retractions. No resp. distress. No accessory muscle use. EXTR: No c/c/e NEURO Normal gait.  PSYCH: Normally interactive. Conversant.   Laboratory and Imaging Data: Results for orders placed or performed in visit on 62/83/15  Basic metabolic panel  Result Value Ref Range   Glucose 71 65 - 99 mg/dL   BUN 21 6 - 24 mg/dL   Creatinine, Ser 1.04 0.76 - 1.27 mg/dL   eGFR 83 >59 mL/min/1.73   BUN/Creatinine Ratio 20 9 - 20   Sodium 142 134 - 144 mmol/L   Potassium 4.4 3.5 - 5.2 mmol/L   Chloride 103 96 - 106 mmol/L   CO2 24 20 - 29 mmol/L   Calcium 9.9 8.7 - 10.2 mg/dL  CBC with Differential/Platelet  Result Value Ref Range   WBC 9.0 3.4 - 10.8 x10E3/uL   RBC 6.22 (H) 4.14 - 5.80 x10E6/uL   Hemoglobin 17.8 (H) 13.0 - 17.7 g/dL   Hematocrit 52.1 (H) 37.5 - 51.0 %   MCV 84 79 - 97 fL   MCH 28.6 26.6 - 33.0 pg   MCHC 34.2 31.5 - 35.7 g/dL   RDW 14.9 11.6 - 15.4 %   Platelets 207 150 - 450 x10E3/uL   Neutrophils 69 Not Estab. %   Lymphs 21 Not Estab. %   Monocytes 8 Not Estab. %   Eos 1 Not Estab. %   Basos 1 Not Estab. %   Neutrophils Absolute 6.2 1.4 - 7.0 x10E3/uL   Lymphocytes Absolute 1.9 0.7 - 3.1 x10E3/uL   Monocytes Absolute 0.7 0.1 - 0.9 x10E3/uL   EOS (ABSOLUTE) 0.1 0.0 - 0.4 x10E3/uL   Basophils Absolute 0.1 0.0 - 0.2 x10E3/uL   Immature Granulocytes 0 Not Estab. %   Immature Grans (Abs) 0.0 0.0 - 0.1 x10E3/uL  Hepatic function panel  Result Value Ref Range   Total Protein 7.1 6.0 - 8.5 g/dL   Albumin 4.6 3.8 - 4.9 g/dL   Bilirubin Total 0.4 0.0 - 1.2 mg/dL   Bilirubin, Direct 0.15 0.00 - 0.40 mg/dL   Alkaline Phosphatase 60 44 - 121 IU/L   AST 14 0 - 40 IU/L   ALT 17 0 - 44 IU/L  Lipid panel  Result Value Ref Range    Cholesterol, Total 117 100 - 199 mg/dL   Triglycerides 157 (H) 0 - 149 mg/dL   HDL 39 (L) >39 mg/dL   VLDL Cholesterol Cal  27 5 - 40 mg/dL   LDL Chol Calc (NIH) 51 0 - 99 mg/dL   Chol/HDL Ratio 3.0 0.0 - 5.0 ratio  Vitamin B12  Result Value Ref Range   Vitamin B-12 860 232 - 1,245 pg/mL  T3, free  Result Value Ref Range   T3, Free 3.3 2.0 - 4.4 pg/mL  T4, free  Result Value Ref Range   Free T4 1.19 0.82 - 1.77 ng/dL  TSH  Result Value Ref Range   TSH 1.040 0.450 - 4.500 uIU/mL     Assessment and Plan:     ICD-10-CM   1. Ischemic stroke ([place]), occipital  I63.9     2. Type 2 diabetes mellitus with vascular disease ([place])  E11.59     3. Morbid obesity ([place])  E66.01 Lipid panel    4. Hyperlipidemia LDL goal <70  E78.5     5. Essential hypertension  I10     6. TOBACCO ABUSE  F17.200     7. Other fatigue  R53.83 Vitamin B12    8. Paroxysmal atrial fibrillation ([facility])  I48.0 T3, free    T4, free    TSH    9. Encounter for long-term (current) use of medications  I45.809 Basic metabolic panel    CBC with Differential/Platelet    Hepatic function panel     Total encounter time: 30 minutes. This includes total time spent on the day of encounter.   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  This is very frustrating for him, and he is continues to have very high level of executive function.  Diabetes is stable.  He has lost 140 pounds.  I have applauded him. LDL is at goal.  Body mass index is 40.39 kg/m.  This is in the setting of him being well over 400 pounds previously.  Blood pressure is stable.  He has had some intermittent A. fib, and he does not have any symptoms when he is having them.  They have talked to the loop recorder device people, and they do have some additional questions.  He is also had some bradycardia.  I am going to reach out to the A. fib clinic to see if they can help.  He has had a lot of very recent strokes with  significant impairment.  Question if they would add anything additional.  I will also check a thyroid panel.  He is on antiplatelet medication, Eliquis.  Orders Placed This Encounter  Procedures   Basic metabolic panel   CBC with Differential/Platelet   Hepatic function panel   Lipid panel   Vitamin B12   T3, free   T4, free   TSH    Follow-up: Return in about 6 months (around 06/11/2021).  Signed,  Maud Deed. Nitisha Civello, MD   Outpatient Encounter Medications as of 12/09/2020  Medication Sig   acetaminophen (TYLENOL) 500 MG tablet Take 1,000 mg by mouth 2 (two) times daily as needed (for pain).   apixaban (ELIQUIS) 5 MG TABS tablet Take 1 tablet (5 mg total) by mouth 2 (two) times daily.   bromocriptine (PARLODEL) 2.5 MG tablet Take 0.5 tablets (1.25 mg total) by mouth at bedtime.   dapagliflozin propanediol (FARXIGA) 10 MG TABS tablet TAKE 1 TABLET BY MOUTH DAILY BEFORE BREAKFAST   EPINEPHrine 0.3 mg/0.3 mL IJ SOAJ injection ADMINISTER 0.3 ML IN THE MUSCLE 1 TIME FOR 1 DOSE   glipiZIDE (GLUCOTROL) 5 MG tablet Take 0.5 tablets (2.5 mg total) by mouth  daily before breakfast.   glucose blood (CONTOUR NEXT TEST) test strip USE TO CHECK BLOOD SUGAR   Lancets Micro Thin 33G MISC Use to check blood sugar two times daily.   lisinopril (ZESTRIL) 40 MG tablet Take 1 tablet (40 mg total) by mouth daily.   metFORMIN (GLUCOPHAGE-XR) 500 MG 24 hr tablet Take 4 tablets by mouth daily with breakfast   Multiple Vitamin (MULTIVITAMIN) tablet Take 1 tablet by mouth daily.   pantoprazole (PROTONIX) 40 MG tablet Take 1 tablet (40 mg total) by mouth daily.   PARoxetine (PAXIL) 20 MG tablet TAKE 3 TABLETS(60 MG) BY MOUTH DAILY   pentoxifylline (TRENTAL) 400 MG CR tablet TAKE 1 TABLET BY MOUTH 3  TIMES DAILY WITH MEALS   pregabalin (LYRICA) 75 MG capsule TAKE 1 CAPSULE(75 MG) BY MOUTH THREE TIMES DAILY   rOPINIRole (REQUIP) 0.5 MG tablet Take 1 tablet (0.5 mg total) by mouth 2 (two) times daily.    rosuvastatin (CRESTOR) 20 MG tablet TAKE 1 TABLET(20 MG) BY MOUTH AT BEDTIME   sildenafil (VIAGRA) 100 MG tablet Take 1 tablet (100 mg total) by mouth daily as needed for erectile dysfunction.   TRULICITY 4.5 DA/3.7CK SOPN INJECT 4.5 MG INTO THE SKIN ONCE A WEEK   varenicline (CHANTIX STARTING MONTH PAK) 0.5 MG X 11 & 1 MG X 42 tablet Take one 0.44m tablet by mouth once daily for 3 days, then increase to one 0.5729mtablet twice daily for 3 days, then increase to one 29m19mablet twice daily.   varenicline (CHANTIX) 1 MG tablet Take 1 tablet (1 mg total) by mouth 2 (two) times daily.   No facility-administered encounter medications on file as of 12/09/2020.

## 2020-12-09 ENCOUNTER — Ambulatory Visit (INDEPENDENT_AMBULATORY_CARE_PROVIDER_SITE_OTHER): Payer: 59 | Admitting: Family Medicine

## 2020-12-09 ENCOUNTER — Other Ambulatory Visit: Payer: Self-pay

## 2020-12-09 ENCOUNTER — Telehealth: Payer: Self-pay

## 2020-12-09 ENCOUNTER — Encounter: Payer: Self-pay | Admitting: Family Medicine

## 2020-12-09 VITALS — BP 118/80 | HR 66 | Temp 95.4°F | Ht 70.0 in | Wt 281.5 lb

## 2020-12-09 DIAGNOSIS — F17200 Nicotine dependence, unspecified, uncomplicated: Secondary | ICD-10-CM

## 2020-12-09 DIAGNOSIS — E1159 Type 2 diabetes mellitus with other circulatory complications: Secondary | ICD-10-CM | POA: Diagnosis not present

## 2020-12-09 DIAGNOSIS — E785 Hyperlipidemia, unspecified: Secondary | ICD-10-CM | POA: Diagnosis not present

## 2020-12-09 DIAGNOSIS — I480 Paroxysmal atrial fibrillation: Secondary | ICD-10-CM

## 2020-12-09 DIAGNOSIS — I10 Essential (primary) hypertension: Secondary | ICD-10-CM

## 2020-12-09 DIAGNOSIS — R5383 Other fatigue: Secondary | ICD-10-CM

## 2020-12-09 DIAGNOSIS — I639 Cerebral infarction, unspecified: Secondary | ICD-10-CM | POA: Diagnosis not present

## 2020-12-09 DIAGNOSIS — Z79899 Other long term (current) drug therapy: Secondary | ICD-10-CM

## 2020-12-09 NOTE — Telephone Encounter (Signed)
The patient wife [name] asked to speak with Portia, rn.

## 2020-12-09 NOTE — Telephone Encounter (Signed)
Incoming telephone call from patient and wife to enquire about additional bradycardia episodes identified by loop recorder. Informed patient and wife that no additional episodes have been identified since 11/19/20. Discussed opportunity to bring patient in to increase sensitivity as brady event was a result of undersensing R waves. Patient and wife wish to continue monitoring for now. Will reconsider reprogramming of device if undersensing reoccurs.

## 2020-12-10 LAB — CBC WITH DIFFERENTIAL/PLATELET
Basophils Absolute: 0.1 10*3/uL (ref 0.0–0.2)
Basos: 1 %
EOS (ABSOLUTE): 0.1 10*3/uL (ref 0.0–0.4)
Eos: 1 %
Hematocrit: 52.1 % — ABNORMAL HIGH (ref 37.5–51.0)
Hemoglobin: 17.8 g/dL — ABNORMAL HIGH (ref 13.0–17.7)
Immature Grans (Abs): 0 10*3/uL (ref 0.0–0.1)
Immature Granulocytes: 0 %
Lymphocytes Absolute: 1.9 10*3/uL (ref 0.7–3.1)
Lymphs: 21 %
MCH: 28.6 pg (ref 26.6–33.0)
MCHC: 34.2 g/dL (ref 31.5–35.7)
MCV: 84 fL (ref 79–97)
Monocytes Absolute: 0.7 10*3/uL (ref 0.1–0.9)
Monocytes: 8 %
Neutrophils Absolute: 6.2 10*3/uL (ref 1.4–7.0)
Neutrophils: 69 %
Platelets: 207 10*3/uL (ref 150–450)
RBC: 6.22 x10E6/uL — ABNORMAL HIGH (ref 4.14–5.80)
RDW: 14.9 % (ref 11.6–15.4)
WBC: 9 10*3/uL (ref 3.4–10.8)

## 2020-12-10 LAB — BASIC METABOLIC PANEL
BUN/Creatinine Ratio: 20 (ref 9–20)
BUN: 21 mg/dL (ref 6–24)
CO2: 24 mmol/L (ref 20–29)
Calcium: 9.9 mg/dL (ref 8.7–10.2)
Chloride: 103 mmol/L (ref 96–106)
Creatinine, Ser: 1.04 mg/dL (ref 0.76–1.27)
Glucose: 71 mg/dL (ref 65–99)
Potassium: 4.4 mmol/L (ref 3.5–5.2)
Sodium: 142 mmol/L (ref 134–144)
eGFR: 83 mL/min/{1.73_m2} (ref >59)

## 2020-12-10 LAB — LIPID PANEL
Chol/HDL Ratio: 3 ratio (ref 0.0–5.0)
Cholesterol, Total: 117 mg/dL (ref 100–199)
HDL: 39 mg/dL — ABNORMAL LOW (ref >39)
LDL Chol Calc (NIH): 51 mg/dL (ref 0–99)
Triglycerides: 157 mg/dL — ABNORMAL HIGH (ref 0–149)
VLDL Cholesterol Cal: 27 mg/dL (ref 5–40)

## 2020-12-10 LAB — TSH: TSH: 1.04 u[IU]/mL (ref 0.450–4.500)

## 2020-12-10 LAB — HEPATIC FUNCTION PANEL
ALT: 17 IU/L (ref 0–44)
AST: 14 IU/L (ref 0–40)
Albumin: 4.6 g/dL (ref 3.8–4.9)
Alkaline Phosphatase: 60 IU/L (ref 44–121)
Bilirubin Total: 0.4 mg/dL (ref 0.0–1.2)
Bilirubin, Direct: 0.15 mg/dL (ref 0.00–0.40)
Total Protein: 7.1 g/dL (ref 6.0–8.5)

## 2020-12-10 LAB — VITAMIN B12: Vitamin B-12: 860 pg/mL (ref 232–1245)

## 2020-12-10 LAB — T4, FREE: Free T4: 1.19 ng/dL (ref 0.82–1.77)

## 2020-12-10 LAB — T3, FREE: T3, Free: 3.3 pg/mL (ref 2.0–4.4)

## 2020-12-14 ENCOUNTER — Encounter: Payer: Self-pay | Admitting: Endocrinology

## 2020-12-14 ENCOUNTER — Other Ambulatory Visit: Payer: Self-pay | Admitting: Endocrinology

## 2020-12-14 DIAGNOSIS — L97929 Non-pressure chronic ulcer of unspecified part of left lower leg with unspecified severity: Secondary | ICD-10-CM

## 2020-12-14 DIAGNOSIS — E11622 Type 2 diabetes mellitus with other skin ulcer: Secondary | ICD-10-CM

## 2020-12-14 MED ORDER — DAPAGLIFLOZIN PROPANEDIOL 10 MG PO TABS: 10.0000 mg | ORAL_TABLET | Freq: Every day | ORAL | 3 refills | Status: DC

## 2020-12-14 NOTE — Telephone Encounter (Signed)
Is this patient suppose to be on this medication ,please advise

## 2020-12-15 ENCOUNTER — Telehealth: Payer: Self-pay

## 2020-12-15 NOTE — Telephone Encounter (Signed)
-----   Message from Newman Nip, NP sent at 12/15/2020  8:47 AM EDT ----- Regarding: FW: A Fib / brady question Could you reach out to wife to see if she has any other questions re his Linq reports?  Thanks, Rudi Coco  ----- Message ----- From: Hannah Beat, MD Sent: 12/11/2020   7:07 PM EDT To: Newman Nip, NP Subject: A Fib / brady question                         Lupita Leash,   I wanted to see is the A Fib clinic could reach out to this patient.  He is having some A. Fib with bradycardia without symptoms. He is on anti-platelet medication, but he has had a lot of very recent strokes.   I wondered if you guys would do anything more in his case?  He wife is a very experienced RN, and she also wanted more information.  Thank-you so much.  (If I should have asked someone else, would you forward to them?)  Karleen Hampshire

## 2020-12-15 NOTE — Progress Notes (Signed)
[facility] Summary Report / Loop Recorder 

## 2020-12-15 NOTE — Telephone Encounter (Signed)
Called patient and states [name] [name] has clarified all their questions. Appreciative of call.

## 2020-12-23 ENCOUNTER — Encounter: Payer: Self-pay | Admitting: Neurology

## 2020-12-23 ENCOUNTER — Ambulatory Visit (INDEPENDENT_AMBULATORY_CARE_PROVIDER_SITE_OTHER): Payer: 59 | Admitting: Neurology

## 2020-12-23 VITALS — BP 142/94 | HR 60 | Ht 70.0 in | Wt 277.0 lb

## 2020-12-23 DIAGNOSIS — G3184 Mild cognitive impairment, so stated: Secondary | ICD-10-CM | POA: Diagnosis not present

## 2020-12-23 DIAGNOSIS — I480 Paroxysmal atrial fibrillation: Secondary | ICD-10-CM

## 2020-12-23 NOTE — Patient Instructions (Signed)
I had a long discussion with the patient and his wife regarding his recent diagnosis of paroxysmal A. fib and starting Eliquis which is tolerating well without bleeding side effects.  He also has mild cognitive impairment poststroke which appears to be stable.  I encouraged him to increase participation in cognitively challenging activities like solving crossword puzzles, playing bridge and sodoku.  We also discussed memory compensation strategies.  If he had further cognitive decline may consider trial of Aricept or Namenda in the future.  Continue aggressive risk factor modification with strict control of hypertension with blood pressure goal below 130/90, lipids with LDL cholesterol goal below 70 mg percent and diabetes with hemoglobin A1c goal below 6.5%.  Return for follow-up in the future in 6 months or call earlier if necessary. Memory Compensation Strategies  Use "WARM" strategy.  W= write it down  A= associate it  R= repeat it  M= make a mental note  2.   You can keep a Glass blower/designer.  Use a 3-ring notebook with sections for the following: calendar, important names and phone numbers,  medications, doctors' names/phone numbers, lists/reminders, and a section to journal what you did  each day.   3.    Use a calendar to write appointments down.  4.    Write yourself a schedule for the day.  This can be placed on the calendar or in a separate section of the Memory Notebook.  Keeping a  regular schedule can help memory.  5.    Use medication organizer with sections for each day or morning/evening pills.  You may need help loading it  6.    Keep a basket, or pegboard by the door.  Place items that you need to take out with you in the basket or on the pegboard.  You may also want to  include a message board for reminders.  7.    Use sticky notes.  Place sticky notes with reminders in a place where the task is performed.  For example: " turn off the  stove" placed by the stove, "lock the  door" placed on the door at eye level, " take your medications" on  the bathroom mirror or by the place where you normally take your medications.  8.    Use alarms/timers.  Use while cooking to remind yourself to check on food or as a reminder to take your medicine, or as a  reminder to make a call, or as a reminder to perform another task, etc.

## 2020-12-23 NOTE — Progress Notes (Signed)
[facility] [address] [place]. [place] [address] [phone]       OFFICE CONSULT NOTE  [name] Date of Birth:  March 02, 1961 Medical Record Number:  [id number]   Referring MD: [name] [name] Reason for Referral: Stroke  HPI: [name] [name] is a 60 year old Caucasian male seen today for initial office consultation visit for stroke.  He is accompanied by his wife.  History is obtained from them and review of electronic medical records and I personally reviewed imaging films in PACS.60 y.o. male with medical history significant of hypertension, hyperlipidemia, type 2 diabetes mellitus, restless leg syndrome, diabetic neuropathy, morbid obesity, tobacco abuse, depression/anxiety, GERD, chronic venous stasis ulcer in bilateral lower extremities presented to emergency department with headache and right thigh vision loss started 2 AM on the morning of admission on 02/14/2020.  He initially went to his ophthalmologist and was referred to the emergency department.  Concern was for acute stroke.  Patient was hospitalized for further management.  CT perfusion scan suggested a left occipital lobe infarct.  MRI was attempted but patient refused due to significant claustrophobia.  Subsequent repeat CT scan confirmed left posterior division MCA infarct.  CT angiogram showed no significant large vessel stenosis or occlusion.  Lower extremity venous Dopplers were negative for DVT.  Transthoracic echo showed normal ejection fraction without cardiac source of embolism.  LDL cholesterol was quite low at 15 mg percent and hemoglobin A1c was elevated 8.4.  Patient was started on aspirin and Plavix for 3 weeks followed by aspirin alone.  Patient states is done well since discharge she is now beginning to see some movement in the right peripheral field of vision but is still not able to count fingers.  He still has trouble reading and comprehending.  His speech has improved though it is still  slightly hesitant.  Is tolerating aspirin well without bruising or bleeding.  Patient is unable to return to work in [employer] and is currently on short-term disability.  He has not been driving.  He denies any prior history of atrial fibrillation, syncope or palpitations.  He had outpatient loop recorder placed by [name] [name] and so for paroxysmal A. fib has not yet been found.  He also complains of mild short-term memory and cognitive difficulties which are new since his stroke. Update 07/07/2020: He returns for follow-up after last visit 3 months ago.  Is accompanied by his wife.  Patient has noted improvement in his speech peripheral vision but thinks continues to have occasional word finding difficulties and has to speak in a slow.  He notices that if he tries to talk too fast or get excited he has more trouble.  He noticed slight improvement in his right-sided peripheral vision particularly in the quadrant though he still has significant vision loss in the lower quadrant and has not been driving.  He was recently seen by optometry wrist who asked him not to drive.  Patient had loop recorder inserted and so for paroxysmal A. fib has not yet been found.  He did participate in the [place] stroke trial and qualified and is on the trial medication.  Today he signed consent to be in the cognitive soft study of the trial.  He is tolerating the trial medications well without bleeding or bruising.  His blood pressure is well controlled and today it is borderline at 140/85.  He states his sugars are doing quite good and last A1c checked in January this year was 5.8.  His lipids are  satisfactory.  He has been overall eating a healthy diet and is quite controlled on what he eats and is overall lost 130 pounds over the last 1 year.  He is finished outpatient speech therapy but is enrolled to participate in [employer] summer program for speech and language.His Paxil dose was recently increased by his primary care physician to help  with his underlying anxiety and stress.  He does have diabetic peripheral neuropathy but Lyrica seems to help his paresthesias quite well and is able to sleep well at night.  He works as an Product/process development scientist in [employer] but clearly due to his cognitive and speech difficulties he will not be able to work and may have to go on long-term disability.   Update 12/23/2020: He returns for follow-up after last visit with me 5 months ago.  Is accompanied by his wife.  Patient's wife called me in March 2022 stating patient had been complaining of some new headaches for the last couple of weeks and on a couple of occasions  she noticed that he was found staring into the distance as if he was not there but however when she called out his name he responded right away.  Denied seeing any twitching some is lips or hands or any involuntary seizure-like movements .he had an outpatient MRI scan done on 07/30/2020 which showed small acute to subacute infarcts involving the right thalamocapsular junction, left frontal and bilateral parietal and left temporal regions with old chronic left PCA infarct and chronic right occipital and cerebellar and thalamic infarcts as well.  He had outpatient EEG on 09/01/2022 which was slightly abnormal showing mild slowing but no epileptiform activity.  Patient was in the [place] trial and given the strokes he was asked to discontinue the study medication and was started on Eliquis after he was found to have paroxysmal A. fib on the loop recorder on 09/05/2020.  Patient is tolerating Eliquis well with only minor bruising and no bleeding.  Continues to have short-term memory difficulties and mild word finding difficulties as well.  He has trouble with multitasking as well.  He recently had neuropsych testing which she had a tough time completing however I do not have the results to review today.  He is doing some crossword puzzles but not doing many mentally challenging activities at present.  Is also  not been physically very active.  He uses a cane to walk he has had no falls or injuries.  He has had no further recurrent stroke or TIA symptoms. ROS:   14 system review of systems is positive for vision loss, memory loss, speech and word finding difficulties difficulty with reading and comprehension, anxiety all other systems negative PMH:  Past Medical History:  Diagnosis Date   Angio-edema    Anxiety    Depression    Diabetes mellitus type II    type 2   GERD (gastroesophageal reflux disease)    Headache    migraine   History of kidney stones    HLD (hyperlipidemia)    HTN (hypertension)    Morbid obesity (HCC)    Restless legs syndrome (RLS) 11/20/2012   Stroke (HCC) 02/14/2020   ischemic/right sided affected   Tobacco abuse    Ulcer of left ankle (HCC)    last 2 months dime size dry dressing changing q day   Urticaria     Social History:  Social History   Socioeconomic History   Marital status: Married    Spouse name: Cala Bradford  Swatek   Number of children: 3   Years of education: Not on file   Highest education level: Not on file  Occupational History   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    Comment: no longer working 07/07/20  Tobacco Use   Smoking status: Some Days    Packs/day: 0.25    Years: 40.00    Pack years: 10.00    Types: Cigarettes   Smokeless tobacco: Never   Tobacco comments:    half pack daily  Substance and Sexual Activity   Alcohol use: Not Currently    Alcohol/week: 0.0 standard drinks    Comment: rarely   Drug use: No   Sexual activity: Yes    Partners: Female  Other Topics Concern   Not on file  Social History Narrative   Lives with wife   Right Handed   Drink 6-7 cups caffeine daily   Social Determinants of Health   Financial Resource Strain: Not on file  Food Insecurity: Not on file  Transportation Needs: Not on file  Physical Activity: Not on file  Stress: Not on file  Social Connections: Not on file  Intimate  Partner Violence: Not on file    Medications:   Current Outpatient Medications on File Prior to Visit  Medication Sig Dispense Refill   acetaminophen (TYLENOL) 500 MG tablet Take 1,000 mg by mouth 2 (two) times daily as needed (for pain).     apixaban (ELIQUIS) 5 MG TABS tablet Take 1 tablet (5 mg total) by mouth 2 (two) times daily. 60 tablet 3   bromocriptine (PARLODEL) 2.5 MG tablet Take 0.5 tablets (1.25 mg total) by mouth at bedtime. 45 tablet 3   dapagliflozin propanediol (FARXIGA) 10 MG TABS tablet Take 1 tablet (10 mg total) by mouth daily. 90 tablet 3   EPINEPHrine 0.3 mg/0.3 mL IJ SOAJ injection ADMINISTER 0.3 ML IN THE MUSCLE 1 TIME FOR 1 DOSE 2 each 0   glipiZIDE (GLUCOTROL) 5 MG tablet Take 0.5 tablets (2.5 mg total) by mouth daily before breakfast. 45 tablet 3   glucose blood (CONTOUR NEXT TEST) test strip USE TO CHECK BLOOD SUGAR 100 strip 5   Lancets Micro Thin 33G MISC Use to check blood sugar two times daily. 100 each 5   lisinopril (ZESTRIL) 40 MG tablet Take 1 tablet (40 mg total) by mouth daily. 90 tablet 1   metFORMIN (GLUCOPHAGE-XR) 500 MG 24 hr tablet Take 4 tablets by mouth daily with breakfast 360 tablet 1   Multiple Vitamin (MULTIVITAMIN) tablet Take 1 tablet by mouth daily.     pantoprazole (PROTONIX) 40 MG tablet Take 1 tablet (40 mg total) by mouth daily. 90 tablet 3   PARoxetine (PAXIL) 20 MG tablet TAKE 3 TABLETS(60 MG) BY MOUTH DAILY 270 tablet 1   pentoxifylline (TRENTAL) 400 MG CR tablet TAKE 1 TABLET BY MOUTH 3  TIMES DAILY WITH MEALS 270 tablet 3   pregabalin (LYRICA) 75 MG capsule TAKE 1 CAPSULE(75 MG) BY MOUTH THREE TIMES DAILY 270 capsule 1   rOPINIRole (REQUIP) 0.5 MG tablet Take 1 tablet (0.5 mg total) by mouth 2 (two) times daily. 180 tablet 3   rosuvastatin (CRESTOR) 20 MG tablet TAKE 1 TABLET(20 MG) BY MOUTH AT BEDTIME 90 tablet 3   sildenafil (VIAGRA) 100 MG tablet Take 1 tablet (100 mg total) by mouth daily as needed for erectile dysfunction. 10  tablet 5   TRULICITY 4.5 MG/0.5ML SOPN INJECT 4.5 MG INTO THE SKIN ONCE A WEEK 6 mL  3   varenicline (CHANTIX STARTING MONTH PAK) 0.5 MG X 11 & 1 MG X 42 tablet Take one 0.5mg  tablet by mouth once daily for 3 days, then increase to one 0.5mg  tablet twice daily for 3 days, then increase to one 1mg  tablet twice daily. 53 tablet 0   varenicline (CHANTIX) 1 MG tablet Take 1 tablet (1 mg total) by mouth 2 (two) times daily. 60 tablet 3   No current facility-administered medications on file prior to visit.    Allergies:   Allergies  Allergen Reactions   Cashew Nut Oil Anaphylaxis    Cashews, 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   Pistachio Nut (Diagnostic) Hives, Shortness Of Breath, Itching, Swelling and Rash   Latex Dermatitis   Tape Other (See Comments)    Tears and bruises the skin   Betadine [Povidone Iodine] Rash and Other (See Comments)    Raised rash 04/23/14   Lidocaine Other (See Comments)    Minimal effect with lidocaine, prefers different anesthetic   Penicillins Rash and Other (See Comments)    Rash all over as a child    Physical Exam General: Obese middle-aged Caucasian male seated, in no evident distress Head: head normocephalic and atraumatic.   Neck: supple with no carotid or supraclavicular bruits Cardiovascular: regular rate and rhythm, no murmurs Musculoskeletal: no deformity Skin:  no rash/petichiae Vascular:  Normal pulses all extremities  Neurologic Exam Mental Status: Awake and fully alert. Oriented to place and time. Recent and remote memory intact. Attention span, concentration and fund of knowledge appropriate. Mood and affect appropriate.  Speech is slightly nonfluent, deliberate and hesitant but no dysarthria or paraphasic errors.  Diminished recall 1/3.  Able to name 9 animals which can walk on 4 legs.  Clock drawing 4/4. Cranial Nerves: Fundoscopic exam not done. Pupils equal, briskly reactive to light. Extraocular movements full without nystagmus. Visual fields show  partial right homonymous hemianopsia to confrontation with more deficit in the inferior quadrant.04/25/14 Hearing intact. Facial sensation intact.  Mild right lower facial asymmetry.  Tongue, palate moves normally and symmetrically.  Motor: Normal bulk and tone. Normal strength in all tested extremity muscles. Sensory.: intact to touch , pinprick , position and vibratory sensation.  Coordination: Rapid alternating movements normal in all extremities. Finger-to-nose and heel-to-shin performed accurately bilaterally. Gait and Station: Arises from chair without difficulty. Stance is normal. Gait demonstrates normal stride length and balance . Able to heel, toe and tandem walk with great difficulty.  Reflexes: 1+ and symmetric. Toes downgoing.   NIHSS  3 Modified Rankin  2   ASSESSMENT: 60 year old Caucasian male with embolic left MCA branch infarct in September 2021 and bicerebral small infarcts in March 2022 of cryptogenic etiology.  But subsequently paroxysmal A. fib found on loop recorder on 09/05/2020. Vascular risk factors of diabetes, hypertension hyperlipidemia. PAF and obesity.  He also has persistent right-sided peripheral vision loss, mild expressive language difficulties mild post stroke cognitive impairment.     PLAN: I had a long discussion with the patient and his wife regarding his recent diagnosis of paroxysmal A. fib and starting Eliquis which is tolerating well without bleeding side effects.  He also has mild cognitive impairment poststroke which appears to be stable.  I encouraged him to increase participation in cognitively challenging activities like solving crossword puzzles, playing bridge and sodoku.  We also discussed memory compensation strategies.  If he had further cognitive decline may consider trial of Aricept or Namenda in the future.  Continue aggressive risk factor modification with  strict control of hypertension with blood pressure goal below 130/90, lipids with LDL cholesterol  goal below 70 mg percent and diabetes with hemoglobin A1c goal below 6.5%.  Return for follow-up in the future in 6 months or call earlier if necessary.Greater than 50% time during this prolonged 45-minute visit was spent on counseling and coordination of care about his cryptogenic stroke discussion about stroke prevention treatment and answering questions. Delia HeadyPramod Oriah Leinweber, MD Note: This document was prepared with digital dictation and possible smart phrase technology. Any transcriptional errors that result from this process are unintentional.

## 2020-12-28 ENCOUNTER — Ambulatory Visit (INDEPENDENT_AMBULATORY_CARE_PROVIDER_SITE_OTHER): Payer: 59

## 2020-12-28 DIAGNOSIS — I639 Cerebral infarction, unspecified: Secondary | ICD-10-CM | POA: Diagnosis not present

## 2020-12-29 LAB — CUP PACEART REMOTE DEVICE CHECK
Date Time Interrogation Session: 20220801115832
Implantable Pulse Generator Implant Date: 20211008

## 2021-01-03 ENCOUNTER — Other Ambulatory Visit (HOSPITAL_COMMUNITY): Payer: Self-pay | Admitting: Nurse Practitioner

## 2021-01-03 ENCOUNTER — Other Ambulatory Visit: Payer: Self-pay | Admitting: Family Medicine

## 2021-01-03 NOTE — Telephone Encounter (Signed)
Last office visit 12/09/20 for follow up for long term disability.  Last refilled 09/01/20 for #60 with 3 refills.  Next Appt: 03/29/21 for 6 month follow up.

## 2021-01-04 NOTE — Telephone Encounter (Signed)
Eliquis 5mg  refill request received. Patient is 60 years old, weight-125.6kg, Crea-1.04 on 12/09/2020, Diagnosis-Afib, and last seen by 12/11/2020 on 10/15/2020. Dose is appropriate based on dosing criteria. Will send in refill to requested pharmacy.

## 2021-01-13 DIAGNOSIS — Z0271 Encounter for disability determination: Secondary | ICD-10-CM

## 2021-01-20 NOTE — Progress Notes (Signed)
[facility] Summary Report / Loop Recorder 

## 2021-01-28 ENCOUNTER — Ambulatory Visit (INDEPENDENT_AMBULATORY_CARE_PROVIDER_SITE_OTHER): Payer: 59

## 2021-01-28 DIAGNOSIS — I639 Cerebral infarction, unspecified: Secondary | ICD-10-CM

## 2021-02-02 LAB — CUP PACEART REMOTE DEVICE CHECK
Date Time Interrogation Session: 20220903115903
Implantable Pulse Generator Implant Date: 20211008

## 2021-02-09 NOTE — Progress Notes (Signed)
[facility] Summary Report / Loop Recorder 

## 2021-02-15 ENCOUNTER — Telehealth: Payer: Self-pay | Admitting: Family Medicine

## 2021-02-15 MED ORDER — LISINOPRIL 40 MG PO TABS: 40.0000 mg | ORAL_TABLET | Freq: Every day | ORAL | 1 refills | Status: DC

## 2021-02-15 NOTE — Telephone Encounter (Signed)
Refills sent to [facility] as requested.  

## 2021-02-15 NOTE — Telephone Encounter (Signed)
Pt needs a refill on lisinopril (ZESTRIL) 40 MG tablet sent to [facility]

## 2021-02-18 ENCOUNTER — Telehealth: Payer: Self-pay | Admitting: Pharmacy Technician

## 2021-02-18 ENCOUNTER — Other Ambulatory Visit (HOSPITAL_COMMUNITY): Payer: Self-pay

## 2021-02-18 NOTE — Telephone Encounter (Signed)
Received notification from [employer] regarding a prior authorization for TRULICTY. Prior authorization not needed. REFILL TOO SOON.

## 2021-02-24 ENCOUNTER — Ambulatory Visit (INDEPENDENT_AMBULATORY_CARE_PROVIDER_SITE_OTHER): Payer: 59 | Admitting: Endocrinology

## 2021-02-24 ENCOUNTER — Other Ambulatory Visit: Payer: Self-pay

## 2021-02-24 VITALS — BP 136/70 | HR 84 | Ht 70.0 in | Wt 277.2 lb

## 2021-02-24 DIAGNOSIS — L97929 Non-pressure chronic ulcer of unspecified part of left lower leg with unspecified severity: Secondary | ICD-10-CM | POA: Diagnosis not present

## 2021-02-24 DIAGNOSIS — E1159 Type 2 diabetes mellitus with other circulatory complications: Secondary | ICD-10-CM

## 2021-02-24 DIAGNOSIS — E11622 Type 2 diabetes mellitus with other skin ulcer: Secondary | ICD-10-CM

## 2021-02-24 LAB — POCT GLYCOSYLATED HEMOGLOBIN (HGB A1C): Hemoglobin A1C: 5.9 % — AB (ref 4.0–5.6)

## 2021-02-24 NOTE — Progress Notes (Signed)
Subjective:    Patient ID: [name] [name], male    DOB: 06-05-1960, 60 y.o.   MRN: [id number]  HPI Pt returns for f/u of diabetes mellitus:  DM type: 2 Dx'ed: 2010 Complications: PN, CVA's, and foot ulcers.   Therapy: trulicity and 5 oral meds. DKA: never Severe hypoglycemia: once, in 2016 Pancreatitis: never Pancreatic imaging: never SDOH: wife provides most of hx, due to pt's difficulty with concentration.   Other: he has never been on insulin; wife is nurse, and pt has also administered insulin to his father; edema limits rx options; he did not tolerate welchol (constipation) Interval history: no cbg record, but states cbg varies from 70-100's.  Pt is here with wife today. He stopped bromocriptine, due to ED sxs.   Past Medical History:  Diagnosis Date   Angio-edema    Anxiety    Depression    Diabetes mellitus type II    type 2   GERD (gastroesophageal reflux disease)    Headache    migraine   History of kidney stones    HLD (hyperlipidemia)    HTN (hypertension)    Morbid obesity (HCC)    Restless legs syndrome (RLS) 11/20/2012   Stroke (HCC) 02/14/2020   ischemic/right sided affected   Tobacco abuse    Ulcer of left ankle (HCC)    last 2 months dime size dry dressing changing q day   Urticaria     Past Surgical History:  Procedure Laterality Date   Arm surgery Right 1969   Abscess excision and debridement at elbow   COLONOSCOPY WITH PROPOFOL N/A 09/06/2016   Procedure: COLONOSCOPY WITH PROPOFOL;  Surgeon: Sherrilyn Rist, MD;  Location: [facility];  Service: Gastroenterology;  Laterality: N/A;   I & D EXTREMITY Left 05/29/2014   Procedure: IRRIGATION AND DEBRIDEMENT OF LEFT LEG WOUND AND PLACEMENT OF  INTEGRA  AND  VAC;  Surgeon: Wayland Denis, DO;  Location: [facility];  Service: Plastics;  Laterality: Left;   implantable loop recorder implant  03/06/2020   Medtronic Reveal [place] model [id number] (SN  [id number]) implanted by Dr Johney Frame for  cryptogenic stroke   INCISION AND DRAINAGE OF WOUND Left 04/23/2014   Procedure: IRRIGATION AND DEBRIDEMENT LOWER LEFT LEG WOUND WITH PLACEMENT OF INTEGRA AND VAC;  Surgeon: Wayland Denis, DO;  Location: [facility];  Service: Plastics;  Laterality: Left;   kidney stone removal  2004   MINOR APPLICATION OF WOUND VAC Left 04/23/2014   Procedure: MINOR APPLICATION OF WOUND VAC;  Surgeon: Wayland Denis, DO;  Location: [facility];  Service: Plastics;  Laterality: Left;   SKIN SPLIT GRAFT Left 06/18/2014   Procedure: SKIN GRAFT SPLIT THICKNESS TO LOWER LEFT LEG WOUND WITH PLACEMENT OF VAC;  Surgeon: Wayland Denis, DO;  Location:  SURGERY CENTER;  Service: Plastics;  Laterality: Left;   VASECTOMY  1994    Social History   Socioeconomic History   Marital status: Married    Spouse name: Banker   Number of children: 3   Years of education: Not on file   Highest education level: Not on file  Occupational History   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    Comment: no longer working 07/07/20  Tobacco Use   Smoking status: Some Days    Packs/day: 0.25    Years: 40.00    Pack years: 10.00    Types: Cigarettes   Smokeless tobacco: Never   Tobacco comments:    half  pack daily  Substance and Sexual Activity   Alcohol use: Not Currently    Alcohol/week: 0.0 standard drinks    Comment: rarely   Drug use: No   Sexual activity: Yes    Partners: Female  Other Topics Concern   Not on file  Social History Narrative   Lives with wife   Right Handed   Drink 6-7 cups caffeine daily   Social Determinants of Health   Financial Resource Strain: Not on file  Food Insecurity: Not on file  Transportation Needs: Not on file  Physical Activity: Not on file  Stress: Not on file  Social Connections: Not on file  Intimate Partner Violence: Not on file    Current Outpatient Medications on File Prior to Visit  Medication Sig Dispense Refill    acetaminophen (TYLENOL) 500 MG tablet Take 1,000 mg by mouth 2 (two) times daily as needed (for pain).     apixaban (ELIQUIS) 5 MG TABS tablet TAKE 1 TABLET(5 MG) BY MOUTH TWICE DAILY 60 tablet 5   APO-VARENICLINE 1 MG tablet TAKE 1 TABLET(1 MG) BY MOUTH TWICE DAILY 60 tablet 3   dapagliflozin propanediol (FARXIGA) 10 MG TABS tablet Take 1 tablet (10 mg total) by mouth daily. 90 tablet 3   EPINEPHrine 0.3 mg/0.3 mL IJ SOAJ injection ADMINISTER 0.3 ML IN THE MUSCLE 1 TIME FOR 1 DOSE 2 each 0   glucose blood (CONTOUR NEXT TEST) test strip USE TO CHECK BLOOD SUGAR 100 strip 5   Lancets Micro Thin 33G MISC Use to check blood sugar two times daily. 100 each 5   lisinopril (ZESTRIL) 40 MG tablet Take 1 tablet (40 mg total) by mouth daily. 90 tablet 1   metFORMIN (GLUCOPHAGE-XR) 500 MG 24 hr tablet Take 4 tablets by mouth daily with breakfast 360 tablet 1   Multiple Vitamin (MULTIVITAMIN) tablet Take 1 tablet by mouth daily.     pantoprazole (PROTONIX) 40 MG tablet Take 1 tablet (40 mg total) by mouth daily. 90 tablet 3   PARoxetine (PAXIL) 20 MG tablet TAKE 3 TABLETS(60 MG) BY MOUTH DAILY 270 tablet 1   pentoxifylline (TRENTAL) 400 MG CR tablet TAKE 1 TABLET BY MOUTH 3  TIMES DAILY WITH MEALS 270 tablet 3   pregabalin (LYRICA) 75 MG capsule TAKE 1 CAPSULE(75 MG) BY MOUTH THREE TIMES DAILY 270 capsule 1   rOPINIRole (REQUIP) 0.5 MG tablet Take 1 tablet (0.5 mg total) by mouth 2 (two) times daily. 180 tablet 3   rosuvastatin (CRESTOR) 20 MG tablet TAKE 1 TABLET(20 MG) BY MOUTH AT BEDTIME 90 tablet 3   TRULICITY 4.5 MG/0.5ML SOPN INJECT 4.5 MG INTO THE SKIN ONCE A WEEK 6 mL 3   varenicline (CHANTIX STARTING MONTH PAK) 0.5 MG X 11 & 1 MG X 42 tablet Take one 0.5mg  tablet by mouth once daily for 3 days, then increase to one 0.5mg  tablet twice daily for 3 days, then increase to one 1mg  tablet twice daily. 53 tablet 0   sildenafil (VIAGRA) 100 MG tablet Take 1 tablet (100 mg total) by mouth daily as needed for  erectile dysfunction. 10 tablet 5   No current facility-administered medications on file prior to visit.    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   Pistachio Nut (Diagnostic) Hives, Shortness Of Breath, Itching, Swelling and Rash   Latex Dermatitis   Tape Other (See Comments)    Tears and bruises the skin   Betadine [  Povidone Iodine] Rash and Other (See Comments)    Raised rash 04/23/14   Lidocaine Other (See Comments)    Minimal effect with lidocaine, prefers different anesthetic   Penicillins Rash and Other (See Comments)    Rash all over as a child    Family History  Problem Relation Age of Onset   Stroke Mother        multiple   Leukemia Mother    Pneumonia Mother    Cancer Mother    Diabetes Mother    Diabetes Father    Heart disease Father    Hyperlipidemia Father    Hypertension Father    Allergic rhinitis Neg Hx    Angioedema Neg Hx    Asthma Neg Hx    Eczema Neg Hx    Immunodeficiency Neg Hx    Urticaria Neg Hx     BP 136/70 (BP Location: Right Arm, Patient Position: Sitting, Cuff Size: Large)   Pulse 84   Ht 5\' 10"  (1.778 m)   Wt 277 lb 3.2 oz (125.7 kg)   SpO2 96%   BMI 39.77 kg/m    Review of Systems He denies hypoglycemia    Objective:   Physical Exam Pulses: dorsalis pedis intact bilat.   MSK: no deformity of the feet CV: 2+ bilat leg edema, and bilat vv's.  Skin: healed ulcers on the feet and legs.  normal temp on the feet, but there is (chronic) erythema of the feet.  Neuro: sensation is intact to touch on the feet, but severely decreased from normal.     A1c=5.9%    Assessment & Plan:  Type 2 DM: overcontrolled  Patient Instructions  Please stop the glipizide, and continue the same other diabetes medications.    check your blood sugar once a day.  vary the time of day when you check, between before the 3 meals, and at bedtime.  also check if you have symptoms of your blood  sugar being too high or too low.  please keep a record of the readings and bring it to your next appointment here (or you can bring the meter itself).  You can write it on any piece of paper.  please call sooner if your blood sugar goes below 70, or if you have a lot of readings over 200.   Please come back for a follow-up appointment in 4 months.

## 2021-02-24 NOTE — Patient Instructions (Addendum)
Please stop the glipizide, and continue the same other diabetes medications.    check your blood sugar once a day.  vary the time of day when you check, between before the 3 meals, and at bedtime.  also check if you have symptoms of your blood sugar being too high or too low.  please keep a record of the readings and bring it to your next appointment here (or you can bring the meter itself).  You can write it on any piece of paper.  please call us sooner if your blood sugar goes below 70, or if you have a lot of readings over 200.   Please come back for a follow-up appointment in 4 months.

## 2021-03-01 ENCOUNTER — Ambulatory Visit (INDEPENDENT_AMBULATORY_CARE_PROVIDER_SITE_OTHER): Payer: 59

## 2021-03-01 DIAGNOSIS — I639 Cerebral infarction, unspecified: Secondary | ICD-10-CM

## 2021-03-05 ENCOUNTER — Other Ambulatory Visit: Payer: Self-pay | Admitting: Family Medicine

## 2021-03-05 LAB — CUP PACEART REMOTE DEVICE CHECK
Date Time Interrogation Session: 20221006120224
Implantable Pulse Generator Implant Date: 20211008

## 2021-03-09 ENCOUNTER — Encounter: Payer: Self-pay | Admitting: Endocrinology

## 2021-03-09 LAB — HM DIABETES EYE EXAM

## 2021-03-09 NOTE — Progress Notes (Signed)
[facility] Summary Report / Loop Recorder 

## 2021-03-15 ENCOUNTER — Encounter: Payer: Self-pay | Admitting: Family Medicine

## 2021-03-20 ENCOUNTER — Other Ambulatory Visit: Payer: Self-pay | Admitting: Family Medicine

## 2021-03-21 NOTE — Telephone Encounter (Signed)
Last office visit 12/09/20 for long term disability follow up.  Last refilled 10/19/20 for #270 with 1 refill.  Next Appt: 06/14/20 for 6 month follow up.

## 2021-03-29 ENCOUNTER — Ambulatory Visit: Payer: 59 | Admitting: Family Medicine

## 2021-03-30 DIAGNOSIS — Z0289 Encounter for other administrative examinations: Secondary | ICD-10-CM

## 2021-03-31 ENCOUNTER — Other Ambulatory Visit: Payer: Self-pay | Admitting: Endocrinology

## 2021-03-31 ENCOUNTER — Encounter: Payer: Self-pay | Admitting: Endocrinology

## 2021-03-31 MED ORDER — REPAGLINIDE 0.5 MG PO TABS: 0.5000 mg | ORAL_TABLET | Freq: Two times a day (BID) | ORAL | 11 refills | Status: DC

## 2021-04-01 ENCOUNTER — Ambulatory Visit (INDEPENDENT_AMBULATORY_CARE_PROVIDER_SITE_OTHER): Payer: 59

## 2021-04-01 DIAGNOSIS — I639 Cerebral infarction, unspecified: Secondary | ICD-10-CM

## 2021-04-06 LAB — CUP PACEART REMOTE DEVICE CHECK
Date Time Interrogation Session: 20221108110247
Implantable Pulse Generator Implant Date: 20211008

## 2021-04-07 NOTE — Progress Notes (Signed)
[facility] Summary Report / Loop Recorder 

## 2021-04-08 ENCOUNTER — Telehealth: Payer: Self-pay | Admitting: Neurology

## 2021-04-08 NOTE — Telephone Encounter (Signed)
Patients Long term disability forms completed and faxed to [employer] [phone]

## 2021-04-29 ENCOUNTER — Encounter: Payer: Self-pay | Admitting: Family Medicine

## 2021-05-09 ENCOUNTER — Other Ambulatory Visit: Payer: Self-pay | Admitting: Family Medicine

## 2021-05-10 ENCOUNTER — Ambulatory Visit (INDEPENDENT_AMBULATORY_CARE_PROVIDER_SITE_OTHER): Payer: 59

## 2021-05-10 DIAGNOSIS — I639 Cerebral infarction, unspecified: Secondary | ICD-10-CM

## 2021-05-11 LAB — CUP PACEART REMOTE DEVICE CHECK
Date Time Interrogation Session: 20221211110845
Implantable Pulse Generator Implant Date: 20211008

## 2021-05-19 ENCOUNTER — Other Ambulatory Visit: Payer: Self-pay | Admitting: Family Medicine

## 2021-05-20 NOTE — Progress Notes (Signed)
[facility] Summary Report / Loop Recorder 

## 2021-06-07 ENCOUNTER — Other Ambulatory Visit: Payer: Self-pay | Admitting: Endocrinology

## 2021-06-07 DIAGNOSIS — E11622 Type 2 diabetes mellitus with other skin ulcer: Secondary | ICD-10-CM

## 2021-06-09 ENCOUNTER — Other Ambulatory Visit (HOSPITAL_COMMUNITY): Payer: Self-pay

## 2021-06-09 ENCOUNTER — Telehealth: Payer: Self-pay | Admitting: Pharmacy Technician

## 2021-06-09 NOTE — Telephone Encounter (Signed)
Patient Advocate Encounter   Received notification from CoverMyMeds that prior authorization for Trulicity 4.5mg  is required by his/her insurance [employer].  Per Test Claim: step therapy   PA started on 05/30/21 Key  [id number] Error - CoverMyMeds says pt isn't one of [employer].  Called [phone] pt is active. Initiated PA verbally.  Ref # [id number] - May take up to 15 days to review and make a determination, but realistically it's usually a couple days, per rep.  Faxing chart notes to [phone]

## 2021-06-09 NOTE — Telephone Encounter (Signed)
Patient Advocate Encounter   Received notification from CoverMyMeds that prior authorization for Trulicity is required by his/her [employer].  Unable to find prescription coverage information in [facility] or a card scanned in his act. Will f/u with pharmacy/pt.

## 2021-06-10 ENCOUNTER — Encounter: Payer: Self-pay | Admitting: Endocrinology

## 2021-06-10 ENCOUNTER — Other Ambulatory Visit (HOSPITAL_COMMUNITY): Payer: Self-pay

## 2021-06-10 ENCOUNTER — Other Ambulatory Visit: Payer: Self-pay

## 2021-06-10 ENCOUNTER — Telehealth: Payer: Self-pay | Admitting: Pharmacy Technician

## 2021-06-10 ENCOUNTER — Ambulatory Visit (INDEPENDENT_AMBULATORY_CARE_PROVIDER_SITE_OTHER): Payer: 59 | Admitting: Endocrinology

## 2021-06-10 VITALS — BP 136/78 | HR 84 | Ht 70.0 in | Wt 285.2 lb

## 2021-06-10 DIAGNOSIS — E11622 Type 2 diabetes mellitus with other skin ulcer: Secondary | ICD-10-CM | POA: Diagnosis not present

## 2021-06-10 DIAGNOSIS — E1159 Type 2 diabetes mellitus with other circulatory complications: Secondary | ICD-10-CM | POA: Diagnosis not present

## 2021-06-10 DIAGNOSIS — L97929 Non-pressure chronic ulcer of unspecified part of left lower leg with unspecified severity: Secondary | ICD-10-CM | POA: Diagnosis not present

## 2021-06-10 LAB — POCT GLYCOSYLATED HEMOGLOBIN (HGB A1C): Hemoglobin A1C: 6.7 % — AB (ref 4.0–5.6)

## 2021-06-10 MED ORDER — OZEMPIC (2 MG/DOSE) 8 MG/3ML ~~LOC~~ SOPN: 2.0000 mg | PEN_INJECTOR | SUBCUTANEOUS | 3 refills | Status: DC

## 2021-06-10 NOTE — Telephone Encounter (Signed)
Patient Advocate Encounter  Received notification from COVERMYMEDS Waunita Schooner) that prior authorization for [facility] 2MG ) is required.   PA submitted on 1.12.23 Key [id number] Status is pending   [facility] will continue to follow  3.12.23, CPhT Patient Advocate [facility] Phone: [phone] Fax:  [phone]

## 2021-06-10 NOTE — Progress Notes (Signed)
Subjective:    Patient ID: [name] [name], male    DOB: [birth year], 61 y.o.   MRN: [id number]  HPI Pt returns for f/u of diabetes mellitus:  DM type: 2 Dx'ed: 2010 Complications: PN, CVA's, and foot ulcers.   Therapy: trulicity and 3 oral meds. DKA: never Severe hypoglycemia: once, in 2016 Pancreatitis: never Pancreatic imaging: never SDOH: wife provides most of hx, due to pt's difficulty with concentration.   Other: he has never been on insulin; wife is nurse, and pt has also administered insulin to his father; edema limits rx options; he did not tolerate welchol (constipation) or bromocriptine (ED).  Interval history: no cbg record, but states cbg varies from 70-100's.  Pt is here with wife today.  Pharmacy cannot fill Trulicity.   Past Medical History:  Diagnosis Date   Angio-edema    Anxiety    Depression    Diabetes mellitus type II    type 2   GERD (gastroesophageal reflux disease)    Headache    migraine   History of kidney stones    HLD (hyperlipidemia)    HTN (hypertension)    Morbid obesity (HCC)    Restless legs syndrome (RLS) 11/20/2012   Stroke (HCC) 02/14/2020   ischemic/right sided affected   Tobacco abuse    Ulcer of left ankle (HCC)    last 2 months dime size dry dressing changing q day   Urticaria     Past Surgical History:  Procedure Laterality Date   Arm surgery Right 1969   Abscess excision and debridement at elbow   COLONOSCOPY WITH PROPOFOL N/A 09/06/2016   Procedure: COLONOSCOPY WITH PROPOFOL;  Surgeon: Sherrilyn Rist, MD;  Location: [facility];  Service: Gastroenterology;  Laterality: N/A;   I & D EXTREMITY Left 05/29/2014   Procedure: IRRIGATION AND DEBRIDEMENT OF LEFT LEG WOUND AND PLACEMENT OF  INTEGRA  AND  VAC;  Surgeon: Wayland Denis, DO;  Location: [facility];  Service: Plastics;  Laterality: Left;   implantable loop recorder implant  03/06/2020   Medtronic Reveal Bone Gap model LNQ11 (SN  [id number]) implanted by Dr  Johney Frame for cryptogenic stroke   INCISION AND DRAINAGE OF WOUND Left 04/23/2014   Procedure: IRRIGATION AND DEBRIDEMENT LOWER LEFT LEG WOUND WITH PLACEMENT OF INTEGRA AND VAC;  Surgeon: Wayland Denis, DO;  Location: [facility];  Service: Plastics;  Laterality: Left;   kidney stone removal  2004   MINOR APPLICATION OF WOUND VAC Left 04/23/2014   Procedure: MINOR APPLICATION OF WOUND VAC;  Surgeon: Wayland Denis, DO;  Location: [facility];  Service: Plastics;  Laterality: Left;   SKIN SPLIT GRAFT Left 06/18/2014   Procedure: SKIN GRAFT SPLIT THICKNESS TO LOWER LEFT LEG WOUND WITH PLACEMENT OF VAC;  Surgeon: Wayland Denis, DO;  Location: [facility];  Service: Plastics;  Laterality: Left;   VASECTOMY  1994    Social History   Socioeconomic History   Marital status: Married    Spouse name: Banker   Number of children: 3   Years of education: Not on file   Highest education level: Not on file  Occupational History   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    Comment: no longer working 07/07/20  Tobacco Use   Smoking status: Some Days    Packs/day: 0.25    Years: 40.00    Pack years: 10.00    Types: Cigarettes   Smokeless tobacco: Never   Tobacco comments:  half pack daily  Substance and Sexual Activity   Alcohol use: Not Currently    Alcohol/week: 0.0 standard drinks    Comment: rarely   Drug use: No   Sexual activity: Yes    Partners: Female  Other Topics Concern   Not on file  Social History Narrative   Lives with wife   Right Handed   Drink 6-7 cups caffeine daily   Social Determinants of Health   Financial Resource Strain: Not on file  Food Insecurity: Not on file  Transportation Needs: Not on file  Physical Activity: Not on file  Stress: Not on file  Social Connections: Not on file  Intimate Partner Violence: Not on file    Current Outpatient Medications on File Prior to Visit  Medication Sig Dispense  Refill   acetaminophen (TYLENOL) 500 MG tablet Take 1,000 mg by mouth 2 (two) times daily as needed (for pain).     apixaban (ELIQUIS) 5 MG TABS tablet TAKE 1 TABLET(5 MG) BY MOUTH TWICE DAILY 60 tablet 5   dapagliflozin propanediol (FARXIGA) 10 MG TABS tablet Take 1 tablet (10 mg total) by mouth daily. 90 tablet 3   EPINEPHRINE 0.3 mg/0.3 mL IJ SOAJ injection ADMINISTER 0.3 ML IN THE MUSCLE 1 TIME FOR 1 DOSE 2 each 0   glucose blood (CONTOUR NEXT TEST) test strip USE TO CHECK BLOOD SUGAR 100 strip 5   Lancets Micro Thin 33G MISC Use to check blood sugar two times daily. 100 each 5   lisinopril (ZESTRIL) 40 MG tablet TAKE 1 TABLET(40 MG) BY MOUTH DAILY 90 tablet 0   metFORMIN (GLUCOPHAGE-XR) 500 MG 24 hr tablet TAKE 4 TABLETS BY MOUTH DAILY WITH BREAKFAST 360 tablet 1   Multiple Vitamin (MULTIVITAMIN) tablet Take 1 tablet by mouth daily.     pantoprazole (PROTONIX) 40 MG tablet Take 1 tablet (40 mg total) by mouth daily. 90 tablet 3   pentoxifylline (TRENTAL) 400 MG CR tablet TAKE 1 TABLET BY MOUTH 3  TIMES DAILY WITH MEALS 270 tablet 3   pregabalin (LYRICA) 75 MG capsule TAKE 1 CAPSULE(75 MG) BY MOUTH THREE TIMES DAILY 270 capsule 1   repaglinide (PRANDIN) 0.5 MG tablet Take 1 tablet (0.5 mg total) by mouth 2 (two) times daily before a meal. 60 tablet 11   rOPINIRole (REQUIP) 0.5 MG tablet Take 1 tablet (0.5 mg total) by mouth 2 (two) times daily. 180 tablet 3   rosuvastatin (CRESTOR) 20 MG tablet TAKE 1 TABLET(20 MG) BY MOUTH AT BEDTIME 90 tablet 3   varenicline (CHANTIX STARTING MONTH PAK) 0.5 MG X 11 & 1 MG X 42 tablet Take one 0.5mg  tablet by mouth once daily for 3 days, then increase to one 0.5mg  tablet twice daily for 3 days, then increase to one 1mg  tablet twice daily. 53 tablet 0   varenicline (CHANTIX) 1 MG tablet TAKE 1 TABLET(1 MG) BY MOUTH TWICE DAILY 60 tablet 3   No current facility-administered medications on file prior to visit.    Allergies  Allergen Reactions   Cashew Nut  Oil Anaphylaxis    Cashews, 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   Pistachio Nut (Diagnostic) Hives, Shortness Of Breath, Itching, Swelling and Rash   Latex Dermatitis   Tape Other (See Comments)    Tears and bruises the skin   Betadine [Povidone Iodine] Rash and Other (See Comments)    Raised rash 04/23/14   Lidocaine Other (See Comments)    Minimal effect with lidocaine, prefers different anesthetic   Penicillins Rash and  Other (See Comments)    Rash all over as a child    Family History  Problem Relation Age of Onset   Stroke Mother        multiple   Leukemia Mother    Pneumonia Mother    Cancer Mother    Diabetes Mother    Diabetes Father    Heart disease Father    Hyperlipidemia Father    Hypertension Father    Allergic rhinitis Neg Hx    Angioedema Neg Hx    Asthma Neg Hx    Eczema Neg Hx    Immunodeficiency Neg Hx    Urticaria Neg Hx     BP 136/78 (BP Location: Left Arm, Patient Position: Sitting, Cuff Size: Normal)    Pulse 84    Ht 5\' 10"  (1.778 m)    Wt 285 lb 3.2 oz (129.4 kg)    SpO2 93%    BMI 40.92 kg/m    Review of Systems     Objective:   Physical Exam Pulses: dorsalis pedis intact bilat.   MSK: no deformity of the feet CV: 2+ bilat leg edema, and bilat vv's.  Skin: healed ulcers on the feet and legs.  normal temp on the feet, but there is (chronic) erythema of the feet.   Neuro: sensation is intact to touch on the feet, but severely decreased from normal.     Lab Results  Component Value Date   HGBA1C 6.7 (A) 06/10/2021      Assessment & Plan:  Type 2 DM: weight loss  Patient Instructions  I have sent a prescription to your pharmacy, to change Trulicity to Ozempic, and:  continue the same other diabetes medications.    check your blood sugar once a day.  vary the time of day when you check, between before the 3 meals, and at bedtime.  also check if you have symptoms of your blood sugar being too high or too low.  please keep a record of the readings and  bring it to your next appointment here (or you can bring the meter itself).  You can write it on any piece of paper.  please call us sooner if your blood sugar goes below 70, or if you have a lot of readings over 200.   Please come back for a follow-up appointment in 4 months.

## 2021-06-10 NOTE — Patient Instructions (Addendum)
I have sent a prescription to your pharmacy, to change Trulicity to Ozempic, and:  continue the same other diabetes medications.    check your blood sugar once a day.  vary the time of day when you check, between before the 3 meals, and at bedtime.  also check if you have symptoms of your blood sugar being too high or too low.  please keep a record of the readings and bring it to your next appointment here (or you can bring the meter itself).  You can write it on any piece of paper.  please call us sooner if your blood sugar goes below 70, or if you have a lot of readings over 200.   Please come back for a follow-up appointment in 4 months.

## 2021-06-11 ENCOUNTER — Other Ambulatory Visit (HOSPITAL_COMMUNITY): Payer: Self-pay

## 2021-06-11 NOTE — Telephone Encounter (Signed)
Patient Advocate Encounter  Prior Authorization for Trulicity has been approved.    PA# [id number]  Effective dates: 06/09/21 through 06/09/22  Per Test Claim Patients co-pay is $25   Spoke with Pharmacy to Process.  Patient Advocate Fax: [phone]

## 2021-06-11 NOTE — Telephone Encounter (Signed)
Patient Advocate Encounter  Prior Authorization for Ozempic has been approved.    PA# [id number]  Effective dates: 06/10/21 through 06/10/22  Per Test Claim Patients co-pay is $909.81 for [id number] days   Trulicity is definitely the cheaper option for the patient.  Patient Advocate Fax: [phone]

## 2021-06-12 ENCOUNTER — Other Ambulatory Visit: Payer: Self-pay | Admitting: Family Medicine

## 2021-06-12 ENCOUNTER — Encounter: Payer: Self-pay | Admitting: Endocrinology

## 2021-06-14 ENCOUNTER — Other Ambulatory Visit: Payer: Self-pay | Admitting: Endocrinology

## 2021-06-14 ENCOUNTER — Other Ambulatory Visit (HOSPITAL_COMMUNITY): Payer: Self-pay

## 2021-06-14 ENCOUNTER — Telehealth: Payer: Self-pay | Admitting: Pharmacy Technician

## 2021-06-14 ENCOUNTER — Ambulatory Visit (INDEPENDENT_AMBULATORY_CARE_PROVIDER_SITE_OTHER): Payer: 59

## 2021-06-14 ENCOUNTER — Ambulatory Visit: Payer: 59 | Admitting: Family Medicine

## 2021-06-14 DIAGNOSIS — I639 Cerebral infarction, unspecified: Secondary | ICD-10-CM

## 2021-06-14 MED ORDER — OZEMPIC (2 MG/DOSE) 8 MG/3ML ~~LOC~~ SOPN
2.0000 mg | PEN_INJECTOR | SUBCUTANEOUS | 3 refills | Status: DC
  Filled 2021-06-14: qty 9, 84d supply, fill #0

## 2021-06-14 MED ORDER — RYBELSUS 14 MG PO TABS: 14.0000 mg | ORAL_TABLET | Freq: Every day | ORAL | 3 refills | Status: DC

## 2021-06-14 NOTE — Telephone Encounter (Signed)
Message sent thru MyChart 

## 2021-06-14 NOTE — Telephone Encounter (Addendum)
Unfortunately this pt's ins also requires a PA for Rybelsus. It looks like our [employer], over behind [facility] has plenty of Ozempic and Trulicity for now, and it's available to order through [employer], their supplier.   Do you think he'd have a better response with the Ozempic or Trulicity? If so, would the pt be willing to change pharmacies? Or, he can probably have it mailed to him from the [facility].

## 2021-06-15 LAB — CUP PACEART REMOTE DEVICE CHECK
Date Time Interrogation Session: 20230115232456
Implantable Pulse Generator Implant Date: 20211008

## 2021-06-16 ENCOUNTER — Telehealth: Payer: Self-pay | Admitting: *Deleted

## 2021-06-16 NOTE — Telephone Encounter (Signed)
Received fax from [facility] requesting PA for Paroxetine 20 mg.  PA completed on CoverMyMeds and sent for review.  Can take up to 72 hours for a decision.

## 2021-06-18 ENCOUNTER — Other Ambulatory Visit: Payer: Self-pay | Admitting: Family Medicine

## 2021-06-18 ENCOUNTER — Encounter: Payer: Self-pay | Admitting: Endocrinology

## 2021-06-18 ENCOUNTER — Other Ambulatory Visit: Payer: Self-pay | Admitting: Endocrinology

## 2021-06-18 MED ORDER — PAROXETINE HCL 30 MG PO TABS: 30.0000 mg | ORAL_TABLET | Freq: Every day | ORAL | 5 refills | Status: DC

## 2021-06-18 MED ORDER — VICTOZA 18 MG/3ML ~~LOC~~ SOPN: 1.8000 mg | PEN_INJECTOR | Freq: Every day | SUBCUTANEOUS | 3 refills | Status: DC

## 2021-06-18 NOTE — Telephone Encounter (Signed)
Joelene Millin notified by telephone that insurance will not cover [name]'s Paxil 20 mg-3 tablets daily but they will cover the 30 mg-2 tablets daily. She is okay with that change and will [name] know.   New Rx sent to [facility] on [address] as instructed by Dr. Lorelei Pont.

## 2021-06-18 NOTE — Telephone Encounter (Signed)
PA Denied:  Looks like they will cover 30 mg-2 tablets daily.  Please advise if okay to send in Rx for Paroxetine 30 mg.  Denied on January 19 PA Case: [id number], Status: Denied, Denial Rationale: Coverage for PAROXETINE 20 MG TABLET (above quantity limit) is denied. It does not meet medical necessity. PAROXETINE 20 MG TABLET (above quantity limit) has been requested for the treatment of Depression. The information provided by your prescriber does not meet [employer]'s guideline. The guideline used is: Antidepressant Agents Quantity Limit Criteria. Information provided does not show that the policy requirements have been met. The following requirements were not met: The request for 90 tablets per 30 days (3 tablets per day) exceeds the formulary quantity limit of 30 tablets per 30 days (1 tablet per day). Additionally, the requested dose can be achieved with another covered and commercially available dose strength within the formulary quantity limit; PAROXETINE 30 MG TABLET is a commercially available dose strength, and is covered for up to 60 tablets per 30 days. Therefore, coverage for PAROXETINE 20 MG TABLET (above quantity limit) is denied. NOTE: Your plan will cover PAROXETINE 30 MG TABLET (60 tablets per 30 days) without prior authorization. Questions? Contact [phone].

## 2021-06-18 NOTE — Addendum Note (Signed)
Addended by: Carter Kitten on: 06/18/2021 04:46 PM   Modules accepted: Orders

## 2021-06-19 ENCOUNTER — Encounter: Payer: Self-pay | Admitting: Family Medicine

## 2021-06-20 MED ORDER — PAROXETINE HCL 30 MG PO TABS: 60.0000 mg | ORAL_TABLET | Freq: Every day | ORAL | 5 refills | Status: DC

## 2021-06-25 NOTE — Progress Notes (Signed)
[facility] Summary Report / Loop Recorder 

## 2021-06-29 ENCOUNTER — Other Ambulatory Visit: Payer: Self-pay | Admitting: Neurology

## 2021-06-29 DIAGNOSIS — I639 Cerebral infarction, unspecified: Secondary | ICD-10-CM

## 2021-07-06 ENCOUNTER — Encounter: Payer: Self-pay | Admitting: Neurology

## 2021-07-06 ENCOUNTER — Other Ambulatory Visit: Payer: Self-pay

## 2021-07-06 ENCOUNTER — Other Ambulatory Visit: Payer: Self-pay | Admitting: Family Medicine

## 2021-07-06 ENCOUNTER — Ambulatory Visit (INDEPENDENT_AMBULATORY_CARE_PROVIDER_SITE_OTHER): Payer: 59 | Admitting: Neurology

## 2021-07-06 VITALS — BP 109/71 | HR 78 | Ht 70.0 in | Wt 287.0 lb

## 2021-07-06 DIAGNOSIS — I6990 Unspecified sequelae of unspecified cerebrovascular disease: Secondary | ICD-10-CM | POA: Diagnosis not present

## 2021-07-06 DIAGNOSIS — G3184 Mild cognitive impairment, so stated: Secondary | ICD-10-CM | POA: Diagnosis not present

## 2021-07-06 DIAGNOSIS — R4701 Aphasia: Secondary | ICD-10-CM | POA: Diagnosis not present

## 2021-07-06 DIAGNOSIS — H5340 Unspecified visual field defects: Secondary | ICD-10-CM | POA: Diagnosis not present

## 2021-07-06 NOTE — Patient Instructions (Signed)
I had a long d/w patient about his remote cryptogenic strokes, mild residual aphasia, visual field defect and cognitive impairment, risk for recurrent stroke/TIAs, personally independently reviewed imaging studies and stroke evaluation results and answered questions.Continue Eliquis (apixaban) daily  for secondary stroke prevention and maintain strict control of hypertension with blood pressure goal below 130/90, diabetes with hemoglobin A1c goal below 6.5% and lipids with LDL cholesterol goal below 70 mg/dL. I also advised the patient to eat a healthy diet with plenty of whole grains, cereals, fruits and vegetables, exercise regularly and maintain ideal body weight.  Refer to sleep clinic for discussion about treatment options for obstructive sleep apnea.  I also recommend he increase participation in cognitively challenging activities like solving crossword puzzles, playing bridge and sudoku.  We also discussed memory compensation strategies.  Followup in the future with me in 1 year or call earlier if necessary. Memory Compensation Strategies  Use "WARM" strategy.  W= write it down  A= associate it  R= repeat it  M= make a mental note  2.   You can keep a Glass blower/designer.  Use a 3-ring notebook with sections for the following: calendar, important names and phone numbers,  medications, doctors' names/phone numbers, lists/reminders, and a section to journal what you did  each day.   3.    Use a calendar to write appointments down.  4.    Write yourself a schedule for the day.  This can be placed on the calendar or in a separate section of the Memory Notebook.  Keeping a  regular schedule can help memory.  5.    Use medication organizer with sections for each day or morning/evening pills.  You may need help loading it  6.    Keep a basket, or pegboard by the door.  Place items that you need to take out with you in the basket or on the pegboard.  You may also want to  include a message board for  reminders.  7.    Use sticky notes.  Place sticky notes with reminders in a place where the task is performed.  For example: " turn off the  stove" placed by the stove, "lock the door" placed on the door at eye level, " take your medications" on  the bathroom mirror or by the place where you normally take your medications.  8.    Use alarms/timers.  Use while cooking to remind yourself to check on food or as a reminder to take your medicine, or as a  reminder to make a call, or as a reminder to perform another task, etc.

## 2021-07-06 NOTE — Progress Notes (Signed)
[facility] [address] [place] [address] [phone]       OFFICE FOLLOW UP VISIT NOTE  [name] Date of Birth:  11/28/1960 Medical Record Number:  [id number]   Referring MD: Bonnielee Haff Reason for Referral: Stroke  HPI: Last visit 04/02/2020 [name] is a 61 year old Caucasian male seen today for initial office consultation visit for stroke.  He is accompanied by his wife.  History is obtained from them and review of electronic medical records and I personally reviewed imaging films in PACS.61 y.o. male with medical history significant of hypertension, hyperlipidemia, type 2 diabetes mellitus, restless leg syndrome, diabetic neuropathy, morbid obesity, tobacco abuse, depression/anxiety, GERD, chronic venous stasis ulcer in bilateral lower extremities presented to emergency department with headache and right thigh vision loss started 2 AM on the morning of admission on 02/14/2020.  He initially went to his ophthalmologist and was referred to the emergency department.  Concern was for acute stroke.  Patient was hospitalized for further management.  CT perfusion scan suggested a left occipital lobe infarct.  MRI was attempted but patient refused due to significant claustrophobia.  Subsequent repeat CT scan confirmed left posterior division MCA infarct.  CT angiogram showed no significant large vessel stenosis or occlusion.  Lower extremity venous Dopplers were negative for DVT.  Transthoracic echo showed normal ejection fraction without cardiac source of embolism.  LDL cholesterol was quite low at 15 mg percent and hemoglobin A1c was elevated 8.4.  Patient was started on aspirin and Plavix for 3 weeks followed by aspirin alone.  Patient states is done well since discharge she is now beginning to see some movement in the right peripheral field of vision but is still not able to count fingers.  He still has trouble reading and comprehending.  His speech has  improved though it is still slightly hesitant.  Is tolerating aspirin well without bruising or bleeding.  Patient is unable to return to work in [employer] and is currently on short-term disability.  He has not been driving.  He denies any prior history of atrial fibrillation, syncope or palpitations.  He had outpatient loop recorder placed by Dr. Rayann Heman and so for paroxysmal A. fib has not yet been found.  He also complains of mild short-term memory and cognitive difficulties which are new since his stroke. Update 07/07/2020: He returns for follow-up after last visit 3 months ago.  Is accompanied by his wife.  Patient has noted improvement in his speech peripheral vision but thinks continues to have occasional word finding difficulties and has to speak in a slow.  He notices that if he tries to talk too fast or get excited he has more trouble.  He noticed slight improvement in his right-sided peripheral vision particularly in the quadrant though he still has significant vision loss in the lower quadrant and has not been driving.  He was recently seen by optometry wrist who asked him not to drive.  Patient had loop recorder inserted and so for paroxysmal A. fib has not yet been found.  He did participate in the [place] stroke trial and qualified and is on the trial medication.  Today he signed consent to be in the cognitive soft study of the trial.  He is tolerating the trial medications well without bleeding or bruising.  His blood pressure is well controlled and today it is borderline at 140/85.  He states his sugars are doing quite good and last A1c checked in January this year was  5.8.  His lipids are satisfactory.  He has been overall eating a healthy diet and is quite controlled on what he eats and is overall lost 130 pounds over the last 1 year.  He is finished outpatient speech therapy but is enrolled to participate in [place] summer program for speech and language.His Paxil dose was recently increased by his  primary care physician to help with his underlying anxiety and stress.  He does have diabetic peripheral neuropathy but Lyrica seems to help his paresthesias quite well and is able to sleep well at night.  He works as an Passenger transport manager in [employer] but clearly due to his cognitive and speech difficulties he will not be able to work and may have to go on long-term disability.   Update 12/23/2020: He returns for follow-up after last visit with me 5 months ago.  Is accompanied by his wife.  Patient's wife called me in March 2022 stating patient had been complaining of some new headaches for the last couple of weeks and on a couple of occasions  she noticed that he was found staring into the distance as if he was not there but however when she called out his name he responded right away.  Denied seeing any twitching some is lips or hands or any involuntary seizure-like movements .he had an outpatient MRI scan done on 07/30/2020 which showed small acute to subacute infarcts involving the right thalamocapsular junction, left frontal and bilateral parietal and left temporal regions with old chronic left PCA infarct and chronic right occipital and cerebellar and thalamic infarcts as well.  He had outpatient EEG on 09/01/2022 which was slightly abnormal showing mild slowing but no epileptiform activity.  Patient was in the [place] trial and given the strokes he was asked to discontinue the study medication and was started on Eliquis after he was found to have paroxysmal A. fib on the loop recorder on 09/05/2020.  Patient is tolerating Eliquis well with only minor bruising and no bleeding.  Continues to have short-term memory difficulties and mild word finding difficulties as well.  He has trouble with multitasking as well.  He recently had neuropsych testing which she had a tough time completing however I do not have the results to review today.  He is doing some crossword puzzles but not doing many mentally challenging  activities at present.  Is also not been physically very active.  He uses a cane to walk he has had no falls or injuries.  He has had no further recurrent stroke or TIA symptoms. Update 07/06/2021 : He returns for follow-up after last visit 6 months ago.  He is accompanied by his wife he is doing well.  He has had no recurrent TIA or stroke symptoms.  He remains on Eliquis which is tolerating well without bruising or bleeding.  States diabetes is under good control and last hemoglobin A1c was 6.7 on 06/08/2021.  His blood pressure also is under good control and today it is 109/71.  He remains on Crestor which is tolerating well without muscle aches and pains.  Patient has been recently started on Paxil for anxiety depression which is not sure is working yet.  Continues to have short-term memory difficulties which are present but not getting worse.  He is mostly independent in activities of daily living.  Team needs slight help with getting in and out of shower more for safety reasons we can bathe himself.  He also occasionally stumbles on certain words and when  he tries to talk fast.  His mental processing is also slow.  Patient has known obstructive sleep apnea in the past had seen a pulmonologist and was prescribed CPAP but he lost a lot of weight and after that he was told him in no longer needed.  He also has some trouble reading he can see the words but sometimes they do not make sense and he is very slow with his reading. ROS:   14 system review of systems is positive for vision difficulties memory loss, speech and word finding difficulties difficulty with reading and comprehension, anxiety all other systems negative PMH:  Past Medical History:  Diagnosis Date   Angio-edema    Anxiety    Depression    Diabetes mellitus type II    type 2   GERD (gastroesophageal reflux disease)    Headache    migraine   History of kidney stones    HLD (hyperlipidemia)    HTN (hypertension)    Morbid obesity (HCC)     Restless legs syndrome (RLS) 11/20/2012   Stroke ([place]) 02/14/2020   ischemic/right sided affected   Tobacco abuse    Ulcer of left ankle (HCC)    last 2 months dime size dry dressing changing q day   Urticaria     Social History:  Social History   Socioeconomic History   Marital status: Married    Spouse name: Regulatory affairs officer   Number of children: 3   Years of education: Not on file   Highest education level: Not on file  Occupational History   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    Comment: no longer working 07/07/20  Tobacco Use   Smoking status: Some Days    Packs/day: 0.25    Years: 40.00    Pack years: 10.00    Types: Cigarettes   Smokeless tobacco: Never   Tobacco comments:    half pack daily  Substance and Sexual Activity   Alcohol use: Not Currently    Alcohol/week: 0.0 standard drinks    Comment: rarely   Drug use: No   Sexual activity: Yes    Partners: Female  Other Topics Concern   Not on file  Social History Narrative   Lives with wife   Right Handed   Drink 6-7 cups caffeine daily   Social Determinants of Health   Financial Resource Strain: Not on file  Food Insecurity: Not on file  Transportation Needs: Not on file  Physical Activity: Not on file  Stress: Not on file  Social Connections: Not on file  Intimate Partner Violence: Not on file    Medications:   Current Outpatient Medications on File Prior to Visit  Medication Sig Dispense Refill   acetaminophen (TYLENOL) 500 MG tablet Take 1,000 mg by mouth 2 (two) times daily as needed (for pain).     apixaban (ELIQUIS) 5 MG TABS tablet TAKE 1 TABLET(5 MG) BY MOUTH TWICE DAILY 60 tablet 5   dapagliflozin propanediol (FARXIGA) 10 MG TABS tablet Take 1 tablet (10 mg total) by mouth daily. 90 tablet 3   EPINEPHRINE 0.3 mg/0.3 mL IJ SOAJ injection ADMINISTER 0.3 ML IN THE MUSCLE 1 TIME FOR 1 DOSE 2 each 0   glucose blood (CONTOUR NEXT TEST) test strip USE TO CHECK BLOOD SUGAR 100 strip  5   Lancets Micro Thin 33G MISC Use to check blood sugar two times daily. 100 each 5   liraglutide (VICTOZA) 18 MG/3ML SOPN Inject 1.8 mg into the skin daily.  9 mL 3   lisinopril (ZESTRIL) 40 MG tablet TAKE 1 TABLET(40 MG) BY MOUTH DAILY 90 tablet 0   metFORMIN (GLUCOPHAGE-XR) 500 MG 24 hr tablet TAKE 4 TABLETS BY MOUTH DAILY WITH BREAKFAST 360 tablet 1   Multiple Vitamin (MULTIVITAMIN) tablet Take 1 tablet by mouth daily.     pantoprazole (PROTONIX) 40 MG tablet Take 1 tablet (40 mg total) by mouth daily. 90 tablet 3   PARoxetine (PAXIL) 30 MG tablet Take 2 tablets (60 mg total) by mouth daily. 60 tablet 5   pentoxifylline (TRENTAL) 400 MG CR tablet TAKE 1 TABLET BY MOUTH THREE TIMES DAILY WITH MEALS 270 tablet 3   pregabalin (LYRICA) 75 MG capsule TAKE 1 CAPSULE(75 MG) BY MOUTH THREE TIMES DAILY 270 capsule 1   repaglinide (PRANDIN) 0.5 MG tablet Take 1 tablet (0.5 mg total) by mouth 2 (two) times daily before a meal. 60 tablet 11   rOPINIRole (REQUIP) 0.5 MG tablet Take 1 tablet (0.5 mg total) by mouth 2 (two) times daily. 180 tablet 3   rosuvastatin (CRESTOR) 20 MG tablet TAKE 1 TABLET(20 MG) BY MOUTH AT BEDTIME 90 tablet 3   varenicline (CHANTIX STARTING MONTH PAK) 0.5 MG X 11 & 1 MG X 42 tablet Take one 0.5mg  tablet by mouth once daily for 3 days, then increase to one 0.5mg  tablet twice daily for 3 days, then increase to one 1mg  tablet twice daily. 53 tablet 0   varenicline (CHANTIX) 1 MG tablet TAKE 1 TABLET(1 MG) BY MOUTH TWICE DAILY 60 tablet 3   No current facility-administered medications on file prior to visit.    Allergies:   Allergies  Allergen Reactions   Cashew Nut Oil Anaphylaxis    Cashews, 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   Pistachio Nut (Diagnostic) Hives, Shortness Of Breath, Itching, Swelling and Rash   Latex Dermatitis   Tape Other (See Comments)    Tears and bruises the skin   Betadine [Povidone Iodine] Rash and Other (See Comments)    Raised rash 04/23/14   Lidocaine Other  (See Comments)    Minimal effect with lidocaine, prefers different anesthetic   Penicillins Rash and Other (See Comments)    Rash all over as a child    Physical Exam General: Obese middle-aged Caucasian male seated, in no evident distress Head: head normocephalic and atraumatic.   Neck: supple with no carotid or supraclavicular bruits Cardiovascular: regular rate and rhythm, no murmurs Musculoskeletal: no deformity Skin:  no rash/petichiae Vascular:  Normal pulses all extremities  Neurologic Exam Mental Status: Awake and fully alert. Oriented to place and time. Recent and remote memory intact. Attention span, concentration and fund of knowledge appropriate. Mood and affect appropriate.  Speech is slightly nonfluent, deliberate and hesitant but no dysarthria or paraphasic errors.  Diminished recall 2/3.  Able to name 9 animals which can walk on 4 legs.  Clock drawing 4/4. Cranial Nerves: Fundoscopic exam not done. Pupils equal, briskly reactive to light. Extraocular movements full without nystagmus. Visual fields show partial right homonymous hemianopsia to confrontation with more deficit in the inferior quadrant.Marland Kitchen Hearing intact. Facial sensation intact.  Mild right lower facial asymmetry.  Tongue, palate moves normally and symmetrically.  Motor: Normal bulk and tone. Normal strength in all tested extremity muscles. Sensory.: intact to touch , pinprick , position and vibratory sensation.  Coordination: Rapid alternating movements normal in all extremities. Finger-to-nose and heel-to-shin performed accurately bilaterally. Gait and Station: Arises from chair without difficulty. Stance is normal. Gait demonstrates normal stride length  and balance . Able to heel, toe and tandem walk with great difficulty.  Reflexes: 1+ and symmetric. Toes downgoing.   NIHSS  3 Modified Rankin  2   ASSESSMENT: 61 year old Caucasian male with embolic left MCA branch infarct in September 2021 and bicerebral  small infarcts in March 2022 of cryptogenic etiology.  But subsequently paroxysmal A. fib found on loop recorder on 09/05/2020. Vascular risk factors of diabetes, hypertension hyperlipidemia. PAF and obesity.  He also has persistent right-sided peripheral vision loss, mild expressive language difficulties mild post stroke cognitive impairment.  He is doing well and stable from neurovascular standpoint.     PLAN: I had a long d/w patient about his remote cryptogenic strokes, mild residual aphasia, visual field defect and cognitive impairment, risk for recurrent stroke/TIAs, personally independently reviewed imaging studies and stroke evaluation results and answered questions.Continue Eliquis (apixaban) daily  for secondary stroke prevention and maintain strict control of hypertension with blood pressure goal below 130/90, diabetes with hemoglobin A1c goal below 6.5% and lipids with LDL cholesterol goal below 70 mg/dL. I also advised the patient to eat a healthy diet with plenty of whole grains, cereals, fruits and vegetables, exercise regularly and maintain ideal body weight.  Refer to sleep clinic for discussion about treatment options for obstructive sleep apnea.  I also recommend he increase participation in cognitively challenging activities like solving crossword puzzles, playing bridge and sudoku.  We also discussed memory compensation strategies.  Followup in the future with me in 1 year or call earlier if necessary.Greater than 50% time during this prolonged 35-minute visit was spent on counseling and coordination of care about his cryptogenic stroke discussion about stroke prevention treatment and answering questions. Antony Contras, MD Note: This document was prepared with digital dictation and possible smart phrase technology. Any transcriptional errors that result from this process are unintentional.

## 2021-07-07 ENCOUNTER — Ambulatory Visit: Payer: Self-pay | Admitting: Neurology

## 2021-07-12 ENCOUNTER — Other Ambulatory Visit: Payer: Self-pay

## 2021-07-12 MED ORDER — APIXABAN 5 MG PO TABS: ORAL_TABLET | ORAL | 5 refills | Status: DC

## 2021-07-12 NOTE — Telephone Encounter (Signed)
Prescription refill request for Eliquis received. Indication:afib  Last office visit:09/1920 Noralyn Pick)  Scr:1.04 (12/09/20)  Age: 61 Weight: 130.2kg  Appropriate dose and refill sent to requested pharmacy.

## 2021-07-14 ENCOUNTER — Other Ambulatory Visit (INDEPENDENT_AMBULATORY_CARE_PROVIDER_SITE_OTHER): Payer: 59

## 2021-07-14 ENCOUNTER — Other Ambulatory Visit: Payer: Self-pay

## 2021-07-14 DIAGNOSIS — Z125 Encounter for screening for malignant neoplasm of prostate: Secondary | ICD-10-CM

## 2021-07-14 DIAGNOSIS — E1159 Type 2 diabetes mellitus with other circulatory complications: Secondary | ICD-10-CM | POA: Diagnosis not present

## 2021-07-14 DIAGNOSIS — R5383 Other fatigue: Secondary | ICD-10-CM

## 2021-07-14 DIAGNOSIS — Z79899 Other long term (current) drug therapy: Secondary | ICD-10-CM

## 2021-07-14 DIAGNOSIS — E785 Hyperlipidemia, unspecified: Secondary | ICD-10-CM

## 2021-07-14 DIAGNOSIS — E538 Deficiency of other specified B group vitamins: Secondary | ICD-10-CM

## 2021-07-14 DIAGNOSIS — E559 Vitamin D deficiency, unspecified: Secondary | ICD-10-CM

## 2021-07-15 LAB — LIPID PANEL
Chol/HDL Ratio: 3.5 ratio (ref 0.0–5.0)
Cholesterol, Total: 134 mg/dL (ref 100–199)
HDL: 38 mg/dL — ABNORMAL LOW (ref >39)
LDL Chol Calc (NIH): 59 mg/dL (ref 0–99)
Triglycerides: 231 mg/dL — ABNORMAL HIGH (ref 0–149)
VLDL Cholesterol Cal: 37 mg/dL (ref 5–40)

## 2021-07-15 LAB — HEPATIC FUNCTION PANEL
ALT: 22 IU/L (ref 0–44)
AST: 20 IU/L (ref 0–40)
Albumin: 4.2 g/dL (ref 3.8–4.9)
Alkaline Phosphatase: 61 IU/L (ref 44–121)
Bilirubin Total: 0.4 mg/dL (ref 0.0–1.2)
Bilirubin, Direct: 0.15 mg/dL (ref 0.00–0.40)
Total Protein: 6.2 g/dL (ref 6.0–8.5)

## 2021-07-15 LAB — BASIC METABOLIC PANEL
BUN/Creatinine Ratio: 17 (ref 10–24)
BUN: 17 mg/dL (ref 8–27)
CO2: 27 mmol/L (ref 20–29)
Calcium: 9.7 mg/dL (ref 8.6–10.2)
Chloride: 104 mmol/L (ref 96–106)
Creatinine, Ser: 0.98 mg/dL (ref 0.76–1.27)
Glucose: 166 mg/dL — ABNORMAL HIGH (ref 70–99)
Potassium: 4.3 mmol/L (ref 3.5–5.2)
Sodium: 142 mmol/L (ref 134–144)
eGFR: 88 mL/min/{1.73_m2} (ref >59)

## 2021-07-15 LAB — CBC WITH DIFFERENTIAL/PLATELET
Basophils Absolute: 0.1 10*3/uL (ref 0.0–0.2)
Basos: 1 %
EOS (ABSOLUTE): 0.2 10*3/uL (ref 0.0–0.4)
Eos: 2 %
Hematocrit: 47.5 % (ref 37.5–51.0)
Hemoglobin: 16.1 g/dL (ref 13.0–17.7)
Immature Grans (Abs): 0 10*3/uL (ref 0.0–0.1)
Immature Granulocytes: 0 %
Lymphocytes Absolute: 2.4 10*3/uL (ref 0.7–3.1)
Lymphs: 27 %
MCH: 29.3 pg (ref 26.6–33.0)
MCHC: 33.9 g/dL (ref 31.5–35.7)
MCV: 87 fL (ref 79–97)
Monocytes Absolute: 1 10*3/uL — ABNORMAL HIGH (ref 0.1–0.9)
Monocytes: 11 %
Neutrophils Absolute: 5.2 10*3/uL (ref 1.4–7.0)
Neutrophils: 59 %
Platelets: 207 10*3/uL (ref 150–450)
RBC: 5.49 x10E6/uL (ref 4.14–5.80)
RDW: 13.1 % (ref 11.6–15.4)
WBC: 8.8 10*3/uL (ref 3.4–10.8)

## 2021-07-15 LAB — VITAMIN D 25 HYDROXY (VIT D DEFICIENCY, FRACTURES): Vit D, 25-Hydroxy: 28.2 ng/mL — ABNORMAL LOW (ref 30.0–100.0)

## 2021-07-15 LAB — PSA, TOTAL AND FREE
PSA, Free Pct: 26.7 %
PSA, Free: 0.08 ng/mL
Prostate Specific Ag, Serum: 0.3 ng/mL (ref 0.0–4.0)

## 2021-07-15 LAB — HEMOGLOBIN A1C
Est. average glucose Bld gHb Est-mCnc: 137 mg/dL
Hgb A1c MFr Bld: 6.4 % — ABNORMAL HIGH (ref 4.8–5.6)

## 2021-07-15 LAB — VITAMIN B12: Vitamin B-12: 1407 pg/mL — ABNORMAL HIGH (ref 232–1245)

## 2021-07-15 LAB — TSH: TSH: 2.21 u[IU]/mL (ref 0.450–4.500)

## 2021-07-16 LAB — CUP PACEART REMOTE DEVICE CHECK
Date Time Interrogation Session: 20230217084513
Implantable Pulse Generator Implant Date: 20211008

## 2021-07-19 ENCOUNTER — Ambulatory Visit: Payer: 59 | Admitting: Family Medicine

## 2021-07-19 ENCOUNTER — Ambulatory Visit (INDEPENDENT_AMBULATORY_CARE_PROVIDER_SITE_OTHER): Payer: 59

## 2021-07-19 ENCOUNTER — Telehealth: Payer: Self-pay

## 2021-07-19 DIAGNOSIS — I639 Cerebral infarction, unspecified: Secondary | ICD-10-CM

## 2021-07-19 NOTE — Telephone Encounter (Signed)
The patient wife returning nurse call. I let her speak with Portia, rn.

## 2021-07-19 NOTE — Telephone Encounter (Signed)
Patient returned call. Call transferred to [facility], CMA.

## 2021-07-19 NOTE — Telephone Encounter (Signed)
ILR alert report received. Battery status OK. Normal device function. No new symptom, tachy, brady, or pause episodes. Two new AFL episodes that appear to be ongoing.    Pt has known history of AF, on [place].  Attempted to reach patient, message left on wife's VM (DPR on file) requesting callback to discuss the AF episode and obtain follow-up transmission.

## 2021-07-19 NOTE — Telephone Encounter (Signed)
Successful telephone encounter with patient and wife to follow up on AF/AFL. Per wife (states she is a retired [employer] scientist), patient has been symptomatic with chest heaviness, tightness, and a "flopping" sensation in his chest. These symptoms have not been constant. Patient has also complained of worsening fatigue. These symptoms have now resolved. Manual transmission sent. Presenting rhythm is sinus at 75 bpm. Per wife patient has been seen at AF clinic in the past. Encouraged wife to call [facility] if future episodes with symptoms occur. Patient is not on medications for rate control or antiarrythmic. He is compliant with his eliquis. Patient wife is provided device clinic contact and encouraged to send manual transmission and call clinic with symptoms. Appreciative of follow up. Cont to monitor.

## 2021-07-20 MED ORDER — DILTIAZEM HCL 30 MG PO TABS: ORAL_TABLET | ORAL | 1 refills | Status: DC

## 2021-07-20 NOTE — Addendum Note (Signed)
Addended by: Juluis Mire on: 07/20/2021 02:42 PM   Modules accepted: Orders

## 2021-07-20 NOTE — Telephone Encounter (Signed)
Per Rudi Coco NP will call in PRN cardizem to use for breakthrough afib. Patient is currently under a lot of stress and the episodes seem to correlate with this. Will call back if frequency and duration continue to increase.  Seeing his PCP on Thursday to help address stress management.

## 2021-07-22 ENCOUNTER — Other Ambulatory Visit: Payer: Self-pay

## 2021-07-22 ENCOUNTER — Encounter: Payer: Self-pay | Admitting: Family Medicine

## 2021-07-22 ENCOUNTER — Ambulatory Visit (INDEPENDENT_AMBULATORY_CARE_PROVIDER_SITE_OTHER): Payer: 59 | Admitting: Family Medicine

## 2021-07-22 VITALS — BP 110/70 | HR 77 | Temp 98.0°F | Ht 69.5 in | Wt 289.4 lb

## 2021-07-22 DIAGNOSIS — F331 Major depressive disorder, recurrent, moderate: Secondary | ICD-10-CM

## 2021-07-22 DIAGNOSIS — E1142 Type 2 diabetes mellitus with diabetic polyneuropathy: Secondary | ICD-10-CM

## 2021-07-22 DIAGNOSIS — Z0001 Encounter for general adult medical examination with abnormal findings: Secondary | ICD-10-CM | POA: Diagnosis not present

## 2021-07-22 DIAGNOSIS — M79671 Pain in right foot: Secondary | ICD-10-CM

## 2021-07-22 DIAGNOSIS — M79672 Pain in left foot: Secondary | ICD-10-CM

## 2021-07-22 MED ORDER — FLUOXETINE HCL 20 MG PO CAPS: 20.0000 mg | ORAL_CAPSULE | Freq: Every day | ORAL | 2 refills | Status: DC

## 2021-07-22 NOTE — Patient Instructions (Addendum)
Paxil drop down to 20 mg twice a day for 2 weeks.  Then drop down to Paxil 20 mg once a day for 1 month  AT THE SAME TIME, start Prozac 20 mg

## 2021-07-22 NOTE — Progress Notes (Signed)
[name] Craddock T. Jessee Newnam, MD, [place] at [facility] [name], [address]  Phone: [phone]   FAX: [phone]  [name] [name] - 61 y.o. male   MRN [id number]   Date of Birth: December 14, 1960  Date: 07/22/2021   PCP: Owens Loffler, MD   Referral: Owens Loffler, MD  Chief Complaint  Patient presents with   Annual Exam    This visit occurred during the SARS-CoV-2 public health emergency.  Safety protocols were in place, including screening questions prior to the visit, additional usage of staff PPE, and extensive cleaning of exam room while observing appropriate contact time as indicated for disinfecting solutions.   Patient Care Team: Owens Loffler, MD as PCP - General Druscilla Brownie, MD as Consulting Physician (Dermatology) Johnathan Hausen, MD as Consulting Physician (General Surgery) Subjective:   [name] is a 61 y.o. pleasant patient who presents with the following:  Preventative Health Maintenance Visit:  Health Maintenance Summary Reviewed and updated, unless pt declines services.  Tobacco History Reviewed.  He has stopped smoking! Alcohol: No concerns, no excessive use Exercise Habits: He is trying.   STD concerns: no risk or activity to increase risk Drug Use: None  Shingrix - just had it.  Diabetes Mellitus: Tolerating Medications: yes Compliance with diet: fair, Body mass index is 42.12 kg/m. Exercise: minimal / intermittent Avg blood sugars at home: not checking Foot problems: none Hypoglycemia: none No nausea, vomitting, blurred vision, polyuria.  R shoulder.  Left wound at the same time.  Has been getting much worse.  Weaker post-stroke.   Increased pain, increased at night Lyrica? Increase This is pain in his legs.  Toes - ? Podiatry Bottom of foot will hurt at times.   L - post wound numbness and lack of motion This is all on the left lower extremity  Paxil? Still having  depression.  He has been on the same dose of Paxil for some time.  He is still having some depression, crying episodes and anhedonia.  Sleep is off and on.  Lab Results  Component Value Date   HGBA1C 6.4 (H) 07/14/2021   HGBA1C 6.7 (A) 06/10/2021   HGBA1C 5.9 (A) 02/24/2021   Lab Results  Component Value Date   MICROALBUR 2.82 (H) 07/27/2007   LDLCALC 59 07/14/2021   CREATININE 0.98 07/14/2021    Wt Readings from Last 3 Encounters:  07/22/21 289 lb 6 oz (131.3 kg)  07/06/21 287 lb (130.2 kg)  06/10/21 285 lb 3.2 oz (129.4 kg)    Lipids: Doing well, stable. Tolerating meds fine with no SE. Panel reviewed with patient.  Lipids: Lab Results  Component Value Date   CHOL 134 07/14/2021   Lab Results  Component Value Date   HDL 38 (L) 07/14/2021   Lab Results  Component Value Date   LDLCALC 59 07/14/2021   Lab Results  Component Value Date   TRIG 231 (H) 07/14/2021   Lab Results  Component Value Date   CHOLHDL 3.5 07/14/2021    Lab Results  Component Value Date   ALT 22 07/14/2021   AST 20 07/14/2021   ALKPHOS 61 07/14/2021   BILITOT 0.4 07/14/2021    HTN: Tolerating all medications without side effects Stable and at goal No CP, no sob. No HA.  BP Readings from Last 3 Encounters:  07/22/21 110/70  07/06/21 109/71  06/10/21 335/45    Basic Metabolic Panel:    Component Value Date/Time   NA  142 07/14/2021 1000   K 4.3 07/14/2021 1000   CL 104 07/14/2021 1000   CO2 27 07/14/2021 1000   BUN 17 07/14/2021 1000   CREATININE 0.98 07/14/2021 1000   GLUCOSE 166 (H) 07/14/2021 1000   GLUCOSE 83 10/15/2020 1100   CALCIUM 9.7 07/14/2021 1000     Health Maintenance  Topic Date Due   Zoster Vaccines- Shingrix (1 of 2) Never done   HEMOGLOBIN A1C  01/11/2022   OPHTHALMOLOGY EXAM  03/09/2022   FOOT EXAM  07/22/2022   TETANUS/TDAP  04/20/2026   COLONOSCOPY (Pts 45-31yr Insurance coverage will need to be confirmed)  09/07/2026   INFLUENZA VACCINE   Completed   COVID-19 Vaccine  Completed   Hepatitis C Screening  Completed   HIV Screening  Completed   HPV VACCINES  Aged Out   Immunization History  Administered Date(s) Administered   Influenza,inj,Quad PF,6+ Mos 04/20/2016, 03/23/2017, 03/19/2018, 02/06/2019, 02/20/2020   Influenza-Unspecified 03/31/2015, 04/01/2021   Janssen (J&J) SARS-COV-2 Vaccination 09/27/2019, 04/17/2020   Moderna Covid-19 Vaccine Bivalent Booster 117yr& up 04/01/2021   Pneumococcal Polysaccharide-23 11/25/2010, 04/20/2016   Tdap 04/20/2016   Patient Active Problem List   Diagnosis Date Noted   Ischemic stroke ([facility] occipital     Priority: High   Type 2 diabetes mellitus with vascular disease (HCH[facility]    Priority: High   Morbid obesity (HCWashington05/06/2006    Priority: High   PAF (paroxysmal atrial fibrillation) (HCA[place]04/08/2020    Priority: Medium    Diabetic polyneuropathy associated with type 2 diabetes mellitus (HCShosh[place]02/05/2018    Priority: Medium    Circulation disorder of lower extremity 05/31/2015    Priority: Medium    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 (HCAv[place]11/25/2015    Priority: Medium    Hyperlipidemia LDL goal <70 01/16/2009    Priority: Medium    TOBACCO ABUSE 07/27/2007    Priority: Medium    Essential hypertension 09/29/2006    Priority: Medium    Panic attacks 06/23/2020   Aortic calcification (HCChrist[place]09/25/2021   Carotid artery calcification, bilateral 02/22/2020   MDD (major depressive disorder), recurrent episode (HCLowgap   GERD (gastroesophageal reflux disease)    Erectile dysfunction due to type 2 diabetes mellitus (HCVigo02/05/2018   IBS (irritable bowel syndrome) 12/24/2014   Restless legs syndrome (RLS) 11/20/2012   Generalized anxiety disorder    CALCULUS, KIDNEY 05/30/2002    Past Medical History:  Diagnosis Date   Angio-edema    Anxiety    Depression    Diabetes mellitus type II    type 2   GERD (gastroesophageal reflux disease)    Headache     migraine   History of kidney stones    HLD (hyperlipidemia)    HTN (hypertension)    Morbid obesity (HCC)    Restless legs syndrome (RLS) 11/20/2012   Stroke (HCTownsend09/17/2021   ischemic/right sided affected   Tobacco abuse    Ulcer of left ankle (HCC)    last 2 months dime size dry dressing changing q day    Past Surgical History:  Procedure Laterality Date   Arm surgery Right 1969   Abscess excision and debridement at elbow   COLONOSCOPY WITH PROPOFOL N/A 09/06/2016   Procedure: COLONOSCOPY WITH PROPOFOL;  Surgeon: HeDoran StablerMD;  [facility];  Service: Gastroenterology;  Laterality: N/A;   I & D EXTREMITY Left 05/29/2014   Procedure: IRRIGATION AND DEBRIDEMENT OF LEFT LEG WOUND  AND PLACEMENT OF  INTEGRA  AND  VAC;  Surgeon: Theodoro Kos, DO;  Location: [place];  Service: Plastics;  Laterality: Left;   implantable loop recorder implant  03/06/2020   Medtronic Reveal [place] model LNQ11 (SN  [id number]) implanted by Dr Rayann Heman for cryptogenic stroke   INCISION AND DRAINAGE OF WOUND Left 04/23/2014   Procedure: IRRIGATION AND DEBRIDEMENT LOWER LEFT LEG WOUND WITH PLACEMENT OF INTEGRA AND VAC;  Surgeon: Theodoro Kos, DO;  Location: [place];  Service: Plastics;  Laterality: Left;   kidney stone removal  6720   MINOR APPLICATION OF WOUND VAC Left 04/23/2014   Procedure: MINOR APPLICATION OF WOUND VAC;  Surgeon: Theodoro Kos, DO;  Location: [place];  Service: Plastics;  Laterality: Left;   SKIN SPLIT GRAFT Left 06/18/2014   Procedure: SKIN GRAFT SPLIT THICKNESS TO LOWER LEFT LEG WOUND WITH PLACEMENT OF VAC;  Surgeon: Theodoro Kos, DO;  Location: [place];  Service: Plastics;  Laterality: Left;   VASECTOMY  1994    Family History  Problem Relation Age of Onset   Stroke Mother        multiple   Leukemia Mother    Pneumonia Mother    Cancer Mother    Diabetes Mother    Diabetes Father    Heart  disease Father    Hyperlipidemia Father    Hypertension Father    Allergic rhinitis Neg Hx    Angioedema Neg Hx    Asthma Neg Hx    Eczema Neg Hx    Immunodeficiency Neg Hx    Urticaria Neg Hx     Past Medical History, Surgical History, Social History, Family History, Problem List, Medications, and Allergies have been reviewed and updated if relevant.  Review of Systems: Pertinent positives are listed above.  Otherwise, a full 14 point review of systems has been done in full and it is negative except where it is noted positive.  Objective:   BP 110/70    Pulse 77    Temp 98 F (36.7 C) (Temporal)    Ht 5' 9.5" (1.765 m)    Wt 289 lb 6 oz (131.3 kg)    SpO2 94%    BMI 42.12 kg/m  Ideal Body Weight: Weight in (lb) to have BMI = 25: 171.4  Ideal Body Weight: Weight in (lb) to have BMI = 25: 171.4 No results found. Depression screen [facility] 2/9 07/22/2021 06/22/2020 06/28/2018  Decreased Interest 2 0 0  Down, Depressed, Hopeless 3 1 0  PHQ - 2 Score 5 1 0  Altered sleeping 3 0 -  Tired, decreased energy 3 0 -  Change in appetite 2 0 -  Feeling bad or failure about yourself  2 1 -  Trouble concentrating 3 1 -  Moving slowly or fidgety/restless 3 1 -  Suicidal thoughts 2 0 -  PHQ-9 Score 23 4 -  Difficult doing work/chores Extremely dIfficult Somewhat difficult -  Some recent data might be hidden     GEN: well developed, well nourished, no acute distress Eyes: conjunctiva and lids normal, PERRLA, EOMI ENT: TM clear, nares clear, oral exam WNL Neck: supple, no lymphadenopathy, no thyromegaly, no JVD Pulm: clear to auscultation and percussion, respiratory effort normal CV: regular rate and rhythm, S1-S2, no murmur, rub or gallop, no bruits, peripheral pulses normal and symmetric, no cyanosis, clubbing, edema or varicosities GI: soft, non-tender; no hepatosplenomegaly, masses; active bowel sounds all quadrants GU: deferred Lymph: no  cervical, axillary or inguinal adenopathy MSK:  gait normal, muscle tone and strength WNL, no joint swelling, effusions, discoloration, crepitus  SKIN: clear, good turgor, color WNL, no rashes, lesions, or ulcerations Neuro: normal mental status, normal strength, sensation, and motion Psych: alert; oriented to person, place and time, normally interactive and not anxious or depressed in appearance.  All labs reviewed with patient. Lab Review:  CBC EXTENDED Latest Ref Rng & Units 07/14/2021 12/09/2020 10/15/2020  WBC 3.4 - 10.8 x10E3/uL 8.8 9.0 10.1  RBC 4.14 - 5.80 x10E6/uL 5.49 6.22(H) 5.98(H)  HGB 13.0 - 17.7 g/dL 16.1 17.8(H) 17.3(H)  HCT 37.5 - 51.0 % 47.5 52.1(H) 50.6  PLT 150 - 450 x10E3/uL 207 207 201  NEUTROABS 1.4 - 7.0 x10E3/uL 5.2 6.2 -  LYMPHSABS 0.7 - 3.1 x10E3/uL 2.4 1.9 -    BMP Latest Ref Rng & Units 07/14/2021 12/09/2020 10/15/2020  Glucose 70 - 99 mg/dL 166(H) 71 83  BUN 8 - 27 mg/dL _0 Creatinine 0.76 - 1.27 mg/dL 0.98 1.04 1.17  BUN/Creat Ratio 10 - _1 -  Sodium 134 - 144 mmol/L 142 142 139  Potassium 3.5 - 5.2 mmol/L 4.3 4.4 4.1  Chloride 96 - 106 mmol/L 104 103 104  CO2 20 - 29 mmol/L _2 Calcium 8.6 - 10.2 mg/dL 9.7 9.9 9.4    Hepatic Function Latest Ref Rng & Units 07/14/2021 12/09/2020 06/17/2020  Total Protein 6.0 - 8.5 g/dL 6.2 7.1 6.5  Albumin 3.8 - 4.9 g/dL 4.2 4.6 4.2  AST 0 - 40 IU/L _3 ALT 0 - 44 IU/L _4 Alk Phosphatase 44 - 121 IU/L 61 60 57  Total Bilirubin 0.0 - 1.2 mg/dL 0.4 0.4 0.5  Bilirubin, Direct 0.00 - 0.40 mg/dL 0.15 0.15 0.16    Lab Results  Component Value Date   CHOL 134 07/14/2021   Lab Results  Component Value Date   HDL 38 (L) 07/14/2021   Lab Results  Component Value Date   LDLCALC 59 07/14/2021   Lab Results  Component Value Date   TRIG 231 (H) 07/14/2021   Lab Results  Component Value Date   CHOLHDL 3.5 07/14/2021   No results for input(s): PSA in the last 72 hours. No results found for: HCVAB Lab Results  Component Value Date    VD25OH 28.2 (L) 07/14/2021     Lab Results  Component Value Date   HGBA1C 6.4 (H) 07/14/2021   HGBA1C 6.7 (A) 06/10/2021   HGBA1C 5.9 (A) 02/24/2021   Lab Results  Component Value Date   MICROALBUR 2.82 (H) 07/27/2007   LDLCALC 59 07/14/2021   CREATININE 0.98 07/14/2021     Assessment and Plan:     ICD-10-CM   1. Encounter for health maintenance examination with abnormal findings  Z00.01     2. Diabetic polyneuropathy associated with type 2 diabetes mellitus ([place])  E11.42 Ambulatory referral to Podiatry    3. Foot pain, bilateral  M79.671 Ambulatory referral to Podiatry   M79.672     4. Moderate episode of recurrent major depressive disorder (HCC)  F33.1      [place] is doing better.  He has stopped smoking.  He is lost a tremendous amount of weight.  Blood sugars and blood pressure are great now.  Unfortunately, he is having still a lot of problems with residual strength symptoms after stroke.  He is struggling with this.  Depression is not controlled with some  mildly worse compared to previous.  Long-term Paxil use, we will do a trial of the change to Prozac to see if this helps as below.  He had a number of other different questions, particular his shoulder, and i am gonna have to bring him back to address these further.  He also is having some increased pain with polyneuropathy.  With Lyrica being psychoactive, I would prefer to not do this or make changes until we can reassess his depressive symptoms on Prozac.  Patient Instructions  Paxil drop down to 20 mg twice a day for 2 weeks.  Then drop down to Paxil 20 mg once a day for 1 month  AT THE SAME TIME, start Prozac 20 mg   Health Maintenance Exam: The patient's preventative maintenance and recommended screening tests for an annual wellness exam were reviewed in full today. Brought up to date unless services declined.  Counselled on the importance of diet, exercise, and its role in overall health and  mortality. The patient's FH and SH was reviewed, including their home life, tobacco status, and drug and alcohol status.  Follow-up in 1 year for physical exam or additional follow-up below.  Follow-up: Return in about 6 weeks (around 09/02/2021) for depression and shoulder. Or follow-up in 1 year if not noted.  Meds ordered this encounter  Medications   FLUoxetine (PROZAC) 20 MG capsule    Sig: Take 1 capsule (20 mg total) by mouth daily.    Dispense:  30 capsule    Refill:  2   Medications Discontinued During This Encounter  Medication Reason   PARoxetine (PAXIL) 30 MG tablet    Orders Placed This Encounter  Procedures   Ambulatory referral to Podiatry    Signed,  Sira Adsit T. Laquincy Eastridge, MD   Allergies as of 07/22/2021       Reactions   Cashew Nut Oil Anaphylaxis   Cashews, 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   Pistachio Nut (diagnostic) Hives, Shortness Of Breath, Itching, Swelling, Rash   Latex Dermatitis   Tape Other (See Comments)   Tears and bruises the skin   Betadine [povidone Iodine] Rash, Other (See Comments)   Raised rash 04/23/14   Lidocaine Other (See Comments)   Minimal effect with lidocaine, prefers different anesthetic   Penicillins Rash, Other (See Comments)   Rash all over as a child        Medication List        Accurate as of July 22, 2021 11:59 PM. If you have any questions, ask your nurse or doctor.          STOP taking these medications    PARoxetine 30 MG tablet Commonly known as: PAXIL Stopped by: Owens Loffler, MD       TAKE these medications    acetaminophen 500 MG tablet Commonly known as: TYLENOL Take 1,000 mg by mouth 2 (two) times daily as needed (for pain).   apixaban 5 MG Tabs tablet Commonly known as: Eliquis TAKE 1 TABLET(5 MG) BY MOUTH TWICE DAILY   Chantix Starting Month Pak 0.5 MG X 11 & 1 MG X 42 tablet Generic drug: varenicline Take one 0.40m tablet by mouth once daily for 3 days, then increase to one 0.580m tablet twice daily for 3 days, then increase to one 65m49mablet twice daily.   varenicline 1 MG tablet Commonly known as: CHANTIX TAKE 1 TABLET(1 MG) BY MOUTH TWICE DAILY   Contour Next Test test strip Generic drug: glucose blood USE TO CHECK BLOOD SUGAR  dapagliflozin propanediol 10 MG Tabs tablet Commonly known as: Farxiga Take 1 tablet (10 mg total) by mouth daily.   diltiazem 30 MG tablet Commonly known as: Cardizem Take 1 tablet every 4 hours AS NEEDED for heart rate >100   EPINEPHrine 0.3 mg/0.3 mL Soaj injection Commonly known as: EPI-PEN ADMINISTER 0.3 ML IN THE MUSCLE 1 TIME FOR 1 DOSE   FLUoxetine 20 MG capsule Commonly known as: PROZAC Take 1 capsule (20 mg total) by mouth daily. Started by: Owens Loffler, MD   Lancets Micro Thin 33G Misc Use to check blood sugar two times daily.   lisinopril 40 MG tablet Commonly known as: ZESTRIL TAKE 1 TABLET(40 MG) BY MOUTH DAILY   metFORMIN 500 MG 24 hr tablet Commonly known as: GLUCOPHAGE-XR TAKE 4 TABLETS BY MOUTH DAILY WITH BREAKFAST   multivitamin tablet Take 1 tablet by mouth daily.   pantoprazole 40 MG tablet Commonly known as: PROTONIX Take 1 tablet (40 mg total) by mouth daily.   pentoxifylline 400 MG CR tablet Commonly known as: TRENTAL TAKE 1 TABLET BY MOUTH THREE TIMES DAILY WITH MEALS   pregabalin 75 MG capsule Commonly known as: LYRICA TAKE 1 CAPSULE(75 MG) BY MOUTH THREE TIMES DAILY   repaglinide 0.5 MG tablet Commonly known as: PRANDIN Take 1 tablet (0.5 mg total) by mouth 2 (two) times daily before a meal.   rOPINIRole 0.5 MG tablet Commonly known as: REQUIP Take 1 tablet (0.5 mg total) by mouth 2 (two) times daily.   rosuvastatin 20 MG tablet Commonly known as: CRESTOR TAKE 1 TABLET(20 MG) BY MOUTH AT BEDTIME   Victoza 18 MG/3ML Sopn Generic drug: liraglutide Inject 1.8 mg into the skin daily.

## 2021-07-24 ENCOUNTER — Encounter: Payer: Self-pay | Admitting: Family Medicine

## 2021-07-26 NOTE — Progress Notes (Signed)
[facility] Summary Report / Loop Recorder 

## 2021-07-27 ENCOUNTER — Other Ambulatory Visit: Payer: Self-pay

## 2021-07-27 ENCOUNTER — Ambulatory Visit (INDEPENDENT_AMBULATORY_CARE_PROVIDER_SITE_OTHER): Payer: 59 | Admitting: Podiatry

## 2021-07-27 ENCOUNTER — Ambulatory Visit (INDEPENDENT_AMBULATORY_CARE_PROVIDER_SITE_OTHER): Payer: 59

## 2021-07-27 ENCOUNTER — Encounter: Payer: Self-pay | Admitting: Podiatry

## 2021-07-27 DIAGNOSIS — M19072 Primary osteoarthritis, left ankle and foot: Secondary | ICD-10-CM

## 2021-07-27 DIAGNOSIS — L97521 Non-pressure chronic ulcer of other part of left foot limited to breakdown of skin: Secondary | ICD-10-CM | POA: Diagnosis not present

## 2021-07-27 DIAGNOSIS — E1142 Type 2 diabetes mellitus with diabetic polyneuropathy: Secondary | ICD-10-CM

## 2021-07-27 DIAGNOSIS — M19079 Primary osteoarthritis, unspecified ankle and foot: Secondary | ICD-10-CM

## 2021-07-27 DIAGNOSIS — I739 Peripheral vascular disease, unspecified: Secondary | ICD-10-CM

## 2021-07-27 DIAGNOSIS — M19071 Primary osteoarthritis, right ankle and foot: Secondary | ICD-10-CM | POA: Diagnosis not present

## 2021-07-27 DIAGNOSIS — M79676 Pain in unspecified toe(s): Secondary | ICD-10-CM | POA: Diagnosis not present

## 2021-07-27 DIAGNOSIS — B351 Tinea unguium: Secondary | ICD-10-CM | POA: Diagnosis not present

## 2021-07-27 NOTE — Progress Notes (Signed)
Subjective:  Patient ID: [name] [name], male    DOB: 06/11/1960,  MRN: [id number] HPI Chief Complaint  Patient presents with   Diabetes    Foot Exam - last a1c 6.4, noticing toes curling, 2nd toe rubbing shoes causing wound, toenails thick and dark, some pain medial right foot, history of leg wound from spider bite turned ulcer, PVD   New Patient (Initial Visit)    61 y.o. male presents with the above complaint.   ROS: Denies fever chills nausea vomiting muscle aches pains calf pain back pain chest pain shortness of breath.  Past Medical History:  Diagnosis Date   Angio-edema    Anxiety    Depression    Diabetes mellitus type II    type 2   GERD (gastroesophageal reflux disease)    Headache    migraine   History of kidney stones    HLD (hyperlipidemia)    HTN (hypertension)    Morbid obesity (HCC)    Restless legs syndrome (RLS) 11/20/2012   Stroke ([place]) 02/14/2020   ischemic/right sided affected   Tobacco abuse    Ulcer of left ankle (HCC)    last 2 months dime size dry dressing changing q day   Past Surgical History:  Procedure Laterality Date   Arm surgery Right 1969   Abscess excision and debridement at elbow   COLONOSCOPY WITH PROPOFOL N/A 09/06/2016   Procedure: COLONOSCOPY WITH PROPOFOL;  Surgeon: Doran Stabler, MD;  Location: [facility];  Service: Gastroenterology;  Laterality: N/A;   I & D EXTREMITY Left 05/29/2014   Procedure: IRRIGATION AND DEBRIDEMENT OF LEFT LEG WOUND AND PLACEMENT OF  INTEGRA  AND  VAC;  Surgeon: Theodoro Kos, DO;  Location: [facility];  Service: Plastics;  Laterality: Left;   implantable loop recorder implant  03/06/2020   Medtronic Reveal [place] model [id number] (SN  [id number]) implanted by Dr Rayann Heman for cryptogenic stroke   INCISION AND DRAINAGE OF WOUND Left 04/23/2014   Procedure: IRRIGATION AND DEBRIDEMENT LOWER LEFT LEG WOUND WITH PLACEMENT OF INTEGRA AND VAC;  Surgeon: Theodoro Kos, DO;  Location: [facility];  Service: Plastics;  Laterality: Left;   kidney stone removal  [id number]   MINOR APPLICATION OF WOUND VAC Left 04/23/2014   Procedure: MINOR APPLICATION OF WOUND VAC;  Surgeon: Theodoro Kos, DO;  Location: [facility];  Service: Plastics;  Laterality: Left;   SKIN SPLIT GRAFT Left 06/18/2014   Procedure: SKIN GRAFT SPLIT THICKNESS TO LOWER LEFT LEG WOUND WITH PLACEMENT OF VAC;  Surgeon: Theodoro Kos, DO;  Location: [facility];  Service: Plastics;  Laterality: Left;   VASECTOMY  1994    Current Outpatient Medications:    acetaminophen (TYLENOL) 500 MG tablet, Take 1,000 mg by mouth 2 (two) times daily as needed (for pain)., Disp: , Rfl:    apixaban (ELIQUIS) 5 MG TABS tablet, TAKE 1 TABLET(5 MG) BY MOUTH TWICE DAILY, Disp: 60 tablet, Rfl: 5   dapagliflozin propanediol (FARXIGA) 10 MG TABS tablet, Take 1 tablet (10 mg total) by mouth daily., Disp: 90 tablet, Rfl: 3   diltiazem (CARDIZEM) 30 MG tablet, Take 1 tablet every 4 hours AS NEEDED for heart rate >100, Disp: 30 tablet, Rfl: 1   EPINEPHRINE 0.3 mg/0.3 mL IJ SOAJ injection, ADMINISTER 0.3 ML IN THE MUSCLE 1 TIME FOR 1 DOSE, Disp: 2 each, Rfl: 0   FLUoxetine (PROZAC) 20 MG capsule, Take 1 capsule (20 mg total) by mouth daily., Disp:  30 capsule, Rfl: 2   glucose blood (CONTOUR NEXT TEST) test strip, USE TO CHECK BLOOD SUGAR, Disp: 100 strip, Rfl: 5   Lancets Micro Thin 33G MISC, Use to check blood sugar two times daily., Disp: 100 each, Rfl: 5   liraglutide (VICTOZA) 18 MG/3ML SOPN, Inject 1.8 mg into the skin daily., Disp: 9 mL, Rfl: 3   lisinopril (ZESTRIL) 40 MG tablet, TAKE 1 TABLET(40 MG) BY MOUTH DAILY, Disp: 90 tablet, Rfl: 0   metFORMIN (GLUCOPHAGE-XR) 500 MG 24 hr tablet, TAKE 4 TABLETS BY MOUTH DAILY WITH BREAKFAST, Disp: 360 tablet, Rfl: 1   Multiple Vitamin (MULTIVITAMIN) tablet, Take 1 tablet by mouth daily., Disp: , Rfl:    pantoprazole (PROTONIX) 40 MG tablet, Take 1 tablet (40 mg total)  by mouth daily., Disp: 90 tablet, Rfl: 3   pentoxifylline (TRENTAL) 400 MG CR tablet, TAKE 1 TABLET BY MOUTH THREE TIMES DAILY WITH MEALS, Disp: 270 tablet, Rfl: 3   pregabalin (LYRICA) 75 MG capsule, TAKE 1 CAPSULE(75 MG) BY MOUTH THREE TIMES DAILY, Disp: 270 capsule, Rfl: 1   repaglinide (PRANDIN) 0.5 MG tablet, Take 1 tablet (0.5 mg total) by mouth 2 (two) times daily before a meal., Disp: 60 tablet, Rfl: 11   rOPINIRole (REQUIP) 0.5 MG tablet, Take 1 tablet (0.5 mg total) by mouth 2 (two) times daily., Disp: 180 tablet, Rfl: 3   rosuvastatin (CRESTOR) 20 MG tablet, TAKE 1 TABLET(20 MG) BY MOUTH AT BEDTIME, Disp: 90 tablet, Rfl: 1   varenicline (CHANTIX STARTING MONTH PAK) 0.5 MG X 11 & 1 MG X 42 tablet, Take one 0.5mg  tablet by mouth once daily for 3 days, then increase to one 0.5mg  tablet twice daily for 3 days, then increase to one 1mg  tablet twice daily., Disp: 53 tablet, Rfl: 0   varenicline (CHANTIX) 1 MG tablet, TAKE 1 TABLET(1 MG) BY MOUTH TWICE DAILY, Disp: 60 tablet, Rfl: 3  Allergies  Allergen Reactions   Cashew Nut Oil Anaphylaxis    Cashews, 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   Pistachio Nut (Diagnostic) Hives, Shortness Of Breath, Itching, Swelling and Rash   Latex Dermatitis   Tape Other (See Comments)    Tears and bruises the skin   Betadine [Povidone Iodine] Rash and Other (See Comments)    Raised rash 04/23/14   Lidocaine Other (See Comments)    Minimal effect with lidocaine, prefers different anesthetic   Penicillins Rash and Other (See Comments)    Rash all over as a child   Review of Systems Objective:  There were no vitals filed for this visit.  General: Well developed, nourished, in no acute distress, alert and oriented x3   Dermatological: Skin is warm, dry and supple bilateral. Nails x 10 are thick yellow dystrophic clinically mycotic; remaining integument appears unremarkable at this time. There are no open sores, no preulcerative lesions, no rash or signs of infection  present.  Very superficial open wound measuring about a millimeter in diameter to the dorsal aspect of the hammertoe second left at the PIPJ.  No signs of infection currently.  Vascular: Dorsalis Pedis artery and Posterior Tibial artery pedal pulses are 2/4 bilateral with immedate capillary fill time. Pedal hair growth present. No varicosities and no lower extremity edema present bilateral.  Venous stasis and venous insufficiency to the legs with a history of ulcers and plastic surgery.   Neurologic: Profound neuropathy bilateral.  No vibratory no sensation no Semmes Weinstein monofilament response patellar and Achilles deep tendon reflexes 2+ bilateral. No Babinski or  clonus noted bilateral.   Musculoskeletal: No gross boney pedal deformities bilateral. No pain, crepitus, or limitation noted with foot and ankle range of motion bilateral. Muscular strength 5/5 in all groups tested bilateral.  Is collapse of his right foot at the mid midtarsal joint and significant osteoarthritis.  Mild hammertoe deformities bilateral  Gait: Unassisted, antalgic gait   Radiographs:  Radiographs taken today demonstrate an osseously mature individual with complete collapse of the medial longitudinal arch at the midtarsal joint on the right foot.  Significant tarsometatarsal osteoarthritic changes throughout that foot as well as the first metatarsophalangeal joint and hammertoe deformities.  Contralateral foot demonstrates more of a rectus foot type with some muscle early osteoarthritic changes in the tarsometatarsal joints considerable edema in the legs.  Assessment & Plan:   Assessment: Severe diabetic peripheral neuropathy.  Pain in limb secondary to onychomycosis.  Osteoarthritis and probable Charcot changes right foot primarily.  Hammertoe deformities bilateral.  Superficial wound second toe right foot  Plan: Discussed etiology pathology conservative surgical therapies discussed Charcot arthropathy with them  discussed having their nails trimmed every 3 months.  He will carefully watch the lesion on the second toe which we did place a silicone sheeting over.  We will have him get with [facility] for diabetic shoes will make that appointment.     Jackolyn Geron T. [place], [place]

## 2021-07-29 ENCOUNTER — Telehealth: Payer: Self-pay

## 2021-07-29 ENCOUNTER — Ambulatory Visit: Payer: 59

## 2021-07-29 ENCOUNTER — Other Ambulatory Visit: Payer: Self-pay | Admitting: Family Medicine

## 2021-07-29 ENCOUNTER — Other Ambulatory Visit: Payer: Self-pay

## 2021-07-29 DIAGNOSIS — L97521 Non-pressure chronic ulcer of other part of left foot limited to breakdown of skin: Secondary | ICD-10-CM

## 2021-07-29 DIAGNOSIS — E1142 Type 2 diabetes mellitus with diabetic polyneuropathy: Secondary | ICD-10-CM

## 2021-07-29 NOTE — Progress Notes (Signed)
SITUATION ?Reason for Consult: Evaluation for Prefabricated Diabetic Shoes and Custom Diabetic Inserts. ?Patient / Caregiver Report: Patient would like well fitting shoes ? ?OBJECTIVE DATA: ?Patient History / Diagnosis:  ?  ICD-10-CM   ?1. Diabetic polyneuropathy associated with type 2 diabetes mellitus ([place])  E11.42   ?  ?2. Skin ulcer of second toe of left foot, limited to breakdown of skin ([place])  L97.521   ?  ? ? ?Current or Previous Devices:   Historical user ? ?In-Person Foot Examination: ?Ulcers & Callousing:   Historical ? ?Shoe Size: 13XW ? ?ORTHOTIC RECOMMENDATION ?Recommended Devices: ?- 1x pair prefabricated PDAC approved diabetic shoes; Patient Selected - Orthofeet 672 ? Size 13XW ?- 3x pair custom-to-patient PDAC approved vacuum formed diabetic insoles. ? ?GOALS OF SHOES AND INSOLES ?- Reduce shear and pressure ?- Reduce / Prevent callus formation ?- Reduce / Prevent ulceration ?- Protect the fragile healing compromised diabetic foot. ? ?Patient would benefit from diabetic shoes and inserts as patient has diabetes mellitus and the patient has one or more of the following conditions: ?- History of previous foot ulceration. ?- History of pre-ulcerative callus ?- Peripheral neuropathy with evidence of callus formation ?- Foot deformity ?- Poor circulation ? ?ACTIONS PERFORMED ?Patient was casted for insoles via crush box and measured for shoes via brannock device. Procedure was explained and patient tolerated procedure well. All questions were answered and concerns addressed. ? ?PLAN ?Patient is to be contacted and scheduled for fitting once shoes and insoles have been fabricated and received. ? ?

## 2021-07-29 NOTE — Telephone Encounter (Signed)
Ordered shoes - Orthofeet 672 13XW ?

## 2021-07-29 NOTE — Telephone Encounter (Signed)
Casts sent to [employer] ?

## 2021-07-30 ENCOUNTER — Other Ambulatory Visit: Payer: Self-pay | Admitting: Family Medicine

## 2021-08-12 ENCOUNTER — Telehealth: Payer: Self-pay

## 2021-08-12 NOTE — Telephone Encounter (Signed)
Pts wife sent my chart note to her chart about her husband Anh Mangano; coping my chart note to pts chart. ? ?Dantavious Snowball "[name]"  P [facility] Clinical Pool (supporting Copland, Spencer, MD) 4 hours ago (9:58 AM)  ? ?KS ?Hello Dr Patsy Lager, I am texting you because I?m really concerned with [name].. he has now completed 2 wks of Paxil 20mg  a day and Prozac 20mg  a day and it is not controlling his depression or anxiety. He is either crying or extremely agitated. I actually witnessed him being verbally aggressive to a total stranger while taking him out yesterday. I know we have to taper him off the Paxil, but I don?t think he can wait 2 more weeks for an increase on the Prozac. Even he asks over and over?what is wrong with me. Also, his shingles have returned to the left upper back and is twice the coverage area than before and very painful. Again, his appointment is not for 2 weeks?.what should I do? ? ? ?I called and spoke with (DPR signed)and she said no SI/HI. There are no guns or weapons in the home. Pt does not want to do VV with a counselor.Pt is not having any physical aggression but did get verbally aggressive with a stranger on 08/11/21. Pt has already tapered with the paxil 20 mg. Pt has dropped the paxil 20 mg twice a day for 2 wks and for another 2 wks pt has taken paxil 20 mg once a day. Pt is also taking prozac 20 mg daily. [name] is not sure pt can wait until 09/02/21 for FU appt with Dr [name]. 11/02/21 said pt is either irritated or agitated or feeling depressed and crying. Patsy Lager said she does not feel this is an emergency.Pt has told wife that he feels funny and kim said that was in reference to the shingles breakout on pts shoulder and upper lt back. [name] said is larger area; now 3" x 5" and before when pt had shingles they itched but this time the blister areas which are not draining are painful. Pt is taking tylenol in between the lyrica but is not taking care of pain. Dr [name] had suggested using OTC  lidocaine patches near but not on the breakout area of shingles.[name] said they will continue the tylenol in between the lyrica because pt has reaction or minimal effect from lidocaine. Dr Ermalene Searing said OK to schedule pt with sameday appt with Dr [name] on 08/16/21 and Patsy Lager scheduled appt with Dr 08/18/21 in office on 08/16/21 at 11AM. UC & ED precautions given and Patsy Lager who is a retired 08/18/21 understanding.Sending the note to Dr [name] and Designer, television/film set CMA. Patsy Lager also left the 09/02/21 appt with Dr [name] until sees Dr 11/02/21 on 08/16/21 to see if will need FU that soon and if not Patsy Lager will cancel 09/02/21 appt on Monday. ?

## 2021-08-13 MED ORDER — DIAZEPAM 2 MG PO TABS
2.0000 mg | ORAL_TABLET | Freq: Four times a day (QID) | ORAL | 0 refills | Status: DC | PRN
Start: 2021-08-13 — End: 2021-09-10

## 2021-08-13 MED ORDER — FLUOXETINE HCL 40 MG PO CAPS: 40.0000 mg | ORAL_CAPSULE | Freq: Every day | ORAL | 1 refills | Status: DC

## 2021-08-13 NOTE — Telephone Encounter (Signed)
I spoke to him on this on the telephone and, and this is almost certainly from his changes from Paxil that he has been on for many years titrating off and now changing to Prozac. ? ?Right now, increase Prozac to 40 mg, stop Paxil. ? ?In the immediate time period, I am also going to give him some Valium 2 mg to use for agitation and anxiety.  Give him 1 this morning, then repeat as necessary. ? ?Verbally confirmed all of these more than once with the patient's wife, who is an Charity fundraiser. ? ?They have f/u on Monday. ? ?No diagnosis found. ? ?Meds ordered this encounter  ?Medications  ? FLUoxetine (PROZAC) 40 MG capsule  ?  Sig: Take 1 capsule (40 mg total) by mouth daily.  ?  Dispense:  30 capsule  ?  Refill:  1  ? diazepam (VALIUM) 2 MG tablet  ?  Sig: Take 1 tablet (2 mg total) by mouth every 6 (six) hours as needed for anxiety.  ?  Dispense:  30 tablet  ?  Refill:  0  ? ?  ?

## 2021-08-15 NOTE — Progress Notes (Signed)
? ? ?[name] [name] T. [name] Stirewalt, MD, [facility] ?Therapist, music at [facility] ?[place] ?[place], [address] ? ?Phone: [phone]  FAX: [phone] ? ?[name] [name] - 61 y.o. male  MRN [id number]  Date of Birth: [birth year] ? ?Date: 08/16/2021  PCP: Owens Loffler, MD  Referral: Owens Loffler, MD ? ?Chief Complaint  ?Patient presents with  ? Follow-up  ? ? ?This visit occurred during the SARS-CoV-2 public health emergency.  Safety protocols were in place, including screening questions prior to the visit, additional usage of staff PPE, and extensive cleaning of exam room while observing appropriate contact time as indicated for disinfecting solutions.  ? ?Subjective:  ? ?[name] [name] is a 61 y.o. very pleasant male patient with Body mass index is 43.08 kg/m?. who presents with the following: ? ?F/u severe depression, anxiety. ? ?Recently titrating off of Paxil and then transitioning to Prozac.  Increased to Prozac 40 mg last Friday. ?Complicated by multiple strokes with some cognitive impairment post-stroke. ?- Had been on Paxil 60 mg - Billie Beane put him on it.  ? ?Felt like he was pancking earlier.  ?Sort fuse and crying all the time and a lot of anxiety. ? ?Feels worse period, neuropathy. ?More sensations are more pronounced. ? ?Prn Valium has helped some.  ? ?See below ? ?Review of Systems is noted in the HPI, as appropriate ? ?Objective:  ? ?BP 114/78   Pulse 81   Temp 97.8 ?F (36.6 ?C) (Oral)   Ht 5' 9.5" (1.765 m)   Wt 296 lb (134.3 kg)   SpO2 97%   BMI 43.08 kg/m?  ? ?GEN: No acute distress; alert,appropriate. ?PULM: Breathing comfortably in no respiratory distress ?PSYCH: Labile, occasional crying ?Laboratory and Imaging Data: ? ?Assessment and Plan:  ? ?  ICD-10-CM   ?1. Moderate episode of recurrent major depressive disorder (HCC)  F33.1   ?  ?2. Ischemic stroke ([place]), occipital  I63.9   ?  ?3. Generalized anxiety disorder  F41.1   ?  ? ?Total encounter time:  30 minutes. This includes total time spent on the day of encounter.  He is really not doing well.  He has not done it well in general after his multiple strokes.  Has had some baseline depression, some of which has been doing poorly for some time.  He is done very much not okay after we decreased his Paxil dosing 1 month ago.  Last Friday, talk to his wife, and we increased this to 40 mg of Prozac and discontinued his Paxil. ? ?He also has been acutely anxious, and I did give him some as needed Valium to take over the weekend.  He has only been taking 1 mg, this does seem to help. ? ?I do think we need to escalate his Prozac dosing.  He and his wife are interesting in doing some counseling, hopefully with some specifics post stroke counseling. ? ?In addition to continuing his Prozac at 40 mg, I am going to add in some scheduled Klonopin right now for acute stabilization.  In 2 weeks increase to Prozac 60. ? ?He is going to follow-up with me in 3 weeks. ? ?Meds ordered this encounter  ?Medications  ? clonazePAM (KLONOPIN) 0.5 MG tablet  ?  Sig: Take 0.5-1 tablets (0.25-0.5 mg total) by mouth 2 (two) times daily as needed for anxiety.  ?  Dispense:  60 tablet  ?  Refill:  0  ? FLUoxetine (PROZAC) 20 MG capsule  ?  Sig: Take 1 capsule (20 mg total) by mouth daily.  ?  Dispense:  30 capsule  ?  Refill:  1  ? ?Medications Discontinued During This Encounter  ?Medication Reason  ? varenicline (CHANTIX STARTING MONTH PAK) 0.5 MG X 11 & 1 MG X 42 tablet   ? ?No orders of the defined types were placed in this encounter. ? ? ?Follow-up: No follow-ups on file. ? ?Dragon [employer] One speech-to-text software was used for transcription in this dictation.  Possible transcriptional errors can occur using Editor, commissioning.  ? ?Signed, ? ?[name] Catanzaro T. Kimoni Pickerill, MD ? ? ?Outpatient Encounter Medications as of 08/16/2021  ?Medication Sig  ? acetaminophen (TYLENOL) 500 MG tablet Take 1,000 mg by mouth 2 (two) times daily as needed (for pain).   ? apixaban (ELIQUIS) 5 MG TABS tablet TAKE 1 TABLET(5 MG) BY MOUTH TWICE DAILY  ? clonazePAM (KLONOPIN) 0.5 MG tablet Take 0.5-1 tablets (0.25-0.5 mg total) by mouth 2 (two) times daily as needed for anxiety.  ? dapagliflozin propanediol (FARXIGA) 10 MG TABS tablet Take 1 tablet (10 mg total) by mouth daily.  ? diazepam (VALIUM) 2 MG tablet Take 1 tablet (2 mg total) by mouth every 6 (six) hours as needed for anxiety.  ? diltiazem (CARDIZEM) 30 MG tablet Take 1 tablet every 4 hours AS NEEDED for heart rate >100  ? EPINEPHRINE 0.3 mg/0.3 mL IJ SOAJ injection ADMINISTER 0.3 ML IN THE MUSCLE 1 TIME FOR 1 DOSE  ? FLUoxetine (PROZAC) 20 MG capsule Take 1 capsule (20 mg total) by mouth daily.  ? FLUoxetine (PROZAC) 40 MG capsule Take 1 capsule (40 mg total) by mouth daily.  ? glucose blood (CONTOUR NEXT TEST) test strip USE TO CHECK BLOOD SUGAR  ? Lancets Micro Thin 33G MISC Use to check blood sugar two times daily.  ? liraglutide (VICTOZA) 18 MG/3ML SOPN Inject 1.8 mg into the skin daily.  ? lisinopril (ZESTRIL) 40 MG tablet TAKE 1 TABLET(40 MG) BY MOUTH DAILY  ? metFORMIN (GLUCOPHAGE-XR) 500 MG 24 hr tablet TAKE 4 TABLETS BY MOUTH DAILY WITH BREAKFAST  ? Multiple Vitamin (MULTIVITAMIN) tablet Take 1 tablet by mouth daily.  ? pantoprazole (PROTONIX) 40 MG tablet TAKE 1 TABLET(40 MG) BY MOUTH DAILY  ? pentoxifylline (TRENTAL) 400 MG CR tablet TAKE 1 TABLET BY MOUTH THREE TIMES DAILY WITH MEALS  ? pregabalin (LYRICA) 75 MG capsule TAKE 1 CAPSULE(75 MG) BY MOUTH THREE TIMES DAILY  ? repaglinide (PRANDIN) 0.5 MG tablet Take 1 tablet (0.5 mg total) by mouth 2 (two) times daily before a meal.  ? rOPINIRole (REQUIP) 0.5 MG tablet TAKE 1 TABLET(0.5 MG) BY MOUTH TWICE DAILY  ? rosuvastatin (CRESTOR) 20 MG tablet TAKE 1 TABLET(20 MG) BY MOUTH AT BEDTIME  ? varenicline (CHANTIX) 1 MG tablet TAKE 1 TABLET(1 MG) BY MOUTH TWICE DAILY  ? [DISCONTINUED] varenicline (CHANTIX STARTING MONTH PAK) 0.5 MG X 11 & 1 MG X 42 tablet Take one  0.5mg  tablet by mouth once daily for 3 days, then increase to one 0.5mg  tablet twice daily for 3 days, then increase to one 1mg  tablet twice daily.  ? ?No facility-administered encounter medications on file as of 08/16/2021.  ?  ?

## 2021-08-16 ENCOUNTER — Ambulatory Visit (INDEPENDENT_AMBULATORY_CARE_PROVIDER_SITE_OTHER): Payer: 59 | Admitting: Family Medicine

## 2021-08-16 ENCOUNTER — Encounter: Payer: Self-pay | Admitting: Family Medicine

## 2021-08-16 ENCOUNTER — Other Ambulatory Visit: Payer: Self-pay

## 2021-08-16 VITALS — BP 114/78 | HR 81 | Temp 97.8°F | Ht 69.5 in | Wt 296.0 lb

## 2021-08-16 DIAGNOSIS — F331 Major depressive disorder, recurrent, moderate: Secondary | ICD-10-CM

## 2021-08-16 DIAGNOSIS — I639 Cerebral infarction, unspecified: Secondary | ICD-10-CM

## 2021-08-16 DIAGNOSIS — F411 Generalized anxiety disorder: Secondary | ICD-10-CM

## 2021-08-16 MED ORDER — CLONAZEPAM 0.5 MG PO TABS: 0.2500 mg | ORAL_TABLET | Freq: Two times a day (BID) | ORAL | 0 refills | Status: DC | PRN

## 2021-08-16 MED ORDER — FLUOXETINE HCL 20 MG PO CAPS: 20.0000 mg | ORAL_CAPSULE | Freq: Every day | ORAL | 1 refills | Status: DC

## 2021-08-16 NOTE — Patient Instructions (Signed)
Clonazepam start with 1/2 a tablet twice a day ?- it is ok to increase to a full tablet twice a day ? ?In 2 weeks, add the Prozac 20 mg capsule (Keep taking the 40 mg capsule) ?

## 2021-08-23 ENCOUNTER — Ambulatory Visit (INDEPENDENT_AMBULATORY_CARE_PROVIDER_SITE_OTHER): Payer: 59

## 2021-08-23 DIAGNOSIS — I639 Cerebral infarction, unspecified: Secondary | ICD-10-CM

## 2021-08-24 LAB — CUP PACEART REMOTE DEVICE CHECK
Date Time Interrogation Session: 20230319231628
Implantable Pulse Generator Implant Date: 20211008

## 2021-08-25 ENCOUNTER — Telehealth: Payer: Self-pay

## 2021-08-25 NOTE — Telephone Encounter (Signed)
Returning patients phone call. Spoke to [employer] patients wife who is on [place].  ? ?ILR report has not been reviewed by Dr. Johney Frame as of yet.  ? ?Luanna Cole that I have reviewed the remote transmission and noted AF burden is 0.4%, which is not a new finding and patient is on [facility] which is appropriate treatment.  Patient has been asymptomatic which is also reassuring. Advised that Dr. Johney Frame will review and if he wishes to make changes we will call to make those changes.  Advised if they have any further questions or concerns to please call, we will be happy to help. Appreciative of call.  ?

## 2021-08-25 NOTE — Telephone Encounter (Signed)
The patient wife would like for someone to give her a call to discuss the patient last summary report. She saw what was in my-chart and is very concerned. Her phone number is [phone]. ?

## 2021-08-26 ENCOUNTER — Ambulatory Visit: Payer: 59

## 2021-08-28 ENCOUNTER — Other Ambulatory Visit: Payer: Self-pay | Admitting: Family Medicine

## 2021-08-29 ENCOUNTER — Other Ambulatory Visit: Payer: Self-pay | Admitting: Family Medicine

## 2021-08-29 NOTE — Telephone Encounter (Signed)
Last office visit 08/16/21 for MDD/GAD/Ischemic stroke.  Last refilled 05/09/21 for #60 with 3 refills.  Next Appt: 09/02/21 for MDD & Shoulder. ?

## 2021-08-30 ENCOUNTER — Ambulatory Visit (INDEPENDENT_AMBULATORY_CARE_PROVIDER_SITE_OTHER): Payer: 59

## 2021-08-30 DIAGNOSIS — E1142 Type 2 diabetes mellitus with diabetic polyneuropathy: Secondary | ICD-10-CM | POA: Diagnosis not present

## 2021-08-30 DIAGNOSIS — I739 Peripheral vascular disease, unspecified: Secondary | ICD-10-CM

## 2021-08-30 DIAGNOSIS — L97521 Non-pressure chronic ulcer of other part of left foot limited to breakdown of skin: Secondary | ICD-10-CM

## 2021-08-30 NOTE — Progress Notes (Signed)
SITUATION ?Reason for Visit: Fitting of Diabetic Carrier ?Patient / Caregiver Report:  Patient is satisfied with fit and function of shoes and insoles. ? ?OBJECTIVE DATA: ?Patient History / Diagnosis:   ?  ICD-10-CM   ?1. Diabetic polyneuropathy associated with type 2 diabetes mellitus ([place])  E11.42   ?  ?2. PVD (peripheral vascular disease) (HCC)  I73.9   ?  ?3. Skin ulcer of second toe of left foot, limited to breakdown of skin ([place])  L97.521   ?  ? ? ?Change in Status:   None ? ?ACTIONS PERFORMED: ?In-Person Delivery, patient was fit with: ?- 1x pair [id number] PDAC approved prefabricated Diabetic Shoes: Orthofeet 672 [employer] Size 13XW ?- 1x pair [id number] PDAC approved vacuum formed custom diabetic insoles; [employer]: [id number] (2x pair waiting on manufacturer) ? ?Shoes and insoles were verified for structural integrity and safety. Patient wore shoes and insoles in office. Skin was inspected and free of areas of concern after wearing shoes and inserts. Shoes and inserts fit properly. Patient / Caregiver provided with ferbal instruction and demonstration regarding donning, doffing, wear, care, proper fit, function, purpose, cleaning, and use of shoes and insoles ' and in all related precautions and risks and benefits regarding shoes and insoles. Patient / Caregiver was instructed to wear properly fitting socks with shoes at all times. Patient was also provided with verbal instruction regarding how to report any failures or malfunctions of shoes or inserts, and necessary follow up care. Patient / Caregiver was also instructed to contact physician regarding change in status that may affect function of shoes and inserts.  ? ?Patient / Caregiver verbalized undersatnding of instruction provided. Patient / Caregiver demonstrated independence with proper donning and doffing of shoes and inserts. ? ?PLAN ?Patient to follow with treating physician as recommended. Plan of care was discussed with and agreed upon by patient  and/or caregiver. All questions were answered and concerns addressed. ? ?

## 2021-08-31 ENCOUNTER — Ambulatory Visit: Payer: Self-pay | Admitting: Neurology

## 2021-08-31 ENCOUNTER — Ambulatory Visit: Payer: 59

## 2021-09-02 ENCOUNTER — Encounter: Payer: Self-pay | Admitting: Family Medicine

## 2021-09-02 ENCOUNTER — Ambulatory Visit (INDEPENDENT_AMBULATORY_CARE_PROVIDER_SITE_OTHER): Payer: 59 | Admitting: Family Medicine

## 2021-09-02 ENCOUNTER — Ambulatory Visit: Payer: 59 | Admitting: Family Medicine

## 2021-09-02 VITALS — BP 106/60 | HR 73 | Temp 97.9°F | Ht 69.5 in | Wt 301.1 lb

## 2021-09-02 DIAGNOSIS — F411 Generalized anxiety disorder: Secondary | ICD-10-CM

## 2021-09-02 DIAGNOSIS — I639 Cerebral infarction, unspecified: Secondary | ICD-10-CM | POA: Diagnosis not present

## 2021-09-02 DIAGNOSIS — F331 Major depressive disorder, recurrent, moderate: Secondary | ICD-10-CM | POA: Diagnosis not present

## 2021-09-02 DIAGNOSIS — G8929 Other chronic pain: Secondary | ICD-10-CM

## 2021-09-02 DIAGNOSIS — M25511 Pain in right shoulder: Secondary | ICD-10-CM

## 2021-09-02 DIAGNOSIS — E1142 Type 2 diabetes mellitus with diabetic polyneuropathy: Secondary | ICD-10-CM

## 2021-09-02 MED ORDER — PREGABALIN 75 MG PO CAPS: ORAL_CAPSULE | ORAL | 1 refills | Status: DC

## 2021-09-02 NOTE — Progress Notes (Signed)
? ? ?[name] Walraven T. Reygan Heagle, MD, [facility] ?Therapist, music at [facility] ?[place] ?[place], [address] ? ?Phone: [phone]  FAX: [phone] ? ?[name] [name] - 61 y.o. male  MRN [id number]  Date of Birth: 1960/12/25 ? ?Date: 09/02/2021  PCP: Owens Loffler, MD  Referral: Owens Loffler, MD ? ?Chief Complaint  ?Patient presents with  ? Follow-up  ?  Depression & Right Shoulder  ? ? ?This visit occurred during the SARS-CoV-2 public health emergency.  Safety protocols were in place, including screening questions prior to the visit, additional usage of staff PPE, and extensive cleaning of exam room while observing appropriate contact time as indicated for disinfecting solutions.  ? ?Subjective:  ? ?[name] [name] is a 61 y.o. very pleasant male patient with Body mass index is 43.83 kg/m?. who presents with the following: ? ?F/u depression: ? ?Now on Prozac 60 mg and I placed him on scheduled Klonopin for now. ?When he had been doing before, I saw him and he was doing quite poorly.  At that point we fairly aggressively pushed up his Prozac.  Now on 60 mg he is feeling better, better than he was on his Zoloft.  He is not fully improved.  He has only been on the 60 mg of Prozac for roughly 3 weeks. ?Has been doing better than before, but he is not back to normal.  ? ?Dep seems better.  ?Sleeping better.  ?Anx doing better.  ? ?R RTC, tore it about 5 years ago. ?He also has some weakness in the shoulder, and he has had painful internal range of motion. ?Painful arc ? ? ?Review of Systems is noted in the HPI, as appropriate ? ?Objective:  ? ?BP 106/60   Pulse 73   Temp 97.9 ?F (36.6 ?C) (Oral)   Ht 5' 9.5" (1.765 m)   Wt (!) 301 lb 2 oz (136.6 kg)   SpO2 95%   BMI 43.83 kg/m?  ? ?GEN: No acute distress; alert,appropriate. ?PULM: Breathing comfortably in no respiratory distress ?PSYCH: Normally interactive.  Affect is full.  He is not labile.  He does not appear anxious, and  he is not crying. ? ?Right shoulder nontender along clavicle, humerus, other bony anatomy.  Does have a painful arc of motion, he does have approaching full motion.  Strength testing on the right does have 4 -/5 in the plane of abduction, ?Internal range of motion is 4+/5 and external noted motion is 4/5 ?Hester Mates, Neer testing is all inducing of pain. ? ?Laboratory and Imaging Data: ? ?Assessment and Plan:  ? ?  ICD-10-CM   ?1. Moderate episode of recurrent major depressive disorder (HCC)  F33.1   ?  ?2. Generalized anxiety disorder  F41.1   ?  ?3. Ischemic stroke ([place]), occipital  I63.9   ?  ?4. Chronic right shoulder pain  M25.511   ? G89.29   ?  ? ?Depression and anxiety have improved, but there has not been enough time on SSRI to get maximal effect. ? ?Right shoulder pain.  I would continue with just basic range of motion, basic activity.  Tylenol as needed. ? ?Polyneuropathy of lower extremities.  This is been challenged for him and have some chronic pain here.  He has done pretty well with Lyrica, so we are going to increase his dose. ? ?Meds ordered this encounter  ?Medications  ? pregabalin (LYRICA) 75 MG capsule  ?  Sig: 1 cap po qam, 1 cap  po afternoon, and 2 caps po qhs  ?  Dispense:  360 capsule  ?  Refill:  1  ? ?Medications Discontinued During This Encounter  ?Medication Reason  ? pregabalin (LYRICA) 75 MG capsule   ? ?No orders of the defined types were placed in this encounter. ? ? ?Follow-up: Return in about 6 weeks (around 10/14/2021). ? ?Dragon Medical One speech-to-text software was used for transcription in this dictation.  Possible transcriptional errors can occur using Editor, commissioning.  ? ?Signed, ? ?Yeng Frankie T. Anorah Trias, MD ? ? ?Outpatient Encounter Medications as of 09/02/2021  ?Medication Sig  ? acetaminophen (TYLENOL) 500 MG tablet Take 1,000 mg by mouth 2 (two) times daily as needed (for pain).  ? apixaban (ELIQUIS) 5 MG TABS tablet TAKE 1 TABLET(5 MG) BY MOUTH TWICE DAILY  ?  clonazePAM (KLONOPIN) 0.5 MG tablet Take 0.5-1 tablets (0.25-0.5 mg total) by mouth 2 (two) times daily as needed for anxiety.  ? dapagliflozin propanediol (FARXIGA) 10 MG TABS tablet Take 1 tablet (10 mg total) by mouth daily.  ? diazepam (VALIUM) 2 MG tablet Take 1 tablet (2 mg total) by mouth every 6 (six) hours as needed for anxiety.  ? diltiazem (CARDIZEM) 30 MG tablet Take 1 tablet every 4 hours AS NEEDED for heart rate >100  ? EPINEPHRINE 0.3 mg/0.3 mL IJ SOAJ injection ADMINISTER 0.3 ML IN THE MUSCLE 1 TIME FOR 1 DOSE  ? FLUoxetine (PROZAC) 20 MG capsule Take 1 capsule (20 mg total) by mouth daily.  ? FLUoxetine (PROZAC) 40 MG capsule Take 1 capsule (40 mg total) by mouth daily.  ? glucose blood (CONTOUR NEXT TEST) test strip USE TO CHECK BLOOD SUGAR  ? Lancets Micro Thin 33G MISC Use to check blood sugar two times daily.  ? liraglutide (VICTOZA) 18 MG/3ML SOPN Inject 1.8 mg into the skin daily.  ? lisinopril (ZESTRIL) 40 MG tablet TAKE 1 TABLET(40 MG) BY MOUTH DAILY  ? metFORMIN (GLUCOPHAGE-XR) 500 MG 24 hr tablet TAKE 4 TABLETS BY MOUTH DAILY WITH BREAKFAST  ? Multiple Vitamin (MULTIVITAMIN) tablet Take 1 tablet by mouth daily.  ? pantoprazole (PROTONIX) 40 MG tablet TAKE 1 TABLET(40 MG) BY MOUTH DAILY  ? pentoxifylline (TRENTAL) 400 MG CR tablet TAKE 1 TABLET BY MOUTH THREE TIMES DAILY WITH MEALS  ? repaglinide (PRANDIN) 0.5 MG tablet Take 1 tablet (0.5 mg total) by mouth 2 (two) times daily before a meal.  ? rOPINIRole (REQUIP) 0.5 MG tablet TAKE 1 TABLET(0.5 MG) BY MOUTH TWICE DAILY  ? rosuvastatin (CRESTOR) 20 MG tablet TAKE 1 TABLET(20 MG) BY MOUTH AT BEDTIME  ? varenicline (CHANTIX) 1 MG tablet TAKE 1 TABLET(1 MG) BY MOUTH TWICE DAILY  ? [DISCONTINUED] pregabalin (LYRICA) 75 MG capsule TAKE 1 CAPSULE(75 MG) BY MOUTH THREE TIMES DAILY  ? pregabalin (LYRICA) 75 MG capsule 1 cap po qam, 1 cap po afternoon, and 2 caps po qhs  ? ?No facility-administered encounter medications on file as of 09/02/2021.  ?  ?

## 2021-09-02 NOTE — Progress Notes (Signed)
[facility] Summary Report / Loop Recorder 

## 2021-09-02 NOTE — Patient Instructions (Signed)
Keep taking Prozac 60 mg. ? ?In 2 weeks, decrease the Clonazepam to 1/2 tablet BID. ?

## 2021-09-06 ENCOUNTER — Other Ambulatory Visit: Payer: Self-pay | Admitting: Family Medicine

## 2021-09-09 ENCOUNTER — Other Ambulatory Visit: Payer: Self-pay | Admitting: Family Medicine

## 2021-09-09 ENCOUNTER — Other Ambulatory Visit: Payer: Self-pay | Admitting: Nurse Practitioner

## 2021-09-09 NOTE — Telephone Encounter (Signed)
Last office visit 09/02/2021 for MDD & Right Shoulder.  Last refilled 08/16/2021 for #60 with no refills.  Next Appt: 05/18/023 for 6 week follow up.  ?

## 2021-09-16 ENCOUNTER — Ambulatory Visit (INDEPENDENT_AMBULATORY_CARE_PROVIDER_SITE_OTHER): Payer: 59 | Admitting: Endocrinology

## 2021-09-16 ENCOUNTER — Encounter: Payer: Self-pay | Admitting: Endocrinology

## 2021-09-16 VITALS — BP 134/72 | HR 94 | Ht 69.5 in | Wt 301.8 lb

## 2021-09-16 DIAGNOSIS — L97929 Non-pressure chronic ulcer of unspecified part of left lower leg with unspecified severity: Secondary | ICD-10-CM

## 2021-09-16 DIAGNOSIS — E1159 Type 2 diabetes mellitus with other circulatory complications: Secondary | ICD-10-CM | POA: Diagnosis not present

## 2021-09-16 DIAGNOSIS — E11622 Type 2 diabetes mellitus with other skin ulcer: Secondary | ICD-10-CM

## 2021-09-16 LAB — POCT GLYCOSYLATED HEMOGLOBIN (HGB A1C): Hemoglobin A1C: 6.4 % — AB (ref 4.0–5.6)

## 2021-09-16 MED ORDER — TRULICITY 4.5 MG/0.5ML ~~LOC~~ SOAJ: 4.5000 mg | SUBCUTANEOUS | 1 refills | Status: DC

## 2021-09-16 NOTE — Progress Notes (Signed)
? ?Subjective:  ? ? Patient ID: [name] [name], male    DOB: [birth year], 61 y.o.   MRN: [id number] ? ?HPI ?Pt returns for f/u of diabetes mellitus:  ?DM type: 2 ?Dx'ed: 2010 ?Complications: PN, CVA's, Charcot foot, and foot ulcers.   ?Therapy: Trulicity and 3 oral meds.   ?DKA: never ?Severe hypoglycemia: once, in 2016.   ?Pancreatitis: never ?Pancreatic imaging: never ?SDOH: wife provides most of hx, due to pt's difficulty with concentration.   ?Other: he has never been on insulin; wife is nurse, and pt has also administered insulin to his father; edema limits rx options; he did not tolerate welchol (constipation) or bromocriptine (ED).  ?Interval history: no cbg record, but states cbg's are well-controlled.  Pt is here with wife today.   ?Past Medical History:  ?Diagnosis Date  ? Angio-edema   ? Anxiety   ? Depression   ? Diabetes mellitus type II   ? type 2  ? GERD (gastroesophageal reflux disease)   ? Headache   ? migraine  ? History of kidney stones   ? HLD (hyperlipidemia)   ? HTN (hypertension)   ? Morbid obesity ([place])   ? Restless legs syndrome (RLS) 11/20/2012  ? Stroke ([place]) 02/14/2020  ? ischemic/right sided affected  ? Tobacco abuse   ? Ulcer of left ankle ([place])   ? last 2 months dime size dry dressing changing q day  ? ? ?Past Surgical History:  ?Procedure Laterality Date  ? Arm surgery Right 1969  ? Abscess excision and debridement at elbow  ? COLONOSCOPY WITH PROPOFOL N/A 09/06/2016  ? Procedure: COLONOSCOPY WITH PROPOFOL;  Surgeon: Doran Stabler, MD;  Location: [facility];  Service: Gastroenterology;  Laterality: N/A;  ? I & D EXTREMITY Left 05/29/2014  ? Procedure: IRRIGATION AND DEBRIDEMENT OF LEFT LEG WOUND AND PLACEMENT OF  INTEGRA  AND  VAC;  Surgeon: Theodoro Kos, DO;  Location: [facility];  Service: Plastics;  Laterality: Left;  ? implantable loop recorder implant  03/06/2020  ? Medtronic Reveal Dixon model [id number] ([place]  [id number]) implanted by Dr Rayann Heman for cryptogenic  stroke  ? INCISION AND DRAINAGE OF WOUND Left 04/23/2014  ? Procedure: IRRIGATION AND DEBRIDEMENT LOWER LEFT LEG WOUND WITH PLACEMENT OF INTEGRA AND VAC;  Surgeon: Theodoro Kos, DO;  Location: [facility];  Service: Plastics;  Laterality: Left;  ? kidney stone removal  2004  ? MINOR APPLICATION OF WOUND VAC Left 04/23/2014  ? Procedure: MINOR APPLICATION OF WOUND VAC;  Surgeon: Theodoro Kos, DO;  Location: [facility];  Service: Plastics;  Laterality: Left;  ? SKIN SPLIT GRAFT Left 06/18/2014  ? Procedure: SKIN GRAFT SPLIT THICKNESS TO LOWER LEFT LEG WOUND WITH PLACEMENT OF VAC;  Surgeon: Theodoro Kos, DO;  Location: [facility];  Service: Plastics;  Laterality: Left;  ? VASECTOMY  1994  ? ? ?Social History  ? ?Socioeconomic History  ? Marital status: Married  ?  Spouse name: nickson dimare  ? Number of children: 3  ? Years of education: Not on file  ? Highest education level: Not on file  ?Occupational History  ? Occupation: Administrator, arts  ?  Comment: no longer working 07/07/20  ?Tobacco Use  ? Smoking status: Some Days  ?  Packs/day: 0.25  ?  Years: 40.00  ?  Pack years: 10.00  ?  Types: Cigarettes  ? Smokeless tobacco: Never  ? Tobacco comments:  ?  half pack daily  ?  Substance and Sexual Activity  ? Alcohol use: Not Currently  ?  Alcohol/week: 0.0 standard drinks  ?  Comment: rarely  ? Drug use: No  ? Sexual activity: Yes  ?  Partners: Female  ?Other Topics Concern  ? Not on file  ?Social History Narrative  ? Lives with wife  ? Right Handed  ? Drink 6-7 cups caffeine daily  ? ?Social Determinants of Health  ? ?Financial Resource Strain: Not on file  ?Food Insecurity: Not on file  ?Transportation Needs: Not on file  ?Physical Activity: Not on file  ?Stress: Not on file  ?Social Connections: Not on file  ?Intimate Partner Violence: Not on file  ? ? ?Current Outpatient Medications on File Prior to Visit  ?Medication Sig Dispense Refill  ? acetaminophen  (TYLENOL) 500 MG tablet Take 1,000 mg by mouth 2 (two) times daily as needed (for pain).    ? apixaban (ELIQUIS) 5 MG TABS tablet TAKE 1 TABLET(5 MG) BY MOUTH TWICE DAILY 60 tablet 5  ? clonazePAM (KLONOPIN) 0.5 MG tablet TAKE 1/2 TO 1 TABLET(0.25 TO 0.5 MG) BY MOUTH TWICE DAILY AS NEEDED FOR ANXIETY 60 tablet 0  ? dapagliflozin propanediol (FARXIGA) 10 MG TABS tablet Take 1 tablet (10 mg total) by mouth daily. 90 tablet 3  ? diltiazem (CARDIZEM) 30 MG tablet TAKE 1 TABLET BY MOUTH EVERY 4 HOURS AS NEEDED FOR HEART RATE>100. Appointment Required For Further Refills [phone] 30 tablet 1  ? EPINEPHRINE 0.3 mg/0.3 mL IJ SOAJ injection ADMINISTER 0.3 ML IN THE MUSCLE 1 TIME FOR 1 DOSE 2 each 0  ? FLUoxetine (PROZAC) 20 MG capsule Take 1 capsule (20 mg total) by mouth daily. 30 capsule 1  ? FLUoxetine (PROZAC) 40 MG capsule TAKE 1 CAPSULE(40 MG) BY MOUTH DAILY 90 capsule 1  ? glucose blood (CONTOUR NEXT TEST) test strip USE TO CHECK BLOOD SUGAR 100 strip 5  ? Lancets Micro Thin 33G MISC Use to check blood sugar two times daily. 100 each 5  ? lisinopril (ZESTRIL) 40 MG tablet TAKE 1 TABLET(40 MG) BY MOUTH DAILY 90 tablet 3  ? metFORMIN (GLUCOPHAGE-XR) 500 MG 24 hr tablet TAKE 4 TABLETS BY MOUTH DAILY WITH BREAKFAST 360 tablet 3  ? Multiple Vitamin (MULTIVITAMIN) tablet Take 1 tablet by mouth daily.    ? pantoprazole (PROTONIX) 40 MG tablet TAKE 1 TABLET(40 MG) BY MOUTH DAILY 90 tablet 3  ? pentoxifylline (TRENTAL) 400 MG CR tablet TAKE 1 TABLET BY MOUTH THREE TIMES DAILY WITH MEALS 270 tablet 3  ? pregabalin (LYRICA) 75 MG capsule 1 cap po qam, 1 cap po afternoon, and 2 caps po qhs 360 capsule 1  ? repaglinide (PRANDIN) 0.5 MG tablet Take 1 tablet (0.5 mg total) by mouth 2 (two) times daily before a meal. 60 tablet 11  ? rOPINIRole (REQUIP) 0.5 MG tablet TAKE 1 TABLET(0.5 MG) BY MOUTH TWICE DAILY 180 tablet 3  ? rosuvastatin (CRESTOR) 20 MG tablet TAKE 1 TABLET(20 MG) BY MOUTH AT BEDTIME 90 tablet 1  ? varenicline  (CHANTIX) 1 MG tablet TAKE 1 TABLET(1 MG) BY MOUTH TWICE DAILY 60 tablet 3  ? ?No current facility-administered medications on file prior to visit.  ? ? ?Allergies  ?Allergen Reactions  ? Cashew Nut Oil Anaphylaxis  ?  Cashews, 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  ? Pistachio Nut (Diagnostic) Hives, Shortness Of Breath, Itching, Swelling and Rash  ? Latex Dermatitis  ? Tape Other (See Comments)  ?  Tears and bruises the skin  ? Betadine [  Povidone Iodine] Rash and Other (See Comments)  ?  Raised rash 04/23/14  ? Lidocaine Other (See Comments)  ?  Minimal effect with lidocaine, prefers different anesthetic  ? Penicillins Rash and Other (See Comments)  ?  Rash all over as a child  ? ? ?Family History  ?Problem Relation Age of Onset  ? Stroke Mother   ?     multiple  ? Leukemia Mother   ? Pneumonia Mother   ? Cancer Mother   ? Diabetes Mother   ? Diabetes Father   ? Heart disease Father   ? Hyperlipidemia Father   ? Hypertension Father   ? Allergic rhinitis Neg Hx   ? Angioedema Neg Hx   ? Asthma Neg Hx   ? Eczema Neg Hx   ? Immunodeficiency Neg Hx   ? Urticaria Neg Hx   ? ? ?BP 134/72 (BP Location: Left Arm, Patient Position: Sitting, Cuff Size: Normal)   Pulse 94   Ht 5' 9.5" (1.765 m)   Wt (!) 301 lb 12.8 oz (136.9 kg)   SpO2 94%   BMI 43.93 kg/m?  ? ? ?Review of Systems ?Nausea is mild.   ?   ?Objective:  ? Physical Exam ?VITAL SIGNS:  See vs page.   ?GENERAL: no distress.  ? ? ?Lab Results  ?Component Value Date  ? HGBA1C 6.4 (A) 09/16/2021  ? ?   ?Assessment & Plan:  ?Type 2 DM: well-controlled ?Nausea, due to Trulicity.  We discussed.  We decide to continue.   ? ?Patient Instructions  ?continue the same 4 diabetes medications.   ?check your blood sugar once a day.  vary the time of day when you check, between before the 3 meals, and at bedtime.  also check if you have symptoms of your blood sugar being too high or too low.  please keep a record of the readings and bring it to your next appointment here (or you can bring  the meter itself).  You can write it on any piece of paper.  please call us sooner if your blood sugar goes below 70, or if you have a lot of readings over 200.   ?Please come back for a follow-up endocrinology ap

## 2021-09-16 NOTE — Patient Instructions (Addendum)
continue the same 4 diabetes medications.   ?check your blood sugar once a day.  vary the time of day when you check, between before the 3 meals, and at bedtime.  also check if you have symptoms of your blood sugar being too high or too low.  please keep a record of the readings and bring it to your next appointment here (or you can bring the meter itself).  You can write it on any piece of paper.  please call us sooner if your blood sugar goes below 70, or if you have a lot of readings over 200.   ?Please come back for a follow-up endocrinology appointment in 4 months.   ?

## 2021-09-19 ENCOUNTER — Other Ambulatory Visit: Payer: Self-pay | Admitting: Nurse Practitioner

## 2021-09-21 ENCOUNTER — Ambulatory Visit: Payer: 59

## 2021-09-21 DIAGNOSIS — E1142 Type 2 diabetes mellitus with diabetic polyneuropathy: Secondary | ICD-10-CM

## 2021-09-21 DIAGNOSIS — L97521 Non-pressure chronic ulcer of other part of left foot limited to breakdown of skin: Secondary | ICD-10-CM

## 2021-09-21 NOTE — Progress Notes (Signed)
SITUATION ?Reason for Consult: Follow-up with diabetic shoes and insoles ?Patient / Caregiver Report: 2nd and 3rd pairs ready ? ?OBJECTIVE DATA ?History / Diagnosis:  ?  ICD-10-CM   ?1. Diabetic polyneuropathy associated with type 2 diabetes mellitus ([place])  E11.42   ?  ?2. Skin ulcer of second toe of left foot, limited to breakdown of skin ([place])  L97.521   ?  ? ? ?Change in Pathology: None ? ?ACTIONS PERFORMED ?Patient's equipment was checked for structural stability and fit. 2nd and 3rd pairs provided and fit.  ?- 2x pair [id number] PDAC approved vacuum formed custom diabetic insoles; [employer]: [id number] & [id number] ? Device(s) intact and fit is excellent. All questions answered and concerns addressed. ? ?PLAN ?Follow-up as needed (PRN). Plan of care discussed with and agreed upon by patient / caregiver. ? ?

## 2021-09-23 ENCOUNTER — Ambulatory Visit
Admission: RE | Admit: 2021-09-23 | Discharge: 2021-09-23 | Disposition: A | Discharge status: Home / Self Care | Payer: No Typology Code available for payment source | Source: Ambulatory Visit | Attending: Neurology | Admitting: Neurology

## 2021-09-23 LAB — CUP PACEART REMOTE DEVICE CHECK
Date Time Interrogation Session: 20230426231721
Implantable Pulse Generator Implant Date: 20211008

## 2021-09-27 ENCOUNTER — Ambulatory Visit (INDEPENDENT_AMBULATORY_CARE_PROVIDER_SITE_OTHER): Payer: 59

## 2021-09-27 DIAGNOSIS — I639 Cerebral infarction, unspecified: Secondary | ICD-10-CM

## 2021-10-06 ENCOUNTER — Other Ambulatory Visit: Payer: Self-pay | Admitting: Family Medicine

## 2021-10-07 ENCOUNTER — Encounter: Payer: Self-pay | Admitting: Family Medicine

## 2021-10-07 NOTE — Telephone Encounter (Signed)
Last office visit 09/02/21 for follow up depression & right shoulder. ?Last refilled 09/10/21 for #60 with no refills.  Next Appt: 10/14/21. ?

## 2021-10-11 ENCOUNTER — Encounter: Payer: Self-pay | Admitting: Family Medicine

## 2021-10-11 NOTE — Progress Notes (Signed)
[facility] Summary Report / Loop Recorder 

## 2021-10-11 NOTE — Telephone Encounter (Signed)
Received PA request forms from [facility] on Lyrica 75mg .  PA forms completed and faxed back to [facility] at [phone]. ?

## 2021-10-14 ENCOUNTER — Ambulatory Visit (INDEPENDENT_AMBULATORY_CARE_PROVIDER_SITE_OTHER): Payer: 59 | Admitting: Family Medicine

## 2021-10-14 VITALS — BP 100/60 | HR 86 | Temp 97.6°F | Ht 69.5 in | Wt 290.4 lb

## 2021-10-14 DIAGNOSIS — I639 Cerebral infarction, unspecified: Secondary | ICD-10-CM | POA: Diagnosis not present

## 2021-10-14 DIAGNOSIS — F331 Major depressive disorder, recurrent, moderate: Secondary | ICD-10-CM | POA: Diagnosis not present

## 2021-10-14 DIAGNOSIS — R319 Hematuria, unspecified: Secondary | ICD-10-CM | POA: Diagnosis not present

## 2021-10-14 LAB — POC URINALSYSI DIPSTICK (AUTOMATED)
Bilirubin, UA: NEGATIVE
Blood, UA: NEGATIVE
Glucose, UA: POSITIVE — AB
Ketones, UA: NEGATIVE
Leukocytes, UA: NEGATIVE
Nitrite, UA: NEGATIVE
Protein, UA: NEGATIVE
Spec Grav, UA: 1.015 (ref 1.010–1.025)
Urobilinogen, UA: 0.2 E.U./dL
pH, UA: 5.5 (ref 5.0–8.0)

## 2021-10-14 MED ORDER — FLUOXETINE HCL 40 MG PO CAPS: 80.0000 mg | ORAL_CAPSULE | Freq: Every day | ORAL | 1 refills | Status: DC

## 2021-10-14 NOTE — Patient Instructions (Addendum)
Drop the clonazepam 1/2 tablet once a day for 2 weeks, then stop.  Increase the Prozac to 40 mg twice a day.

## 2021-10-14 NOTE — Progress Notes (Signed)
[name] [name] T. [name] Foti, MD, [place] at [facility] [place], [address]  Phone: [phone]  FAX: [phone]  [name] [name] - 61 y.o. male  MRN [id number]  Date of Birth: 1961/04/30  Date: 10/14/2021  PCP: Owens Loffler, MD  Referral: Owens Loffler, MD  Chief Complaint  Patient presents with   Follow-up    6 week   Hematuria   Subjective:   [name] [name] is a 61 y.o. very pleasant male patient with Body mass index is 42.27 kg/m. who presents with the following:  F/u SSRI, wean off of klonopin.  New gross hematuria.  Had blood - did not really have pain. Has had old stones in the past - basket procedure.  Looked pretty bad, gray. No real pain like had before.   Not drinking but down to one diet pepsi a day. Was drinking 6+ a day.  Wt Readings from Last 3 Encounters:  10/14/21 290 lb 6 oz (131.7 kg)  09/16/21 (!) 301 lb 12.8 oz (136.9 kg)  09/02/21 (!) 301 lb 2 oz (136.6 kg)    Depression is doing better, but will stil worry and crying some Had been worrying a lot. He and his wife both think that his depression is doing better on Prozac 60 mg. Also had him on some scheduled Klonopin 0.5 mg p.o. twice daily when he was acutely decompensating and had some severe anxiety. He does think that he has anxiety is doing better in general, but his depression is a significant thing. Important history as his prior multiple strokes that it happened relatively recently.   Review of Systems is noted in the HPI, as appropriate  Objective:   BP 100/60   Pulse 86   Temp 97.6 F (36.4 C) (Oral)   Ht 5' 9.5" (1.765 m)   Wt 290 lb 6 oz (131.7 kg)   SpO2 96%   BMI 42.27 kg/m   GEN: No acute distress; alert,appropriate. PULM: Breathing comfortably in no respiratory distress PSYCH: Normally interactive.  Mildly flat affect, very mildly tearful.  Laboratory and Imaging Data: Results for orders placed or  performed in visit on 10/14/21  POCT Urinalysis Dipstick (Automated)  Result Value Ref Range   Color, UA Yellow    Clarity, UA Clear    Glucose, UA Positive (A) Negative   Bilirubin, UA Negative    Ketones, UA Negative    Spec Grav, UA 1.015 1.010 - 1.025   Blood, UA Negative    pH, UA 5.5 5.0 - 8.0   Protein, UA Negative Negative   Urobilinogen, UA 0.2 0.2 or 1.0 E.U./dL   Nitrite, UA Negative    Leukocytes, UA Negative Negative     Assessment and Plan:     ICD-10-CM   1. Hematuria, unspecified type  R31.9 POCT Urinalysis Dipstick (Automated)    2. Moderate episode of recurrent major depressive disorder (HCC)  F33.1     3. Ischemic stroke ([place]), occipital  I63.9      Transient hematuria, now resolved.  At this point, microscopic hematuria has resolved. I think it is okay just to follow this over time.  Depression, continues to be not entirely stable or back to 100%.  At this point, I am going in his Prozac to 80 mg daily.  Overall he is doing better and a lot more stable that he has been in quite a while from a medical standpoint as well as his depression is improving.  Medication Management during today's office visit: Meds ordered this encounter  Medications   FLUoxetine (PROZAC) 40 MG capsule    Sig: Take 2 capsules (80 mg total) by mouth daily.    Dispense:  180 capsule    Refill:  1   Medications Discontinued During This Encounter  Medication Reason   FLUoxetine (PROZAC) 20 MG capsule    FLUoxetine (PROZAC) 40 MG capsule     Orders placed today for conditions managed today: Orders Placed This Encounter  Procedures   POCT Urinalysis Dipstick (Automated)    Follow-up if needed: Return in about 3 months (around 01/14/2022).  Dragon Medical One speech-to-text software was used for transcription in this dictation.  Possible transcriptional errors can occur using Editor, commissioning.   Signed,  Maud Deed. Khylon Davies, MD   Outpatient Encounter Medications as of  10/14/2021  Medication Sig   acetaminophen (TYLENOL) 500 MG tablet Take 1,000 mg by mouth 2 (two) times daily as needed (for pain).   apixaban (ELIQUIS) 5 MG TABS tablet TAKE 1 TABLET(5 MG) BY MOUTH TWICE DAILY   clonazePAM (KLONOPIN) 0.5 MG tablet TAKE 1/2 TO 1 TABLET(0.25 TO 0.5 MG) BY MOUTH TWICE DAILY AS NEEDED FOR ANXIETY   dapagliflozin propanediol (FARXIGA) 10 MG TABS tablet Take 1 tablet (10 mg total) by mouth daily.   diltiazem (CARDIZEM) 30 MG tablet TAKE 1 TABLET BY MOUTH EVERY 4 HOURS AS NEEDED FOR HEART RATE>100. Appointment Required For Further Refills [phone]   Dulaglutide (TRULICITY) 4.5 0000000 SOPN Inject 4.5 mg into the skin once a week.   EPINEPHRINE 0.3 mg/0.3 mL IJ SOAJ injection ADMINISTER 0.3 ML IN THE MUSCLE 1 TIME FOR 1 DOSE   FLUoxetine (PROZAC) 40 MG capsule Take 2 capsules (80 mg total) by mouth daily.   glucose blood (CONTOUR NEXT TEST) test strip USE TO CHECK BLOOD SUGAR   Lancets Micro Thin 33G MISC Use to check blood sugar two times daily.   lisinopril (ZESTRIL) 40 MG tablet TAKE 1 TABLET(40 MG) BY MOUTH DAILY   metFORMIN (GLUCOPHAGE-XR) 500 MG 24 hr tablet TAKE 4 TABLETS BY MOUTH DAILY WITH BREAKFAST   Multiple Vitamin (MULTIVITAMIN) tablet Take 1 tablet by mouth daily.   pantoprazole (PROTONIX) 40 MG tablet TAKE 1 TABLET(40 MG) BY MOUTH DAILY   pentoxifylline (TRENTAL) 400 MG CR tablet TAKE 1 TABLET BY MOUTH THREE TIMES DAILY WITH MEALS   pregabalin (LYRICA) 75 MG capsule 1 cap po qam, 1 cap po afternoon, and 2 caps po qhs   repaglinide (PRANDIN) 0.5 MG tablet Take 1 tablet (0.5 mg total) by mouth 2 (two) times daily before a meal.   rOPINIRole (REQUIP) 0.5 MG tablet TAKE 1 TABLET(0.5 MG) BY MOUTH TWICE DAILY   rosuvastatin (CRESTOR) 20 MG tablet TAKE 1 TABLET(20 MG) BY MOUTH AT BEDTIME   varenicline (CHANTIX) 1 MG tablet TAKE 1 TABLET(1 MG) BY MOUTH TWICE DAILY   [DISCONTINUED] FLUoxetine (PROZAC) 20 MG capsule Take 1 capsule (20 mg total) by mouth  daily.   [DISCONTINUED] FLUoxetine (PROZAC) 40 MG capsule TAKE 1 CAPSULE(40 MG) BY MOUTH DAILY   No facility-administered encounter medications on file as of 10/14/2021.

## 2021-10-15 ENCOUNTER — Encounter: Payer: Self-pay | Admitting: Family Medicine

## 2021-10-25 ENCOUNTER — Other Ambulatory Visit: Payer: Self-pay | Admitting: Family Medicine

## 2021-10-25 DIAGNOSIS — E11622 Type 2 diabetes mellitus with other skin ulcer: Secondary | ICD-10-CM

## 2021-10-27 ENCOUNTER — Encounter: Payer: Self-pay | Admitting: Podiatry

## 2021-10-27 ENCOUNTER — Ambulatory Visit (INDEPENDENT_AMBULATORY_CARE_PROVIDER_SITE_OTHER): Payer: 59 | Admitting: Podiatry

## 2021-10-27 DIAGNOSIS — M79676 Pain in unspecified toe(s): Secondary | ICD-10-CM | POA: Diagnosis not present

## 2021-10-27 DIAGNOSIS — E1142 Type 2 diabetes mellitus with diabetic polyneuropathy: Secondary | ICD-10-CM | POA: Diagnosis not present

## 2021-10-27 DIAGNOSIS — I739 Peripheral vascular disease, unspecified: Secondary | ICD-10-CM | POA: Diagnosis not present

## 2021-10-27 DIAGNOSIS — B351 Tinea unguium: Secondary | ICD-10-CM | POA: Diagnosis not present

## 2021-10-31 NOTE — Progress Notes (Signed)
  Subjective:  Patient ID: [name] [name], male    DOB: 1961/03/09,  MRN: [id number]  Clair Alfieri presents to clinic today for at risk foot care with history of diabetic neuropathy and painful elongated mycotic toenails 1-5 bilaterally which are tender when wearing enclosed shoe gear. Pain is relieved with periodic professional debridement.  Patient states blood glucose was 103 mg/dl today.  Last known HgA1c was 6.2%.  Patient has h/o wound of LLE secondary to spider bite. (I &D/Wound Vac in 2015; Split thickness skin graft 2016).  New problem(s): .  Patient states his wife noticed discoloration of left 5th toe and diabetic shoe is suspected as cause of  discoloration. He denies any pain or swelling.  PCP is Copland, Karleen Hampshire, MD , and last visit was Oct 14, 2021.  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   Pistachio Nut (Diagnostic) Hives, Shortness Of Breath, Itching, Swelling and Rash   Latex Dermatitis   Tape Other (See Comments)    Tears and bruises the skin   Betadine [Povidone Iodine] Rash and Other (See Comments)    Raised rash 04/23/14   Lidocaine Other (See Comments)    Minimal effect with lidocaine, prefers different anesthetic   Penicillins Rash and Other (See Comments)    Rash all over as a child    Review of Systems: Negative except as noted in the HPI.  Objective: No changes noted in today's physical examination. Objective:   Vascular Examination: Vascular status intact b/l with palpable pedal pulses. Pedal hair present b/l. CFT immediate b/l. No edema. No pain with calf compression b/l. Skin temperature gradient WNL b/l. Evidence of chronic venous insufficiency b/l LE.  Neurological Examination: Pt has subjective symptoms of neuropathy. Protective sensation diminished with 10g monofilament b/l. Vibratory sensation diminished b/l.  Dermatological Examination: Pedal skin is warm and supple b/l LE. Scarring  noted. No open wounds b/l LE. No interdigital macerations noted b/l LE. Toenails 1-5 bilaterally elongated, discolored, dystrophic, thickened, and crumbly with subungual debris and tenderness to dorsal palpation. Dried blood blister noted dorsal aspect right 5th toe. No erythema, no edema, no drainage, no fluctuance. Left 2nd digit epithelialized with no signs of infection.  Musculoskeletal Examination: Muscle strength 5/5 to b/l LE. Hammertoe deformity noted 2-5 b/l. Charcot deformity noted right foot.  Radiographs: None  Last A1c:      Latest Ref Rng & Units 09/16/2021    8:22 AM 07/14/2021   10:00 AM 06/10/2021    8:56 AM 02/24/2021   11:02 AM 11/24/2020   10:52 AM  Hemoglobin A1C  Hemoglobin-A1c 4.0 - 5.6 % 6.4   6.4   6.7   5.9   6.0     Assessment/Plan: 1. Pain due to onychomycosis of toenail   2. PVD (peripheral vascular disease) (HCC)   3. Diabetic polyneuropathy associated with type 2 diabetes mellitus (HCC)      -Patient was evaluated and treated. All patient's and/or POA's questions/concerns answered on today's visit. -Patient advised to protect toe with fabric band-aid or silicone toe cap on right 5th toe. If he has any problems, he may follow up with Dr. Al Corpus. -Patient to continue soft, supportive shoe gear daily. -Mycotic toenails 1-5 bilaterally were debrided in length and girth with sterile nail nippers and dremel without incident. -Patient/POA to call should there be question/concern in the interim.   Return in about 3 months (around 01/27/2022).  Freddie Breech, DPM

## 2021-11-01 ENCOUNTER — Ambulatory Visit (INDEPENDENT_AMBULATORY_CARE_PROVIDER_SITE_OTHER): Payer: 59

## 2021-11-01 DIAGNOSIS — I639 Cerebral infarction, unspecified: Secondary | ICD-10-CM

## 2021-11-02 LAB — CUP PACEART REMOTE DEVICE CHECK
Date Time Interrogation Session: 20230529232541
Implantable Pulse Generator Implant Date: 20211008

## 2021-11-11 ENCOUNTER — Encounter: Payer: Self-pay | Admitting: Family Medicine

## 2021-11-17 NOTE — Progress Notes (Signed)
[facility] Summary Report / Loop Recorder 

## 2021-11-29 ENCOUNTER — Other Ambulatory Visit: Payer: Self-pay | Admitting: Family Medicine

## 2021-11-29 NOTE — Telephone Encounter (Signed)
Last office visit 05/18/203 for hematuria, MDD and Ischemic stroke.  Last refilled 08/30/21 for #60 with 3 refills.  Next Appt: 01/19/2022.

## 2021-12-01 ENCOUNTER — Other Ambulatory Visit: Payer: Self-pay

## 2021-12-01 DIAGNOSIS — E11622 Type 2 diabetes mellitus with other skin ulcer: Secondary | ICD-10-CM

## 2021-12-01 MED ORDER — DAPAGLIFLOZIN PROPANEDIOL 10 MG PO TABS: 10.0000 mg | ORAL_TABLET | Freq: Every day | ORAL | 3 refills | Status: DC

## 2021-12-03 LAB — CUP PACEART REMOTE DEVICE CHECK
Date Time Interrogation Session: 20230701232516
Implantable Pulse Generator Implant Date: 20211008

## 2021-12-06 ENCOUNTER — Ambulatory Visit (INDEPENDENT_AMBULATORY_CARE_PROVIDER_SITE_OTHER): Payer: 59

## 2021-12-06 DIAGNOSIS — I639 Cerebral infarction, unspecified: Secondary | ICD-10-CM

## 2021-12-29 ENCOUNTER — Other Ambulatory Visit: Payer: Self-pay | Admitting: Internal Medicine

## 2021-12-29 ENCOUNTER — Other Ambulatory Visit: Payer: Self-pay | Admitting: Family Medicine

## 2021-12-29 NOTE — Telephone Encounter (Signed)
Prescription refill request for Eliquis received. Indication: Atrial Fib Last office visit: 10/15/20  Everlena Cooper NP Scr: 0.98 on 07/14/21 Age: 61 Weight:127.2kg  Based on above findings Eliquis 5mg  twice daily is the appropriate dose.  Pt is past due for yearly MD appt.  Message sent to schedulers to make appt.  Refill approved x 1.

## 2021-12-30 NOTE — Progress Notes (Signed)
[facility] Summary Report / Loop Recorder 

## 2021-12-31 LAB — CUP PACEART REMOTE DEVICE CHECK
Date Time Interrogation Session: 20230803232847
Implantable Pulse Generator Implant Date: 20211008

## 2022-01-03 ENCOUNTER — Ambulatory Visit (INDEPENDENT_AMBULATORY_CARE_PROVIDER_SITE_OTHER): Payer: 59

## 2022-01-03 DIAGNOSIS — I639 Cerebral infarction, unspecified: Secondary | ICD-10-CM

## 2022-01-16 NOTE — Progress Notes (Unsigned)
    [name] [name] T. [name] Dhingra, MD, CAQ Sports Medicine [facility] [address] [place], [address]  Phone: [phone]  FAX: [phone]  [name] [name] - 61 y.o. male  MRN [id number]  Date of Birth: [birth year]  Date: 01/19/2022  PCP: [name] Beat, MD  Referral: [name] Beat, MD  No chief complaint on file.  Subjective:   [name] [name] is a 61 y.o. very pleasant male patient with There is no height or weight on file to calculate BMI. who presents with the following:  Follow-up on multiple different medical problems, and the last time I seen him he had been quite depressed in the earlier part of the year.  He had had some increased depression after his strokes.  He is now on Prozac 80 mg.  Historically, poorly controlled diabetes, but it gotten much better within the last year.  Diabetes Mellitus: Tolerating Medications: yes Compliance with diet: fair, There is no height or weight on file to calculate BMI. Exercise: minimal / intermittent Avg blood sugars at home: not checking Foot problems: none Hypoglycemia: none No nausea, vomitting, blurred vision, polyuria.  Lab Results  Component Value Date   HGBA1C 6.4 (A) 09/16/2021   HGBA1C 6.4 (H) 07/14/2021   HGBA1C 6.7 (A) 06/10/2021   Lab Results  Component Value Date   MICROALBUR 2.82 (H) 07/27/2007   LDLCALC 59 07/14/2021   CREATININE 0.98 07/14/2021    Wt Readings from Last 3 Encounters:  10/14/21 290 lb 6 oz (131.7 kg)  09/16/21 (!) 301 lb 12.8 oz (136.9 kg)  09/02/21 (!) 301 lb 2 oz (136.6 kg)    HTN: Tolerating all medications without side effects Stable and at goal No CP, no sob. No HA.  BP Readings from Last 3 Encounters:  10/14/21 100/60  09/16/21 134/72  09/02/21 106/60    Basic Metabolic Panel:    Component Value Date/Time   NA 142 07/14/2021 1000   K 4.3 07/14/2021 1000   CL 104 07/14/2021 1000   CO2 27 07/14/2021 1000   BUN 17 07/14/2021 1000    CREATININE 0.98 07/14/2021 1000   GLUCOSE 166 (H) 07/14/2021 1000   GLUCOSE 83 10/15/2020 1100   CALCIUM 9.7 07/14/2021 1000    Lipids: Doing well, stable. Tolerating meds fine with no SE. Panel reviewed with patient.  Lipids: Lab Results  Component Value Date   CHOL 134 07/14/2021   Lab Results  Component Value Date   HDL 38 (L) 07/14/2021   Lab Results  Component Value Date   LDLCALC 59 07/14/2021   Lab Results  Component Value Date   TRIG 231 (H) 07/14/2021   Lab Results  Component Value Date   CHOLHDL 3.5 07/14/2021    Lab Results  Component Value Date   ALT 22 07/14/2021   AST 20 07/14/2021   ALKPHOS 61 07/14/2021   BILITOT 0.4 07/14/2021    Historically, he was also a smoker.    Review of Systems is noted in the HPI, as appropriate  Objective:   There were no vitals taken for this visit.  GEN: No acute distress; alert,appropriate. PULM: Breathing comfortably in no respiratory distress PSYCH: Normally interactive.   Laboratory and Imaging Data:  Assessment and Plan:   ***

## 2022-01-19 ENCOUNTER — Encounter: Payer: Self-pay | Admitting: Family Medicine

## 2022-01-19 ENCOUNTER — Ambulatory Visit (INDEPENDENT_AMBULATORY_CARE_PROVIDER_SITE_OTHER): Payer: 59 | Admitting: Family Medicine

## 2022-01-19 VITALS — BP 108/70 | HR 73 | Temp 97.6°F | Ht 69.5 in | Wt 287.4 lb

## 2022-01-19 DIAGNOSIS — Z8673 Personal history of transient ischemic attack (TIA), and cerebral infarction without residual deficits: Secondary | ICD-10-CM | POA: Diagnosis not present

## 2022-01-19 DIAGNOSIS — E1159 Type 2 diabetes mellitus with other circulatory complications: Secondary | ICD-10-CM | POA: Diagnosis not present

## 2022-01-19 DIAGNOSIS — F411 Generalized anxiety disorder: Secondary | ICD-10-CM

## 2022-01-19 DIAGNOSIS — I10 Essential (primary) hypertension: Secondary | ICD-10-CM

## 2022-01-19 DIAGNOSIS — Z23 Encounter for immunization: Secondary | ICD-10-CM

## 2022-01-19 DIAGNOSIS — E1142 Type 2 diabetes mellitus with diabetic polyneuropathy: Secondary | ICD-10-CM

## 2022-01-19 DIAGNOSIS — I639 Cerebral infarction, unspecified: Secondary | ICD-10-CM

## 2022-01-19 DIAGNOSIS — F17200 Nicotine dependence, unspecified, uncomplicated: Secondary | ICD-10-CM | POA: Diagnosis not present

## 2022-01-19 DIAGNOSIS — F331 Major depressive disorder, recurrent, moderate: Secondary | ICD-10-CM

## 2022-01-19 DIAGNOSIS — E785 Hyperlipidemia, unspecified: Secondary | ICD-10-CM | POA: Diagnosis not present

## 2022-01-19 LAB — POCT GLYCOSYLATED HEMOGLOBIN (HGB A1C): Hemoglobin A1C: 6.3 % — AB (ref 4.0–5.6)

## 2022-01-19 MED ORDER — PREGABALIN 200 MG PO CAPS: 200.0000 mg | ORAL_CAPSULE | Freq: Two times a day (BID) | ORAL | 1 refills | Status: DC

## 2022-01-19 NOTE — Patient Instructions (Signed)
Stop the Lyrica 75 mg capsules  Start the Lyrica 200 mg capsules, one capsule in the morning and one capsule at night.  If you are still having a lot of pain in 2 weeks, send me a mychart message and we can increase the Lyrica to 200 mg twice a day.

## 2022-02-02 ENCOUNTER — Encounter: Payer: Self-pay | Admitting: Family Medicine

## 2022-02-02 LAB — CUP PACEART REMOTE DEVICE CHECK
Date Time Interrogation Session: 20230905233145
Implantable Pulse Generator Implant Date: 20211008

## 2022-02-02 MED ORDER — PREGABALIN 300 MG PO CAPS: 300.0000 mg | ORAL_CAPSULE | Freq: Two times a day (BID) | ORAL | 3 refills | Status: DC

## 2022-02-07 ENCOUNTER — Ambulatory Visit (INDEPENDENT_AMBULATORY_CARE_PROVIDER_SITE_OTHER): Payer: Commercial Managed Care - HMO

## 2022-02-07 DIAGNOSIS — I639 Cerebral infarction, unspecified: Secondary | ICD-10-CM | POA: Diagnosis not present

## 2022-02-08 ENCOUNTER — Ambulatory Visit (INDEPENDENT_AMBULATORY_CARE_PROVIDER_SITE_OTHER): Payer: Commercial Managed Care - HMO | Admitting: Podiatry

## 2022-02-08 DIAGNOSIS — B351 Tinea unguium: Secondary | ICD-10-CM | POA: Diagnosis not present

## 2022-02-08 DIAGNOSIS — I739 Peripheral vascular disease, unspecified: Secondary | ICD-10-CM | POA: Diagnosis not present

## 2022-02-08 DIAGNOSIS — E1142 Type 2 diabetes mellitus with diabetic polyneuropathy: Secondary | ICD-10-CM | POA: Diagnosis not present

## 2022-02-08 DIAGNOSIS — M79676 Pain in unspecified toe(s): Secondary | ICD-10-CM

## 2022-02-09 ENCOUNTER — Encounter: Payer: Self-pay | Admitting: Family Medicine

## 2022-02-09 NOTE — Progress Notes (Signed)
[facility] Summary Report / Loop Recorder 

## 2022-02-12 NOTE — Progress Notes (Signed)
  Subjective:  Patient ID: [name] [name], male    DOB: November 29, 1960,  MRN: [id number]  [name] [name] presents to clinic today for at risk foot care. Pt has h/o NIDDM with PAD and painful thick toenails that are difficult to trim. Pain interferes with ambulation. Aggravating factors include wearing enclosed shoe gear. Pain is relieved with periodic professional debridement.  Last known  HgA1c was 6.3%.    PCP is Copland, Frederico Hamman, MD , and last visit was  January 19, 2022.  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   Pistachio Nut (Diagnostic) Hives, Shortness Of Breath, Itching, Swelling and Rash   Latex Dermatitis   Tape Other (See Comments)    Tears and bruises the skin   Betadine [Povidone Iodine] Rash and Other (See Comments)    Raised rash 04/23/14   Lidocaine Other (See Comments)    Minimal effect with lidocaine, prefers different anesthetic   Penicillins Rash and Other (See Comments)    Rash all over as a child    Review of Systems: Negative except as noted in the HPI.  Objective: No changes noted in today's physical examination. [name] [name] is a pleasant 61 y.o. male in NAD. AAO x 3.  Vascular Examination: Vascular status intact b/l with palpable pedal pulses. Pedal hair present b/l. CFT immediate b/l. No edema. No pain with calf compression b/l. Skin temperature gradient WNL b/l. Evidence of chronic venous insufficiency b/l LE.  Neurological Examination: Pt has subjective symptoms of neuropathy. Protective sensation diminished with 10g monofilament b/l. Vibratory sensation diminished b/l.  Dermatological Examination: Pedal skin is warm and supple b/l LE. Scarring noted. No open wounds b/l LE. No interdigital macerations noted b/l LE. Toenails 1-5 bilaterally elongated, discolored, dystrophic, thickened, and crumbly with subungual debris and tenderness to dorsal palpation.   Musculoskeletal Examination: Muscle  strength 5/5 to b/l LE. Hammertoe deformity noted 2-5 b/l. Charcot deformity noted right foot. Patient ambulates with cane assistance.  Radiographs: None Assessment/Plan: 1. Pain due to onychomycosis of toenail   2. PVD (peripheral vascular disease) ([place])   3. Diabetic polyneuropathy associated with type 2 diabetes mellitus ([place])     -Examined patient. -Toenails 1-5 b/l were debrided in length and girth with sterile nail nippers and dremel without iatrogenic bleeding.  -Patient/POA to call should there be question/concern in the interim.   Return in about 3 months (around 05/10/2022).  Marzetta Board, DPM

## 2022-02-21 ENCOUNTER — Other Ambulatory Visit: Payer: Self-pay | Admitting: Nurse Practitioner

## 2022-02-21 DIAGNOSIS — I480 Paroxysmal atrial fibrillation: Secondary | ICD-10-CM

## 2022-02-21 NOTE — Telephone Encounter (Signed)
Prescription refill request for Eliquis received. Indication: afib  Last office visit: 10/15/2020, Kayleen Memos Scr: 0.98, 07/14/2021 Age: 61  Weight: 130.4 kg   Pt is overdue to see cardiologist. Msg sent to schedulers.

## 2022-02-23 NOTE — Telephone Encounter (Signed)
Pt has scheduled appt on 03/10/22 at [facility] with Kayleen Memos.   Refill sent to requested pharmacy.

## 2022-02-24 NOTE — Progress Notes (Signed)
[facility] Summary Report / Loop Recorder 

## 2022-02-26 ENCOUNTER — Encounter: Payer: Self-pay | Admitting: Internal Medicine

## 2022-02-28 ENCOUNTER — Other Ambulatory Visit: Payer: Self-pay

## 2022-02-28 MED ORDER — REPAGLINIDE 0.5 MG PO TABS: 0.5000 mg | ORAL_TABLET | Freq: Two times a day (BID) | ORAL | 2 refills | Status: DC

## 2022-03-02 ENCOUNTER — Telehealth: Payer: Self-pay

## 2022-03-02 NOTE — Telephone Encounter (Signed)
**Note De-Identified  Obfuscation** Eliquis PA started through covermymeds. Key: [id number]

## 2022-03-04 MED ORDER — FLUOXETINE HCL 40 MG PO CAPS: 80.0000 mg | ORAL_CAPSULE | Freq: Every day | ORAL | 1 refills | Status: DC

## 2022-03-04 NOTE — Addendum Note (Signed)
Addended by: Carter Kitten on: 03/04/2022 12:37 PM   Modules accepted: Orders

## 2022-03-10 ENCOUNTER — Encounter (HOSPITAL_COMMUNITY): Payer: Self-pay | Admitting: Nurse Practitioner

## 2022-03-10 ENCOUNTER — Ambulatory Visit (HOSPITAL_COMMUNITY)
Admission: RE | Admit: 2022-03-10 | Discharge: 2022-03-10 | Disposition: A | Discharge status: Home / Self Care | Payer: Commercial Managed Care - HMO | Source: Ambulatory Visit | Attending: Physician Assistant | Admitting: Physician Assistant

## 2022-03-10 VITALS — BP 118/66 | HR 72 | Ht 69.5 in | Wt 292.4 lb

## 2022-03-10 DIAGNOSIS — E1140 Type 2 diabetes mellitus with diabetic neuropathy, unspecified: Secondary | ICD-10-CM | POA: Insufficient documentation

## 2022-03-10 DIAGNOSIS — I480 Paroxysmal atrial fibrillation: Secondary | ICD-10-CM | POA: Diagnosis present

## 2022-03-10 DIAGNOSIS — K219 Gastro-esophageal reflux disease without esophagitis: Secondary | ICD-10-CM | POA: Insufficient documentation

## 2022-03-10 DIAGNOSIS — I4510 Unspecified right bundle-branch block: Secondary | ICD-10-CM | POA: Insufficient documentation

## 2022-03-10 DIAGNOSIS — F419 Anxiety disorder, unspecified: Secondary | ICD-10-CM | POA: Diagnosis not present

## 2022-03-10 DIAGNOSIS — Z7901 Long term (current) use of anticoagulants: Secondary | ICD-10-CM | POA: Diagnosis not present

## 2022-03-10 DIAGNOSIS — Z8673 Personal history of transient ischemic attack (TIA), and cerebral infarction without residual deficits: Secondary | ICD-10-CM | POA: Insufficient documentation

## 2022-03-10 DIAGNOSIS — E785 Hyperlipidemia, unspecified: Secondary | ICD-10-CM | POA: Diagnosis not present

## 2022-03-10 DIAGNOSIS — F32A Depression, unspecified: Secondary | ICD-10-CM | POA: Diagnosis not present

## 2022-03-10 DIAGNOSIS — D6869 Other thrombophilia: Secondary | ICD-10-CM | POA: Diagnosis not present

## 2022-03-10 DIAGNOSIS — Z87891 Personal history of nicotine dependence: Secondary | ICD-10-CM | POA: Diagnosis not present

## 2022-03-10 DIAGNOSIS — G2581 Restless legs syndrome: Secondary | ICD-10-CM | POA: Insufficient documentation

## 2022-03-10 DIAGNOSIS — Z95818 Presence of other cardiac implants and grafts: Secondary | ICD-10-CM | POA: Insufficient documentation

## 2022-03-10 DIAGNOSIS — I10 Essential (primary) hypertension: Secondary | ICD-10-CM | POA: Diagnosis not present

## 2022-03-10 LAB — CUP PACEART REMOTE DEVICE CHECK
Date Time Interrogation Session: 20231008233515
Implantable Pulse Generator Implant Date: 20211008

## 2022-03-10 MED ORDER — APIXABAN 5 MG PO TABS: ORAL_TABLET | ORAL | 6 refills | Status: DC

## 2022-03-10 MED ORDER — DILTIAZEM HCL 30 MG PO TABS: ORAL_TABLET | ORAL | 1 refills | Status: DC

## 2022-03-10 NOTE — Progress Notes (Signed)
Primary Care Physician: Hannah Beat, MD Referring Physician: Dr. E[facility]al[facility] clinic   [name] [name] is a 61 y.o. male with a h/o  hypertension, hyperlipidemia, type 2 diabetes mellitus, restless leg syndrome, diabetic neuropathy, morbid obesity, tobacco abuse, depression/anxiety, GERD, chronic venous stasis ulcer in bilateral lower extremities that presented with stroke symptoms 01/2020. Left occipital lobe infarct was noted on the CT perfusion study He has a Linq implanted.  He is in the afib clinic as his LInq has shown new onset afib with 3 recent episodes on 09/05/20 between 14:33 pm  to 16:01 pm. Reviewed by Dr. Lalla Brothers and determined he was in afib.  He is now in the afib clinic to discuss start of anticoagulation. Hr was receiving Acardia, research drug, given  after stroke, blinded to either asa or eliquis. Wife discussed with Dr. Pearlean Brownie and he said to stop study drug. I discussed starting eliquis 5 mg bid to reduce stoke risk with now seen afib and he is in agreement. No bleeding history. He reports that he has stopped smoking, is now starting Chantix, he has lost 135 lbs over the last 2 years but primarily since l;ast fall. He is exercising and no longer requires CPAP for snoring . He walks with a cane but no falls. Moderate caffeine use.   F/u in afib clinic, 10/15/20. He is doing well with start of anticoagulation. No bleeding issues. Has noted a mild H/A on awakening a few mornings but is brief. He is not snoring or apnea. Pt  questioning if related to DOAC and I do not usually hear this. No awareness of irregular HB and is in SR today.   F/u in afib clinic, 10 /12/23 ,for  in Dr. Jenel Lucks absence, he needs an eliquis refill. I reviewed his linq reports and he has had not had any arrhythmia. He does have some "spells" where he will be grey and very fatigued. He reports occasionally after taking out trash and walking up steep incline up his driveway he will have symptoms of shortness  of breath, he has not had a recent stress test. . He has many risk factors for CAD and discussed referring to general cardiology. His wife sees Dr. Okey Dupre and would like for pt to see him.   Today, he denies symptoms of palpitations, chest pain, shortness of breath, orthopnea, PND, lower extremity edema, dizziness, presyncope, syncope, or neurologic sequela. The patient is tolerating medications without difficulties and is otherwise without complaint today.   Past Medical History:  Diagnosis Date   Angio-edema    Anxiety    Depression    Diabetes mellitus type II    type 2   GERD (gastroesophageal reflux disease)    Headache    migraine   History of kidney stones    HLD (hyperlipidemia)    HTN (hypertension)    Morbid obesity (HCC)    Restless legs syndrome (RLS) 11/20/2012   Stroke (HCC) 02/14/2020   ischemic/right sided affected   Tobacco abuse    Ulcer of left ankle (HCC)    last 2 months dime size dry dressing changing q day   Past Surgical History:  Procedure Laterality Date   Arm surgery Right 1969   Abscess excision and debridement at elbow   COLONOSCOPY WITH PROPOFOL N/A 09/06/2016   Procedure: COLONOSCOPY WITH PROPOFOL;  Surgeon: Sherrilyn Rist, MD;  Location: [facility];  Service: Gastroenterology;  Laterality: N/A;   I & D EXTREMITY Left 05/29/2014   Procedure: IRRIGATION AND  DEBRIDEMENT OF LEFT LEG WOUND AND PLACEMENT OF  INTEGRA  AND  VAC;  Surgeon: Wayland Denis, DO;  Location: [facility];  Service: Plastics;  Laterality: Left;   implantable loop recorder implant  03/06/2020   Medtronic Reveal [place] model LNQ11 (SN  [id number]) implanted by Dr Johney Frame for cryptogenic stroke   INCISION AND DRAINAGE OF WOUND Left 04/23/2014   Procedure: IRRIGATION AND DEBRIDEMENT LOWER LEFT LEG WOUND WITH PLACEMENT OF INTEGRA AND VAC;  Surgeon: Wayland Denis, DO;  Location: [facility];  Service: Plastics;  Laterality: Left;   kidney stone removal  2004    MINOR APPLICATION OF WOUND VAC Left 04/23/2014   Procedure: MINOR APPLICATION OF WOUND VAC;  Surgeon: Wayland Denis, DO;  Location: [facility];  Service: Plastics;  Laterality: Left;   SKIN SPLIT GRAFT Left 06/18/2014   Procedure: SKIN GRAFT SPLIT THICKNESS TO LOWER LEFT LEG WOUND WITH PLACEMENT OF VAC;  Surgeon: Wayland Denis, DO;  Location: [facility];  Service: Plastics;  Laterality: Left;   VASECTOMY  1994    Current Outpatient Medications  Medication Sig Dispense Refill   acetaminophen (TYLENOL) 500 MG tablet Take 1,000 mg by mouth 2 (two) times daily as needed (for pain).     cetirizine (ZYRTEC) 10 MG tablet Take 10 mg by mouth daily.     dapagliflozin propanediol (FARXIGA) 10 MG TABS tablet Take 1 tablet (10 mg total) by mouth daily. 90 tablet 3   Dulaglutide (TRULICITY) 4.5 MG/0.5ML SOPN Inject 4.5 mg into the skin once a week. 6 mL 1   EPINEPHRINE 0.3 mg/0.3 mL IJ SOAJ injection ADMINISTER 0.3 ML IN THE MUSCLE 1 TIME FOR 1 DOSE 2 each 0   FLUoxetine (PROZAC) 40 MG capsule Take 2 capsules (80 mg total) by mouth daily. 180 capsule 1   glucose blood (CONTOUR NEXT TEST) test strip USE TO CHECK BLOOD SUGAR 100 strip 5   lisinopril (ZESTRIL) 40 MG tablet TAKE 1 TABLET(40 MG) BY MOUTH DAILY 90 tablet 3   metFORMIN (GLUCOPHAGE-XR) 500 MG 24 hr tablet TAKE 4 TABLETS BY MOUTH DAILY WITH BREAKFAST 360 tablet 3   Microlet Lancets MISC USE AS DIRECTED TWICE DAILY 100 each 5   Multiple Vitamin (MULTIVITAMIN) tablet Take 1 tablet by mouth daily.     pantoprazole (PROTONIX) 40 MG tablet TAKE 1 TABLET(40 MG) BY MOUTH DAILY 90 tablet 3   pentoxifylline (TRENTAL) 400 MG CR tablet TAKE 1 TABLET BY MOUTH THREE TIMES DAILY WITH MEALS 270 tablet 3   pregabalin (LYRICA) 300 MG capsule Take 1 capsule (300 mg total) by mouth 2 (two) times daily. 60 capsule 3   repaglinide (PRANDIN) 0.5 MG tablet Take 1 tablet (0.5 mg total) by mouth 2 (two) times daily before a meal. 60 tablet 2    rOPINIRole (REQUIP) 0.5 MG tablet TAKE 1 TABLET(0.5 MG) BY MOUTH TWICE DAILY 180 tablet 3   rosuvastatin (CRESTOR) 20 MG tablet TAKE 1 TABLET(20 MG) BY MOUTH AT BEDTIME 90 tablet 1   varenicline (CHANTIX) 1 MG tablet TAKE 1 TABLET(1 MG) BY MOUTH TWICE DAILY 60 tablet 3   apixaban (ELIQUIS) 5 MG TABS tablet TAKE 1 TABLET(5 MG) BY MOUTH TWICE DAILY 60 tablet 6   clonazePAM (KLONOPIN) 0.5 MG tablet TAKE 1/2 TO 1 TABLET(0.25 TO 0.5 MG) BY MOUTH TWICE DAILY AS NEEDED FOR ANXIETY (Patient not taking: Reported on 03/10/2022) 60 tablet 0   diltiazem (CARDIZEM) 30 MG tablet TAKE 1 TABLET BY MOUTH EVERY  4 HOURS AS NEEDED FOR HEART RATE>100. 30 tablet 1   No current facility-administered medications for this encounter.    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   Pistachio Nut (Diagnostic) Hives, Shortness Of Breath, Itching, Swelling and Rash   Latex Dermatitis   Tape Other (See Comments)    Tears and bruises the skin   Betadine [Povidone Iodine] Rash and Other (See Comments)    Raised rash 04/23/14   Lidocaine Other (See Comments)    Minimal effect with lidocaine, prefers different anesthetic   Penicillins Rash and Other (See Comments)    Rash all over as a child    Social History   Socioeconomic History   Marital status: Married    Spouse name: Regulatory affairs officer   Number of children: 3   Years of education: Not on file   Highest education level: Not on file  Occupational History   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    Comment: no longer working 07/07/20  Tobacco Use   Smoking status: Some Days    Types: E-cigarettes   Smokeless tobacco: Never   Tobacco comments:    half pack daily  Substance and Sexual Activity   Alcohol use: Not Currently    Alcohol/week: 0.0 standard drinks of alcohol    Comment: rarely   Drug use: No   Sexual activity: Yes    Partners: Female  Other Topics Concern   Not on file  Social  History Narrative   Lives with wife   Right Handed   Drink 6-7 cups caffeine daily   Social Determinants of Health   Financial Resource Strain: Not on file  Food Insecurity: Not on file  Transportation Needs: Not on file  Physical Activity: Not on file  Stress: Not on file  Social Connections: Not on file  Intimate Partner Violence: Not on file    Family History  Problem Relation Age of Onset   Stroke Mother        multiple   Leukemia Mother    Pneumonia Mother    Cancer Mother    Diabetes Mother    Diabetes Father    Heart disease Father    Hyperlipidemia Father    Hypertension Father    Allergic rhinitis Neg Hx    Angioedema Neg Hx    Asthma Neg Hx    Eczema Neg Hx    Immunodeficiency Neg Hx    Urticaria Neg Hx     ROS- All systems are reviewed and negative except as per the HPI above  Physical Exam: Vitals:   03/10/22 0841  BP: 118/66  Pulse: 72  Weight: 132.6 kg  Height: 5' 9.5" (1.765 m)   Wt Readings from Last 3 Encounters:  03/10/22 132.6 kg  01/19/22 130.4 kg  10/14/21 131.7 kg    Labs: Lab Results  Component Value Date   NA 142 07/14/2021   K 4.3 07/14/2021   CL 104 07/14/2021   CO2 27 07/14/2021   GLUCOSE 166 (H) 07/14/2021   BUN 17 07/14/2021   CREATININE 0.98 07/14/2021   CALCIUM 9.7 07/14/2021   Lab Results  Component Value Date   INR 0.9 02/14/2020   Lab Results  Component Value Date   CHOL 134 07/14/2021   HDL 38 (L) 07/14/2021   LDLCALC 59 07/14/2021   TRIG 231 (H) 07/14/2021     GEN- The patient is well appearing, alert and oriented x 3 today.  Head- normocephalic, atraumatic Eyes-  Sclera clear, conjunctiva pink Ears- hearing intact Oropharynx- clear Neck- supple, no JVP Lymph- no cervical lymphadenopathy Lungs- Clear to ausculation bilaterally, normal work of breathing Heart- Regular rate and rhythm, no murmurs, rubs or gallops, PMI not laterally displaced GI- soft, NT, ND, + BS Extremities- no clubbing,  cyanosis, or edema MS- no significant deformity or atrophy Skin- no rash or lesion Psych- euthymic mood, full affect Neuro- strength and sensation are intact  EKG Vent. rate 72 BPM PR interval 178 ms QRS duration 116 ms QT/QTcB 406/444 ms P-R-T axes 27 -59 12 Normal sinus rhythm Left axis deviation Incomplete right bundle branch block Inferior infarct , age undetermined Cannot rule out Anterior infarct , age undetermined Abnormal ECG When compared with ECG of 15-Oct-2020 10:45, PREVIOUS ECG IS PRESENT  Epic records reviewed   [facility] records reviewed   Assessment and Plan: 1. afib  Very brief episodes noted  Overall, no arrhythmia's have been noted on Linq reports  Continue monitoring of afib thru Linq/device clinic   2. CHA2DS2VASc score of at least 4/prior CVA Eliquis 5 mg bid    3. Smoking cessation He has stopped smoking    4. Risk factors for CAD Describes some shortness of breath walking up inclines  Will refer to establish with Dr. Okey Dupre per wife request, his pt as well   5. HTN Stable   F/u with afib clinic as needed  Appointment with  Dr. Okey Dupre requested    Elvina Sidle. [facility] [address] [place], [place] [address] [phone]

## 2022-03-14 ENCOUNTER — Ambulatory Visit (INDEPENDENT_AMBULATORY_CARE_PROVIDER_SITE_OTHER): Payer: 59

## 2022-03-14 DIAGNOSIS — I639 Cerebral infarction, unspecified: Secondary | ICD-10-CM

## 2022-03-17 NOTE — Progress Notes (Signed)
Name: [name] [name]  Age/ Sex: 61 y.o., male   MRN/ DOB: [id number], 04/02/1961     PCP: Owens Loffler, MD   Reason for Endocrinology Evaluation: Type 2 Diabetes Mellitus  Initial Endocrine Consultative Visit: 03/01/2019    PATIENT IDENTIFIER: [name] [name] is a 61 y.o. male with a past medical history of HTN, T2DM,Hx CVA. The patient has followed with Endocrinology clinic since 03/01/2019 for consultative assistance with management of his diabetes.  DIABETIC HISTORY:  [name] [name] was diagnosed with DM 2010, patient intolerant to bromocriptine. His hemoglobin A1c has ranged from 5.9% in 2022, peaking at 10.7% in 2020.   The patient has been followed up with Dr. Loanne Drilling between 2020 until April 2023  SUBJECTIVE:   During the last visit (09/16/2021): Saw Dr. Loanne Drilling  Today (03/18/2022): [name] [name] is here for follow-up on diabetes management. He is accompanied by his spouse today. He checks his blood sugars 1 times daily. The patient has not had hypoglycemic episodes since the last clinic visit  He takes repaglinide 2 hours before breakfast   Has chronic nausea with Trulicity, but no  vomiting of diarrhea      HOME DIABETES REGIMEN:  Metformin 500 mg XR 4 tabs daily  Repaglinide 0.5 mg BID Farxiga 10 mg daily Trulicity 4.5 mg weekly( Saturday)     Statin: Yes ACE-I/ARB: Yes   METER DOWNLOAD SUMMARY: did not bring     DIABETIC COMPLICATIONS: Microvascular complications:  Neuropathy Denies: CKD Last Eye Exam: Completed 03/09/2021  Macrovascular complications:  CVA Denies: CAD, PVD   HISTORY:  Past Medical History:  Past Medical History:  Diagnosis Date   Angio-edema    Anxiety    Depression    Diabetes mellitus type II    type 2   GERD (gastroesophageal reflux disease)    Headache    migraine   History of kidney stones    HLD (hyperlipidemia)    HTN (hypertension)    Morbid obesity (HCC)    Restless legs syndrome (RLS) 11/20/2012   Stroke  ([place]) 02/14/2020   ischemic/right sided affected   Tobacco abuse    Ulcer of left ankle (HCC)    last 2 months dime size dry dressing changing q day   Past Surgical History:  Past Surgical History:  Procedure Laterality Date   Arm surgery Right 1969   Abscess excision and debridement at elbow   COLONOSCOPY WITH PROPOFOL N/A 09/06/2016   Procedure: COLONOSCOPY WITH PROPOFOL;  Surgeon: Doran Stabler, MD;  Location: [facility];  Service: Gastroenterology;  Laterality: N/A;   I & D EXTREMITY Left 05/29/2014   Procedure: IRRIGATION AND DEBRIDEMENT OF LEFT LEG WOUND AND PLACEMENT OF  INTEGRA  AND  VAC;  Surgeon: Theodoro Kos, DO;  Location: [facility];  Service: Plastics;  Laterality: Left;   implantable loop recorder implant  03/06/2020   Medtronic Reveal [place] model LNQ11 (SN  [id number]) implanted by Dr Rayann Heman for cryptogenic stroke   INCISION AND DRAINAGE OF WOUND Left 04/23/2014   Procedure: IRRIGATION AND DEBRIDEMENT LOWER LEFT LEG WOUND WITH PLACEMENT OF INTEGRA AND VAC;  Surgeon: Theodoro Kos, DO;  Location: [facility];  Service: Plastics;  Laterality: Left;   kidney stone removal  0814   MINOR APPLICATION OF WOUND VAC Left 04/23/2014   Procedure: MINOR APPLICATION OF WOUND VAC;  Surgeon: Theodoro Kos, DO;  Location: [facility];  Service: Plastics;  Laterality: Left;   SKIN SPLIT GRAFT Left 06/18/2014  Procedure: SKIN GRAFT SPLIT THICKNESS TO LOWER LEFT LEG WOUND WITH PLACEMENT OF VAC;  Surgeon: Theodoro Kos, DO;  Location: [facility];  Service: Plastics;  Laterality: Left;   VASECTOMY  1994   Social History:  reports that he has been smoking e-cigarettes. He has never used smokeless tobacco. He reports that he does not currently use alcohol. He reports that he does not use drugs. Family History:  Family History  Problem Relation Age of Onset   Stroke Mother        multiple   Leukemia Mother    Pneumonia Mother     Cancer Mother    Diabetes Mother    Diabetes Father    Heart disease Father    Hyperlipidemia Father    Hypertension Father    Allergic rhinitis Neg Hx    Angioedema Neg Hx    Asthma Neg Hx    Eczema Neg Hx    Immunodeficiency Neg Hx    Urticaria Neg Hx      HOME MEDICATIONS: Allergies as of 03/18/2022       Reactions   Cashew Nut Oil Anaphylaxis   Cashews, 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   Pistachio Nut (diagnostic) Hives, Shortness Of Breath, Itching, Swelling, Rash   Latex Dermatitis   Tape Other (See Comments)   Tears and bruises the skin   Betadine [povidone Iodine] Rash, Other (See Comments)   Raised rash 04/23/14   Lidocaine Other (See Comments)   Minimal effect with lidocaine, prefers different anesthetic   Penicillins Rash, Other (See Comments)   Rash all over as a child        Medication List        Accurate as of March 18, 2022 10:04 AM. If you have any questions, ask your nurse or doctor.          acetaminophen 500 MG tablet Commonly known as: TYLENOL Take 1,000 mg by mouth 2 (two) times daily as needed (for pain).   apixaban 5 MG Tabs tablet Commonly known as: Eliquis TAKE 1 TABLET(5 MG) BY MOUTH TWICE DAILY   cetirizine 10 MG tablet Commonly known as: ZYRTEC Take 10 mg by mouth daily.   clonazePAM 0.5 MG tablet Commonly known as: KLONOPIN TAKE 1/2 TO 1 TABLET(0.25 TO 0.5 MG) BY MOUTH TWICE DAILY AS NEEDED FOR ANXIETY   Contour Next Test test strip Generic drug: glucose blood USE TO CHECK BLOOD SUGAR   dapagliflozin propanediol 10 MG Tabs tablet Commonly known as: Farxiga Take 1 tablet (10 mg total) by mouth daily.   diltiazem 30 MG tablet Commonly known as: CARDIZEM TAKE 1 TABLET BY MOUTH EVERY 4 HOURS AS NEEDED FOR HEART RATE>100.   EPINEPHrine 0.3 mg/0.3 mL Soaj injection Commonly known as: EPI-PEN ADMINISTER 0.3 ML IN THE MUSCLE 1 TIME FOR 1 DOSE   FLUoxetine 40 MG capsule Commonly known as: PROZAC Take 2 capsules (80 mg total)  by mouth daily.   lisinopril 40 MG tablet Commonly known as: ZESTRIL TAKE 1 TABLET(40 MG) BY MOUTH DAILY   metFORMIN 500 MG 24 hr tablet Commonly known as: GLUCOPHAGE-XR TAKE 4 TABLETS BY MOUTH DAILY WITH BREAKFAST   Microlet Lancets Misc USE AS DIRECTED TWICE DAILY   multivitamin tablet Take 1 tablet by mouth daily.   pantoprazole 40 MG tablet Commonly known as: PROTONIX TAKE 1 TABLET(40 MG) BY MOUTH DAILY   pentoxifylline 400 MG CR tablet Commonly known as: TRENTAL TAKE 1 TABLET BY MOUTH THREE TIMES DAILY WITH MEALS   pregabalin  300 MG capsule Commonly known as: Lyrica Take 1 capsule (300 mg total) by mouth 2 (two) times daily.   repaglinide 0.5 MG tablet Commonly known as: PRANDIN Take 1 tablet (0.5 mg total) by mouth 2 (two) times daily before a meal.   rOPINIRole 0.5 MG tablet Commonly known as: REQUIP TAKE 1 TABLET(0.5 MG) BY MOUTH TWICE DAILY   rosuvastatin 20 MG tablet Commonly known as: CRESTOR TAKE 1 TABLET(20 MG) BY MOUTH AT BEDTIME   Trulicity 4.5 VP/7.1GG Sopn Generic drug: Dulaglutide Inject 4.5 mg into the skin once a week.   varenicline 1 MG tablet Commonly known as: CHANTIX TAKE 1 TABLET(1 MG) BY MOUTH TWICE DAILY         OBJECTIVE:   Vital Signs: BP 122/70 (BP Location: Left Arm, Patient Position: Sitting, Cuff Size: Large)   Pulse 65   Ht 5' 9.5" (1.765 m)   Wt 293 lb (132.9 kg)   SpO2 97%   BMI 42.65 kg/m   Wt Readings from Last 3 Encounters:  03/18/22 293 lb (132.9 kg)  03/10/22 292 lb 6.4 oz (132.6 kg)  01/19/22 287 lb 6 oz (130.4 kg)     Exam: General: Pt appears well and is in NAD  Neck: General: Supple without adenopathy. Thyroid: Thyroid size normal.  No goiter or nodules appreciated.   Lungs: Clear with good BS bilat   Heart: RRR   Extremities: B/L stasis dermatitis   Neuro: MS is good with appropriate affect, pt is alert and Ox3    DM foot exam:03/18/2022     The skin of the feet is without sores or  ulcerations, but has varicose veins The pedal pulses are 1+ on right and 1+ on left. The sensation is absent  to a screening 5.07, 10 gram monofilament bilaterally    DATA REVIEWED:  Lab Results  Component Value Date   HGBA1C 6.3 (A) 01/19/2022   HGBA1C 6.4 (A) 09/16/2021   HGBA1C 6.4 (H) 07/14/2021    Latest Reference Range & Units 07/14/21 10:00  Sodium 134 - 144 mmol/L 142  Potassium 3.5 - 5.2 mmol/L 4.3  Chloride 96 - 106 mmol/L 104  CO2 20 - 29 mmol/L 27  Glucose 70 - 99 mg/dL 166 (H)  BUN 8 - 27 mg/dL 17  Creatinine 0.76 - 1.27 mg/dL 0.98  Calcium 8.6 - 10.2 mg/dL 9.7  BUN/Creatinine Ratio 10 - 24  17  eGFR >59 mL/min/1.73 88  Alkaline Phosphatase 44 - 121 IU/L 61  Albumin 3.8 - 4.9 g/dL 4.2  AST 0 - 40 IU/L 20  ALT 0 - 44 IU/L 22  Total Protein 6.0 - 8.5 g/dL 6.2  Total Bilirubin 0.0 - 1.2 mg/dL 0.4  BILIRUBIN, DIRECT 0.00 - 0.40 mg/dL 0.15  Total CHOL/HDL Ratio 0.0 - 5.0 ratio 3.5  Cholesterol, Total 100 - 199 mg/dL 134  HDL Cholesterol >39 mg/dL 38 (L)  Triglycerides 0 - 149 mg/dL 231 (H)  VLDL Cholesterol Cal 5 - 40 mg/dL 37  LDL Chol Calc (NIH) 0 - 99 mg/dL 59    In office BG 97 mg/dL    Old records , labs and images have been reviewed.    ASSESSMENT / PLAN / RECOMMENDATIONS:   1) Type 2 Diabetes Mellitus, Optimally controlled, With neuropathic and macrovascular complications - Most recent A1c of 6.0 %. Goal A1c < 7.0 %.    -His A1c is 6.0%, we discussed the concern regarding hypoglycemia.  He tends to take his repaglinide in the morning approximately  2 hours before eating, which would increase his risk of hypoglycemia.  His biggest meal of the day at suppertime, we have opted to discontinue the repaglinide in the morning and just continue to take it before supper, patient was advised to take the repaglinide 20-30 minutes before the supper -He has some nausea with Trulicity, but this is transient.  Used to be on Victoza and Ozempic but had to switch due  to shortage, but they have noticed better clinical outcome with Trulicity rather than the Victoza and the Ozempic -No changes to metformin, Farxiga, and Trulicity  MEDICATIONS: Decrease Repaglinide 0.5 mg , 1 tablet before Supper  Continue Metformin 500 mg XR , 4 tablets daily  Continue Farxiga 10 mg daily  Continue Trulicity 4.5 mg weekly   EDUCATION / INSTRUCTIONS: BG monitoring instructions: Patient is instructed to check his blood sugars 1 times a day, fasting . Call [facility] if: BG persistently < 70  I reviewed the Rule of 15 for the treatment of hypoglycemia in detail with the patient. Literature supplied.    2) Diabetic complications:  Eye: Does not have known diabetic retinopathy.  Neuro/ Feet: Does  have known diabetic peripheral neuropathy .  Renal: Patient does not have known baseline CKD. He   is  on an ACEI/ARB at present.      F/U in 4 months     Signed electronically by: Mack Guise, MD  [facility]  [address] [place], [place] [address] Phone: [phone] FAX: [phone]   CC: Owens Loffler, [place] [address] Phone: [phone]  Fax: [phone]  Return to Endocrinology clinic as below: Future Appointments  Date Time Provider [place]  04/06/2022 11:00 AM Owens Loffler, MD [facility]  04/18/2022  9:30 AM [facility] DEVICE REMOTES [facility]  05/05/2022  2:00 PM End, Harrell Gave, MD [facility] None  05/11/2022  1:30 PM Marzetta Board, DPM [facility]  05/24/2022  8:35 AM [facility] DEVICE REMOTES [facility]  06/10/2022 11:50 AM , Melanie Crazier, MD [facility] None  06/27/2022  7:00 AM [facility] DEVICE REMOTES [facility]  07/06/2022  1:00 PM Garvin Fila, MD [facility] None  07/15/2022  8:30 AM [facility]  07/25/2022  9:40 AM [place],  Frederico Hamman, MD [facility]

## 2022-03-18 ENCOUNTER — Ambulatory Visit (INDEPENDENT_AMBULATORY_CARE_PROVIDER_SITE_OTHER): Payer: Commercial Managed Care - HMO | Admitting: Internal Medicine

## 2022-03-18 ENCOUNTER — Encounter: Payer: Self-pay | Admitting: Internal Medicine

## 2022-03-18 VITALS — BP 122/70 | HR 65 | Ht 69.5 in | Wt 293.0 lb

## 2022-03-18 DIAGNOSIS — E1159 Type 2 diabetes mellitus with other circulatory complications: Secondary | ICD-10-CM

## 2022-03-18 DIAGNOSIS — E1142 Type 2 diabetes mellitus with diabetic polyneuropathy: Secondary | ICD-10-CM | POA: Insufficient documentation

## 2022-03-18 LAB — POCT GLYCOSYLATED HEMOGLOBIN (HGB A1C): Hemoglobin A1C: 6 % — AB (ref 4.0–5.6)

## 2022-03-18 LAB — POCT GLUCOSE (DEVICE FOR HOME USE): POC Glucose: 97 mg/dl (ref 70–99)

## 2022-03-18 MED ORDER — REPAGLINIDE 0.5 MG PO TABS: 0.5000 mg | ORAL_TABLET | Freq: Every day | ORAL | 3 refills | Status: DC

## 2022-03-18 MED ORDER — DAPAGLIFLOZIN PROPANEDIOL 10 MG PO TABS: 10.0000 mg | ORAL_TABLET | Freq: Every day | ORAL | 3 refills | Status: DC

## 2022-03-18 MED ORDER — TRULICITY 4.5 MG/0.5ML ~~LOC~~ SOAJ: 4.5000 mg | SUBCUTANEOUS | 3 refills | Status: DC

## 2022-03-18 MED ORDER — METFORMIN HCL ER 500 MG PO TB24: 2000.0000 mg | ORAL_TABLET | Freq: Every day | ORAL | 3 refills | Status: DC

## 2022-03-18 NOTE — Patient Instructions (Signed)
-   Decrease Repaglinide 0.5 mg , 1 tablet before Supper  - Continue Metformin 500 mg XR , 4 tablets daily  - Continue Farxiga 10 mg daily  - Continue Trulicity 4.5 mg weekly    HOW TO TREAT LOW BLOOD SUGARS (Blood sugar LESS THAN 70 MG/DL) Please follow the RULE OF 15 for the treatment of hypoglycemia treatment (when your (blood sugars are less than 70 mg/dL)   STEP 1: Take 15 grams of carbohydrates when your blood sugar is low, which includes:  3-4 GLUCOSE TABS  OR 3-4 OZ OF JUICE OR REGULAR SODA OR ONE TUBE OF GLUCOSE GEL    STEP 2: RECHECK blood sugar in 15 MINUTES STEP 3: If your blood sugar is still low at the 15 minute recheck --> then, go back to STEP 1 and treat AGAIN with another 15 grams of carbohydrates.

## 2022-04-05 NOTE — Progress Notes (Unsigned)
    [name] Rosier T. Simuel Stebner, MD, [place] [facility] [name] [name], [address]  Phone: [phone]  FAX: [phone]  [name] [name] - 61 y.o. male  MRN [id number]  Date of Birth: 11/11/1960  Date: 04/06/2022  PCP: Owens Loffler, MD  Referral: Owens Loffler, MD  No chief complaint on file.  Subjective:   [name] [name] is a 61 y.o. very pleasant male patient with There is no height or weight on file to calculate BMI. who presents with the following:  Pleasant patient with multiple medical problems who is known well presents predominantly to follow-up on his intermittent severe depression.  Right now he is on Prozac 80 mg each day.  He has been on Prozac for more than 6 months, and it previously had been working well.  He also has some relatively severe anxiety, and all of this is worsened after his multiple strokes.    Review of Systems is noted in the HPI, as appropriate  Objective:   There were no vitals taken for this visit.  GEN: No acute distress; alert,appropriate. PULM: Breathing comfortably in no respiratory distress PSYCH: Normally interactive.   Laboratory and Imaging Data:  Assessment and Plan:   ***

## 2022-04-06 ENCOUNTER — Encounter: Payer: Self-pay | Admitting: Family Medicine

## 2022-04-06 ENCOUNTER — Ambulatory Visit: Payer: Commercial Managed Care - HMO | Admitting: Family Medicine

## 2022-04-06 VITALS — BP 100/68 | HR 70 | Temp 97.9°F | Ht 69.5 in | Wt 287.2 lb

## 2022-04-06 DIAGNOSIS — F410 Panic disorder [episodic paroxysmal anxiety] without agoraphobia: Secondary | ICD-10-CM | POA: Diagnosis not present

## 2022-04-06 DIAGNOSIS — F411 Generalized anxiety disorder: Secondary | ICD-10-CM | POA: Diagnosis not present

## 2022-04-06 DIAGNOSIS — F331 Major depressive disorder, recurrent, moderate: Secondary | ICD-10-CM | POA: Diagnosis not present

## 2022-04-06 DIAGNOSIS — E1142 Type 2 diabetes mellitus with diabetic polyneuropathy: Secondary | ICD-10-CM | POA: Diagnosis not present

## 2022-04-06 DIAGNOSIS — Z23 Encounter for immunization: Secondary | ICD-10-CM

## 2022-04-06 MED ORDER — BUSPIRONE HCL 7.5 MG PO TABS: 7.5000 mg | ORAL_TABLET | Freq: Two times a day (BID) | ORAL | 3 refills | Status: DC

## 2022-04-06 MED ORDER — BUSPIRONE HCL 5 MG PO TABS: 5.0000 mg | ORAL_TABLET | Freq: Two times a day (BID) | ORAL | 0 refills | Status: DC

## 2022-04-07 ENCOUNTER — Encounter: Payer: Self-pay | Admitting: *Deleted

## 2022-04-07 NOTE — Progress Notes (Signed)
[facility] Summary Report / Loop Recorder 

## 2022-04-18 ENCOUNTER — Ambulatory Visit (INDEPENDENT_AMBULATORY_CARE_PROVIDER_SITE_OTHER): Payer: 59

## 2022-04-18 DIAGNOSIS — I639 Cerebral infarction, unspecified: Secondary | ICD-10-CM

## 2022-04-19 LAB — CUP PACEART REMOTE DEVICE CHECK
Date Time Interrogation Session: 20231119232402
Implantable Pulse Generator Implant Date: 20211008

## 2022-05-04 ENCOUNTER — Other Ambulatory Visit: Payer: Self-pay | Admitting: Family Medicine

## 2022-05-05 ENCOUNTER — Ambulatory Visit: Payer: Commercial Managed Care - HMO | Attending: Internal Medicine | Admitting: Internal Medicine

## 2022-05-05 ENCOUNTER — Encounter: Payer: Self-pay | Admitting: Internal Medicine

## 2022-05-05 VITALS — BP 120/70 | HR 74 | Ht 69.0 in | Wt 292.1 lb

## 2022-05-05 DIAGNOSIS — I739 Peripheral vascular disease, unspecified: Secondary | ICD-10-CM

## 2022-05-05 DIAGNOSIS — E785 Hyperlipidemia, unspecified: Secondary | ICD-10-CM

## 2022-05-05 DIAGNOSIS — I480 Paroxysmal atrial fibrillation: Secondary | ICD-10-CM | POA: Diagnosis not present

## 2022-05-05 DIAGNOSIS — R072 Precordial pain: Secondary | ICD-10-CM | POA: Diagnosis not present

## 2022-05-05 DIAGNOSIS — E1169 Type 2 diabetes mellitus with other specified complication: Secondary | ICD-10-CM

## 2022-05-05 DIAGNOSIS — I10 Essential (primary) hypertension: Secondary | ICD-10-CM | POA: Diagnosis not present

## 2022-05-05 NOTE — Progress Notes (Signed)
New Outpatient Visit Date: 05/05/2022  Primary Care Provider: Hannah Beatopland, [place], MD [address] [place],  [place] [id number]  Chief Complaint: Paroxysmal atrial fibrillation  HPI:  Mr. Zenda AlpersSawyer is a 61 y.o. male who is being seen today for the evaluation of paroxysmal atrial fibrillation.  He has a history of paroxysmal atrial fibrillation, peripheral arterial disease, hypertension, hyperlipidemia, type 2 diabetes mellitus, and stroke in 01/2020.  He underwent loop recorder implantation and was subsequently found to have paroxysmal atrial fibrillation in 08/2020.  He was started on anticoagulation through the atrial fibrillation clinic, having last been seen in October.  He has asked to establish care with me, as I am his wife's cardiologist.  Today, Mr. Zenda AlpersSawyer has multiple concerns.  He complains of intermittent discomfort in his chest, almost as if he is being "hit across the chest."  This sensation is then followed by a "funny feeling" in his head.  It is reminiscent of what he experienced a few years ago when he had an anaphylactic reaction at work and almost blacked out.  However, he has not passed out.  Episodes happen every few days, most recently 2 days ago, with the chest discomfort lasting a few seconds to a couple of minutes.  The symptoms are not exertional, though he notes some shortness of breath and chest tightness when he "overdoes it."  The symptoms have been present ever since his initial stroke in 2021.  He believes that his severe anxiety is also playing a role in his symptoms.  He notes occasional racing of his heartbeat.  He also has chronic pain in both legs for which she has been on Pentoxil filing for many years.  He reports history of a femoropopliteal bypass though I do not see mention of this in prior notes, including most recent vascular surgery note from 2015 (note from Dr. Hart RochesterLawson in 04/2014 makes note of laser ablation to the left great saphenous vein).  He reports  having had a stress test more than 10 years ago but does not recall specifics.  He also notes that he was told that his EKGs have shown prior heart attacks, though he denies ever having been hospitalized for an MI or having undergone coronary angiography.  --------------------------------------------------------------------------------------------------   Past Medical History:  Diagnosis Date   Angio-edema    Anxiety    Depression    Diabetes mellitus type II    type 2   GERD (gastroesophageal reflux disease)    Headache    migraine   History of kidney stones    HLD (hyperlipidemia)    HTN (hypertension)    Morbid obesity (HCC)    Paroxysmal atrial fibrillation (HCC)    Restless legs syndrome (RLS) 11/20/2012   Stroke (HCC) 02/14/2020   ischemic/right sided affected   Tobacco abuse    Ulcer of left ankle (HCC)    last 2 months dime size dry dressing changing q day    Past Surgical History:  Procedure Laterality Date   Arm surgery Right 1969   Abscess excision and debridement at elbow   COLONOSCOPY WITH PROPOFOL N/A 09/06/2016   Procedure: COLONOSCOPY WITH PROPOFOL;  Surgeon: Sherrilyn RistHenry L Danis III, MD;  Location: [facility];  Service: Gastroenterology;  Laterality: N/A;   I & D EXTREMITY Left 05/29/2014   Procedure: IRRIGATION AND DEBRIDEMENT OF LEFT LEG WOUND AND PLACEMENT OF  INTEGRA  AND  VAC;  Surgeon: Wayland Denislaire Sanger, DO;  Location: [facility];  Service: Plastics;  Laterality: Left;   implantable loop recorder implant  03/06/2020   Medtronic Reveal Cantrall model LNQ11 (SN  [id number]) implanted by Dr Johney Frame for cryptogenic stroke   INCISION AND DRAINAGE OF WOUND Left 04/23/2014   Procedure: IRRIGATION AND DEBRIDEMENT LOWER LEFT LEG WOUND WITH PLACEMENT OF INTEGRA AND VAC;  Surgeon: Wayland Denis, DO;  Location: [facility];  Service: Plastics;  Laterality: Left;   kidney stone removal  2004   MINOR APPLICATION OF WOUND VAC Left 04/23/2014    Procedure: MINOR APPLICATION OF WOUND VAC;  Surgeon: Wayland Denis, DO;  Location: [facility];  Service: Plastics;  Laterality: Left;   SKIN SPLIT GRAFT Left 06/18/2014   Procedure: SKIN GRAFT SPLIT THICKNESS TO LOWER LEFT LEG WOUND WITH PLACEMENT OF VAC;  Surgeon: Wayland Denis, DO;  Location: [facility];  Service: Plastics;  Laterality: Left;   VASECTOMY  1994    Current Meds  Medication Sig   acetaminophen (TYLENOL) 500 MG tablet Take 1,000 mg by mouth 2 (two) times daily as needed (for pain).   apixaban (ELIQUIS) 5 MG TABS tablet TAKE 1 TABLET(5 MG) BY MOUTH TWICE DAILY   busPIRone (BUSPAR) 10 MG tablet Take 10 mg by mouth 2 (two) times daily.   cetirizine (ZYRTEC) 10 MG tablet Take 10 mg by mouth daily.   clonazePAM (KLONOPIN) 0.5 MG tablet TAKE 1/2 TO 1 TABLET(0.25 TO 0.5 MG) BY MOUTH TWICE DAILY AS NEEDED FOR ANXIETY   dapagliflozin propanediol (FARXIGA) 10 MG TABS tablet Take 1 tablet (10 mg total) by mouth daily.   diltiazem (CARDIZEM) 30 MG tablet TAKE 1 TABLET BY MOUTH EVERY 4 HOURS AS NEEDED FOR HEART RATE>100.   Dulaglutide (TRULICITY) 4.5 MG/0.5ML SOPN Inject 4.5 mg into the skin once a week.   FLUoxetine (PROZAC) 40 MG capsule Take 2 capsules (80 mg total) by mouth daily.   lisinopril (ZESTRIL) 40 MG tablet TAKE 1 TABLET(40 MG) BY MOUTH DAILY   metFORMIN (GLUCOPHAGE-XR) 500 MG 24 hr tablet Take 4 tablets (2,000 mg total) by mouth daily in the afternoon.   Multiple Vitamin (MULTIVITAMIN) tablet Take 1 tablet by mouth daily.   pantoprazole (PROTONIX) 40 MG tablet TAKE 1 TABLET(40 MG) BY MOUTH DAILY   pentoxifylline (TRENTAL) 400 MG CR tablet TAKE 1 TABLET BY MOUTH THREE TIMES DAILY WITH MEALS   pregabalin (LYRICA) 300 MG capsule Take 1 capsule (300 mg total) by mouth 2 (two) times daily.   repaglinide (PRANDIN) 0.5 MG tablet Take 1 tablet (0.5 mg total) by mouth daily before supper.   rOPINIRole (REQUIP) 0.5 MG tablet TAKE 1 TABLET(0.5 MG) BY MOUTH  TWICE DAILY   rosuvastatin (CRESTOR) 20 MG tablet TAKE 1 TABLET(20 MG) BY MOUTH AT BEDTIME   varenicline (CHANTIX) 1 MG tablet TAKE 1 TABLET(1 MG) BY MOUTH TWICE DAILY    Allergies: Cashew nut oil, Pistachio nut (diagnostic), Latex, Tape, Betadine [povidone iodine], Lidocaine, and Penicillins  Social History   Tobacco Use   Smoking status: Every Day    Types: E-cigarettes    Start date: 05/05/2021   Smokeless tobacco: Never   Tobacco comments:    half pack daily  Vaping Use   Vaping Use: Every day   Substances: Flavoring  Substance Use Topics   Alcohol use: Not Currently    Alcohol/week: 0.0 standard drinks of alcohol    Comment: rarely   Drug use: No    Family History  Problem Relation Age of Onset   Stroke Mother  multiple   Leukemia Mother    Pneumonia Mother    Cancer Mother    Diabetes Mother    Diabetes Father    Heart disease Father    Hyperlipidemia Father    Hypertension Father    Allergic rhinitis Neg Hx    Angioedema Neg Hx    Asthma Neg Hx    Eczema Neg Hx    Immunodeficiency Neg Hx    Urticaria Neg Hx     Review of Systems: A 12-system review of systems was performed and was negative except as noted in the HPI.  --------------------------------------------------------------------------------------------------  Physical Exam: BP 120/70 (BP Location: Right Arm, Patient Position: Sitting, Cuff Size: Large)   Pulse 74   Ht 5\' 9"  (1.753 m)   Wt 292 lb 2 oz (132.5 kg)   SpO2 96%   BMI 43.14 kg/m   General:  NAD.  Accompanied by his wife HEENT: No conjunctival pallor or scleral icterus. Neck: Supple without lymphadenopathy, thyromegaly, JVD, or HJR. No carotid bruit, though body habitus limits evaluation. Lungs: Normal work of breathing. Clear to auscultation bilaterally without wheezes or crackles. Heart: Regular rate and rhythm without murmurs, rubs, or gallops.  Unable to assess PMI due to body habitus.  Abd: Bowel sounds present. Soft,  NT/ND.  And able to assess HSM due to body habitus. Ext: 1-2+ chronic appearing pretibial edema bilaterally.  1+ pedal pulses bilaterally. Skin: Warm and dry without rash. Neuro: CNIII-XII intact. Strength and fine-touch sensation intact in upper and lower extremities bilaterally. Psych: Anxious mood reported.  EKG: Normal sinus rhythm with left axis deviation, right bundle branch block, poor R wave progression, and possible inferior infarct.  No significant change from 03/10/2022.  Lab Results  Component Value Date   WBC 8.8 07/14/2021   HGB 16.1 07/14/2021   HCT 47.5 07/14/2021   MCV 87 07/14/2021   PLT 207 07/14/2021    Lab Results  Component Value Date   NA 142 07/14/2021   K 4.3 07/14/2021   CL 104 07/14/2021   CO2 27 07/14/2021   BUN 17 07/14/2021   CREATININE 0.98 07/14/2021   GLUCOSE 166 (H) 07/14/2021   ALT 22 07/14/2021    Lab Results  Component Value Date   CHOL 134 07/14/2021   HDL 38 (L) 07/14/2021   LDLCALC 59 07/14/2021   LDLDIRECT 76 02/19/2014   TRIG 231 (H) 07/14/2021   CHOLHDL 3.5 07/14/2021    --------------------------------------------------------------------------------------------------  ASSESSMENT AND PLAN: Paroxysmal atrial fibrillation: Mr. Tegtmeyer remains in sinus rhythm today.  He notes occasional elevated heart rates but has not been bothered by persistent palpitations.  Recent ILR interrogations have not shown significant atrial fibrillation.  Given history of multiple strokes in the setting of PAF, we will continue with indefinite anticoagulation with apixaban as well as as needed diltiazem for breakthrough palpitations.  Chest pain: Mr. Gilberg reports 2 distinct types of chest discomfort.  He randomly feels as though he has been hit in the chest and subsequently has lightheadedness.  He also reports exertional dyspnea and chest tightness when he overexerts himself.  He is certainly at risk for ischemic heart disease given his history of  diabetes mellitus, hypertension, hyperlipidemia, prior tobacco use, morbid obesity, and questionable PAD.  We have discussed further evaluation options and will obtain a myocardial PET/CT stress test.  We will defer aspirin therapy in lieu of anticoagulation with apixaban.  Claudication/PAD: Mr. Berry is on Pentoxil filing that he reports is being used for claudication.  He also notes a femoropopliteal bypass though I do not see any mention of this in his notes.  Last vascular surgery visit from 2015 reports left greater saphenous vein laser ablation but no arterial intervention.  We will obtain bilateral lower extremity ABIs and arterial Dopplers for further evaluation.  If he does not have significant disease, I would favor discontinuation of Pentoxil filing to minimize risk for bleeding in the setting of long-term anticoagulation with apixaban.  If there is evidence of significant PAD, consultation with Dr. Kirke Corin will be considered.  Hypertension: Blood pressure well-controlled today.  No medication changes at this time.  Hyperlipidemia associated with type 2 diabetes mellitus: LDL well-controlled on last check in February, the triglycerides were mildly elevated.  Continue rosuvastatin 20 mg daily with further lifestyle modifications aimed at lowering triglycerides.  If there is evidence of significant ASCVD on aforementioned testing, escalation of rosuvastatin and/or addition of Vascepa will need to be considered in the future.  Shared Decision Making/Informed Consent The risks [chest pain, shortness of breath, cardiac arrhythmias, dizziness, blood pressure fluctuations, myocardial infarction, stroke/transient ischemic attack, nausea, vomiting, allergic reaction, radiation exposure, metallic taste sensation and life-threatening complications (estimated to be 1 in 10,000)], benefits (risk stratification, diagnosing coronary artery disease, treatment guidance) and alternatives of a cardiac PET stress  test were discussed in detail with Mr. Canupp and he agrees to proceed.  Follow-up: Return to clinic in 2 months.  Yvonne Kendall, MD 05/05/2022 2:36 PM

## 2022-05-05 NOTE — Patient Instructions (Addendum)
Medication Instructions:  Your Physician recommend you continue on your current medication as directed.    *If you need a refill on your cardiac medications before your next appointment, please call your pharmacy*   Lab Work: None ordered today   Testing/Procedures: Your physician has requested that you have an ankle brachial index (ABI). During this test an ultrasound and blood pressure cuff are used to evaluate the arteries that supply the arms and legs with blood.  Allow thirty minutes for this exam.  There are no restrictions or special instructions.  This will take place at [address] [address], [place] [address]   Your physician has requested that you have a lower extremity arterial duplex. During this test, ultrasound is used to evaluate arterial blood flow in the legs. Allow one hour for this exam. There are no restrictions or special instructions. This will take place at [address] ([facility], [place] [address]  Cardiac PET (see instructions below)   Follow-Up: At [facility], you and your health needs are our priority.  As part of our continuing mission to provide you with exceptional heart care, we have created designated Provider Care Teams.  These Care Teams include your primary Cardiologist (physician) and Advanced Practice Providers (APPs -  Physician Assistants and Nurse Practitioners) who all work together to provide you with the care you need, when you need it.  We recommend signing up for the patient portal called "MyChart".  Sign up information is provided on this After Visit Summary.  MyChart is used to connect with patients for Virtual Visits (Telemedicine).  Patients are able to view lab/test results, encounter notes, upcoming appointments, etc.  Non-urgent messages can be sent to your provider as well.   To learn more about what you can do with MyChart, go to [email or web address].    Your next  appointment:   2 month(s)  The format for your next appointment:   In Person  Provider:   You may see Yvonne Kendall, MD or one of the following Advanced Practice Providers on your designated Care Team:   Nicolasa Ducking, NP Eula Listen, PA-C Cadence Fransico Michael, PA-C Charlsie Quest, NP    How to Prepare for Your Cardiac PET/CT Stress Test:  1. Please do not take these medications before your test:   Medications that may interfere with the cardiac pharmacological stress agent (ex. nitrates - including erectile dysfunction medications or beta-blockers) the day of the exam. (Erectile dysfunction medication should be held for at least 72 hrs prior to test) Theophylline containing medications for 12 hours. Dipyridamole 48 hours prior to the test. Your remaining medications may be taken with water.  2. Nothing to eat or drink, except water, 3 hours prior to arrival time.   NO caffeine/decaffeinated products, or chocolate 12 hours prior to arrival.  3. NO perfume, cologne or lotion  4. Total time is 1 to 2 hours; you may want to bring reading material for the waiting time.  5. Please report to Admitting at the [facility] Main Entrance 60 minutes early for your test.  [address] [place], [place] [address]  Diabetic Preparation:  Hold oral medications.( Metformin morning of procedure) You may take NPH and Lantus insulin. Do not take Humalog or Humulin R (Regular Insulin) the day of your test. Check blood sugars prior to leaving the house. If able to eat breakfast prior to 3 hour fasting, you may take all medications, including your  insulin, Do not worry if you miss your breakfast dose of insulin - start at your next meal.  IF YOU THINK YOU MAY BE PREGNANT, OR ARE NURSING PLEASE INFORM THE TECHNOLOGIST.  In preparation for your appointment, medication and supplies will be purchased.  Appointment availability is limited, so if you need to cancel or reschedule, please call  the Radiology Department at [phone]  24 hours in advance to avoid a cancellation fee of $100.00  What to Expect After you Arrive:  Once you arrive and check in for your appointment, you will be taken to a preparation room within the Radiology Department.  A technologist or Nurse will obtain your medical history, verify that you are correctly prepped for the exam, and explain the procedure.  Afterwards,  an IV will be started in your arm and electrodes will be placed on your skin for EKG monitoring during the stress portion of the exam. Then you will be escorted to the PET/CT scanner.  There, staff will get you positioned on the scanner and obtain a blood pressure and EKG.  During the exam, you will continue to be connected to the EKG and blood pressure machines.  A small, safe amount of a radioactive tracer will be injected in your IV to obtain a series of pictures of your heart along with an injection of a stress agent.    After your Exam:  It is recommended that you eat a meal and drink a caffeinated beverage to counter act any effects of the stress agent.  Drink plenty of fluids for the remainder of the day and urinate frequently for the first couple of hours after the exam.  Your doctor will inform you of your test results within 7-10 business days.  For questions about your test or how to prepare for your test, please call: Rockwell Alexandria, Cardiac Imaging Nurse Navigator  Larey Brick, Cardiac Imaging Nurse Navigator Office: [phone]

## 2022-05-07 ENCOUNTER — Encounter: Payer: Self-pay | Admitting: Internal Medicine

## 2022-05-07 DIAGNOSIS — I739 Peripheral vascular disease, unspecified: Secondary | ICD-10-CM | POA: Insufficient documentation

## 2022-05-07 DIAGNOSIS — R072 Precordial pain: Secondary | ICD-10-CM | POA: Insufficient documentation

## 2022-05-07 HISTORY — DX: Peripheral vascular disease, unspecified: I73.9

## 2022-05-09 ENCOUNTER — Other Ambulatory Visit: Payer: Self-pay | Admitting: Family Medicine

## 2022-05-10 MED ORDER — EPINEPHRINE 0.3 MG/0.3ML IJ SOAJ: INTRAMUSCULAR | 0 refills | Status: DC

## 2022-05-11 ENCOUNTER — Ambulatory Visit (INDEPENDENT_AMBULATORY_CARE_PROVIDER_SITE_OTHER): Payer: Commercial Managed Care - HMO | Admitting: Podiatry

## 2022-05-11 VITALS — BP 119/73

## 2022-05-11 DIAGNOSIS — B351 Tinea unguium: Secondary | ICD-10-CM | POA: Diagnosis not present

## 2022-05-11 DIAGNOSIS — I739 Peripheral vascular disease, unspecified: Secondary | ICD-10-CM | POA: Diagnosis not present

## 2022-05-11 DIAGNOSIS — M79676 Pain in unspecified toe(s): Secondary | ICD-10-CM

## 2022-05-11 DIAGNOSIS — E1142 Type 2 diabetes mellitus with diabetic polyneuropathy: Secondary | ICD-10-CM

## 2022-05-11 MED ORDER — EPINEPHRINE 0.3 MG/0.3ML IJ SOAJ: 0.3000 mg | INTRAMUSCULAR | 0 refills | Status: DC | PRN

## 2022-05-11 NOTE — Progress Notes (Signed)
  Subjective:  Patient ID: [name] [name], male    DOB: Dec 25, 1960,  MRN: [id number]  [name] [name] presents to clinic today for at risk footcare. Patient has h/o diabetes, neuropathy and PAD and is seen for  and painful elongated mycotic toenails 1-5 bilaterally which are tender when wearing enclosed shoe gear. Pain is relieved with periodic professional debridement.  Chief Complaint  Patient presents with   Diabetes    Diabetic foot care, A1c- 6.7 last seen PCP 04/06/2022   New problem(s): None.   PCP is Copland, Karleen Hampshire, MD.  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   Pistachio Nut (Diagnostic) Hives, Shortness Of Breath, Itching, Swelling and Rash   Latex Dermatitis   Tape Other (See Comments)    Tears and bruises the skin   Betadine [Povidone Iodine] Rash and Other (See Comments)    Raised rash 04/23/14   Lidocaine Other (See Comments)    Minimal effect with lidocaine, prefers different anesthetic   Penicillins Rash and Other (See Comments)    Rash all over as a child    Review of Systems: Negative except as noted in the HPI.  Objective: No changes noted in today's physical examination. Vitals:   05/11/22 1348  BP: 119/73   [name] [name] is a pleasant 61 y.o. male morbidly obese in NAD. AAO x 3.  Vascular Examination: Vascular status intact b/l with palpable pedal pulses. Pedal hair present b/l. CFT immediate b/l. No edema. No pain with calf compression b/l. Skin temperature gradient WNL b/l. Evidence of chronic venous insufficiency b/l LE.  Neurological Examination: Pt has subjective symptoms of neuropathy. Protective sensation diminished with 10g monofilament b/l. Vibratory sensation diminished b/l.  Dermatological Examination: Pedal skin is warm and supple b/l LE. Scarring noted. No open wounds b/l LE. No interdigital macerations noted b/l LE. Toenails 1-5 bilaterally elongated, discolored, dystrophic,  thickened, and crumbly with subungual debris and tenderness to dorsal palpation.   Musculoskeletal Examination: Muscle strength 5/5 to b/l LE. Hammertoe deformity noted 2-5 b/l. Charcot deformity noted right foot. Patient ambulates with cane assistance.  Radiographs: None  Assessment/Plan: 1. Pain due to onychomycosis of toenail   2. PVD (peripheral vascular disease) (HCC)   3. Diabetic polyneuropathy associated with type 2 diabetes mellitus (HCC)     No orders of the defined types were placed in this encounter.   -Patient was evaluated and treated. All patient's and/or POA's questions/concerns answered on today's visit. -No new findings. No new orders. -Will check insurance benefits for diabetic shoes as patient has h/o toe ulcer left foot. -Continue foot and shoe inspections daily. Monitor blood glucose per PCP/Endocrinologist's recommendations. -Patient to continue soft, supportive shoe gear daily. -Mycotic toenails 1-5 bilaterally were debrided in length and girth with sterile nail nippers and dremel without incident. -Patient/POA to call should there be question/concern in the interim.   Return in about 3 months (around 08/10/2022).  [name] [name], DPM

## 2022-05-15 ENCOUNTER — Encounter: Payer: Self-pay | Admitting: Podiatry

## 2022-05-16 NOTE — Progress Notes (Signed)
I left a message on his voicemail.

## 2022-05-16 NOTE — Progress Notes (Addendum)
Spoke with Vicente Males at [facility] ref # [phone] . Patient has a deductible of $4500 he has met $2007.89 of his deductible , Christella Scheuermann will not pay anything until his deductible is met, then they will pay 50%.   05/16/22  1:04pm lmom the information that [employer] provided me and if he is interested in proceeding with the shoes to please give our office a call.

## 2022-05-19 ENCOUNTER — Ambulatory Visit (INDEPENDENT_AMBULATORY_CARE_PROVIDER_SITE_OTHER): Payer: Commercial Managed Care - HMO | Admitting: Psychology

## 2022-05-19 DIAGNOSIS — F411 Generalized anxiety disorder: Secondary | ICD-10-CM

## 2022-05-19 DIAGNOSIS — F331 Major depressive disorder, recurrent, moderate: Secondary | ICD-10-CM
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
## 2022-05-20 ENCOUNTER — Other Ambulatory Visit: Payer: Self-pay | Admitting: Family Medicine

## 2022-05-24 ENCOUNTER — Ambulatory Visit (INDEPENDENT_AMBULATORY_CARE_PROVIDER_SITE_OTHER): Payer: 59

## 2022-05-24 DIAGNOSIS — I639 Cerebral infarction, unspecified: Secondary | ICD-10-CM

## 2022-05-24 LAB — CUP PACEART REMOTE DEVICE CHECK
Date Time Interrogation Session: 20231225230414
Implantable Pulse Generator Implant Date: 20211008

## 2022-05-25 ENCOUNTER — Other Ambulatory Visit: Payer: Self-pay | Admitting: Family Medicine

## 2022-05-25 NOTE — Telephone Encounter (Signed)
Last office visit 04/06/22 for Depression.  Last refilled 02/02/22 for #60 with 3 refills.  CPE scheduled for 07/25/22.

## 2022-05-26 ENCOUNTER — Ambulatory Visit: Payer: Commercial Managed Care - HMO | Admitting: Psychology

## 2022-05-27 NOTE — Progress Notes (Signed)
[facility] Summary Report / Loop Recorder 

## 2022-05-29 ENCOUNTER — Other Ambulatory Visit: Payer: Self-pay | Admitting: Internal Medicine

## 2022-05-29 ENCOUNTER — Other Ambulatory Visit: Payer: Self-pay | Admitting: Family Medicine

## 2022-05-29 NOTE — Telephone Encounter (Signed)
Can you call [name] (or [name]) - since he is seeing psychiatry now, Dr. Maryruth Bun needs to be the MD prescribing his psychiatric drugs - like Prozac.  She can make adjustments.

## 2022-06-01 NOTE — Telephone Encounter (Signed)
Spoke to patient's wife and was advised that she does not know why the refill request was sent to Dr. Lorelei Pont. [name] [name] stated that her husband gets his medication from Dr. Nicolasa Ducking and does not need a refill at this time. [name] [name] stated that she is going to talk with the someone at the pharmacy and let them know not to send the request for fluoxetine to this office in the future. Refill denied.

## 2022-06-01 NOTE — Telephone Encounter (Signed)
Can you follow-up on this message from a few days ago?  I need to communicate with them that they should be communicating with Dr. Nicolasa Ducking for his psychiatric medications.

## 2022-06-02 ENCOUNTER — Other Ambulatory Visit: Payer: Self-pay | Admitting: Internal Medicine

## 2022-06-02 ENCOUNTER — Ambulatory Visit (INDEPENDENT_AMBULATORY_CARE_PROVIDER_SITE_OTHER): Payer: Commercial Managed Care - HMO | Admitting: Psychology

## 2022-06-02 DIAGNOSIS — F331 Major depressive disorder, recurrent, moderate: Secondary | ICD-10-CM

## 2022-06-02 DIAGNOSIS — F411 Generalized anxiety disorder: Secondary | ICD-10-CM | POA: Diagnosis not present

## 2022-06-02 DIAGNOSIS — I480 Paroxysmal atrial fibrillation: Secondary | ICD-10-CM

## 2022-06-02 NOTE — Telephone Encounter (Signed)
Eliquis 5mg  refill request received. Patient is 62 years old, weight-132.5kg, Crea-0.98 on 07/14/2021, Diagnosis-Afib, and last seen by Dr. Saunders Revel on 05/05/2022. Dose is appropriate based on dosing criteria. Will send in refill to requested pharmacy.

## 2022-06-02 NOTE — Progress Notes (Signed)
[facility] Counselor/Therapist Progress Note  Patient ID: [name] [name], MRN: [id number],    Date: 06/02/2022  Time Spent: 45 mins  Treatment Type: Individual Therapy  Reported Symptoms: Pt presents for session, via WebEx video.  Pt grants consent for the session, stating his is in his home with no one else present.  I shared with pt that I am in my office with no one else here either.   Mental Status Exam: Appearance:  Casual     Behavior: Appropriate  Motor: Normal  Speech/Language:  Clear and Coherent  Affect: Depressed and Flat  Mood: depressed  Thought process: normal  Thought content:   WNL  Sensory/Perceptual disturbances:   WNL  Orientation: oriented to person, place, and situation  Attention: Good  Concentration: Fair  Memory: Recent;   [place] of knowledge:  Good  Insight:   Good  Judgment:  Good  Impulse Control: Good   Risk Assessment: Danger to Self:  No Self-injurious Behavior: No Danger to Others: No Duty to Warn:no Physical Aggression / Violence:No  Access to Firearms a concern: No  Gang Involvement:No   Subjective: Pt shares that he and [name] [name] got together with his daughters on Christmas Eve and Christmas Day and New Year's Eve.  Pt has three grown daughters and all of them live within 30 mins of him; one lives with them [name] [name]), one lives down the street [name] [name]) and his third daughter lives about 30 mins away [name] [name]).  Pt has no grandchildren yet.  [name] [name] does not drive so [name] [name] has to take her to work and pick her up everyday.  Pt had stroke 02/14/20; "the first set of strokes should have killed me but they didn't.  My doctors tell me I am a miracle because I am still here."  He had a second set of strokes (07/2020) that effected his vision and his ability to walk (uses a cane).  His vision is effected on the right side of what he is looking at.  He also has to focus very hard to remember things now.  He can no longer read because  of his vision.  He is no longer doing PT, ST, and OT because he has finished his progression.  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  Interventions: Cognitive Behavioral Therapy  Diagnosis:Moderate episode of recurrent major depressive disorder ([facility])  Generalized anxiety disorder  Plan: Treatment Plan Strengths/Abilities:  Intelligent, Intuitive, Willing to participate in therapy Treatment Preferences:  Outpatient Individual Therapy Statement of Needs:  Patient is to use CBT, mindfulness and coping skills to help manage and/or decrease symptoms associated with their diagnosis. Symptoms:  Depressed/Irritable mood, worry, social withdrawal Problems Addressed:  Depressive thoughts, Sadness, Sleep issues, etc. Long Term Goals:  Pt to reduce overall level, frequency, and intensity of the feelings of depression/anxiety as evidenced by decreased irritability, negative self talk, and helpless feelings from 6 to 7 days/week to 0 to 1 days/week, per client report, for at least 3 consecutive months.  Progress: 20% Short Term Goals:  Pt to verbally express understanding of the relationship between feelings of depression/anxiety and their impact on thinking patterns and behaviors.  Pt to verbalize an understanding of the role that distorted thinking plays in creating fears, excessive worry, and ruminations.  Progress: 20% Target Date:  06/03/2023 Frequency:  Bi-weekly Modality:  Cognitive Behavioral Therapy Interventions by Therapist:  Therapist will use CBT, Mindfulness exercises, Coping skills and Referrals, as needed by client. Client has verbally approved this treatment plan.  [name] [name], [facility]

## 2022-06-10 ENCOUNTER — Ambulatory Visit (INDEPENDENT_AMBULATORY_CARE_PROVIDER_SITE_OTHER): Payer: Commercial Managed Care - HMO | Admitting: Psychology

## 2022-06-10 ENCOUNTER — Encounter: Payer: Self-pay | Admitting: Internal Medicine

## 2022-06-10 ENCOUNTER — Ambulatory Visit: Payer: Commercial Managed Care - HMO | Admitting: Internal Medicine

## 2022-06-10 VITALS — BP 130/78 | HR 77 | Ht 69.0 in | Wt 290.2 lb

## 2022-06-10 DIAGNOSIS — F411 Generalized anxiety disorder: Secondary | ICD-10-CM | POA: Diagnosis not present

## 2022-06-10 DIAGNOSIS — E1142 Type 2 diabetes mellitus with diabetic polyneuropathy: Secondary | ICD-10-CM

## 2022-06-10 DIAGNOSIS — E1159 Type 2 diabetes mellitus with other circulatory complications: Secondary | ICD-10-CM

## 2022-06-10 DIAGNOSIS — F331 Major depressive disorder, recurrent, moderate: Secondary | ICD-10-CM

## 2022-06-10 LAB — POCT GLYCOSYLATED HEMOGLOBIN (HGB A1C): Hemoglobin A1C: 6.7 % — AB (ref 4.0–5.6)

## 2022-06-10 LAB — POCT GLUCOSE (DEVICE FOR HOME USE): POC Glucose: 124 mg/dl — AB (ref 70–99)

## 2022-06-10 NOTE — Progress Notes (Unsigned)
Name: [name] [name]  Age/ Sex: 62 y.o., male   MRN/ DOB: [id number], 04-03-1961     PCP: [name] Loffler, MD   Reason for Endocrinology Evaluation: Type 2 Diabetes Mellitus  Initial Endocrine Consultative Visit: 03/01/2019    PATIENT IDENTIFIER: [name] is a 62 y.o. male with a past medical history of HTN, T2DM,Hx CVA. The patient has followed with Endocrinology clinic since 03/01/2019 for consultative assistance with management of his diabetes.  DIABETIC HISTORY:  [name] was diagnosed with DM 2010, patient intolerant to bromocriptine. His hemoglobin A1c has ranged from 5.9% in 2022, peaking at 10.7% in 2020.   The patient has been followed up with Dr. Loanne [name] between 2020 until April 2023  SUBJECTIVE:   During the last visit (03/18/2022): A1c 6.0%     Today (06/10/2022): [name] is here for follow-up on diabetes management. He is accompanied by his spouse today. He checks his blood sugars 1 times daily. The patient has not had hypoglycemic episodes since the last clinic visit  He takes repaglinide 2 hours before breakfast   Has chronic nausea with Trulicity, but no  vomiting of diarrhea   Was seen by podiatry on 05/11/2022   Pt endorses feeling poorly with BG's in 160's   HOME DIABETES REGIMEN:  Metformin 500 mg XR 4 tabs daily  Repaglinide 0.5 mg , 1 tab before supper Farxiga 10 mg daily Trulicity 4.5 mg weekly( Saturday)    Statin: Yes ACE-I/ARB: Yes   METER DOWNLOAD SUMMARY: did not bring     DIABETIC COMPLICATIONS: Microvascular complications:  Neuropathy Denies: CKD Last Eye Exam: Completed 03/09/2021  Macrovascular complications:  CVA Denies: CAD, PVD   HISTORY:  Past Medical History:  Past Medical History:  Diagnosis Date   Angio-edema    Anxiety    Depression    Diabetes mellitus type II    type 2   GERD (gastroesophageal reflux disease)    Headache    migraine   History of kidney stones    HLD (hyperlipidemia)     HTN (hypertension)    Morbid obesity (HCC)    Paroxysmal atrial fibrillation (HCC)    Restless legs syndrome (RLS) 11/20/2012   Stroke ([place]) 02/14/2020   ischemic/right sided affected   Tobacco abuse    Ulcer of left ankle (HCC)    last 2 months dime size dry dressing changing q day   Past Surgical History:  Past Surgical History:  Procedure Laterality Date   Arm surgery Right 1969   Abscess excision and debridement at elbow   COLONOSCOPY WITH PROPOFOL N/A 09/06/2016   Procedure: COLONOSCOPY WITH PROPOFOL;  Surgeon: Doran Stabler, MD;  Location: [facility];  Service: Gastroenterology;  Laterality: N/A;   I & D EXTREMITY Left 05/29/2014   Procedure: IRRIGATION AND DEBRIDEMENT OF LEFT LEG WOUND AND PLACEMENT OF  INTEGRA  AND  VAC;  Surgeon: Theodoro Kos, DO;  Location: [facility];  Service: Plastics;  Laterality: Left;   implantable loop recorder implant  03/06/2020   Medtronic Reveal [place] model LNQ11 (SN  [id number]) implanted by Dr [name] [name] for cryptogenic stroke   INCISION AND DRAINAGE OF WOUND Left 04/23/2014   Procedure: IRRIGATION AND DEBRIDEMENT LOWER LEFT LEG WOUND WITH PLACEMENT OF INTEGRA AND VAC;  Surgeon: Theodoro Kos, DO;  Location: [facility];  Service: Plastics;  Laterality: Left;   kidney stone removal  [id number]   MINOR APPLICATION OF WOUND VAC Left 04/23/2014   Procedure: MINOR APPLICATION OF  WOUND VAC;  Surgeon: Theodoro Kos, DO;  Location: [facility];  Service: Plastics;  Laterality: Left;   SKIN SPLIT GRAFT Left 06/18/2014   Procedure: SKIN GRAFT SPLIT THICKNESS TO LOWER LEFT LEG WOUND WITH PLACEMENT OF VAC;  Surgeon: Theodoro Kos, DO;  Location: [facility];  Service: Plastics;  Laterality: Left;   VASECTOMY  1994   Social History:  reports that he has been smoking e-cigarettes. He started smoking about 13 months ago. He has never used smokeless tobacco. He reports that he does not currently use alcohol.  He reports that he does not use drugs. 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   Pistachio Nut (diagnostic) Hives, Shortness Of Breath, Itching, Swelling, Rash   Latex Dermatitis   Tape Other (See Comments)   Tears and bruises the skin   Betadine [povidone Iodine] Rash, Other (See Comments)   Raised rash 04/23/14   Lidocaine Other (See Comments)   Minimal effect with lidocaine, prefers different anesthetic   Penicillins Rash, Other (See Comments)   Rash all over as a child        Medication List        Accurate as of June 10, 2022 11:57 AM. If you have any questions, ask your nurse or doctor.          acetaminophen 500 MG tablet Commonly known as: TYLENOL Take 1,000 mg by mouth 2 (two) times daily as needed (for pain).   busPIRone 5 MG tablet Commonly known as: BUSPAR Take 1 tablet (5 mg total) by mouth 2 (two) times daily.   busPIRone 7.5 MG tablet Commonly known as: BUSPAR Take 1 tablet (7.5 mg total) by mouth 2 (two) times daily.   busPIRone 10 MG tablet Commonly known as: BUSPAR Take 10 mg by mouth 2 (two) times daily.   cetirizine 10 MG tablet Commonly known as: ZYRTEC Take 10 mg by mouth daily.   clonazePAM 0.5 MG tablet Commonly known as: KLONOPIN TAKE 1/2 TO 1 TABLET(0.25 TO 0.5 MG) BY MOUTH TWICE DAILY AS NEEDED FOR ANXIETY   Contour Next Test test strip Generic drug: glucose blood USE TO CHECK BLOOD SUGAR   dapagliflozin propanediol 10 MG Tabs  tablet Commonly known as: Farxiga Take 1 tablet (10 mg total) by mouth daily.   diltiazem 30 MG tablet Commonly known as: CARDIZEM TAKE 1 TABLET BY MOUTH EVERY 4 HOURS AS NEEDED FOR HEART RATE>100.   Eliquis 5 MG Tabs tablet Generic drug: apixaban TAKE 1 TABLET(5 MG) BY MOUTH TWICE DAILY   EPINEPHrine 0.3 mg/0.3 mL Soaj injection Commonly known as: EPI-PEN Inject 0.3 mg into the muscle as needed for anaphylaxis.   FLUoxetine 40 MG capsule Commonly known as: PROZAC Take 2 capsules (80 mg total) by mouth daily.  lisinopril 40 MG tablet Commonly known as: ZESTRIL TAKE 1 TABLET(40 MG) BY MOUTH DAILY   metFORMIN 500 MG 24 hr tablet Commonly known as: GLUCOPHAGE-XR Take 4 tablets (2,000 mg total) by mouth daily in the afternoon.   Microlet Lancets Misc USE AS DIRECTED TWICE DAILY   multivitamin tablet Take 1 tablet by mouth daily.   pantoprazole 40 MG tablet Commonly known as: PROTONIX TAKE 1 TABLET(40 MG) BY MOUTH DAILY   pentoxifylline 400 MG CR tablet Commonly known as: TRENTAL TAKE 1 TABLET BY MOUTH THREE TIMES DAILY WITH MEALS   pregabalin 300 MG capsule Commonly known as: LYRICA TAKE 1 CAPSULE(300 MG) BY MOUTH TWICE DAILY   repaglinide 0.5 MG tablet Commonly known as: PRANDIN Take 1 tablet (0.5 mg total) by mouth daily before supper.   rOPINIRole 0.5 MG tablet Commonly known as: REQUIP TAKE 1 TABLET(0.5 MG) BY MOUTH TWICE DAILY   rosuvastatin 20 MG tablet Commonly known as: CRESTOR TAKE 1 TABLET(20 MG) BY MOUTH AT BEDTIME   Trulicity 4.5 YI/9.4WN Sopn Generic drug: Dulaglutide Inject 4.5 mg into the skin once a week.   varenicline 1 MG tablet Commonly known as: CHANTIX TAKE 1 TABLET(1 MG) BY MOUTH TWICE DAILY         OBJECTIVE:   Vital Signs: BP 130/78 (BP Location: Left Arm, Patient Position: Sitting, Cuff Size: Normal)   Pulse 77   Ht 5\' 9"  (1.753 m)   Wt 290 lb 3.2 oz (131.6 kg)   SpO2 94%   BMI 42.86 kg/m   Wt Readings from Last 3  Encounters:  06/10/22 290 lb 3.2 oz (131.6 kg)  05/05/22 292 lb 2 oz (132.5 kg)  04/06/22 287 lb 4 oz (130.3 kg)     Exam: General: Pt appears well and is in NAD  Neck: General: Supple without adenopathy. Thyroid: Thyroid size normal.  No goiter or nodules appreciated.   Lungs: Clear with good BS bilat   Heart: RRR   Extremities: B/L stasis dermatitis   Neuro: MS is good with appropriate affect, pt is alert and Ox3    DM foot exam:03/18/2022     The skin of the feet is without sores or ulcerations, but has varicose veins The pedal pulses are 1+ on right and 1+ on left. The sensation is absent  to a screening 5.07, 10 gram monofilament bilaterally    DATA REVIEWED:  Lab Results  Component Value Date   HGBA1C 6.7 (A) 06/10/2022   HGBA1C 6.0 (A) 03/18/2022   HGBA1C 6.3 (A) 01/19/2022    Latest Reference Range & Units 07/14/21 10:00  Sodium 134 - 144 mmol/L 142  Potassium 3.5 - 5.2 mmol/L 4.3  Chloride 96 - 106 mmol/L 104  CO2 20 - 29 mmol/L 27  Glucose 70 - 99 mg/dL 166 (H)  BUN 8 - 27 mg/dL 17  Creatinine 0.76 - 1.27 mg/dL 0.98  Calcium 8.6 - 10.2 mg/dL 9.7  BUN/Creatinine Ratio 10 - 24  17  eGFR >59 mL/min/1.73 88  Alkaline Phosphatase 44 - 121 IU/L 61  Albumin 3.8 - 4.9 g/dL 4.2  AST 0 - 40 IU/L 20  ALT 0 - 44 IU/L 22  Total Protein 6.0 - 8.5 g/dL 6.2  Total Bilirubin 0.0 - 1.2 mg/dL 0.4  BILIRUBIN, DIRECT 0.00 - 0.40 mg/dL 0.15  Total CHOL/HDL Ratio 0.0 - 5.0 ratio 3.5  Cholesterol, Total 100 - 199 mg/dL 134  HDL Cholesterol >39 mg/dL 38 (L)  Triglycerides 0 - 149 mg/dL 231 (H)  VLDL Cholesterol Cal 5 - 40 mg/dL 37  LDL Chol Calc (NIH) 0 - 99 mg/dL 59    In office BG 324 mg/dL    Old records , labs and images have been reviewed.    ASSESSMENT / PLAN / RECOMMENDATIONS:   1) Type 2 Diabetes Mellitus, Optimally controlled, With neuropathic and macrovascular complications - Most recent A1c of 6.7 %. Goal A1c < 7.0 %.    -His A1c is 6.0%, we discussed  the concern regarding hypoglycemia.  He tends to take his repaglinide in the morning approximately 2 hours before eating, which would increase his risk of hypoglycemia.  His biggest meal of the day at suppertime, we have opted to discontinue the repaglinide in the morning and just continue to take it before supper, patient was advised to take the repaglinide 20-30 minutes before the supper -He has some nausea with Trulicity, but this is transient.  Used to be on Victoza and Ozempic but had to switch due to shortage, but they have noticed better clinical outcome with Trulicity rather than the Victoza and the Ozempic -No changes to metformin, Farxiga, and Trulicity  MEDICATIONS: Decrease Repaglinide 0.5 mg , 1 tablet before Supper  Continue Metformin 500 mg XR , 4 tablets daily  Continue Farxiga 10 mg daily  Continue Trulicity 4.5 mg weekly   EDUCATION / INSTRUCTIONS: BG monitoring instructions: Patient is instructed to check his blood sugars 1 times a day, fasting . Call [facility] if: BG persistently < 70  I reviewed the Rule of 15 for the treatment of hypoglycemia in detail with the patient. Literature supplied.    2) Diabetic complications:  Eye: Does not have known diabetic retinopathy.  Neuro/ Feet: Does  have known diabetic peripheral neuropathy .  Renal: Patient does not have known baseline CKD. He   is  on an ACEI/ARB at present.      F/U in 4 months     Signed electronically by: Lyndle Herrlich, MD  [facility] Endocrinology  [facility] [address] [place] [address] [place], [place] [address] Phone: [phone] FAX: [phone]   CC: Hannah Beat, MD [address] [place] [address] Phone: [phone]  Fax: [phone]  Return to Endocrinology clinic as below: Future Appointments  Date Time Provider Department Center  06/10/2022  3:00 PM Karie Kirks [facility] None  06/21/2022 12:40 PM [facility] PET CT 1  [facility] [place]  06/27/2022  7:00 AM [facility] DEVICE REMOTES [facility]  06/27/2022 10:30 AM [facility] [place] 1 CV-[facility] None  07/06/2022 10:20 AM End, Cristal Deer, MD [facility] None  07/06/2022  1:00 PM Micki Riley, MD [facility] None  07/15/2022  8:30 AM [facility] LAB [facility] PEC  07/25/2022  9:40 AM Hannah Beat, MD [facility] PEC  08/31/2022  2:00 PM Freddie Breech, DPM [facility]

## 2022-06-10 NOTE — Progress Notes (Signed)
[place] Counselor/Therapist Progress Note  Patient ID: [name] [name], MRN: [id number],    Date: 06/10/2022  Time Spent: 45 mins  Treatment Type: Individual Therapy  Reported Symptoms: Pt presents for session, via WebEx video.  Pt grants consent for the session, stating his is in his home with no one else present.  I shared with pt that I am in my office with no one else here either.   Mental Status Exam: Appearance:  Casual     Behavior: Appropriate  Motor: Normal  Speech/Language:  Clear and Coherent  Affect: Depressed and Flat  Mood: depressed  Thought process: normal  Thought content:   WNL  Sensory/Perceptual disturbances:   WNL  Orientation: oriented to person, place, and situation  Attention: Good  Concentration: Fair  Memory: Recent;   [place] of knowledge:  Good  Insight:   Good  Judgment:  Good  Impulse Control: Good   Risk Assessment: Danger to Self:  No Self-injurious Behavior: No Danger to Others: No Duty to Warn:no Physical Aggression / Violence:No  Access to Firearms a concern: No  Gang Involvement:No   Subjective: Pt shares that he "has been doing OK.  He had some medication changes by his psychiatrist and it has taken some time to settle down.  My anxiety and worry have been kind of high this week.  I am supposed to see my psychiatrist next Tuesday."  Pt shares that money is his biggest worry; he is trying to plan for their future as a family and it is hard for them to make ends meet.  Talked with pt about [facility] [place] and their credit counseling services; encouraged pt to Google them and look into their services and reach out to them to see if they can help pt and his family.  Pt shares that he went to the diabetes doctor today and she also listened to his lungs because he has had some congestion in the past couple of weeks.  Doctor shared with him today that she is pleased with his progress.  His daughters [name] (lives  with them), [name]-married, and [name]) are all doing well per pt.  Pt had stroke 02/14/20; "the first set of strokes should have killed me but they didn't.  My doctors tell me I am a miracle because I am still here."  He had a second set of strokes (07/2020) that effected his vision and his ability to walk (uses a cane).  His vision is effected on the right side of what he is looking at.  He also has to focus very hard to remember things now.  He can no longer read because of his vision.  Pt shares that [name] has been sick with a cold but she is improving.  Asked pt to shares some of his rock and crystal collections with me at our follow up session next week.  Also asked pt to track the topics of worry that he has between now our our session next week.  Interventions: Cognitive Behavioral Therapy  Diagnosis:Moderate episode of recurrent major depressive disorder ([place])  Generalized anxiety disorder  Plan: Treatment Plan Strengths/Abilities:  Intelligent, Intuitive, Willing to participate in therapy Treatment Preferences:  Outpatient Individual Therapy Statement of Needs:  Patient is to use CBT, mindfulness and coping skills to help manage and/or decrease symptoms associated with their diagnosis. Symptoms:  Depressed/Irritable mood, worry, social withdrawal Problems Addressed:  Depressive thoughts, Sadness, Sleep issues, etc. Long Term Goals:  Pt to  reduce overall level, frequency, and intensity of the feelings of depression/anxiety as evidenced by decreased irritability, negative self talk, and helpless feelings from 6 to 7 days/week to 0 to 1 days/week, per client report, for at least 3 consecutive months.  Progress: 20% Short Term Goals:  Pt to verbally express understanding of the relationship between feelings of depression/anxiety and their impact on thinking patterns and behaviors.  Pt to verbalize an understanding of the role that distorted thinking plays in creating fears, excessive worry, and  ruminations.  Progress: 20% Target Date:  06/03/2023 Frequency:  Bi-weekly Modality:  Cognitive Behavioral Therapy Interventions by Therapist:  Therapist will use CBT, Mindfulness exercises, Coping skills and Referrals, as needed by client. Client has verbally approved this treatment plan.  [name], [facility]

## 2022-06-10 NOTE — Patient Instructions (Signed)
-  Continue Repaglinide 0.5 mg , 1 tablet before Supper  - Continue Metformin 500 mg XR , 4 tablets daily  - Continue Farxiga 10 mg daily  - Continue Trulicity 4.5 mg weekly    HOW TO TREAT LOW BLOOD SUGARS (Blood sugar LESS THAN 70 MG/DL) Please follow the RULE OF 15 for the treatment of hypoglycemia treatment (when your (blood sugars are less than 70 mg/dL)   STEP 1: Take 15 grams of carbohydrates when your blood sugar is low, which includes:  3-4 GLUCOSE TABS  OR 3-4 OZ OF JUICE OR REGULAR SODA OR ONE TUBE OF GLUCOSE GEL    STEP 2: RECHECK blood sugar in 15 MINUTES STEP 3: If your blood sugar is still low at the 15 minute recheck --> then, go back to STEP 1 and treat AGAIN with another 15 grams of carbohydrates.

## 2022-06-11 LAB — MICROALBUMIN / CREATININE URINE RATIO
Creatinine, Urine: 65.5 mg/dL
Microalb/Creat Ratio: 25 mg/g creat (ref 0–29)
Microalbumin, Urine: 16.5 ug/mL

## 2022-06-12 ENCOUNTER — Other Ambulatory Visit: Payer: Self-pay | Admitting: Family Medicine

## 2022-06-12 NOTE — Telephone Encounter (Signed)
Last office visit 04/06/22 for MDD.  Last 12/04/21 for #60 with 3 refills.  CPE scheduled 07/25/22.

## 2022-06-16 ENCOUNTER — Ambulatory Visit (INDEPENDENT_AMBULATORY_CARE_PROVIDER_SITE_OTHER): Payer: Commercial Managed Care - HMO | Admitting: Psychology

## 2022-06-16 DIAGNOSIS — F411 Generalized anxiety disorder: Secondary | ICD-10-CM

## 2022-06-16 DIAGNOSIS — F331 Major depressive disorder, recurrent, moderate: Secondary | ICD-10-CM | POA: Diagnosis not present

## 2022-06-16 NOTE — Progress Notes (Signed)
[place] Counselor/Therapist Progress Note  Patient ID: [name] [name], MRN: [id number],    Date: 06/16/2022  Time Spent: 60 mins  Treatment Type: Individual Therapy  Reported Symptoms: Pt presents for session, via WebEx video.  Pt grants consent for the session, stating his is in his home with no one else present.  I shared with pt that I am in my office with no one else here either.   Mental Status Exam: Appearance:  Casual     Behavior: Appropriate  Motor: Normal  Speech/Language:  Clear and Coherent  Affect: Depressed and Flat  Mood: depressed  Thought process: normal  Thought content:   WNL  Sensory/Perceptual disturbances:   WNL  Orientation: oriented to person, place, and situation  Attention: Good  Concentration: Fair  Memory: Recent;   [place] of knowledge:  Good  Insight:   Good  Judgment:  Good  Impulse Control: Good   Risk Assessment: Danger to Self:  No Self-injurious Behavior: No Danger to Others: No Duty to Warn:no Physical Aggression / Violence:No  Access to Firearms a concern: No  Gang Involvement:No   Subjective: Pt shares that he "has been kind of been a hot mess since last week because I don't think the medicine is in my system yet."  Pt shares that his psychiatrist wants to refer him to [facility] to take up his care; [name] has already contacted the office for an appt.  Pt shares he has not felt like himself for some time.  Pt shares he is having "anxious episodes every hour" and those are not fun for him; "they stress me out really bad."  Pt shares that [name] helps him manage these episodes and gave him a Klonopin yesterday.  Talked with pt about the possibility of working on a puzzle and he shares that he likes the puzzles.  Suggested pt get a puzzle to work on as a self care activity.  Also talked with pt about coloring as another option as well.  He shares he also likes to go to [employer] in [place] and he goes about every 6  wks because he gets his haircut in the same shopping center.  Pt shares some of his rock and crystal collection in his office today and it is quiet impressive.  Pt shares that he went to [facility] for one year in 1980 "but partied too hard to stay.  I then went to work at [employer] for a while.  He then went to work with his dad and uncle in [place].  His daughters [name] (lives with them), [name]-married, and [name]) are all doing well per pt.  [name] just got a new job and had to go to [place] for training; she is happy about the new opportunity; she has a Scientist, water quality in [employer] and will be working for a Astronomer.  [name] is still struggling with her congestion but pt is better.  We will plan to talk more at our next session about pt's worries and anxieties in a effort to help reduce them.  We will meet next week for a follow up session  Interventions: Cognitive Behavioral Therapy  Diagnosis:Moderate episode of recurrent major depressive disorder ([place])  Generalized anxiety disorder  Plan: Treatment Plan Strengths/Abilities:  Intelligent, Intuitive, Willing to participate in therapy Treatment Preferences:  Outpatient Individual Therapy Statement of Needs:  Patient is to use CBT, mindfulness and coping skills to help manage and/or decrease symptoms associated with their diagnosis.  Symptoms:  Depressed/Irritable mood, worry, social withdrawal Problems Addressed:  Depressive thoughts, Sadness, Sleep issues, etc. Long Term Goals:  Pt to reduce overall level, frequency, and intensity of the feelings of depression/anxiety as evidenced by decreased irritability, negative self talk, and helpless feelings from 6 to 7 days/week to 0 to 1 days/week, per client report, for at least 3 consecutive months.  Progress: 20% Short Term Goals:  Pt to verbally express understanding of the relationship between feelings of depression/anxiety and their impact on thinking patterns and behaviors.  Pt to  verbalize an understanding of the role that distorted thinking plays in creating fears, excessive worry, and ruminations.  Progress: 20% Target Date:  06/03/2023 Frequency:  Bi-weekly Modality:  Cognitive Behavioral Therapy Interventions by Therapist:  Therapist will use CBT, Mindfulness exercises, Coping skills and Referrals, as needed by client. Client has verbally approved this treatment plan.  [name], [facility]

## 2022-06-17 ENCOUNTER — Telehealth: Payer: Self-pay | Admitting: Internal Medicine

## 2022-06-17 ENCOUNTER — Telehealth (HOSPITAL_COMMUNITY): Payer: Self-pay | Admitting: Emergency Medicine

## 2022-06-17 NOTE — Telephone Encounter (Signed)
Attempted to call patient regarding upcoming cardiac PET appointment. Left message on voicemail with name and callback number Donley Harland RN Navigator Cardiac Imaging [facility] [phone] Office [phone] Cell  

## 2022-06-17 NOTE — Telephone Encounter (Signed)
Patient wife called to say that she received notification that his insurance will not pay for the two test (his CT of his heart and [place] of his legs) Dr. Saunders Revel has ordered for him. She stated at this time patient has Pharmacist, community, she stated that for one test is would cost over $3,000, he can't not afford that.  She said in March he will be switching to [employer], she wants to know if the test can waiting until then to be done. Please advise.

## 2022-06-18 NOTE — Telephone Encounter (Signed)
If his symptoms are stable, it is reasonable to defer his testing until he switches insurance in March.  It would be worthwhile inquiring with the patient if the tests were denied by his current insurance or if he has a high deductible or out-of-pocket cost that needs to be met first.  If the tests are being denied by his insurance, we could do a peer-to-peer discussion to seek approval.  Otherwise, he will need to decide if he wishes to pay the high out-of-pocket cost first or wait until his Medicare kicks in.  Nelva Bush, MD [facility]

## 2022-06-20 ENCOUNTER — Telehealth: Payer: Self-pay | Admitting: Internal Medicine

## 2022-06-20 NOTE — Telephone Encounter (Signed)
Pt's wife made aware of MD's recommendations and stated it's a result of high deductible. Wife stated they would prefer to reschedule in March.  Will forward message to scheduling.

## 2022-06-20 NOTE — Telephone Encounter (Signed)
Left message to call back  

## 2022-06-20 NOTE — Telephone Encounter (Signed)
  Pt's wife calling, she said, pt is switching insurance to medicare and wanted to ask if its ok to delay his LE arterial test after 07/29/22. She would like to get Dr. Darnelle Bos recommendation

## 2022-06-20 NOTE — Telephone Encounter (Signed)
Patient's wife returning call. 

## 2022-06-20 NOTE — Progress Notes (Signed)
Carelink Summary Report / Loop Recorder

## 2022-06-21 ENCOUNTER — Encounter (HOSPITAL_COMMUNITY): Admission: RE | Admit: 2022-06-21 | Payer: Commercial Managed Care - HMO | Source: Ambulatory Visit

## 2022-06-21 ENCOUNTER — Telehealth: Payer: Self-pay | Admitting: Internal Medicine

## 2022-06-21 NOTE — Telephone Encounter (Signed)
LVM to reschedule

## 2022-06-21 NOTE — Telephone Encounter (Signed)
-----  Message from Meryl Crutch, RN sent at 06/20/2022  5:16 PM EST ----- Please reschedule pt's [facility] extremity doppler scheduled for 1/29 to March.   Thanks

## 2022-06-23 ENCOUNTER — Ambulatory Visit (INDEPENDENT_AMBULATORY_CARE_PROVIDER_SITE_OTHER): Payer: Commercial Managed Care - HMO | Admitting: Psychology

## 2022-06-23 DIAGNOSIS — F331 Major depressive disorder, recurrent, moderate: Secondary | ICD-10-CM | POA: Diagnosis not present

## 2022-06-23 DIAGNOSIS — F411 Generalized anxiety disorder: Secondary | ICD-10-CM | POA: Diagnosis not present

## 2022-06-23 NOTE — Telephone Encounter (Signed)
Yes, it should be fine to wait to March.

## 2022-06-23 NOTE — Progress Notes (Addendum)
[facility] Counselor/Therapist Progress Note  Patient ID: [name] [name], MRN: [id number],    Date: 06/23/2022  Time Spent: 45 mins  Treatment Type: Individual Therapy  Reported Symptoms: Pt presents for session, via WebEx video.  Pt grants consent for the session, stating his is in his home with no one else present.  I shared with pt that I am in my office with no one else here either.   Mental Status Exam: Appearance:  Casual     Behavior: Appropriate  Motor: Normal  Speech/Language:  Clear and Coherent  Affect: Depressed and Flat  Mood: depressed  Thought process: normal  Thought content:   WNL  Sensory/Perceptual disturbances:   WNL  Orientation: oriented to person, place, and situation  Attention: Good  Concentration: Fair  Memory: Recent;   [place] of knowledge:  Good  Insight:   Good  Judgment:  Good  Impulse Control: Good   Risk Assessment: Danger to Self:  No Self-injurious Behavior: No Danger to Others: No Duty to Warn:no Physical Aggression / Violence:No  Access to Firearms a concern: No  Gang Involvement:No   Subjective: Pt shares that he "has been back and forth since last week.  I think the increase in dose of my medicine has caused me some nausea and one day last week I could hardly get out of bed.  [name] [name] has set me up an appt with your brother's practice on 4/2."  Pt reminds me that Dr. Nicolasa Ducking wanted him to transfer his care of [facility]; he sees her one more time in late Feb.  Pt shares that his mood has been "OK but the anxiety still kicks in and I have to re-direct myself from time to time."  He shares that his anxious episodes come to him at all times of the day.  He tends to worry about things that he needs to take care of.  Reminded pt of using deep breathing to help with his anxiety and pt mentions that he does a pretty good job of re-directing his thoughts.  Pt shares that he and [name] [name] took lunch to a former neighbor who has had some  medical issues and pt enjoyed that time visiting and pt could tell that they enjoyed the visit.  Pt shares that he has not taken any Klonopin since last week's session.  Talked again with pt about the possibility of working on a puzzle and he shares that he likes the puzzles.  Suggested pt get a puzzle to work on as a self care activity.  Also talked with pt about coloring as another option as well.  Pt shares he will pursue both activities as soon as he is able.  His daughters [name] [name] (lives with them), [name] [name]-married, and [name] [name]) are all doing well per pt.  [name] [name] just got a new job and had to go to [place] for training; she is happy about the new opportunity; she has a Scientist, water quality in [place] and will be working for a Astron[employer].  Pt and [name] [name] are going to [place] to see family in a couple of weeks.  Encouraged pt to continue with his self care activities and we will meet next week for a follow up session.  Interventions: Cognitive Behavioral Therapy  Diagnosis:Moderate episode of recurrent major depressive disorder ([place])  Generalized anxiety disorder  Plan: Treatment Plan Strengths/Abilities:  Intelligent, Intuitive, Willing to participate in therapy Treatment Preferences:  Outpatient Individual Therapy Statement of Needs:  Patient is to use CBT, mindfulness  and coping skills to help manage and/or decrease symptoms associated with their diagnosis. Symptoms:  Depressed/Irritable mood, worry, social withdrawal Problems Addressed:  Depressive thoughts, Sadness, Sleep issues, etc. Long Term Goals:  Pt to reduce overall level, frequency, and intensity of the feelings of depression/anxiety as evidenced by decreased irritability, negative self talk, and helpless feelings from 6 to 7 days/week to 0 to 1 days/week, per client report, for at least 3 consecutive months.  Progress: 20% Short Term Goals:  Pt to verbally express understanding of the relationship between feelings of depression/anxiety and their  impact on thinking patterns and behaviors.  Pt to verbalize an understanding of the role that distorted thinking plays in creating fears, excessive worry, and ruminations.  Progress: 20% Target Date:  06/03/2023 Frequency:  Bi-weekly Modality:  Cognitive Behavioral Therapy Interventions by Therapist:  Therapist will use CBT, Mindfulness exercises, Coping skills and Referrals, as needed by client. Client has verbally approved this treatment plan.  [name], [facility]

## 2022-06-24 NOTE — Telephone Encounter (Signed)
Left message to call back  

## 2022-06-26 IMAGING — CT CT HEAD W/O CM
4 series · 15 of 47 positions shown, 17 images · non-contrast
Comparison: CT angio head and neck and CT perfusion 02/14/2020

CLINICAL DATA: Stroke follow-up

EXAM:
CT HEAD WITHOUT CONTRAST
TECHNIQUE: Contiguous axial images were obtained from the base of the skull
through the vertex without intravenous contrast.

[Series 3: head bone · axial · 0.46mm/px · z∈[-116,-100]mm · 2 of 76 slices shown]
[im 8/76  bone]
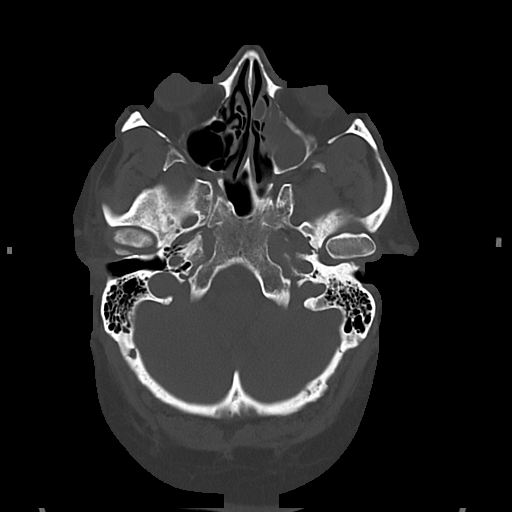
[im 16/76  bone]
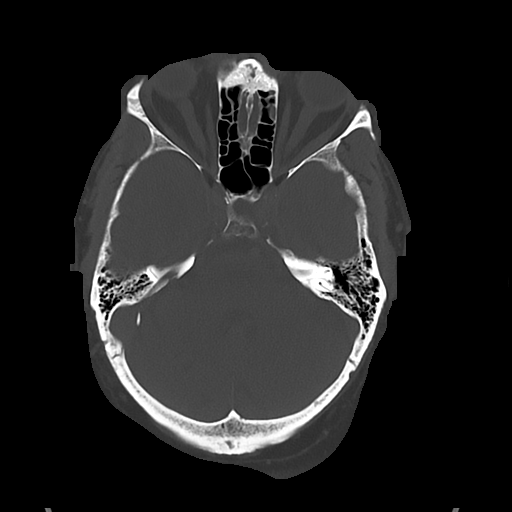

[Series 4: head wo · axial · 0.46mm/px · z∈[-115,+0]mm · 7 of 31 slices shown, 9 images]
[im 4/31  brain]
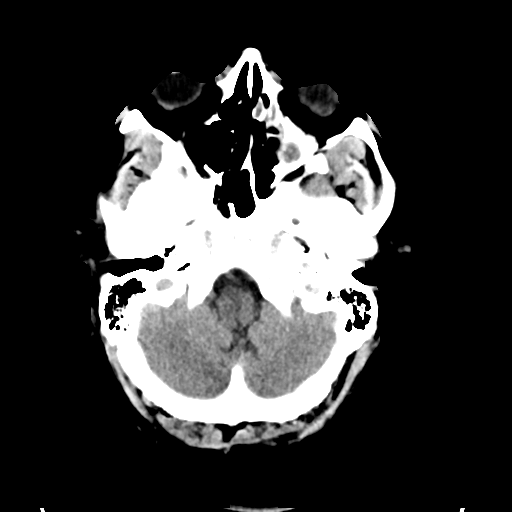
[im 4/31  bone]
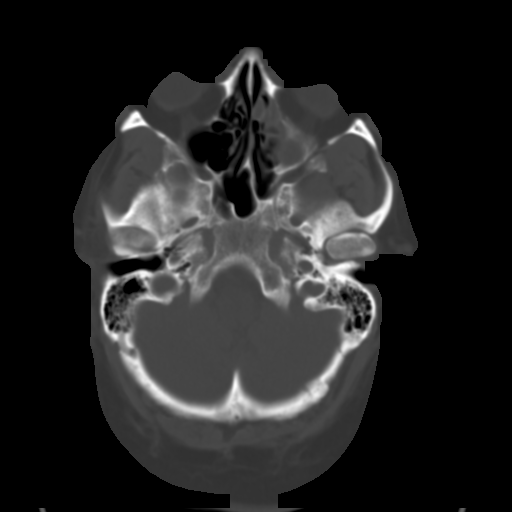
[im 8/31  brain]
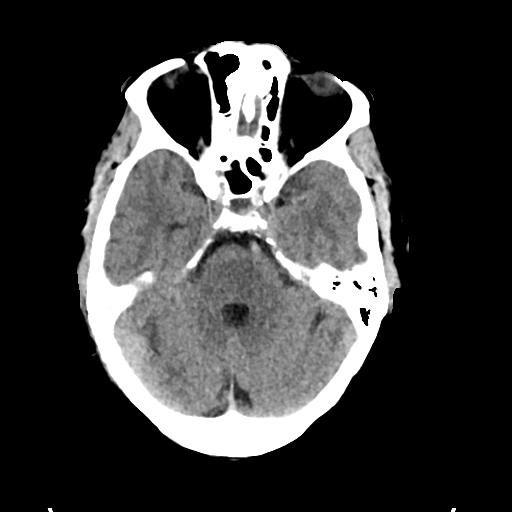
[im 12/31  brain]
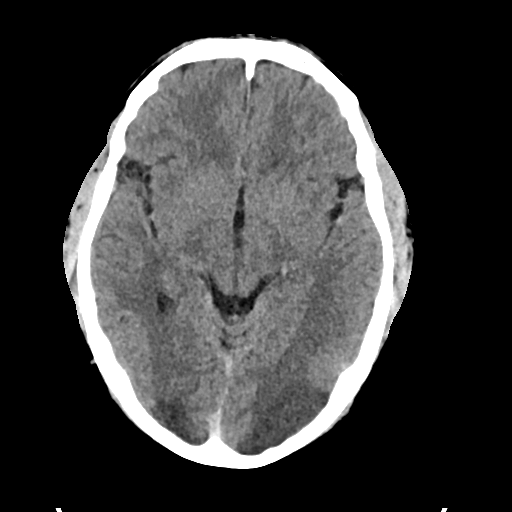
[im 16/31  brain]
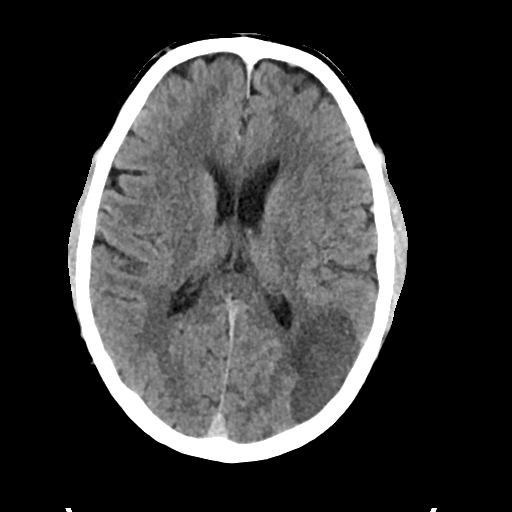
[im 19/31  brain]
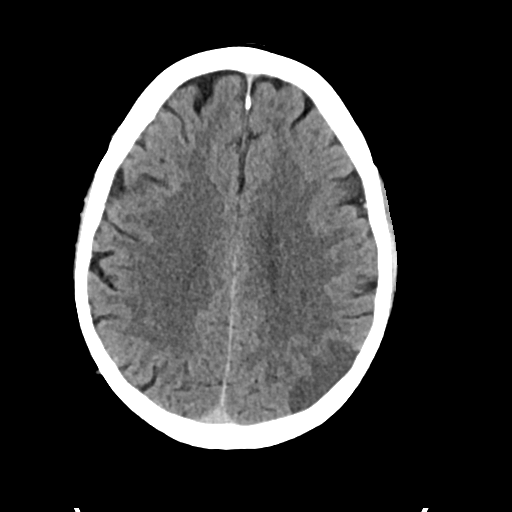
[im 19/31  bone]
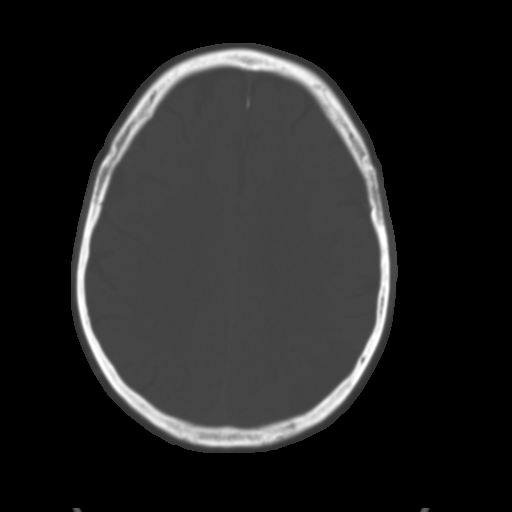
[im 23/31  brain]
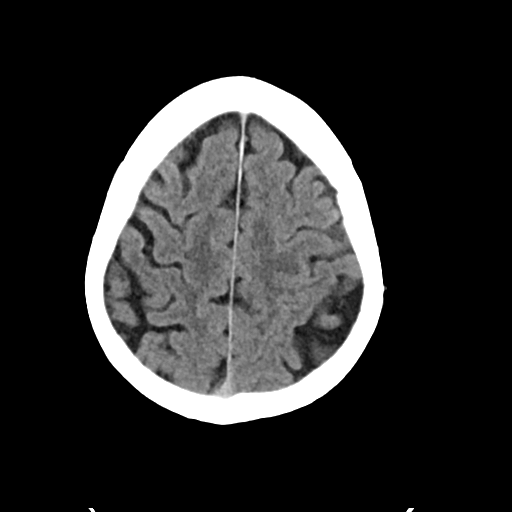
[im 27/31  brain]
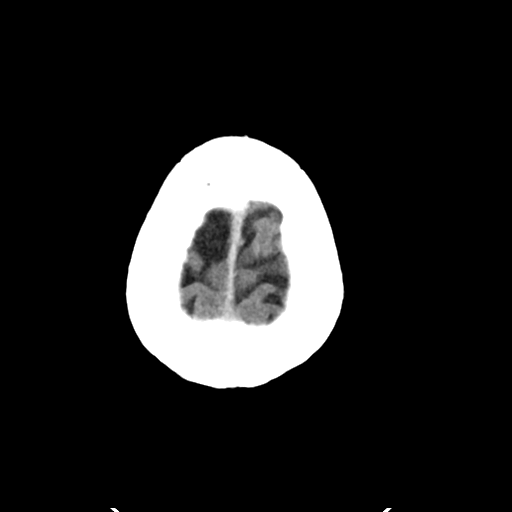

[Series 5: cor soft · coronal · 0.33mm/px · 3 of 70 slices shown]
[im 24/70  brain]
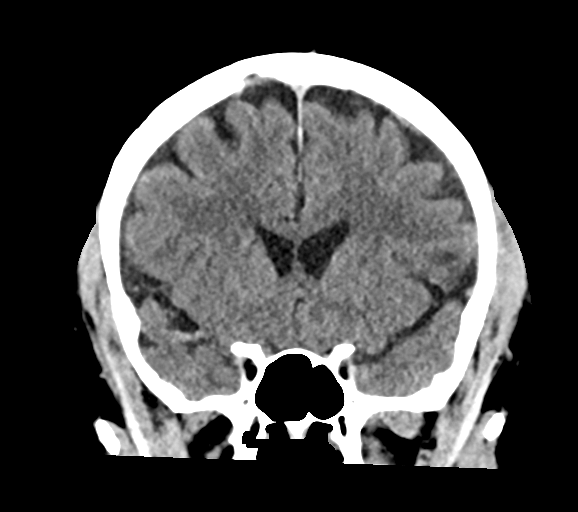
[im 31/70  brain]
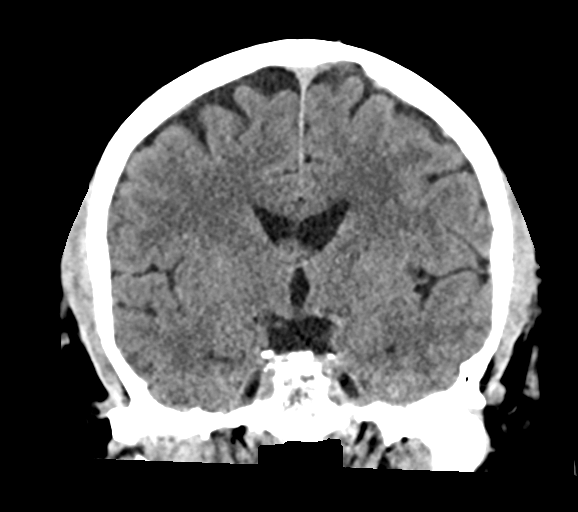
[im 39/70  brain]
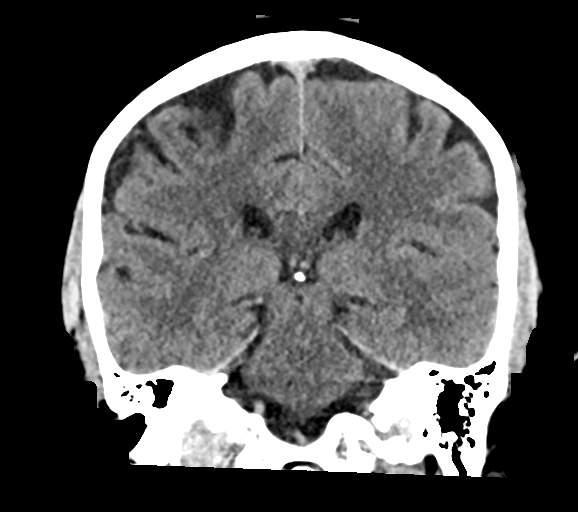

[Series 6: sag soft · sagittal · 0.33mm/px · 3 of 57 slices shown]
[im 19/57  brain]
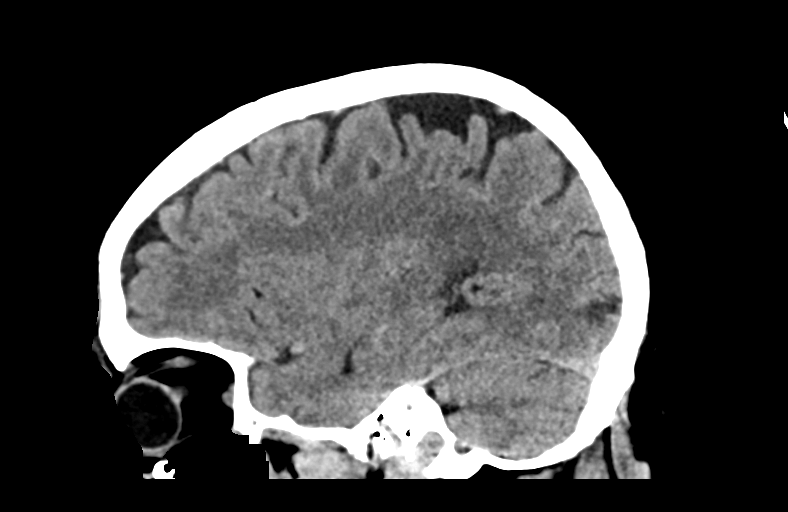
[im 29/57  brain]
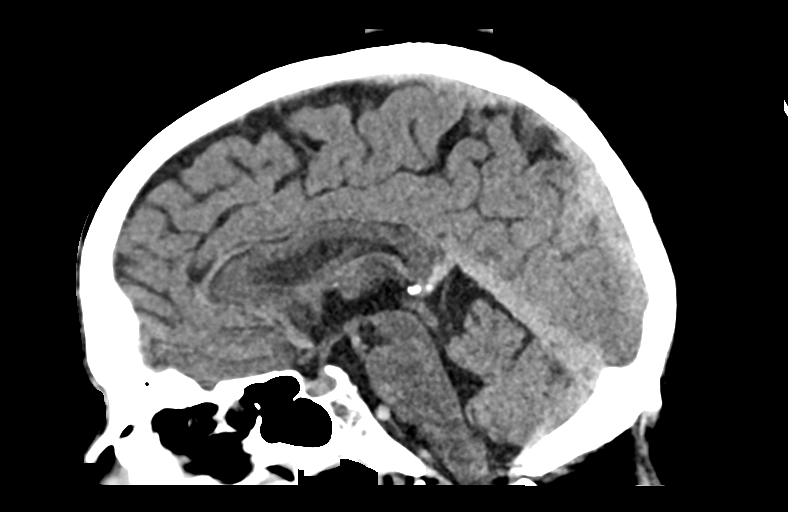
[im 38/57  brain]
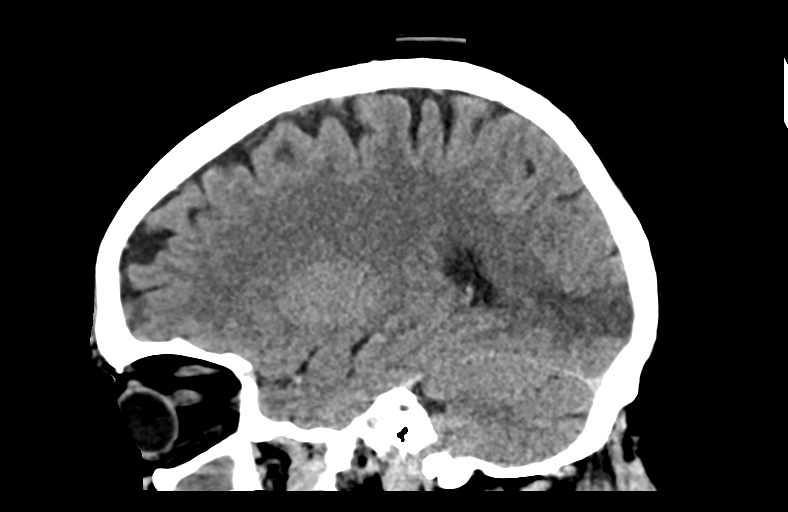

[15 of 47 positions shown; findings below may reference images not displayed]

FINDINGS: Brain: Developing hypodensity in the left occipital parietal lobe
compatible with acute infarct. This corresponds to the CT perfusion
abnormality. No hemorrhagic transformation

Small chronic infarcts in the right cerebellum. Small chronic
infarct right occipital lobe. Ventricle size normal. Negative for
hemorrhage or mass.

Vascular: Negative for focal hyperdense vessel. The circle Willis is
diffusely hyperdense.

Skull: Negative

Sinuses/Orbits: Complete opacification left maxillary sinus
unchanged. Mild mucosal edema ethmoid sinus bilaterally. Negative
orbit.

Other: None
IMPRESSION: Acute infarct in the left occipital parietal lobe with progressive
low-density since the prior studies. Negative for hemorrhagic
transformation.

## 2022-06-27 ENCOUNTER — Ambulatory Visit: Payer: Commercial Managed Care - HMO

## 2022-06-27 DIAGNOSIS — I639 Cerebral infarction, unspecified: Secondary | ICD-10-CM

## 2022-06-29 ENCOUNTER — Ambulatory Visit (INDEPENDENT_AMBULATORY_CARE_PROVIDER_SITE_OTHER): Payer: Commercial Managed Care - HMO | Admitting: Psychology

## 2022-06-29 DIAGNOSIS — F331 Major depressive disorder, recurrent, moderate: Secondary | ICD-10-CM

## 2022-06-29 DIAGNOSIS — F411 Generalized anxiety disorder: Secondary | ICD-10-CM

## 2022-06-29 LAB — CUP PACEART REMOTE DEVICE CHECK
Date Time Interrogation Session: 20240127230858
Implantable Pulse Generator Implant Date: 20211008

## 2022-06-29 NOTE — Progress Notes (Signed)
[name] [name] Counselor/Therapist Progress Note  Patient ID: [name] [name], MRN: [id number],    Date: 06/29/2022  Time Spent: 45 mins  Treatment Type: Individual Therapy  Reported Symptoms: Pt presents for session, via WebEx video.  Pt grants consent for the session, stating his is in his home with no one else present.  I shared with pt that I am in my office with no one else here either.   Mental Status Exam: Appearance:  Casual     Behavior: Appropriate  Motor: Normal  Speech/Language:  Clear and Coherent  Affect: Depressed and Flat  Mood: depressed  Thought process: normal  Thought content:   WNL  Sensory/Perceptual disturbances:   WNL  Orientation: oriented to person, place, and situation  Attention: Good  Concentration: Fair  Memory: Recent;   [name] of knowledge:  Good  Insight:   Good  Judgment:  Good  Impulse Control: Good   Risk Assessment: Danger to Self:  No Self-injurious Behavior: No Danger to Others: No Duty to Warn:no Physical Aggression / Violence:No  Access to Firearms a concern: No  Gang Involvement:No   Subjective: Pt shares that he would like for me to talk with [name] [name], his wife, about what she sees as what we might need to work on for [name] [name]'s benefit.  [name] [name] shares that she is able to see improvements in [name] [name]'s mood and his ability to handle his anxious episodes.  She is very supportive of him continuing to work with me and with Dr. Nicolasa Ducking.  [name] [name] shares that [name] [name] will also share some of what we talk about in our sessions with her so that he can continue to work on them during the week between our sessions.  She is supportive of [name] [name] working on his rock/crystal collections.  She is also looking into creating a couple of raised beds in their yard to allow [name] [name] the opportunity to grow some vegetables and flowers because he enjoys gardening and enjoys being outside.  Pt shares that he "has been sleeping like a rock" of late and he feels rested most  mornings.  Pt has an appt for medication management at [facility] on 4/2.  Pt shares that he has continued to have anxious episodes but they are a bit less frequent in the past week; pt introduced me to [name] [name], their rescue Yorkie.  [name] [name] serves as a form of distraction for pt when pt has anxious episodes and it is clear that [name] [name] loves pt.  Pt shares that he continues to watch TV shows that he enjoys; [name] [name] is going to look for puzzles and coloring books to help pt maintain his mood.  Pt shares that he is also cooking more for the family and he enjoys that; he cooked a whole meal for the family last night.  Pt shares that he has not taken any Klonopin since last week's session.  His daughters [name] [name] (lives with them), [name] [name]-married, and [name] [name]) are all doing well per pt.   Pt and [name] [name] are going to [place] to see family in a couple of weeks.  Encouraged pt to continue with his self care activities and we will meet next week for a follow up session.  Interventions: Cognitive Behavioral Therapy  Diagnosis:Moderate episode of recurrent major depressive disorder ([place])  Generalized anxiety disorder  Plan: Treatment Plan Strengths/Abilities:  Intelligent, Intuitive, Willing to participate in therapy Treatment Preferences:  Outpatient Individual Therapy Statement of Needs:  Patient is to use CBT, mindfulness and coping skills to help  manage and/or decrease symptoms associated with their diagnosis. Symptoms:  Depressed/Irritable mood, worry, social withdrawal Problems Addressed:  Depressive thoughts, Sadness, Sleep issues, etc. Long Term Goals:  Pt to reduce overall level, frequency, and intensity of the feelings of depression/anxiety as evidenced by decreased irritability, negative self talk, and helpless feelings from 6 to 7 days/week to 0 to 1 days/week, per client report, for at least 3 consecutive months.  Progress: 20% Short Term Goals:  Pt to verbally express understanding of the relationship between  feelings of depression/anxiety and their impact on thinking patterns and behaviors.  Pt to verbalize an understanding of the role that distorted thinking plays in creating fears, excessive worry, and ruminations.  Progress: 20% Target Date:  06/03/2023 Frequency:  Bi-weekly Modality:  Cognitive Behavioral Therapy Interventions by Therapist:  Therapist will use CBT, Mindfulness exercises, Coping skills and Referrals, as needed by client. Client has verbally approved this treatment plan.  [name], [facility]

## 2022-07-01 NOTE — Telephone Encounter (Signed)
Pt's wife made aware ok to wait until March for LEA. Pt verbalized understanding.

## 2022-07-06 ENCOUNTER — Ambulatory Visit: Payer: Commercial Managed Care - HMO | Admitting: Internal Medicine

## 2022-07-06 ENCOUNTER — Ambulatory Visit: Payer: 59 | Admitting: Neurology

## 2022-07-07 ENCOUNTER — Encounter (HOSPITAL_COMMUNITY): Payer: Self-pay | Admitting: *Deleted

## 2022-07-10 ENCOUNTER — Other Ambulatory Visit: Payer: Self-pay | Admitting: Family Medicine

## 2022-07-15 ENCOUNTER — Other Ambulatory Visit: Payer: Self-pay | Admitting: Family Medicine

## 2022-07-15 ENCOUNTER — Other Ambulatory Visit: Payer: 59

## 2022-07-15 DIAGNOSIS — E559 Vitamin D deficiency, unspecified: Secondary | ICD-10-CM

## 2022-07-15 DIAGNOSIS — I480 Paroxysmal atrial fibrillation: Secondary | ICD-10-CM

## 2022-07-15 DIAGNOSIS — E1159 Type 2 diabetes mellitus with other circulatory complications: Secondary | ICD-10-CM

## 2022-07-15 DIAGNOSIS — Z125 Encounter for screening for malignant neoplasm of prostate: Secondary | ICD-10-CM

## 2022-07-15 DIAGNOSIS — E785 Hyperlipidemia, unspecified: Secondary | ICD-10-CM

## 2022-07-15 DIAGNOSIS — Z79899 Other long term (current) drug therapy: Secondary | ICD-10-CM

## 2022-07-15 DIAGNOSIS — E538 Deficiency of other specified B group vitamins: Secondary | ICD-10-CM

## 2022-07-19 ENCOUNTER — Other Ambulatory Visit: Payer: Self-pay | Admitting: Family Medicine

## 2022-07-19 ENCOUNTER — Other Ambulatory Visit (INDEPENDENT_AMBULATORY_CARE_PROVIDER_SITE_OTHER): Payer: Commercial Managed Care - HMO

## 2022-07-19 DIAGNOSIS — E538 Deficiency of other specified B group vitamins: Secondary | ICD-10-CM

## 2022-07-19 DIAGNOSIS — I480 Paroxysmal atrial fibrillation: Secondary | ICD-10-CM

## 2022-07-19 DIAGNOSIS — E1159 Type 2 diabetes mellitus with other circulatory complications: Secondary | ICD-10-CM

## 2022-07-19 DIAGNOSIS — Z125 Encounter for screening for malignant neoplasm of prostate: Secondary | ICD-10-CM

## 2022-07-19 DIAGNOSIS — E559 Vitamin D deficiency, unspecified: Secondary | ICD-10-CM

## 2022-07-19 DIAGNOSIS — Z79899 Other long term (current) drug therapy: Secondary | ICD-10-CM

## 2022-07-19 DIAGNOSIS — E785 Hyperlipidemia, unspecified: Secondary | ICD-10-CM

## 2022-07-19 NOTE — Addendum Note (Signed)
Addended by: Ellamae Sia on: 07/19/2022 10:04 AM   Modules accepted: Orders

## 2022-07-20 LAB — CBC WITH DIFFERENTIAL/PLATELET
Basophils Absolute: 0 10*3/uL (ref 0.0–0.2)
Basos: 1 %
EOS (ABSOLUTE): 0.2 10*3/uL (ref 0.0–0.4)
Eos: 2 %
Hematocrit: 46.1 % (ref 37.5–51.0)
Hemoglobin: 16.1 g/dL (ref 13.0–17.7)
Immature Grans (Abs): 0 10*3/uL (ref 0.0–0.1)
Immature Granulocytes: 0 %
Lymphocytes Absolute: 1.5 10*3/uL (ref 0.7–3.1)
Lymphs: 21 %
MCH: 29.8 pg (ref 26.6–33.0)
MCHC: 34.9 g/dL (ref 31.5–35.7)
MCV: 85 fL (ref 79–97)
Monocytes Absolute: 0.8 10*3/uL (ref 0.1–0.9)
Monocytes: 11 %
Neutrophils Absolute: 4.6 10*3/uL (ref 1.4–7.0)
Neutrophils: 65 %
Platelets: 182 10*3/uL (ref 150–450)
RBC: 5.41 x10E6/uL (ref 4.14–5.80)
RDW: 14.1 % (ref 11.6–15.4)
WBC: 7.2 10*3/uL (ref 3.4–10.8)

## 2022-07-20 LAB — HEPATIC FUNCTION PANEL
ALT: 35 IU/L (ref 0–44)
AST: 20 IU/L (ref 0–40)
Albumin: 4.2 g/dL (ref 3.9–4.9)
Alkaline Phosphatase: 46 IU/L (ref 44–121)
Bilirubin Total: 0.4 mg/dL (ref 0.0–1.2)
Bilirubin, Direct: 0.15 mg/dL (ref 0.00–0.40)
Total Protein: 5.9 g/dL — ABNORMAL LOW (ref 6.0–8.5)

## 2022-07-20 LAB — PSA, TOTAL AND FREE
PSA, Free Pct: 22.5 %
PSA, Free: 0.09 ng/mL
Prostate Specific Ag, Serum: 0.4 ng/mL (ref 0.0–4.0)

## 2022-07-20 LAB — BASIC METABOLIC PANEL
BUN/Creatinine Ratio: 20 (ref 10–24)
BUN: 20 mg/dL (ref 8–27)
CO2: 22 mmol/L (ref 20–29)
Calcium: 9.6 mg/dL (ref 8.6–10.2)
Chloride: 106 mmol/L (ref 96–106)
Creatinine, Ser: 0.98 mg/dL (ref 0.76–1.27)
Glucose: 136 mg/dL — ABNORMAL HIGH (ref 70–99)
Potassium: 4.5 mmol/L (ref 3.5–5.2)
Sodium: 144 mmol/L (ref 134–144)
eGFR: 88 mL/min/{1.73_m2} (ref >59)

## 2022-07-20 LAB — LIPID PANEL
Chol/HDL Ratio: 3.3 ratio (ref 0.0–5.0)
Cholesterol, Total: 118 mg/dL (ref 100–199)
HDL: 36 mg/dL — ABNORMAL LOW (ref >39)
LDL Chol Calc (NIH): 51 mg/dL (ref 0–99)
Triglycerides: 186 mg/dL — ABNORMAL HIGH (ref 0–149)
VLDL Cholesterol Cal: 31 mg/dL (ref 5–40)

## 2022-07-20 LAB — MICROALBUMIN / CREATININE URINE RATIO
Creatinine, Urine: 42 mg/dL
Microalb/Creat Ratio: 29 mg/g creat (ref 0–29)
Microalbumin, Urine: 12.1 ug/mL

## 2022-07-20 LAB — VITAMIN D 25 HYDROXY (VIT D DEFICIENCY, FRACTURES): Vit D, 25-Hydroxy: 37.7 ng/mL (ref 30.0–100.0)

## 2022-07-20 LAB — TSH: TSH: 0.929 u[IU]/mL (ref 0.450–4.500)

## 2022-07-20 LAB — VITAMIN B12: Vitamin B-12: 1310 pg/mL — ABNORMAL HIGH (ref 232–1245)

## 2022-07-21 ENCOUNTER — Ambulatory Visit (INDEPENDENT_AMBULATORY_CARE_PROVIDER_SITE_OTHER): Payer: Commercial Managed Care - HMO | Admitting: Psychology

## 2022-07-21 DIAGNOSIS — F411 Generalized anxiety disorder: Secondary | ICD-10-CM

## 2022-07-21 DIAGNOSIS — F331 Major depressive disorder, recurrent, moderate: Secondary | ICD-10-CM

## 2022-07-21 NOTE — Progress Notes (Signed)
[facility] Counselor/Therapist Progress Note  Patient ID: [name] [name], MRN: [id number],    Date: 07/21/2022  Time Spent: 45 mins  Treatment Type: Individual Therapy  Reported Symptoms: Pt presents for session, via WebEx video.  Pt grants consent for the session, stating his is in his home with no one else present.  I shared with pt that I am in my office with no one else here either.   Mental Status Exam: Appearance:  Casual     Behavior: Appropriate  Motor: Normal  Speech/Language:  Clear and Coherent  Affect: Depressed and Flat  Mood: depressed  Thought process: normal  Thought content:   WNL  Sensory/Perceptual disturbances:   WNL  Orientation: oriented to person, place, and situation  Attention: Good  Concentration: Fair  Memory: Recent;   [place] of knowledge:  Good  Insight:   Good  Judgment:  Good  Impulse Control: Good   Risk Assessment: Danger to Self:  No Self-injurious Behavior: No Danger to Others: No Duty to Warn:no Physical Aggression / Violence:No  Access to Firearms a concern: No  Gang Involvement:No   Subjective: Pt shares that he "has been fine since our last session.  We went on our vacation to [place] and it was good seeing and spending time with my nephews.  It was nice but I was glad to get home too.  We only ate seafood one time and I love seafood.  It just felt kind of strange with my brother-in-law; he started bugging me; he is really self-absorbed."  Pt also shares that "my kids have been really aggravating.  [name] kicked her no-good boyfriend out for a couple of days and then took him back.  We were happy when she kicked him out but were disappointed that she took him back."  Taylor's boyfriend is on probation but is a good guy but he had an episode this past week where he got really paranoid and he was promising to get help.  "He has had some awful things happen to him in his life and they have never been addressed.  [name] has a  mental condition that has been diagnosed and I being treated."  Pt shares he got some coloring books and colored pencils and also got several puzzles.  "[name] and [name] got me several puzzles too."  [name] fell in their garage before their trip and hurt her head and hip and the drive was uncomfortable for her.  She is seeing a chiropractor today to start feeling better.  Pt shares that he did experience a few anxious episodes while on the trip but he feels like he managed them pretty well.  Pt has a follow up visit with his PCP and with Dr. Nicolasa Ducking both next week; he had his blood work for his annual physical and the results were good.  Pt shares he has been sleeping well but he has been having more dreams since returning from vacation.  Pt has an appt for medication management at [facility] on 4/2.  Pt shares that he has not taken any Klonopin since last week's session.  Encouraged pt to continue with his self care activities and we will meet next week for a follow up session.  Interventions: Cognitive Behavioral Therapy  Diagnosis:Moderate episode of recurrent major depressive disorder ([place])  Generalized anxiety disorder  Plan: Treatment Plan Strengths/Abilities:  Intelligent, Intuitive, Willing to participate in therapy Treatment Preferences:  Outpatient Individual Therapy Statement of Needs:  Patient is to use  CBT, mindfulness and coping skills to help manage and/or decrease symptoms associated with their diagnosis. Symptoms:  Depressed/Irritable mood, worry, social withdrawal Problems Addressed:  Depressive thoughts, Sadness, Sleep issues, etc. Long Term Goals:  Pt to reduce overall level, frequency, and intensity of the feelings of depression/anxiety as evidenced by decreased irritability, negative self talk, and helpless feelings from 6 to 7 days/week to 0 to 1 days/week, per client report, for at least 3 consecutive months.  Progress: 20% Short Term Goals:  Pt to verbally express understanding of  the relationship between feelings of depression/anxiety and their impact on thinking patterns and behaviors.  Pt to verbalize an understanding of the role that distorted thinking plays in creating fears, excessive worry, and ruminations.  Progress: 20% Target Date:  06/03/2023 Frequency:  Bi-weekly Modality:  Cognitive Behavioral Therapy Interventions by Therapist:  Therapist will use CBT, Mindfulness exercises, Coping skills and Referrals, as needed by client. Client has verbally approved this treatment plan.  [name], [facility]

## 2022-07-24 ENCOUNTER — Other Ambulatory Visit: Payer: Self-pay

## 2022-07-24 ENCOUNTER — Encounter: Payer: Self-pay | Admitting: Internal Medicine

## 2022-07-24 NOTE — Progress Notes (Unsigned)
[name] [name] T. [name] Guedes, MD, [place] at [facility] [place], [address]  Phone: [phone]  FAX: [phone]  [name] [name] - 62 y.o. male  MRN [id number]  Date of Birth: 23-Feb-1961  Date: 07/25/2022  PCP: [name] Loffler, MD  Referral: [name] Loffler, MD  No chief complaint on file.  Patient Care Team: [name] Loffler, MD as PCP - General End, Harrell Gave, MD as PCP - Cardiology (Cardiology) Druscilla Brownie, MD as Consulting Physician (Dermatology) Johnathan Hausen, MD as Consulting Physician (General Surgery) Subjective:   [name] [name] is a 62 y.o. pleasant patient who presents for a medicare wellness examination:  Preventative Health Maintenance Visit:  Health Maintenance Summary Reviewed and updated, unless pt declines services.  Tobacco History Reviewed. Alcohol: No concerns, no excessive use Exercise Habits: Some activity, rec at least 30 mins 5 times a week STD concerns: no risk or activity to increase risk Drug Use: None  He is a very complicated medical patient, and he presents today for Medicare wellness exam and also follow-up on multiple medical problems.  He has a history of multiple strokes.  Morbid obesity.  Severe diabetic polyneuropathy, hypertension, severe depression, severe anxiety, hyperlipidemia.  He also has historically been a smoker.  He does see endocrinology now for his diabetes management. For his severe depression he has been seeing Dr. Nicolasa Ducking and he has also been working with a Catering manager.    Health Maintenance  Topic Date Due   COVID-19 Vaccine (4 - 2023-24 season) 01/28/2022   OPHTHALMOLOGY EXAM  03/09/2022   Zoster Vaccines- Shingrix (2 of 2) 06/01/2022   HEMOGLOBIN A1C  12/09/2022   FOOT EXAM  03/19/2023   Diabetic kidney evaluation - eGFR measurement  07/20/2023   Diabetic kidney evaluation - Urine ACR  07/20/2023   DTaP/Tdap/Td (2 - Td or  Tdap) 04/20/2026   COLONOSCOPY (Pts 45-76yr Insurance coverage will need to be confirmed)  09/07/2026   INFLUENZA VACCINE  Completed   Hepatitis C Screening  Completed   HIV Screening  Completed   HPV VACCINES  Aged Out    Immunization History  Administered Date(s) Administered   Influenza,inj,Quad PF,6+ Mos 04/20/2016, 03/23/2017, 03/19/2018, 02/06/2019, 02/20/2020, 01/19/2022   Influenza-Unspecified 03/31/2015, 04/01/2021   Janssen (J&J) SARS-COV-2 Vaccination 09/27/2019, 04/17/2020   Moderna Covid-19 Vaccine Bivalent Booster 151yr& up 04/01/2021   Pneumococcal Polysaccharide-23 11/25/2010, 04/20/2016   Tdap 04/20/2016   Zoster Recombinat (Shingrix) 04/06/2022    Patient Active Problem List   Diagnosis Date Noted   [facility] ([facility]     Priority: High   Type 2 diabetes mellitus with vascular disease (HCL[facility]    Priority: High   Morbid obesity (HCSilverthorne05/06/2006    Priority: High   PAF (paroxysmal atrial fibrillation) (HCM[place]04/08/2020    Priority: Medium    MDD (major depressive disorder), recurrent episode (HCPawtuck[place]    Priority: Medium    Diabetic polyneuropathy associated with type 2 diabetes mellitus (HCUpsala02/05/2018    Priority: Medium    Circulation disorder of lower extremity 05/31/2015    Priority: Medium    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 (HCMercer11/25/2015    Priority: Medium    Generalized anxiety disorder     Priority: Medium    Hyperlipidemia LDL goal <70 01/16/2009    Priority: Medium    TOBACCO ABUSE 07/27/2007    Priority: Medium    Essential hypertension 09/29/2006    Priority:  Medium    Precordial pain 05/07/2022   Claudication in peripheral vascular disease ([place]) 05/07/2022   Type 2 diabetes mellitus with diabetic polyneuropathy, without long-term current use of insulin ([place]) 03/18/2022   Panic attacks 06/23/2020   Aortic calcification ([place]) 02/22/2020   Carotid artery calcification, bilateral 02/22/2020   GERD  (gastroesophageal reflux disease)    Erectile dysfunction due to type 2 diabetes mellitus ([place]) 06/30/2018   IBS (irritable bowel syndrome) 12/24/2014   Restless legs syndrome (RLS) 11/20/2012   CALCULUS, KIDNEY 05/30/2002    Past Medical History:  Diagnosis Date   Angio-edema    Anxiety    Depression    Diabetes mellitus type II    type 2   GERD (gastroesophageal reflux disease)    Headache    migraine   History of kidney stones    HLD (hyperlipidemia)    HTN (hypertension)    Morbid obesity (HCC)    Paroxysmal atrial fibrillation (HCC)    Restless legs syndrome (RLS) 11/20/2012   Stroke ([place]) 02/14/2020   ischemic/right sided affected   Tobacco abuse    Ulcer of left ankle (HCC)    last 2 months dime size dry dressing changing q day    Past Surgical History:  Procedure Laterality Date   Arm surgery Right 1969   Abscess excision and debridement at elbow   COLONOSCOPY WITH PROPOFOL N/A 09/06/2016   Procedure: COLONOSCOPY WITH PROPOFOL;  Surgeon: Doran Stabler, MD;  Location: [facility];  Service: Gastroenterology;  Laterality: N/A;   I & D EXTREMITY Left 05/29/2014   Procedure: IRRIGATION AND DEBRIDEMENT OF LEFT LEG WOUND AND PLACEMENT OF  INTEGRA  AND  VAC;  Surgeon: Theodoro Kos, DO;  Location: [facility];  Service: Plastics;  Laterality: Left;   implantable loop recorder implant  03/06/2020   Medtronic Reveal [place] model LNQ11 (SN  [id number]) implanted by Dr Rayann Heman for cryptogenic stroke   INCISION AND DRAINAGE OF WOUND Left 04/23/2014   Procedure: IRRIGATION AND DEBRIDEMENT LOWER LEFT LEG WOUND WITH PLACEMENT OF INTEGRA AND VAC;  Surgeon: Theodoro Kos, DO;  Location: [facility];  Service: Plastics;  Laterality: Left;   kidney stone removal  [id number]   MINOR APPLICATION OF WOUND VAC Left 04/23/2014   Procedure: MINOR APPLICATION OF WOUND VAC;  Surgeon: Theodoro Kos, DO;  Location: [facility];  Service: Plastics;   Laterality: Left;   SKIN SPLIT GRAFT Left 06/18/2014   Procedure: SKIN GRAFT SPLIT THICKNESS TO LOWER LEFT LEG WOUND WITH PLACEMENT OF VAC;  Surgeon: Theodoro Kos, DO;  Location: [facility];  Service: Plastics;  Laterality: Left;   VASECTOMY  1994    Family History  Problem Relation Age of Onset   Stroke Mother        multiple   Leukemia Mother    Pneumonia Mother    Cancer Mother    Diabetes Mother    Diabetes Father    Heart disease Father    Hyperlipidemia Father    Hypertension Father    Allergic rhinitis Neg Hx    Angioedema Neg Hx    Asthma Neg Hx    Eczema Neg Hx    Immunodeficiency Neg Hx    Urticaria Neg Hx     Social History   Social History Narrative   Lives with wife   Right Handed   Drink 6-7 cups caffeine daily    Past Medical History, Surgical History, Social History, Family History, Problem  List, Medications, and Allergies have been reviewed and updated if relevant.  Review of Systems: Pertinent positives are listed above.  Otherwise, a full 14 point review of systems has been done in full and it is negative except where it is noted positive.  Objective:   There were no vitals taken for this visit.    02/14/2020   11:41 AM 02/15/2020    3:00 AM 02/15/2020    8:00 AM  Fall Risk  (RETIRED) Patient Fall Risk Level Moderate fall risk Low fall risk Low fall risk   Ideal Body Weight:   No results found.    01/19/2022    9:59 AM 07/22/2021    9:08 AM 06/22/2020   10:04 AM 06/28/2018    2:54 PM 06/26/2017    9:25 AM  Depression screen PHQ 2/9  Decreased Interest 2 2 0 0 0  Down, Depressed, Hopeless '2 3 1 '$ 0 0  PHQ - 2 Score '4 5 1 '$ 0 0  Altered sleeping 2 3 0    Tired, decreased energy 2 3 0    Change in appetite 1 2 0    Feeling bad or failure about yourself  0 2 1    Trouble concentrating '3 3 1    '$ Moving slowly or fidgety/restless '3 3 1    '$ Suicidal thoughts 0 2 0    PHQ-9 Score '15 23 4    '$ Difficult doing work/chores Very difficult  Extremely dIfficult Somewhat difficult       GEN: well developed, well nourished, no acute distress Eyes: conjunctiva and lids normal, PERRLA, EOMI ENT: TM clear, nares clear, oral exam WNL Neck: supple, no lymphadenopathy, no thyromegaly, no JVD Pulm: clear to auscultation and percussion, respiratory effort normal CV: regular rate and rhythm, S1-S2, no murmur, rub or gallop, no bruits, peripheral pulses normal and symmetric, no cyanosis, clubbing, edema or varicosities GI: soft, non-tender; no hepatosplenomegaly, masses; active bowel sounds all quadrants GU: deferred Lymph: no cervical, axillary or inguinal adenopathy MSK: gait normal, muscle tone and strength WNL, no joint swelling, effusions, discoloration, crepitus  SKIN: clear, good turgor, color WNL, no rashes, lesions, or ulcerations Neuro: normal mental status, normal strength, sensation, and motion Psych: alert; oriented to person, place and time, normally interactive and not anxious or depressed in appearance.  All labs reviewed with patient.  Results for orders placed or performed in visit on 07/19/22  PSA, total and free  Result Value Ref Range   Prostate Specific Ag, Serum 0.4 0.0 - 4.0 ng/mL   PSA, Free 0.09 N/A ng/mL   PSA, Free Pct 22.5 %  Microalbumin / creatinine urine ratio  Result Value Ref Range   Creatinine, Urine 42.0 Not Estab. mg/dL   Microalbumin, Urine 12.1 Not Estab. ug/mL   Microalb/Creat Ratio 29 0 - 29 mg/g creat  TSH  Result Value Ref Range   TSH 0.929 0.450 - 4.500 uIU/mL  VITAMIN D 25 Hydroxy (Vit-D Deficiency, Fractures)  Result Value Ref Range   Vit D, 25-Hydroxy 37.7 30.0 - 100.0 ng/mL  Vitamin B12  Result Value Ref Range   Vitamin B-12 1,310 (H) 232 - 1,245 pg/mL  CBC with Differential/Platelet  Result Value Ref Range   WBC 7.2 3.4 - 10.8 x10E3/uL   RBC 5.41 4.14 - 5.80 x10E6/uL   Hemoglobin 16.1 13.0 - 17.7 g/dL   Hematocrit 46.1 37.5 - 51.0 %   MCV 85 79 - 97 fL   MCH 29.8 26.6 -  33.0 pg   MCHC  34.9 31.5 - 35.7 g/dL   RDW 14.1 11.6 - 15.4 %   Platelets 182 150 - 450 x10E3/uL   Neutrophils 65 Not Estab. %   Lymphs 21 Not Estab. %   Monocytes 11 Not Estab. %   Eos 2 Not Estab. %   Basos 1 Not Estab. %   Neutrophils Absolute 4.6 1.4 - 7.0 x10E3/uL   Lymphocytes Absolute 1.5 0.7 - 3.1 x10E3/uL   Monocytes Absolute 0.8 0.1 - 0.9 x10E3/uL   EOS (ABSOLUTE) 0.2 0.0 - 0.4 x10E3/uL   Basophils Absolute 0.0 0.0 - 0.2 x10E3/uL   Immature Granulocytes 0 Not Estab. %   Immature Grans (Abs) 0.0 0.0 - 0.1 [id number]  Basic metabolic panel  Result Value Ref Range   Glucose 136 (H) 70 - 99 mg/dL   BUN 20 8 - 27 mg/dL   Creatinine, Ser 0.98 0.76 - 1.27 mg/dL   eGFR 88 >59 mL/min/1.73   BUN/Creatinine Ratio 20 10 - 24   Sodium 144 134 - 144 mmol/L   Potassium 4.5 3.5 - 5.2 mmol/L   Chloride 106 96 - 106 mmol/L   CO2 22 20 - 29 mmol/L   Calcium 9.6 8.6 - 10.2 mg/dL  Hepatic function panel  Result Value Ref Range   Total Protein 5.9 (L) 6.0 - 8.5 g/dL   Albumin 4.2 3.9 - 4.9 g/dL   Bilirubin Total 0.4 0.0 - 1.2 mg/dL   Bilirubin, Direct 0.15 0.00 - 0.40 mg/dL   Alkaline Phosphatase 46 44 - 121 IU/L   AST 20 0 - 40 IU/L   ALT 35 0 - 44 IU/L  Lipid panel  Result Value Ref Range   Cholesterol, Total 118 100 - 199 mg/dL   Triglycerides 186 (H) 0 - 149 mg/dL   HDL 36 (L) >39 mg/dL   VLDL Cholesterol Cal 31 5 - 40 mg/dL   LDL Chol Calc (NIH) 51 0 - 99 mg/dL   Chol/HDL Ratio 3.3 0.0 - 5.0 ratio    Assessment and Plan:     ICD-10-CM   1. Healthcare maintenance  Z00.00       Health Maintenance Exam: The patient's preventative maintenance and recommended screening tests for an annual wellness exam were reviewed in full today. Brought up to date unless services declined.  Counselled on the importance of diet, exercise, and its role in overall health and mortality. The patient's FH and SH was reviewed, including their home life, tobacco status, and drug and alcohol  status.  Follow-up in 1 year for physical exam or additional follow-up below.  I have personally reviewed the Medicare Annual Wellness questionnaire and have noted 1. The patient's medical and social history 2. Their use of alcohol, tobacco or illicit drugs 3. Their current medications and supplements 4. The patient's functional ability including ADL's, fall risks, home safety risks and hearing or visual             impairment. 5. Diet and physical activities 6. Evidence for depression or mood disorders 7. Reviewed Updated provider list, see scanned forms and [facility] Snapshot.  8. Reviewed whether or not the patient has HCPOA or living will, and discussed what this means with the patient.  Recommended he bring in a copy for his chart in [facility].  The patients weight, height, BMI and visual acuity have been recorded in the chart I have made referrals, counseling and provided education to the patient based review of the above and I have provided the pt with a written  personalized care plan for preventive services.  I have provided the patient with a copy of your personalized plan for preventive services. Instructed to take the time to review along with their updated medication list.  Disposition: No follow-ups on file.  Future Appointments  Date Time Provider [place]  07/25/2022  9:40 AM [name] Loffler, MD [facility]  07/28/2022 11:00 AM [name], [facility] None  08/01/2022  7:10 AM [facility] DEVICE REMOTES [facility]  08/09/2022 11:20 AM WL-NM PET CT 1 WL-NM [place]  08/12/2022 10:30 AM MC-CV BURL [place] 1 CV-BURL None  08/30/2022 10:30 AM White, Louanna Raw, NP CP-CP None  08/31/2022  2:00 PM Marzetta Board, DPM [facility]  09/05/2022  7:10 AM [facility] DEVICE REMOTES [facility]  09/14/2022 10:20 AM End, Harrell Gave, MD CVD-[facility] None  10/10/2022  7:05 AM [facility] DEVICE REMOTES [facility]  10/28/2022 12:10 PM Shamleffer,  Melanie Crazier, MD [facility] None  11/14/2022  7:05 AM [facility] DEVICE REMOTES [facility]  12/19/2022  7:00 AM [facility] DEVICE REMOTES [facility]  01/05/2023  1:00 PM Garvin Fila, MD [facility] None    No orders of the defined types were placed in this encounter.  There are no discontinued medications. No orders of the defined types were placed in this encounter.   Signed,  Maud Deed. Broc Caspers, MD   Allergies as of 07/25/2022       Reactions   Cashew Nut Oil Anaphylaxis   Cashews, 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   Pistachio Nut (diagnostic) Hives, Shortness Of Breath, Itching, Swelling, Rash   Latex Dermatitis   Tape Other (See Comments)   Tears and bruises the skin   Betadine [povidone Iodine] Rash, Other (See Comments)   Raised rash 04/23/14   Lidocaine Other (See Comments)   Minimal effect with lidocaine, prefers different anesthetic   Penicillins Rash, Other (See Comments)   Rash all over as a child        Medication List        Accurate as of July 24, 2022 11:32 AM. If you have any questions, ask your nurse or doctor.          acetaminophen 500 MG tablet Commonly known as: TYLENOL Take 1,000 mg by mouth 2 (two) times daily as needed (for pain).   busPIRone 5 MG tablet Commonly known as: BUSPAR Take 1 tablet (5 mg total) by mouth 2 (two) times daily.   busPIRone 7.5 MG tablet Commonly known as: BUSPAR Take 1 tablet (7.5 mg total) by mouth 2 (two) times daily.   busPIRone 10 MG tablet Commonly known as: BUSPAR Take 10 mg by mouth 2 (two) times daily.   cetirizine 10 MG tablet Commonly known as: ZYRTEC Take 10 mg by mouth daily.   clonazePAM 0.5 MG tablet Commonly known as: KLONOPIN TAKE 1/2 TO 1 TABLET(0.25 TO 0.5 MG) BY MOUTH TWICE DAILY AS NEEDED FOR ANXIETY   Contour Next Test test strip Generic drug: glucose blood USE TO CHECK BLOOD SUGAR   dapagliflozin propanediol 10 MG Tabs tablet Commonly known as:  Farxiga Take 1 tablet (10 mg total) by mouth daily.   diltiazem 30 MG tablet Commonly known as: CARDIZEM TAKE 1 TABLET BY MOUTH EVERY 4 HOURS AS NEEDED FOR HEART RATE>100.   Eliquis 5 MG Tabs tablet Generic drug: apixaban TAKE 1 TABLET(5 MG) BY MOUTH TWICE DAILY   EPINEPHrine 0.3 mg/0.3 mL Soaj injection Commonly known as: EPI-PEN Inject 0.3 mg into the muscle as needed for anaphylaxis.  FLUoxetine 40 MG capsule Commonly known as: PROZAC Take 2 capsules (80 mg total) by mouth daily.   lisinopril 40 MG tablet Commonly known as: ZESTRIL TAKE 1 TABLET(40 MG) BY MOUTH DAILY   metFORMIN 500 MG 24 hr tablet Commonly known as: GLUCOPHAGE-XR Take 4 tablets (2,000 mg total) by mouth daily in the afternoon.   Microlet Lancets Misc USE AS DIRECTED TWICE DAILY   multivitamin tablet Take 1 tablet by mouth daily.   pantoprazole 40 MG tablet Commonly known as: PROTONIX TAKE 1 TABLET(40 MG) BY MOUTH DAILY   pentoxifylline 400 MG CR tablet Commonly known as: TRENTAL TAKE 1 TABLET BY MOUTH THREE TIMES DAILY WITH MEALS   pregabalin 300 MG capsule Commonly known as: LYRICA TAKE 1 CAPSULE(300 MG) BY MOUTH TWICE DAILY   repaglinide 0.5 MG tablet Commonly known as: PRANDIN Take 1 tablet (0.5 mg total) by mouth daily before supper.   rOPINIRole 0.5 MG tablet Commonly known as: REQUIP TAKE 1 TABLET(0.5 MG) BY MOUTH TWICE DAILY   rosuvastatin 20 MG tablet Commonly known as: CRESTOR TAKE 1 TABLET(20 MG) BY MOUTH AT BEDTIME   Trulicity 4.5 0000000 Sopn Generic drug: Dulaglutide Inject 4.5 mg into the skin once a week.   varenicline 1 MG tablet Commonly known as: CHANTIX TAKE 1 TABLET(1 MG) BY MOUTH TWICE DAILY

## 2022-07-25 ENCOUNTER — Ambulatory Visit (INDEPENDENT_AMBULATORY_CARE_PROVIDER_SITE_OTHER): Payer: Commercial Managed Care - HMO | Admitting: Family Medicine

## 2022-07-25 ENCOUNTER — Other Ambulatory Visit: Payer: Self-pay

## 2022-07-25 ENCOUNTER — Encounter: Payer: Self-pay | Admitting: Family Medicine

## 2022-07-25 VITALS — BP 104/60 | HR 87 | Temp 97.3°F | Ht 69.5 in | Wt 294.4 lb

## 2022-07-25 DIAGNOSIS — Z23 Encounter for immunization: Secondary | ICD-10-CM | POA: Diagnosis not present

## 2022-07-25 DIAGNOSIS — E1159 Type 2 diabetes mellitus with other circulatory complications: Secondary | ICD-10-CM

## 2022-07-25 DIAGNOSIS — Z0000 Encounter for general adult medical examination without abnormal findings: Secondary | ICD-10-CM

## 2022-07-25 MED ORDER — TRULICITY 4.5 MG/0.5ML ~~LOC~~ SOAJ: 4.5000 mg | SUBCUTANEOUS | 3 refills | Status: DC

## 2022-07-25 MED ORDER — REPAGLINIDE 0.5 MG PO TABS: 0.5000 mg | ORAL_TABLET | Freq: Every day | ORAL | 1 refills | Status: DC

## 2022-07-25 MED ORDER — METFORMIN HCL ER 500 MG PO TB24: 2000.0000 mg | ORAL_TABLET | Freq: Every day | ORAL | 3 refills | Status: DC

## 2022-07-25 MED ORDER — DAPAGLIFLOZIN PROPANEDIOL 10 MG PO TABS: 10.0000 mg | ORAL_TABLET | Freq: Every day | ORAL | 3 refills | Status: DC

## 2022-07-28 ENCOUNTER — Ambulatory Visit (INDEPENDENT_AMBULATORY_CARE_PROVIDER_SITE_OTHER): Payer: Commercial Managed Care - HMO | Admitting: Psychology

## 2022-07-28 DIAGNOSIS — F411 Generalized anxiety disorder: Secondary | ICD-10-CM

## 2022-07-28 DIAGNOSIS — F331 Major depressive disorder, recurrent, moderate: Secondary | ICD-10-CM
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
## 2022-08-01 ENCOUNTER — Encounter: Payer: Self-pay | Admitting: Family Medicine

## 2022-08-01 ENCOUNTER — Ambulatory Visit (INDEPENDENT_AMBULATORY_CARE_PROVIDER_SITE_OTHER): Payer: Medicare HMO

## 2022-08-01 DIAGNOSIS — I639 Cerebral infarction, unspecified: Secondary | ICD-10-CM

## 2022-08-02 ENCOUNTER — Ambulatory Visit: Payer: Medicare HMO | Admitting: Neurology

## 2022-08-02 ENCOUNTER — Telehealth: Payer: Self-pay | Admitting: Family Medicine

## 2022-08-02 ENCOUNTER — Encounter: Payer: Self-pay | Admitting: Neurology

## 2022-08-02 VITALS — BP 127/72 | HR 75 | Ht 68.0 in | Wt 292.8 lb

## 2022-08-02 DIAGNOSIS — Z8673 Personal history of transient ischemic attack (TIA), and cerebral infarction without residual deficits: Secondary | ICD-10-CM | POA: Diagnosis not present

## 2022-08-02 DIAGNOSIS — G3184 Mild cognitive impairment, so stated: Secondary | ICD-10-CM

## 2022-08-02 DIAGNOSIS — I480 Paroxysmal atrial fibrillation: Secondary | ICD-10-CM

## 2022-08-02 DIAGNOSIS — H53461 Homonymous bilateral field defects, right side: Secondary | ICD-10-CM | POA: Diagnosis not present

## 2022-08-02 LAB — CUP PACEART REMOTE DEVICE CHECK
Date Time Interrogation Session: 20240229231321
Implantable Pulse Generator Implant Date: 20211008

## 2022-08-02 NOTE — Patient Instructions (Signed)
I had a long d/w patient about his remote cryptogenic strokes, mild residual aphasia, visual field defect and cognitive impairment, risk for recurrent stroke/TIAs, personally independently reviewed imaging studies and stroke evaluation results and answered questions.Continue Eliquis (apixaban) daily  for secondary stroke prevention for history of paroxysmal A-fib and maintain strict control of hypertension with blood pressure goal below 130/90, diabetes with hemoglobin A1c goal below 6.5% and lipids with LDL cholesterol goal below 70 mg/dL. I also advised the patient to eat a healthy diet with plenty of whole grains, cereals, fruits and vegetables, exercise regularly and maintain ideal body weight.  Refer to sleep clinic for discussion about treatment options for obstructive sleep apnea.  I also recommend he increase participation in cognitively challenging activities like solving crossword puzzles, playing bridge and sudoku.  We also discussed memory compensation strategies.  Patient may also consider possible participation in the  [place] AF trial if interested and will be given information to review at home and decide followup in the future with me in 1 year or call earlier if necessary.

## 2022-08-02 NOTE — Telephone Encounter (Signed)
Type of forms received:Medical form  Routed AP:[id number] pool  Paperwork received by : Gwynn Burly   Individual made aware of 3-5 business day turn around (Y/N):Y  Form completed and patient made aware of charges(Y/N):Y   Faxed to :   Form location: Place in PCP folder

## 2022-08-02 NOTE — Telephone Encounter (Signed)
Forms placed in Dr. Lillie Fragmin office in box to complete.

## 2022-08-02 NOTE — Progress Notes (Signed)
[facility] [address] [place]. [place] [address] [phone]       OFFICE FOLLOW UP VISIT NOTE  [name] [name] Date of Birth:  15-Sep-1960 Medical Record Number:  [id number]   Referring MD: Bonnielee Haff Reason for Referral: Stroke  HPI: Last visit 04/02/2020 [name] [name] is a 62 year old Caucasian male seen today for initial office consultation visit for stroke.  He is accompanied by his wife.  History is obtained from them and review of electronic medical records and I personally reviewed imaging films in PACS.62 y.o. male with medical history significant of hypertension, hyperlipidemia, type 2 diabetes mellitus, restless leg syndrome, diabetic neuropathy, morbid obesity, tobacco abuse, depression/anxiety, GERD, chronic venous stasis ulcer in bilateral lower extremities presented to emergency department with headache and right thigh vision loss started 2 AM on the morning of admission on 02/14/2020.  He initially went to his ophthalmologist and was referred to the emergency department.  Concern was for acute stroke.  Patient was hospitalized for further management.  CT perfusion scan suggested a left occipital lobe infarct.  MRI was attempted but patient refused due to significant claustrophobia.  Subsequent repeat CT scan confirmed left posterior division MCA infarct.  CT angiogram showed no significant large vessel stenosis or occlusion.  Lower extremity venous Dopplers were negative for DVT.  Transthoracic echo showed normal ejection fraction without cardiac source of embolism.  LDL cholesterol was quite low at 15 mg percent and hemoglobin A1c was elevated 8.4.  Patient was started on aspirin and Plavix for 3 weeks followed by aspirin alone.  Patient states is done well since discharge she is now beginning to see some movement in the right peripheral field of vision but is still not able to count fingers.  He still has trouble reading and comprehending.  His speech has  improved though it is still slightly hesitant.  Is tolerating aspirin well without bruising or bleeding.  Patient is unable to return to work in [employer] and is currently on short-term disability.  He has not been driving.  He denies any prior history of atrial fibrillation, syncope or palpitations.  He had outpatient loop recorder placed by Dr. Rayann Heman and so for paroxysmal A. fib has not yet been found.  He also complains of mild short-term memory and cognitive difficulties which are new since his stroke. Update 07/07/2020: He returns for follow-up after last visit 3 months ago.  Is accompanied by his wife.  Patient has noted improvement in his speech peripheral vision but thinks continues to have occasional word finding difficulties and has to speak in a slow.  He notices that if he tries to talk too fast or get excited he has more trouble.  He noticed slight improvement in his right-sided peripheral vision particularly in the quadrant though he still has significant vision loss in the lower quadrant and has not been driving.  He was recently seen by optometry wrist who asked him not to drive.  Patient had loop recorder inserted and so for paroxysmal A. fib has not yet been found.  He did participate in the [place] stroke trial and qualified and is on the trial medication.  Today he signed consent to be in the cognitive soft study of the trial.  He is tolerating the trial medications well without bleeding or bruising.  His blood pressure is well controlled and today it is borderline at 140/85.  He states his sugars are doing quite good and last A1c checked in January this year was  5.8.  His lipids are satisfactory.  He has been overall eating a healthy diet and is quite controlled on what he eats and is overall lost 130 pounds over the last 1 year.  He is finished outpatient speech therapy but is enrolled to participate in [place] summer program for speech and language.His Paxil dose was recently increased by his  primary care physician to help with his underlying anxiety and stress.  He does have diabetic peripheral neuropathy but Lyrica seems to help his paresthesias quite well and is able to sleep well at night.  He works as an Passenger transport manager in [employer] but clearly due to his cognitive and speech difficulties he will not be able to work and may have to go on long-term disability.   Update 12/23/2020: He returns for follow-up after last visit with me 5 months ago.  Is accompanied by his wife.  Patient's wife called me in March 2022 stating patient had been complaining of some new headaches for the last couple of weeks and on a couple of occasions  she noticed that he was found staring into the distance as if he was not there but however when she called out his name he responded right away.  Denied seeing any twitching some is lips or hands or any involuntary seizure-like movements .he had an outpatient MRI scan done on 07/30/2020 which showed small acute to subacute infarcts involving the right thalamocapsular junction, left frontal and bilateral parietal and left temporal regions with old chronic left PCA infarct and chronic right occipital and cerebellar and thalamic infarcts as well.  He had outpatient EEG on 09/01/2022 which was slightly abnormal showing mild slowing but no epileptiform activity.  Patient was in the [place] trial and given the strokes he was asked to discontinue the study medication and was started on Eliquis after he was found to have paroxysmal A. fib on the loop recorder on 09/05/2020.  Patient is tolerating Eliquis well with only minor bruising and no bleeding.  Continues to have short-term memory difficulties and mild word finding difficulties as well.  He has trouble with multitasking as well.  He recently had neuropsych testing which she had a tough time completing however I do not have the results to review today.  He is doing some crossword puzzles but not doing many mentally challenging  activities at present.  Is also not been physically very active.  He uses a cane to walk he has had no falls or injuries.  He has had no further recurrent stroke or TIA symptoms. Update 07/06/2021 : He returns for follow-up after last visit 6 months ago.  He is accompanied by his wife he is doing well.  He has had no recurrent TIA or stroke symptoms.  He remains on Eliquis which is tolerating well without bruising or bleeding.  States diabetes is under good control and last hemoglobin A1c was 6.7 on 06/08/2021.  His blood pressure also is under good control and today it is 109/71.  He remains on Crestor which is tolerating well without muscle aches and pains.  Patient has been recently started on Paxil for anxiety depression which is not sure is working yet.  Continues to have short-term memory difficulties which are present but not getting worse.  He is mostly independent in activities of daily living.  Team needs slight help with getting in and out of shower more for safety reasons we can bathe himself.  He also occasionally stumbles on certain words and when  he tries to talk fast.  His mental processing is also slow.  Patient has known obstructive sleep apnea in the past had seen a pulmonologist and was prescribed CPAP but he lost a lot of weight and after that he was told him in no longer needed.  He also has some trouble reading he can see the words but sometimes they do not make sense and he is very slow with his reading. Update 08/02/2022 ; he returns for follow-up after last visit a year ago.  He is accompanied by his wife.  States he is doing well.  He has had no recurrent stroke or TIA symptoms.  He remains on Eliquis which is tolerating well without bruising or bleeding.  His blood pressure is under good control today it is 127/72.  He is tolerating Crestor well without muscle aches and pains.  Last lipid profile on 07/22/2022 showed LDL cholesterol to be 51 mg percent.  Hemoglobin A1c was 6.7 on 06/10/2022.   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  No acute abnormality.  Patient continues to have mild short-term memory difficulties but these appear to be unchanged and nonprogressive.  Currently mild help with activities of daily living but can manage most of his affairs himself.  He does participate in some cognitively challenging activities like solving crossword puzzles and doing sudoku.  He has no new complaints.  Continues to have right-sided peripheral vision loss he is careful and has not had any falls or injuries  ROS:   14 system review of systems is positive for vision difficulties memory loss, speech and word finding difficulties difficulty with reading and comprehension, anxiety all other systems negative PMH:  Past Medical History:  Diagnosis Date   Angio-edema    Anxiety    Depression    Diabetes mellitus type II    type 2   GERD (gastroesophageal reflux disease)    Headache    migraine   History of kidney stones    HLD (hyperlipidemia)    HTN (hypertension)    Morbid obesity (HCC)    Paroxysmal atrial fibrillation (HCC)    Restless legs syndrome (RLS) 11/20/2012   Stroke ([place]) 02/14/2020   ischemic/right sided affected   Tobacco abuse    Ulcer of left ankle (HCC)    last 2 months dime size dry dressing changing q day    Social History:  Social History   Socioeconomic History   Marital status: Married    Spouse name: Regulatory affairs officer   Number of children: 3   Years of education: Not on file   Highest education level: Not on file  Occupational History   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    Comment: no longer working 07/07/20  Tobacco Use   Smoking status: Every Day    Types: E-cigarettes    Start date: 05/05/2021   Smokeless tobacco: Never   Tobacco comments:    half pack daily  Vaping Use    Vaping Use: Every day   Substances: Flavoring  Substance and Sexual Activity   Alcohol use: Not Currently    Alcohol/week: 0.0 standard drinks of alcohol    Comment: rarely   Drug use: No   Sexual activity: Yes    Partners: Female  Other Topics Concern   Not on file  Social History Narrative  Lives with wife   Right Handed   Drink 6-7 cups caffeine daily   Social Determinants of Health   Financial Resource Strain: Not on file  Food Insecurity: Not on file  Transportation Needs: Not on file  Physical Activity: Not on file  Stress: Not on file  Social Connections: Not on file  Intimate Partner Violence: Not on file    Medications:   Current Outpatient Medications on File Prior to Visit  Medication Sig Dispense Refill   acetaminophen (TYLENOL) 500 MG tablet Take 1,000 mg by mouth 2 (two) times daily as needed (for pain).     apixaban (ELIQUIS) 5 MG TABS tablet TAKE 1 TABLET(5 MG) BY MOUTH TWICE DAILY 60 tablet 5   cetirizine (ZYRTEC) 10 MG tablet Take 10 mg by mouth daily.     clonazePAM (KLONOPIN) 0.5 MG tablet TAKE 1/2 TO 1 TABLET(0.25 TO 0.5 MG) BY MOUTH TWICE DAILY AS NEEDED FOR ANXIETY 60 tablet 0   dapagliflozin propanediol (FARXIGA) 10 MG TABS tablet Take 1 tablet (10 mg total) by mouth daily. 90 tablet 3   diltiazem (CARDIZEM) 30 MG tablet TAKE 1 TABLET BY MOUTH EVERY 4 HOURS AS NEEDED FOR HEART RATE>100. 30 tablet 1   Dulaglutide (TRULICITY) 4.5 0000000 SOPN Inject 4.5 mg into the skin once a week. 6 mL 3   DULoxetine (CYMBALTA) 60 MG capsule Take 60 mg by mouth daily.     EPINEPHrine 0.3 mg/0.3 mL IJ SOAJ injection Inject 0.3 mg into the muscle as needed for anaphylaxis. 2 each 0   FLUoxetine (PROZAC) 40 MG capsule Take 2 capsules (80 mg total) by mouth daily. 180 capsule 1   glucose blood (CONTOUR NEXT TEST) test strip USE TO CHECK BLOOD SUGAR 100 strip 5   hydrOXYzine (ATARAX) 10 MG tablet Take 10 mg by mouth 2 (two) times daily as needed.     lisinopril (ZESTRIL)  40 MG tablet TAKE 1 TABLET(40 MG) BY MOUTH DAILY 90 tablet 3   metFORMIN (GLUCOPHAGE-XR) 500 MG 24 hr tablet Take 4 tablets (2,000 mg total) by mouth daily in the afternoon. 360 tablet 3   Microlet Lancets MISC USE AS DIRECTED TWICE DAILY 100 each 5   Multiple Vitamin (MULTIVITAMIN) tablet Take 1 tablet by mouth daily.     pantoprazole (PROTONIX) 40 MG tablet TAKE 1 TABLET(40 MG) BY MOUTH DAILY 90 tablet 3   pentoxifylline (TRENTAL) 400 MG CR tablet TAKE 1 TABLET BY MOUTH THREE TIMES DAILY WITH MEALS 270 tablet 0   pregabalin (LYRICA) 300 MG capsule TAKE 1 CAPSULE(300 MG) BY MOUTH TWICE DAILY 60 capsule 5   repaglinide (PRANDIN) 0.5 MG tablet Take 1 tablet (0.5 mg total) by mouth daily before supper. 90 tablet 1   rOPINIRole (REQUIP) 0.5 MG tablet TAKE 1 TABLET(0.5 MG) BY MOUTH TWICE DAILY 180 tablet 3   rosuvastatin (CRESTOR) 20 MG tablet TAKE 1 TABLET(20 MG) BY MOUTH AT BEDTIME 90 tablet 0   varenicline (CHANTIX) 1 MG tablet TAKE 1 TABLET(1 MG) BY MOUTH TWICE DAILY 60 tablet 3   No current facility-administered medications on file prior to visit.    Allergies:   Allergies  Allergen Reactions   Cashew Nut Oil Anaphylaxis    Cashews, 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   Pistachio Nut (Diagnostic) Hives, Shortness Of Breath, Itching, Swelling and Rash   Latex Dermatitis   Tape Other (See Comments)    Tears and bruises the skin   Betadine [Povidone Iodine] Rash and Other (See Comments)    Raised rash  04/23/14   Lidocaine Other (See Comments)    Minimal effect with lidocaine, prefers different anesthetic   Penicillins Rash and Other (See Comments)    Rash all over as a child    Physical Exam General: Obese middle-aged Caucasian male seated, in no evident distress Head: head normocephalic and atraumatic.   Neck: supple with no carotid or supraclavicular bruits Cardiovascular: regular rate and rhythm, no murmurs Musculoskeletal: no deformity Skin:  no rash/petichiae Vascular:  Normal pulses all  extremities  Neurologic Exam Mental Status: Awake and fully alert. Oriented to place and time. Recent and remote memory intact. Attention span, concentration and fund of knowledge appropriate. Mood and affect appropriate.  Speech is slightly nonfluent, deliberate and hesitant but no dysarthria or paraphasic errors.  Diminished recall 1/3.  Able to name 10 animals which can walk on 4 legs.  Clock drawing 4/4. Cranial Nerves: Fundoscopic exam not done. Pupils equal, briskly reactive to light. Extraocular movements full without nystagmus. Visual fields show partial right homonymous hemianopsia to confrontation with more deficit in the inferior quadrant.Marland Kitchen Hearing intact. Facial sensation intact.  Mild right lower facial asymmetry.  Tongue, palate moves normally and symmetrically.  Motor: Normal bulk and tone. Normal strength in all tested extremity muscles. Sensory.: intact to touch , pinprick , position and vibratory sensation.  Coordination: Rapid alternating movements normal in all extremities. Finger-to-nose and heel-to-shin performed accurately bilaterally. Gait and Station: Arises from chair without difficulty. Stance is normal. Gait demonstrates normal stride length and balance . Able to heel, toe and tandem walk with great difficulty.  Reflexes: 1+ and symmetric. Toes downgoing.   NIHSS  3 Modified Rankin  2   ASSESSMENT: 62 year old Caucasian male with embolic left MCA branch infarct in September 2021 and bicerebral small infarcts in March 2022 of cryptogenic etiology.  But subsequently paroxysmal A. fib found on loop recorder on 09/05/2020. Vascular risk factors of diabetes, hypertension hyperlipidemia. PAF and obesity.  He also has persistent right-sided peripheral vision loss, mild expressive language difficulties mild post stroke cognitive impairment.  He is doing well and stable from neurovascular standpoint.     PLAN: I had a long d/w patient about his remote cryptogenic strokes, mild  residual aphasia, visual field defect and cognitive impairment, risk for recurrent stroke/TIAs, personally independently reviewed imaging studies and stroke evaluation results and answered questions.Continue Eliquis (apixaban) daily  for secondary stroke prevention for history of paroxysmal A-fib and maintain strict control of hypertension with blood pressure goal below 130/90, diabetes with hemoglobin A1c goal below 6.5% and lipids with LDL cholesterol goal below 70 mg/dL. I also advised the patient to eat a healthy diet with plenty of whole grains, cereals, fruits and vegetables, exercise regularly and maintain ideal body weight.  Refer to sleep clinic for discussion about treatment options for obstructive sleep apnea.  I also recommend he increase participation in cognitively challenging activities like solving crossword puzzles, playing bridge and sudoku.  We also discussed memory compensation strategies.  Patient may also consider possible participation in the  [place] AF trial if interested and will be given information to review at home and decide followup in the future with me in 1 year or call earlier if necessary.Greater than 50% time during this prolonged 35-minute visit was spent on counseling and coordination of care about his cryptogenic stroke discussion about stroke prevention treatment and answering questions. Antony Contras, MD Note: This document was prepared with digital dictation and possible smart phrase technology. Any transcriptional errors that result from this  process are unintentional.

## 2022-08-03 ENCOUNTER — Ambulatory Visit: Payer: Medicare HMO | Admitting: Psychology

## 2022-08-03 DIAGNOSIS — F331 Major depressive disorder, recurrent, moderate: Secondary | ICD-10-CM

## 2022-08-03 DIAGNOSIS — F411 Generalized anxiety disorder: Secondary | ICD-10-CM | POA: Diagnosis not present

## 2022-08-03 NOTE — Telephone Encounter (Signed)
Completed form for [name] and [name] faxed to [facility] at [phone].  [name] notified by telephone that forms are ready to be picked up at the front desk.

## 2022-08-03 NOTE — Progress Notes (Signed)
[facility] Counselor/Therapist Progress Note  Patient ID: [name] [name], MRN: [id number],    Date: 08/03/2022  Time Spent: 45 mins  Treatment Type: Individual Therapy  Reported Symptoms: Pt presents for session, via WebEx video.  Pt grants consent for the session, stating his is in his home with no one else present.  I shared with pt that I am in my office with no one else here either.   Mental Status Exam: Appearance:  Casual     Behavior: Appropriate  Motor: Normal  Speech/Language:  Clear and Coherent  Affect: Depressed and Flat  Mood: depressed  Thought process: normal  Thought content:   WNL  Sensory/Perceptual disturbances:   WNL  Orientation: oriented to person, place, and situation  Attention: Good  Concentration: Fair  Memory: Recent;   [place] of knowledge:  Good  Insight:   Good  Judgment:  Good  Impulse Control: Good   Risk Assessment: Danger to Self:  No Self-injurious Behavior: No Danger to Others: No Duty to Warn:no Physical Aggression / Violence:No  Access to Firearms a concern: No  Gang Involvement:No   Pt also mentions that he wants to discuss his childhood and how it influence who he grew to be as an adult.  We will start this conversation in our session next week.   Subjective: Pt shares that he "has been ok since our last session; good days and bad days.  I went to Dr. [name] office and she is adjusting one of my pills and I am not worrying quite as much about things."  Pt shares that he slept until 1pm today and that is unusual for pt.  Pt shares that [name] continues to see the chiropractor daily and she continues to improve.  Pt shares he saw his neurologist yesterday and the doctor was pleased; pt still has issues with his gait and with short term memory.  Pt shares he is going to be participating in a drug study related to blood thinners.  Pt shares he continues to try to lose weight (140 pounds so far) but has been stuck for a  while.  He is seeing his new cardiologist again in March after his doppler study of his legs and a PET scan.  Pt shares his mood has been pretty good in the past week; his mood tends to be effected by his diabetic neuropathy pain, which is understandable.  Pt is observed vaping during the session; pt states he "smoked for a long time in my life and it is so hard to quit.  I started smoking when I was 62 yo and have used nicotine for the past 48 yrs."  Pt shares that his endocrinologist likes where his numbers are from all his weight loss; his A1C went from 13 to 5.5 and his new doctor wants him to be at 6-7.  Pt shares that [name]'s boyfriend is still out of the house and pt is happy about that.  Her new job is very stressful and she is hoping it will work out [employer].  Pt shares that it is hard for her to be alone but he is hopeful that she will not let him come back.  She is also divorcing her first husband at this time.  [name] is still with her boyfriend but he is still getting treatment for his MH conditions.  Pt shares that he and [name] plan to color together soon and also work on his puzzles in the garage soon.  Pt shares that sometimes he worries about falling asleep because he is afraid he will not wake up the next day.  Talked with pt about using deep breathing and using plans for the next day to help him shift this focus and worry.  Pt also mentions that he wants to discuss his childhood and how it influence who he grew to be as an adult.  We will start this conversation in our session next week.  Pt has an appt for medication management at [facility] on 4/2.  Pt shares that he has not taken any Klonopin since last week's session.  Encouraged pt to continue with his self care activities and we will meet next week for a follow up session.  Interventions: Cognitive Behavioral Therapy  Diagnosis:Moderate episode of recurrent major depressive disorder ([place])  Generalized anxiety disorder  Plan: Treatment  Plan Strengths/Abilities:  Intelligent, Intuitive, Willing to participate in therapy Treatment Preferences:  Outpatient Individual Therapy Statement of Needs:  Patient is to use CBT, mindfulness and coping skills to help manage and/or decrease symptoms associated with their diagnosis. Symptoms:  Depressed/Irritable mood, worry, social withdrawal Problems Addressed:  Depressive thoughts, Sadness, Sleep issues, etc. Long Term Goals:  Pt to reduce overall level, frequency, and intensity of the feelings of depression/anxiety as evidenced by decreased irritability, negative self talk, and helpless feelings from 6 to 7 days/week to 0 to 1 days/week, per client report, for at least 3 consecutive months.  Progress: 20% Short Term Goals:  Pt to verbally express understanding of the relationship between feelings of depression/anxiety and their impact on thinking patterns and behaviors.  Pt to verbalize an understanding of the role that distorted thinking plays in creating fears, excessive worry, and ruminations.  Progress: 20% Target Date:  06/03/2023 Frequency:  Bi-weekly Modality:  Cognitive Behavioral Therapy Interventions by Therapist:  Therapist will use CBT, Mindfulness exercises, Coping skills and Referrals, as needed by client. Client has verbally approved this treatment plan.  [name], [facility]

## 2022-08-03 NOTE — Telephone Encounter (Signed)
Done and placed in Donna's box.

## 2022-08-04 ENCOUNTER — Other Ambulatory Visit: Payer: Self-pay | Admitting: Neurology

## 2022-08-04 MED ORDER — STUDY - LIBREXIA-AF - APIXABAN 5 MG OR PLACEBO CAPSULE (PI-SETHI): 5.0000 mg | ORAL_CAPSULE | Freq: Two times a day (BID) | ORAL | 0 refills | Status: DC

## 2022-08-04 MED ORDER — STUDY - LIBREXIA-AF - JNJ-70033093 (MILVEXIAN) 100 MG OR PLACEBO (PI-SETHI): 1.0000 | ORAL_TABLET | Freq: Two times a day (BID) | ORAL | 0 refills | Status: DC

## 2022-08-04 NOTE — Addendum Note (Signed)
Addended byAcey Lav D on: 08/04/2022 01:57 PM   Modules accepted: Orders

## 2022-08-04 NOTE — Progress Notes (Signed)
[place] AF study Note   Patient is participating in the [place] AF study (eliquis versus factor XI inhibitor Milvexian ) .  He was given information to review last week and appeared interested.  Form was given to review at home to be discussed with his family.  I communicated with his cardiologist Dr. Saunders Revel and got his opinion on patient's participation.  The patient was informed about his recent [place] A-fib trial being stopped early possibly due to lack of efficacy but specific details are yet to be published.  He understood that [place] belongs to the same class of factor XI inhibitor as Asundexian but we definitely do not have head-to-head comparison with Eliquis as of yet.  It was made clear to patient that his participation is voluntary and he can withdraw consent at any point in the future if he chooses.  Patient will get the same excellent clinical care irrespective of whether he participates in the study or not.  He was given adequate time to ask questions which were all answered to his satisfaction.  Patient signed the Consent form in my presence.  No study specific procedure was done before signing ICF.  Patient was screened and met all inclusion criteria which were verified by me and the research coordinator Ivan Croft.  [facility] was notified in advance.  Patient was advised to stop taking Eliquis and will be given with study medication bottle today to take home with instructions how to take it.  Will return for follow-up in the future as per study protocol.  Patient expressed understanding.   Antony Contras, MD

## 2022-08-05 ENCOUNTER — Other Ambulatory Visit: Payer: Self-pay | Admitting: *Deleted

## 2022-08-05 ENCOUNTER — Telehealth (HOSPITAL_COMMUNITY): Payer: Self-pay | Admitting: *Deleted

## 2022-08-05 MED ORDER — LISINOPRIL 40 MG PO TABS: ORAL_TABLET | ORAL | 3 refills | Status: DC

## 2022-08-05 MED ORDER — PANTOPRAZOLE SODIUM 40 MG PO TBEC: DELAYED_RELEASE_TABLET | ORAL | 3 refills | Status: DC

## 2022-08-05 MED ORDER — ROSUVASTATIN CALCIUM 20 MG PO TABS: ORAL_TABLET | ORAL | 3 refills | Status: DC

## 2022-08-05 MED ORDER — PENTOXIFYLLINE ER 400 MG PO TBCR: 400.0000 mg | EXTENDED_RELEASE_TABLET | Freq: Three times a day (TID) | ORAL | 3 refills | Status: DC

## 2022-08-05 MED ORDER — EPINEPHRINE 0.3 MG/0.3ML IJ SOAJ: 0.3000 mg | INTRAMUSCULAR | 0 refills | Status: DC | PRN

## 2022-08-05 MED ORDER — ROPINIROLE HCL 0.5 MG PO TABS: ORAL_TABLET | ORAL | 3 refills | Status: DC

## 2022-08-05 MED ORDER — EPINEPHRINE 0.3 MG/0.3ML IJ SOAJ
0.3000 mg | INTRAMUSCULAR | 0 refills | Status: DC | PRN
  Filled 2023-06-27: qty 2, 7d supply, fill #0

## 2022-08-05 MED ORDER — DILTIAZEM HCL 30 MG PO TABS: ORAL_TABLET | ORAL | 0 refills | Status: DC

## 2022-08-05 MED ORDER — DILTIAZEM HCL 30 MG PO TABS: ORAL_TABLET | ORAL | 0 refills | Status: AC

## 2022-08-05 NOTE — Telephone Encounter (Signed)
Attempted to call patient regarding upcoming cardiac PET appointment. Left message on voicemail with name and callback number  Gordy Clement RN Navigator Cardiac Imaging [facility] [phone] Office [phone] Cell  Reminder to for patient to avoid caffeine 12 hours prior to his cardiac PET scan.

## 2022-08-05 NOTE — Telephone Encounter (Signed)
Patient's wife returning call about the patient's cardiac imaging study; pt's wife verbalizes understanding of appt date/time, parking situation and where to check in, pre-test NPO status ; name and call back number provided for further questions should they arise  [facility] [phone] office [phone] cell  Patient's wife is aware that the patient is to avoid caffeine 12 hours prior to his cardiac PET scan.

## 2022-08-05 NOTE — Telephone Encounter (Signed)
Last office visit 07/25/2022 for CPE.  Changing to [employer].

## 2022-08-06 MED ORDER — PREGABALIN 300 MG PO CAPS: ORAL_CAPSULE | ORAL | 3 refills | Status: DC

## 2022-08-06 NOTE — Telephone Encounter (Signed)
Called and cancelled refills that were sent to [facility] in error.

## 2022-08-09 ENCOUNTER — Other Ambulatory Visit: Payer: Self-pay | Admitting: Family Medicine

## 2022-08-09 ENCOUNTER — Other Ambulatory Visit: Payer: Self-pay

## 2022-08-09 ENCOUNTER — Encounter (HOSPITAL_COMMUNITY)
Admission: RE | Admit: 2022-08-09 | Discharge: 2022-08-09 | Disposition: A | Discharge status: Home / Self Care | Payer: Medicare HMO | Source: Ambulatory Visit | Attending: Internal Medicine | Admitting: Internal Medicine

## 2022-08-09 DIAGNOSIS — R072 Precordial pain: Secondary | ICD-10-CM | POA: Insufficient documentation

## 2022-08-09 DIAGNOSIS — I2510 Atherosclerotic heart disease of native coronary artery without angina pectoris: Secondary | ICD-10-CM | POA: Diagnosis not present

## 2022-08-09 LAB — NM PET CT CARDIAC PERFUSION MULTI W/ABSOLUTE BLOODFLOW
LV dias vol: 164 mL (ref 62–150)
LV sys vol: 96 mL
MBFR: 2.49
Nuc Rest EF: 41 %
Nuc Stress EF: 50 %
Peak HR: 102 {beats}/min
Rest HR: 77 {beats}/min
Rest MBF: 0.84 ml/g/min
Rest Nuclear Isotope Dose: 30.1 mCi
Rest perfusion cavity size (mL): 164 mL
ST Depression (mm): 0 mm
Stress MBF: 2.09 ml/g/min
Stress Nuclear Isotope Dose: 30.1 mCi
Stress perfusion cavity size (mL): 166 mL
TID: 1.01

## 2022-08-09 MED ORDER — RUBIDIUM RB82 GENERATOR (RUBYFILL)
30.1600 | PACK | Freq: Once | INTRAVENOUS | Status: AC
  Administered 2022-08-09: 30.16 via INTRAVENOUS

## 2022-08-09 MED ORDER — TRULICITY 4.5 MG/0.5ML ~~LOC~~ SOAJ: 4.5000 mg | SUBCUTANEOUS | 3 refills | Status: DC

## 2022-08-09 MED ORDER — DEXTROSE 5 % IV SOLN
INTRAVENOUS | Status: AC
  Filled 2022-08-09: qty 50

## 2022-08-09 MED ORDER — REGADENOSON 0.4 MG/5ML IV SOLN
0.4000 mg | Freq: Once | INTRAVENOUS | Status: AC
  Administered 2022-08-09: 0.4 mg via INTRAVENOUS

## 2022-08-09 MED ORDER — REGADENOSON 0.4 MG/5ML IV SOLN
INTRAVENOUS | Status: AC
  Filled 2022-08-09: qty 5

## 2022-08-09 MED ORDER — RUBIDIUM RB82 GENERATOR (RUBYFILL)
30.1700 | PACK | Freq: Once | INTRAVENOUS | Status: AC
  Administered 2022-08-09: 30.17 via INTRAVENOUS

## 2022-08-09 MED ORDER — CAFFEINE CITRATE BASE COMPONENT 10 MG/ML IV SOLN
INTRAVENOUS | Status: AC
  Filled 2022-08-09: qty 3

## 2022-08-09 MED ORDER — METFORMIN HCL ER 500 MG PO TB24: 2000.0000 mg | ORAL_TABLET | Freq: Every day | ORAL | 3 refills | Status: DC

## 2022-08-09 NOTE — Progress Notes (Signed)
Tolerated scan well

## 2022-08-11 ENCOUNTER — Other Ambulatory Visit: Payer: Self-pay

## 2022-08-11 ENCOUNTER — Telehealth: Payer: Self-pay | Admitting: Internal Medicine

## 2022-08-11 ENCOUNTER — Other Ambulatory Visit: Payer: Self-pay | Admitting: Family Medicine

## 2022-08-11 ENCOUNTER — Ambulatory Visit: Payer: Medicare HMO | Admitting: Psychology

## 2022-08-11 DIAGNOSIS — I480 Paroxysmal atrial fibrillation: Secondary | ICD-10-CM

## 2022-08-11 DIAGNOSIS — F411 Generalized anxiety disorder: Secondary | ICD-10-CM | POA: Diagnosis not present

## 2022-08-11 DIAGNOSIS — F331 Major depressive disorder, recurrent, moderate: Secondary | ICD-10-CM

## 2022-08-11 NOTE — Telephone Encounter (Signed)
Returning call for results. Please advise  
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
## 2022-08-11 NOTE — Telephone Encounter (Signed)
Pt made aware of test results along with MD's recommendations and verbalized understanding ECHO order placed and message sent to scheduling

## 2022-08-12 ENCOUNTER — Telehealth: Payer: Self-pay | Admitting: Internal Medicine

## 2022-08-12 ENCOUNTER — Ambulatory Visit: Payer: Medicare HMO | Attending: Internal Medicine

## 2022-08-12 DIAGNOSIS — I739 Peripheral vascular disease, unspecified: Secondary | ICD-10-CM | POA: Diagnosis not present

## 2022-08-12 LAB — VAS US LOWER EXT ART SEG MULTI (SEGMENTALS & LE RAYNAUDS)
Left ABI: 1.24
Right ABI: 1.28

## 2022-08-12 NOTE — Progress Notes (Signed)
[facility] Summary Report / Loop Recorder 

## 2022-08-12 NOTE — Telephone Encounter (Signed)
LVM to schedule echo appt 

## 2022-08-18 ENCOUNTER — Ambulatory Visit: Payer: Commercial Managed Care - HMO | Admitting: Psychology

## 2022-08-18 DIAGNOSIS — F331 Major depressive disorder, recurrent, moderate: Secondary | ICD-10-CM | POA: Diagnosis not present

## 2022-08-18 DIAGNOSIS — F411 Generalized anxiety disorder: Secondary | ICD-10-CM | POA: Diagnosis not present

## 2022-08-23 ENCOUNTER — Encounter: Payer: Self-pay | Admitting: Internal Medicine

## 2022-08-25 ENCOUNTER — Ambulatory Visit (INDEPENDENT_AMBULATORY_CARE_PROVIDER_SITE_OTHER): Payer: Medicare HMO | Admitting: Psychology

## 2022-08-25 DIAGNOSIS — F331 Major depressive disorder, recurrent, moderate: Secondary | ICD-10-CM | POA: Diagnosis not present

## 2022-08-25 DIAGNOSIS — F411 Generalized anxiety disorder: Secondary | ICD-10-CM
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
## 2022-08-30 ENCOUNTER — Ambulatory Visit (INDEPENDENT_AMBULATORY_CARE_PROVIDER_SITE_OTHER): Payer: Medicare HMO | Admitting: Behavioral Health

## 2022-08-30 ENCOUNTER — Encounter: Payer: Self-pay | Admitting: Behavioral Health

## 2022-08-30 VITALS — BP 127/78 | HR 94 | Ht 70.0 in | Wt 286.0 lb

## 2022-08-30 DIAGNOSIS — F411 Generalized anxiety disorder: Secondary | ICD-10-CM

## 2022-08-30 DIAGNOSIS — F331 Major depressive disorder, recurrent, moderate: Secondary | ICD-10-CM | POA: Diagnosis not present

## 2022-08-30 NOTE — Progress Notes (Signed)
[facility] MD/PA/NP Initial Note  08/30/2022 12:07 PM [name] [name]  MRN:  [id number]  Chief Complaint:  Chief Complaint   Anxiety; Depression; Establish Care; Patient Education     HPI:  "[name] [name]", 62 year old male presents to this office for initial visit and to establish care.  His wife is present with his verbal consent collateral information should be considered reliable.  Patient says that he experienced a series of strokes beginning in 2021.  Says that he had 5 additional strokes in 2022.  He currently ambulates using single-point cane.  He has some expressive aphasia.  Says that he was seeing Dr. Nicolasa [name] in [place] but she does not take Medicare.  Says that he is currently very stable but needing a new provider that accepts his insurance and can manage his psychotropic medications.  He is currently disabled due to the strokes.  His PHQ 2 score was negative.  Still struggles with anxiety at times.  Numerically rates his anxiety today at 3/10, and depression at 2/10.  He is sleeping 7 to 8 hours per night.  He denies any history of mania, no psychosis, no auditory or visual hallucinations.  Denies SI or HI.  Has strong family support from his wife and daughters.  Past psychiatric medication trials: Klonopin Prozac Duloxetine      Visit Diagnosis: No diagnosis found.  Past Psychiatric History: MDD, Anxiety    Past Medical History:  Past Medical History:  Diagnosis Date   Angio-edema    Anxiety    Depression    Diabetes mellitus type II    type 2   GERD (gastroesophageal reflux disease)    Headache    migraine   History of kidney stones    HLD (hyperlipidemia)    HTN (hypertension)    Morbid obesity    Paroxysmal atrial fibrillation    Restless legs syndrome (RLS) 11/20/2012   Stroke 02/14/2020   ischemic/right sided affected   Tobacco abuse    Ulcer of left ankle    last 2 months dime size dry dressing changing q day    Past Surgical History:  Procedure  Laterality Date   Arm surgery Right 1969   Abscess excision and debridement at elbow   COLONOSCOPY WITH PROPOFOL N/A 09/06/2016   Procedure: COLONOSCOPY WITH PROPOFOL;  Surgeon: [name] Stabler, MD;  Location: [facility];  Service: Gastroenterology;  Laterality: N/A;   I & D EXTREMITY Left 05/29/2014   Procedure: IRRIGATION AND DEBRIDEMENT OF LEFT LEG WOUND AND PLACEMENT OF  INTEGRA  AND  VAC;  Surgeon: Theodoro Kos, DO;  Location: [place];  Service: Plastics;  Laterality: Left;   implantable loop recorder implant  03/06/2020   Medtronic Reveal Burns model LNQ11 (SN  [id number]) implanted by Dr [name] [name] for cryptogenic stroke   INCISION AND DRAINAGE OF WOUND Left 04/23/2014   Procedure: IRRIGATION AND DEBRIDEMENT LOWER LEFT LEG WOUND WITH PLACEMENT OF INTEGRA AND VAC;  Surgeon: Theodoro Kos, DO;  Location: [place];  Service: Plastics;  Laterality: Left;   kidney stone removal  [id number]   MINOR APPLICATION OF WOUND VAC Left 04/23/2014   Procedure: MINOR APPLICATION OF WOUND VAC;  Surgeon: Theodoro Kos, DO;  Location: [place];  Service: Plastics;  Laterality: Left;   SKIN SPLIT GRAFT Left 06/18/2014   Procedure: SKIN GRAFT SPLIT THICKNESS TO LOWER LEFT LEG WOUND WITH PLACEMENT OF VAC;  Surgeon: Theodoro Kos, DO;  Location: [place];  Service: Plastics;  Laterality:  Left;   VASECTOMY  1994    Family Psychiatric History: see chart  Family History:  Family History  Problem Relation Age of Onset   Depression Mother    Anxiety disorder Mother    Stroke Mother        multiple   Leukemia Mother    Pneumonia Mother    Cancer Mother    Diabetes Mother    Diabetes Father    Heart disease Father    Hyperlipidemia Father    Hypertension Father    Allergic rhinitis Neg Hx    Angioedema Neg Hx    Asthma Neg Hx    Eczema Neg Hx    Immunodeficiency Neg Hx    Urticaria Neg Hx     Social History:  Social History    Socioeconomic History   Marital status: Married    Spouse name: Regulatory affairs officer   Number of children: 3   Years of education: Not on file   Highest education level: Not on file  Occupational History   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    Comment: no longer working 07/07/20  Tobacco Use   Smoking status: Every Day    Types: E-cigarettes    Start date: 05/05/2021   Smokeless tobacco: Never   Tobacco comments:    Not smoking currently  Vaping Use   Vaping Use: Every day   Substances: Flavoring  Substance and Sexual Activity   Alcohol use: Not Currently    Alcohol/week: 0.0 standard drinks of alcohol    Comment: rarely   Drug use: No   Sexual activity: Yes    Partners: Female  Other Topics Concern   Not on file  Social History Narrative   Lives with wife   Right Handed   Drink 6-7 cups caffeine daily   Social Determinants of Health   Financial Resource Strain: Not on file  Food Insecurity: Not on file  Transportation Needs: Not on file  Physical Activity: Not on file  Stress: Not on file  Social Connections: Not on file    Allergies:  Allergies  Allergen Reactions   Cashew Nut Oil Anaphylaxis    Cashews, 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   Pistachio Nut (Diagnostic) Hives, Shortness Of Breath, Itching, Swelling and Rash   Latex Dermatitis   Tape Other (See Comments)    Tears and bruises the skin   Betadine [Povidone Iodine] Rash and Other (See Comments)    Raised rash 04/23/14   Lidocaine Other (See Comments)    Minimal effect with lidocaine, prefers different anesthetic   Penicillins Rash and Other (See Comments)    Rash all over as a child    Metabolic Disorder Labs: Lab Results  Component Value Date   HGBA1C 6.7 (A) 06/10/2022   MPG 194.38 02/15/2020   No results found for: "PROLACTIN" Lab Results  Component Value Date   CHOL 118 07/19/2022   TRIG 186 (H) 07/19/2022   HDL 36 (L) 07/19/2022   CHOLHDL 3.3 07/19/2022   VLDL 58 (H) 02/15/2020    LDLCALC 51 07/19/2022   LDLCALC 59 07/14/2021   Lab Results  Component Value Date   TSH 0.929 07/19/2022   TSH 2.210 07/14/2021    Therapeutic Level Labs: No results found for: "LITHIUM" No results found for: "VALPROATE" No results found for: "CBMZ"  Current Medications: Current Outpatient Medications  Medication Sig Dispense Refill   acetaminophen (TYLENOL) 500 MG tablet Take 1,000 mg by mouth 2 (two) times daily as needed (for pain).  cetirizine (ZYRTEC) 10 MG tablet Take 10 mg by mouth daily.     clonazePAM (KLONOPIN) 0.5 MG tablet TAKE 1/2 TO 1 TABLET(0.25 TO 0.5 MG) BY MOUTH TWICE DAILY AS NEEDED FOR ANXIETY 60 tablet 0   diltiazem (CARDIZEM) 30 MG tablet TAKE 1 TABLET BY MOUTH EVERY 4 HOURS AS NEEDED FOR HEART RATE>100. 90 tablet 0   Dulaglutide (TRULICITY) 4.5 0000000 SOPN Inject 4.5 mg into the skin once a week. 6 mL 3   DULoxetine (CYMBALTA) 60 MG capsule Take 60 mg by mouth daily.     EPINEPHrine 0.3 mg/0.3 mL IJ SOAJ injection Inject 0.3 mg into the muscle as needed for anaphylaxis. 2 each 0   FLUoxetine (PROZAC) 40 MG capsule Take 2 capsules (80 mg total) by mouth daily. 180 capsule 1   hydrOXYzine (ATARAX) 10 MG tablet Take 10 mg by mouth 2 (two) times daily as needed.     lisinopril (ZESTRIL) 40 MG tablet TAKE 1 TABLET(40 MG) BY MOUTH DAILY 90 tablet 3   metFORMIN (GLUCOPHAGE-XR) 500 MG 24 hr tablet Take 4 tablets (2,000 mg total) by mouth daily in the afternoon. 360 tablet 3   Microlet Lancets MISC USE AS DIRECTED TWICE DAILY 100 each 5   Multiple Vitamin (MULTIVITAMIN) tablet Take 1 tablet by mouth daily.     pantoprazole (PROTONIX) 40 MG tablet TAKE 1 TABLET(40 MG) BY MOUTH DAILY 90 tablet 3   pentoxifylline (TRENTAL) 400 MG CR tablet Take 1 tablet (400 mg total) by mouth 3 (three) times daily with meals. 270 tablet 3   pregabalin (LYRICA) 300 MG capsule TAKE 1 CAPSULE(300 MG) BY MOUTH TWICE DAILY 180 capsule 3   repaglinide (PRANDIN) 0.5 MG tablet Take 1 tablet  (0.5 mg total) by mouth daily before supper. 90 tablet 1   rOPINIRole (REQUIP) 0.5 MG tablet TAKE 1 TABLET(0.5 MG) BY MOUTH TWICE DAILY 180 tablet 3   rosuvastatin (CRESTOR) 20 MG tablet TAKE 1 TABLET(20 MG) BY MOUTH AT BEDTIME 90 tablet 3   STUDY - LIBREXIA-AF - apixaban 5 mg or placebo capsule (PI-Sethi) Take 1 capsule (5 mg total) by mouth 2 (two) times daily. Take at approximately the same time of day with or without food. Please bring bottle back with you to every visit; do not discard bottle. Please contact [facility] for any questions or concerns regarding this medication. 70 capsule 0   STUDY - LIBREXIA-AF - [id number] (Milvexian) 100 mg or placebo tablet (PI-Sethi) Take 1 tablet by mouth 2 (two) times daily. Take at approximately the same time of day with or without food. Please bring bottle back with you to every visit; do not discard bottle. Please contact [facility] for any questions or concerns regarding this medication. 70 tablet 0   varenicline (CHANTIX) 1 MG tablet TAKE 1 TABLET(1 MG) BY MOUTH TWICE DAILY 60 tablet 3   ARIPiprazole (ABILIFY) 2 MG tablet Take 2 mg by mouth every morning. (Patient not taking: Reported on 08/30/2022)     dapagliflozin propanediol (FARXIGA) 10 MG TABS tablet Take 1 tablet (10 mg total) by mouth daily. (Patient not taking: Reported on 08/30/2022) 90 tablet 3   DULoxetine (CYMBALTA) 30 MG capsule Take by mouth. (Patient not taking: Reported on 08/30/2022)     glucose blood (CONTOUR NEXT TEST) test strip USE TO CHECK BLOOD SUGAR 100 strip 5   No current facility-administered medications for this visit.    Medication Side Effects: none  Orders placed this visit:  No orders of  the defined types were placed in this encounter.   Psychiatric Specialty Exam:  Review of Systems  Constitutional: Negative.   Allergic/Immunologic: Negative.   Neurological: Negative.   Psychiatric/Behavioral:  The patient is nervous/anxious.      There were no vitals taken for this visit.There is no height or weight on file to calculate BMI.  General Appearance: Casual, Neat, Well Groomed, and using single point cane  Eye Contact:  Good  Speech:  Pressured  Volume:  Normal  Mood:  NA  Affect:  Appropriate  Thought Process:  Coherent  Orientation:  Full (Time, Place, and Person)  Thought Content: Logical   Suicidal Thoughts:  No  Homicidal Thoughts:  No  Memory:  WNL  Judgement:  Good  Insight:  Good  Psychomotor Activity:  Normal  Concentration:  Concentration: Good  Recall:  Good  Fund of Knowledge: Good  Language: Good  Assets:  Desire for Improvement  ADL's:  Intact  Cognition: WNL  Prognosis:  Good   Screenings:  GAD-7    Flowsheet Row Office Visit from 07/25/2022 in [place] at [facility]  Total GAD-7 Score 14      PHQ2-9    [place] Office Visit from 08/30/2022 in [place] Office Visit from 07/25/2022 in [place] at [place] Office Visit from 01/19/2022 in [place] at [facility] Office Visit from 07/22/2021 in [place] at [facility] Office Visit from 06/22/2020 in [facility] at [place]  PHQ-2 Total Score 1 4 4 5 1   PHQ-9 Total Score -- 12 15 23 4        Receiving Psychotherapy: No   Treatment Plan/Recommendations:   Greater than 50% of 60 min face to face time with patient was spent on counseling and coordination of care. We discussed his problems with anxiety and depression that was exacerbated by his multiple strokes starting in 2021. We further discussed his poc with Dr. Nicolasa [name], and his current medication list. We discussed his current stability and goals for treatment here. He will continue to see Dennison Bulla  for therapy.   Pt will continue: Cymbalta 60 twice daily mg daily To continue Prozac 40 mg twice daily  Continue Klonopin take 1/2 to 1  tablet twice daily for severe anxiety.  Will report worsening symptoms or side effects promptly Will follow-up in 8 weeks to reassess Provided emergency contact information Discussed potential benefits, risk, and side effects of benzodiazepines to include potential risk of tolerance and dependence, as well as possible drowsiness.  Advised patient not to drive if experiencing drowsiness and to take lowest possible effective dose to minimize risk of dependence and tolerance.  Reviewed PDMP  Elwanda Brooklyn, NP

## 2022-08-31 ENCOUNTER — Ambulatory Visit: Payer: Medicare HMO | Admitting: Podiatry

## 2022-09-01 ENCOUNTER — Ambulatory Visit: Payer: Medicare HMO | Admitting: Psychology

## 2022-09-01 DIAGNOSIS — F411 Generalized anxiety disorder: Secondary | ICD-10-CM

## 2022-09-01 DIAGNOSIS — F331 Major depressive disorder, recurrent, moderate: Secondary | ICD-10-CM

## 2022-09-01 NOTE — Progress Notes (Signed)
[facility] Counselor/Therapist Progress Note  Patient ID: [name] [name], MRN: [id number],    Date: 09/01/2022  Time Spent: 45 mins  Treatment Type: Individual Therapy  Reported Symptoms: Pt presents for session, via WebEx video.  Pt grants consent for the session, stating his is in his home with no one else present.  I shared with pt that I am in my office with no one else here either.   Mental Status Exam: Appearance:  Casual     Behavior: Appropriate  Motor: Normal  Speech/Language:  Clear and Coherent  Affect: Depressed and Flat  Mood: depressed  Thought process: normal  Thought content:   WNL  Sensory/Perceptual disturbances:   WNL  Orientation: oriented to person, place, and situation  Attention: Good  Concentration: Fair  Memory: Recent;   [place] of knowledge:  Good  Insight:   Good  Judgment:  Good  Impulse Control: Good   Risk Assessment: Danger to Self:  No Self-injurious Behavior: No Danger to Others: No Duty to Warn:no Physical Aggression / Violence:No  Access to Firearms a concern: No  Gang Involvement:No   Subjective: Pt shares that he "has been OK since our last session.  I went to see Lesle Chris at [facility] and that visit went well.  He wants to see my back in 2 months."  Pt shares that he and [name] felt good about that visit and felt that Aaron Edelman was a good fit for pt.  Aaron Edelman left all of his medications and dosages the same at this point, since pt is doing well at this point.  Pt shares that he continues to sleep well on most nights.  Pt again shares that his life changed significantly with the passing of his parents (12 yrs ago for his dad and 14 yrs ago for his mom).  He had to care for his dad each day before he went to work.  Pt goes back to when he and his mom and dad moved to [place], [place] to be near his grandmother after his grandfather died.  His parents lived there until they both passed.  "I hated living there because the kids there  were not nice to me; those kids had grown up together and I was an outsider.  They bullied me a lot too."  Pt shares that his parents did not come to his HS graduation and that was impactful for pt.  Pt shares that he "enjoyed band in HS and I excelled in band."  Pt played the clarinet.  Pt shares that he got several awards in [employer] and regional competitions.  Pt met [name] when he was living and working in [place], [place].  They met at church.  They got married in 1986; they moved back to [place] and lived there for 13 yrs until [name] wanted to move back to [place] in 1999.  They lived there until [place] came in 2005 and wiped them out.  Pt shares that he has not taken any Klonopin since last week's session.  Encouraged pt to continue with his self care activities and we will meet in 2 weeks for a follow up session, due to my vacation next week.  Interventions: Cognitive Behavioral Therapy  Diagnosis:Moderate episode of recurrent major depressive disorder  Generalized anxiety disorder  Plan: Treatment Plan Strengths/Abilities:  Intelligent, Intuitive, Willing to participate in therapy Treatment Preferences:  Outpatient Individual Therapy Statement of Needs:  Patient is to use CBT, mindfulness and coping skills to help manage and/or  decrease symptoms associated with their diagnosis. Symptoms:  Depressed/Irritable mood, worry, social withdrawal Problems Addressed:  Depressive thoughts, Sadness, Sleep issues, etc. Long Term Goals:  Pt to reduce overall level, frequency, and intensity of the feelings of depression/anxiety as evidenced by decreased irritability, negative self talk, and helpless feelings from 6 to 7 days/week to 0 to 1 days/week, per client report, for at least 3 consecutive months.  Progress: 20% Short Term Goals:  Pt to verbally express understanding of the relationship between feelings of depression/anxiety and their impact on thinking patterns and behaviors.  Pt to verbalize an understanding of  the role that distorted thinking plays in creating fears, excessive worry, and ruminations.  Progress: 20% Target Date:  06/03/2023 Frequency:  Bi-weekly Modality:  Cognitive Behavioral Therapy Interventions by Therapist:  Therapist will use CBT, Mindfulness exercises, Coping skills and Referrals, as needed by client. Client has verbally approved this treatment plan.  [name], [facility]

## 2022-09-05 ENCOUNTER — Ambulatory Visit (INDEPENDENT_AMBULATORY_CARE_PROVIDER_SITE_OTHER): Payer: Medicare HMO

## 2022-09-05 DIAGNOSIS — I639 Cerebral infarction, unspecified: Secondary | ICD-10-CM

## 2022-09-05 LAB — CUP PACEART REMOTE DEVICE CHECK
Date Time Interrogation Session: 20240407230615
Implantable Pulse Generator Implant Date: 20211008

## 2022-09-07 ENCOUNTER — Encounter: Payer: Self-pay | Admitting: Podiatry

## 2022-09-07 ENCOUNTER — Ambulatory Visit (INDEPENDENT_AMBULATORY_CARE_PROVIDER_SITE_OTHER): Payer: Medicare HMO | Admitting: Podiatry

## 2022-09-07 DIAGNOSIS — M79676 Pain in unspecified toe(s): Secondary | ICD-10-CM

## 2022-09-07 DIAGNOSIS — B351 Tinea unguium: Secondary | ICD-10-CM | POA: Diagnosis not present

## 2022-09-07 DIAGNOSIS — Z8631 Personal history of diabetic foot ulcer: Secondary | ICD-10-CM | POA: Diagnosis not present

## 2022-09-07 DIAGNOSIS — E11610 Type 2 diabetes mellitus with diabetic neuropathic arthropathy: Secondary | ICD-10-CM

## 2022-09-07 DIAGNOSIS — E1142 Type 2 diabetes mellitus with diabetic polyneuropathy: Secondary | ICD-10-CM | POA: Diagnosis not present

## 2022-09-07 DIAGNOSIS — E1159 Type 2 diabetes mellitus with other circulatory complications: Secondary | ICD-10-CM

## 2022-09-07 DIAGNOSIS — M2041 Other hammer toe(s) (acquired), right foot: Secondary | ICD-10-CM

## 2022-09-07 DIAGNOSIS — M2042 Other hammer toe(s) (acquired), left foot: Secondary | ICD-10-CM

## 2022-09-07 NOTE — Progress Notes (Addendum)
  Subjective:  Patient ID: [name] [name], male    DOB: [birth year],  MRN: [id number]  [name] [name] presents to clinic today for at risk foot care with history of diabetic neuropathy and painful thick toenails that are difficult to trim. Pain interferes with ambulation. Aggravating factors include wearing enclosed shoe gear. Pain is relieved with periodic professional debridement.  Chief Complaint  Patient presents with   Diabetes    DFC BS - 118 A1C - [facility] - 06/2022   New problem(s):  Patient states he stubbed his right 4th toe and it is bruised. States it is bruised, but he is still able to move it.  PCP is Copland, Karleen Hampshire, MD.  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   Pistachio Nut (Diagnostic) Hives, Shortness Of Breath, Itching, Swelling and Rash   Latex Dermatitis   Tape Other (See Comments)    Tears and bruises the skin   Betadine [Povidone Iodine] Rash and Other (See Comments)    Raised rash 04/23/14   Lidocaine Other (See Comments)    Minimal effect with lidocaine, prefers different anesthetic   Penicillins Rash and Other (See Comments)    Rash all over as a child    Review of Systems: Negative except as noted in the HPI.  Objective: No changes noted in today's physical examination. There were no vitals filed for this visit. [name] [name] is a pleasant 62 y.o. male morbidly obese in NAD. AAO x 3.  Vascular Examination: Vascular status intact b/l with palpable pedal pulses. Pedal hair present b/l. CFT immediate b/l. No edema. No pain with calf compression b/l. Skin temperature gradient WNL b/l. Evidence of chronic venous insufficiency b/l LE.  Neurological Examination: Pt has subjective symptoms of neuropathy. Protective sensation diminished with 10g monofilament b/l. Vibratory sensation diminished b/l.  Dermatological Examination: Pedal skin is warm and supple b/l LE. Scarring noted. No open wounds b/l  LE. No interdigital macerations noted b/l LE. Toenails 1-5 bilaterally elongated, discolored, dystrophic, thickened, and crumbly with subungual debris and tenderness to dorsal palpation.   Ecchymosis dorsal aspect right 4th digit resovling.  Musculoskeletal Examination: Muscle strength 5/5 to b/l LE. Hammertoe deformity noted 2-5 b/l. Charcot deformity noted right foot. Patient ambulates with cane assistance.  He is able to elicit passive and active ROM right 4th digit.  Radiographs: None  Assessment/Plan: 1. Pain due to onychomycosis of toenail   2. Personal history of diabetic foot ulcer   3. Hammertoe, bilateral   4. Diabetic Charcot's foot   5. Type 2 diabetes mellitus with vascular disease   6. Diabetic polyneuropathy associated with type 2 diabetes mellitus     Orders Placed This Encounter  Procedures   For Home Use Only DME Diabetic Shoe    Dispense one pair of extra depth shoes with stretchable uppers for hammertoe deformity bilaterally. Patient has history of diabetic foot ulcer.    -Patient was evaluated and treated. All patient's and/or POA's questions/concerns answered on today's visit. -Patient stubbed right 4th toe. Stable. Monitor. -Patient to continue soft, supportive shoe gear daily. Start procedure for diabetic shoes. Patient qualifies based on diagnoses. -Toenails 1-5 b/l were debrided in length and girth with sterile nail nippers and dremel without iatrogenic bleeding.  -Patient/POA to call should there be question/concern in the interim.   Return in about 3 months (around 12/07/2022).  Freddie Breech, DPM

## 2022-09-09 ENCOUNTER — Telehealth: Payer: Self-pay | Admitting: Podiatry

## 2022-09-09 NOTE — Telephone Encounter (Signed)
Received fax no authorization required from [facility] for diabetic shoes  CDR [id number]- Documentation will be sent to the scan center.

## 2022-09-09 NOTE — Progress Notes (Signed)
[facility] Summary Report / Loop Recorder 

## 2022-09-14 ENCOUNTER — Ambulatory Visit: Payer: Commercial Managed Care - HMO | Admitting: Internal Medicine

## 2022-09-15 ENCOUNTER — Ambulatory Visit (INDEPENDENT_AMBULATORY_CARE_PROVIDER_SITE_OTHER): Payer: Medicare HMO | Admitting: Psychology

## 2022-09-15 DIAGNOSIS — F411 Generalized anxiety disorder: Secondary | ICD-10-CM

## 2022-09-15 DIAGNOSIS — F331 Major depressive disorder, recurrent, moderate: Secondary | ICD-10-CM | POA: Diagnosis not present
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
## 2022-09-20 ENCOUNTER — Ambulatory Visit: Payer: Medicare HMO | Attending: Internal Medicine

## 2022-09-20 DIAGNOSIS — I480 Paroxysmal atrial fibrillation: Secondary | ICD-10-CM

## 2022-09-20 LAB — ECHOCARDIOGRAM COMPLETE
AR max vel: 5.62 cm2
AV Area VTI: 5.71 cm2
AV Area mean vel: 5.52 cm2
AV Mean grad: 3 mmHg
AV Peak grad: 5.7 mmHg
Ao pk vel: 1.2 m/s
Area-P 1/2: 3.5 cm2
S' Lateral: 3.6 cm

## 2022-09-20 MED ORDER — PERFLUTREN LIPID MICROSPHERE
1.0000 mL | INTRAVENOUS | Status: AC | PRN
  Administered 2022-09-20: 2 mL via INTRAVENOUS

## 2022-09-21 ENCOUNTER — Ambulatory Visit: Payer: Medicare HMO

## 2022-09-21 DIAGNOSIS — M2041 Other hammer toe(s) (acquired), right foot: Secondary | ICD-10-CM

## 2022-09-21 DIAGNOSIS — E11610 Type 2 diabetes mellitus with diabetic neuropathic arthropathy: Secondary | ICD-10-CM

## 2022-09-21 DIAGNOSIS — E1159 Type 2 diabetes mellitus with other circulatory complications: Secondary | ICD-10-CM

## 2022-09-21 DIAGNOSIS — Z8631 Personal history of diabetic foot ulcer: Secondary | ICD-10-CM

## 2022-09-21 NOTE — Progress Notes (Unsigned)
Patient presents to the office today for diabetic shoe and insole measuring.  ABN signed.   Documentation of medical necessity will be sent to patient's treating diabetic doctor to verify and sign.   Patient's diabetic provider: [facility]  Shoes and insoles will be ordered at that time and patient will be notified for an appointment for fitting when they arrive.   Patient shoe selection-   1st   Shoe choice:   [id number]  Shoe size ordered: 13XW

## 2022-09-22 ENCOUNTER — Ambulatory Visit: Payer: Medicare HMO | Admitting: Psychology

## 2022-09-29 ENCOUNTER — Ambulatory Visit: Payer: Medicare HMO | Admitting: Psychology

## 2022-10-06 ENCOUNTER — Ambulatory Visit: Payer: Medicare HMO | Admitting: Psychology

## 2022-10-10 ENCOUNTER — Ambulatory Visit: Payer: Medicare HMO

## 2022-10-10 DIAGNOSIS — I639 Cerebral infarction, unspecified: Secondary | ICD-10-CM

## 2022-10-11 ENCOUNTER — Other Ambulatory Visit (HOSPITAL_COMMUNITY): Payer: Self-pay

## 2022-10-11 LAB — CUP PACEART REMOTE DEVICE CHECK
Date Time Interrogation Session: 20240510230444
Implantable Pulse Generator Implant Date: 20211008

## 2022-10-11 MED ORDER — STUDY - LIBREXIA-AF - JNJ-70033093 (MILVEXIAN) 100 MG OR PLACEBO (PI-SETHI): 1.0000 | ORAL_TABLET | Freq: Two times a day (BID) | ORAL | 0 refills | Status: DC

## 2022-10-11 MED ORDER — STUDY - LIBREXIA-AF - APIXABAN 5 MG OR PLACEBO CAPSULE (PI-SETHI): 5.0000 mg | ORAL_CAPSULE | Freq: Two times a day (BID) | ORAL | 0 refills | Status: DC

## 2022-10-11 NOTE — Progress Notes (Signed)
    [name] Angelino T. Lilliann Rossetti, MD, CAQ Sports Medicine [facility] [address] [place], [address]  Phone: [phone]  FAX: [phone]  [name] [name] - 62 y.o. male  MRN [id number]  Date of Birth: [birth year]  Date: 10/12/2022  PCP: Hannah Beat, MD  Referral: Hannah Beat, MD  No chief complaint on file.  Subjective:   [name] [name] is a 62 y.o. very pleasant male patient with There is no height or weight on file to calculate BMI. who presents with the following:  Patient is here to follow-up regarding some blood pressure concerns.  He recently stopped [place] due to cost, and since then he has had some fluctuating blood pressures and he has not been feeling all that well.  Diabetes is managed by endocrinology, Dr. Lonzo Cloud    Review of Systems is noted in the HPI, as appropriate  Objective:   There were no vitals taken for this visit.  GEN: No acute distress; alert,appropriate. PULM: Breathing comfortably in no respiratory distress PSYCH: Normally interactive.   Laboratory and Imaging Data:  Assessment and Plan:   ***

## 2022-10-12 ENCOUNTER — Encounter: Payer: Self-pay | Admitting: Family Medicine

## 2022-10-12 ENCOUNTER — Ambulatory Visit (INDEPENDENT_AMBULATORY_CARE_PROVIDER_SITE_OTHER): Payer: Medicare HMO | Admitting: Family Medicine

## 2022-10-12 VITALS — BP 118/66 | HR 80 | Temp 97.0°F | Ht 69.5 in | Wt 293.1 lb

## 2022-10-12 DIAGNOSIS — E1159 Type 2 diabetes mellitus with other circulatory complications: Secondary | ICD-10-CM | POA: Diagnosis not present

## 2022-10-12 DIAGNOSIS — Z7984 Long term (current) use of oral hypoglycemic drugs: Secondary | ICD-10-CM | POA: Diagnosis not present

## 2022-10-12 DIAGNOSIS — I10 Essential (primary) hypertension: Secondary | ICD-10-CM | POA: Diagnosis not present

## 2022-10-12 MED ORDER — HYDROCHLOROTHIAZIDE 12.5 MG PO TABS: 12.5000 mg | ORAL_TABLET | Freq: Every day | ORAL | 1 refills | Status: DC

## 2022-10-12 NOTE — Patient Instructions (Signed)
Switch your lisinopril 40 mg dose to a nighttime dose  Start the hydrochlorothiazide 12.5 mg each morning.  Check BP twice a day for the next 2 weeks, and send me those numbers through mychart.

## 2022-10-12 NOTE — Progress Notes (Signed)
[facility] Summary Report / Loop Recorder 

## 2022-10-13 ENCOUNTER — Ambulatory Visit: Payer: Medicare HMO | Admitting: Psychology

## 2022-10-13 DIAGNOSIS — F331 Major depressive disorder, recurrent, moderate: Secondary | ICD-10-CM

## 2022-10-13 DIAGNOSIS — F411 Generalized anxiety disorder: Secondary | ICD-10-CM | POA: Diagnosis not present
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
## 2022-10-20 ENCOUNTER — Ambulatory Visit (INDEPENDENT_AMBULATORY_CARE_PROVIDER_SITE_OTHER): Payer: Medicare HMO | Admitting: Psychology

## 2022-10-20 DIAGNOSIS — F411 Generalized anxiety disorder: Secondary | ICD-10-CM

## 2022-10-20 DIAGNOSIS — F331 Major depressive disorder, recurrent, moderate: Secondary | ICD-10-CM | POA: Diagnosis not present

## 2022-10-20 NOTE — Progress Notes (Signed)
[facility] Counselor/Therapist Progress Note  Patient ID: [name] [name], MRN: [id number],    Date: 10/20/2022  Time Spent: 45 mins  Treatment Type: Individual Therapy  Reported Symptoms: Pt presents for session, via Caregility video.  Pt grants consent for the session, stating his is in his home with no one else present.  I shared with pt that I am in my office with no one else here either.   Mental Status Exam: Appearance:  Casual     Behavior: Appropriate  Motor: Normal  Speech/Language:  Clear and Coherent  Affect: Depressed and Flat  Mood: depressed  Thought process: normal  Thought content:   WNL  Sensory/Perceptual disturbances:   WNL  Orientation: oriented to person, place, and situation  Attention: Good  Concentration: Fair  Memory: Recent;   Fair  Fund of knowledge:  Good  Insight:   Good  Judgment:  Good  Impulse Control: Good   Risk Assessment: Danger to Self:  No Self-injurious Behavior: No Danger to Others: No Duty to Warn:no Physical Aggression / Violence:No  Access to Firearms a concern: No  Gang Involvement:No   Subjective: Pt shares that "I am alright; I am still struggling with medication changes and it is hard.  I wake every morning with a headache and that is not fun."  Pt shares that he was given a diruetic and it has not been working very well.  [name] and his daughter Banker) are taking his BP regularly and it is pretty high, especially in the morning.  This morning his BP was 158/98 2 hrs after he took his medication.  He saw his PCP last week and that is when he added the diruetic and change his BP medication.  "I don't feel good."  Pt shares that he has an appt with his cardiologist (Dr. Hilton Sinclair) tomorrow (second visit).  Pt shares the likes his PCP (Dr. Karleen [facility]) and his PCP has known him and treated him for 15+ yrs.  Pt shares that his sinus issues have been coming and going due to pollen.  His stomach  issues are resolved.  Pt shares that [name] is still struggling with her back; she has not yet had the back injection but it is now scheduled for next Friday.  Pt shares that he has been worrying about [name] and that has been increasing his anxiety.  He is doing some of his self care activities (puzzles, music, coloring, etc.).  [name] started her new job yesterday and she had a good day.  Pt shares that he is still sleeping well; since [name]'s back has been bothering her, they have been taking a nap in the afternoons and pt is enjoying that; it is not impacting his ability to fall asleep at night.  Pt shares that he did get pension paperwork from [employer] this past week and he has submitted that paperwork back to them.  Encouraged pt to continue with his self care activities and we will meet next week for a follow up session.  Interventions: Cognitive Behavioral Therapy  Diagnosis:Moderate episode of recurrent major depressive disorder (HCC)  Generalized anxiety disorder  Plan: Treatment Plan Strengths/Abilities:  Intelligent, Intuitive, Willing to participate in therapy Treatment Preferences:  Outpatient Individual Therapy Statement of Needs:  Patient is to use CBT, mindfulness and coping skills to help manage and/or decrease symptoms associated with their diagnosis. Symptoms:  Depressed/Irritable mood, worry, social withdrawal Problems Addressed:  Depressive thoughts, Sadness, Sleep issues, etc. Long Term Goals:  Pt  to reduce overall level, frequency, and intensity of the feelings of depression/anxiety as evidenced by decreased irritability, negative self talk, and helpless feelings from 6 to 7 days/week to 0 to 1 days/week, per client report, for at least 3 consecutive months.  Progress: 20% Short Term Goals:  Pt to verbally express understanding of the relationship between feelings of depression/anxiety and their impact on thinking patterns and behaviors.  Pt to verbalize an understanding of the role  that distorted thinking plays in creating fears, excessive worry, and ruminations.  Progress: 20% Target Date:  06/03/2023 Frequency:  Bi-weekly Modality:  Cognitive Behavioral Therapy Interventions by Therapist:  Therapist will use CBT, Mindfulness exercises, Coping skills and Referrals, as needed by client. Client has verbally approved this treatment plan.  Karie Kirks, [facility]

## 2022-10-21 ENCOUNTER — Ambulatory Visit: Payer: Medicare HMO | Admitting: Internal Medicine

## 2022-10-21 NOTE — Progress Notes (Deleted)
Follow-up Outpatient Visit Date: 10/21/2022  Primary Care Provider: Hannah Beat, MD [address] [place] [address]  Chief Complaint: ***  HPI:  [name] [name] is a 62 y.o. male with history of paroxysmal atrial fibrillation, peripheral arterial disease, hypertension, hyperlipidemia, type 2 diabetes mellitus, and stroke in 01/2020, who presents for follow-up of paroxysmal atrial fibrillation.  I met him in December, at which time he had multiple concerns including intermittent chest pain, palpitations, and chronic leg pain.  ILR had not shown any recent episodes of atrial fibrillation.  He was referred for myocardial PET/CT stress test, which showed normal perfusion and mildly reduced LVEF.  Subsequent echo demonstrated normal LVEF.  ABI's were normal bilaterally.  Left lower extremity arterial Duplex showed little to no plaque.  --------------------------------------------------------------------------------------------------  Past Medical History:  Diagnosis Date   Angio-edema    Anxiety    Depression    Diabetes mellitus type II    type 2   GERD (gastroesophageal reflux disease)    Headache    migraine   History of kidney stones    HLD (hyperlipidemia)    HTN (hypertension)    Morbid obesity (HCC)    Paroxysmal atrial fibrillation (HCC)    Restless legs syndrome (RLS) 11/20/2012   Stroke (HCC) 02/14/2020   ischemic/right sided affected   Tobacco abuse    Ulcer of left ankle (HCC)    last 2 months dime size dry dressing changing q day   Past Surgical History:  Procedure Laterality Date   Arm surgery Right 1969   Abscess excision and debridement at elbow   COLONOSCOPY WITH PROPOFOL N/A 09/06/2016   Procedure: COLONOSCOPY WITH PROPOFOL;  Surgeon: Sherrilyn Rist, MD;  Location: [facility];  Service: Gastroenterology;  Laterality: N/A;   I & D EXTREMITY Left 05/29/2014   Procedure: IRRIGATION AND DEBRIDEMENT OF LEFT LEG WOUND AND PLACEMENT OF  INTEGRA  AND   VAC;  Surgeon: Wayland Denis, DO;  Location: [facility];  Service: Plastics;  Laterality: Left;   implantable loop recorder implant  03/06/2020   Medtronic Reveal McIntosh model LNQ11 (SN  [id number]) implanted by Dr Johney Frame for cryptogenic stroke   INCISION AND DRAINAGE OF WOUND Left 04/23/2014   Procedure: IRRIGATION AND DEBRIDEMENT LOWER LEFT LEG WOUND WITH PLACEMENT OF INTEGRA AND VAC;  Surgeon: Wayland Denis, DO;  Location: [facility];  Service: Plastics;  Laterality: Left;   kidney stone removal  2004   MINOR APPLICATION OF WOUND VAC Left 04/23/2014   Procedure: MINOR APPLICATION OF WOUND VAC;  Surgeon: Wayland Denis, DO;  Location: [facility];  Service: Plastics;  Laterality: Left;   SKIN SPLIT GRAFT Left 06/18/2014   Procedure: SKIN GRAFT SPLIT THICKNESS TO LOWER LEFT LEG WOUND WITH PLACEMENT OF VAC;  Surgeon: Wayland Denis, DO;  Location: [facility];  Service: Plastics;  Laterality: Left;   VASECTOMY  1994    No outpatient medications have been marked as taking for the 10/21/22 encounter (Appointment) with Patrycja Mumpower, Cristal Deer, MD.    Allergies: Cashew nut oil, Pistachio nut (diagnostic), Latex, Tape, Betadine [povidone iodine], Lidocaine, and Penicillins  Social History   Tobacco Use   Smoking status: Every Day    Types: E-cigarettes    Start date: 05/05/2021   Smokeless tobacco: Never   Tobacco comments:    Not smoking currently  Vaping Use   Vaping Use: Every day   Substances: Flavoring  Substance Use Topics   Alcohol  use: Not Currently    Alcohol/week: 0.0 standard drinks of alcohol    Comment: rarely   Drug use: No    Family History  Problem Relation Age of Onset   Depression Mother    Anxiety disorder Mother    Stroke Mother        multiple   Leukemia Mother    Pneumonia Mother    Cancer Mother    Diabetes Mother    Diabetes Father    Heart disease Father    Hyperlipidemia Father    Hypertension Father     Allergic rhinitis Neg Hx    Angioedema Neg Hx    Asthma Neg Hx    Eczema Neg Hx    Immunodeficiency Neg Hx    Urticaria Neg Hx     Review of Systems: A 12-system review of systems was performed and was negative except as noted in the HPI.  --------------------------------------------------------------------------------------------------  Physical Exam: There were no vitals taken for this visit.  General:  NAD. Neck: No JVD or HJR. Lungs: Clear to auscultation bilaterally without wheezes or crackles. Heart: Regular rate and rhythm without murmurs, rubs, or gallops. Abdomen: Soft, nontender, nondistended. Extremities: No lower extremity edema.  EKG:  ***  Lab Results  Component Value Date   WBC 7.2 07/19/2022   HGB 16.1 07/19/2022   HCT 46.1 07/19/2022   MCV 85 07/19/2022   PLT 182 07/19/2022    Lab Results  Component Value Date   NA 144 07/19/2022   K 4.5 07/19/2022   CL 106 07/19/2022   CO2 22 07/19/2022   BUN 20 07/19/2022   CREATININE 0.98 07/19/2022   GLUCOSE 136 (H) 07/19/2022   ALT 35 07/19/2022    Lab Results  Component Value Date   CHOL 118 07/19/2022   HDL 36 (L) 07/19/2022   LDLCALC 51 07/19/2022   LDLDIRECT 76 02/19/2014   TRIG 186 (H) 07/19/2022   CHOLHDL 3.3 07/19/2022    --------------------------------------------------------------------------------------------------  ASSESSMENT AND PLAN: Cristal Deer Kayn Haymore, MD 10/21/2022 7:27 AM

## 2022-10-25 ENCOUNTER — Encounter: Payer: Self-pay | Admitting: Family Medicine

## 2022-10-25 MED ORDER — HYDROCHLOROTHIAZIDE 25 MG PO TABS: 25.0000 mg | ORAL_TABLET | Freq: Every day | ORAL | 3 refills | Status: DC

## 2022-10-27 ENCOUNTER — Ambulatory Visit (INDEPENDENT_AMBULATORY_CARE_PROVIDER_SITE_OTHER): Payer: Medicare HMO | Admitting: Psychology

## 2022-10-27 ENCOUNTER — Other Ambulatory Visit: Payer: Self-pay

## 2022-10-27 DIAGNOSIS — F411 Generalized anxiety disorder: Secondary | ICD-10-CM | POA: Diagnosis not present

## 2022-10-27 DIAGNOSIS — F331 Major depressive disorder, recurrent, moderate: Secondary | ICD-10-CM | POA: Diagnosis not present

## 2022-10-27 MED ORDER — REPAGLINIDE 0.5 MG PO TABS: 0.5000 mg | ORAL_TABLET | Freq: Every day | ORAL | 1 refills | Status: DC
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
## 2022-10-28 ENCOUNTER — Encounter: Payer: Self-pay | Admitting: Internal Medicine

## 2022-10-28 ENCOUNTER — Ambulatory Visit: Payer: Medicare HMO | Admitting: Internal Medicine

## 2022-10-28 VITALS — BP 124/72 | HR 72 | Ht 69.5 in | Wt 302.0 lb

## 2022-10-28 DIAGNOSIS — E1142 Type 2 diabetes mellitus with diabetic polyneuropathy: Secondary | ICD-10-CM | POA: Diagnosis not present

## 2022-10-28 DIAGNOSIS — Z7984 Long term (current) use of oral hypoglycemic drugs: Secondary | ICD-10-CM

## 2022-10-28 DIAGNOSIS — E1159 Type 2 diabetes mellitus with other circulatory complications: Secondary | ICD-10-CM

## 2022-10-28 DIAGNOSIS — Z7985 Long-term (current) use of injectable non-insulin antidiabetic drugs: Secondary | ICD-10-CM | POA: Diagnosis not present

## 2022-10-28 LAB — POCT GLYCOSYLATED HEMOGLOBIN (HGB A1C): Hemoglobin A1C: 6.6 % — AB (ref 4.0–5.6)

## 2022-10-28 LAB — POCT GLUCOSE (DEVICE FOR HOME USE): Glucose Fasting, POC: 137 mg/dL — AB (ref 70–99)

## 2022-10-28 MED ORDER — EMPAGLIFLOZIN 25 MG PO TABS: 25.0000 mg | ORAL_TABLET | Freq: Every day | ORAL | 3 refills | Status: DC

## 2022-10-28 NOTE — Progress Notes (Signed)
Name: [name] [name]  Age/ Sex: 62 y.o., male   MRN/ DOB: [id number], Oct 07, 1960     PCP: Hannah Beat, MD   Reason for Endocrinology Evaluation: Type 2 Diabetes Mellitus  Initial Endocrine Consultative Visit: 03/01/2019    PATIENT IDENTIFIER: [name] [name] is a 62 y.o. male with a past medical history of HTN, T2DM,Hx CVA. The patient has followed with Endocrinology clinic since 03/01/2019 for consultative assistance with management of his diabetes.  DIABETIC HISTORY:  [name] [name] was diagnosed with DM 2010, patient intolerant to bromocriptine. His hemoglobin A1c has ranged from 5.9% in 2022, peaking at 10.7% in 2020.   The patient has been followed up with Dr. Everardo All between 2020 until April 2023  Stopped [name] 07/2022 due to cost issues.  Patient did not qualify for patient assistance  SUBJECTIVE:   During the last visit (03/18/2022): A1c 6.7%     Today (10/28/2022): [name] [name] is here for follow-up on diabetes management. He is accompanied by his spouse today. He checks his blood sugars 1 times daily. The patient has not had hypoglycemic episodes since the last clinic visit.  No glucose meter.   Patient follows with behavioral health for depression Was seen by podiatry 08/2022 Patient had a follow-up with neurology 07/2022  Switched insurance to [facility] in March, 2024, [name] is cost him $500  Denies diarrhea or constipation  Has nausea the first day  after trulicity, he is also having difficulty obtaining Trulicity due to shortage of supply at times   HOME DIABETES REGIMEN:  Metformin 500 mg XR 4 tabs daily  Repaglinide 0.5 mg , 1 tab before supper Trulicity 4.5 mg weekly( Saturday)    Statin: Yes ACE-I/ARB: Yes   METER DOWNLOAD SUMMARY: did not bring     DIABETIC COMPLICATIONS: Microvascular complications:  Neuropathy Denies: CKD Last Eye Exam: Completed 03/09/2021  Macrovascular complications:  CVA Denies: CAD, PVD   HISTORY:  Past  Medical History:  Past Medical History:  Diagnosis Date   Angio-edema    Anxiety    Depression    Diabetes mellitus type II    type 2   GERD (gastroesophageal reflux disease)    Headache    migraine   History of kidney stones    HLD (hyperlipidemia)    HTN (hypertension)    Morbid obesity (HCC)    Paroxysmal atrial fibrillation (HCC)    Restless legs syndrome (RLS) 11/20/2012   Stroke (HCC) 02/14/2020   ischemic/right sided affected   Tobacco abuse    Ulcer of left ankle (HCC)    last 2 months dime size dry dressing changing q day   Past Surgical History:  Past Surgical History:  Procedure Laterality Date   Arm surgery Right 1969   Abscess excision and debridement at elbow   COLONOSCOPY WITH PROPOFOL N/A 09/06/2016   Procedure: COLONOSCOPY WITH PROPOFOL;  Surgeon: Sherrilyn Rist, MD;  Location: [facility];  Service: Gastroenterology;  Laterality: N/A;   I & D EXTREMITY Left 05/29/2014   Procedure: IRRIGATION AND DEBRIDEMENT OF LEFT LEG WOUND AND PLACEMENT OF  INTEGRA  AND  VAC;  Surgeon: Wayland Denis, DO;  Location: [facility];  Service: Plastics;  Laterality: Left;   implantable loop recorder implant  03/06/2020   Medtronic Reveal [place] model LNQ11 (SN  [id number]) implanted by Dr Johney Frame for cryptogenic stroke   INCISION AND DRAINAGE OF WOUND Left 04/23/2014   Procedure: IRRIGATION AND DEBRIDEMENT LOWER LEFT LEG WOUND WITH PLACEMENT OF INTEGRA AND VAC;  Surgeon: Wayland Denis, DO;  Location: [facility];  Service: Plastics;  Laterality: Left;   kidney stone removal  2004   MINOR APPLICATION OF WOUND VAC Left 04/23/2014   Procedure: MINOR APPLICATION OF WOUND VAC;  Surgeon: Wayland Denis, DO;  Location: [facility];  Service: Plastics;  Laterality: Left;   SKIN SPLIT GRAFT Left 06/18/2014   Procedure: SKIN GRAFT SPLIT THICKNESS TO LOWER LEFT LEG WOUND WITH PLACEMENT OF VAC;  Surgeon: Wayland Denis, DO;  Location: [facility];  Service: Plastics;  Laterality: Left;   VASECTOMY  1994   Social History:  reports that he has been smoking e-cigarettes. He started smoking about 17 months ago. He has never used smokeless tobacco. He reports that he does not currently use alcohol. He reports that he does not use drugs. Family History:  Family History  Problem Relation Age of Onset   Depression Mother    Anxiety disorder Mother    Stroke Mother        multiple   Leukemia Mother    Pneumonia Mother    Cancer Mother    Diabetes Mother    Diabetes Father    Heart disease Father    Hyperlipidemia Father    Hypertension Father    Allergic rhinitis Neg Hx    Angioedema Neg Hx    Asthma Neg Hx    Eczema Neg Hx    Immunodeficiency Neg Hx    Urticaria Neg Hx      HOME MEDICATIONS: Allergies as of 10/28/2022       Reactions   Cashew Nut Oil Anaphylaxis   Cashews, 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   Pistachio Nut (diagnostic) Hives, Shortness Of Breath, Itching, Swelling, Rash   Latex Dermatitis   Tape Other (See Comments)   Tears and bruises the skin   Betadine [povidone Iodine] Rash, Other (See Comments)   Raised rash 04/23/14   Lidocaine Other (See Comments)   Minimal effect with lidocaine, prefers different anesthetic   Penicillins Rash, Other (See Comments)   Rash all over as a child        Medication List        Accurate as of Oct 28, 2022 12:11 PM. If you have any questions, ask your nurse or doctor.          acetaminophen 500 MG tablet Commonly known as: TYLENOL Take 1,000 mg by mouth 2 (two) times daily as needed (for pain).   cetirizine 10 MG tablet Commonly known as: ZYRTEC Take 10 mg by mouth daily.   clonazePAM 0.5 MG tablet Commonly known as: KLONOPIN TAKE 1/2 TO 1 TABLET(0.25 TO 0.5 MG) BY MOUTH TWICE DAILY AS NEEDED FOR ANXIETY   Contour Next Test test strip Generic drug: glucose blood USE TO CHECK BLOOD SUGAR   diltiazem 30 MG tablet Commonly known as: CARDIZEM TAKE 1  TABLET BY MOUTH EVERY 4 HOURS AS NEEDED FOR HEART RATE>100.   DULoxetine 60 MG capsule Commonly known as: CYMBALTA Take 60 mg by mouth 2 (two) times daily.   EPINEPHrine 0.3 mg/0.3 mL Soaj injection Commonly known as: EPI-PEN Inject 0.3 mg into the muscle as needed for anaphylaxis.   FLUoxetine 40 MG capsule Commonly known as: PROZAC Take 2 capsules (80 mg total) by mouth daily.   hydrochlorothiazide 25 MG tablet Commonly known as: HYDRODIURIL Take 1 tablet (25 mg total) by mouth daily.   hydrOXYzine 10 MG tablet Commonly known as: ATARAX Take 10 mg by mouth 2 (two)  times daily as needed.   LIBREXIA-AF apixaban or placebo 5 mg Caps capsule Take 1 capsule (5 mg total) by mouth 2 (two) times daily. Take at approximately the same time of day with or without food. Please bring bottle back with you to every visit; do not discard bottle. Please contact [facility] for any questions or concerns regarding this medication.   LIBREXIA-AF [id number] (Milvexian) or placebo 100 mg tablet Take 1 tablet by mouth 2 (two) times daily. Take at approximately the same time of day with or without food. Please bring bottle back with you to every visit; do not discard bottle. Please contact [facility] for any questions or concerns regarding this medication.   lisinopril 40 MG tablet Commonly known as: ZESTRIL TAKE 1 TABLET(40 MG) BY MOUTH DAILY   metFORMIN 500 MG 24 hr tablet Commonly known as: GLUCOPHAGE-XR Take 4 tablets (2,000 mg total) by mouth daily in the afternoon.   Microlet Lancets Misc USE AS DIRECTED TWICE DAILY   multivitamin tablet Take 1 tablet by mouth daily.   pantoprazole 40 MG tablet Commonly known as: PROTONIX TAKE 1 TABLET(40 MG) BY MOUTH DAILY   pentoxifylline 400 MG CR tablet Commonly known as: TRENTAL Take 1 tablet (400 mg total) by mouth 3 (three) times daily with meals.   pregabalin 300 MG capsule Commonly known as: LYRICA TAKE  1 CAPSULE(300 MG) BY MOUTH TWICE DAILY   repaglinide 0.5 MG tablet Commonly known as: PRANDIN Take 1 tablet (0.5 mg total) by mouth daily before supper.   rOPINIRole 0.5 MG tablet Commonly known as: REQUIP TAKE 1 TABLET(0.5 MG) BY MOUTH TWICE DAILY   rosuvastatin 20 MG tablet Commonly known as: CRESTOR TAKE 1 TABLET(20 MG) BY MOUTH AT BEDTIME   Trulicity 4.5 MG/0.5ML Sopn Generic drug: Dulaglutide Inject 4.5 mg into the skin once a week.   varenicline 1 MG tablet Commonly known as: CHANTIX TAKE 1 TABLET(1 MG) BY MOUTH TWICE DAILY         OBJECTIVE:   Vital Signs: BP 124/72 (BP Location: Left Arm, Patient Position: Sitting, Cuff Size: Large)   Pulse 72   Ht 5' 9.5" (1.765 m)   Wt (!) 302 lb (137 kg)   SpO2 97%   BMI 43.96 kg/m   Wt Readings from Last 3 Encounters:  10/28/22 (!) 302 lb (137 kg)  10/12/22 293 lb 2 oz (133 kg)  08/02/22 292 lb 12.8 oz (132.8 kg)     Exam: General: Pt appears well and is in NAD  Neck: General: Supple without adenopathy. Thyroid: Thyroid size normal.  No goiter or nodules appreciated.   Lungs: Clear with good BS bilat   Heart: RRR   Extremities: B/L stasis dermatitis   Neuro: MS is good with appropriate affect, pt is alert and Ox3    DM foot exam:08/30/2022 per podiatry      DATA REVIEWED:  Lab Results  Component Value Date   HGBA1C 6.6 (A) 10/28/2022   HGBA1C 6.7 (A) 06/10/2022   HGBA1C 6.0 (A) 03/18/2022    Latest Reference Range & Units 06/10/22 12:53  Microalbumin, Urine Not Estab. ug/mL 16.5  MICROALB/CREAT RATIO 0 - 29 mg/g creat 25  Creatinine, Urine Not Estab. mg/dL 95.6     Latest Reference Range & Units 07/19/22 10:06  Sodium 134 - 144 mmol/L 144  Potassium 3.5 - 5.2 mmol/L 4.5  Chloride 96 - 106 mmol/L 106  CO2 20 - 29 mmol/L 22  Glucose 70 - 99 mg/dL 213 (  H)  BUN 8 - 27 mg/dL 20  Creatinine 4.09 - 8.11 mg/dL 9.14  Calcium 8.6 - 78.2 mg/dL 9.6  BUN/Creatinine Ratio 10 - 24  20  eGFR >59 mL/min/1.73  88  Alkaline Phosphatase 44 - 121 IU/L 46  Albumin 3.9 - 4.9 g/dL 4.2  AST 0 - 40 IU/L 20  ALT 0 - 44 IU/L 35  Total Protein 6.0 - 8.5 g/dL 5.9 (L)  Total Bilirubin 0.0 - 1.2 mg/dL 0.4  BILIRUBIN, DIRECT 0.00 - 0.40 mg/dL 9.56  Total CHOL/HDL Ratio 0.0 - 5.0 ratio 3.3  Cholesterol, Total 100 - 199 mg/dL 213  HDL Cholesterol >08 mg/dL 36 (L)  MICROALB/CREAT RATIO 0 - 29 mg/g creat 29  Triglycerides 0 - 149 mg/dL 657 (H)  VLDL Cholesterol Cal 5 - 40 mg/dL 31  LDL Chol Calc (NIH) 0 - 99 mg/dL 51  (H): Data is abnormally high (L): Data is abnormally low  In office BG 137 mg/dL   ASSESSMENT / PLAN / RECOMMENDATIONS:   1) Type 2 Diabetes Mellitus, Optimally controlled, With neuropathic and macrovascular complications - Most recent A1c of 6.6 %. Goal A1c < 7.0 %.    -A1c at goal without hypoglycemia -He has some nausea with Trulicity, but this is transient and tolerable.  Used to be on Victoza and Ozempic but had to switch due to shortage, but they have noticed better clinical outcome with Trulicity rather than the Victoza and the Ozempic.  We discussed that if there continues to be a shortage of supply we may try 3 mg of Trulicity -Since changing insurance in March 2024, [name] has been costing $500, he would not qualify for patient assistance.  I have sent a prescription for Jardiance to see if that is on the formulary   MEDICATIONS: Continue  Repaglinide 0.5 mg , 1 tablet before Supper  Continue Metformin 500 mg XR , 4 tablets daily  Continue Trulicity 4.5 mg weekly  Switch Farxiga to Jardiance 25 mg daily  EDUCATION / INSTRUCTIONS: BG monitoring instructions: Patient is instructed to check his blood sugars 1 times a day, fasting . Call [facility] if: BG persistently < 70  I reviewed the Rule of 15 for the treatment of hypoglycemia in detail with the patient. Literature supplied.    2) Diabetic complications:  Eye: Does not have known diabetic retinopathy.   Neuro/ Feet: Does  have known diabetic peripheral neuropathy .  Renal: Patient does not have known baseline CKD. He   is  on an ACEI/ARB at present.      F/U in 6 months     Signed electronically by: Lyndle Herrlich, MD  [facility] End[facility]inology  [facility] [address] [place] [place], [place] [address] Phone: [phone] FAX: [phone]   CC: Hannah Beat, MD [address] [place] [address] Phone: [phone]  Fax: [phone]  Return to Endocrinology clinic as below: Future Appointments  Date Time Provider Department Center  10/31/2022 10:30 AM Joan Flores, NP CP-CP None  11/03/2022 11:00 AM Karie Kirks, [facility] None  11/03/2022  4:30 PM [facility]  11/10/2022 11:00 AM Karie Kirks, [facility]-BF None  11/14/2022  7:05 AM [facility] DEVICE REMOTES [facility]  11/17/2022 11:00 AM Karie Kirks, [facility] None  11/24/2022 11:00 AM Karie Kirks, [facility] None  12/08/2022 11:00 AM Karie Kirks, [facility] LBB[facility]-BF None  12/15/2022 11:00 AM Jennelle Human Sheppard Plumber, [facility] LBB[facility] None  12/19/2022  7:00 AM [facility] DEVICE REMOTES [facility]  12/22/2022 11:00 AM Cottle, Sheppard Plumber, [facility] LBB[facility] None  12/29/2022 11:00 AM Karie Kirks, [facility] LBB[facility] None  01/05/2023 11:00 AM Karie Kirks, [facility] LBB[facility] None  01/12/2023 11:00 AM Karie Kirks, [facility] L[facility] None  01/18/2023  3:15 PM Freddie Breech, DPM [facility]  01/19/2023 11:00 AM Jennelle Human, Sheppard Plumber, [facility] LBBH-BF None  01/23/2023  9:20 AM Hannah Beat, MD [facility] PEC  01/26/2023  9:20 AM End, Cristal Deer, MD [facility] None  01/26/2023 11:00 AM Karie Kirks, [facility] LBBH-BF None  08/02/2023  1:00 PM Micki Riley, MD [facility] None

## 2022-10-28 NOTE — Patient Instructions (Signed)
-   Switch Farxiga to Jardiance 25 mg , 1 tablet daily  - Continue Repaglinide 0.5 mg , 1 tablet before Supper  - Continue Metformin 500 mg XR , 4 tablets daily  - Continue Trulicity 4.5 mg weekly    HOW TO TREAT LOW BLOOD SUGARS (Blood sugar LESS THAN 70 MG/DL) Please follow the RULE OF 15 for the treatment of hypoglycemia treatment (when your (blood sugars are less than 70 mg/dL)   STEP 1: Take 15 grams of carbohydrates when your blood sugar is low, which includes:  3-4 GLUCOSE TABS  OR 3-4 OZ OF JUICE OR REGULAR SODA OR ONE TUBE OF GLUCOSE GEL    STEP 2: RECHECK blood sugar in 15 MINUTES STEP 3: If your blood sugar is still low at the 15 minute recheck --> then, go back to STEP 1 and treat AGAIN with another 15 grams of carbohydrates.

## 2022-10-29 ENCOUNTER — Encounter: Payer: Self-pay | Admitting: Internal Medicine

## 2022-10-31 ENCOUNTER — Ambulatory Visit: Payer: Medicare HMO | Admitting: Behavioral Health

## 2022-10-31 ENCOUNTER — Other Ambulatory Visit: Payer: Self-pay | Admitting: Internal Medicine

## 2022-10-31 MED ORDER — REPAGLINIDE 0.5 MG PO TABS: 0.5000 mg | ORAL_TABLET | Freq: Two times a day (BID) | ORAL | 3 refills | Status: DC

## 2022-11-02 ENCOUNTER — Other Ambulatory Visit (HOSPITAL_COMMUNITY): Payer: Self-pay

## 2022-11-02 MED ORDER — STUDY - LIBREXIA-AF - APIXABAN 5 MG OR PLACEBO CAPSULE (PI-SETHI): 5.0000 mg | ORAL_CAPSULE | Freq: Two times a day (BID) | ORAL | 0 refills | Status: DC

## 2022-11-02 MED ORDER — STUDY - LIBREXIA-AF - JNJ-70033093 (MILVEXIAN) 100 MG OR PLACEBO (PI-SETHI): 1.0000 | ORAL_TABLET | Freq: Two times a day (BID) | ORAL | 0 refills | Status: DC

## 2022-11-03 ENCOUNTER — Ambulatory Visit (INDEPENDENT_AMBULATORY_CARE_PROVIDER_SITE_OTHER): Payer: Medicare HMO | Admitting: Psychology

## 2022-11-03 ENCOUNTER — Other Ambulatory Visit: Payer: Self-pay

## 2022-11-03 ENCOUNTER — Ambulatory Visit: Payer: Medicare HMO | Admitting: Psychology

## 2022-11-03 ENCOUNTER — Other Ambulatory Visit: Payer: Medicare HMO

## 2022-11-03 DIAGNOSIS — F331 Major depressive disorder, recurrent, moderate: Secondary | ICD-10-CM

## 2022-11-03 DIAGNOSIS — F411 Generalized anxiety disorder: Secondary | ICD-10-CM

## 2022-11-03 MED ORDER — TRUE METRIX AIR GLUCOSE METER W/DEVICE KIT: 1.0000 | PACK | 0 refills | Status: DC
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
## 2022-11-07 NOTE — Progress Notes (Signed)
[facility] Summary Report / Loop Recorder 

## 2022-11-08 ENCOUNTER — Other Ambulatory Visit: Payer: Self-pay | Admitting: *Deleted

## 2022-11-08 DIAGNOSIS — E1159 Type 2 diabetes mellitus with other circulatory complications: Secondary | ICD-10-CM

## 2022-11-08 MED ORDER — BD SWAB SINGLE USE REGULAR PADS
MEDICATED_PAD | 3 refills | Status: DC
  Filled 2023-07-12: qty 100, 100d supply, fill #0

## 2022-11-10 ENCOUNTER — Ambulatory Visit: Payer: Medicare HMO | Admitting: Behavioral Health

## 2022-11-10 ENCOUNTER — Ambulatory Visit: Payer: Medicare HMO | Admitting: Psychology

## 2022-11-10 ENCOUNTER — Encounter: Payer: Self-pay | Admitting: Family Medicine

## 2022-11-10 ENCOUNTER — Other Ambulatory Visit: Payer: Self-pay

## 2022-11-10 MED ORDER — FLUOXETINE HCL 40 MG PO CAPS: 80.0000 mg | ORAL_CAPSULE | Freq: Every day | ORAL | 0 refills | Status: DC

## 2022-11-14 ENCOUNTER — Ambulatory Visit: Payer: Medicare HMO | Admitting: Behavioral Health

## 2022-11-14 ENCOUNTER — Ambulatory Visit (INDEPENDENT_AMBULATORY_CARE_PROVIDER_SITE_OTHER): Payer: Medicare HMO

## 2022-11-14 ENCOUNTER — Other Ambulatory Visit: Payer: Self-pay | Admitting: *Deleted

## 2022-11-14 DIAGNOSIS — E1159 Type 2 diabetes mellitus with other circulatory complications: Secondary | ICD-10-CM

## 2022-11-14 DIAGNOSIS — I639 Cerebral infarction, unspecified: Secondary | ICD-10-CM

## 2022-11-14 LAB — CUP PACEART REMOTE DEVICE CHECK
Date Time Interrogation Session: 20240616230112
Implantable Pulse Generator Implant Date: 20211008

## 2022-11-14 MED ORDER — TRUE METRIX BLOOD GLUCOSE TEST VI STRP
ORAL_STRIP | 3 refills | Status: DC
  Filled 2023-06-12: qty 100, 100d supply, fill #0

## 2022-11-17 ENCOUNTER — Ambulatory Visit (INDEPENDENT_AMBULATORY_CARE_PROVIDER_SITE_OTHER): Payer: Medicare HMO | Admitting: Psychology

## 2022-11-17 DIAGNOSIS — F331 Major depressive disorder, recurrent, moderate: Secondary | ICD-10-CM

## 2022-11-17 DIAGNOSIS — F411 Generalized anxiety disorder: Secondary | ICD-10-CM

## 2022-11-17 NOTE — Progress Notes (Signed)
[facility] Counselor/Therapist Progress Note  Patient ID: Marin Koelle, MRN: [id number],    Date: 11/17/2022  Time Spent: 45 mins  Treatment Type: Individual Therapy  Reported Symptoms: Pt presents for session, via Caregility video.  Pt grants consent for the session and stating he understands the limits of virtual sessions, stating his is in his home with no one else present.  I shared with pt that I am in my office with no one else here either.   Mental Status Exam: Appearance:  Casual     Behavior: Appropriate  Motor: Normal  Speech/Language:  Clear and Coherent  Affect: Depressed and Flat  Mood: depressed  Thought process: normal  Thought content:   WNL  Sensory/Perceptual disturbances:   WNL  Orientation: oriented to person, place, and situation  Attention: Good  Concentration: Fair  Memory: Recent;   Fair  Fund of knowledge:  Good  Insight:   Good  Judgment:  Good  Impulse Control: Good   Risk Assessment: Danger to Self:  No Self-injurious Behavior: No Danger to Others: No Duty to Warn:no Physical Aggression / Violence:No  Access to Firearms a concern: No  Gang Involvement:No   Subjective: Pt shares that "I am feeling much better with my BP; it is much lower than it was before and that is making me feel better.  It may even been a little too low at this point but we are still checking it and keeping track of it."  Pt is pleased to be feeling better lately.  Pt shares he celebrated Science writer Day with his daughters and they went to a friend's house to use their pool.  They had lunch together and pt enjoyed the time with the girls.  Pt enjoyed being in the pool; "I had not been in a pool in probably 15 yrs or more; it was fun to be back in the pool.  The girls also gave me presents for Father's Day and that was really nice."  They went back to the same friends' home the next day to celebrate pt's friend's birthday.  Pt shares that tomorrow is his and [name]'s  38th wedding anniversary and it is [name]'s 10th or 11th anniversary; she and her husband are currently separated but they are trying to reconcile.  Pt shares that [name] (will be 62 yo in July) has been getting along with them better lately as well.  Pt continues to take his Jardiance daily and he feels like it is working well for him.  Pt is afraid of hitting the doughnut hole again with it as well.  Pt shares he continues to color and work on puzzles as means of self care and feels like they help improve his mood.  Pt also enjoyed being able to get into pool last weekend; he smiles openly when talking about his time there.  He has also been continuing his chair exercises and has enjoyed cooking as well.  Encouraged pt to continue with his self care activities and we will meet in three weeks for a follow up session, because of a appt conflict next week and the July 4th holiday.  Interventions: Cognitive Behavioral Therapy  Diagnosis:Moderate episode of recurrent major depressive disorder (HCC)  Generalized anxiety disorder  Plan: Treatment Plan Strengths/Abilities:  Intelligent, Intuitive, Willing to participate in therapy Treatment Preferences:  Outpatient Individual Therapy Statement of Needs:  Patient is to use CBT, mindfulness and coping skills to help manage and/or decrease symptoms associated with their diagnosis. Symptoms:  Depressed/Irritable mood, worry, social withdrawal Problems Addressed:  Depressive thoughts, Sadness, Sleep issues, etc. Long Term Goals:  Pt to reduce overall level, frequency, and intensity of the feelings of depression/anxiety as evidenced by decreased irritability, negative self talk, and helpless feelings from 6 to 7 days/week to 0 to 1 days/week, per client report, for at least 3 consecutive months.  Progress: 20% Short Term Goals:  Pt to verbally express understanding of the relationship between feelings of depression/anxiety and their impact on thinking patterns and  behaviors.  Pt to verbalize an understanding of the role that distorted thinking plays in creating fears, excessive worry, and ruminations.  Progress: 20% Target Date:  06/03/2023 Frequency:  Bi-weekly Modality:  Cognitive Behavioral Therapy Interventions by Therapist:  Therapist will use CBT, Mindfulness exercises, Coping skills and Referrals, as needed by client. Client has verbally approved this treatment plan.  Karie Kirks, [facility]

## 2022-11-21 ENCOUNTER — Other Ambulatory Visit: Payer: Self-pay | Admitting: *Deleted

## 2022-11-21 DIAGNOSIS — E1159 Type 2 diabetes mellitus with other circulatory complications: Secondary | ICD-10-CM

## 2022-11-21 MED ORDER — TRUEPLUS LANCETS 33G MISC
3 refills | Status: DC
  Filled 2023-06-12: qty 200, 100d supply, fill #0
  Filled 2023-07-12: qty 200, fill #0
  Filled 2023-08-06 – 2023-08-24 (×2): qty 200, 100d supply, fill #0
  Filled 2023-09-02: qty 200, 90d supply, fill #0
  Filled 2023-09-21: qty 200, 100d supply, fill #0

## 2022-11-24 ENCOUNTER — Ambulatory Visit (INDEPENDENT_AMBULATORY_CARE_PROVIDER_SITE_OTHER): Payer: Medicare HMO | Admitting: Podiatry

## 2022-11-24 ENCOUNTER — Ambulatory Visit: Payer: Medicare HMO | Admitting: Psychology

## 2022-11-24 DIAGNOSIS — M2042 Other hammer toe(s) (acquired), left foot: Secondary | ICD-10-CM | POA: Diagnosis not present

## 2022-11-24 DIAGNOSIS — M2041 Other hammer toe(s) (acquired), right foot: Secondary | ICD-10-CM

## 2022-11-24 DIAGNOSIS — E1142 Type 2 diabetes mellitus with diabetic polyneuropathy: Secondary | ICD-10-CM

## 2022-11-24 DIAGNOSIS — E11610 Type 2 diabetes mellitus with diabetic neuropathic arthropathy: Secondary | ICD-10-CM | POA: Diagnosis not present

## 2022-11-24 DIAGNOSIS — E1159 Type 2 diabetes mellitus with other circulatory complications: Secondary | ICD-10-CM

## 2022-11-24 NOTE — Progress Notes (Signed)
Patient presents today to pick up diabetic shoes and insoles.  Patient was dispensed 1 pair of diabetic shoes and 3 pairs of foam casted diabetic insoles. Fit was satisfactory. Instructions for break-in and wear was reviewed and a copy was given to the patient.   Re-appointment for regularly scheduled diabetic foot care visits or if they should experience any trouble with the shoes or insoles.  

## 2022-11-26 ENCOUNTER — Other Ambulatory Visit: Payer: Self-pay | Admitting: Family Medicine

## 2022-11-26 ENCOUNTER — Encounter: Payer: Self-pay | Admitting: Family Medicine

## 2022-11-28 MED ORDER — HYDROCHLOROTHIAZIDE 25 MG PO TABS
25.0000 mg | ORAL_TABLET | Freq: Every day | ORAL | 3 refills | Status: DC
  Filled 2023-06-12: qty 90, 90d supply, fill #0

## 2022-11-30 ENCOUNTER — Encounter: Payer: Self-pay | Admitting: Behavioral Health

## 2022-11-30 ENCOUNTER — Ambulatory Visit: Payer: Medicare HMO | Attending: Internal Medicine | Admitting: Internal Medicine

## 2022-11-30 ENCOUNTER — Ambulatory Visit: Payer: Medicare HMO | Admitting: Behavioral Health

## 2022-11-30 ENCOUNTER — Encounter: Payer: Self-pay | Admitting: Internal Medicine

## 2022-11-30 VITALS — BP 94/60 | HR 92 | Ht 70.0 in | Wt 301.0 lb

## 2022-11-30 DIAGNOSIS — E1169 Type 2 diabetes mellitus with other specified complication: Secondary | ICD-10-CM

## 2022-11-30 DIAGNOSIS — I10 Essential (primary) hypertension: Secondary | ICD-10-CM

## 2022-11-30 DIAGNOSIS — F331 Major depressive disorder, recurrent, moderate: Secondary | ICD-10-CM | POA: Diagnosis not present

## 2022-11-30 DIAGNOSIS — Z7984 Long term (current) use of oral hypoglycemic drugs: Secondary | ICD-10-CM | POA: Diagnosis not present

## 2022-11-30 DIAGNOSIS — E785 Hyperlipidemia, unspecified: Secondary | ICD-10-CM

## 2022-11-30 DIAGNOSIS — I480 Paroxysmal atrial fibrillation: Secondary | ICD-10-CM | POA: Diagnosis not present

## 2022-11-30 DIAGNOSIS — F411 Generalized anxiety disorder: Secondary | ICD-10-CM

## 2022-11-30 MED ORDER — DULOXETINE HCL 60 MG PO CPEP: 60.0000 mg | ORAL_CAPSULE | Freq: Two times a day (BID) | ORAL | 1 refills | Status: DC

## 2022-11-30 MED ORDER — FLUOXETINE HCL 40 MG PO CAPS: 80.0000 mg | ORAL_CAPSULE | Freq: Every day | ORAL | 1 refills | Status: DC

## 2022-11-30 MED ORDER — LISINOPRIL 10 MG PO TABS: 10.0000 mg | ORAL_TABLET | Freq: Every day | ORAL | 3 refills | Status: DC

## 2022-11-30 NOTE — Progress Notes (Unsigned)
Cardiology Office Note:  .   Date:  12/01/2022  ID:  [name] [name], DOB 07/23/1960, MRN [id number] PCP: Hannah Beat, MD  [facility] Providers Cardiologist:  Yvonne Kendall, MD     History of Present Illness: .   [name] [name] is a 62 y.o. male with history of paroxysmal atrial fibrillation, peripheral arterial disease, hypertension, hyperlipidemia, type 2 diabetes mellitus, and stroke (01/2020), who presents for follow-up of atrial fibrillation.  I met him in 04/2022, at which time he had multiple concerns including intermittent chest discomfort, a "funny feeling" in his head, leg pain, and intermittent palpitations.  We agreed to perform a myocardial PET/CT and ABIs for further evaluation of his chest and leg pains.  Stress test was low risk without evidence of ischemia or scar.  LVEF was mildly reduced at rest but normalized with stress.  Myocardial blood flow reserve was normal.  Lower extremity vascular studies did not show any significant narrowing in either leg.  Subsequent echo was normal other than mild diastolic dysfunction.  Today, Mr.  [name] reports that he had been doing well up until a few weeks ago when dapagliflozin had to be discontinued due to cost constraints.  This made his blood pressure go "haywire."  Several changes to his blood pressure medications were made by Dr. Patsy Lager.  Ultimately, Mr. Weitkamp was started on empagliflozin instead and notes that his blood pressures are gradually normalizing.  He notes that his leg edema also worsened after discontinuation of dapagliflozin and has not returned back to normal.  He notes occasional twinges of pain in his chest that he attributes to soreness related to his ILR.  He has not had any exertional chest pain.  He also denies shortness of breath, palpitations, and lightheadedness.  He notes that his heart rates have been somewhat more labile than he is accustomed to.  ROS: See HPI  Studies Reviewed: Marland Kitchen   EKG  Interpretation Date/Time:  Wednesday November 30 2022 15:31:20 EDT Ventricular Rate:  92 PR Interval:  190 QRS Duration:  108 QT Interval:  382 QTC Calculation: 472 R Axis:   -58  Text Interpretation: Sinus rhythm with frequent Premature ventricular complexes Left axis deviation Low voltage QRS Inferior infarct (cited on or before 15-Oct-2020) Cannot rule out Anteroseptal infarct (cited on or before 23-Sep-2020) When compared with ECG of 05-May-2022 Premature ventricular complexes are now Present Right bundle branch block is no longer present Confirmed by Mcdaniel Ohms [phone]) on 12/01/2022 2:26:43 PM    TTE (09/20/2022): Normal LV size and wall thickness.  LVEF 55-60% with grade 1 diastolic dysfunction.  Normal RV size and function.  Normal biatrial size.  No pericardial effusion.  No significant valvular abnormalities.  Normal CVP.  ABIs and arterial Dopplers (08/12/2022): Bilateral lower extremity arteries widely patent with no significant plaque.  ABIs 1.28 on the right and 1.24 on the left.  TBI 0.47 on the right and 0.59 on the left.  Myocardial PET/CT (08/09/2022): Normal, low risk study without evidence of ischemia or scar.  Mildly reduced LVEF at rest, which normalizes with stress.  Normal myocardial blood flow reserve.  Coronary artery calcifications present.  Risk Assessment/Calculations:    CHA2DS2-VASc Score = 5  This indicates a 7.2% annual risk of stroke. The patient's score is based upon: CHF History: 0 HTN History: 1 Diabetes History: 1 Stroke History: 2 Vascular Disease History: 1 Age Score: 0 Gender Score: 0         Physical Exam:   VS:  BP 94/60 (BP Location: Left Arm, Patient Position: Sitting, Cuff Size: Large)   Pulse 92   Ht 5\' 10"  (1.778 m)   Wt (!) 301 lb (136.5 kg)   SpO2 98%   BMI 43.19 kg/m    Wt Readings from Last 3 Encounters:  11/30/22 (!) 301 lb (136.5 kg)  10/28/22 (!) 302 lb (137 kg)  10/12/22 293 lb 2 oz (133 kg)  Repeat BP: 100/62  General:   NAD. Neck: Unable to assess JVP due to body habitus. Lungs: Clear to auscultation bilaterally without wheezes or crackles. Heart: Regular rate and rhythm with occasional extrasystoles.  No murmurs, rubs, or gallops. Abdomen: Soft, nontender, nondistended. Extremities: Trace pretibial edema bilaterally.  ASSESSMENT AND PLAN: .   Paroxysmal atrial fibrillation: Mr. Brull is in sinus rhythm today, though frequent PVCs are noted on EKG (asymptomatic).  Given multiple medication changes over the last few weeks, we have agreed to check a basic metabolic panel and magnesium level to ensure that his electrolytes are stable.  No significant arrhythmias including VT or atrial fibrillation noted on most recent ILR interrogation last month.  Continue anticoagulation per the Librexia-AF trial (milvexian vs. apixaban).  Hypertension: Blood pressure borderline low today though readings have been somewhat labile over the last few months after discontinuation of dapagliflozin.  We have agreed to decrease lisinopril to 10 mg daily.  I will check a BMP today as well as magnesium level to ensure stable electrolytes.  Hyperlipidemia associated with type 2 diabetes mellitus: Continue rosuvastatin and ongoing management of DM per Dr. Patsy Lager.  Morbid obesity: BMI remains greater than 40 with multiple comorbidities.  Weight loss encouraged through diet and exercise.    Dispo: Return to clinic in 1 month to reassess blood pressure and PVCs.  Signed, Yvonne Kendall, MD

## 2022-11-30 NOTE — Progress Notes (Signed)
[facility]  Patient ID: [name] [name],  MRN: [id number]  PCP: Hannah Beat, MD  Date of Evaluation: 11/30/2022 Time spent:20 minutes  Chief Complaint:  Chief Complaint   Anxiety; Depression; Follow-up; Medication Refill; Patient Education     HISTORY/CURRENT STATUS: HPI "[name] [name]", 62 year old male presents to this office for follow up and medication management.   His wife is present with his verbal consent collateral information should be considered reliable.  Says that he feels like he has much improved since last visit and his wife agrees. Says that the medication has helped him use coping skills more effectively. He does not want to adjust his medication at this time.  Still struggles with anxiety at times.  Numerically rates his anxiety today at 2/10, and depression at 2/10.  He is sleeping 7 to 8 hours per night.  He denies any history of mania, no psychosis, no auditory or visual hallucinations.  Denies SI or HI.  Has strong family support from his wife and daughters.   Past psychiatric medication trials: Klonopin Prozac Duloxetine       Individual Medical History/ Review of Systems: Changes? :No   Allergies: Cashew nut oil, Pistachio nut (diagnostic), Latex, Tape, Betadine [povidone iodine], Lidocaine, and Penicillins  Current Medications:  Current Outpatient Medications:    Blood Glucose Monitoring Suppl (TRUE METRIX AIR GLUCOSE METER) w/Device KIT, 1 each by Does not apply route as directed., Disp: 1 kit, Rfl: 0   TRUEplus Lancets 33G MISC, Use to check blood sugar 2 times a day, Disp: 200 each, Rfl: 3   acetaminophen (TYLENOL) 500 MG tablet, Take 1,000 mg by mouth 2 (two) times daily as needed (for pain)., Disp: , Rfl:    Alcohol Swabs (B-D SINGLE USE SWABS REGULAR) PADS, Use to check blood sugar daily, Disp: 100 each, Rfl: 3   cetirizine (ZYRTEC) 10 MG tablet, Take 10 mg by mouth daily., Disp: , Rfl:    clonazePAM (KLONOPIN) 0.5 MG tablet, TAKE 1/2 TO  1 TABLET(0.25 TO 0.5 MG) BY MOUTH TWICE DAILY AS NEEDED FOR ANXIETY, Disp: 60 tablet, Rfl: 0   diltiazem (CARDIZEM) 30 MG tablet, TAKE 1 TABLET BY MOUTH EVERY 4 HOURS AS NEEDED FOR HEART RATE>100., Disp: 90 tablet, Rfl: 0   Dulaglutide (TRULICITY) 4.5 MG/0.5ML SOPN, Inject 4.5 mg into the skin once a week., Disp: 6 mL, Rfl: 3   DULoxetine (CYMBALTA) 60 MG capsule, Take 1 capsule (60 mg total) by mouth 2 (two) times daily., Disp: 180 capsule, Rfl: 1   EPINEPHrine 0.3 mg/0.3 mL IJ SOAJ injection, Inject 0.3 mg into the muscle as needed for anaphylaxis., Disp: 2 each, Rfl: 0   FLUoxetine (PROZAC) 40 MG capsule, Take 2 capsules (80 mg total) by mouth daily., Disp: 180 capsule, Rfl: 1   glucose blood (TRUE METRIX BLOOD GLUCOSE TEST) test strip, Use to check blood sugar once daily, Disp: 100 each, Rfl: 3   hydrochlorothiazide (HYDRODIURIL) 25 MG tablet, Take 1 tablet (25 mg total) by mouth daily., Disp: 90 tablet, Rfl: 3   hydrOXYzine (ATARAX) 10 MG tablet, Take 10 mg by mouth 2 (two) times daily as needed., Disp: , Rfl:    lisinopril (ZESTRIL) 40 MG tablet, TAKE 1 TABLET(40 MG) BY MOUTH DAILY, Disp: 90 tablet, Rfl: 3   metFORMIN (GLUCOPHAGE-XR) 500 MG 24 hr tablet, Take 4 tablets (2,000 mg total) by mouth daily in the afternoon., Disp: 360 tablet, Rfl: 3   Multiple Vitamin (MULTIVITAMIN) tablet, Take 1 tablet by mouth daily., Disp: , Rfl:  pantoprazole (PROTONIX) 40 MG tablet, TAKE 1 TABLET(40 MG) BY MOUTH DAILY, Disp: 90 tablet, Rfl: 3   pentoxifylline (TRENTAL) 400 MG CR tablet, Take 1 tablet (400 mg total) by mouth 3 (three) times daily with meals., Disp: 270 tablet, Rfl: 3   pregabalin (LYRICA) 300 MG capsule, TAKE 1 CAPSULE(300 MG) BY MOUTH TWICE DAILY, Disp: 180 capsule, Rfl: 3   repaglinide (PRANDIN) 0.5 MG tablet, Take 1 tablet (0.5 mg total) by mouth 2 (two) times daily before a meal., Disp: 180 tablet, Rfl: 3   rOPINIRole (REQUIP) 0.5 MG tablet, TAKE 1 TABLET(0.5 MG) BY MOUTH TWICE DAILY,  Disp: 180 tablet, Rfl: 3   rosuvastatin (CRESTOR) 20 MG tablet, TAKE 1 TABLET(20 MG) BY MOUTH AT BEDTIME, Disp: 90 tablet, Rfl: 3   STUDY - LIBREXIA-AF - apixaban 5 mg or placebo capsule (PI-Sethi), Take 1 capsule (5 mg total) by mouth 2 (two) times daily. Take at approximately the same time of day with or without food. Please bring bottle back with you to every visit; do not discard bottle. Please contact [facility] for any questions or concerns regarding this medication., Disp: 280 capsule, Rfl: 0   STUDY - LIBREXIA-AF - [id number] (Milvexian) 100 mg or placebo tablet (PI-Sethi), Take 1 tablet by mouth 2 (two) times daily. Take at approximately the same time of day with or without food. Please bring bottle back with you to every visit; do not discard bottle. Please contact [facility] for any questions or concerns regarding this medication., Disp: 280 tablet, Rfl: 0   varenicline (CHANTIX) 1 MG tablet, TAKE 1 TABLET(1 MG) BY MOUTH TWICE DAILY, Disp: 60 tablet, Rfl: 3 Medication Side Effects: none  Family Medical/ Social History: Changes? No  MENTAL HEALTH EXAM:  There were no vitals taken for this visit.There is no height or weight on file to calculate BMI.  General Appearance: Casual and Neat  Eye Contact:  Good  Speech:  Clear and Coherent  Volume:  Normal  Mood:  NA  Affect:  Appropriate  Thought Process:  Coherent  Orientation:  Full (Time, Place, and Person)  Thought Content: Logical   Suicidal Thoughts:  No  Homicidal Thoughts:  No  Memory:  WNL  Judgement:  Good  Insight:  Good  Psychomotor Activity:  Normal  Concentration:  Concentration: Good  Recall:  Good  Fund of Knowledge: Good  Language: Good  Assets:  Desire for Improvement  ADL's:  Intact  Cognition: WNL  Prognosis:  Good    DIAGNOSES:    ICD-10-CM   1. Moderate episode of recurrent major depressive disorder (HCC)  F33.1 FLUoxetine (PROZAC) 40 MG capsule    DULoxetine  (CYMBALTA) 60 MG capsule    2. Generalized anxiety disorder  F41.1 FLUoxetine (PROZAC) 40 MG capsule    DULoxetine (CYMBALTA) 60 MG capsule      Receiving Psychotherapy: No    RECOMMENDATIONS:   Greater than 50% of 20 min face to face time with patient was spent on counseling and coordination of care. Discussed his improvement with anxiety and depression . He is feeling well enough to request no changes to his medication regimen. He will continue to see Maggie Font  for therapy.    Pt will continue: Cymbalta 60 twice daily mg daily To continue Prozac 40 mg twice daily  Continue Klonopin take 1/2 to 1 tablet twice daily for severe anxiety.  Will report worsening symptoms or side effects promptly Will follow-up in 12 weeks to reassess Provided emergency  contact information Discussed potential benefits, risk, and side effects of benzodiazepines to include potential risk of tolerance and dependence, as well as possible drowsiness.  Advised patient not to drive if experiencing drowsiness and to take lowest possible effective dose to minimize risk of dependence and tolerance.  Reviewed PDMP       Joan Flores, NP

## 2022-11-30 NOTE — Patient Instructions (Signed)
Medication Instructions:   Decrease Lisinopril to 10 mg daily   *If you need a refill on your cardiac medications before your next appointment, please call your pharmacy*   Lab Work:  Your provider would like for you to have following labs drawn: BMP, MG.   Please go to the [facility] entrance and check in at the front desk.  You do not need an appointment.  They are open from 7am-6 pm.    If you have labs (blood work) drawn today and your tests are completely normal, you will receive your results only by: MyChart Message (if you have MyChart) OR A paper copy in the mail If you have any lab test that is abnormal or we need to change your treatment, we will call you to review the results.   Testing/Procedures: none   Follow-Up: At [facility], you and your health needs are our priority.  As part of our continuing mission to provide you with exceptional heart care, we have created designated Provider Care Teams.  These Care Teams include your primary Cardiologist (physician) and Advanced Practice Providers (APPs -  Physician Assistants and Nurse Practitioners) who all work together to provide you with the care you need, when you need it.  We recommend signing up for the patient portal called "MyChart".  Sign up information is provided on this After Visit Summary.  MyChart is used to connect with patients for Virtual Visits (Telemedicine).  Patients are able to view lab/test results, encounter notes, upcoming appointments, etc.  Non-urgent messages can be sent to your provider as well.   To learn more about what you can do with MyChart, go to [email or web address].    Your next appointment:   1 month(s)  Provider:   You may see Yvonne Kendall, MD or one of the following Advanced Practice Providers on your designated Care Team:   Nicolasa Ducking, NP Eula Listen, PA-C Cadence Fransico Michael, PA-C Charlsie Quest, NP

## 2022-12-01 ENCOUNTER — Encounter: Payer: Self-pay | Admitting: Internal Medicine

## 2022-12-05 NOTE — Progress Notes (Signed)
[facility] Summary Report / Loop Recorder 

## 2022-12-08 ENCOUNTER — Ambulatory Visit: Payer: Medicare HMO | Admitting: Psychology

## 2022-12-09 DIAGNOSIS — E785 Hyperlipidemia, unspecified: Secondary | ICD-10-CM | POA: Diagnosis not present

## 2022-12-09 DIAGNOSIS — I10 Essential (primary) hypertension: Secondary | ICD-10-CM | POA: Diagnosis not present

## 2022-12-09 DIAGNOSIS — I480 Paroxysmal atrial fibrillation: Secondary | ICD-10-CM | POA: Diagnosis not present

## 2022-12-09 DIAGNOSIS — E1169 Type 2 diabetes mellitus with other specified complication: Secondary | ICD-10-CM | POA: Diagnosis not present

## 2022-12-10 LAB — BASIC METABOLIC PANEL
BUN/Creatinine Ratio: 23 (ref 10–24)
BUN: 29 mg/dL — ABNORMAL HIGH (ref 8–27)
CO2: 23 mmol/L (ref 20–29)
Calcium: 9.9 mg/dL (ref 8.6–10.2)
Chloride: 104 mmol/L (ref 96–106)
Creatinine, Ser: 1.24 mg/dL (ref 0.76–1.27)
Glucose: 134 mg/dL — ABNORMAL HIGH (ref 70–99)
Potassium: 4.2 mmol/L (ref 3.5–5.2)
Sodium: 141 mmol/L (ref 134–144)
eGFR: 66 mL/min/{1.73_m2} (ref >59)

## 2022-12-10 LAB — MAGNESIUM: Magnesium: 2 mg/dL (ref 1.6–2.3)

## 2022-12-14 LAB — CUP PACEART REMOTE DEVICE CHECK
Date Time Interrogation Session: 20240717141616
Implantable Pulse Generator Implant Date: 20211008
Zone Setting Status: 755011
Zone Setting Status: 755011
Zone Setting Status: 755011

## 2022-12-15 ENCOUNTER — Ambulatory Visit (INDEPENDENT_AMBULATORY_CARE_PROVIDER_SITE_OTHER): Payer: Medicare HMO | Admitting: Psychology

## 2022-12-15 DIAGNOSIS — F411 Generalized anxiety disorder: Secondary | ICD-10-CM

## 2022-12-15 DIAGNOSIS — F331 Major depressive disorder, recurrent, moderate: Secondary | ICD-10-CM | POA: Diagnosis not present

## 2022-12-15 NOTE — Progress Notes (Signed)
[facility] Counselor/Therapist Progress Note  Patient ID: [name] [name], MRN: [id number],    Date: 12/15/2022  Time Spent: 45 mins  start time: 1100   end time:  1145  Treatment Type: Individual Therapy  Reported Symptoms: Pt presents for session, via Caregility video.  Pt grants consent for the session and stating he understands the limits of virtual sessions, stating his is in his home with no one else present.  I shared with pt that I am in my office with no one else here either.   Mental Status Exam: Appearance:  Casual     Behavior: Appropriate  Motor: Normal  Speech/Language:  Clear and Coherent  Affect: Depressed and Flat  Mood: depressed  Thought process: normal  Thought content:   WNL  Sensory/Perceptual disturbances:   WNL  Orientation: oriented to person, place, and situation  Attention: Good  Concentration: Fair  Memory: Recent;   Fair  Fund of knowledge:  Good  Insight:   Good  Judgment:  Good  Impulse Control: Good   Risk Assessment: Danger to Self:  No Self-injurious Behavior: No Danger to Others: No Duty to Warn:no Physical Aggression / Violence:No  Access to Firearms a concern: No  Gang Involvement:No   Subjective: Pt shares that "I am feeling some better; I am having medication issues and family crap."  Pt's wife, [name] [name], stops by the session and advises that pt saw his cardiologist, Dr. Okey Dupre, and they did an EKG and it showed PVCs and Dr. Okey Dupre was unsure of what was causing it.  They have run other tests and all the results are fine.  Pt has not had any new medication changes lately.  [name] [name] also shares that family issues have been very difficult for both of them to deal with; all of their daughters have been difficult to deal with recently.  [name] [name] is taking [name] [name] to and from work (one hr each way).  [name] [name] has offered to bring [name] [name] home T, W, and Th afternoons.  [name] [name] and [name] [name] argued in the car on the way home because of [name] [name]'s driving.  They  continued to argue even after they got home and that was really stressful for pt and for [name].  [name] [name] is living in her own home with her husband; they were separated for a year and a half but are trying to reconcile.  [name] [name] is seeing a therapist and a marriage counselor with her husband.  [name] [name] is making Tic Toc videos complaining about pt and [name] [name] as parents and also her sisters.  [name] [name] has blocked pt and [name] [name] and will not take their calls and will not allow them to come visit her at her home.  Pt shares that he and [name] [name] have been back to their friends' home to go swimming and they had a nice time.  Pt shares he continues to color and work on puzzles as means of self care and feels like they help improve his mood.  He has also been continuing his chair exercises and enjoys cooking as well.  Encouraged pt to continue with his self care activities and we will meet next week for a follow up session.  Interventions: Cognitive Behavioral Therapy  Diagnosis:Moderate episode of recurrent major depressive disorder (HCC)  Generalized anxiety disorder  Plan: Treatment Plan Strengths/Abilities:  Intelligent, Intuitive, Willing to participate in therapy Treatment Preferences:  Outpatient Individual Therapy Statement of Needs:  Patient is to use CBT, mindfulness and coping skills to help manage and/or decrease symptoms associated  with their diagnosis. Symptoms:  Depressed/Irritable mood, worry, social withdrawal Problems Addressed:  Depressive thoughts, Sadness, Sleep issues, etc. Long Term Goals:  Pt to reduce overall level, frequency, and intensity of the feelings of depression/anxiety as evidenced by decreased irritability, negative self talk, and helpless feelings from 6 to 7 days/week to 0 to 1 days/week, per client report, for at least 3 consecutive months.  Progress: 20% Short Term Goals:  Pt to verbally express understanding of the relationship between feelings of depression/anxiety and their impact on  thinking patterns and behaviors.  Pt to verbalize an understanding of the role that distorted thinking plays in creating fears, excessive worry, and ruminations.  Progress: 20% Target Date:  06/03/2023 Frequency:  Bi-weekly Modality:  Cognitive Behavioral Therapy Interventions by Therapist:  Therapist will use CBT, Mindfulness exercises, Coping skills and Referrals, as needed by client. Client has verbally approved this treatment plan.  Karie Kirks, [facility]

## 2022-12-19 ENCOUNTER — Ambulatory Visit (INDEPENDENT_AMBULATORY_CARE_PROVIDER_SITE_OTHER): Payer: Medicare HMO

## 2022-12-19 DIAGNOSIS — I639 Cerebral infarction, unspecified: Secondary | ICD-10-CM | POA: Diagnosis not present

## 2022-12-22 ENCOUNTER — Ambulatory Visit (INDEPENDENT_AMBULATORY_CARE_PROVIDER_SITE_OTHER): Payer: Medicare HMO | Admitting: Psychology

## 2022-12-22 DIAGNOSIS — F411 Generalized anxiety disorder: Secondary | ICD-10-CM

## 2022-12-22 DIAGNOSIS — F331 Major depressive disorder, recurrent, moderate: Secondary | ICD-10-CM

## 2022-12-22 NOTE — Progress Notes (Signed)
[facility] Counselor/Therapist Progress Note  Patient ID: [name] [name], MRN: [id number],    Date: 12/22/2022  Time Spent: 45 mins  start time: 1100   end time:  1145  Treatment Type: Individual Therapy  Reported Symptoms: Pt presents for session, via Caregility video.  Pt grants consent for the session and stating he understands the limits of virtual sessions, stating his is in his home with no one else present.  I shared with pt that I am in my office with no one else here either.   Mental Status Exam: Appearance:  Casual     Behavior: Appropriate  Motor: Normal  Speech/Language:  Clear and Coherent  Affect: Depressed and Flat  Mood: depressed  Thought process: normal  Thought content:   WNL  Sensory/Perceptual disturbances:   WNL  Orientation: oriented to person, place, and situation  Attention: Good  Concentration: Fair  Memory: Recent;   Fair  Fund of knowledge:  Good  Insight:   Good  Judgment:  Good  Impulse Control: Good   Risk Assessment: Danger to Self:  No Self-injurious Behavior: No Danger to Others: No Duty to Warn:no Physical Aggression / Violence:No  Access to Firearms a concern: No  Gang Involvement:No   Subjective: Pt shares that "I am feeling OK but my anxiety has been out the roof because [place] got laid off last week and she has to find another job.  [name] is still mad at [place] and is still posting her thoughts on all of our mistakes over the years.  I do not feel as good as I did about three months ago."  Pt shares his BP and BS are OK but he just doesn't feel like himself at this time.  He continues to take the Jardiance and feels like it is beneficial.  He takes his BS twice a day; he shows his readings and they are in the upper 100s to lower 200s.  Pt shares his sleep is OK; he is staying up late but he is sleeping well when he falls asleep.  Pt continues to engage in self care activities; they have been shopping at [employer] shops for a  piece of furniture for [facility] birthday "and I love that activity."  They also went to [employer] which is their favorite Museum/gallery exhibitions officer for [facility] birthday as well.  He has also been playing word games on his phone at his own pace and he appreciates that as helping him learn and accomplish things.  Pt has also been watching favorite movies as a means of self care.  Pt has seen Avelina Laine at the beginning of July for a medication evaluation and nothing was changed at that visit.  He has also been continuing his chair exercises and enjoys cooking as well.  Pt also relays another story about his upbringing; he talks today about going fishing with his father when pt was a teenager.  Encouraged pt to continue with his self care activities and we will meet next week for a follow up session.  Interventions: Cognitive Behavioral Therapy  Diagnosis:Moderate episode of recurrent major depressive disorder (HCC)  Generalized anxiety disorder  Plan: Treatment Plan Strengths/Abilities:  Intelligent, Intuitive, Willing to participate in therapy Treatment Preferences:  Outpatient Individual Therapy Statement of Needs:  Patient is to use CBT, mindfulness and coping skills to help manage and/or decrease symptoms associated with their diagnosis. Symptoms:  Depressed/Irritable mood, worry, social withdrawal Problems Addressed:  Depressive thoughts, Sadness, Sleep issues, etc. Long Term Goals:  Pt to reduce overall level, frequency, and intensity of the feelings of depression/anxiety as evidenced by decreased irritability, negative self talk, and helpless feelings from 6 to 7 days/week to 0 to 1 days/week, per client report, for at least 3 consecutive months.  Progress: 20% Short Term Goals:  Pt to verbally express understanding of the relationship between feelings of depression/anxiety and their impact on thinking patterns and behaviors.  Pt to verbalize an understanding of the role that distorted thinking plays in  creating fears, excessive worry, and ruminations.  Progress: 20% Target Date:  06/03/2023 Frequency:  Bi-weekly Modality:  Cognitive Behavioral Therapy Interventions by Therapist:  Therapist will use CBT, Mindfulness exercises, Coping skills and Referrals, as needed by client. Client has verbally approved this treatment plan.  Karie Kirks, [facility]

## 2022-12-28 ENCOUNTER — Encounter (INDEPENDENT_AMBULATORY_CARE_PROVIDER_SITE_OTHER): Payer: Self-pay

## 2022-12-29 ENCOUNTER — Ambulatory Visit: Payer: Medicare HMO | Admitting: Psychology

## 2023-01-05 ENCOUNTER — Ambulatory Visit: Payer: Medicare HMO | Admitting: Psychology

## 2023-01-05 ENCOUNTER — Ambulatory Visit (INDEPENDENT_AMBULATORY_CARE_PROVIDER_SITE_OTHER): Payer: Medicare HMO | Admitting: Psychology

## 2023-01-05 ENCOUNTER — Ambulatory Visit: Payer: 59 | Admitting: Neurology

## 2023-01-05 DIAGNOSIS — F331 Major depressive disorder, recurrent, moderate: Secondary | ICD-10-CM | POA: Diagnosis not present

## 2023-01-05 DIAGNOSIS — F411 Generalized anxiety disorder: Secondary | ICD-10-CM | POA: Diagnosis not present

## 2023-01-05 NOTE — Progress Notes (Signed)
[facility] Counselor/Therapist Progress Note  Patient ID: [name] [name], MRN: [id number],    Date: 01/05/2023  Time Spent: 45 mins  start time: 1100   end time:  1145  Treatment Type: Individual Therapy  Reported Symptoms: Pt presents for session, via Caregility video.  Pt grants consent for the session and stating he understands the limits of virtual sessions, stating his is in his home with no one else present.  I shared with pt that I am in my office with no one else here either.   Mental Status Exam: Appearance:  Casual     Behavior: Appropriate  Motor: Normal  Speech/Language:  Clear and Coherent  Affect: Depressed and Flat  Mood: depressed  Thought process: normal  Thought content:   WNL  Sensory/Perceptual disturbances:   WNL  Orientation: oriented to person, place, and situation  Attention: Good  Concentration: Fair  Memory: Recent;   Fair  Fund of knowledge:  Good  Insight:   Good  Judgment:  Good  Impulse Control: Good   Risk Assessment: Danger to Self:  No Self-injurious Behavior: No Danger to Others: No Duty to Warn:no Physical Aggression / Violence:No  Access to Firearms a concern: No  Gang Involvement:No   Subjective: Pt shares that "I am still not feeling great.  My anxiety has caused me to break out in hives.  I have had to take my hydroxyzine to help with the hives and the itching."  Pt shares that [name] [name] called [name] [name] and asked to come over to see them and she was crying.  "When she got here she would not say very much.  [name] told her that she was not willing to deal with her and [name] [name] went in the house.  I stayed in the garage with [name] [name] and we ended up yelling at each other and that was not good.  [name] [name] has been posting on [employer] hurtful things about me and [name] [name]."  Pt shares that he has been so upset from this situation with [name] [name] that he has been having trouble falling asleep.  Encouraged pt to consider taking his Hydroxyzine at bedtime to help  him fall asleep better at night.  Pt shares his BP has been pretty good lately; his BS is still a little higher than he wants them to be but is it not too bad.  He continues to take his [name] [name] and feels like it is helping him.  He would prefer to still be on Trulicity but he cannot get it.  [name] [name] continues to apply for jobs but she has not found one yet.  [name] [name], [name] [name], and pt went back to [name] [name]'s for lunch again and they enjoyed their time; after lunch they went to see the new movie, "[name] [name] and [name] [name]."  Pt shares that he has not had any doctors visits since our last session.  He has an appt with the [name] [name] study doctor next week and is scheduled to see his podiatrist later in the month as well, because of his diabetes.  Pt shares that he is still working on his self care activities but he feels they are less effective since he has been so upset with this situation with [name] [name].  Encouraged pt to continue with his self care activities and we will meet next week for a follow up session.  Interventions: Cognitive Behavioral Therapy  Diagnosis:Moderate episode of recurrent major depressive disorder (HCC)  Generalized anxiety disorder  Plan: Treatment Plan Strengths/Abilities:  Intelligent, Intuitive, Willing to participate  in therapy Treatment Preferences:  Outpatient Individual Therapy Statement of Needs:  Patient is to use CBT, mindfulness and coping skills to help manage and/or decrease symptoms associated with their diagnosis. Symptoms:  Depressed/Irritable mood, worry, social withdrawal Problems Addressed:  Depressive thoughts, Sadness, Sleep issues, etc. Long Term Goals:  Pt to reduce overall level, frequency, and intensity of the feelings of depression/anxiety as evidenced by decreased irritability, negative self talk, and helpless feelings from 6 to 7 days/week to 0 to 1 days/week, per client report, for at least 3 consecutive months.  Progress: 20% Short Term Goals:  Pt to  verbally express understanding of the relationship between feelings of depression/anxiety and their impact on thinking patterns and behaviors.  Pt to verbalize an understanding of the role that distorted thinking plays in creating fears, excessive worry, and ruminations.  Progress: 20% Target Date:  06/03/2023 Frequency:  Bi-weekly Modality:  Cognitive Behavioral Therapy Interventions by Therapist:  Therapist will use CBT, Mindfulness exercises, Coping skills and Referrals, as needed by client. Client has verbally approved this treatment plan.  Karie Kirks, [facility]

## 2023-01-06 NOTE — Progress Notes (Signed)
[facility] Summary Report / Loop Recorder 

## 2023-01-12 ENCOUNTER — Ambulatory Visit (INDEPENDENT_AMBULATORY_CARE_PROVIDER_SITE_OTHER): Payer: Medicare HMO | Admitting: Psychology

## 2023-01-12 DIAGNOSIS — F331 Major depressive disorder, recurrent, moderate: Secondary | ICD-10-CM

## 2023-01-12 DIAGNOSIS — F411 Generalized anxiety disorder: Secondary | ICD-10-CM | POA: Diagnosis not present

## 2023-01-12 NOTE — Progress Notes (Signed)
[facility] Counselor/Therapist Progress Note  Patient ID: [name] [name], MRN: [id number],    Date: 01/12/2023  Time Spent: 45 mins  start time: 1100   end time:  1145  Treatment Type: Individual Therapy  Reported Symptoms: Pt presents for session, via Caregility video.  Pt grants consent for the session and stating he understands the limits of virtual sessions, stating his is in his home with no one else present.  I shared with pt that I am in my office with no one else here either.   Mental Status Exam: Appearance:  Casual     Behavior: Appropriate  Motor: Normal  Speech/Language:  Clear and Coherent  Affect: Depressed and Flat  Mood: depressed  Thought process: normal  Thought content:   WNL  Sensory/Perceptual disturbances:   WNL  Orientation: oriented to person, place, and situation  Attention: Good  Concentration: Fair  Memory: Recent;   Fair  Fund of knowledge:  Good  Insight:   Good  Judgment:  Good  Impulse Control: Good   Risk Assessment: Danger to Self:  No Self-injurious Behavior: No Danger to Others: No Duty to Warn:no Physical Aggression / Violence:No  Access to Firearms a concern: No  Gang Involvement:No   Subjective: Pt shares that "I have been up and down since our last session.  We are still dealing with junk with [name] [name] and I am having issues with my medicines because I am in the "doughnut hole" as far as payment goes so I am worried about that.  I had to stop Trulicity and I will run out of Jardiance in about 2 wks."  Pt shares frustration that Congress does not seem to be concerned about this issue for Medicare recipients.  Pt shares, "I am on about 25 prescriptions so I am not sure what to do."  Pt shares he is very frustrated with this situation; he also says that his air conditioner is acting up and the technician is coming out today to check on it.  Pt shares that [name] [name] continues to post personal family info on [name] [name] and that continues  to be hurtful to pt and [name] [name].  [name] [name] continues to be the only person that talks with [name] [name] and [name] [name] told [name] [name] that she misses pt and [name] [name] and [name] [name] reminded [name] [name] that [name] [name] is the one who blocked pt and [name] [name].  [name] [name] did reach out to [name] [name] at work and [name] [name] did not want to talk to [name] while she was at work.  Pt continues to take his hydroxyzine as needed.  Pt shares his BP is now stable; his BS continues to be a challenge for him to get it controlled.  Pt is to see his endocrinologist later in August and he will ask her about options to help him get his readings more stable.  [name] [name] continues to apply for jobs and unemployment every week; she is still waiting on her first unemployment check.  Pt shares that he has been watching TV shows and doing chores around the house that allow him to feel useful.  Encouraged pt to continue with his self care activities and we will meet next week for a follow up session.  Interventions: Cognitive Behavioral Therapy  Diagnosis:Moderate episode of recurrent major depressive disorder (HCC)  Generalized anxiety disorder  Plan: Treatment Plan Strengths/Abilities:  Intelligent, Intuitive, Willing to participate in therapy Treatment Preferences:  Outpatient Individual Therapy Statement of Needs:  Patient is to use CBT, mindfulness and coping skills to help manage and/or  decrease symptoms associated with their diagnosis. Symptoms:  Depressed/Irritable mood, worry, social withdrawal Problems Addressed:  Depressive thoughts, Sadness, Sleep issues, etc. Long Term Goals:  Pt to reduce overall level, frequency, and intensity of the feelings of depression/anxiety as evidenced by decreased irritability, negative self talk, and helpless feelings from 6 to 7 days/week to 0 to 1 days/week, per client report, for at least 3 consecutive months.  Progress: 20% Short Term Goals:  Pt to verbally express understanding of the relationship between feelings of depression/anxiety and  their impact on thinking patterns and behaviors.  Pt to verbalize an understanding of the role that distorted thinking plays in creating fears, excessive worry, and ruminations.  Progress: 20% Target Date:  06/03/2023 Frequency:  Bi-weekly Modality:  Cognitive Behavioral Therapy Interventions by Therapist:  Therapist will use CBT, Mindfulness exercises, Coping skills and Referrals, as needed by client. Client has verbally approved this treatment plan.  Karie Kirks, [facility]

## 2023-01-17 ENCOUNTER — Ambulatory Visit: Payer: Medicare HMO | Admitting: Nurse Practitioner

## 2023-01-17 ENCOUNTER — Encounter: Payer: Self-pay | Admitting: Internal Medicine

## 2023-01-18 ENCOUNTER — Ambulatory Visit: Payer: Medicare HMO | Admitting: Podiatry

## 2023-01-19 ENCOUNTER — Ambulatory Visit (INDEPENDENT_AMBULATORY_CARE_PROVIDER_SITE_OTHER): Payer: Medicare HMO

## 2023-01-19 ENCOUNTER — Ambulatory Visit: Payer: Medicare HMO | Admitting: Psychology

## 2023-01-19 DIAGNOSIS — I639 Cerebral infarction, unspecified: Secondary | ICD-10-CM

## 2023-01-19 LAB — CUP PACEART REMOTE DEVICE CHECK
Date Time Interrogation Session: 20240821231203
Implantable Pulse Generator Implant Date: 20211008

## 2023-01-21 NOTE — Progress Notes (Deleted)
[name] [name] T. [name] Dutch, MD, CAQ Sports Medicine [facility] at [facility] [address] [place], [address]  Phone: [phone]  FAX: [phone]  [name] - 62 y.o. male  MRN [id number]  Date of Birth: May 06, 1961  Date: 01/23/2023  PCP: Hannah Beat, MD  Referral: Hannah Beat, MD  No chief complaint on file.  Subjective:   [name] [name] is a 62 y.o. very pleasant male patient with There is no height or weight on file to calculate BMI. who presents with the following:  [name] [name] is a well-known patient who I have known for many years.  He has multiple chronic medical problems.  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  He has had some persistent deficit, primarily with mental clarity over time.  This has improved somewhat over time.  Significant history for morbid obesity, but he has lost roughly 100 pounds or more in the last few years. There is no height or weight on file to calculate BMI.   HTN: Tolerating all medications without side effects Stable and at goal No CP, no sob. No HA.  BP Readings from Last 3 Encounters:  11/30/22 94/60  10/28/22 124/72  10/12/22 118/66    Basic Metabolic Panel:    Component Value Date/Time   NA 141 12/09/2022 1142   K 4.2 12/09/2022 1142   CL 104 12/09/2022 1142   CO2 23 12/09/2022 1142   BUN 29 (H) 12/09/2022 1142   CREATININE 1.24 12/09/2022 1142   GLUCOSE 134 (H) 12/09/2022 1142   GLUCOSE 83 10/15/2020 1100   CALCIUM 9.9 12/09/2022 1142    Lipids: Doing well, stable. Tolerating meds fine with no SE. Panel reviewed with patient.  Lipids: Lab Results  Component Value Date   CHOL 118 07/19/2022   Lab Results  Component Value Date   HDL 36 (L) 07/19/2022   Lab Results  Component Value Date   LDLCALC 51 07/19/2022   Lab Results  Component Value Date   TRIG 186 (H) 07/19/2022   Lab Results  Component Value Date   CHOLHDL 3.3 07/19/2022    Lab Results  Component Value  Date   ALT 35 07/19/2022   AST 20 07/19/2022   ALKPHOS 46 07/19/2022   BILITOT 0.4 07/19/2022    He also had very difficult to manage depression and anxiety as well as panic attacks.  He has been seeing Dr. Maryruth Bun, and he has been doing a lot better.  He also has had severe diabetic neuropathy.  He is on Lyrica 300 mg p.o. twice daily.  She also on Cymbalta 60.    Review of Systems is noted in the HPI, as appropriate  Objective:   There were no vitals taken for this visit.  GEN: No acute distress; alert,appropriate. PULM: Breathing comfortably in no respiratory distress PSYCH: Normally interactive.   Laboratory and Imaging Data:  Assessment and Plan:   ***

## 2023-01-23 ENCOUNTER — Ambulatory Visit: Payer: Medicare HMO | Admitting: Family Medicine

## 2023-01-23 DIAGNOSIS — E785 Hyperlipidemia, unspecified: Secondary | ICD-10-CM

## 2023-01-23 DIAGNOSIS — E1159 Type 2 diabetes mellitus with other circulatory complications: Secondary | ICD-10-CM

## 2023-01-23 DIAGNOSIS — I480 Paroxysmal atrial fibrillation: Secondary | ICD-10-CM

## 2023-01-23 DIAGNOSIS — I10 Essential (primary) hypertension: Secondary | ICD-10-CM

## 2023-01-23 DIAGNOSIS — E1142 Type 2 diabetes mellitus with diabetic polyneuropathy: Secondary | ICD-10-CM

## 2023-01-26 ENCOUNTER — Ambulatory Visit (INDEPENDENT_AMBULATORY_CARE_PROVIDER_SITE_OTHER): Payer: Medicare HMO | Admitting: Psychology

## 2023-01-26 ENCOUNTER — Ambulatory Visit: Payer: Medicare HMO | Admitting: Internal Medicine

## 2023-01-26 DIAGNOSIS — F331 Major depressive disorder, recurrent, moderate: Secondary | ICD-10-CM

## 2023-01-26 DIAGNOSIS — F411 Generalized anxiety disorder: Secondary | ICD-10-CM

## 2023-01-26 NOTE — Progress Notes (Signed)
[facility] Counselor/Therapist Progress Note  Patient ID: [name] [name], MRN: [id number],    Date: 01/26/2023  Time Spent: 45 mins  start time: 1100   end time:  1145  Treatment Type: Individual Therapy  Reported Symptoms: Pt presents for session, via Caregility video.  Pt grants consent for the session and stating he understands the limits of virtual sessions, stating his is in his home with no one else present.  I shared with pt that I am in my office with no one else here either.   Mental Status Exam: Appearance:  Casual     Behavior: Appropriate  Motor: Normal  Speech/Language:  Clear and Coherent  Affect: Depressed and Flat  Mood: depressed  Thought process: normal  Thought content:   WNL  Sensory/Perceptual disturbances:   WNL  Orientation: oriented to person, place, and situation  Attention: Good  Concentration: Fair  Memory: Recent;   Fair  Fund of knowledge:  Good  Insight:   Good  Judgment:  Good  Impulse Control: Good   Risk Assessment: Danger to Self:  No Self-injurious Behavior: No Danger to Others: No Duty to Warn:no Physical Aggression / Violence:No  Access to Firearms a concern: No  Gang Involvement:No   Pt wants to talk about a situation in our next session that involves a television show with Darlyn Read in the show "[place] 16" and a movie he was in.  Subjective: Pt shares that "I had to cancel last week because everyone in the house was dealing with a stomach virus.  [name] got epidural injections in her back yesterday and she is feeling much better.  I have been back and forth with my mood; I had been taking care of [name] before she got her injections."  We have not been able to leave the house very much because of the trouble with [name]'s back."  Pt shares he has been falling asleep very well but sometimes his dog will wake him up during the night.  He shares that he continues to stay up late "because I am often worried that I may not wake up  the next morning because of my history of strokes."  Pt shares that [name] continues to post unkind things about them on line.  [name] has texted and tried to call pt but pt has not answered the calls and has not responded to the texts.  [name] has been talking to [name] from time to time but they are short conversations because [name] will start getting mad and [name] will end the conversation when that starts.  [name] is now getting negative feedback from other family members, including her husband's side of the family.  Pt shares that he has been playing Spades on his phone and he has enjoyed playing chess as well in the past; [name] has been playing video games and she has been showing pt on their TV how to play them; he appreciates her taking time to play with him.  Pt continues to take his medications except for Trulicity because he cannot afford it and Jardiance because he is in the "dough nut hole with Medicare.  [name] is also missing two of her medications as well for the same reasons.  The air conditioning technician did come out to the house and fixed their unit and it is working fine now.  Pt continues to take his hydroxyzine as needed.  Pt needs to schedule is Medicare annual wellness visit soon.  Pt shares his BP readings  continue to be stable; his BS readings are not as consistent because he is not able to take the medications that he cannot afford.  [name] continues to apply for jobs and unemployment every week.  Pt shares that he has been watching TV shows and doing chores around the house that allow him to feel useful.  Encouraged pt to continue with his self care activities and we will meet next week for a follow up session.  Interventions: Cognitive Behavioral Therapy  Diagnosis:Moderate episode of recurrent major depressive disorder (HCC)  Generalized anxiety disorder  Plan: Treatment Plan Strengths/Abilities:  Intelligent, Intuitive, Willing to participate in therapy Treatment Preferences:   Outpatient Individual Therapy Statement of Needs:  Patient is to use CBT, mindfulness and coping skills to help manage and/or decrease symptoms associated with their diagnosis. Symptoms:  Depressed/Irritable mood, worry, social withdrawal Problems Addressed:  Depressive thoughts, Sadness, Sleep issues, etc. Long Term Goals:  Pt to reduce overall level, frequency, and intensity of the feelings of depression/anxiety as evidenced by decreased irritability, negative self talk, and helpless feelings from 6 to 7 days/week to 0 to 1 days/week, per client report, for at least 3 consecutive months.  Progress: 20% Short Term Goals:  Pt to verbally express understanding of the relationship between feelings of depression/anxiety and their impact on thinking patterns and behaviors.  Pt to verbalize an understanding of the role that distorted thinking plays in creating fears, excessive worry, and ruminations.  Progress: 20% Target Date:  06/03/2023 Frequency:  Bi-weekly Modality:  Cognitive Behavioral Therapy Interventions by Therapist:  Therapist will use CBT, Mindfulness exercises, Coping skills and Referrals, as needed by client. Client has verbally approved this treatment plan.  Karie Kirks, [facility]

## 2023-01-31 ENCOUNTER — Ambulatory Visit (INDEPENDENT_AMBULATORY_CARE_PROVIDER_SITE_OTHER): Payer: Medicare HMO | Admitting: Podiatry

## 2023-01-31 ENCOUNTER — Encounter: Payer: Self-pay | Admitting: Podiatry

## 2023-01-31 DIAGNOSIS — E1159 Type 2 diabetes mellitus with other circulatory complications: Secondary | ICD-10-CM | POA: Diagnosis not present

## 2023-01-31 DIAGNOSIS — M79676 Pain in unspecified toe(s): Secondary | ICD-10-CM

## 2023-01-31 DIAGNOSIS — B351 Tinea unguium: Secondary | ICD-10-CM

## 2023-01-31 DIAGNOSIS — E1142 Type 2 diabetes mellitus with diabetic polyneuropathy: Secondary | ICD-10-CM

## 2023-01-31 NOTE — Progress Notes (Signed)
This patient returns to my office for at risk foot care.  This patient requires this care by a professional since this patient will be at risk due to having diabetes and vascular disease. This patient is unable to cut nails himself since the patient cannot reach his nails.These nails are painful walking and wearing shoes.  This patient presents for at risk foot care today.  General Appearance  Alert, conversant and in no acute stress.  Vascular  Dorsalis pedis and posterior tibial  pulses are  weakly palpable  bilaterally.  Capillary return is within normal limits  bilaterally. Temperature is within normal limits  bilaterally.  Neurologic  Senn-Weinstein monofilament wire test within normal limits  bilaterally. Muscle power within normal limits bilaterally.  Nails Thick disfigured discolored nails with subungual debris  from hallux to fifth toes bilaterally. No evidence of bacterial infection or drainage bilaterally.  Orthopedic  No limitations of motion  feet .  No crepitus or effusions noted.  No bony pathology or digital deformities noted.  Skin  normotropic skin with no porokeratosis noted bilaterally.  No signs of infections or ulcers noted.     Onychomycosis  Pain in right toes  Pain in left toes  Consent was obtained for treatment procedures.   Mechanical debridement of nails 1-5  bilaterally performed with a nail nipper.  Filed with dremel without incident.    Return office visit   2 months                   Told patient to return for periodic foot care and evaluation due to potential at risk complications. Patient says he has talked to his insurance and he is entitled to six visits per year.  Therefore he requests returning in 2 months.   Helane Gunther DPM

## 2023-01-31 NOTE — Progress Notes (Signed)
[facility] Summary Report / Loop Recorder 

## 2023-02-02 ENCOUNTER — Ambulatory Visit (INDEPENDENT_AMBULATORY_CARE_PROVIDER_SITE_OTHER): Payer: Medicare HMO | Admitting: Psychology

## 2023-02-02 DIAGNOSIS — F411 Generalized anxiety disorder: Secondary | ICD-10-CM

## 2023-02-02 DIAGNOSIS — F331 Major depressive disorder, recurrent, moderate: Secondary | ICD-10-CM
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
## 2023-02-07 ENCOUNTER — Ambulatory Visit: Payer: Medicare HMO | Attending: Cardiology | Admitting: Cardiology

## 2023-02-07 ENCOUNTER — Encounter: Payer: Self-pay | Admitting: Cardiology

## 2023-02-07 VITALS — BP 120/68 | HR 76 | Ht 70.0 in | Wt 301.0 lb

## 2023-02-07 DIAGNOSIS — E1169 Type 2 diabetes mellitus with other specified complication: Secondary | ICD-10-CM

## 2023-02-07 DIAGNOSIS — I10 Essential (primary) hypertension: Secondary | ICD-10-CM

## 2023-02-07 DIAGNOSIS — E785 Hyperlipidemia, unspecified: Secondary | ICD-10-CM | POA: Diagnosis not present

## 2023-02-07 DIAGNOSIS — I480 Paroxysmal atrial fibrillation: Secondary | ICD-10-CM

## 2023-02-07 DIAGNOSIS — Z7984 Long term (current) use of oral hypoglycemic drugs: Secondary | ICD-10-CM | POA: Diagnosis not present

## 2023-02-07 DIAGNOSIS — Z87891 Personal history of nicotine dependence: Secondary | ICD-10-CM | POA: Diagnosis not present

## 2023-02-07 NOTE — Patient Instructions (Signed)
Medication Instructions:  Your physician recommends that you continue on your current medications as directed. Please refer to the Current Medication list given to you today.  *If you need a refill on your cardiac medications before your next appointment, please call your pharmacy*  Lab Work: -None ordered  Testing/Procedures: -None ordered  Follow-Up: At [facility], you and your health needs are our priority.  As part of our continuing mission to provide you with exceptional heart care, we have created designated Provider Care Teams.  These Care Teams include your primary Cardiologist (physician) and Advanced Practice Providers (APPs -  Physician Assistants and Nurse Practitioners) who all work together to provide you with the care you need, when you need it.  Your next appointment:   3 month(s)  Provider:   Yvonne Kendall, MD    Other Instructions -None

## 2023-02-07 NOTE — Progress Notes (Signed)
Cardiology Office Note:  .   Date:  02/07/2023  ID:  [name] [name], DOB 09-21-1960, MRN [id number] PCP: Hannah Beat, MD  [facility] Providers Cardiologist:  Yvonne Kendall, MD    History of Present Illness: .   [name] [name] is a 62 y.o. male with past medical history of paroxysmal atrial fibrillation, peripheral artery disease, hypertension, hyperlipidemia, type 2 diabetes, and stroke (01/2020), who is here today to follow-up on his paroxysmal atrial fibrillation.  Previously had undergone myocardial PET/CT and ABIs for further evaluation of chest and leg pains.  Stress test was low risk without evidence of ischemia or scar.  LVEF is mildly reduced at rest but normalized with stress.  Myocardial blood flow reserve was normal.  Lower extremity vascular studies did not show any significant narrowing in either leg.  Subsequent echo was normal other than mild diastolic dysfunction.  He was last seen in clinic 12/17/2022 by Dr. Okey Dupre.  At that time he stated he was doing well up until a few weeks when his dapagliflozin had to be discontinued due to cost.  He stated this made his blood pressure, he was already had several changes made to his blood pressure medications by his primary care provider.  He was started on empagliflozin instead and noted that his blood pressures were gradually normalizing.  He had complaints of leg edema that had not returned back to normal.  He also had occasional twinges of pain in his chest he attributed to soreness related to his ILR.  He was sent for lab work was continued on his current medications.  He returns to clinic today accompanied by his wife overall stating that he has been doing fairly well from the cardiac perspective.  He denies any chest pain, shortness of breath, palpitations, or worsening peripheral edema.  He has little disheartened today as he has been without [place], [place], or Trulicity due to cost constraints.  His blood pressure has  been well-controlled as he continues to monitor his pressures at home.  He does have questions about previous testing that he had had completed and recent labs that he had completed.  He states that his leg edema has been stable as it is a chronic issue as he has had venous insufficiency over a long period of time.  He states that he has been compliant with his current medication regimen.  Denies any hospitalizations or visits to the emergency department  ROS: 10 point review of systems has been completed and considered negative with exception of what is been listed in the HPI  Studies Reviewed: Marland Kitchen   EKG Interpretation Date/Time:  Tuesday February 07 2023 14:19:53 EDT Ventricular Rate:  76 PR Interval:  188 QRS Duration:  108 QT Interval:  380 QTC Calculation: 427 R Axis:   -62  Text Interpretation: Normal sinus rhythm Left axis deviation Incomplete right bundle branch block When compared with ECG of 30-Nov-2022 15:31, Premature ventricular complexes are no longer Present Incomplete right bundle branch block is now Present Minimal criteria for Anteroseptal infarct are no longer Present Confirmed by Charlsie Quest ([id number]) on 02/07/2023 2:53:39 PM    TTE (09/20/2022): Normal LV size and wall thickness.  LVEF 55-60% with grade 1 diastolic dysfunction.  Normal RV size and function.  Normal biatrial size.  No pericardial effusion.  No significant valvular abnormalities.  Normal CVP.   ABIs and arterial Dopplers (08/12/2022): Bilateral lower extremity arteries widely patent with no significant plaque.  ABIs 1.28 on the right and 1.24  on the left.  TBI 0.47 on the right and 0.59 on the left.   Myocardial PET/CT (08/09/2022): Normal, low risk study without evidence of ischemia or scar.  Mildly reduced LVEF at rest, which normalizes with stress.  Normal myocardial blood flow reserve.  Coronary artery calcifications present. Risk Assessment/Calculations:    CHA2DS2-VASc Score = 5   This indicates a 7.2%  annual risk of stroke. The patient's score is based upon: CHF History: 0 HTN History: 1 Diabetes History: 1 Stroke History: 2 Vascular Disease History: 1 Age Score: 0 Gender Score: 0            Physical Exam:   VS:  BP 120/68 (BP Location: Left Arm, Patient Position: Sitting, Cuff Size: Normal)   Pulse 76   Ht 5\' 10"  (1.778 m)   Wt (!) 301 lb (136.5 kg)   SpO2 96%   BMI 43.19 kg/m    Wt Readings from Last 3 Encounters:  02/07/23 (!) 301 lb (136.5 kg)  11/30/22 (!) 301 lb (136.5 kg)  10/28/22 (!) 302 lb (137 kg)    GEN: Well nourished, well developed in no acute distress NECK: Unable to assess JVD due to body habitus; No carotid bruits CARDIAC: RRR, no murmurs, rubs, gallops RESPIRATORY:  Clear to auscultation without rales, wheezing or rhonchi  ABDOMEN: Soft, non-tender, non-distended EXTREMITIES:  Trace pretibial edema to BLE; No deformity;   ASSESSMENT AND PLAN: .   Paroxysmal atrial fibrillation which is in sinus rhythm today.  EKG reveals sinus rhythm,, left axis deviation, incomplete right bundle branch block with a rate of 76 with ST and T wave inversion noted that is unchanged from prior study even back from October 2023.  He has had no significant arrhythmias including ventricular tachycardia or atrial fibrillation noted on his most recent ILR interrogation from August.  He is continued on anticoagulation per the Librexia-AF study.  He has also not noted any bleeding issues since being on this study no blood noted in his urine or stool.  He is also continued on diltiazem 30 mg as needed for heart rate greater than 100 bpm.  Hypertension with blood pressure today 120/68.  Blood pressures have been stable.  He is continued on his current medication regimen hydrochlorothiazide 25 mg every lisinopril 10 mg every evening.  Blood pressures have remained stable at home.  He is encouraged to continue to monitor his pressures 1 to 2 hours post medication administration.  Mixed  hyperlipidemia associated with type 2 diabetes where he is continued on rosuvastatin with ongoing management of his diabetes per his PCP.  Morbid obesity with a BMI of 43.1 with multiple comorbidities.  Weight loss is encouraged with increasing his activity and decreasing his caloric intake.  Former tobacco abuse where he is recently stopped smoking and is now without Chantix.  He has been congratulated and encouraged to continue his efforts on smoking cessation.       Dispo: Patient to return to clinic to see MD/APP in three months or sooner if needed  Signed, Shemika Robbs, NP

## 2023-02-08 ENCOUNTER — Ambulatory Visit: Payer: Medicare HMO | Admitting: Family Medicine

## 2023-02-09 ENCOUNTER — Ambulatory Visit (INDEPENDENT_AMBULATORY_CARE_PROVIDER_SITE_OTHER): Payer: Medicare HMO | Admitting: Psychology

## 2023-02-09 DIAGNOSIS — F411 Generalized anxiety disorder: Secondary | ICD-10-CM

## 2023-02-09 DIAGNOSIS — F331 Major depressive disorder, recurrent, moderate: Secondary | ICD-10-CM
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
## 2023-02-16 ENCOUNTER — Ambulatory Visit: Payer: Medicare HMO | Admitting: Psychology

## 2023-02-20 ENCOUNTER — Ambulatory Visit (INDEPENDENT_AMBULATORY_CARE_PROVIDER_SITE_OTHER): Payer: Medicare HMO

## 2023-02-20 DIAGNOSIS — I639 Cerebral infarction, unspecified: Secondary | ICD-10-CM | POA: Diagnosis not present

## 2023-02-21 LAB — CUP PACEART REMOTE DEVICE CHECK
Date Time Interrogation Session: 20240923231148
Implantable Pulse Generator Implant Date: 20211008

## 2023-02-22 ENCOUNTER — Encounter: Payer: Self-pay | Admitting: Family Medicine

## 2023-02-23 ENCOUNTER — Ambulatory Visit: Payer: Medicare HMO | Admitting: Psychology

## 2023-02-23 MED ORDER — PREGABALIN 300 MG PO CAPS: ORAL_CAPSULE | ORAL | 1 refills | Status: DC

## 2023-02-23 NOTE — Telephone Encounter (Signed)
Reached out to pharmacy they have a script from 3-9 but it was only for 180 with one refill. They do not have any record of one from that day for 180 with 3 refills that our records show. Patient will need new script sent into mail order. I have sent patient message to let them know as well.

## 2023-02-26 NOTE — Progress Notes (Deleted)
    [name] Mirabella T. Uri Turnbough, MD, CAQ Sports Medicine [facility] [address] [place], [address]  Phone: [phone]  FAX: [phone]  [name] [name] - 62 y.o. male  MRN [id number]  Date of Birth: 1960/12/07  Date: 03/01/2023  PCP: Hannah Beat, MD  Referral: Hannah Beat, MD  No chief complaint on file.  Subjective:   [name] [name] is a 62 y.o. very pleasant male patient with There is no height or weight on file to calculate BMI. who presents with the following:  [name] [name] is a very well-known patient with many medical problems.  He does have a history of multiple ischemic strokes.  He is morbidly obese, but he has lost well over 150 pounds.  He does have severe diabetic neuropathy, which has been better controlled with Lyrica at high doses.  He also feels as if he is doing better with Cymbalta 60 mg p.o. twice daily. -He is on Lyrica 300 mg p.o. twice daily    Diabetes Mellitus: Tolerating Medications: yes Compliance with diet: fair, There is no height or weight on file to calculate BMI. Exercise: minimal / intermittent Avg blood sugars at home: not checking Foot problems: none Hypoglycemia: none No nausea, vomitting, blurred vision, polyuria.  Lab Results  Component Value Date   HGBA1C 6.6 (A) 10/28/2022   HGBA1C 6.7 (A) 06/10/2022   HGBA1C 6.0 (A) 03/18/2022   Lab Results  Component Value Date   MICROALBUR 2.82 (H) 07/27/2007   LDLCALC 51 07/19/2022   CREATININE 1.24 12/09/2022    Wt Readings from Last 3 Encounters:  02/07/23 (!) 301 lb (136.5 kg)  11/30/22 (!) 301 lb (136.5 kg)  10/28/22 (!) 302 lb (137 kg)    HTN: Tolerating all medications without side effects Stable and at goal No CP, no sob. No HA.  BP Readings from Last 3 Encounters:  02/07/23 120/68  11/30/22 94/60  10/28/22 124/72    Basic Metabolic Panel:    Component Value Date/Time   NA 141 12/09/2022 1142   K 4.2 12/09/2022 1142   CL 104  12/09/2022 1142   CO2 23 12/09/2022 1142   BUN 29 (H) 12/09/2022 1142   CREATININE 1.24 12/09/2022 1142   GLUCOSE 134 (H) 12/09/2022 1142   GLUCOSE 83 10/15/2020 1100   CALCIUM 9.9 12/09/2022 1142    Lipids: Doing well, stable. Tolerating meds fine with no SE. Panel reviewed with patient.  Lipids: Lab Results  Component Value Date   CHOL 118 07/19/2022   Lab Results  Component Value Date   HDL 36 (L) 07/19/2022   Lab Results  Component Value Date   LDLCALC 51 07/19/2022   Lab Results  Component Value Date   TRIG 186 (H) 07/19/2022   Lab Results  Component Value Date   CHOLHDL 3.3 07/19/2022    Lab Results  Component Value Date   ALT 35 07/19/2022   AST 20 07/19/2022   ALKPHOS 46 07/19/2022   BILITOT 0.4 07/19/2022     Review of Systems is noted in the HPI, as appropriate  Objective:   There were no vitals taken for this visit.  GEN: No acute distress; alert,appropriate. PULM: Breathing comfortably in no respiratory distress PSYCH: Normally interactive.   Laboratory and Imaging Data:  Assessment and Plan:   ***

## 2023-03-01 ENCOUNTER — Ambulatory Visit: Payer: Medicare HMO | Admitting: Family Medicine

## 2023-03-01 DIAGNOSIS — E1169 Type 2 diabetes mellitus with other specified complication: Secondary | ICD-10-CM

## 2023-03-01 DIAGNOSIS — E1142 Type 2 diabetes mellitus with diabetic polyneuropathy: Secondary | ICD-10-CM

## 2023-03-01 DIAGNOSIS — I10 Essential (primary) hypertension: Secondary | ICD-10-CM

## 2023-03-01 DIAGNOSIS — E1159 Type 2 diabetes mellitus with other circulatory complications: Secondary | ICD-10-CM

## 2023-03-01 DIAGNOSIS — I639 Cerebral infarction, unspecified: Secondary | ICD-10-CM

## 2023-03-01 DIAGNOSIS — I480 Paroxysmal atrial fibrillation: Secondary | ICD-10-CM

## 2023-03-02 ENCOUNTER — Ambulatory Visit: Payer: Medicare HMO | Admitting: Psychology

## 2023-03-02 DIAGNOSIS — F331 Major depressive disorder, recurrent, moderate: Secondary | ICD-10-CM

## 2023-03-02 DIAGNOSIS — F411 Generalized anxiety disorder: Secondary | ICD-10-CM | POA: Diagnosis not present
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
## 2023-03-06 ENCOUNTER — Telehealth: Payer: Medicare HMO | Admitting: Behavioral Health

## 2023-03-07 NOTE — Progress Notes (Signed)
[facility] Summary Report / Loop Recorder 

## 2023-03-09 ENCOUNTER — Ambulatory Visit: Payer: Medicare HMO | Admitting: Psychology

## 2023-03-13 ENCOUNTER — Telehealth: Payer: Medicare HMO | Admitting: Behavioral Health

## 2023-03-13 ENCOUNTER — Encounter: Payer: Self-pay | Admitting: Behavioral Health

## 2023-03-13 DIAGNOSIS — F331 Major depressive disorder, recurrent, moderate: Secondary | ICD-10-CM

## 2023-03-13 DIAGNOSIS — F419 Anxiety disorder, unspecified: Secondary | ICD-10-CM

## 2023-03-13 DIAGNOSIS — F411 Generalized anxiety disorder: Secondary | ICD-10-CM

## 2023-03-13 DIAGNOSIS — F32A Depression, unspecified: Secondary | ICD-10-CM

## 2023-03-13 NOTE — Progress Notes (Signed)
[facility]  Patient ID: [name] [name],  MRN: [id number]  PCP: Hannah Beat, MD  Date of Evaluation: 03/13/2023 Time spent:30 minutes  Chief Complaint:  Chief Complaint   Family Problem; Depression; Anxiety; Follow-up; Medication Problem; Patient Education; Stress     Virtual Visit via Video Note   I connected with pt @ on 01/04/23 at  11:00 AM EDT by a video enabled telemedicine application and verified that I am speaking with the correct person using two identifiers.   I discussed the limitations of evaluation and management by telemedicine and the availability of in person appointments. The patient expressed understanding and agreed to proceed.   I discussed the assessment and treatment plan with the patient. The patient was provided an opportunity to ask questions and all were answered. The patient agreed with the plan and demonstrated an understanding of the instructions.   The patient was advised to call back or seek an in-person evaluation if the symptoms worsen or if the condition fails to improve as anticipated.   I provided 30 minutes of non-face-to-face time during this encounter.  The patient was located at home.  The provider was located at [facility].     Joan Flores, NP    HISTORY/CURRENT STATUS: HPI "[name] [name]", 62 year old male presents to this office via video visit for  follow up and medication management.  Very flat today. Says he feels like, "I have went down another rabbit hole and cannot get out this time. Says his relationship with his daughter has been strained which may be the trigger. He is requesting another medication or adjustment.  Still struggles with anxiety at times.  Numerically rates his anxiety today at 3/10, and depression at 7/10.  He is sleeping 7 to 8 hours per night.  He denies any history of mania, no psychosis, no auditory or visual hallucinations.  Denies SI or HI.  Has strong family support from his wife and  daughters.   Past psychiatric medication trials: Klonopin Prozac Duloxetine                Individual Medical History/ Review of Systems: Changes? :No   Allergies: Cashew nut oil, Pistachio nut (diagnostic), Latex, Tape, Betadine [povidone iodine], Lidocaine, and Penicillins  Current Medications:  Current Outpatient Medications:    acetaminophen (TYLENOL) 500 MG tablet, Take 1,000 mg by mouth 2 (two) times daily as needed (for pain)., Disp: , Rfl:    Alcohol Swabs (B-D SINGLE USE SWABS REGULAR) PADS, Use to check blood sugar daily, Disp: 100 each, Rfl: 3   Blood Glucose Monitoring Suppl (TRUE METRIX AIR GLUCOSE METER) w/Device KIT, 1 each by Does not apply route as directed., Disp: 1 kit, Rfl: 0   cetirizine (ZYRTEC) 10 MG tablet, Take 10 mg by mouth daily., Disp: , Rfl:    clonazePAM (KLONOPIN) 0.5 MG tablet, TAKE 1/2 TO 1 TABLET(0.25 TO 0.5 MG) BY MOUTH TWICE DAILY AS NEEDED FOR ANXIETY, Disp: 60 tablet, Rfl: 0   diltiazem (CARDIZEM) 30 MG tablet, TAKE 1 TABLET BY MOUTH EVERY 4 HOURS AS NEEDED FOR HEART RATE>100., Disp: 90 tablet, Rfl: 0   DULoxetine (CYMBALTA) 60 MG capsule, Take 1 capsule (60 mg total) by mouth 2 (two) times daily., Disp: 180 capsule, Rfl: 1   EPINEPHrine 0.3 mg/0.3 mL IJ SOAJ injection, Inject 0.3 mg into the muscle as needed for anaphylaxis., Disp: 2 each, Rfl: 0   FLUoxetine (PROZAC) 40 MG capsule, Take 2 capsules (80 mg total) by mouth daily., Disp: 180  capsule, Rfl: 1   glucose blood (TRUE METRIX BLOOD GLUCOSE TEST) test strip, Use to check blood sugar once daily, Disp: 100 each, Rfl: 3   hydrochlorothiazide (HYDRODIURIL) 25 MG tablet, Take 1 tablet (25 mg total) by mouth daily., Disp: 90 tablet, Rfl: 3   hydrOXYzine (ATARAX) 10 MG tablet, Take 10 mg by mouth 2 (two) times daily as needed., Disp: , Rfl:    lisinopril (ZESTRIL) 10 MG tablet, Take 1 tablet (10 mg total) by mouth daily., Disp: 90 tablet, Rfl: 3   metFORMIN (GLUCOPHAGE-XR) 500 MG 24 hr  tablet, Take 4 tablets (2,000 mg total) by mouth daily in the afternoon., Disp: 360 tablet, Rfl: 3   Multiple Vitamin (MULTIVITAMIN) tablet, Take 1 tablet by mouth daily., Disp: , Rfl:    pantoprazole (PROTONIX) 40 MG tablet, TAKE 1 TABLET(40 MG) BY MOUTH DAILY, Disp: 90 tablet, Rfl: 3   pentoxifylline (TRENTAL) 400 MG CR tablet, Take 1 tablet (400 mg total) by mouth 3 (three) times daily with meals., Disp: 270 tablet, Rfl: 3   pregabalin (LYRICA) 300 MG capsule, TAKE 1 CAPSULE(300 MG) BY MOUTH TWICE DAILY, Disp: 180 capsule, Rfl: 1   repaglinide (PRANDIN) 0.5 MG tablet, Take 1 tablet (0.5 mg total) by mouth 2 (two) times daily before a meal., Disp: 180 tablet, Rfl: 3   rOPINIRole (REQUIP) 0.5 MG tablet, TAKE 1 TABLET(0.5 MG) BY MOUTH TWICE DAILY, Disp: 180 tablet, Rfl: 3   rosuvastatin (CRESTOR) 20 MG tablet, TAKE 1 TABLET(20 MG) BY MOUTH AT BEDTIME, Disp: 90 tablet, Rfl: 3   STUDY - LIBREXIA-AF - apixaban 5 mg or placebo capsule (PI-Sethi), Take 1 capsule (5 mg total) by mouth 2 (two) times daily. Take at approximately the same time of day with or without food. Please bring bottle back with you to every visit; do not discard bottle. Please contact [facility] for any questions or concerns regarding this medication., Disp: 280 capsule, Rfl: 0   STUDY - LIBREXIA-AF - [id number] (Milvexian) 100 mg or placebo tablet (PI-Sethi), Take 1 tablet by mouth 2 (two) times daily. Take at approximately the same time of day with or without food. Please bring bottle back with you to every visit; do not discard bottle. Please contact [facility] for any questions or concerns regarding this medication., Disp: 280 tablet, Rfl: 0   TRUEplus Lancets 33G MISC, Use to check blood sugar 2 times a day, Disp: 200 each, Rfl: 3 Medication Side Effects: none  Family Medical/ Social History: Changes? No  MENTAL HEALTH EXAM:  There were no vitals taken for this visit.There is no height or  weight on file to calculate BMI.  General Appearance: Casual, Neat, and Well Groomed  Eye Contact:  Good  Speech:  Clear and Coherent  Volume:  Normal  Mood:  Anxious, Depressed, and Dysphoric  Affect:  Congruent, Depressed, Flat, and Anxious  Thought Process:  Coherent  Orientation:  Full (Time, Place, and Person)  Thought Content: Logical   Suicidal Thoughts:  No  Homicidal Thoughts:  No  Memory:  WNL  Judgement:  Good  Insight:  Good  Psychomotor Activity:  Normal  Concentration:  Concentration: Good  Recall:  Good  Fund of Knowledge: Good  Language: Good  Assets:  Desire for Improvement  ADL's:  Intact  Cognition: WNL  Prognosis:  Good    DIAGNOSES: No diagnosis found.  Receiving Psychotherapy: Yes    RECOMMENDATIONS:   Greater than 50% of 30  min video visit with patient  was spent on counseling and coordination of care. Discussed his decline with anxiety and depression. Talked about his relationship problems with his daughter being the trigger.  He is requesting medication change or adjustment. He is feeling well enough to request no changes to his medication regimen. He will continue to see Maggie Font  for therapy.    Pt will continue:  To start Wellbutrin 150 mg XL daily.  Cymbalta 60 twice daily mg daily To continue Prozac 40 mg twice daily  Continue Klonopin take 1/2 to 1 tablet twice daily for severe anxiety.  Will report worsening symptoms or side effects promptly Will follow-up in 6 weeks to reassess Provided emergency contact information Discussed potential benefits, risk, and side effects of benzodiazepines to include potential risk of tolerance and dependence, as well as possible drowsiness.  Advised patient not to drive if experiencing drowsiness and to take lowest possible effective dose to minimize risk of dependence and tolerance.  Reviewed PDMP    Joan Flores, NP

## 2023-03-14 ENCOUNTER — Telehealth: Payer: Self-pay | Admitting: Behavioral Health

## 2023-03-14 MED ORDER — BUPROPION HCL ER (XL) 150 MG PO TB24: 150.0000 mg | ORAL_TABLET | Freq: Every day | ORAL | 1 refills | Status: DC

## 2023-03-14 NOTE — Telephone Encounter (Signed)
Pt's wife called to schedule follow up appt @1 :01p.  She said at yesterday's appt with Arlys John, pt was told to start Wellbutrin, but no script was sent in.   Send to   [id number] [place], [place] - [address] AT [facility] OF [address] & [address] [place] [address] Phone: [phone]  Fax: [phone]   Next appt 12/3

## 2023-03-16 ENCOUNTER — Ambulatory Visit: Payer: Medicare HMO | Admitting: Psychology

## 2023-03-16 DIAGNOSIS — F331 Major depressive disorder, recurrent, moderate: Secondary | ICD-10-CM

## 2023-03-16 DIAGNOSIS — F411 Generalized anxiety disorder: Secondary | ICD-10-CM | POA: Diagnosis not present
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
## 2023-03-21 ENCOUNTER — Ambulatory Visit: Payer: Medicare HMO | Admitting: Internal Medicine

## 2023-03-21 ENCOUNTER — Encounter: Payer: Self-pay | Admitting: Internal Medicine

## 2023-03-21 VITALS — BP 120/80 | HR 94 | Ht 70.0 in | Wt 307.0 lb

## 2023-03-21 DIAGNOSIS — Z7984 Long term (current) use of oral hypoglycemic drugs: Secondary | ICD-10-CM | POA: Diagnosis not present

## 2023-03-21 DIAGNOSIS — E1159 Type 2 diabetes mellitus with other circulatory complications: Secondary | ICD-10-CM

## 2023-03-21 DIAGNOSIS — E1142 Type 2 diabetes mellitus with diabetic polyneuropathy: Secondary | ICD-10-CM

## 2023-03-21 LAB — POCT GLYCOSYLATED HEMOGLOBIN (HGB A1C): Hemoglobin A1C: 7.8 % — AB (ref 4.0–5.6)

## 2023-03-21 MED ORDER — METFORMIN HCL ER 500 MG PO TB24
2000.0000 mg | ORAL_TABLET | Freq: Every day | ORAL | 3 refills | Status: DC
  Filled 2023-06-12: qty 360, 90d supply, fill #0

## 2023-03-21 MED ORDER — REPAGLINIDE 0.5 MG PO TABS: 1.0000 mg | ORAL_TABLET | Freq: Two times a day (BID) | ORAL | 3 refills | Status: DC

## 2023-03-21 NOTE — Patient Instructions (Addendum)
-   Increase  Repaglinide 0.5 mg , 2 tablets before Brunch and Supper  - Continue Metformin 500 mg XR , 4 tablets daily     HOW TO TREAT LOW BLOOD SUGARS (Blood sugar LESS THAN 70 MG/DL) Please follow the RULE OF 15 for the treatment of hypoglycemia treatment (when your (blood sugars are less than 70 mg/dL)   STEP 1: Take 15 grams of carbohydrates when your blood sugar is low, which includes:  3-4 GLUCOSE TABS  OR 3-4 OZ OF JUICE OR REGULAR SODA OR ONE TUBE OF GLUCOSE GEL    STEP 2: RECHECK blood sugar in 15 MINUTES STEP 3: If your blood sugar is still low at the 15 minute recheck --> then, go back to STEP 1 and treat AGAIN with another 15 grams of carbohydrates.

## 2023-03-21 NOTE — Progress Notes (Signed)
Name: [name] [name]  Age/ Sex: 62 y.o., male   MRN/ DOB: [id number], [birth year]     PCP: Hannah Beat, MD   Reason for Endocrinology Evaluation: Type 2 Diabetes Mellitus  Initial Endocrine Consultative Visit: 03/01/2019    PATIENT IDENTIFIER: [name] [name] is a 62 y.o. male with a past medical history of HTN, T2DM,Hx CVA. The patient has followed with Endocrinology clinic since 03/01/2019 for consultative assistance with management of his diabetes.  DIABETIC HISTORY:  [name] [name] was diagnosed with DM 2010, patient intolerant to bromocriptine. His hemoglobin A1c has ranged from 5.9% in 2022, peaking at 10.7% in 2020.   The patient has been followed up with Dr. Everardo All between 2020 until April 2023  Stopped [name] 07/2022 due to cost issues.  Patient did not qualify for patient assistance  Off Trulicity and SGLT2 inhibitors by 10/2022  SUBJECTIVE:   During the last visit (10/27/2021): A1c 6.6%   Today (03/21/2023): [name] [name] is here for follow-up on diabetes management. He is accompanied by his spouse today. He checks his blood sugars 1 times daily. The patient has not had hypoglycemic episodes since the last clinic visit.  No glucose meter.   Patient follows with behavioral health for depression Had a follow-up with cardiology 01/2023 for paroxysmal A-fib, PAD and HTN      He eats Brunch and Supper , snacks after dinner   Denies nausea or vomiting  Denies constipation or diarrhea    HOME DIABETES REGIMEN:  Metformin 500 mg XR 4 tabs daily  Repaglinide 0.5 mg , 1 tab TID     Statin: Yes ACE-I/ARB: Yes   METER DOWNLOAD SUMMARY:  90 day average 188 mg/dL   322 -025  DIABETIC COMPLICATIONS: Microvascular complications:  Neuropathy Denies: CKD Last Eye Exam: Completed 03/09/2021  Macrovascular complications:  CVA Denies: CAD, PVD   HISTORY:  Past Medical History:  Past Medical History:  Diagnosis Date   Angio-edema    Anxiety    Depression     Diabetes mellitus type II    type 2   GERD (gastroesophageal reflux disease)    Headache    migraine   History of kidney stones    HLD (hyperlipidemia)    HTN (hypertension)    Morbid obesity (HCC)    Paroxysmal atrial fibrillation (HCC)    Restless legs syndrome (RLS) 11/20/2012   Stroke (HCC) 02/14/2020   ischemic/right sided affected   Tobacco abuse    Ulcer of left ankle (HCC)    last 2 months dime size dry dressing changing q day   Past Surgical History:  Past Surgical History:  Procedure Laterality Date   Arm surgery Right 1969   Abscess excision and debridement at elbow   COLONOSCOPY WITH PROPOFOL N/A 09/06/2016   Procedure: COLONOSCOPY WITH PROPOFOL;  Surgeon: Sherrilyn Rist, MD;  Location: [facility];  Service: Gastroenterology;  Laterality: N/A;   I & D EXTREMITY Left 05/29/2014   Procedure: IRRIGATION AND DEBRIDEMENT OF LEFT LEG WOUND AND PLACEMENT OF  INTEGRA  AND  VAC;  Surgeon: Wayland Denis, DO;  Location: [facility];  Service: Plastics;  Laterality: Left;   implantable loop recorder implant  03/06/2020   Medtronic Reveal [place] model LNQ11 (SN  [id number]) implanted by Dr Johney [name] for cryptogenic stroke   INCISION AND DRAINAGE OF WOUND Left 04/23/2014   Procedure: IRRIGATION AND DEBRIDEMENT LOWER LEFT LEG WOUND WITH PLACEMENT OF INTEGRA AND VAC;  Surgeon: Wayland Denis, DO;  Location: [facility];  Service: Government social research officer;  Laterality: Left;   kidney stone removal  2004   MINOR APPLICATION OF WOUND VAC Left 04/23/2014   Procedure: MINOR APPLICATION OF WOUND VAC;  Surgeon: Wayland Denis, DO;  Location: [facility];  Service: Plastics;  Laterality: Left;   SKIN SPLIT GRAFT Left 06/18/2014   Procedure: SKIN GRAFT SPLIT THICKNESS TO LOWER LEFT LEG WOUND WITH PLACEMENT OF VAC;  Surgeon: Wayland Denis, DO;  Location: [facility];  Service: Plastics;  Laterality: Left;   VASECTOMY  1994   Social History:  reports that  he has quit smoking. His smoking use included e-cigarettes. He started smoking about 22 months ago. He has never used smokeless tobacco. He reports that he does not currently use alcohol. He reports that he does not use drugs. Family History:  Family History  Problem Relation Age of Onset   Depression Mother    Anxiety disorder Mother    Stroke Mother        multiple   Leukemia Mother    Pneumonia Mother    Cancer Mother    Diabetes Mother    Diabetes Father    Heart disease Father    Hyperlipidemia Father    Hypertension Father    Allergic rhinitis Neg Hx    Angioedema Neg Hx    Asthma Neg Hx    Eczema Neg Hx    Immunodeficiency Neg Hx    Urticaria Neg Hx      HOME MEDICATIONS: Allergies as of 03/21/2023       Reactions   Cashew Nut Oil Anaphylaxis   Cashews, 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   Pistachio Nut (diagnostic) Hives, Shortness Of Breath, Itching, Swelling, Rash   Latex Dermatitis   Tape Other (See Comments)   Tears and bruises the skin   Betadine [povidone Iodine] Rash, Other (See Comments)   Raised rash 04/23/14   Lidocaine Other (See Comments)   Minimal effect with lidocaine, prefers different anesthetic   Penicillins Rash, Other (See Comments)   Rash all over as a child        Medication List        Accurate as of March 21, 2023  3:14 PM. If you have any questions, ask your nurse or doctor.          acetaminophen 500 MG tablet Commonly known as: TYLENOL Take 1,000 mg by mouth 2 (two) times daily as needed (for pain).   B-D SINGLE USE SWABS REGULAR Pads Use to check blood sugar daily   buPROPion 150 MG 24 hr tablet Commonly known as: WELLBUTRIN XL Take 1 tablet (150 mg total) by mouth daily.   cetirizine 10 MG tablet Commonly known as: ZYRTEC Take 10 mg by mouth daily.   clonazePAM 0.5 MG tablet Commonly known as: KLONOPIN TAKE 1/2 TO 1 TABLET(0.25 TO 0.5 MG) BY MOUTH TWICE DAILY AS NEEDED FOR ANXIETY   diltiazem 30 MG tablet Commonly  known as: CARDIZEM TAKE 1 TABLET BY MOUTH EVERY 4 HOURS AS NEEDED FOR HEART RATE>100.   DULoxetine 60 MG capsule Commonly known as: CYMBALTA Take 1 capsule (60 mg total) by mouth 2 (two) times daily.   EPINEPHrine 0.3 mg/0.3 mL Soaj injection Commonly known as: EPI-PEN Inject 0.3 mg into the muscle as needed for anaphylaxis.   FLUoxetine 40 MG capsule Commonly known as: PROZAC Take 2 capsules (80 mg total) by mouth daily.   hydrochlorothiazide 25 MG tablet Commonly known as: HYDRODIURIL Take 1 tablet (25 mg total) by  mouth daily.   hydrOXYzine 10 MG tablet Commonly known as: ATARAX Take 10 mg by mouth 2 (two) times daily as needed.   LIBREXIA-AF apixaban or placebo 5 mg Caps capsule Take 1 capsule (5 mg total) by mouth 2 (two) times daily. Take at approximately the same time of day with or without food. Please bring bottle back with you to every visit; do not discard bottle. Please contact [facility] for any questions or concerns regarding this medication.   Eliquis 5 MG Tabs tablet Generic drug: apixaban   LIBREXIA-AF [id number] (Milvexian) or placebo 100 mg tablet Take 1 tablet by mouth 2 (two) times daily. Take at approximately the same time of day with or without food. Please bring bottle back with you to every visit; do not discard bottle. Please contact [facility] for any questions or concerns regarding this medication.   lisinopril 10 MG tablet Commonly known as: ZESTRIL Take 1 tablet (10 mg total) by mouth daily.   metFORMIN 500 MG 24 hr tablet Commonly known as: GLUCOPHAGE-XR Take 4 tablets (2,000 mg total) by mouth daily in the afternoon.   multivitamin tablet Take 1 tablet by mouth daily.   pantoprazole 40 MG tablet Commonly known as: PROTONIX TAKE 1 TABLET(40 MG) BY MOUTH DAILY   pentoxifylline 400 MG CR tablet Commonly known as: TRENTAL Take 1 tablet (400 mg total) by mouth 3 (three) times daily with meals.    pregabalin 300 MG capsule Commonly known as: LYRICA TAKE 1 CAPSULE(300 MG) BY MOUTH TWICE DAILY   repaglinide 0.5 MG tablet Commonly known as: PRANDIN Take 1 tablet (0.5 mg total) by mouth 2 (two) times daily before a meal.   rOPINIRole 0.5 MG tablet Commonly known as: REQUIP TAKE 1 TABLET(0.5 MG) BY MOUTH TWICE DAILY   rosuvastatin 20 MG tablet Commonly known as: CRESTOR TAKE 1 TABLET(20 MG) BY MOUTH AT BEDTIME   True Metrix Air Glucose Meter w/Device Kit 1 each by Does not apply route as directed.   True Metrix Blood Glucose Test test strip Generic drug: glucose blood Use to check blood sugar once daily   TRUEplus Lancets 33G Misc Use to check blood sugar 2 times a day         OBJECTIVE:   Vital Signs: BP 120/80 (BP Location: Right Arm, Patient Position: Sitting, Cuff Size: Large)   Pulse 94   Ht 5\' 10"  (1.778 m)   Wt (!) 307 lb (139.3 kg)   SpO2 97%   BMI 44.05 kg/m   Wt Readings from Last 3 Encounters:  03/21/23 (!) 307 lb (139.3 kg)  02/07/23 (!) 301 lb (136.5 kg)  11/30/22 (!) 301 lb (136.5 kg)     Exam: General: Pt appears well and is in NAD  Neck: General: Supple without adenopathy. Thyroid: Thyroid size normal.  No goiter or nodules appreciated.   Lungs: Clear with good BS bilat   Heart: RRR   Extremities: B/L stasis dermatitis   Neuro: MS is good with appropriate affect, pt is alert and Ox3    DM foot exam:08/30/2022 per podiatry      DATA REVIEWED:  Lab Results  Component Value Date   HGBA1C 6.6 (A) 10/28/2022   HGBA1C 6.7 (A) 06/10/2022   HGBA1C 6.0 (A) 03/18/2022    Latest Reference Range & Units 12/09/22 11:42  Sodium 134 - 144 mmol/L 141  Potassium 3.5 - 5.2 mmol/L 4.2  Chloride 96 - 106 mmol/L 104  CO2 20 - 29 mmol/L 23  Glucose 70 - 99 mg/dL 132 (H)  BUN 8 - 27 mg/dL 29 (H)  Creatinine 4.40 - 1.27 mg/dL 1.02  Calcium 8.6 - 72.5 mg/dL 9.9  BUN/Creatinine Ratio 10 - 24  23  eGFR >59 mL/min/1.73 66  Magnesium 1.6 - 2.3  mg/dL 2.0  (H): Data is abnormally high     In office BG 137 mg/dL   ASSESSMENT / PLAN / RECOMMENDATIONS:   1) Type 2 Diabetes Mellitus, Sub-Optimally controlled, With neuropathic and macrovascular complications - Most recent A1c of 7.8 %. Goal A1c < 7.0 %.    -A1c has trended up -He has some nausea with Trulicity, but this is transient and tolerable.  Used to be on Victoza and Ozempic but had to switch due to shortage, but they have noticed better clinical outcome with Trulicity rather than the Victoza and the Ozempic.   -SGLT2 inhibitors and GLP-1 agonist have been cost prohibitive, patient did not qualify for assistance -Will increase repaglinide as below -Patient tends to eat after supper, we discussed low-carb options for snacks  MEDICATIONS: Increase repaglinide 0.5 mg , 2 tablets before lunch and supper Continue Metformin 500 mg XR , 4 tablets daily    EDUCATION / INSTRUCTIONS: BG monitoring instructions: Patient is instructed to check his blood sugars 1 times a day, fasting . Call [facility] if: BG persistently < 70  I reviewed the Rule of 15 for the treatment of hypoglycemia in detail with the patient. Literature supplied.    2) Diabetic complications:  Eye: Does not have known diabetic retinopathy.  Neuro/ Feet: Does  have known diabetic peripheral neuropathy .  Renal: Patient does not have known baseline CKD. He   is  on an ACEI/ARB at present.      F/U 05/08/2023   Signed electronically by: Lyndle Herrlich, MD  [facility]  [facility] [address] [place], [place] [address] Phone: [phone] FAX: [phone]   CC: Hannah Beat, MD [address] [place] [address] Phone: [phone]  Fax: [phone]  Return to Endocrinology clinic as below: Future Appointments  Date Time Provider Department Center  03/23/2023  8:05 AM [facility] DEVICE REMOTES [facility]  [facility]  03/23/2023 11:00 AM Karie Kirks, [facility] None  04/03/2023  3:30 PM Helane Gunther, DPM [facility]  04/06/2023 11:00 AM Jennelle Human, Sheppard Plumber, [facility] None  04/13/2023 11:00 AM Karie Kirks, [facility] None  04/17/2023 10:20 AM Hannah Beat, MD [facility]  04/20/2023 11:00 AM Karie Kirks, [facility] None  05/02/2023 11:00 AM Joan Flores, NP [facility] None  05/04/2023 11:00 AM Karie Kirks, [facility] None  05/08/2023 11:50 AM Avishai Reihl, Konrad Dolores, MD [facility] None  05/11/2023 10:40 AM End, Cristal Deer, MD [facility] None  05/11/2023 11:00 AM Karie Kirks, [facility] None  05/18/2023 11:00 AM Karie Kirks, [facility] None  05/25/2023 11:00 AM Karie Kirks, [facility] None  06/01/2023 11:00 AM Karie Kirks, [facility] None  08/02/2023  1:00 PM Micki Riley, MD [facility] None

## 2023-03-23 ENCOUNTER — Other Ambulatory Visit: Payer: Self-pay | Admitting: Internal Medicine

## 2023-03-23 ENCOUNTER — Ambulatory Visit: Payer: Medicare HMO | Admitting: Psychology

## 2023-03-23 ENCOUNTER — Ambulatory Visit (INDEPENDENT_AMBULATORY_CARE_PROVIDER_SITE_OTHER): Payer: Medicare HMO

## 2023-03-23 DIAGNOSIS — I639 Cerebral infarction, unspecified: Secondary | ICD-10-CM | POA: Diagnosis not present

## 2023-03-23 LAB — CUP PACEART REMOTE DEVICE CHECK
Date Time Interrogation Session: 20241023230137
Implantable Pulse Generator Implant Date: 20211008

## 2023-03-27 ENCOUNTER — Other Ambulatory Visit: Payer: Self-pay

## 2023-03-27 ENCOUNTER — Encounter: Payer: Self-pay | Admitting: Internal Medicine

## 2023-03-27 MED ORDER — REPAGLINIDE 0.5 MG PO TABS
1.0000 mg | ORAL_TABLET | Freq: Two times a day (BID) | ORAL | 3 refills | Status: DC
  Filled 2023-06-12: qty 360, 90d supply, fill #0

## 2023-03-30 ENCOUNTER — Ambulatory Visit: Payer: Medicare HMO | Admitting: Psychology

## 2023-04-03 ENCOUNTER — Encounter: Payer: Self-pay | Admitting: Podiatry

## 2023-04-03 ENCOUNTER — Ambulatory Visit: Payer: Medicare HMO | Admitting: Podiatry

## 2023-04-03 DIAGNOSIS — B351 Tinea unguium: Secondary | ICD-10-CM | POA: Diagnosis not present

## 2023-04-03 DIAGNOSIS — I739 Peripheral vascular disease, unspecified: Secondary | ICD-10-CM | POA: Diagnosis not present

## 2023-04-03 DIAGNOSIS — M2041 Other hammer toe(s) (acquired), right foot: Secondary | ICD-10-CM

## 2023-04-03 DIAGNOSIS — M79676 Pain in unspecified toe(s): Secondary | ICD-10-CM

## 2023-04-03 DIAGNOSIS — M2042 Other hammer toe(s) (acquired), left foot: Secondary | ICD-10-CM

## 2023-04-03 DIAGNOSIS — E1142 Type 2 diabetes mellitus with diabetic polyneuropathy: Secondary | ICD-10-CM | POA: Diagnosis not present

## 2023-04-03 NOTE — Progress Notes (Signed)
This patient returns to my office for at risk foot care.  This patient requires this care by a professional since this patient will be at risk due to having diabetes and vascular disease. This patient is unable to cut nails himself since the patient cannot reach his nails.These nails are painful walking and wearing shoes.    Patient says his toes on his right foot keep curling under his foot when he walks.This patient presents for at risk foot care today.  General Appearance  Alert, conversant and in no acute stress.  Vascular  Dorsalis pedis and posterior tibial  pulses are  weakly palpable  bilaterally.  Capillary return is within normal limits  bilaterally. Temperature is within normal limits  bilaterally.  Neurologic  Senn-Weinstein monofilament wire test within normal limits  bilaterally. Muscle power within normal limits bilaterally.  Nails Thick disfigured discolored nails with subungual debris  from hallux to fifth toes bilaterally. No evidence of bacterial infection or drainage bilaterally.  Orthopedic  No limitations of motion  feet .  No crepitus or effusions noted. HAV 1st MPJ right.  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 greater than left.  Skin  normotropic skin with no porokeratosis noted bilaterally.  No signs of infections or ulcers noted.     Onychomycosis  Pain in right toes  Pain in left toes  Consent was obtained for treatment procedures.   Mechanical debridement of nails 1-5  bilaterally performed with a nail nipper.  Filed with dremel without incident.    Return office visit   2 months                   Told patient to return for periodic foot care and evaluation due to potential at risk complications. Discussed his hammer toe deformity with this patient and dispensed crest pad.   Helane Gunther DPM

## 2023-04-06 ENCOUNTER — Ambulatory Visit: Payer: Medicare HMO | Admitting: Psychology

## 2023-04-11 NOTE — Progress Notes (Unsigned)
Carelink Summary Report / Loop Recorder 

## 2023-04-13 ENCOUNTER — Ambulatory Visit: Payer: Medicare HMO | Admitting: Psychology

## 2023-04-16 NOTE — Progress Notes (Unsigned)
    [name] Dargan T. Kadeen Sroka, MD, CAQ Sports Medicine [facility] [address] [place], [address]  Phone: [phone]  FAX: [phone]  [name] [name] - 62 y.o. male  MRN [id number]  Date of Birth: [birth year]  Date: 04/17/2023  PCP: Hannah Beat, MD  Referral: Hannah Beat, MD  No chief complaint on file.  Subjective:   [name] [name] is a 62 y.o. very pleasant male patient with There is no height or weight on file to calculate BMI. who presents with the following:  [name] [name] is a very nice patient who I have known for many years.  He has multiple ongoing medical problems.  He has recently been having some issues with his hydrochlorothiazide, and he does have severe peripheral neuropathy. He has had multiple strokes  For his diabetes, he is followed by Dr. Lonzo [name].  That has been doing better in recent years.  HTN: Tolerating all medications without side effects Stable and at goal No CP, no sob. No HA.  BP Readings from Last 3 Encounters:  03/21/23 120/80  02/07/23 120/68  11/30/22 94/60    Basic Metabolic Panel:    Component Value Date/Time   NA 141 12/09/2022 1142   K 4.2 12/09/2022 1142   CL 104 12/09/2022 1142   CO2 23 12/09/2022 1142   BUN 29 (H) 12/09/2022 1142   CREATININE 1.24 12/09/2022 1142   GLUCOSE 134 (H) 12/09/2022 1142   GLUCOSE 83 10/15/2020 1100   CALCIUM 9.9 12/09/2022 1142    He also has struggled with severe depression and anxiety in recent years, as well.  I tried multiple different treatments without much significant success, and he is now seeing psychiatry as well as behavioral medicine for counseling.  He is lost well over 100 pounds, and he has done really well with this.  He also has atrial fibrillation now and he is on Eliquis and diltiazem.  Lipids: Doing well, stable. Tolerating meds fine with no SE. Panel reviewed with patient.  Lipids: Lab Results  Component Value Date   CHOL 118  07/19/2022   Lab Results  Component Value Date   HDL 36 (L) 07/19/2022   Lab Results  Component Value Date   LDLCALC 51 07/19/2022   Lab Results  Component Value Date   TRIG 186 (H) 07/19/2022   Lab Results  Component Value Date   CHOLHDL 3.3 07/19/2022    Lab Results  Component Value Date   ALT 35 07/19/2022   AST 20 07/19/2022   ALKPHOS 46 07/19/2022   BILITOT 0.4 07/19/2022      Review of Systems is noted in the HPI, as appropriate  Objective:   There were no vitals taken for this visit.  GEN: No acute distress; alert,appropriate. PULM: Breathing comfortably in no respiratory distress PSYCH: Normally interactive.   Laboratory and Imaging Data:  Assessment and Plan:   ***

## 2023-04-17 ENCOUNTER — Ambulatory Visit (INDEPENDENT_AMBULATORY_CARE_PROVIDER_SITE_OTHER): Payer: Medicare HMO | Admitting: Family Medicine

## 2023-04-17 ENCOUNTER — Encounter: Payer: Self-pay | Admitting: Family Medicine

## 2023-04-17 VITALS — BP 100/70 | HR 68 | Temp 97.3°F | Ht 70.0 in | Wt 308.1 lb

## 2023-04-17 DIAGNOSIS — E1169 Type 2 diabetes mellitus with other specified complication: Secondary | ICD-10-CM

## 2023-04-17 DIAGNOSIS — I639 Cerebral infarction, unspecified: Secondary | ICD-10-CM

## 2023-04-17 DIAGNOSIS — F331 Major depressive disorder, recurrent, moderate: Secondary | ICD-10-CM

## 2023-04-17 DIAGNOSIS — Z23 Encounter for immunization: Secondary | ICD-10-CM | POA: Diagnosis not present

## 2023-04-17 DIAGNOSIS — I10 Essential (primary) hypertension: Secondary | ICD-10-CM | POA: Diagnosis not present

## 2023-04-17 DIAGNOSIS — E785 Hyperlipidemia, unspecified: Secondary | ICD-10-CM

## 2023-04-17 DIAGNOSIS — I480 Paroxysmal atrial fibrillation: Secondary | ICD-10-CM

## 2023-04-17 DIAGNOSIS — R21 Rash and other nonspecific skin eruption: Secondary | ICD-10-CM | POA: Diagnosis not present

## 2023-04-17 DIAGNOSIS — F411 Generalized anxiety disorder: Secondary | ICD-10-CM

## 2023-04-17 DIAGNOSIS — Z8673 Personal history of transient ischemic attack (TIA), and cerebral infarction without residual deficits: Secondary | ICD-10-CM

## 2023-04-17 MED ORDER — TRIAMCINOLONE ACETONIDE 0.1 % EX CREA
1.0000 | TOPICAL_CREAM | Freq: Two times a day (BID) | CUTANEOUS | 1 refills | Status: DC
  Filled 2023-06-12: qty 454, 100d supply, fill #0

## 2023-04-20 ENCOUNTER — Ambulatory Visit: Payer: Medicare HMO | Admitting: Psychology

## 2023-04-24 ENCOUNTER — Other Ambulatory Visit: Payer: Self-pay | Admitting: Behavioral Health

## 2023-04-24 DIAGNOSIS — F331 Major depressive disorder, recurrent, moderate: Secondary | ICD-10-CM

## 2023-04-24 DIAGNOSIS — F411 Generalized anxiety disorder: Secondary | ICD-10-CM

## 2023-04-25 ENCOUNTER — Ambulatory Visit (INDEPENDENT_AMBULATORY_CARE_PROVIDER_SITE_OTHER): Payer: Medicare HMO

## 2023-04-25 DIAGNOSIS — I639 Cerebral infarction, unspecified: Secondary | ICD-10-CM

## 2023-04-25 LAB — CUP PACEART REMOTE DEVICE CHECK
Date Time Interrogation Session: 20241125220112
Implantable Pulse Generator Implant Date: 20211008

## 2023-05-02 ENCOUNTER — Encounter: Payer: Self-pay | Admitting: Behavioral Health

## 2023-05-02 ENCOUNTER — Telehealth: Payer: Medicare HMO | Admitting: Behavioral Health

## 2023-05-02 DIAGNOSIS — F331 Major depressive disorder, recurrent, moderate: Secondary | ICD-10-CM | POA: Diagnosis not present

## 2023-05-02 DIAGNOSIS — F411 Generalized anxiety disorder: Secondary | ICD-10-CM

## 2023-05-02 MED ORDER — BUPROPION HCL ER (XL) 150 MG PO TB24
150.0000 mg | ORAL_TABLET | Freq: Every day | ORAL | 1 refills | Status: DC
  Filled 2023-06-12: qty 90, 90d supply, fill #0

## 2023-05-02 MED ORDER — DULOXETINE HCL 60 MG PO CPEP
60.0000 mg | ORAL_CAPSULE | Freq: Two times a day (BID) | ORAL | 1 refills | Status: DC
  Filled 2023-06-12: qty 180, 90d supply, fill #0

## 2023-05-02 MED ORDER — FLUOXETINE HCL 40 MG PO CAPS
80.0000 mg | ORAL_CAPSULE | Freq: Every day | ORAL | 1 refills | Status: DC
  Filled 2023-06-12 – 2023-06-16 (×3): qty 180, 90d supply, fill #0

## 2023-05-02 NOTE — Progress Notes (Signed)
[name] [name] [id number] [birth year] [name] 62 y.o.  Virtual Visit via Video Note  I connected with pt @ on 05/02/23 at 11:00 AM EST by a video enabled telemedicine application and verified that I am speaking with the correct person using two identifiers.   I discussed the limitations of evaluation and management by telemedicine and the availability of in person appointments. The patient expressed understanding and agreed to proceed.  I discussed the assessment and treatment plan with the patient. The patient was provided an opportunity to ask questions and all were answered. The patient agreed with the plan and demonstrated an understanding of the instructions.   The patient was advised to call back or seek an in-person evaluation if the symptoms worsen or if the condition fails to improve as anticipated.  I provided 30 minutes of non-face-to-face time during this encounter.  The patient was located at home.  The provider was located at [facility].   [name] Flores, NP   Subjective:   Patient ID:  [name] [name] is a 62 y.o. (DOB [name] [name]) male.  Chief Complaint:  Chief Complaint  Patient presents with   Anxiety   Depression   Follow-up   Patient Education   Medication Refill    HPI  "[name] [name]", 62 year old male presents to this office via video visit for  follow up and medication management.  Feeling much better. Spouse believes he is in a good place right now and does not need medication adjustment this visit.   Still struggles with anxiety at times.  Numerically rates his anxiety today at 3/10, and depression at 4/10.  He is sleeping 7 to 8 hours per night.  He denies any history of mania, no psychosis, no auditory or visual hallucinations.  Denies SI or HI.  Has strong family support from his wife and daughters.   Past psychiatric medication trials: Klonopin Prozac Duloxetine   Review of Systems:  Review of Systems  Constitutional: Negative.    Allergic/Immunologic: Negative.   Psychiatric/Behavioral:  Positive for dysphoric mood. The patient is nervous/anxious.     Medications: I have reviewed the patient's current medications.  Current Outpatient Medications  Medication Sig Dispense Refill   acetaminophen (TYLENOL) 500 MG tablet Take 1,000 mg by mouth 2 (two) times daily as needed (for pain).     Alcohol Swabs (B-D SINGLE USE SWABS REGULAR) PADS Use to check blood sugar daily 100 each 3   Blood Glucose Monitoring Suppl (TRUE METRIX AIR GLUCOSE METER) w/Device KIT 1 each by Does not apply route as directed. 1 kit 0   buPROPion (WELLBUTRIN XL) 150 MG 24 hr tablet Take 1 tablet (150 mg total) by mouth daily. 90 tablet 1   cetirizine (ZYRTEC) 10 MG tablet Take 10 mg by mouth daily.     clonazePAM (KLONOPIN) 0.5 MG tablet TAKE 1/2 TO 1 TABLET(0.25 TO 0.5 MG) BY MOUTH TWICE DAILY AS NEEDED FOR ANXIETY 60 tablet 0   diltiazem (CARDIZEM) 30 MG tablet TAKE 1 TABLET BY MOUTH EVERY 4 HOURS AS NEEDED FOR HEART RATE>100. 90 tablet 0   DULoxetine (CYMBALTA) 60 MG capsule Take 1 capsule (60 mg total) by mouth 2 (two) times daily. 180 capsule 1   ELIQUIS 5 MG TABS tablet      EPINEPHrine 0.3 mg/0.3 mL IJ SOAJ injection Inject 0.3 mg into the muscle as needed for anaphylaxis. 2 each 0   FLUoxetine (PROZAC) 40 MG capsule Take 2 capsules (80 mg total) by mouth daily. 180 capsule 1   glucose  blood (TRUE METRIX BLOOD GLUCOSE TEST) test strip Use to check blood sugar once daily 100 each 3   hydrochlorothiazide (HYDRODIURIL) 25 MG tablet Take 1 tablet (25 mg total) by mouth daily. 90 tablet 3   hydrOXYzine (ATARAX) 10 MG tablet Take 10 mg by mouth 2 (two) times daily as needed.     lisinopril (ZESTRIL) 10 MG tablet Take 1 tablet (10 mg total) by mouth daily. 90 tablet 3   metFORMIN (GLUCOPHAGE-XR) 500 MG 24 hr tablet Take 4 tablets (2,000 mg total) by mouth daily in the afternoon. 360 tablet 3   Multiple Vitamin (MULTIVITAMIN) tablet Take 1 tablet by  mouth daily.     pantoprazole (PROTONIX) 40 MG tablet TAKE 1 TABLET(40 MG) BY MOUTH DAILY 90 tablet 3   pentoxifylline (TRENTAL) 400 MG CR tablet Take 1 tablet (400 mg total) by mouth 3 (three) times daily with meals. 270 tablet 3   pregabalin (LYRICA) 300 MG capsule TAKE 1 CAPSULE(300 MG) BY MOUTH TWICE DAILY 180 capsule 1   repaglinide (PRANDIN) 0.5 MG tablet Take 2 tablets (1 mg total) by mouth 2 (two) times daily before a meal. 360 tablet 3   rOPINIRole (REQUIP) 0.5 MG tablet TAKE 1 TABLET(0.5 MG) BY MOUTH TWICE DAILY 180 tablet 3   rosuvastatin (CRESTOR) 20 MG tablet TAKE 1 TABLET(20 MG) BY MOUTH AT BEDTIME 90 tablet 3   STUDY - LIBREXIA-AF - apixaban 5 mg or placebo capsule (PI-Sethi) Take 1 capsule (5 mg total) by mouth 2 (two) times daily. Take at approximately the same time of day with or without food. Please bring bottle back with you to every visit; do not discard bottle. Please contact [facility] for any questions or concerns regarding this medication. 280 capsule 0   STUDY - LIBREXIA-AF - [id number] (Milvexian) 100 mg or placebo tablet (PI-Sethi) Take 1 tablet by mouth 2 (two) times daily. Take at approximately the same time of day with or without food. Please bring bottle back with you to every visit; do not discard bottle. Please contact [facility] for any questions or concerns regarding this medication. 280 tablet 0   triamcinolone cream (KENALOG) 0.1 % Apply 1 Application topically 2 (two) times daily. 454 g 1   TRUEplus Lancets 33G MISC Use to check blood sugar 2 times a day 200 each 3   No current facility-administered medications for this visit.    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   Pistachio Nut (Diagnostic) Hives, Shortness Of Breath, Itching, Swelling and Rash   Latex Dermatitis   Tape Other (See Comments)    Tears and bruises the  skin   Betadine [Povidone Iodine] Rash and Other (See Comments)    Raised rash 04/23/14   Lidocaine Other (See Comments)    Minimal effect with lidocaine, prefers different anesthetic   Penicillins Rash and Other (See Comments)    Rash all over as a child    Past Medical History:  Diagnosis Date   Angio-edema    Anxiety    Depression    Diabetes mellitus type II    type 2   GERD (gastroesophageal reflux disease)    Headache    migraine   History of kidney stones    HLD (hyperlipidemia)    HTN (hypertension)    Morbid obesity (HCC)    Paroxysmal atrial fibrillation (HCC)    Restless legs syndrome (RLS)  11/20/2012   Stroke (HCC) 02/14/2020   ischemic/right sided affected   Tobacco abuse    Ulcer of left ankle (HCC)    last 2 months dime size dry dressing changing q day    Family History  Problem Relation Age of Onset   Depression Mother    Anxiety disorder Mother    Stroke Mother        multiple   Leukemia Mother    Pneumonia Mother    Cancer Mother    Diabetes Mother    Diabetes Father    Heart disease Father    Hyperlipidemia Father    Hypertension Father    Allergic rhinitis Neg Hx    Angioedema Neg Hx    Asthma Neg Hx    Eczema Neg Hx    Immunodeficiency Neg Hx    Urticaria Neg Hx     Social History   Socioeconomic History   Marital status: Married    Spouse name: jaspal mullan   Number of children: 3   Years of education: Not on file   Highest education level: 12th grade  Occupational History   Occupation: Therapist, nutritional and receivables    Comment: no longer working 07/07/20  Tobacco Use   Smoking status: Former    Types: E-cigarettes    Start date: 05/05/2021   Smokeless tobacco: Never   Tobacco comments:    Not smoking currently  Vaping Use   Vaping status: Every Day   Substances: Flavoring  Substance and Sexual Activity   Alcohol use: Not Currently    Alcohol/week: 0.0 standard drinks of alcohol    Comment: rarely   Drug use: No    Sexual activity: Yes    Partners: Female  Other Topics Concern   Not on file  Social History Narrative   Lives with wife   Right Handed   Drink 6-7 cups caffeine daily   Social Determinants of Health   Financial Resource Strain: High Risk (04/14/2023)   Overall Financial Resource Strain (CARDIA)    Difficulty of Paying Living Expenses: Very hard  Food Insecurity: Food Insecurity Present (04/14/2023)   Hunger Vital Sign    Worried About Running Out of Food in the Last Year: Often true    Ran Out of Food in the Last Year: Often true  Transportation Needs: Unmet Transportation Needs (04/14/2023)   PRAPARE - Administrator, Civil Service (Medical): Yes    Lack of Transportation (Non-Medical): Yes  Physical Activity: Inactive (04/14/2023)   Exercise Vital Sign    Days of Exercise per Week: 0 days    Minutes of Exercise per Session: 30 min  Stress: Stress Concern Present (04/14/2023)   [employer] of Occupational Health - Occupational Stress Questionnaire    Feeling of Stress : Very much  Social Connections: Moderately Integrated (04/14/2023)   Social Connection and Isolation Panel [NHANES]    Frequency of Communication with Friends and Family: Three times a week    Frequency of Social Gatherings with Friends and Family: Once a week    Attends Religious Services: 1 to 4 times per year    Active Member of [employer] or Organizations: No    Attends Engineer, structural: Not on file    Marital Status: Married  Catering manager Violence: Not on file    Past Medical History, Surgical history, Social history, and Family history were reviewed and updated as appropriate.   Please see review of systems for further details on the patient's  review from today.   Objective:   Physical Exam:  There were no vitals taken for this visit.  Physical Exam Constitutional:      General: He is not in acute distress.    Appearance: Normal appearance.  Neurological:      Mental Status: He is alert and oriented to person, place, and time.     Gait: Gait normal.  Psychiatric:        Attention and Perception: Attention and perception normal. He does not perceive auditory or visual hallucinations.        Mood and Affect: Mood and affect normal. Mood is not anxious or depressed. Affect is not labile.        Speech: Speech normal.        Behavior: Behavior normal. Behavior is cooperative.        Thought Content: Thought content normal.        Cognition and Memory: Cognition and memory normal.        Judgment: Judgment normal.     Lab Review:     Component Value Date/Time   NA 141 12/09/2022 1142   K 4.2 12/09/2022 1142   CL 104 12/09/2022 1142   CO2 23 12/09/2022 1142   GLUCOSE 134 (H) 12/09/2022 1142   GLUCOSE 83 10/15/2020 1100   BUN 29 (H) 12/09/2022 1142   CREATININE 1.24 12/09/2022 1142   CALCIUM 9.9 12/09/2022 1142   PROT 5.9 (L) 07/19/2022 1006   ALBUMIN 4.2 07/19/2022 1006   AST 20 07/19/2022 1006   ALT 35 07/19/2022 1006   ALKPHOS 46 07/19/2022 1006   BILITOT 0.4 07/19/2022 1006   GFRNONAA >60 10/15/2020 1100   GFRAA 91 06/17/2020 1030       Component Value Date/Time   WBC 7.2 07/19/2022 1006   WBC 10.1 10/15/2020 1100   RBC 5.41 07/19/2022 1006   RBC 5.98 (H) 10/15/2020 1100   HGB 16.1 07/19/2022 1006   HCT 46.1 07/19/2022 1006   PLT 182 07/19/2022 1006   MCV 85 07/19/2022 1006   MCH 29.8 07/19/2022 1006   MCH 28.9 10/15/2020 1100   MCHC 34.9 07/19/2022 1006   MCHC 34.2 10/15/2020 1100   RDW 14.1 07/19/2022 1006   LYMPHSABS 1.5 07/19/2022 1006   MONOABS 1.1 (H) 02/15/2020 0142   EOSABS 0.2 07/19/2022 1006   BASOSABS 0.0 07/19/2022 1006    No results found for: "POCLITH", "LITHIUM"   No results found for: "PHENYTOIN", "PHENOBARB", "VALPROATE", "CBMZ"   .res Assessment: Plan:    Greater than 50% of 30  min video visit with patient was spent on counseling and coordination of care. Discussed his decline with anxiety and  depression. Talked about his relationship problems with his daughter being the trigger.  He is requesting medication change or adjustment. He is feeling well enough to request no changes to his medication regimen. He will continue to see Maggie Font  for therapy.    Pt will continue:   To continue Wellbutrin 150 mg XL daily.  Cymbalta 60 twice daily mg daily To continue Prozac 40 mg twice daily  Continue Klonopin take 1/2 to 1 tablet twice daily for severe anxiety.  Will report worsening symptoms or side effects promptly Will follow-up in 6 weeks to reassess Provided emergency contact information Discussed potential benefits, risk, and side effects of benzodiazepines to include potential risk of tolerance and dependence, as well as possible drowsiness.  Advised patient not to drive if experiencing drowsiness and to take lowest possible effective  dose to minimize risk of dependence and tolerance.  Reviewed PDMP           [name] "[name] [name]" was seen today for anxiety, depression, follow-up, patient education and medication refill.  Diagnoses and all orders for this visit:  Moderate episode of recurrent major depressive disorder (HCC) -     DULoxetine (CYMBALTA) 60 MG capsule; Take 1 capsule (60 mg total) by mouth 2 (two) times daily. -     FLUoxetine (PROZAC) 40 MG capsule; Take 2 capsules (80 mg total) by mouth daily.  Generalized anxiety disorder -     DULoxetine (CYMBALTA) 60 MG capsule; Take 1 capsule (60 mg total) by mouth 2 (two) times daily. -     FLUoxetine (PROZAC) 40 MG capsule; Take 2 capsules (80 mg total) by mouth daily.  Other orders -     buPROPion (WELLBUTRIN XL) 150 MG 24 hr tablet; Take 1 tablet (150 mg total) by mouth daily.     Please see After Visit Summary for patient specific instructions.  Future Appointments  Date Time Provider Department Center  05/08/2023 11:50 AM Shamleffer, Konrad Dolores, MD [facility] None  05/11/2023 10:40 AM End, Cristal Deer, MD  [facility] None  05/22/2023 12:45 PM Helane Gunther, DPM [facility]  05/29/2023  8:25 AM [facility] DEVICE REMOTES [facility]  07/03/2023  8:25 AM [facility] DEVICE REMOTES [facility]  07/19/2023  8:30 AM [facility]  07/26/2023  9:40 AM Copland, Karleen Hampshire, MD [facility]  08/02/2023  1:00 PM Micki Riley, MD [facility] None  08/07/2023  8:25 AM [facility] DEVICE REMOTES [facility]  09/11/2023  8:25 AM [facility] DEVICE REMOTES [facility]  10/16/2023  8:25 AM [facility] DEVICE REMOTES [facility]  11/20/2023  8:25 AM [facility] DEVICE REMOTES [facility]  12/25/2023  8:25 AM [facility] DEVICE REMOTES [facility]  01/30/2024  7:10 AM [facility] DEVICE REMOTES [facility]  03/04/2024  8:25 AM [facility] DEVICE REMOTES [facility]  04/08/2024  8:25 AM [facility] DEVICE REMOTES [facility]  05/13/2024  8:25 AM [facility] DEVICE REMOTES [facility]  06/17/2024  8:25 AM [facility] DEVICE REMOTES [facility]    No orders of the defined types were placed in this encounter.     -------------------------------

## 2023-05-04 ENCOUNTER — Ambulatory Visit: Payer: Medicare HMO | Admitting: Psychology

## 2023-05-04 ENCOUNTER — Other Ambulatory Visit (HOSPITAL_COMMUNITY): Payer: Self-pay | Admitting: Neurology

## 2023-05-04 MED ORDER — STUDY - LIBREXIA-AF - APIXABAN 5 MG OR PLACEBO CAPSULE (PI-SETHI): 5.0000 mg | ORAL_CAPSULE | Freq: Two times a day (BID) | ORAL | 0 refills | Status: DC

## 2023-05-04 MED ORDER — STUDY - LIBREXIA-AF - JNJ-70033093 (MILVEXIAN) 100 MG OR PLACEBO (PI-SETHI): 1.0000 | ORAL_TABLET | Freq: Two times a day (BID) | ORAL | 0 refills | Status: DC

## 2023-05-08 ENCOUNTER — Ambulatory Visit: Payer: Medicare HMO | Admitting: Internal Medicine

## 2023-05-08 ENCOUNTER — Other Ambulatory Visit: Payer: Self-pay | Admitting: Family Medicine

## 2023-05-08 NOTE — Telephone Encounter (Signed)
Last office visit 04/17/2023 for Medical Management of Chronic Issues.  Last refilled 02/23/2023 for #180 with 1 refill.  Next Appt: CPE 07/26/2023.

## 2023-05-10 ENCOUNTER — Other Ambulatory Visit: Payer: Self-pay | Admitting: Behavioral Health

## 2023-05-11 ENCOUNTER — Ambulatory Visit: Payer: Medicare HMO | Admitting: Internal Medicine

## 2023-05-11 ENCOUNTER — Ambulatory Visit: Payer: Medicare HMO | Admitting: Psychology

## 2023-05-18 ENCOUNTER — Ambulatory Visit: Payer: Medicare HMO | Admitting: Psychology

## 2023-05-22 ENCOUNTER — Encounter: Payer: Self-pay | Admitting: Podiatry

## 2023-05-22 ENCOUNTER — Ambulatory Visit: Payer: Medicare HMO | Admitting: Podiatry

## 2023-05-22 VITALS — Ht 70.0 in | Wt 308.1 lb

## 2023-05-22 DIAGNOSIS — L089 Local infection of the skin and subcutaneous tissue, unspecified: Secondary | ICD-10-CM | POA: Insufficient documentation

## 2023-05-22 DIAGNOSIS — E1159 Type 2 diabetes mellitus with other circulatory complications: Secondary | ICD-10-CM | POA: Diagnosis not present

## 2023-05-22 DIAGNOSIS — M2042 Other hammer toe(s) (acquired), left foot: Secondary | ICD-10-CM | POA: Diagnosis not present

## 2023-05-22 DIAGNOSIS — M2041 Other hammer toe(s) (acquired), right foot: Secondary | ICD-10-CM

## 2023-05-22 MED ORDER — DOXYCYCLINE HYCLATE 100 MG PO TABS: 100.0000 mg | ORAL_TABLET | Freq: Two times a day (BID) | ORAL | 0 refills | Status: DC

## 2023-05-22 NOTE — Progress Notes (Unsigned)
This patient presents to the office with chief complaint of painful tip of third toe right foot.  Patient has been diagnosed with hammer toes which have improved using crest pads.  His third toe has a pinhole sized lesion third toe right foot.  He says it gets red and swollen and becomes painful.  His wife who is a nurse has been treating his toe.  He presents to the office for evaluation and treatment.  She says he is not a candidate for surgery per his medical doctor.  General Appearance  Alert, conversant and in no acute stress.  Vascular  Dorsalis pedis and posterior tibial  pulses are palpable  bilaterally.  Capillary return is within normal limits  bilaterally. Temperature is within normal limits  bilaterally.  Neurologic  Senn-Weinstein monofilament wire test within normal limits  bilaterally. Muscle power within normal limits bilaterally.  Nails Thick disfigured discolored nails with subungual debris  from hallux to fifth toes bilaterally. No evidence of bacterial infection or drainage bilaterally.  Orthopedic  No limitations of motion  feet .  No crepitus or effusions noted.  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   Skin  normotropic skin with no porokeratosis noted bilaterally.  No signs of infections or ulcers noted.   Thick callused tissue on distal aspect third toe right foot.  The is a pin sized opening noted.  No drainage noted.   Infected ulcer secondary to hammer toe third right foot.    ROV.  Neosporin applied with DSD.  Prescribe doxycycline for 10 days.  Recommend toe cap and spenco insoles.  RTC prn.   Helane Gunther DPM

## 2023-05-25 ENCOUNTER — Ambulatory Visit: Payer: Medicare HMO | Admitting: Psychology

## 2023-05-25 ENCOUNTER — Encounter: Payer: Self-pay | Admitting: Podiatry

## 2023-05-27 ENCOUNTER — Other Ambulatory Visit: Payer: Self-pay | Admitting: Family Medicine

## 2023-05-29 ENCOUNTER — Ambulatory Visit (INDEPENDENT_AMBULATORY_CARE_PROVIDER_SITE_OTHER): Payer: Medicare HMO

## 2023-05-29 ENCOUNTER — Encounter: Payer: Self-pay | Admitting: Family Medicine

## 2023-05-29 DIAGNOSIS — I639 Cerebral infarction, unspecified: Secondary | ICD-10-CM

## 2023-05-29 NOTE — Progress Notes (Signed)
[facility] Summary Report / Loop Recorder 

## 2023-05-30 LAB — CUP PACEART REMOTE DEVICE CHECK
Date Time Interrogation Session: 20241230220228
Implantable Pulse Generator Implant Date: 20211008

## 2023-06-01 ENCOUNTER — Ambulatory Visit: Payer: Medicare HMO | Admitting: Psychology

## 2023-06-05 ENCOUNTER — Other Ambulatory Visit (HOSPITAL_COMMUNITY): Payer: Self-pay

## 2023-06-06 ENCOUNTER — Other Ambulatory Visit (HOSPITAL_COMMUNITY): Payer: Self-pay

## 2023-06-06 MED ORDER — TRULICITY 4.5 MG/0.5ML ~~LOC~~ SOAJ
4.5000 mg | SUBCUTANEOUS | 3 refills | Status: DC
  Filled 2023-06-07: qty 6, 84d supply, fill #0

## 2023-06-06 MED ORDER — VARENICLINE TARTRATE 1 MG PO TABS: 1.0000 mg | ORAL_TABLET | Freq: Two times a day (BID) | ORAL | 3 refills | Status: DC

## 2023-06-06 MED ORDER — REPAGLINIDE 0.5 MG PO TABS: 0.5000 mg | ORAL_TABLET | Freq: Every day | ORAL | 1 refills | Status: DC

## 2023-06-06 MED ORDER — METFORMIN HCL ER 500 MG PO TB24
2000.0000 mg | ORAL_TABLET | Freq: Every day | ORAL | 3 refills | Status: DC
  Filled 2023-06-12: qty 360, 90d supply, fill #0

## 2023-06-06 MED ORDER — DAPAGLIFLOZIN PROPANEDIOL 10 MG PO TABS
10.0000 mg | ORAL_TABLET | Freq: Every day | ORAL | 3 refills | Status: DC
  Filled 2023-06-07: qty 90, 90d supply, fill #0

## 2023-06-06 MED ORDER — HYDROCHLOROTHIAZIDE 12.5 MG PO TABS
12.5000 mg | ORAL_TABLET | Freq: Every day | ORAL | 1 refills | Status: DC
  Filled 2023-06-12: qty 90, 90d supply, fill #0

## 2023-06-06 MED ORDER — REPAGLINIDE 0.5 MG PO TABS
0.5000 mg | ORAL_TABLET | Freq: Two times a day (BID) | ORAL | 3 refills | Status: DC
  Filled 2023-06-12: qty 180, 90d supply, fill #0

## 2023-06-06 MED ORDER — HYDROCHLOROTHIAZIDE 25 MG PO TABS
25.0000 mg | ORAL_TABLET | Freq: Every day | ORAL | 3 refills | Status: DC
  Filled 2023-06-12: qty 90, 90d supply, fill #0

## 2023-06-06 MED ORDER — PANTOPRAZOLE SODIUM 40 MG PO TBEC
40.0000 mg | DELAYED_RELEASE_TABLET | Freq: Every day | ORAL | 3 refills | Status: DC
  Filled 2023-06-12: qty 90, 90d supply, fill #0

## 2023-06-06 MED ORDER — FLUOXETINE HCL 40 MG PO CAPS
80.0000 mg | ORAL_CAPSULE | Freq: Every day | ORAL | 0 refills | Status: DC
  Filled 2023-06-12: qty 180, 90d supply, fill #0

## 2023-06-06 MED ORDER — TRULICITY 4.5 MG/0.5ML ~~LOC~~ SOAJ
4.5000 mg | SUBCUTANEOUS | 3 refills | Status: DC
  Filled 2023-06-12: qty 2, 28d supply, fill #0
  Filled 2023-06-21 – 2023-07-03 (×2): qty 2, 28d supply, fill #1

## 2023-06-06 MED ORDER — EMPAGLIFLOZIN 25 MG PO TABS
25.0000 mg | ORAL_TABLET | Freq: Every day | ORAL | 3 refills | Status: DC
  Filled 2023-06-12 – 2023-06-16 (×3): qty 90, 90d supply, fill #0

## 2023-06-06 MED ORDER — DULOXETINE HCL 30 MG PO CPEP
ORAL_CAPSULE | ORAL | 0 refills | Status: AC
  Filled 2023-06-12: qty 90, 30d supply, fill #0

## 2023-06-06 MED ORDER — LISINOPRIL 10 MG PO TABS
10.0000 mg | ORAL_TABLET | Freq: Every day | ORAL | 3 refills | Status: DC
  Filled 2023-06-12: qty 90, 90d supply, fill #0

## 2023-06-06 MED ORDER — ROPINIROLE HCL 0.5 MG PO TABS
0.5000 mg | ORAL_TABLET | Freq: Two times a day (BID) | ORAL | 3 refills | Status: DC
  Filled 2023-06-12: qty 180, 90d supply, fill #0

## 2023-06-06 MED ORDER — LISINOPRIL 10 MG PO TABS
10.0000 mg | ORAL_TABLET | Freq: Every day | ORAL | 3 refills | Status: DC
  Filled 2023-06-12: qty 90, 90d supply, fill #0
  Filled 2023-09-07: qty 90, 90d supply, fill #1

## 2023-06-07 ENCOUNTER — Other Ambulatory Visit (HOSPITAL_COMMUNITY): Payer: Self-pay

## 2023-06-08 ENCOUNTER — Other Ambulatory Visit: Payer: Self-pay

## 2023-06-08 ENCOUNTER — Other Ambulatory Visit (HOSPITAL_COMMUNITY): Payer: Self-pay

## 2023-06-09 ENCOUNTER — Ambulatory Visit: Payer: HMO | Admitting: Cardiology

## 2023-06-12 ENCOUNTER — Other Ambulatory Visit: Payer: Self-pay

## 2023-06-12 ENCOUNTER — Other Ambulatory Visit: Payer: Self-pay | Admitting: Family Medicine

## 2023-06-12 ENCOUNTER — Other Ambulatory Visit (HOSPITAL_COMMUNITY): Payer: Self-pay

## 2023-06-12 ENCOUNTER — Other Ambulatory Visit: Payer: Self-pay | Admitting: Internal Medicine

## 2023-06-12 MED ORDER — ROSUVASTATIN CALCIUM 20 MG PO TABS
20.0000 mg | ORAL_TABLET | Freq: Every day | ORAL | 0 refills | Status: DC
  Filled 2023-06-12: qty 90, 90d supply, fill #0

## 2023-06-12 MED ORDER — PENTOXIFYLLINE ER 400 MG PO TBCR
400.0000 mg | EXTENDED_RELEASE_TABLET | Freq: Three times a day (TID) | ORAL | 0 refills | Status: DC
  Filled 2023-06-12: qty 270, 90d supply, fill #0

## 2023-06-12 MED ORDER — PANTOPRAZOLE SODIUM 40 MG PO TBEC
40.0000 mg | DELAYED_RELEASE_TABLET | Freq: Every day | ORAL | 0 refills | Status: DC
  Filled 2023-06-12: qty 90, 90d supply, fill #0

## 2023-06-13 ENCOUNTER — Other Ambulatory Visit (HOSPITAL_COMMUNITY): Payer: Self-pay

## 2023-06-13 ENCOUNTER — Other Ambulatory Visit: Payer: Self-pay

## 2023-06-15 ENCOUNTER — Other Ambulatory Visit (HOSPITAL_COMMUNITY): Payer: Self-pay

## 2023-06-16 ENCOUNTER — Other Ambulatory Visit (HOSPITAL_COMMUNITY): Payer: Self-pay

## 2023-06-16 ENCOUNTER — Other Ambulatory Visit: Payer: Self-pay

## 2023-06-16 ENCOUNTER — Encounter: Payer: Self-pay | Admitting: Family Medicine

## 2023-06-16 ENCOUNTER — Other Ambulatory Visit: Payer: Self-pay | Admitting: Internal Medicine

## 2023-06-16 MED ORDER — REPAGLINIDE 1 MG PO TABS
1.0000 mg | ORAL_TABLET | Freq: Two times a day (BID) | ORAL | 3 refills | Status: DC
  Filled 2023-06-16: qty 180, 90d supply, fill #0

## 2023-06-16 MED ORDER — LANCETS MISC
1.0000 | Freq: Three times a day (TID) | 0 refills | Status: DC
  Filled 2023-06-16: qty 100, 30d supply, fill #0

## 2023-06-16 MED ORDER — BLOOD GLUCOSE TEST VI STRP
1.0000 | ORAL_STRIP | Freq: Every day | 0 refills | Status: DC
  Filled 2023-06-16: qty 50, 50d supply, fill #0
  Filled 2023-08-06: qty 25, 25d supply, fill #1

## 2023-06-16 MED ORDER — BLOOD GLUCOSE MONITOR SYSTEM W/DEVICE KIT
1.0000 | PACK | Freq: Every day | 0 refills | Status: DC
  Filled 2023-06-16: qty 1, 30d supply, fill #0

## 2023-06-16 MED ORDER — LANCET DEVICE MISC
1.0000 | Freq: Three times a day (TID) | 0 refills | Status: DC
  Filled 2023-06-16: qty 1, 30d supply, fill #0

## 2023-06-20 ENCOUNTER — Encounter: Payer: Self-pay | Admitting: Internal Medicine

## 2023-06-21 ENCOUNTER — Other Ambulatory Visit (HOSPITAL_COMMUNITY): Payer: Self-pay

## 2023-06-22 ENCOUNTER — Telehealth: Payer: Self-pay

## 2023-06-22 ENCOUNTER — Other Ambulatory Visit (HOSPITAL_COMMUNITY): Payer: Self-pay

## 2023-06-22 NOTE — Telephone Encounter (Signed)
Trulicity needs PA

## 2023-06-22 NOTE — Telephone Encounter (Signed)
Pharmacy Patient Advocate Encounter   Received notification from Pt Calls Messages that prior authorization for Trulicity is required/requested.   Insurance verification completed.   The patient is insured through [facility] ADVANTAGE/RX ADVANCE .   Per test claim: PA required; PA submitted to above mentioned insurance via CoverMyMeds Key/confirmation #/EOC [id number] Status is pending   PA has been APPROVED through 06/21/2024 PA Case ID #: [id number]

## 2023-06-22 NOTE — Telephone Encounter (Signed)
Pharmacy Patient Advocate Encounter   Received notification from Pt Calls Messages that prior authorization for [name] is required/requested.   Insurance verification completed.   The patient is insured through [facility] ADVANTAGE/RX ADVANCE .   Per test claim: PA required; PA submitted to above mentioned insurance via CoverMyMeds Key/confirmation #/EOC [facility] Status is pending

## 2023-06-23 NOTE — Telephone Encounter (Signed)
Pharmacy Patient Advocate Encounter  Received notification from [facility] ADVANTAGE/RX ADVANCE that Prior Authorization for [name] has been APPROVED through 05/29/2024   PA #/Case ID/Reference #: [id number]

## 2023-06-27 ENCOUNTER — Other Ambulatory Visit: Payer: Self-pay | Admitting: Family Medicine

## 2023-06-27 ENCOUNTER — Other Ambulatory Visit: Payer: Self-pay

## 2023-06-27 ENCOUNTER — Other Ambulatory Visit (HOSPITAL_COMMUNITY): Payer: Self-pay

## 2023-06-27 MED ORDER — ONE-DAILY MULTI VITAMINS PO TABS
1.0000 | ORAL_TABLET | Freq: Every day | ORAL | 3 refills | Status: AC
  Filled 2023-06-27: qty 100, 100d supply, fill #0
  Filled 2023-09-29: qty 100, 100d supply, fill #1
  Filled 2023-11-25 – 2024-01-08 (×2): qty 100, 100d supply, fill #2
  Filled 2024-02-19 – 2024-04-16 (×3): qty 100, 100d supply, fill #3

## 2023-06-27 MED ORDER — ACETAMINOPHEN 500 MG PO TABS
1000.0000 mg | ORAL_TABLET | Freq: Two times a day (BID) | ORAL | 3 refills | Status: DC | PRN
  Filled 2023-06-27: qty 120, 30d supply, fill #0
  Filled 2023-09-18: qty 120, 30d supply, fill #1
  Filled 2023-11-01 (×2): qty 120, 30d supply, fill #2
  Filled 2023-11-25: qty 120, 30d supply, fill #3

## 2023-06-29 ENCOUNTER — Ambulatory Visit: Payer: HMO | Admitting: Cardiology

## 2023-07-03 ENCOUNTER — Ambulatory Visit (INDEPENDENT_AMBULATORY_CARE_PROVIDER_SITE_OTHER): Payer: Medicare HMO

## 2023-07-03 ENCOUNTER — Telehealth: Payer: Self-pay | Admitting: *Deleted

## 2023-07-03 DIAGNOSIS — E785 Hyperlipidemia, unspecified: Secondary | ICD-10-CM

## 2023-07-03 DIAGNOSIS — E559 Vitamin D deficiency, unspecified: Secondary | ICD-10-CM

## 2023-07-03 DIAGNOSIS — I480 Paroxysmal atrial fibrillation: Secondary | ICD-10-CM

## 2023-07-03 DIAGNOSIS — Z125 Encounter for screening for malignant neoplasm of prostate: Secondary | ICD-10-CM

## 2023-07-03 DIAGNOSIS — E1159 Type 2 diabetes mellitus with other circulatory complications: Secondary | ICD-10-CM

## 2023-07-03 DIAGNOSIS — I639 Cerebral infarction, unspecified: Secondary | ICD-10-CM

## 2023-07-03 DIAGNOSIS — E538 Deficiency of other specified B group vitamins: Secondary | ICD-10-CM

## 2023-07-03 DIAGNOSIS — Z79899 Other long term (current) drug therapy: Secondary | ICD-10-CM

## 2023-07-03 NOTE — Telephone Encounter (Signed)
-----   Message from Alvina Chou sent at 07/03/2023 11:06 AM EST ----- Regarding: lab orders for Wed, 2.19.25 Patient is scheduled for CPX labs, please order future labs, Thanks , Camelia Eng

## 2023-07-05 ENCOUNTER — Encounter: Payer: Self-pay | Admitting: Podiatry

## 2023-07-05 ENCOUNTER — Ambulatory Visit (INDEPENDENT_AMBULATORY_CARE_PROVIDER_SITE_OTHER): Payer: HMO | Admitting: Podiatry

## 2023-07-05 DIAGNOSIS — L97509 Non-pressure chronic ulcer of other part of unspecified foot with unspecified severity: Secondary | ICD-10-CM | POA: Insufficient documentation

## 2023-07-05 DIAGNOSIS — M2041 Other hammer toe(s) (acquired), right foot: Secondary | ICD-10-CM

## 2023-07-05 DIAGNOSIS — E1159 Type 2 diabetes mellitus with other circulatory complications: Secondary | ICD-10-CM

## 2023-07-05 DIAGNOSIS — M2042 Other hammer toe(s) (acquired), left foot: Secondary | ICD-10-CM

## 2023-07-05 DIAGNOSIS — B351 Tinea unguium: Secondary | ICD-10-CM

## 2023-07-05 DIAGNOSIS — M79676 Pain in unspecified toe(s): Secondary | ICD-10-CM

## 2023-07-05 DIAGNOSIS — L97511 Non-pressure chronic ulcer of other part of right foot limited to breakdown of skin: Secondary | ICD-10-CM

## 2023-07-05 NOTE — Progress Notes (Signed)
 This patient returns to my office for at risk foot care.  This patient requires this care by a professional since this patient will be at risk due to having diabetes and vascular disease. This patient is unable to cut nails himself since the patient cannot reach his nails.These nails are painful walking and wearing shoes.    Patient says his toes on his right foot keep curling under his foot when he walks. He has developed a skin ulcer on the tip of his third toe right foot. He has been given a crest pad, toe cap to treat the open wound.  Problem persists.This patient presents for at risk foot care today.  General Appearance  Alert, conversant and in no acute stress.  Vascular  Dorsalis pedis and posterior tibial  pulses are  weakly palpable  bilaterally.  Capillary return is within normal limits  bilaterally. Temperature is within normal limits  bilaterally.  Neurologic  Senn-Weinstein monofilament wire test within normal limits  bilaterally. Muscle power within normal limits bilaterally.  Nails Thick disfigured discolored nails with subungual debris  from hallux to fifth toes bilaterally. No evidence of bacterial infection or drainage bilaterally.  Orthopedic  No limitations of motion  feet .  No crepitus or effusions noted. HAV 1st MPJ right.  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 greater than left.  Skin  normotropic skin with no porokeratosis noted bilaterally.  No signs of infections or ulcers noted.  Open wound third toe right foot.  No drainage or infection.   Onychomycosis  Pain in right toes  Pain in left toes Skin ulcer 3rd toe right foot.  Consent was obtained for treatment procedures.   Mechanical debridement of nails 1-5  bilaterally performed with a nail nipper.  Filed with dremel without incident.    Return office visit   2 months                   Told patient to return for periodic foot care and evaluation due to potential at risk complications. Discussed his hammer toe deformity with  this patient  and recommended he see Dr.  Tobie for tenotomy.   Cordella Bold DPM

## 2023-07-06 NOTE — Progress Notes (Signed)
 Name: [name] [name].  Age/ Sex: 63 y.o., male   MRN/ DOB: [id number], 1960-09-13     PCP: Watt Mirza, MD   Reason for Endocrinology Evaluation: Type 2 Diabetes Mellitus  Initial Endocrine Consultative Visit: 03/01/2019    PATIENT IDENTIFIER: [name] [name]. is a 63 y.o. male with a past medical history of HTN, T2DM,Hx CVA. The patient has followed with Endocrinology clinic since 03/01/2019 for consultative assistance with management of his diabetes.  DIABETIC HISTORY:  [name] [name] was diagnosed with DM 2010, patient intolerant to bromocriptine . His hemoglobin A1c has ranged from 5.9% in 2022, peaking at 10.7% in 2020.   The patient has been followed up with Dr. Kassie between 2020 until April 2023  Stopped Farxiga  07/2022 due to cost issues.  Patient did not qualify for patient assistance  Off Trulicity  and SGLT2 inhibitors by 10/2022 but restarted 05/2023  SUBJECTIVE:   During the last visit (10/27/2021): A1c 6.6%   Today (07/07/2023): [name] [name] is here for follow-up on diabetes management. He is accompanied by his spouse today. He checks his blood sugars 1 times daily. The patient has not had hypoglycemic episodes since the last clinic visit.  No glucose meter today    Patient follows with behavioral health for depression Had a follow-up with cardiology for paroxysmal A-fib, PAD and HTN    He was seen by podiatry 07/05/2023   Denies nausea or vomiting  Denies constipation or diarrhea    HOME DIABETES REGIMEN:  Metformin  500 mg XR 4 tabs daily  Repaglinide  1 mg , BID Jardiance  25 mg daily  Trulicity  4.5 mg weekly    Statin: Yes ACE-I/ARB: Yes   METER DOWNLOAD SUMMARY: n/a  DIABETIC COMPLICATIONS: Microvascular complications:  Neuropathy Denies: CKD Last Eye Exam: Completed 03/09/2021  Macrovascular complications:  CVA Denies: CAD, PVD   HISTORY:  Past Medical History:  Past Medical History:  Diagnosis Date   Angio-edema    Anxiety     Depression    Diabetes mellitus type II    type 2   GERD (gastroesophageal reflux disease)    Headache    migraine   History of kidney stones    HLD (hyperlipidemia)    HTN (hypertension)    Morbid obesity (HCC)    Paroxysmal atrial fibrillation (HCC)    Restless legs syndrome (RLS) 11/20/2012   Stroke (HCC) 02/14/2020   ischemic/right sided affected   Tobacco abuse    Ulcer of left ankle (HCC)    last 2 months dime size dry dressing changing q day   Past Surgical History:  Past Surgical History:  Procedure Laterality Date   Arm surgery Right 1969   Abscess excision and debridement at elbow   COLONOSCOPY WITH PROPOFOL  N/A 09/06/2016   Procedure: COLONOSCOPY WITH PROPOFOL ;  Surgeon: Victory LITTIE Legrand DOUGLAS, MD;  Location: [facility];  Service: Gastroenterology;  Laterality: N/A;   I & D EXTREMITY Left 05/29/2014   Procedure: IRRIGATION AND DEBRIDEMENT OF LEFT LEG WOUND AND PLACEMENT OF  INTEGRA  AND  VAC;  Surgeon: Estefana Reichert, DO;  Location: [facility];  Service: Plastics;  Laterality: Left;   implantable loop recorder implant  03/06/2020   Medtronic Reveal [place] model LNQ11 (SN  [id number]) implanted by Dr Kelsie for cryptogenic stroke   INCISION AND DRAINAGE OF WOUND Left 04/23/2014   Procedure: IRRIGATION AND DEBRIDEMENT LOWER LEFT LEG WOUND WITH PLACEMENT OF INTEGRA AND VAC;  Surgeon: Estefana Reichert, DO;  Location: [facility];  Service: Government Social Research Officer;  Laterality: Left;   kidney stone removal  2004   MINOR APPLICATION OF WOUND VAC Left 04/23/2014   Procedure: MINOR APPLICATION OF WOUND VAC;  Surgeon: Estefana Reichert, DO;  Location: [facility];  Service: Plastics;  Laterality: Left;   SKIN SPLIT GRAFT Left 06/18/2014   Procedure: SKIN GRAFT SPLIT THICKNESS TO LOWER LEFT LEG WOUND WITH PLACEMENT OF VAC;  Surgeon: Estefana Reichert, DO;  Location: [facility];  Service: Plastics;  Laterality: Left;   VASECTOMY  1994   Social History:   reports that he has quit smoking. His smoking use included e-cigarettes. He started smoking about 2 years ago. He has never used smokeless tobacco. He reports that he does not currently use alcohol . He reports that he does not use drugs. 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   Pistachio Nut (diagnostic) Hives, Shortness Of Breath, Itching, Swelling, Rash   Latex Dermatitis   Tape Other (See Comments)   Tears and bruises the skin   Betadine [povidone Iodine] Rash, Other (See Comments)   Raised rash 04/23/14   Lidocaine  Other (See Comments)   Minimal effect with lidocaine , prefers different anesthetic   Penicillins Rash, Other (See Comments)   Rash all over as a child        Medication List        Accurate as of July 07, 2023  2:36 PM. If you have any questions, ask your nurse or doctor.          STOP taking these medications    dapagliflozin  propanediol 10 MG Tabs tablet Commonly known as: FARXIGA  Stopped by: Polo Mcmartin J Sindi Beckworth   varenicline  1 MG tablet Commonly known as: CHANTIX  Stopped by: Magally Vahle J Quantae Martel       TAKE these medications    Acetaminophen  Extra Strength 500 MG Tabs Take 2 tablets (1,000 mg total) by mouth 2 (two) times daily as needed (for pain).   B-D SINGLE USE SWABS  REGULAR Pads Use to check blood sugar daily   buPROPion  150 MG 24 hr tablet Commonly known as: WELLBUTRIN  XL Take 1 tablet (150 mg  total) by mouth daily.   cetirizine 10 MG tablet Commonly known as: ZYRTEC Take 10 mg by mouth daily.   clonazePAM  0.5 MG tablet Commonly known as: KLONOPIN  TAKE 1/2 TO 1 TABLET(0.25 TO 0.5 MG) BY MOUTH TWICE DAILY AS NEEDED FOR ANXIETY   Daily-Vite Multivitamin Tabs Take 1 tablet by mouth daily.   diltiazem  30 MG tablet Commonly known as: CARDIZEM  TAKE 1 TABLET BY MOUTH EVERY 4 HOURS AS NEEDED FOR HEART RATE>100.   DULoxetine  30 MG capsule Commonly known as: CYMBALTA  Take 2 capsules (60 mg total) by mouth daily AND 1  capsule (30 mg total) at bedtime.   DULoxetine  60 MG capsule Commonly known as: CYMBALTA  Take 1 capsule (60 mg total) by mouth 2 (two) times daily.   empagliflozin  25 MG Tabs tablet Commonly known as: JARDIANCE  Take 1 tablet (25 mg total) by mouth daily before breakfast.   EPINEPHrine  0.3 mg/0.3 mL Soaj injection Commonly known as: EPI-PEN Inject 0.3 mg into the muscle as needed for anaphylaxis.   FLUoxetine  40 MG capsule Commonly known as: PROZAC  Take 2 capsules (80 mg total) by mouth daily.   FLUoxetine  40 MG capsule Commonly known as: PROZAC  Take 2 capsules (80 mg total) by mouth daily.   hydrochlorothiazide  12.5 MG tablet Commonly known as: HYDRODIURIL  Take 1 tablet (12.5 mg total) by mouth daily.   hydrochlorothiazide  25 MG tablet Commonly known as: HYDRODIURIL  Take 1 tablet (25 mg total) by mouth daily.   hydrochlorothiazide  25 MG tablet Commonly known as: HYDRODIURIL  Take 1 tablet (25 mg total) by mouth daily.   hydrOXYzine 10 MG tablet Commonly known as: ATARAX Take 10 mg by mouth 2 (two) times daily as needed.   LIBREXIA-AF apixaban  or placebo 5 mg Caps capsule Take 1 capsule (5 mg total) by mouth 2 (two) times daily. Take at approximately the same time of day with or without food. Please bring bottle back with you to every visit; do not discard bottle. Please contact [facility] for any questions or concerns regarding  this medication.   LIBREXIA-AF [id number] (Milvexian) or placebo 100 mg tablet Take 1 tablet by mouth 2 (two) times daily. Take at approximately the same time of day with or without food. Please bring bottle back with you to every visit; do not discard bottle. Please contact [facility] for any questions or concerns regarding this medication.   lisinopril  10 MG tablet Commonly known as: ZESTRIL  Take 1 tablet (10 mg total) by mouth daily.   lisinopril  10 MG tablet Commonly known as: ZESTRIL  Take 1 tablet (10 mg total) by mouth daily.   lisinopril  10 MG tablet Commonly known as: ZESTRIL  Take 1 tablet (10 mg total) by mouth daily.   metFORMIN  500 MG 24 hr tablet Commonly known as: GLUCOPHAGE -XR Take 4 tablets (2,000 mg total) by mouth daily in the afternoon.   metFORMIN  500 MG 24 hr tablet Commonly known as: GLUCOPHAGE -XR Take 4 tablets (2,000 mg total) by mouth daily in the afternoon.   ONE TOUCH ULTRA 2 w/Device Kit Use as directed once daily.   OneTouch Delica Plus Lancing Misc Use as directed in the morning, at noon, and at bedtime.   OneTouch Ultra Test test strip Generic drug: glucose blood Use as directed once daily.   pantoprazole  40 MG tablet Commonly known as: PROTONIX  Take 1 tablet (40 mg total) by mouth daily.   pantoprazole  40 MG tablet Commonly known as: PROTONIX  Take 1 tablet (40 mg total) by mouth daily.   pentoxifylline  400 MG CR tablet Commonly known as: TRENTAL  Take 1 tablet (400 mg total) by mouth 3 (three) times daily with meals.   pregabalin  300 MG capsule Commonly known as: LYRICA  TAKE 1 CAPSULE(300 MG) BY MOUTH TWICE DAILY   repaglinide  1 MG tablet Commonly known as: PRANDIN  Take 0.5 tablets (0.5 mg total) by mouth 2 (two) times daily before a meal. What changed: how much to take Changed by: [name] [name]   rOPINIRole  0.5 MG tablet Commonly known as: REQUIP  Take 1 tablet (0.5 mg total) by mouth 2 (two) times  daily.  rOPINIRole  0.5 MG tablet Commonly known as: REQUIP  TAKE 1 TABLET TWICE DAILY   rosuvastatin  20 MG tablet Commonly known as: CRESTOR  Take 1 tablet (20 mg total) by mouth at bedtime.   triamcinolone  cream 0.1 % Commonly known as: KENALOG  Apply 1 Application topically 2 (two) times daily.   TRUEplus Lancets 33G Misc Use to check blood sugar 2 times a day   OneTouch Delica Plus Lancet33G Misc Use as directed in the morning, at noon, and at bedtime.   Trulicity  4.5 MG/0.5ML Soaj Generic drug: Dulaglutide  Inject 4.5 mg into the skin once a week. What changed: Another medication with the same name was removed. Continue taking this medication, and follow the directions you see here. Changed by: [name] [name]         OBJECTIVE:   Vital Signs: BP 112/68 (BP Location: Left Arm, Patient Position: Sitting, Cuff Size: Normal)   Pulse 61   Ht 5' 10 (1.778 m)   Wt 300 lb (136.1 kg)   SpO2 98%   BMI 43.05 kg/m   Wt Readings from Last 3 Encounters:  07/07/23 300 lb (136.1 kg)  05/22/23 (!) 308 lb 2.1 oz (139.8 kg)  04/17/23 (!) 308 lb 2 oz (139.8 kg)     Exam: General: Pt appears well and is in NAD  Neck: General: Supple without adenopathy. Thyroid : Thyroid  size normal.  No goiter or nodules appreciated.   Lungs: Clear with good BS bilat   Heart: RRR   Extremities: B/L stasis dermatitis   Neuro: MS is good with appropriate affect, pt is alert and Ox3    DM foot exam: 07/05/2023 per podiatry      DATA REVIEWED:  Lab Results  Component Value Date   HGBA1C 6.8 (A) 07/07/2023   HGBA1C 7.8 (A) 03/21/2023   HGBA1C 6.6 (A) 10/28/2022     Latest Reference Range & Units 07/07/23 10:51  Sodium 135 - 146 mmol/L 141  Potassium 3.5 - 5.3 mmol/L 4.5  Chloride 98 - 110 mmol/L 104  CO2 20 - 32 mmol/L 27  Glucose 65 - 99 mg/dL 891 (H)  BUN 7 - 25 mg/dL 20  Creatinine 9.29 - 8.64 mg/dL 8.49 (H)  Calcium  8.6 - 10.3 mg/dL 89.9  BUN/Creatinine Ratio 6 - 22  (calc) 13  Total CHOL/HDL Ratio <5.0 (calc) 2.6  Cholesterol <200 mg/dL 896  HDL Cholesterol > OR = 40 mg/dL 39 (L)  LDL Cholesterol (Calc) mg/dL (calc) 39  MICROALB/CREAT RATIO <30 mg/g creat 22  Non-HDL Cholesterol (Calc) <130 mg/dL (calc) 64  Triglycerides <150 mg/dL 825 (H)    Latest Reference Range & Units 07/07/23 10:51  TSH 0.40 - 4.50 mIU/L 0.94  T4,Free(Direct) 0.8 - 1.8 ng/dL 1.7    Latest Reference Range & Units 07/07/23 10:51  Microalb, Ur mg/dL 1.5  MICROALB/CREAT RATIO <30 mg/g creat 22  Creatinine, Urine 20 - 320 mg/dL 67      In office BG 852 mg/dL   ASSESSMENT / PLAN / RECOMMENDATIONS:   1) Type 2 Diabetes Mellitus,Optimally controlled, With neuropathic and macrovascular complications - Most recent A1c of 6.8%. Goal A1c < 7.0 %.    -A1c is optimal with restarting Jardiance  and Trulicity  -I have recommended decreasing repaglinide  preemptively to prevent hypoglycemia  MEDICATIONS: Decrease repaglinide  1 mg , half a tablets before lunch and supper Continue Metformin  500 mg XR , 4 tablets daily  Continue Jardiance  25 mg daily Continue Trulicity  4.5 mg weekly   EDUCATION / INSTRUCTIONS: BG monitoring instructions: Patient  is instructed to check his blood sugars 1 times a day, fasting . Call [facility] if: BG persistently < 70  I reviewed the Rule of 15 for the treatment of hypoglycemia in detail with the patient. Literature supplied.    2) Diabetic complications:  Eye: Does not have known diabetic retinopathy.  Neuro/ Feet: Does  have known diabetic peripheral neuropathy .  Renal: Patient does not have known baseline CKD. He   is  on an ACEI/ARB at present.    3)Dyslipidemia:  -LDL at goal, triglycerides slightly elevated   Medication Continue rosuvastatin  20 mg daily   F/U 6 months   Signed electronically by: Stefano Redgie Butts, MD  [facility] Endocr[facility]ology  [facility] [address] [place], [place] [address] Phone: [phone] FAX: [phone]   CC: Watt Mirza, MD [address] [place] [address] Phone: [phone]  Fax: [phone]  Return to Endocrinology clinic as below: Future Appointments  Date Time Provider Department Center  07/19/2023  8:30 AM [facility] LAB [facility]  07/20/2023  3:35 PM Gerard Frederick, NP [facility] None  07/26/2023  9:40 AM Watt Mirza, MD [facility]  08/02/2023  1:00 PM Rosemarie Eather RAMAN, MD [facility] None  08/07/2023  8:25 AM [facility] DEVICE REMOTES [facility]  08/08/2023 10:30 AM Teresa Redell LABOR, NP [facility] None  09/04/2023 10:45 AM Loreda Hacker, DPM [facility]  09/11/2023  8:25 AM [facility] DEVICE REMOTES [facility]  10/16/2023  8:25 AM [facility] DEVICE REMOTES [facility]  11/20/2023  8:25 AM [facility] DEVICE REMOTES [facility]  12/25/2023  8:25 AM [facility] DEVICE REMOTES [facility]  01/05/2024 11:50 AM Rigby Leonhardt, [name] Redgie, MD [facility] None  01/30/2024  7:10 AM [facility] DEVICE REMOTES [facility]  03/04/2024  8:25 AM [facility] DEVICE REMOTES [facility]  04/08/2024  8:25 AM [facility] DEVICE REMOTES [facility]  05/13/2024  8:25 AM [facility] DEVICE REMOTES [facility]  06/17/2024  8:25 AM [facility] DEVICE REMOTES [facility]

## 2023-07-07 ENCOUNTER — Other Ambulatory Visit (HOSPITAL_COMMUNITY): Payer: Self-pay

## 2023-07-07 ENCOUNTER — Encounter: Payer: Self-pay | Admitting: Internal Medicine

## 2023-07-07 ENCOUNTER — Ambulatory Visit (INDEPENDENT_AMBULATORY_CARE_PROVIDER_SITE_OTHER): Payer: HMO | Admitting: Internal Medicine

## 2023-07-07 VITALS — BP 112/68 | HR 61 | Ht 70.0 in | Wt 300.0 lb

## 2023-07-07 DIAGNOSIS — E1142 Type 2 diabetes mellitus with diabetic polyneuropathy: Secondary | ICD-10-CM

## 2023-07-07 DIAGNOSIS — E11622 Type 2 diabetes mellitus with other skin ulcer: Secondary | ICD-10-CM

## 2023-07-07 DIAGNOSIS — E1159 Type 2 diabetes mellitus with other circulatory complications: Secondary | ICD-10-CM | POA: Diagnosis not present

## 2023-07-07 DIAGNOSIS — Z7985 Long-term (current) use of injectable non-insulin antidiabetic drugs: Secondary | ICD-10-CM | POA: Diagnosis not present

## 2023-07-07 DIAGNOSIS — E785 Hyperlipidemia, unspecified: Secondary | ICD-10-CM

## 2023-07-07 DIAGNOSIS — Z7984 Long term (current) use of oral hypoglycemic drugs: Secondary | ICD-10-CM | POA: Diagnosis not present

## 2023-07-07 LAB — POCT GLYCOSYLATED HEMOGLOBIN (HGB A1C): Hemoglobin A1C: 6.8 % — AB (ref 4.0–5.6)

## 2023-07-07 LAB — POCT GLUCOSE (DEVICE FOR HOME USE): POC Glucose: 147 mg/dL — AB (ref 70–99)

## 2023-07-07 MED ORDER — EMPAGLIFLOZIN 25 MG PO TABS
25.0000 mg | ORAL_TABLET | Freq: Every day | ORAL | 3 refills | Status: AC
  Filled 2023-07-07 – 2023-08-23 (×2): qty 90, 90d supply, fill #0
  Filled 2023-12-06: qty 90, 90d supply, fill #1
  Filled 2024-02-19 – 2024-02-21 (×2): qty 90, 90d supply, fill #2
  Filled 2024-05-27: qty 90, 90d supply, fill #3

## 2023-07-07 MED ORDER — TRULICITY 4.5 MG/0.5ML ~~LOC~~ SOAJ
4.5000 mg | SUBCUTANEOUS | 3 refills | Status: DC
  Filled 2023-07-07 – 2023-08-06 (×2): qty 6, 84d supply, fill #0
  Filled 2023-10-30: qty 6, 84d supply, fill #1

## 2023-07-07 MED ORDER — REPAGLINIDE 1 MG PO TABS: 0.5000 mg | ORAL_TABLET | Freq: Two times a day (BID) | ORAL | Status: DC

## 2023-07-07 MED ORDER — METFORMIN HCL ER 500 MG PO TB24
2000.0000 mg | ORAL_TABLET | Freq: Every day | ORAL | 3 refills | Status: DC
  Filled 2023-07-07 – 2023-09-07 (×2): qty 360, 90d supply, fill #0
  Filled 2023-12-06: qty 360, 90d supply, fill #1

## 2023-07-07 NOTE — Patient Instructions (Addendum)
-   Decrease Repaglinide  1 mg , Half a  tablet before Brunch and Supper  - Continue Metformin  500 mg XR , 4 tablets daily  -Continue Jardiance  25 mg daily - Continue Trulicity  4.5 mg weekly    HOW TO TREAT LOW BLOOD SUGARS (Blood sugar LESS THAN 70 MG/DL) Please follow the RULE OF 15 for the treatment of hypoglycemia treatment (when your (blood sugars are less than 70 mg/dL)   STEP 1: Take 15 grams of carbohydrates when your blood sugar is low, which includes:  3-4 GLUCOSE TABS  OR 3-4 OZ OF JUICE OR REGULAR SODA OR ONE TUBE OF GLUCOSE GEL    STEP 2: RECHECK blood sugar in 15 MINUTES STEP 3: If your blood sugar is still low at the 15 minute recheck --> then, go back to STEP 1 and treat AGAIN with another 15 grams of carbohydrates.

## 2023-07-08 ENCOUNTER — Encounter: Payer: Self-pay | Admitting: Family Medicine

## 2023-07-08 LAB — MICROALBUMIN / CREATININE URINE RATIO
Creatinine, Urine: 67 mg/dL (ref 20–320)
Microalb Creat Ratio: 22 mg/g{creat} (ref <30)
Microalb, Ur: 1.5 mg/dL

## 2023-07-08 LAB — LIPID PANEL
Cholesterol: 103 mg/dL (ref <200)
HDL: 39 mg/dL — ABNORMAL LOW (ref >=40)
LDL Cholesterol (Calc): 39 mg/dL
Non-HDL Cholesterol (Calc): 64 mg/dL (ref <130)
Total CHOL/HDL Ratio: 2.6 (calc) (ref <5.0)
Triglycerides: 174 mg/dL — ABNORMAL HIGH (ref <150)

## 2023-07-08 LAB — BASIC METABOLIC PANEL
BUN/Creatinine Ratio: 13 (calc) (ref 6–22)
BUN: 20 mg/dL (ref 7–25)
CO2: 27 mmol/L (ref 20–32)
Calcium: 10 mg/dL (ref 8.6–10.3)
Chloride: 104 mmol/L (ref 98–110)
Creat: 1.5 mg/dL — ABNORMAL HIGH (ref 0.70–1.35)
Glucose, Bld: 108 mg/dL — ABNORMAL HIGH (ref 65–99)
Potassium: 4.5 mmol/L (ref 3.5–5.3)
Sodium: 141 mmol/L (ref 135–146)

## 2023-07-08 LAB — TSH: TSH: 0.94 m[IU]/L (ref 0.40–4.50)

## 2023-07-08 LAB — T4, FREE: Free T4: 1.7 ng/dL (ref 0.8–1.8)

## 2023-07-10 ENCOUNTER — Encounter: Payer: Self-pay | Admitting: Internal Medicine

## 2023-07-10 DIAGNOSIS — E785 Hyperlipidemia, unspecified: Secondary | ICD-10-CM | POA: Insufficient documentation

## 2023-07-10 MED ORDER — ROPINIROLE HCL 0.5 MG PO TABS: 0.5000 mg | ORAL_TABLET | Freq: Two times a day (BID) | ORAL | Status: DC

## 2023-07-10 NOTE — Telephone Encounter (Signed)
 Can you check and see if we can add to the labs sent last week?

## 2023-07-12 ENCOUNTER — Other Ambulatory Visit: Payer: Self-pay | Admitting: Family Medicine

## 2023-07-12 ENCOUNTER — Other Ambulatory Visit: Payer: Self-pay

## 2023-07-12 ENCOUNTER — Other Ambulatory Visit (HOSPITAL_COMMUNITY): Payer: Self-pay

## 2023-07-12 MED ORDER — LANCETS MISC
1.0000 | Freq: Three times a day (TID) | 3 refills | Status: DC
  Filled 2023-07-12: qty 300, 90d supply, fill #0
  Filled 2023-10-10 – 2023-10-27 (×5): qty 300, 90d supply, fill #1
  Filled 2023-12-05: qty 300, 100d supply, fill #1
  Filled 2024-03-16 – 2024-06-26 (×2): qty 300, 100d supply, fill #2

## 2023-07-13 ENCOUNTER — Other Ambulatory Visit: Payer: Self-pay

## 2023-07-19 ENCOUNTER — Other Ambulatory Visit: Payer: HMO

## 2023-07-19 LAB — CUP PACEART REMOTE DEVICE CHECK
Date Time Interrogation Session: 20250203220420
Implantable Pulse Generator Implant Date: 20211008

## 2023-07-20 ENCOUNTER — Encounter: Payer: Self-pay | Admitting: Cardiovascular Disease

## 2023-07-20 ENCOUNTER — Ambulatory Visit: Payer: PPO | Admitting: Cardiology

## 2023-07-20 NOTE — Progress Notes (Deleted)
 Cardiology Office Note:  .   Date:  07/20/2023  ID:  [name] [name]., DOB 1960-10-01, MRN [id number] PCP: [name] Beat, MD  [name] [name] Cardiologist:  [name] Kendall, MD { Click to update primary MD,subspecialty MD or APP then REFRESH:1}   History of Present Illness: .   [name] [name]. is a 63 y.o. male with a past medical history of paroxysmal atrial fibrillation, peripheral arterial disease, hypertension, hyperlipidemia, type 2 diabetes, and stroke (01/2020), who is here today for follow-up on his paroxysmal atrial fibrillation.   Previously had undergone myocardial PET/CT and ABIs for further evaluation of chest and leg pains. Stress test was low risk without evidence of ischemia or scar. LVEF is mildly reduced at rest but normalized with stress. Myocardial blood flow reserve was normal. Lower extremity vascular studies did not show any significant narrowing in either leg. Subsequent echo was normal other than mild diastolic dysfunction.    He was last seen in clinic 02/07/2023 compromise wife and overall state he had been doing fairly well from the cardiac perspective.  He was little disheartened as he had been without [place], [place], or Trulicity due to cost constraints.  Blood pressure had been well-controlled.  He denied any hospitalizations or visits to the emergency department.  There were no medication changes that were made or further testing and that was ordered at that time.  He returns to clinic today  ROS: 10 point review of systems has been reviewed and considered negative exception was been listed in the HPI  Studies Reviewed: Marland Kitchen        TTE (09/20/2022): Normal LV size and wall thickness.  LVEF 55-60% with grade 1 diastolic dysfunction.  Normal RV size and function.  Normal biatrial size.  No pericardial effusion.  No significant valvular abnormalities.  Normal CVP.   ABIs and arterial Dopplers (08/12/2022): Bilateral lower extremity arteries  widely patent with no significant plaque.  ABIs 1.28 on the right and 1.24 on the left.  TBI 0.47 on the right and 0.59 on the left.   Myocardial PET/CT (08/09/2022): Normal, low risk study without evidence of ischemia or scar.  Mildly reduced LVEF at rest, which normalizes with stress.  Normal myocardial blood flow reserve.  Coronary artery calcifications present. Risk Assessment/Calculations:    CHA2DS2-VASc Score = 5  {Confirm score is correct.  If not, click here to update score.  REFRESH note.  :1} This indicates a 7.2% annual risk of stroke. The patient's score is based upon: CHF History: 0 HTN History: 1 Diabetes History: 1 Stroke History: 2 Vascular Disease History: 1 Age Score: 0 Gender Score: 0   {This patient has a significant risk of stroke if diagnosed with atrial fibrillation.  Please consider VKA or DOAC agent for anticoagulation if the bleeding risk is acceptable.   You can also use the SmartPhrase .[email or web address] for documentation.   :[id number]} No BP recorded.  {Refresh Note OR Click here to enter BP  :1}***       Physical Exam:   VS:  There were no vitals taken for this visit.   Wt Readings from Last 3 Encounters:  07/07/23 300 lb (136.1 kg)  05/22/23 (!) 308 lb 2.1 oz (139.8 kg)  04/17/23 (!) 308 lb 2 oz (139.8 kg)    GEN: Well nourished, well developed in no acute distress NECK: No JVD; No carotid bruits CARDIAC: ***RRR, no murmurs, rubs, gallops RESPIRATORY:  Clear to auscultation without rales, wheezing or rhonchi  ABDOMEN: Soft, non-tender,  non-distended EXTREMITIES:  No edema; No deformity   ASSESSMENT AND PLAN: .   Paroxysmal atrial fibrillation Primary hypertension Mixed hyperlipidemia Morbid obesity Former smoker    {Are you ordering a CV Procedure (e.g. stress test, cath, DCCV, TEE, etc)?   Press F2        :[id number]}  Dispo: ***  Signed, Baylei Siebels, NP

## 2023-07-21 ENCOUNTER — Other Ambulatory Visit (INDEPENDENT_AMBULATORY_CARE_PROVIDER_SITE_OTHER): Payer: PPO

## 2023-07-21 DIAGNOSIS — E1169 Type 2 diabetes mellitus with other specified complication: Secondary | ICD-10-CM

## 2023-07-21 DIAGNOSIS — I480 Paroxysmal atrial fibrillation: Secondary | ICD-10-CM | POA: Diagnosis not present

## 2023-07-21 DIAGNOSIS — E559 Vitamin D deficiency, unspecified: Secondary | ICD-10-CM | POA: Diagnosis not present

## 2023-07-21 DIAGNOSIS — Z79899 Other long term (current) drug therapy: Secondary | ICD-10-CM

## 2023-07-21 DIAGNOSIS — E785 Hyperlipidemia, unspecified: Secondary | ICD-10-CM

## 2023-07-21 DIAGNOSIS — E538 Deficiency of other specified B group vitamins: Secondary | ICD-10-CM | POA: Diagnosis not present

## 2023-07-21 DIAGNOSIS — Z125 Encounter for screening for malignant neoplasm of prostate: Secondary | ICD-10-CM

## 2023-07-21 DIAGNOSIS — E1159 Type 2 diabetes mellitus with other circulatory complications: Secondary | ICD-10-CM | POA: Diagnosis not present

## 2023-07-21 LAB — LIPID PANEL
Cholesterol: 104 mg/dL (ref 0–200)
HDL: 35.5 mg/dL — ABNORMAL LOW (ref >39.00)
LDL Cholesterol: 27 mg/dL (ref 0–99)
NonHDL: 68.36
Total CHOL/HDL Ratio: 3
Triglycerides: 205 mg/dL — ABNORMAL HIGH (ref 0.0–149.0)
VLDL: 41 mg/dL — ABNORMAL HIGH (ref 0.0–40.0)

## 2023-07-21 LAB — VITAMIN B12: Vitamin B-12: 934 pg/mL — ABNORMAL HIGH (ref 211–911)

## 2023-07-21 LAB — BASIC METABOLIC PANEL
BUN: 24 mg/dL — ABNORMAL HIGH (ref 6–23)
CO2: 30 meq/L (ref 19–32)
Calcium: 9.5 mg/dL (ref 8.4–10.5)
Chloride: 101 meq/L (ref 96–112)
Creatinine, Ser: 1.36 mg/dL (ref 0.40–1.50)
GFR: 55.85 mL/min — ABNORMAL LOW (ref >60.00)
Glucose, Bld: 127 mg/dL — ABNORMAL HIGH (ref 70–99)
Potassium: 4.1 meq/L (ref 3.5–5.1)
Sodium: 138 meq/L (ref 135–145)

## 2023-07-21 LAB — CBC WITH DIFFERENTIAL/PLATELET
Basophils Absolute: 0.1 10*3/uL (ref 0.0–0.1)
Basophils Relative: 0.8 % (ref 0.0–3.0)
Eosinophils Absolute: 0.2 10*3/uL (ref 0.0–0.7)
Eosinophils Relative: 1.8 % (ref 0.0–5.0)
HCT: 43.6 % (ref 39.0–52.0)
Hemoglobin: 14.5 g/dL (ref 13.0–17.0)
Lymphocytes Relative: 22.7 % (ref 12.0–46.0)
Lymphs Abs: 2 10*3/uL (ref 0.7–4.0)
MCHC: 33.2 g/dL (ref 30.0–36.0)
MCV: 90.6 fL (ref 78.0–100.0)
Monocytes Absolute: 0.8 10*3/uL (ref 0.1–1.0)
Monocytes Relative: 8.7 % (ref 3.0–12.0)
Neutro Abs: 5.9 10*3/uL (ref 1.4–7.7)
Neutrophils Relative %: 66 % (ref 43.0–77.0)
Platelets: 225 10*3/uL (ref 150.0–400.0)
RBC: 4.81 Mil/uL (ref 4.22–5.81)
RDW: 14.2 % (ref 11.5–15.5)
WBC: 8.9 10*3/uL (ref 4.0–10.5)

## 2023-07-21 LAB — MICROALBUMIN / CREATININE URINE RATIO
Creatinine,U: 59.6 mg/dL
Microalb Creat Ratio: 29.8 mg/g (ref 0.0–30.0)
Microalb, Ur: 1.8 mg/dL (ref 0.0–1.9)

## 2023-07-21 LAB — TSH: TSH: 1.55 u[IU]/mL (ref 0.35–5.50)

## 2023-07-21 LAB — HEPATIC FUNCTION PANEL
ALT: 16 U/L (ref 0–53)
AST: 15 U/L (ref 0–37)
Albumin: 4.3 g/dL (ref 3.5–5.2)
Alkaline Phosphatase: 32 U/L — ABNORMAL LOW (ref 39–117)
Bilirubin, Direct: 0.1 mg/dL (ref 0.0–0.3)
Total Bilirubin: 0.6 mg/dL (ref 0.2–1.2)
Total Protein: 6.7 g/dL (ref 6.0–8.3)

## 2023-07-21 LAB — VITAMIN D 25 HYDROXY (VIT D DEFICIENCY, FRACTURES): VITD: 54.29 ng/mL (ref 30.00–100.00)

## 2023-07-21 LAB — HEMOGLOBIN A1C: Hgb A1c MFr Bld: 6.6 % — ABNORMAL HIGH (ref 4.6–6.5)

## 2023-07-24 LAB — PSA, TOTAL WITH REFLEX TO PSA, FREE: PSA, Total: 0.5 ng/mL (ref <=4.0)

## 2023-07-26 ENCOUNTER — Encounter: Payer: PPO | Admitting: Family Medicine

## 2023-08-02 ENCOUNTER — Ambulatory Visit: Payer: Commercial Managed Care - HMO | Admitting: Neurology

## 2023-08-03 ENCOUNTER — Encounter: Payer: PPO | Admitting: Family Medicine

## 2023-08-03 ENCOUNTER — Encounter: Payer: Self-pay | Admitting: Family Medicine

## 2023-08-03 NOTE — Progress Notes (Deleted)
 [name] [name] T. Aiyla Baucom, MD, CAQ Sports Medicine [facility] at [facility] [address] [place], [address]  Phone: [phone]  FAX: [phone]  [name] [name]. - 62 y.o. male  MRN [id number]  Date of Birth: Jan 05, 1961  Date: 08/03/2023  PCP: [name] Beat, MD  Referral: [name] Beat, MD  No chief complaint on file.  Patient Care Team: [name] Beat, MD as PCP - General End, [name] Deer, MD as PCP - Cardiology (Cardiology) [name] Roberts, MD as Consulting Physician (Dermatology) [name] Murphy, MD as Consulting Physician (General Surgery) Subjective:   [name] [name]. is a 63 y.o. pleasant patient who presents for a medicare wellness examination:  Preventative Health Maintenance Visit:  Health Maintenance Summary Reviewed and updated, unless pt declines services.  Tobacco History Reviewed. Alcohol: No concerns, no excessive use Exercise Habits: Some activity, rec at least 30 mins 5 times a week STD concerns: no risk or activity to increase risk Drug Use: None  [name] [name] is a well-known patient, and he presents with Medicare wellness exam, and he has many different chronic medical problems we will discuss, as well.  History of stroke with residual deficits.  Morbid obesity, but he has lost greater than 100 pounds.  He also has severe diabetic neuropathy, and he has done at least reasonably better with some Lyrica and Cymbalta.  He has intermittently had severe depression.  He also has some anxiety.  He also has paroxysmal A-fib.  History of smoking.  Carotid artery disease.  History of panic attacks.  Medicare wellness exam Prevnar 20 COVID booster Eye exam  He is seeing endocrinology regarding his diabetes  Health Maintenance  Topic Date Due   Medicare Annual Wellness (AWV)  Never done   Pneumococcal Vaccine 18-77 Years old (2 of 2 - PCV) 04/20/2017   COVID-19 Vaccine (4 - 2024-25 season) 01/29/2023    OPHTHALMOLOGY EXAM  07/27/2023   HEMOGLOBIN A1C  01/18/2024   FOOT EXAM  07/06/2024   Diabetic kidney evaluation - eGFR measurement  07/20/2024   Diabetic kidney evaluation - Urine ACR  07/20/2024   DTaP/Tdap/Td (2 - Td or Tdap) 04/20/2026   Colonoscopy  09/07/2026   INFLUENZA VACCINE  Completed   Hepatitis C Screening  Completed   HIV Screening  Completed   Zoster Vaccines- Shingrix  Completed   HPV VACCINES  Aged Out    Immunization History  Administered Date(s) Administered   Influenza, Seasonal, Injecte, Preservative Fre 04/17/2023   Influenza,inj,Quad PF,6+ Mos 04/20/2016, 03/23/2017, 03/19/2018, 02/06/2019, 02/20/2020, 01/19/2022   Influenza-Unspecified 03/31/2015, 04/01/2021   Janssen (J&J) SARS-COV-2 Vaccination 09/27/2019, 04/17/2020   Moderna Covid-19 Vaccine Bivalent Booster 85yrs & up 04/01/2021   Pneumococcal Polysaccharide-23 11/25/2010, 04/20/2016   Tdap 04/20/2016   Zoster Recombinant(Shingrix) 04/06/2022, 07/25/2022    Patient Active Problem List   Diagnosis Date Noted   Ischemic stroke (HCC), occipital     Priority: High   Type 2 diabetes mellitus with vascular disease (HCC)     Priority: High   Morbid obesity (HCC) 09/29/2006    Priority: High   PAF (paroxysmal atrial fibrillation) (HCC) 08/31/2020    Priority: Medium    MDD (major depressive disorder), recurrent episode (HCC)     Priority: Medium    Diabetic polyneuropathy associated with type 2 diabetes mellitus (HCC) 06/30/2018    Priority: Medium    Circulation disorder of lower extremity 05/31/2015    Priority: Medium    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) 04/23/2014  Priority: Medium    Generalized anxiety disorder     Priority: Medium    Hyperlipidemia associated with type 2 diabetes mellitus (HCC) 01/16/2009    Priority: Medium    TOBACCO ABUSE 07/27/2007    Priority: Medium    Essential hypertension 09/29/2006    Priority: Medium    Dyslipidemia 07/10/2023   Skin  ulcer of third toe (HCC) 07/05/2023   Local infection of skin and subcutaneous tissue 05/22/2023   Claudication in peripheral vascular disease (HCC) 05/07/2022   Panic attacks 06/23/2020   Aortic calcification (HCC) 02/22/2020   Carotid artery calcification, bilateral 02/22/2020   GERD (gastroesophageal reflux disease)    Erectile dysfunction due to type 2 diabetes mellitus (HCC) 06/30/2018   IBS (irritable bowel syndrome) 12/24/2014   Restless legs syndrome (RLS) 11/20/2012   CALCULUS, KIDNEY 05/30/2002    Past Medical History:  Diagnosis Date   Angio-edema    Anxiety    Depression    Diabetes mellitus type II    type 2   GERD (gastroesophageal reflux disease)    Headache    migraine   History of kidney stones    HLD (hyperlipidemia)    HTN (hypertension)    Morbid obesity (HCC)    Paroxysmal atrial fibrillation (HCC)    Restless legs syndrome (RLS) 11/20/2012   Stroke (HCC) 02/14/2020   ischemic/right sided affected   Tobacco abuse    Ulcer of left ankle (HCC)    last 2 months dime size dry dressing changing q day    Past Surgical History:  Procedure Laterality Date   Arm surgery Right 1969   Abscess excision and debridement at elbow   COLONOSCOPY WITH PROPOFOL N/A 09/06/2016   Procedure: COLONOSCOPY WITH PROPOFOL;  Surgeon: Sherrilyn Rist, MD;  Location: [facility];  Service: Gastroenterology;  Laterality: N/A;   I & D EXTREMITY Left 05/29/2014   Procedure: IRRIGATION AND DEBRIDEMENT OF LEFT LEG WOUND AND PLACEMENT OF  INTEGRA  AND  VAC;  Surgeon: Wayland Denis, DO;  Location: [facility];  Service: Plastics;  Laterality: Left;   implantable loop recorder implant  03/06/2020   Medtronic Reveal [place] model LNQ11 (SN  [id number]) implanted by Dr Johney Frame for cryptogenic stroke   INCISION AND DRAINAGE OF WOUND Left 04/23/2014   Procedure: IRRIGATION AND DEBRIDEMENT LOWER LEFT LEG WOUND WITH PLACEMENT OF INTEGRA AND VAC;  Surgeon: Wayland Denis, DO;   Location: [facility];  Service: Plastics;  Laterality: Left;   kidney stone removal  2004   MINOR APPLICATION OF WOUND VAC Left 04/23/2014   Procedure: MINOR APPLICATION OF WOUND VAC;  Surgeon: Wayland Denis, DO;  Location: [facility];  Service: Plastics;  Laterality: Left;   SKIN SPLIT GRAFT Left 06/18/2014   Procedure: SKIN GRAFT SPLIT THICKNESS TO LOWER LEFT LEG WOUND WITH PLACEMENT OF VAC;  Surgeon: Wayland Denis, DO;  Location: [facility];  Service: Plastics;  Laterality: Left;   VASECTOMY  1994    Family History  Problem Relation Age of Onset   Depression Mother    Anxiety disorder Mother    Stroke Mother        multiple   Leukemia Mother    Pneumonia Mother    Cancer Mother    Diabetes Mother    Diabetes Father    Heart disease Father    Hyperlipidemia Father    Hypertension Father    Allergic rhinitis Neg Hx    Angioedema Neg Hx  Asthma Neg Hx    Eczema Neg Hx    Immunodeficiency Neg Hx    Urticaria Neg Hx     Social History   Social History Narrative   Lives with wife   Right Handed   Drink 6-7 cups caffeine daily    Past Medical History, Surgical History, Social History, Family History, Problem List, Medications, and Allergies have been reviewed and updated if relevant.  Review of Systems: Pertinent positives are listed above.  Otherwise, a full 14 point review of systems has been done in full and it is negative except where it is noted positive.  Objective:   There were no vitals taken for this visit.    02/14/2020   11:41 AM 02/15/2020    3:00 AM 02/15/2020    8:00 AM 07/25/2022    9:32 AM  Fall Risk  Falls in the past year?    1  Was there an injury with Fall?    1  Fall Risk Category Calculator    3  (RETIRED) Patient Fall Risk Level Moderate fall risk Low fall risk Low fall risk   Patient at Risk for Falls Due to    History of fall(s)  Fall risk Follow up    Falls evaluation completed   Ideal Body  Weight:   No results found.    04/17/2023   10:22 AM 08/30/2022   11:41 AM 07/25/2022    9:32 AM 01/19/2022    9:59 AM 07/22/2021    9:08 AM  Depression screen PHQ 2/9  Decreased Interest 1  2 2 2   Down, Depressed, Hopeless 1  2 2 3   PHQ - 2 Score 2  4 4 5   Altered sleeping 2  1 2 3   Tired, decreased energy 1  1 2 3   Change in appetite 1  2 1 2   Feeling bad or failure about yourself  1  1 0 2  Trouble concentrating 1  2 3 3   Moving slowly or fidgety/restless 0  0 3 3  Suicidal thoughts 0  1 0 2  PHQ-9 Score 8  12 15 23   Difficult doing work/chores Somewhat difficult  Very difficult Very difficult Extremely dIfficult     Information is confidential and restricted. Go to Review Flowsheets to unlock data.     GEN: well developed, well nourished, no acute distress Eyes: conjunctiva and lids normal, PERRLA, EOMI ENT: TM clear, nares clear, oral exam WNL Neck: supple, no lymphadenopathy, no thyromegaly, no JVD Pulm: clear to auscultation and percussion, respiratory effort normal CV: regular rate and rhythm, S1-S2, no murmur, rub or gallop, no bruits, peripheral pulses normal and symmetric, no cyanosis, clubbing, edema or varicosities GI: soft, non-tender; no hepatosplenomegaly, masses; active bowel sounds all quadrants GU: deferred Lymph: no cervical, axillary or inguinal adenopathy MSK: gait normal, muscle tone and strength WNL, no joint swelling, effusions, discoloration, crepitus  SKIN: clear, good turgor, color WNL, no rashes, lesions, or ulcerations Neuro: normal mental status, normal strength, sensation, and motion Psych: alert; oriented to person, place and time, normally interactive and not anxious or depressed in appearance.  All labs reviewed with patient.  Results for orders placed or performed in visit on 07/21/23  VITAMIN D 25 Hydroxy (Vit-D Deficiency, Fractures)   Collection Time: 07/21/23  8:42 AM  Result Value Ref Range   VITD 54.29 30.00 - 100.00 ng/mL  TSH    Collection Time: 07/21/23  8:42 AM  Result Value Ref Range   TSH 1.55 0.35 -  5.50 uIU/mL  Vitamin B12   Collection Time: 07/21/23  8:42 AM  Result Value Ref Range   Vitamin B-12 934 (H) 211 - 911 pg/mL  PSA, Total with Reflex to PSA, Free   Collection Time: 07/21/23  8:42 AM  Result Value Ref Range   PSA, Total 0.5 < OR = 4.0 ng/mL  Hepatic Function Panel   Collection Time: 07/21/23  8:42 AM  Result Value Ref Range   Total Bilirubin 0.6 0.2 - 1.2 mg/dL   Bilirubin, Direct 0.1 0.0 - 0.3 mg/dL   Alkaline Phosphatase 32 (L) 39 - 117 U/L   AST 15 0 - 37 U/L   ALT 16 0 - 53 U/L   Total Protein 6.7 6.0 - 8.3 g/dL   Albumin 4.3 3.5 - 5.2 g/dL  Basic metabolic panel   Collection Time: 07/21/23  8:42 AM  Result Value Ref Range   Sodium 138 135 - 145 mEq/L   Potassium 4.1 3.5 - 5.1 mEq/L   Chloride 101 96 - 112 mEq/L   CO2 30 19 - 32 mEq/L   Glucose, Bld 127 (H) 70 - 99 mg/dL   BUN 24 (H) 6 - 23 mg/dL   Creatinine, Ser 1.61 0.40 - 1.50 mg/dL   GFR 09.60 (L) >45.40 mL/min   Calcium 9.5 8.4 - 10.5 mg/dL  CBC with Differential/Platelet   Collection Time: 07/21/23  8:42 AM  Result Value Ref Range   WBC 8.9 4.0 - 10.5 K/uL   RBC 4.81 4.22 - 5.81 Mil/uL   Hemoglobin 14.5 13.0 - 17.0 g/dL   HCT 98.1 19.1 - 47.8 %   MCV 90.6 78.0 - 100.0 fl   MCHC 33.2 30.0 - 36.0 g/dL   RDW 29.5 62.1 - 30.8 %   Platelets 225.0 150.0 - 400.0 K/uL   Neutrophils Relative % 66.0 43.0 - 77.0 %   Lymphocytes Relative 22.7 12.0 - 46.0 %   Monocytes Relative 8.7 3.0 - 12.0 %   Eosinophils Relative 1.8 0.0 - 5.0 %   Basophils Relative 0.8 0.0 - 3.0 %   Neutro Abs 5.9 1.4 - 7.7 K/uL   Lymphs Abs 2.0 0.7 - 4.0 K/uL   Monocytes Absolute 0.8 0.1 - 1.0 K/uL   Eosinophils Absolute 0.2 0.0 - 0.7 K/uL   Basophils Absolute 0.1 0.0 - 0.1 K/uL  Microalbumin / creatinine urine ratio   Collection Time: 07/21/23  8:42 AM  Result Value Ref Range   Microalb, Ur 1.8 0.0 - 1.9 mg/dL   Creatinine,U 65.7 mg/dL    Microalb Creat Ratio 29.8 0.0 - 30.0 mg/g  Lipid panel   Collection Time: 07/21/23  8:42 AM  Result Value Ref Range   Cholesterol 104 0 - 200 mg/dL   Triglycerides 846.9 (H) 0.0 - 149.0 mg/dL   HDL 62.95 (L) >28.41 mg/dL   VLDL 32.4 (H) 0.0 - 40.1 mg/dL   LDL Cholesterol 27 0 - 99 mg/dL   Total CHOL/HDL Ratio 3    NonHDL 68.36   Hemoglobin A1c   Collection Time: 07/21/23  8:42 AM  Result Value Ref Range   Hgb A1c MFr Bld 6.6 (H) 4.6 - 6.5 %    Assessment and Plan:     ICD-10-CM   1. Healthcare maintenance  Z00.00       Health Maintenance Exam: The patient's preventative maintenance and recommended screening tests for an annual wellness exam were reviewed in full today. Brought up to date unless services declined.  Counselled on the importance  of diet, exercise, and its role in overall health and mortality. The patient's FH and SH was reviewed, including their home life, tobacco status, and drug and alcohol status.  Follow-up in 1 year for physical exam or additional follow-up below.  I have personally reviewed the Medicare Annual Wellness questionnaire and have noted 1. The patient's medical and social history 2. Their use of alcohol, tobacco or illicit drugs 3. Their current medications and supplements 4. The patient's functional ability including ADL's, fall risks, home safety risks and hearing or visual             impairment. 5. Diet and physical activities 6. Evidence for depression or mood disorders 7. Reviewed Updated provider list, see scanned forms and [facility] Snapshot.  8. Reviewed whether or not the patient has HCPOA or living will, and discussed what this means with the patient.  Recommended he bring in a copy for his chart in [facility].  The patients weight, height, BMI and visual acuity have been recorded in the chart I have made referrals, counseling and provided education to the patient based review of the above and I have provided the pt with a written personalized  care plan for preventive services.  I have provided the patient with a copy of your personalized plan for preventive services. Instructed to take the time to review along with their updated medication list.  Disposition: No follow-ups on file.  Future Appointments  Date Time Provider Department Center  08/03/2023  2:40 PM [name] Beat, MD [facility]  08/07/2023  8:25 AM [facility] DEVICE REMOTES [facility]  08/08/2023 10:30 AM Joan Flores, NP [facility] None  09/04/2023 10:45 AM Helane Gunther, DPM [facility]  09/08/2023 11:30 AM Ihor Austin, NP [facility] None  09/11/2023  8:25 AM [facility] DEVICE REMOTES [facility]  10/16/2023  8:25 AM [facility] DEVICE REMOTES [facility]  11/20/2023  8:25 AM [facility] DEVICE REMOTES [facility]  12/25/2023  8:25 AM [facility] DEVICE REMOTES [facility]  01/05/2024 11:50 AM Shamleffer, Konrad Dolores, MD [facility] None  01/30/2024  7:10 AM [facility] DEVICE REMOTES [facility]  03/04/2024  8:25 AM [facility] DEVICE REMOTES [facility]  04/08/2024  8:25 AM [facility] DEVICE REMOTES [facility]  05/13/2024  8:25 AM [facility] DEVICE REMOTES [facility]  06/17/2024  8:25 AM [facility] DEVICE REMOTES [facility]    No orders of the defined types were placed in this encounter.  There are no discontinued medications. No orders of the defined types were placed in this encounter.   Signed,  Elpidio Galea. 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   Pistachio Nut (diagnostic) Hives, Shortness Of Breath, Itching, Swelling, Rash   Latex Dermatitis   Tape Other (See Comments)   Tears and bruises the skin   Betadine [povidone Iodine] Rash, Other (See Comments)   Raised rash 04/23/14   Lidocaine Other (See Comments)   Minimal effect  with lidocaine, prefers different anesthetic   Penicillins Rash, Other (See Comments)   Rash all over as a child        Medication List        Accurate as of August 03, 2023  8:30 AM. If you have any questions, ask your nurse or doctor.          Acetaminophen Extra Strength 500 MG Tabs Take 2 tablets (1,000 mg total) by mouth 2 (two) times daily as  needed (for pain).   buPROPion 150 MG 24 hr tablet Commonly known as: WELLBUTRIN XL Take 1 tablet (150 mg total) by mouth daily.   cetirizine 10 MG tablet Commonly known as: ZYRTEC Take 10 mg by mouth daily.   clonazePAM 0.5 MG tablet Commonly known as: KLONOPIN TAKE 1/2 TO 1 TABLET(0.25 TO 0.5 MG) BY MOUTH TWICE DAILY AS NEEDED FOR ANXIETY   Daily-Vite Multivitamin Tabs Take 1 tablet by mouth daily.   diltiazem 30 MG tablet Commonly known as: CARDIZEM TAKE 1 TABLET BY MOUTH EVERY 4 HOURS AS NEEDED FOR HEART RATE>100.   DULoxetine 30 MG capsule Commonly known as: CYMBALTA Take 2 capsules (60 mg total) by mouth daily AND 1 capsule (30 mg total) at bedtime.   DULoxetine 60 MG capsule Commonly known as: CYMBALTA Take 1 capsule (60 mg total) by mouth 2 (two) times daily.   Easy Touch Alcohol Prep Medium 70 % Pads Use to check blood sugar daily   empagliflozin 25 MG Tabs tablet Commonly known as: JARDIANCE Take 1 tablet (25 mg total) by mouth daily before breakfast.   EPINEPHrine 0.3 mg/0.3 mL Soaj injection Commonly known as: EPI-PEN Inject 0.3 mg into the muscle as needed for anaphylaxis.   FLUoxetine 40 MG capsule Commonly known as: PROZAC Take 2 capsules (80 mg total) by mouth daily.   FLUoxetine 40 MG capsule Commonly known as: PROZAC Take 2 capsules (80 mg total) by mouth daily.   hydrochlorothiazide 12.5 MG tablet Commonly known as: HYDRODIURIL Take 1 tablet (12.5 mg total) by mouth daily.   hydrochlorothiazide 25 MG tablet Commonly known as: HYDRODIURIL Take 1 tablet (25 mg total) by mouth daily.    hydrochlorothiazide 25 MG tablet Commonly known as: HYDRODIURIL Take 1 tablet (25 mg total) by mouth daily.   hydrOXYzine 10 MG tablet Commonly known as: ATARAX Take 10 mg by mouth 2 (two) times daily as needed.   LIBREXIA-AF apixaban or placebo 5 mg Caps capsule Take 1 capsule (5 mg total) by mouth 2 (two) times daily. Take at approximately the same time of day with or without food. Please bring bottle back with you to every visit; do not discard bottle. Please contact [facility] for any questions or concerns regarding this medication.   LIBREXIA-AF [id number] (Milvexian) or placebo 100 mg tablet Take 1 tablet by mouth 2 (two) times daily. Take at approximately the same time of day with or without food. Please bring bottle back with you to every visit; do not discard bottle. Please contact [facility] for any questions or concerns regarding this medication.   lisinopril 10 MG tablet Commonly known as: ZESTRIL Take 1 tablet (10 mg total) by mouth daily.   lisinopril 10 MG tablet Commonly known as: ZESTRIL Take 1 tablet (10 mg total) by mouth daily.   lisinopril 10 MG tablet Commonly known as: ZESTRIL Take 1 tablet (10 mg total) by mouth daily.   metFORMIN 500 MG 24 hr tablet Commonly known as: GLUCOPHAGE-XR Take 4 tablets (2,000 mg total) by mouth daily in the afternoon.   metFORMIN 500 MG 24 hr tablet Commonly known as: GLUCOPHAGE-XR Take 4 tablets (2,000 mg total) by mouth daily in the afternoon.   ONE TOUCH ULTRA 2 w/Device Kit Use as directed once daily.   OneTouch Ultra Test test strip Generic drug: glucose blood Use as directed once daily.   pantoprazole 40 MG tablet Commonly known as: PROTONIX Take 1 tablet (40 mg total) by mouth daily.   pantoprazole  40 MG tablet Commonly known as: PROTONIX Take 1 tablet (40 mg total) by mouth daily.   pentoxifylline 400 MG CR tablet Commonly known as: TRENTAL Take 1 tablet (400 mg total)  by mouth 3 (three) times daily with meals.   pregabalin 300 MG capsule Commonly known as: LYRICA TAKE 1 CAPSULE(300 MG) BY MOUTH TWICE DAILY   repaglinide 1 MG tablet Commonly known as: PRANDIN Take 0.5 tablets (0.5 mg total) by mouth 2 (two) times daily before a meal.   rOPINIRole 0.5 MG tablet Commonly known as: REQUIP Take 1 tablet (0.5 mg total) by mouth 2 (two) times daily.   rosuvastatin 20 MG tablet Commonly known as: CRESTOR Take 1 tablet (20 mg total) by mouth at bedtime.   triamcinolone cream 0.1 % Commonly known as: KENALOG Apply 1 Application topically 2 (two) times daily.   TRUEplus Lancets 33G Misc Use to check blood sugar 2 times a day   OneTouch Delica Plus Lancet33G Misc Use as directed in the morning, at noon, and at bedtime.   Trulicity 4.5 MG/0.5ML Soaj Generic drug: Dulaglutide Inject 4.5 mg into the skin once a week.

## 2023-08-06 ENCOUNTER — Encounter: Payer: Self-pay | Admitting: Podiatry

## 2023-08-07 ENCOUNTER — Ambulatory Visit (INDEPENDENT_AMBULATORY_CARE_PROVIDER_SITE_OTHER): Payer: Medicare HMO

## 2023-08-07 ENCOUNTER — Other Ambulatory Visit: Payer: Self-pay

## 2023-08-07 ENCOUNTER — Other Ambulatory Visit (HOSPITAL_COMMUNITY): Payer: Self-pay

## 2023-08-07 DIAGNOSIS — I639 Cerebral infarction, unspecified: Secondary | ICD-10-CM

## 2023-08-07 LAB — CUP PACEART REMOTE DEVICE CHECK
Date Time Interrogation Session: 20250309230734
Implantable Pulse Generator Implant Date: 20211008

## 2023-08-07 MED ORDER — DOXYCYCLINE HYCLATE 100 MG PO TABS
100.0000 mg | ORAL_TABLET | Freq: Two times a day (BID) | ORAL | 0 refills | Status: DC
  Filled 2023-08-07: qty 28, 14d supply, fill #0

## 2023-08-08 ENCOUNTER — Encounter (HOSPITAL_COMMUNITY): Payer: Self-pay

## 2023-08-08 ENCOUNTER — Other Ambulatory Visit (HOSPITAL_COMMUNITY): Payer: Self-pay

## 2023-08-08 ENCOUNTER — Encounter: Payer: Self-pay | Admitting: Behavioral Health

## 2023-08-08 ENCOUNTER — Other Ambulatory Visit (HOSPITAL_COMMUNITY): Payer: Self-pay | Admitting: Pharmacist

## 2023-08-08 ENCOUNTER — Telehealth (INDEPENDENT_AMBULATORY_CARE_PROVIDER_SITE_OTHER): Payer: Medicare HMO | Admitting: Behavioral Health

## 2023-08-08 ENCOUNTER — Other Ambulatory Visit: Payer: Self-pay

## 2023-08-08 DIAGNOSIS — F331 Major depressive disorder, recurrent, moderate: Secondary | ICD-10-CM

## 2023-08-08 DIAGNOSIS — F411 Generalized anxiety disorder: Secondary | ICD-10-CM

## 2023-08-08 MED ORDER — STUDY - LIBREXIA-AF - JNJ-70033093 (MILVEXIAN) 100 MG OR PLACEBO (PI-SETHI): 1.0000 | ORAL_TABLET | Freq: Two times a day (BID) | ORAL | 0 refills | Status: DC

## 2023-08-08 MED ORDER — STUDY - LIBREXIA-AF - APIXABAN 5 MG OR PLACEBO CAPSULE (PI-SETHI): 5.0000 mg | ORAL_CAPSULE | Freq: Two times a day (BID) | ORAL | 0 refills | Status: DC

## 2023-08-08 MED ORDER — BUPROPION HCL ER (XL) 150 MG PO TB24
150.0000 mg | ORAL_TABLET | Freq: Every day | ORAL | 1 refills | Status: DC
  Filled 2023-08-08 – 2023-09-04 (×2): qty 90, 90d supply, fill #0
  Filled 2023-12-04: qty 90, 90d supply, fill #1

## 2023-08-08 MED ORDER — CLONAZEPAM 0.5 MG PO TABS
0.5000 mg | ORAL_TABLET | Freq: Two times a day (BID) | ORAL | 0 refills | Status: DC
  Filled 2023-08-08: qty 60, 30d supply, fill #0

## 2023-08-08 NOTE — Progress Notes (Signed)
 [name] [name] [id number] 16-Feb-1961 63 y.o.  Virtual Visit via Video Note  I connected with pt @ on 08/08/23 at 10:30 AM EDT by a video enabled telemedicine application and verified that I am speaking with the correct person using two identifiers.   I discussed the limitations of evaluation and management by telemedicine and the availability of in person appointments. The patient expressed understanding and agreed to proceed.  I discussed the assessment and treatment plan with the patient. The patient was provided an opportunity to ask questions and all were answered. The patient agreed with the plan and demonstrated an understanding of the instructions.   The patient was advised to call back or seek an in-person evaluation if the symptoms worsen or if the condition fails to improve as anticipated.  I provided 30 minutes of non-face-to-face time during this encounter.  The patient was located at home.  The provider was located at [facility].   Joan Flores, NP   Subjective:   Patient ID:  [name] [name]. is a 63 y.o. (DOB 16-Sep-1960) male.  Chief Complaint: No chief complaint on file.   HPI "[name] [name]", 63 year old male presents to this office via video visit for  follow up and medication management.  Says he has been having difficult time last few day. Pet dog died and he is disturbed by the condition in the world. Has been watching to much news recently.  Has had to use a little more klonopin recently but is using responsibly.  Numerically rates his anxiety today at 5/10, and depression at 4/10.  He is sleeping 7 to 8 hours per night.  He denies any history of mania, no psychosis, no auditory or visual hallucinations.  Denies SI or HI.  Has strong family support from his wife and daughters.   Past psychiatric medication trials: Klonopin Prozac Duloxetine     Review of Systems:  Review of Systems  Constitutional: Negative.   Allergic/Immunologic: Negative.    Psychiatric/Behavioral:  Positive for dysphoric mood. The patient is nervous/anxious.     Medications: I have reviewed the patient's current medications.  Current Outpatient Medications  Medication Sig Dispense Refill   acetaminophen (TYLENOL) 500 MG tablet Take 2 tablets (1,000 mg total) by mouth 2 (two) times daily as needed (for pain). 120 tablet 3   Alcohol Swabs (B-D SINGLE USE SWABS REGULAR) PADS Use to check blood sugar daily 100 each 3   Blood Glucose Monitoring Suppl (BLOOD GLUCOSE MONITOR SYSTEM) w/Device KIT Use as directed once daily. 1 kit 0   buPROPion (WELLBUTRIN XL) 150 MG 24 hr tablet Take 1 tablet (150 mg total) by mouth daily. 90 tablet 1   cetirizine (ZYRTEC) 10 MG tablet Take 10 mg by mouth daily.     clonazePAM (KLONOPIN) 0.5 MG tablet Take 1 tablet (0.5 mg total) by mouth 2 (two) times daily. 60 tablet 0   diltiazem (CARDIZEM) 30 MG tablet TAKE 1 TABLET BY MOUTH EVERY 4 HOURS AS NEEDED FOR HEART RATE>100. 90 tablet 0   doxycycline (VIBRA-TABS) 100 MG tablet Take 1 tablet (100 mg total) by mouth 2 (two) times daily for 14 days. 28 tablet 0   Dulaglutide (TRULICITY) 4.5 MG/0.5ML SOAJ Inject 4.5 mg into the skin once a week. 6 mL 3   DULoxetine (CYMBALTA) 30 MG capsule Take 2 capsules (60 mg total) by mouth daily AND 1 capsule (30 mg total) at bedtime. 90 capsule 0   DULoxetine (CYMBALTA) 60 MG capsule Take 1 capsule (60 mg  total) by mouth 2 (two) times daily. 180 capsule 1   empagliflozin (JARDIANCE) 25 MG TABS tablet Take 1 tablet (25 mg total) by mouth daily before breakfast. 90 tablet 3   EPINEPHrine 0.3 mg/0.3 mL IJ SOAJ injection Inject 0.3 mg into the muscle as needed for anaphylaxis. 2 each 0   FLUoxetine (PROZAC) 40 MG capsule Take 2 capsules (80 mg total) by mouth daily. 180 capsule 1   FLUoxetine (PROZAC) 40 MG capsule Take 2 capsules (80 mg total) by mouth daily. 180 capsule 0   Glucose Blood (BLOOD GLUCOSE TEST STRIPS) STRP Use as directed once daily. 90 each  0   hydrochlorothiazide (HYDRODIURIL) 12.5 MG tablet Take 1 tablet (12.5 mg total) by mouth daily. 90 tablet 1   hydrochlorothiazide (HYDRODIURIL) 25 MG tablet Take 1 tablet (25 mg total) by mouth daily. 90 tablet 3   hydrochlorothiazide (HYDRODIURIL) 25 MG tablet Take 1 tablet (25 mg total) by mouth daily. 90 tablet 3   hydrOXYzine (ATARAX) 10 MG tablet Take 10 mg by mouth 2 (two) times daily as needed.     Lancets MISC Use as directed in the morning, at noon, and at bedtime. 300 each 3   lisinopril (ZESTRIL) 10 MG tablet Take 1 tablet (10 mg total) by mouth daily. 90 tablet 3   lisinopril (ZESTRIL) 10 MG tablet Take 1 tablet (10 mg total) by mouth daily. 90 tablet 3   lisinopril (ZESTRIL) 10 MG tablet Take 1 tablet (10 mg total) by mouth daily. 90 tablet 3   metFORMIN (GLUCOPHAGE-XR) 500 MG 24 hr tablet Take 4 tablets (2,000 mg total) by mouth daily in the afternoon. 360 tablet 3   metFORMIN (GLUCOPHAGE-XR) 500 MG 24 hr tablet Take 4 tablets (2,000 mg total) by mouth daily in the afternoon. 360 tablet 3   Multiple Vitamin (MULTIVITAMIN) tablet Take 1 tablet by mouth daily. 100 tablet 3   pantoprazole (PROTONIX) 40 MG tablet Take 1 tablet (40 mg total) by mouth daily. 90 tablet 3   pantoprazole (PROTONIX) 40 MG tablet Take 1 tablet (40 mg total) by mouth daily. 90 tablet 0   pentoxifylline (TRENTAL) 400 MG CR tablet Take 1 tablet (400 mg total) by mouth 3 (three) times daily with meals. 270 tablet 0   pregabalin (LYRICA) 300 MG capsule TAKE 1 CAPSULE(300 MG) BY MOUTH TWICE DAILY 180 capsule 1   repaglinide (PRANDIN) 1 MG tablet Take 0.5 tablets (0.5 mg total) by mouth 2 (two) times daily before a meal.     rOPINIRole (REQUIP) 0.5 MG tablet Take 1 tablet (0.5 mg total) by mouth 2 (two) times daily.     rosuvastatin (CRESTOR) 20 MG tablet Take 1 tablet (20 mg total) by mouth at bedtime. 90 tablet 0   STUDY - LIBREXIA-AF - apixaban 5 mg or placebo capsule (PI-Sethi) Take 1 capsule (5 mg total) by  mouth 2 (two) times daily. Take at approximately the same time of day with or without food. Please bring bottle back with you to every visit; do not discard bottle. Please contact [facility] for any questions or concerns regarding this medication. 280 capsule 0   STUDY - LIBREXIA-AF - [id number] (Milvexian) 100 mg or placebo tablet (PI-Sethi) Take 1 tablet by mouth 2 (two) times daily. Take at approximately the same time of day with or without food. Please bring bottle back with you to every visit; do not discard bottle. Please contact [facility] for any questions or concerns regarding this medication. 280  tablet 0   triamcinolone cream (KENALOG) 0.1 % Apply 1 Application topically 2 (two) times daily. 454 g 1   TRUEplus Lancets 33G MISC Use to check blood sugar 2 times a day 200 each 3   No current facility-administered medications for this visit.    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   Pistachio Nut (Diagnostic) Hives, Shortness Of Breath, Itching, Swelling and Rash   Latex Dermatitis   Tape Other (See Comments)    Tears and bruises the skin   Betadine [Povidone Iodine] Rash and Other (See Comments)    Raised rash 04/23/14   Lidocaine Other (See Comments)    Minimal effect with lidocaine, prefers different anesthetic   Penicillins Rash and Other (See Comments)    Rash all over as a child    Past Medical History:  Diagnosis Date   Angio-edema    Anxiety    Depression    Diabetes mellitus type II    type 2   GERD (gastroesophageal reflux disease)    Headache    migraine   History of kidney stones    HLD (hyperlipidemia)    HTN (hypertension)    Morbid obesity (HCC)    Paroxysmal atrial fibrillation (HCC)    Restless legs syndrome (RLS) 11/20/2012   Stroke (HCC) 02/14/2020   ischemic/right sided affected   Tobacco abuse    Ulcer of left  ankle (HCC)     Family History  Problem Relation Age of Onset   Depression Mother    Anxiety disorder Mother    Stroke Mother        multiple   Leukemia Mother    Pneumonia Mother    Cancer Mother    Diabetes Mother    Diabetes Father    Heart disease Father    Hyperlipidemia Father    Hypertension Father    Allergic rhinitis Neg Hx    Angioedema Neg Hx    Asthma Neg Hx    Eczema Neg Hx    Immunodeficiency Neg Hx    Urticaria Neg Hx     Social History   Socioeconomic History   Marital status: Married    Spouse name: pike scantlebury   Number of children: 3   Years of education: Not on file   Highest education level: 12th grade  Occupational History   Occupation: Therapist, nutritional and receivables    Comment: no longer working 07/07/20  Tobacco Use   Smoking status: Former    Types: E-cigarettes    Start date: 05/05/2021   Smokeless tobacco: Never   Tobacco comments:    Not smoking currently  Vaping Use   Vaping status: Every Day   Substances: Flavoring  Substance and Sexual Activity   Alcohol use: Not Currently    Alcohol/week: 0.0 standard drinks of alcohol    Comment: rarely   Drug use: No   Sexual activity: Yes    Partners: Female  Other Topics Concern   Not on file  Social History Narrative   Lives with wife   Right Handed   Drink 6-7 cups caffeine daily   Social Drivers of Health   Financial Resource Strain: High Risk (08/01/2023)   Overall Financial Resource Strain (CARDIA)    Difficulty of Paying Living Expenses: Very hard  Food Insecurity: Food Insecurity Present (08/01/2023)   Hunger Vital Sign    Worried About Radiation protection practitioner of Food  in the Last Year: Often true    Ran Out of Food in the Last Year: Often true  Transportation Needs: Unmet Transportation Needs (08/01/2023)   PRAPARE - Administrator, Civil Service (Medical): Yes    Lack of Transportation (Non-Medical): Yes  Physical Activity: Insufficiently Active (08/01/2023)   Exercise  Vital Sign    Days of Exercise per Week: 3 days    Minutes of Exercise per Session: 20 min  Stress: Stress Concern Present (08/01/2023)   [employer] of Occupational Health - Occupational Stress Questionnaire    Feeling of Stress : Very much  Social Connections: Socially Integrated (08/01/2023)   Social Connection and Isolation Panel [NHANES]    Frequency of Communication with Friends and Family: More than three times a week    Frequency of Social Gatherings with Friends and Family: More than three times a week    Attends Religious Services: 1 to 4 times per year    Active Member of [employer] or Organizations: No    Attends Engineer, structural: More than 4 times per year    Marital Status: Married  Catering manager Violence: Not on file    Past Medical History, Surgical history, Social history, and Family history were reviewed and updated as appropriate.   Please see review of systems for further details on the patient's review from today.   Objective:   Physical Exam:  There were no vitals taken for this visit.  Physical Exam Constitutional:      General: He is not in acute distress.    Appearance: Normal appearance.  Neurological:     Mental Status: He is alert and oriented to person, place, and time.     Gait: Gait normal.  Psychiatric:        Attention and Perception: Attention and perception normal. He does not perceive auditory or visual hallucinations.        Mood and Affect: Mood and affect normal. Mood is not anxious or depressed. Affect is not labile.        Speech: Speech normal.        Behavior: Behavior normal. Behavior is cooperative.        Thought Content: Thought content normal.        Cognition and Memory: Cognition and memory normal.        Judgment: Judgment normal.     Lab Review:     Component Value Date/Time   NA 138 07/21/2023 0842   NA 141 12/09/2022 1142   K 4.1 07/21/2023 0842   CL 101 07/21/2023 0842   CO2 30 07/21/2023 0842    GLUCOSE 127 (H) 07/21/2023 0842   BUN 24 (H) 07/21/2023 0842   BUN 29 (H) 12/09/2022 1142   CREATININE 1.36 07/21/2023 0842   CREATININE 1.50 (H) 07/07/2023 1051   CALCIUM 9.5 07/21/2023 0842   PROT 6.7 07/21/2023 0842   PROT 5.9 (L) 07/19/2022 1006   ALBUMIN 4.3 07/21/2023 0842   ALBUMIN 4.2 07/19/2022 1006   AST 15 07/21/2023 0842   ALT 16 07/21/2023 0842   ALKPHOS 32 (L) 07/21/2023 0842   BILITOT 0.6 07/21/2023 0842   BILITOT 0.4 07/19/2022 1006   GFRNONAA >60 10/15/2020 1100   GFRAA 91 06/17/2020 1030       Component Value Date/Time   WBC 8.9 07/21/2023 0842   RBC 4.81 07/21/2023 0842   HGB 14.5 07/21/2023 0842   HGB 16.1 07/19/2022 1006   HCT 43.6 07/21/2023 0842   HCT  46.1 07/19/2022 1006   PLT 225.0 07/21/2023 0842   PLT 182 07/19/2022 1006   MCV 90.6 07/21/2023 0842   MCV 85 07/19/2022 1006   MCH 29.8 07/19/2022 1006   MCH 28.9 10/15/2020 1100   MCHC 33.2 07/21/2023 0842   RDW 14.2 07/21/2023 0842   RDW 14.1 07/19/2022 1006   LYMPHSABS 2.0 07/21/2023 0842   LYMPHSABS 1.5 07/19/2022 1006   MONOABS 0.8 07/21/2023 0842   EOSABS 0.2 07/21/2023 0842   EOSABS 0.2 07/19/2022 1006   BASOSABS 0.1 07/21/2023 0842   BASOSABS 0.0 07/19/2022 1006    No results found for: "POCLITH", "LITHIUM"   No results found for: "PHENYTOIN", "PHENOBARB", "VALPROATE", "CBMZ"   .res Assessment: Plan:    Greater than 50% of 30  min video visit with patient was spent on counseling and coordination of care. Discussed his decline with anxiety and depression this week which was trigger by death of his pet dog. We talked about the need for him to reduce intake of daily news which is increasing his anxiety levels.  Recommended he get outdoors during the nice weather this week and go for walks, get natural sunlight.  He acknowledges being mostly indoors over the winter and his wife is not driving him as much. He is not requesting medication change or adjustment. He is feeling well enough to  request no changes to his medication regimen. He will continue to see Maggie Font  for therapy.    Pt will continue:   To continue Wellbutrin 150 mg XL daily.  Cymbalta 60 twice daily mg daily To continue Prozac 40 mg twice daily  Continue Klonopin take 1/2 to 1 tablet twice daily for severe anxiety.  Will report worsening symptoms or side effects promptly Will follow-up in 6 months to reassess Provided emergency contact information Discussed potential benefits, risk, and side effects of benzodiazepines to include potential risk of tolerance and dependence, as well as possible drowsiness.  Advised patient not to drive if experiencing drowsiness and to take lowest possible effective dose to minimize risk of dependence and tolerance.  Reviewed PDMP          Diagnoses and all orders for this visit:  Generalized anxiety disorder -     clonazePAM (KLONOPIN) 0.5 MG tablet; Take 1 tablet (0.5 mg total) by mouth 2 (two) times daily.  Moderate episode of recurrent major depressive disorder (HCC) -     buPROPion (WELLBUTRIN XL) 150 MG 24 hr tablet; Take 1 tablet (150 mg total) by mouth daily.     Please see After Visit Summary for patient specific instructions.  Future Appointments  Date Time Provider Department Center  08/30/2023  9:20 AM Hannah Beat, MD [facility]  09/04/2023 10:45 AM Helane Gunther, DPM [facility]  09/08/2023 11:30 AM Ihor Austin, NP [facility] None  09/11/2023  8:25 AM [facility] DEVICE REMOTES [facility]  10/16/2023  8:25 AM [facility] DEVICE REMOTES [facility]  11/20/2023  8:25 AM [facility] DEVICE REMOTES [facility]  12/25/2023  8:25 AM [facility] DEVICE REMOTES [facility]  01/05/2024 11:50 AM Shamleffer, Konrad Dolores, MD [facility] None  01/30/2024  7:10 AM [facility] DEVICE REMOTES [facility]  03/04/2024  8:25 AM [facility] DEVICE REMOTES [facility]  04/08/2024   8:25 AM [facility] DEVICE REMOTES [facility]  05/13/2024  8:25 AM [facility] DEVICE REMOTES [facility]  06/17/2024  8:25 AM [facility] DEVICE REMOTES [facility]    No orders of the defined types were placed in this encounter.     -------------------------------

## 2023-08-09 NOTE — Addendum Note (Signed)
 Addended by: Geralyn Flash D on: 08/09/2023 04:09 PM   Modules accepted: Orders

## 2023-08-09 NOTE — Progress Notes (Signed)
 [facility] Summary Report / Loop Recorder

## 2023-08-17 ENCOUNTER — Telehealth: Payer: Self-pay

## 2023-08-17 ENCOUNTER — Ambulatory Visit (INDEPENDENT_AMBULATORY_CARE_PROVIDER_SITE_OTHER)

## 2023-08-17 ENCOUNTER — Ambulatory Visit: Admitting: Podiatry

## 2023-08-17 ENCOUNTER — Encounter: Payer: Self-pay | Admitting: Podiatry

## 2023-08-17 ENCOUNTER — Other Ambulatory Visit (HOSPITAL_COMMUNITY): Payer: Self-pay

## 2023-08-17 DIAGNOSIS — M2042 Other hammer toe(s) (acquired), left foot: Secondary | ICD-10-CM | POA: Diagnosis not present

## 2023-08-17 DIAGNOSIS — L97512 Non-pressure chronic ulcer of other part of right foot with fat layer exposed: Secondary | ICD-10-CM

## 2023-08-17 DIAGNOSIS — L97511 Non-pressure chronic ulcer of other part of right foot limited to breakdown of skin: Secondary | ICD-10-CM

## 2023-08-17 DIAGNOSIS — M2041 Other hammer toe(s) (acquired), right foot: Secondary | ICD-10-CM

## 2023-08-17 MED ORDER — OXYCODONE-ACETAMINOPHEN 5-325 MG PO TABS: 1.0000 | ORAL_TABLET | Freq: Four times a day (QID) | ORAL | 0 refills | Status: DC | PRN

## 2023-08-17 NOTE — Progress Notes (Signed)
 Chief Complaint  Patient presents with   Callouses    Rm 1: Right middle toe callous pt has pain all the time. Has been on doxy x5 days. Has been cleaning with peroxide and wrapping with A&D ointment.    HPI: 63 y.o. male presents today for treatment of right third toe distal callus and treatment of hammertoe.  He has been seeing Dr. Stacie Acres for diabetic footcare.  Patient is accompanied by his wife.  They deny signs of active infection but states that he has been on doxycycline.  They have also been treating the wound with peroxide and attending to offload it with crest pad.  Denies signs of active infection.  Past Medical History:  Diagnosis Date   Angio-edema    Anxiety    Depression    Diabetes mellitus type II    type 2   GERD (gastroesophageal reflux disease)    Headache    migraine   History of kidney stones    HLD (hyperlipidemia)    HTN (hypertension)    Morbid obesity (HCC)    Paroxysmal atrial fibrillation (HCC)    Restless legs syndrome (RLS) 11/20/2012   Stroke (HCC) 02/14/2020   ischemic/right sided affected   Tobacco abuse    Ulcer of left ankle [facility])     Past Surgical History:  Procedure Laterality Date   Arm surgery Right 1969   Abscess excision and debridement at elbow   COLONOSCOPY WITH PROPOFOL N/A 09/06/2016   Procedure: COLONOSCOPY WITH PROPOFOL;  Surgeon: Sherrilyn Rist, MD;  Location: [facility];  Service: Gastroenterology;  Laterality: N/A;   I & D EXTREMITY Left 05/29/2014   Procedure: IRRIGATION AND DEBRIDEMENT OF LEFT LEG WOUND AND PLACEMENT OF  INTEGRA  AND  VAC;  Surgeon: Wayland Denis, DO;  Location: [facility];  Service: Plastics;  Laterality: Left;   implantable loop recorder implant  03/06/2020   Medtronic Reveal [place] model LNQ11 (SN  [id number]) implanted by Dr Johney Frame for cryptogenic stroke   INCISION AND DRAINAGE OF WOUND Left 04/23/2014   Procedure: IRRIGATION AND DEBRIDEMENT LOWER LEFT LEG WOUND WITH PLACEMENT  OF INTEGRA AND VAC;  Surgeon: Wayland Denis, DO;  Location: [facility];  Service: Plastics;  Laterality: Left;   kidney stone removal  2004   MINOR APPLICATION OF WOUND VAC Left 04/23/2014   Procedure: MINOR APPLICATION OF WOUND VAC;  Surgeon: Wayland Denis, DO;  Location: [facility];  Service: Plastics;  Laterality: Left;   SKIN SPLIT GRAFT Left 06/18/2014   Procedure: SKIN GRAFT SPLIT THICKNESS TO LOWER LEFT LEG WOUND WITH PLACEMENT OF VAC;  Surgeon: Wayland Denis, DO;  Location: [facility];  Service: Plastics;  Laterality: Left;   VASECTOMY  1994    Allergies  Allergen Reactions   Cashew Nut Oil Anaphylaxis    Cashews, 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   Pistachio Nut (Diagnostic) Hives, Shortness Of Breath, Itching, Swelling and Rash   Latex Dermatitis   Tape Other (See Comments)    Tears and bruises the skin   Betadine [Povidone Iodine] Rash and Other (See Comments)    Raised rash 04/23/14   Lidocaine Other (See Comments)    Minimal effect with lidocaine, prefers different anesthetic   Penicillins Rash and Other (See Comments)    Rash all over as a child    ROS    Physical Exam: There were no vitals filed for this visit.  General: The patient is alert  and oriented x3 in no acute distress.  Dermatology: Ulceration present distal tuft of right third toe measuring 0.6 x 0.5 x 0.3 cm predebridement with hyperkeratotic margins.  Predominantly granulation tissue to wound bed with no erythema, no significant drainage. Postdebridement measurements 0.7 x 0.7 x 0.3 cm.    Vascular: Faintly palpable DP and PT pedal pulses bilaterally. Capillary refill within normal limits.  No appreciable edema.  No erythema or calor.  Diminished pedal hair growth.  Neurological: Light touch sensation grossly intact bilateral feet.   Musculoskeletal Exam: Reducible hammertoe contractures of lesser digits right third toe in particular.  Radiographic Exam: Right foot  08/17/2023 Normal osseous mineralization. Joint spaces preserved.  No fractures or osseous irregularities noted.  No obvious osteolysis or cortical erosion at site of ulceration right third toe.  Hammertoe contractures noted  Assessment/Plan of Care: 1. Skin ulcer of third toe of right foot with fat layer exposed (HCC)   2. Hammertoe, bilateral      Meds ordered this encounter  Medications   DISCONTD: oxyCODONE-acetaminophen (PERCOCET/ROXICET) 5-325 MG tablet    Sig: Take 1 tablet by mouth every 6 (six) hours as needed for up to 12 doses for severe pain (pain score 7-10).    Dispense:  12 tablet    Refill:  0   oxyCODONE-acetaminophen (PERCOCET/ROXICET) 5-325 MG tablet    Sig: Take 1 tablet by mouth every 6 (six) hours as needed for up to 12 doses for severe pain (pain score 7-10).    Dispense:  12 tablet    Refill:  0   None  Discussed clinical findings with patient today.  Radiographs discussed with patient  Written consent obtained to perform right third toe flexor tenotomy in an effort to offload the ulceration and promote wound healing.  Discussion of risk, benefits and alternative therapies discussed with patient at length.  The ulceration was first excisionally debrided using a 15 blade and cleansed with wound cleanser.  Postdebridement measurements as described above.  Right third toe was anesthetized with a one-to-one ratio of 3 cc of 1% lidocaine and 0.5% Marcaine plain after alcohol skin prep.  The toe was then prepped with ChloraPrep solution.  Right third toe was tenotomized at the plantar sulcus PIPJ level using an 18-gauge needle.  Excellent release of the flexor tendons was noted.  The toe sat in improved rectus position.  Patient tolerated the procedure well.  Hemostasis was achieved with compression and Dermabond.  Bacitracin and Band-Aid was applied to the ulceration site.  The toe was splinted in rectus position using 4 x 4 gauze and Coban.  Percocet prescribed for pain.   Patient may weight-bear as tolerated in surgical shoe which was dispensed to patient.  Change wound dressing to the ulcer site daily using mupirocin ointment and Band-Aid and discontinue the use of any peroxide.  Keep flexor tenotomy dressing otherwise intact.  Follow-up in 1 week for postop check.  Continue doxycycline as prescribed for prophylaxis.   Elya Diloreto L. Marchia Bond, AACFAS [facility]     [address] [place], [place] [address]                Office [phone]  Fax [phone]

## 2023-08-17 NOTE — Patient Instructions (Signed)
 Change dressing to toe ulcer daily with bactroban and bandaid  Keep flexor release dressing on until f/up  May weight bear as tolerated in surgical shoe

## 2023-08-17 NOTE — Telephone Encounter (Signed)
 Please send pain medication (oxy) to [facility] - looks like it printed instead of going to a pharmacy -thanks

## 2023-08-18 ENCOUNTER — Other Ambulatory Visit (HOSPITAL_COMMUNITY): Payer: Self-pay

## 2023-08-18 ENCOUNTER — Other Ambulatory Visit: Payer: Self-pay

## 2023-08-18 MED ORDER — OXYCODONE-ACETAMINOPHEN 5-325 MG PO TABS
1.0000 | ORAL_TABLET | Freq: Four times a day (QID) | ORAL | 0 refills | Status: DC | PRN
  Filled 2023-08-18: qty 12, 3d supply, fill #0

## 2023-08-23 ENCOUNTER — Other Ambulatory Visit (HOSPITAL_COMMUNITY): Payer: Self-pay

## 2023-08-23 MED ORDER — DULOXETINE HCL 60 MG PO CPEP
60.0000 mg | ORAL_CAPSULE | Freq: Two times a day (BID) | ORAL | 0 refills | Status: DC
  Filled 2023-08-23: qty 180, 90d supply, fill #0

## 2023-08-24 ENCOUNTER — Ambulatory Visit (INDEPENDENT_AMBULATORY_CARE_PROVIDER_SITE_OTHER): Admitting: Podiatry

## 2023-08-24 ENCOUNTER — Other Ambulatory Visit: Payer: Self-pay | Admitting: Family Medicine

## 2023-08-24 ENCOUNTER — Encounter: Payer: Self-pay | Admitting: Podiatry

## 2023-08-24 ENCOUNTER — Other Ambulatory Visit: Payer: Self-pay

## 2023-08-24 ENCOUNTER — Other Ambulatory Visit: Payer: Self-pay | Admitting: Podiatry

## 2023-08-24 ENCOUNTER — Other Ambulatory Visit (HOSPITAL_COMMUNITY): Payer: Self-pay

## 2023-08-24 DIAGNOSIS — M2041 Other hammer toe(s) (acquired), right foot: Secondary | ICD-10-CM

## 2023-08-24 DIAGNOSIS — L97512 Non-pressure chronic ulcer of other part of right foot with fat layer exposed: Secondary | ICD-10-CM

## 2023-08-24 DIAGNOSIS — M2042 Other hammer toe(s) (acquired), left foot: Secondary | ICD-10-CM

## 2023-08-24 MED ORDER — PREGABALIN 300 MG PO CAPS
300.0000 mg | ORAL_CAPSULE | Freq: Two times a day (BID) | ORAL | 1 refills | Status: DC
  Filled 2023-08-24: qty 180, 90d supply, fill #0
  Filled 2023-11-13 – 2023-11-27 (×2): qty 180, 90d supply, fill #1

## 2023-08-24 MED ORDER — DOXYCYCLINE HYCLATE 100 MG PO TABS
100.0000 mg | ORAL_TABLET | Freq: Two times a day (BID) | ORAL | 0 refills | Status: DC
Start: 2023-08-24 — End: 2023-08-31
  Filled 2023-08-24: qty 28, 14d supply, fill #0

## 2023-08-24 MED ORDER — EPINEPHRINE 0.3 MG/0.3ML IJ SOAJ: 0.3000 mg | INTRAMUSCULAR | 0 refills | Status: AC | PRN

## 2023-08-24 NOTE — Telephone Encounter (Signed)
 Last office visit 04/17/2023 for medical management of chronic issues.  Last refilled 05/08/2023 for #180 with 1 refill but has changed pharmacies.  Next appt: CPE 08/30/2023.

## 2023-08-25 ENCOUNTER — Other Ambulatory Visit: Payer: Self-pay

## 2023-08-27 NOTE — Progress Notes (Unsigned)
 [name] Vrooman T. Coty Student, MD, CAQ Sports Medicine [facility] at [facility] [address] [place], [address]  Phone: [phone]  FAX: [phone]  [name] Meckes. - 63 y.o. male  MRN [id number]  Date of Birth: 1961-02-13  Date: 08/30/2023  PCP: Hannah Beat, MD  Referral: Hannah Beat, MD  No chief complaint on file.  Patient Care Team: Hannah Beat, MD as PCP - General End, Cristal Deer, MD as PCP - Cardiology (Cardiology) Cherlyn Roberts, MD as Consulting Physician (Dermatology) Luretha Murphy, MD as Consulting Physician (General Surgery) Subjective:   [name] Scarfo. is a 63 y.o. pleasant patient who presents for a medicare wellness examination:  Preventative Health Maintenance Visit:  Health Maintenance Summary Reviewed and updated, unless pt declines services.  Tobacco History Reviewed. Alcohol: No concerns, no excessive use Exercise Habits: Some activity, rec at least 30 mins 5 times a week STD concerns: no risk or activity to increase risk Drug Use: None  [name] is a well-known patient, and he presents with Medicare wellness exam, and he has many different chronic medical problems we will discuss, as well.  History of stroke with residual deficits.  Morbid obesity, but he has lost greater than 100 pounds.  He also has severe diabetic neuropathy, and he has done at least reasonably better with some Lyrica and Cymbalta.  He has intermittently had severe depression.  He also has some anxiety.  He also has paroxysmal A-fib.  History of smoking.  Carotid artery disease.  History of panic attacks.  Medicare wellness exam Prevnar 20 COVID booster Eye exam  He is seeing endocrinology regarding his diabetes  Health Maintenance  Topic Date Due   Medicare Annual Wellness (AWV)  Never done   Pneumococcal Vaccine 32-36 Years old (2 of 2 - PCV) 04/20/2017   COVID-19 Vaccine (4 - 2024-25 season) 01/29/2023    OPHTHALMOLOGY EXAM  07/27/2023   HEMOGLOBIN A1C  01/18/2024   FOOT EXAM  07/06/2024   Diabetic kidney evaluation - eGFR measurement  07/20/2024   Diabetic kidney evaluation - Urine ACR  07/20/2024   DTaP/Tdap/Td (2 - Td or Tdap) 04/20/2026   Colonoscopy  09/07/2026   INFLUENZA VACCINE  Completed   Hepatitis C Screening  Completed   HIV Screening  Completed   Zoster Vaccines- Shingrix  Completed   HPV VACCINES  Aged Out    Immunization History  Administered Date(s) Administered   Influenza, Seasonal, Injecte, Preservative Fre 04/17/2023   Influenza,inj,Quad PF,6+ Mos 04/20/2016, 03/23/2017, 03/19/2018, 02/06/2019, 02/20/2020, 01/19/2022   Influenza-Unspecified 03/31/2015, 04/01/2021   Janssen (J&J) SARS-COV-2 Vaccination 09/27/2019, 04/17/2020   Moderna Covid-19 Vaccine Bivalent Booster 69yrs & up 04/01/2021   Pneumococcal Polysaccharide-23 11/25/2010, 04/20/2016   Tdap 04/20/2016   Zoster Recombinant(Shingrix) 04/06/2022, 07/25/2022    Patient Active Problem List   Diagnosis Date Noted   Ischemic stroke (HCC), occipital     Priority: High   Type 2 diabetes mellitus with vascular disease (HCC)     Priority: High   Morbid obesity (HCC) 09/29/2006    Priority: High   PAF (paroxysmal atrial fibrillation) (HCC) 08/31/2020    Priority: Medium    MDD (major depressive disorder), recurrent episode (HCC)     Priority: Medium    Diabetic polyneuropathy associated with type 2 diabetes mellitus (HCC) 06/30/2018    Priority: Medium    Circulation disorder of lower extremity 05/31/2015    Priority: Medium    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)  04/23/2014    Priority: Medium    Generalized anxiety disorder     Priority: Medium    Hyperlipidemia associated with type 2 diabetes mellitus (HCC) 01/16/2009    Priority: Medium    TOBACCO ABUSE 07/27/2007    Priority: Medium    Essential hypertension 09/29/2006    Priority: Medium    Dyslipidemia 07/10/2023   Skin  ulcer of third toe (HCC) 07/05/2023   Local infection of skin and subcutaneous tissue 05/22/2023   Claudication in peripheral vascular disease (HCC) 05/07/2022   Panic attacks 06/23/2020   Aortic calcification (HCC) 02/22/2020   Carotid artery calcification, bilateral 02/22/2020   GERD (gastroesophageal reflux disease)    Erectile dysfunction due to type 2 diabetes mellitus (HCC) 06/30/2018   IBS (irritable bowel syndrome) 12/24/2014   Restless legs syndrome (RLS) 11/20/2012   CALCULUS, KIDNEY 05/30/2002    Past Medical History:  Diagnosis Date   Angio-edema    Anxiety    Depression    Diabetes mellitus type II    type 2   GERD (gastroesophageal reflux disease)    Headache    migraine   History of kidney stones    HLD (hyperlipidemia)    HTN (hypertension)    Morbid obesity (HCC)    Paroxysmal atrial fibrillation (HCC)    Restless legs syndrome (RLS) 11/20/2012   Stroke (HCC) 02/14/2020   ischemic/right sided affected   Tobacco abuse    Ulcer of left ankle [facility])     Past Surgical History:  Procedure Laterality Date   Arm surgery Right 1969   Abscess excision and debridement at elbow   COLONOSCOPY WITH PROPOFOL N/A 09/06/2016   Procedure: COLONOSCOPY WITH PROPOFOL;  Surgeon: Sherrilyn Rist, MD;  Location: [facility];  Service: Gastroenterology;  Laterality: N/A;   I & D EXTREMITY Left 05/29/2014   Procedure: IRRIGATION AND DEBRIDEMENT OF LEFT LEG WOUND AND PLACEMENT OF  INTEGRA  AND  VAC;  Surgeon: Wayland Denis, DO;  Location: [facility];  Service: Plastics;  Laterality: Left;   implantable loop recorder implant  03/06/2020   Medtronic Reveal [place] model LNQ11 (SN  [id number]) implanted by Dr Johney Frame for cryptogenic stroke   INCISION AND DRAINAGE OF WOUND Left 04/23/2014   Procedure: IRRIGATION AND DEBRIDEMENT LOWER LEFT LEG WOUND WITH PLACEMENT OF INTEGRA AND VAC;  Surgeon: Wayland Denis, DO;  Location: [facility];  Service: Plastics;   Laterality: Left;   kidney stone removal  2004   MINOR APPLICATION OF WOUND VAC Left 04/23/2014   Procedure: MINOR APPLICATION OF WOUND VAC;  Surgeon: Wayland Denis, DO;  Location: [facility];  Service: Plastics;  Laterality: Left;   SKIN SPLIT GRAFT Left 06/18/2014   Procedure: SKIN GRAFT SPLIT THICKNESS TO LOWER LEFT LEG WOUND WITH PLACEMENT OF VAC;  Surgeon: Wayland Denis, DO;  Location: [facility];  Service: Plastics;  Laterality: Left;   VASECTOMY  1994    Family History  Problem Relation Age of Onset   Depression Mother    Anxiety disorder Mother    Stroke Mother        multiple   Leukemia Mother    Pneumonia Mother    Cancer Mother    Diabetes Mother    Diabetes Father    Heart disease Father    Hyperlipidemia Father    Hypertension Father    Allergic rhinitis Neg Hx    Angioedema Neg Hx    Asthma Neg Hx  Eczema Neg Hx    Immunodeficiency Neg Hx    Urticaria Neg Hx     Social History   Social History Narrative   Lives with wife   Right Handed   Drink 6-7 cups caffeine daily    Past Medical History, Surgical History, Social History, Family History, Problem List, Medications, and Allergies have been reviewed and updated if relevant.  Review of Systems: Pertinent positives are listed above.  Otherwise, a full 14 point review of systems has been done in full and it is negative except where it is noted positive.  Objective:   There were no vitals taken for this visit.    02/14/2020   11:41 AM 02/15/2020    3:00 AM 02/15/2020    8:00 AM 07/25/2022    9:32 AM  Fall Risk  Falls in the past year?    1  Was there an injury with Fall?    1  Fall Risk Category Calculator    3  (RETIRED) Patient Fall Risk Level Moderate fall risk Low fall risk Low fall risk   Patient at Risk for Falls Due to    History of fall(s)  Fall risk Follow up    Falls evaluation completed   Ideal Body Weight:   No results found.    04/17/2023   10:22 AM  08/30/2022   11:41 AM 07/25/2022    9:32 AM 01/19/2022    9:59 AM 07/22/2021    9:08 AM  Depression screen PHQ 2/9  Decreased Interest 1  2 2 2   Down, Depressed, Hopeless 1  2 2 3   PHQ - 2 Score 2  4 4 5   Altered sleeping 2  1 2 3   Tired, decreased energy 1  1 2 3   Change in appetite 1  2 1 2   Feeling bad or failure about yourself  1  1 0 2  Trouble concentrating 1  2 3 3   Moving slowly or fidgety/restless 0  0 3 3  Suicidal thoughts 0  1 0 2  PHQ-9 Score 8  12 15 23   Difficult doing work/chores Somewhat difficult  Very difficult Very difficult Extremely dIfficult     Information is confidential and restricted. Go to Review Flowsheets to unlock data.     GEN: well developed, well nourished, no acute distress Eyes: conjunctiva and lids normal, PERRLA, EOMI ENT: TM clear, nares clear, oral exam WNL Neck: supple, no lymphadenopathy, no thyromegaly, no JVD Pulm: clear to auscultation and percussion, respiratory effort normal CV: regular rate and rhythm, S1-S2, no murmur, rub or gallop, no bruits, peripheral pulses normal and symmetric, no cyanosis, clubbing, edema or varicosities GI: soft, non-tender; no hepatosplenomegaly, masses; active bowel sounds all quadrants GU: deferred Lymph: no cervical, axillary or inguinal adenopathy MSK: gait normal, muscle tone and strength WNL, no joint swelling, effusions, discoloration, crepitus  SKIN: clear, good turgor, color WNL, no rashes, lesions, or ulcerations Neuro: normal mental status, normal strength, sensation, and motion Psych: alert; oriented to person, place and time, normally interactive and not anxious or depressed in appearance.  All labs reviewed with patient.  Results for orders placed or performed in visit on 08/07/23  CUP PACEART REMOTE DEVICE CHECK   Collection Time: 08/06/23 11:07 PM  Result Value Ref Range   Date Time Interrogation Session [id number]    Pulse Generator Manufacturer MERM    Pulse Gen Model WGN56 Reveal  LINQ    Pulse Gen Serial Number [id number]    Cl[facility]    Implantable Pulse Generator Type ICM/ILR    Implantable Pulse Generator Implant Date [id number]     Assessment and Plan:   No diagnosis found.   Health Maintenance Exam: The patient's preventative maintenance and recommended screening tests for an annual wellness exam were reviewed in full today. Brought up to date unless services declined.  Counselled on the importance of diet, exercise, and its role in overall health and mortality. The patient's FH and SH was reviewed, including their home life, tobacco status, and drug and alcohol status.  Follow-up in 1 year for physical exam or additional follow-up below.  I have personally reviewed the Medicare Annual Wellness questionnaire and have noted 1. The patient's medical and social history 2. Their use of alcohol, tobacco or illicit drugs 3. Their current medications and supplements 4. The patient's functional ability including ADL's, fall risks, home safety risks and hearing or visual             impairment. 5. Diet and physical activities 6. Evidence for depression or mood disorders 7. Reviewed Updated provider list, see scanned forms and [facility] Snapshot.  8. Reviewed whether or not the patient has HCPOA or living will, and discussed what this means with the patient.  Recommended he bring in a copy for his chart in [facility].  The patients weight, height, BMI and visual acuity have been recorded in the chart I have made referrals, counseling and provided education to the patient based review of the above and I have provided the pt with a written personalized care plan for preventive services.  I have provided the patient with a copy of your personalized plan for preventive services. Instructed to take the time to review along with their updated medication list.  Disposition: No follow-ups on file.  Future Appointments  Date Time Provider Department Center   08/30/2023  9:20 AM Hannah Beat, MD [facility]  08/31/2023 10:30 AM Barbaraann Share, DPM [facility]  09/04/2023 10:45 AM Helane Gunther, DPM [facility]  09/08/2023 11:30 AM Ihor Austin, NP [facility] None  09/11/2023  8:25 AM [facility] DEVICE REMOTES [facility]  10/16/2023  8:25 AM [facility] DEVICE REMOTES [facility]  11/20/2023  8:25 AM [facility] DEVICE REMOTES [facility]  12/25/2023  8:25 AM [facility] DEVICE REMOTES [facility]  01/05/2024 11:50 AM Shamleffer, Konrad Dolores, MD [facility] None  01/30/2024  7:10 AM [facility] DEVICE REMOTES [facility]  02/12/2024 11:00 AM Cottle, Steva Ready., MD [facility] None  03/04/2024  8:25 AM [facility] DEVICE REMOTES [facility]  04/08/2024  8:25 AM [facility] DEVICE REMOTES [facility]  05/13/2024  8:25 AM [facility] DEVICE REMOTES [facility]  06/17/2024  8:25 AM [facility] DEVICE REMOTES [facility]    No orders of the defined types were placed in this encounter.  There are no discontinued medications. No orders of the defined types were placed in this encounter.   Signed,  Elpidio Galea. 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   Pistachio Nut (diagnostic) Hives, Shortness Of Breath, Itching, Swelling, Rash   Latex Dermatitis   Tape Other (See Comments)   Tears and bruises the skin   Betadine [povidone Iodine] Rash, Other (See Comments)   Raised rash 04/23/14   Lidocaine Other (See Comments)   Minimal effect with lidocaine, prefers different anesthetic   Penicillins Rash, Other (See Comments)   Rash all over as  a child        Medication List        Accurate as of August 27, 2023  3:08 PM. If you have any questions, ask your nurse or doctor.          Acetaminophen Extra Strength 500 MG Tabs Take 2 tablets  (1,000 mg total) by mouth 2 (two) times daily as needed (for pain).   buPROPion 150 MG 24 hr tablet Commonly known as: WELLBUTRIN XL Take 1 tablet (150 mg total) by mouth daily.   cetirizine 10 MG tablet Commonly known as: ZYRTEC Take 10 mg by mouth daily.   clonazePAM 0.5 MG tablet Commonly known as: KLONOPIN Take 1 tablet (0.5 mg total) by mouth 2 (two) times daily.   Daily-Vite Multivitamin Tabs Take 1 tablet by mouth daily.   diltiazem 30 MG tablet Commonly known as: CARDIZEM TAKE 1 TABLET BY MOUTH EVERY 4 HOURS AS NEEDED FOR HEART RATE>100.   doxycycline 100 MG tablet Commonly known as: VIBRA-TABS Take 1 tablet (100 mg total) by mouth 2 (two) times daily for 14 days.   DULoxetine 30 MG capsule Commonly known as: CYMBALTA Take 2 capsules (60 mg total) by mouth daily AND 1 capsule (30 mg total) at bedtime.   DULoxetine 60 MG capsule Commonly known as: CYMBALTA Take 1 capsule (60 mg total) by mouth 2 (two) times daily.   DULoxetine 60 MG capsule Commonly known as: CYMBALTA Take 1 capsule (60 mg total) by mouth 2 (two) times daily.   Easy Touch Alcohol Prep Medium 70 % Pads Use to check blood sugar daily   EPINEPHrine 0.3 mg/0.3 mL Soaj injection Commonly known as: EPI-PEN Inject 0.3 mg into the muscle as needed for anaphylaxis.   FLUoxetine 40 MG capsule Commonly known as: PROZAC Take 2 capsules (80 mg total) by mouth daily.   FLUoxetine 40 MG capsule Commonly known as: PROZAC Take 2 capsules (80 mg total) by mouth daily.   hydrochlorothiazide 12.5 MG tablet Commonly known as: HYDRODIURIL Take 1 tablet (12.5 mg total) by mouth daily.   hydrochlorothiazide 25 MG tablet Commonly known as: HYDRODIURIL Take 1 tablet (25 mg total) by mouth daily.   hydrochlorothiazide 25 MG tablet Commonly known as: HYDRODIURIL Take 1 tablet (25 mg total) by mouth daily.   hydrOXYzine 10 MG tablet Commonly known as: ATARAX Take 10 mg by mouth 2 (two) times daily as  needed.   Jardiance 25 MG Tabs tablet Generic drug: empagliflozin Take 1 tablet (25 mg total) by mouth daily before breakfast.   LIBREXIA-AF apixaban or placebo 5 mg Caps capsule Take 1 capsule (5 mg total) by mouth 2 (two) times daily. Take at approximately the same time of day with or without food. Please bring bottle back with you to every visit; do not discard bottle. Please contact [facility] for any questions or concerns regarding this medication. For Investigational Use Only.   LIBREXIA-AF [id number] (Milvexian) or placebo 100 mg tablet Take 1 tablet by mouth 2 (two) times daily. Take at approximately the same time of day with or without food. Please bring bottle back with you to every visit; do not discard bottle. Please contact [facility] for any questions or concerns regarding this medication. For Investigational Use Only.   lisinopril 10 MG tablet Commonly known as: ZESTRIL Take 1 tablet (10 mg total) by mouth daily.   lisinopril 10 MG tablet Commonly known as: ZESTRIL Take 1 tablet (10 mg total) by mouth daily.  lisinopril 10 MG tablet Commonly known as: ZESTRIL Take 1 tablet (10 mg total) by mouth daily.   metFORMIN 500 MG 24 hr tablet Commonly known as: GLUCOPHAGE-XR Take 4 tablets (2,000 mg total) by mouth daily in the afternoon.   metFORMIN 500 MG 24 hr tablet Commonly known as: GLUCOPHAGE-XR Take 4 tablets (2,000 mg total) by mouth daily in the afternoon.   ONE TOUCH ULTRA 2 w/Device Kit Use as directed once daily.   OneTouch Delica Plus [place]ancing Misc Use as directed in the morning, at noon, and at bedtime.   OneTouch Ultra Blue Test test strip Generic drug: glucose blood Use as directed once daily.   oxyCODONE-acetaminophen 5-325 MG tablet Commonly known as: PERCOCET/ROXICET Take 1 tablet by mouth every 6 (six) hours as needed for up to 12 doses for severe pain (pain score 7-10).   pantoprazole 40 MG tablet Commonly  known as: PROTONIX Take 1 tablet (40 mg total) by mouth daily.   pantoprazole 40 MG tablet Commonly known as: PROTONIX Take 1 tablet (40 mg total) by mouth daily.   pentoxifylline 400 MG CR tablet Commonly known as: TRENTAL Take 1 tablet (400 mg total) by mouth 3 (three) times daily with meals.   pregabalin 300 MG capsule Commonly known as: LYRICA TAKE 1 CAPSULE(300 MG) BY MOUTH TWICE DAILY   repaglinide 1 MG tablet Commonly known as: PRANDIN Take 0.5 tablets (0.5 mg total) by mouth 2 (two) times daily before a meal.   rOPINIRole 0.5 MG tablet Commonly known as: REQUIP Take 1 tablet (0.5 mg total) by mouth 2 (two) times daily.   rosuvastatin 20 MG tablet Commonly known as: CRESTOR Take 1 tablet (20 mg total) by mouth at bedtime.   triamcinolone cream 0.1 % Commonly known as: KENALOG Apply 1 Application topically 2 (two) times daily.   TRUEplus Lancets 33G Misc Use to check blood sugar 2 times a day   OneTouch Delica Plus Lancet33G Misc Use as directed in the morning, at noon, and at bedtime.   Trulicity 4.5 MG/0.5ML Soaj Generic drug: Dulaglutide Inject 4.5 mg into the skin once a week.

## 2023-08-27 NOTE — Progress Notes (Signed)
 Subjective: Chief Complaint  Patient presents with   Routine Post Op    RM#11  Post op tenotomy check right foot no signs of infection no redness.   63 year old male presents the office with above concerns.  Underwent flexor tenotomy with Dr. Jamse Arn last week.  His wife states there is still some clear drainage but no pus but the wound is doing much better.  Does not report any fevers or chills.  They both state that he has done very well since the procedure.  Objective: AAO x3, NAD DP/PT pulses palpable bilaterally, CRT less than 3 seconds Procedure site appears to have healed.  There is still small in the distal aspect of the associated toe without any probing to bone, undermining or tunneling.  There are some slight edema but no frank purulence identified today or any significant drainage.  There is no fluctuation or crepitation.  The monitor. No pain with calf compression, swelling, warmth, erythema  Assessment: Status post flexor tenotomy  Plan: -All treatment options discussed with the patient including all alternatives, risks, complications.  -Wound appears to be healing well.  This should be debrided the wound today lightly with a #312 with scalpel in order to remove any nonviable tissue although is quite minimal today.  Recommend continue with daily dressing changes, offloading.  Given the history of still having some drainage a refill doxycycline. -Monitor for any clinical signs or symptoms of infection and directed to call the office immediately should any occur or go to the ER. -Patient encouraged to call the office with any questions, concerns, change in symptoms.   Return in about 1 week (around 08/31/2023) for wound check with Dr. Jamse Arn.  Repeat x-ray next appointment  Vivi Barrack DPM

## 2023-08-28 ENCOUNTER — Other Ambulatory Visit: Payer: Self-pay | Admitting: Family Medicine

## 2023-08-28 ENCOUNTER — Other Ambulatory Visit: Payer: Self-pay

## 2023-08-28 MED ORDER — BLOOD GLUCOSE TEST VI STRP
1.0000 | ORAL_STRIP | Freq: Every day | 3 refills | Status: DC
  Filled 2023-08-28: qty 100, 100d supply, fill #0
  Filled 2023-12-05: qty 100, 100d supply, fill #1
  Filled 2024-03-14: qty 100, 100d supply, fill #2
  Filled 2024-06-17 – 2024-06-26 (×2): qty 100, 100d supply, fill #3

## 2023-08-30 ENCOUNTER — Encounter: Payer: Self-pay | Admitting: Family Medicine

## 2023-08-30 ENCOUNTER — Ambulatory Visit: Admitting: Family Medicine

## 2023-08-30 VITALS — BP 100/62 | HR 87 | Temp 97.4°F | Ht 69.5 in | Wt 292.2 lb

## 2023-08-30 DIAGNOSIS — Z0000 Encounter for general adult medical examination without abnormal findings: Secondary | ICD-10-CM | POA: Diagnosis not present

## 2023-08-30 DIAGNOSIS — Z23 Encounter for immunization: Secondary | ICD-10-CM | POA: Diagnosis not present

## 2023-08-31 ENCOUNTER — Other Ambulatory Visit: Payer: Self-pay | Admitting: Podiatry

## 2023-08-31 ENCOUNTER — Other Ambulatory Visit: Payer: Self-pay

## 2023-08-31 ENCOUNTER — Other Ambulatory Visit (HOSPITAL_COMMUNITY): Payer: Self-pay

## 2023-08-31 ENCOUNTER — Encounter: Payer: Self-pay | Admitting: Podiatry

## 2023-08-31 ENCOUNTER — Ambulatory Visit (INDEPENDENT_AMBULATORY_CARE_PROVIDER_SITE_OTHER)

## 2023-08-31 ENCOUNTER — Ambulatory Visit: Admitting: Podiatry

## 2023-08-31 DIAGNOSIS — L97512 Non-pressure chronic ulcer of other part of right foot with fat layer exposed: Secondary | ICD-10-CM | POA: Diagnosis not present

## 2023-08-31 DIAGNOSIS — E1159 Type 2 diabetes mellitus with other circulatory complications: Secondary | ICD-10-CM

## 2023-08-31 DIAGNOSIS — M869 Osteomyelitis, unspecified: Secondary | ICD-10-CM

## 2023-08-31 MED ORDER — MUPIROCIN 2 % EX OINT
1.0000 | TOPICAL_OINTMENT | Freq: Every day | CUTANEOUS | 0 refills | Status: DC
  Filled 2023-08-31: qty 22, 22d supply, fill #0

## 2023-08-31 MED ORDER — DOXYCYCLINE HYCLATE 100 MG PO TABS
100.0000 mg | ORAL_TABLET | Freq: Two times a day (BID) | ORAL | 0 refills | Status: DC
  Filled 2023-08-31 – 2023-09-02 (×2): qty 28, 14d supply, fill #0

## 2023-08-31 NOTE — Progress Notes (Signed)
 Chief Complaint  Patient presents with   skin ulcer     " I have some pain in the middle of the foot from the 3rd toe,and my neuropathy give me numbness"   Date of procedure: 08/17/2023 Procedure: Right third toe flexor tenotomy  HPI: 63 y.o. male presents today status post right third toe flexor tenotomy and for treatment of ulceration to distal third toe.  He is accompanied by wife, they have been keeping up with wound care.  He has been on doxycycline.  They report minimal serous drainage.  Patient does report some soreness going through the toe.  They deny any pus.  They feel that the wound is doing better.  Past Medical History:  Diagnosis Date   Angio-edema    Anxiety    Depression    Diabetes mellitus type II    type 2   GERD (gastroesophageal reflux disease)    Headache    migraine   History of kidney stones    HLD (hyperlipidemia)    HTN (hypertension)    Morbid obesity (HCC)    Paroxysmal atrial fibrillation (HCC)    Restless legs syndrome (RLS) 11/20/2012   Stroke (HCC) 02/14/2020   ischemic/right sided affected   Tobacco abuse    Ulcer of left ankle [facility])     Past Surgical History:  Procedure Laterality Date   Arm surgery Right 1969   Abscess excision and debridement at elbow   COLONOSCOPY WITH PROPOFOL N/A 09/06/2016   Procedure: COLONOSCOPY WITH PROPOFOL;  Surgeon: Sherrilyn Rist, MD;  Location: [facility];  Service: Gastroenterology;  Laterality: N/A;   I & D EXTREMITY Left 05/29/2014   Procedure: IRRIGATION AND DEBRIDEMENT OF LEFT LEG WOUND AND PLACEMENT OF  INTEGRA  AND  VAC;  Surgeon: Wayland Denis, DO;  Location: [facility];  Service: Plastics;  Laterality: Left;   implantable loop recorder implant  03/06/2020   Medtronic Reveal [place] model LNQ11 (SN  [id number]) implanted by Dr Johney Frame for cryptogenic stroke   INCISION AND DRAINAGE OF WOUND Left 04/23/2014   Procedure: IRRIGATION AND DEBRIDEMENT LOWER LEFT LEG WOUND WITH  PLACEMENT OF INTEGRA AND VAC;  Surgeon: Wayland Denis, DO;  Location: [facility];  Service: Plastics;  Laterality: Left;   kidney stone removal  2004   MINOR APPLICATION OF WOUND VAC Left 04/23/2014   Procedure: MINOR APPLICATION OF WOUND VAC;  Surgeon: Wayland Denis, DO;  Location: [facility];  Service: Plastics;  Laterality: Left;   SKIN SPLIT GRAFT Left 06/18/2014   Procedure: SKIN GRAFT SPLIT THICKNESS TO LOWER LEFT LEG WOUND WITH PLACEMENT OF VAC;  Surgeon: Wayland Denis, DO;  Location: [facility];  Service: Plastics;  Laterality: Left;   VASECTOMY  1994    Allergies  Allergen Reactions   Cashew Nut Oil Anaphylaxis    Cashews, 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   Pistachio Nut (Diagnostic) Hives, Shortness Of Breath, Itching, Swelling and Rash   Latex Dermatitis   Tape Other (See Comments)    Tears and bruises the skin   Betadine [Povidone Iodine] Rash and Other (See Comments)    Raised rash 04/23/14   Lidocaine Other (See Comments)    Minimal effect with lidocaine, prefers different anesthetic   Penicillins Rash and Other (See Comments)    Rash all over as a child    ROS denies any nausea, vomiting, fever, chills, chest pain, shortness of breath   Physical Exam: There were  no vitals filed for this visit.  General: The patient is alert and oriented x3 in no acute distress.  Dermatology: Hyperkeratotic callus overlying the distal aspect of the third toe with focal area of ulceration.  Following debridement this measures about 0.3 x 0.4 x 0.3 cm.  No deep probe to bone, no undermining, no purulence, no fluctuance, no crepitus.  No significant drainage noted on today's exam.  Vascular: Faintly palpable pedal pulses bilaterally. Capillary refill within normal limits.  No diffuse edema.  Mild edema to the right third toe.  No erythema.  No warmth increased.  Diminished pedal hair growth.  Neurological: Protective sensation  decreased  Musculoskeletal Exam: Improved alignment of right third toe, decrease of flexor contracture.  Charcot foot deformity noted.  Radiographic Exam: Right foot 3 views weightbearing 08/31/2023 Joint spaces preserved.  On close review of imaging from February, there are signs of very mild osteolysis and cortical irregularities at the distal third toe.  This does appear relatively stable from films from 3/20.  Assessment/Plan of Care: 1. Skin ulcer of third toe of right foot with fat layer exposed (HCC)   2. 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    Dispense:  28 tablet    Refill:  0   mupirocin ointment (BACTROBAN) 2 %    Sig: Apply 1 Application topically daily for 22 days.    Dispense:  22 g    Refill:  0   VAS [place] ABI WITH/WO TBI  Discussed clinical findings with patient today.  Radiographs discussed with patient at length.  Did discuss that there are findings concerning for osteomyelitis and developing bone infection with the patient and wife.  They understand that this poses risk of amputation.  They wish to try and avoid surgery.  Discussed that it was reasonable to try continued antibiotics and local wound care as the ulceration has improved significantly since the flexor tenotomy. This will require continued oral antibiotics and close follow-up with local wound care.  Extending course of oral doxycycline prescribing mupirocin ointment to continue home dressing changes.  Excisional debridement of the ulceration performed today using 312 scalpel blade of nonviable skin and subcutaneous tissue down to patient tolerance.  Postdebridement measurement/described above.  Mupirocin and bandage applied today.  Instructed patient to monitor closely for any signs of infection including increased drainage, worsening  redness, swelling, worsening pain or developing systemic signs of infection such as nausea, vomiting, fever, chills that he would need to call our office immediately or seek medical attention and we would need to proceed with surgical intervention.  They expressed good understanding.  ABIs ordered today to establish baseline circulatory status.  Follow-up in 2 weeks.   Betzabe Bevans L. Marchia Bond, AACFAS [facility]     [address] [place], [place] [address]                Office [phone]  Fax [phone]

## 2023-09-01 ENCOUNTER — Encounter: Payer: Self-pay | Admitting: Family Medicine

## 2023-09-01 ENCOUNTER — Other Ambulatory Visit: Payer: Self-pay | Admitting: Behavioral Health

## 2023-09-01 ENCOUNTER — Other Ambulatory Visit (HOSPITAL_COMMUNITY): Payer: Self-pay

## 2023-09-01 DIAGNOSIS — F411 Generalized anxiety disorder: Secondary | ICD-10-CM

## 2023-09-01 MED ORDER — COVID-19 MRNA VAC-TRIS(PFIZER) 30 MCG/0.3ML IM SUSY
PREFILLED_SYRINGE | INTRAMUSCULAR | 0 refills | Status: DC
  Filled 2023-09-01: qty 0.3, 30d supply, fill #0
  Filled 2023-09-01: qty 0.3, 1d supply, fill #0

## 2023-09-02 ENCOUNTER — Other Ambulatory Visit (HOSPITAL_COMMUNITY): Payer: Self-pay

## 2023-09-02 ENCOUNTER — Other Ambulatory Visit: Payer: Self-pay | Admitting: Family Medicine

## 2023-09-04 ENCOUNTER — Ambulatory Visit: Payer: HMO | Admitting: Podiatry

## 2023-09-04 ENCOUNTER — Ambulatory Visit: Admitting: Cardiology

## 2023-09-04 ENCOUNTER — Other Ambulatory Visit: Payer: Self-pay

## 2023-09-04 ENCOUNTER — Other Ambulatory Visit (HOSPITAL_COMMUNITY): Payer: Self-pay

## 2023-09-04 ENCOUNTER — Other Ambulatory Visit: Payer: Self-pay | Admitting: Family Medicine

## 2023-09-04 ENCOUNTER — Telehealth: Payer: Self-pay | Admitting: Neurology

## 2023-09-04 MED ORDER — PANTOPRAZOLE SODIUM 40 MG PO TBEC
40.0000 mg | DELAYED_RELEASE_TABLET | Freq: Every day | ORAL | 3 refills | Status: AC
  Filled 2023-09-04: qty 90, 90d supply, fill #0
  Filled 2023-12-04: qty 90, 90d supply, fill #1
  Filled 2024-03-02: qty 90, 90d supply, fill #2
  Filled 2024-05-26: qty 90, 90d supply, fill #3

## 2023-09-04 MED ORDER — PENTOXIFYLLINE ER 400 MG PO TBCR
400.0000 mg | EXTENDED_RELEASE_TABLET | Freq: Three times a day (TID) | ORAL | 3 refills | Status: AC
  Filled 2023-09-04: qty 270, 90d supply, fill #0
  Filled 2023-12-05: qty 270, 90d supply, fill #1
  Filled 2024-03-12: qty 270, 90d supply, fill #2
  Filled 2024-06-19: qty 270, 90d supply, fill #3

## 2023-09-04 MED ORDER — ROSUVASTATIN CALCIUM 20 MG PO TABS
20.0000 mg | ORAL_TABLET | Freq: Every day | ORAL | 3 refills | Status: AC
  Filled 2023-09-04: qty 90, 90d supply, fill #0
  Filled 2023-12-04: qty 90, 90d supply, fill #1
  Filled 2024-03-02: qty 90, 90d supply, fill #2
  Filled 2024-05-26: qty 90, 90d supply, fill #3

## 2023-09-04 NOTE — Telephone Encounter (Signed)
 Pt's wife called need to reschedule appointment due to family funeral

## 2023-09-04 NOTE — Progress Notes (Deleted)
 Cardiology Office Note:  .   Date:  09/04/2023  ID:  [name] Evens., DOB [birth year], MRN [id number] PCP: [name] Beat, MD  [facility] Providers Cardiologist:  [name] Kendall, MD { Click to update primary MD,subspecialty MD or APP then REFRESH:1}   History of Present Illness: .   [name] Ashurst. is a 63 y.o. male with past medical history of paroxysmal atrial fibrillation, peripheral artery disease, hypertension, hyperlipidemia, type 2 diabetes, and a stroke (01/2020), who is here today to follow-up on his paroxysmal atrial fibrillation.   Previously had undergone myocardial PET/CT and ABIs for further evaluation of chest and leg pains.  Stress test was low risk without evidence of ischemia or scar.  LVEF is mildly reduced at rest but normalized with stress.  Myocardial blood flow reserve was normal.  Lower extremity vascular studies did not show any significant narrowing in either leg.  Subsequent echo was normal other than mild diastolic dysfunction.   Last seen in clinic 02/07/23 from the cardiac perspective he been doing fairly well.  Blood pressure been well-controlled continue to monitor his pressures at home.  Unfortunately had been unable to get [place], [place], or [place] due to cost constraints.  Denies any hospitalizations or visits to the emergency department.  No medication changes were made and no further testing that was ordered at that time.  He returns clinic today  ROS: 10 point review of system has been reviewed and considered negative with exception ones are listed in the HPI  Studies Reviewed: Marland Kitchen        TTE (09/20/2022): Normal LV size and wall thickness.  LVEF 55-60% with grade 1 diastolic dysfunction.  Normal RV size and function.  Normal biatrial size.  No pericardial effusion.  No significant valvular abnormalities.  Normal CVP.   ABIs and arterial Dopplers (08/12/2022): Bilateral lower extremity arteries widely patent with no significant plaque.   ABIs 1.28 on the right and 1.24 on the left.  TBI 0.47 on the right and 0.59 on the left.   Myocardial PET/CT (08/09/2022): Normal, low risk study without evidence of ischemia or scar.  Mildly reduced LVEF at rest, which normalizes with stress.  Normal myocardial blood flow reserve.  Coronary artery calcifications present. Risk Assessment/Calculations:    CHA2DS2-VASc Score = 5  {Confirm score is correct.  If not, click here to update score.  REFRESH note.  :1} This indicates a 7.2% annual risk of stroke. The patient's score is based upon: CHF History: 0 HTN History: 1 Diabetes History: 1 Stroke History: 2 Vascular Disease History: 1 Age Score: 0 Gender Score: 0   {This patient has a significant risk of stroke if diagnosed with atrial fibrillation.  Please consider VKA or DOAC agent for anticoagulation if the bleeding risk is acceptable.   You can also use the SmartPhrase .[email or web address] for documentation.   :[id number]} No BP recorded.  {Refresh Note OR Click here to enter BP  :1}***       Physical Exam:   VS:  There were no vitals taken for this visit.   Wt Readings from Last 3 Encounters:  08/30/23 292 lb 4 oz (132.6 kg)  07/07/23 300 lb (136.1 kg)  05/22/23 (!) 308 lb 2.1 oz (139.8 kg)    GEN: Well nourished, well developed in no acute distress NECK: No JVD; No carotid bruits CARDIAC: ***RRR, no murmurs, rubs, gallops RESPIRATORY:  Clear to auscultation without rales, wheezing or rhonchi  ABDOMEN: Soft, non-tender, non-distended EXTREMITIES:  No edema; No  deformity   ASSESSMENT AND PLAN: .   Paroxysmal atrial fibrillation Primary hypertension Mixed hyperlipidemia Morbid obesity    {Are you ordering a CV Procedure (e.g. stress test, cath, DCCV, TEE, etc)?   Press F2        :[id number]}  Dispo: ***  Signed, Laureano Hetzer, NP

## 2023-09-05 ENCOUNTER — Other Ambulatory Visit: Payer: Self-pay

## 2023-09-07 ENCOUNTER — Other Ambulatory Visit (HOSPITAL_COMMUNITY): Payer: Self-pay

## 2023-09-07 ENCOUNTER — Other Ambulatory Visit: Payer: Self-pay | Admitting: Family Medicine

## 2023-09-07 ENCOUNTER — Other Ambulatory Visit: Payer: Self-pay

## 2023-09-08 ENCOUNTER — Other Ambulatory Visit (HOSPITAL_COMMUNITY): Payer: Self-pay

## 2023-09-08 ENCOUNTER — Encounter: Payer: Self-pay | Admitting: Pharmacist

## 2023-09-08 ENCOUNTER — Other Ambulatory Visit: Payer: Self-pay | Admitting: Podiatry

## 2023-09-08 ENCOUNTER — Encounter: Payer: Self-pay | Admitting: Podiatry

## 2023-09-08 ENCOUNTER — Ambulatory Visit: Admitting: Adult Health

## 2023-09-08 ENCOUNTER — Other Ambulatory Visit: Payer: Self-pay

## 2023-09-08 DIAGNOSIS — M869 Osteomyelitis, unspecified: Secondary | ICD-10-CM

## 2023-09-08 MED ORDER — DOXYCYCLINE HYCLATE 100 MG PO TABS
100.0000 mg | ORAL_TABLET | Freq: Two times a day (BID) | ORAL | 0 refills | Status: DC
  Filled 2023-09-08: qty 14, 7d supply, fill #0

## 2023-09-08 NOTE — Progress Notes (Signed)
 Refill for antibiotics sent in

## 2023-09-12 ENCOUNTER — Other Ambulatory Visit: Payer: Self-pay | Admitting: Podiatry

## 2023-09-12 DIAGNOSIS — M869 Osteomyelitis, unspecified: Secondary | ICD-10-CM

## 2023-09-14 ENCOUNTER — Other Ambulatory Visit: Payer: Self-pay

## 2023-09-14 ENCOUNTER — Other Ambulatory Visit (HOSPITAL_COMMUNITY): Payer: Self-pay

## 2023-09-14 ENCOUNTER — Encounter: Payer: Self-pay | Admitting: Podiatry

## 2023-09-14 ENCOUNTER — Ambulatory Visit (INDEPENDENT_AMBULATORY_CARE_PROVIDER_SITE_OTHER)

## 2023-09-14 ENCOUNTER — Ambulatory Visit: Admitting: Podiatry

## 2023-09-14 DIAGNOSIS — M869 Osteomyelitis, unspecified: Secondary | ICD-10-CM

## 2023-09-14 DIAGNOSIS — E1159 Type 2 diabetes mellitus with other circulatory complications: Secondary | ICD-10-CM

## 2023-09-14 DIAGNOSIS — L97512 Non-pressure chronic ulcer of other part of right foot with fat layer exposed: Secondary | ICD-10-CM | POA: Diagnosis not present

## 2023-09-14 DIAGNOSIS — I739 Peripheral vascular disease, unspecified: Secondary | ICD-10-CM

## 2023-09-14 DIAGNOSIS — S91209A Unspecified open wound of unspecified toe(s) with damage to nail, initial encounter: Secondary | ICD-10-CM

## 2023-09-14 MED ORDER — DOXYCYCLINE HYCLATE 100 MG PO TABS
100.0000 mg | ORAL_TABLET | Freq: Two times a day (BID) | ORAL | 0 refills | Status: DC
  Filled 2023-09-14: qty 42, 21d supply, fill #0

## 2023-09-14 MED ORDER — MUPIROCIN 2 % EX OINT
1.0000 | TOPICAL_OINTMENT | Freq: Two times a day (BID) | CUTANEOUS | 2 refills | Status: DC
  Filled 2023-09-14 – 2023-09-18 (×4): qty 44, 22d supply, fill #0
  Filled 2023-10-03 – 2023-10-05 (×2): qty 44, 22d supply, fill #1

## 2023-09-14 NOTE — Progress Notes (Signed)
 Chief Complaint  Patient presents with   Diabetic Ulcer    "It's healing but it's still sore."    Nail Problem     "I have a place on my left foot that they think my toenail is coming off." N - toenail coming off L - 4th left D - last week O - suddenly, nail snagged on my sock Dr. Zettie Hillock didn't cut properly C - loose, sharp pain, throb, aches A - certain shoes T - band-aid, Bactroban , saline cleaning it, pad it  Date of procedure: 08/17/2023 Procedure: Right third toe flexor tenotomy  HPI: 63 y.o. male presents today status post right third toe flexor tenotomy and for treatment of ulceration to distal third toe.  He is accompanied by daughter, they have been keeping up with wound care.  He has been on doxycycline .  They report minimal serous drainage.  The ulceration is present as a sinus distally at this point.  Patient does report some soreness going through the toe.  Patient does report getting the left fourth toenail caught on his sock, ripping it and now the toenail feels loose like it is coming off.  Past Medical History:  Diagnosis Date   Angio-edema    Anxiety    Claudication in peripheral vascular disease (HCC) 05/07/2022   Depression    Diabetes mellitus type II    Diabetic polyneuropathy associated with type 2 diabetes mellitus (HCC) 06/30/2018   GERD (gastroesophageal reflux disease)    History of kidney stones    HLD (hyperlipidemia)    HTN (hypertension)    Morbid obesity (HCC)    PAF (paroxysmal atrial fibrillation) (HCC) 08/31/2020   Paroxysmal atrial fibrillation (HCC)    Restless legs syndrome (RLS) 11/20/2012   Stroke (HCC) 02/14/2020   ischemic/right sided affected   Tobacco abuse     Past Surgical History:  Procedure Laterality Date   Arm surgery Right 1969   Abscess excision and debridement at elbow   COLONOSCOPY WITH PROPOFOL  N/A 09/06/2016   Procedure: COLONOSCOPY WITH PROPOFOL ;  Surgeon: Albertina Hugger, MD;  Location: [facility];   Service: Gastroenterology;  Laterality: N/A;   I & D EXTREMITY Left 05/29/2014   Procedure: IRRIGATION AND DEBRIDEMENT OF LEFT LEG WOUND AND PLACEMENT OF  INTEGRA  AND  VAC;  Surgeon: Marilou Showman, DO;  Location: [facility];  Service: Plastics;  Laterality: Left;   implantable loop recorder implant  03/06/2020   Medtronic Reveal [place] model LNQ11 (SN  [id number]) implanted by Dr Nunzio Belch for cryptogenic stroke   INCISION AND DRAINAGE OF WOUND Left 04/23/2014   Procedure: IRRIGATION AND DEBRIDEMENT LOWER LEFT LEG WOUND WITH PLACEMENT OF INTEGRA AND VAC;  Surgeon: Marilou Showman, DO;  Location: [facility];  Service: Plastics;  Laterality: Left;   kidney stone removal  2004   MINOR APPLICATION OF WOUND VAC Left 04/23/2014   Procedure: MINOR APPLICATION OF WOUND VAC;  Surgeon: Marilou Showman, DO;  Location: [facility];  Service: Plastics;  Laterality: Left;   SKIN SPLIT GRAFT Left 06/18/2014   Procedure: SKIN GRAFT SPLIT THICKNESS TO LOWER LEFT LEG WOUND WITH PLACEMENT OF VAC;  Surgeon: Marilou Showman, DO;  Location: [facility];  Service: Plastics;  Laterality: Left;   VASECTOMY  1994    Allergies  Allergen Reactions   Cashew Nut Oil Anaphylaxis    Cashews, 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   Pistachio Nut (Diagnostic) Hives, Shortness Of Breath, Itching, Swelling and  Rash   Latex Dermatitis   Tape Other (See Comments)    Tears and bruises the skin   Betadine [Povidone Iodine] Rash and Other (See Comments)    Raised rash 04/23/14   Lidocaine  Other (See Comments)    Minimal effect with lidocaine , prefers different anesthetic   Penicillins Rash and Other (See Comments)    Rash all over as a child    ROS denies any nausea, vomiting, fever, chills, chest pain, shortness of breath   Physical Exam: There were no vitals filed for this visit.  General: The patient is alert and oriented x3 in no acute distress.  Dermatology: Pinpoint focal area of  ulceration distal aspect of right third toe, appears as a sinus.  Following debridement this measures about 0.3 x 0.3 x 0.3 cm.  No deep probe to bone, no undermining, no purulence, no fluctuance, no crepitus.  No significant drainage noted on today's exam.  Left fourth toenail is loose, this was debrided today, underlying wound to fourth toe nailbed appears stable with granulation tissue no acute signs of infection..  Vascular: Faintly palpable pedal pulses bilaterally. Capillary refill within normal limits.  No diffuse edema.  Mild edema to the right third toe.  No erythema.  No warmth increased.  Diminished pedal hair growth.  Neurological: Protective sensation decreased  Musculoskeletal Exam: Improved alignment of right third toe, decrease of flexor contracture.  Charcot foot deformity noted.  Radiographic Exam: Right foot 3 views weightbearing 09/14/2023 Joint spaces preserved.  On close review of imaging from February, there are signs of very mild osteolysis and cortical irregularities at the distal third toe.  This does appear relatively stable from films from 3/20 and from 4/3  Assessment/Plan of Care: 1. 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 PVD (peripheral vascular disease) (HCC)   3. Type 2 diabetes mellitus with vascular disease (HCC)   4. Traumatic avulsion of nail plate of toe, initial encounter      Meds ordered this encounter  Medications   mupirocin  ointment (BACTROBAN ) 2 %    Sig: Apply 1 Application topically 2 (two) times daily.    Dispense:  30 g    Refill:  2   doxycycline  (VIBRA -TABS) 100 MG tablet    Sig: Take 1 tablet (100 mg total) by mouth 2 (two) times daily for 21 days.    Dispense:  42 tablet    Refill:  0   AMB REFERRAL TO INFECTIOUS DISEASE  Discussed clinical findings with patient today.  Radiographs discussed with patient at length.  Minimal changes from previous there are some signs concerning for osteomyelitis as previously noted.  Do not  think that an MRI would be very useful at this point, likely that due to the long-term ulceration, would expect to see edema in and around the distal phalanx even in the setting of no osteomyelitis.  Lengthy discussion with patient and daughter regarding treatment options. Patient again expresses desire to avoid amputation. I think he needs IV antibiotics to have the best chance at clearing infection. He is reluctant but understands this.  Referral placed to infectious disease.  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  Extending course of doxycycline  in the interim.  Continue local wound care to the right third toe. Debrided loose nail plate to left fourth toe, does appear stable.  Apply mupirocin  to this  as well.  Refill of mupirocin  sent in.  Excisional debridement of the ulceration performed today using 312 scalpel blade of nonviable skin and subcutaneous tissue down to patient tolerance.  Postdebridement measurement/described above.  Mupirocin  and bandage applied today.  Instructed patient to monitor closely for any signs of infection including increased drainage, worsening redness, swelling, worsening pain or developing systemic signs of infection such as nausea, vomiting, fever, chills that he would need to call our office immediately or seek medical attention and we would need to proceed with surgical intervention.  They expressed good understanding.  ABIs pending, scheduled for early May  Follow-up in 3 weeks for ulcer check  [name] [name], AACFAS [facility]     [address] [place], [place] [address]                Office [phone]  Fax [phone]

## 2023-09-15 ENCOUNTER — Other Ambulatory Visit: Payer: Self-pay

## 2023-09-15 ENCOUNTER — Encounter: Payer: Self-pay | Admitting: Podiatry

## 2023-09-15 ENCOUNTER — Telehealth: Payer: Self-pay | Admitting: *Deleted

## 2023-09-15 NOTE — Telephone Encounter (Signed)
 Alert received from [employer]:  Unscheduled remote transmission: PII, called for disconnection. Battery triggered RRT on 09/10/23; to Triage for review.  Normal device function  _________________________________________________________________________________  ILR @ RRT   ILR reached RRT: 09/10/23 Patient called, discussed options to leave device in or explanted.  Patient would like to know if he can get a new loop device. Patient spouse will speak with patient over weekend to discuss further plan.   Marked "I" in Paceart: Done Enter note in Paceart: Done Canceled future remotes: Done  Discontinued from website: Done Entered in Speciality Comments: Done  Advised if further questions arise to please call the device clinic at [phone].

## 2023-09-16 ENCOUNTER — Other Ambulatory Visit (HOSPITAL_COMMUNITY): Payer: Self-pay

## 2023-09-18 ENCOUNTER — Ambulatory Visit: Admitting: Podiatry

## 2023-09-18 ENCOUNTER — Other Ambulatory Visit: Payer: Self-pay | Admitting: Podiatry

## 2023-09-18 ENCOUNTER — Other Ambulatory Visit: Payer: Self-pay | Admitting: Family Medicine

## 2023-09-18 ENCOUNTER — Other Ambulatory Visit: Payer: Self-pay

## 2023-09-18 ENCOUNTER — Other Ambulatory Visit (HOSPITAL_COMMUNITY): Payer: Self-pay

## 2023-09-18 DIAGNOSIS — M2041 Other hammer toe(s) (acquired), right foot: Secondary | ICD-10-CM

## 2023-09-18 MED ORDER — OXYCODONE-ACETAMINOPHEN 5-325 MG PO TABS
1.0000 | ORAL_TABLET | Freq: Four times a day (QID) | ORAL | 0 refills | Status: DC | PRN
  Filled 2023-09-18: qty 12, 3d supply, fill #0

## 2023-09-18 MED ORDER — TRIAMCINOLONE ACETONIDE 0.1 % EX CREA
1.0000 | TOPICAL_CREAM | Freq: Two times a day (BID) | CUTANEOUS | 1 refills | Status: DC
  Filled 2023-09-21: qty 454, 90d supply, fill #0
  Filled 2023-11-25 – 2024-01-01 (×2): qty 454, 90d supply, fill #1

## 2023-09-18 NOTE — Telephone Encounter (Signed)
 Last office visit 08/30/2023 for CPE.  Last refilled 04/17/2023 for 454 g with 1 refill.  Next appt: 02/29/24 for 6 month follow up.

## 2023-09-18 NOTE — Progress Notes (Signed)
 [facility] Summary Report / Loop Recorder

## 2023-09-20 ENCOUNTER — Other Ambulatory Visit: Payer: Self-pay | Admitting: Podiatry

## 2023-09-20 DIAGNOSIS — M2041 Other hammer toe(s) (acquired), right foot: Secondary | ICD-10-CM

## 2023-09-21 ENCOUNTER — Other Ambulatory Visit: Payer: Self-pay | Admitting: Family Medicine

## 2023-09-21 ENCOUNTER — Other Ambulatory Visit: Payer: Self-pay

## 2023-09-25 ENCOUNTER — Other Ambulatory Visit (HOSPITAL_COMMUNITY): Payer: Self-pay

## 2023-09-29 ENCOUNTER — Ambulatory Visit: Admitting: Cardiology

## 2023-09-29 ENCOUNTER — Other Ambulatory Visit (HOSPITAL_COMMUNITY): Payer: Self-pay

## 2023-09-29 ENCOUNTER — Other Ambulatory Visit: Payer: Self-pay

## 2023-10-02 ENCOUNTER — Other Ambulatory Visit: Payer: Self-pay | Admitting: Podiatry

## 2023-10-02 DIAGNOSIS — M869 Osteomyelitis, unspecified: Secondary | ICD-10-CM

## 2023-10-02 MED ORDER — DOXYCYCLINE HYCLATE 100 MG PO TABS
100.0000 mg | ORAL_TABLET | Freq: Two times a day (BID) | ORAL | 0 refills | Status: DC
  Filled 2023-10-04: qty 42, 21d supply, fill #0

## 2023-10-03 ENCOUNTER — Encounter: Payer: Self-pay | Admitting: Infectious Diseases

## 2023-10-03 ENCOUNTER — Other Ambulatory Visit: Payer: Self-pay

## 2023-10-03 ENCOUNTER — Ambulatory Visit: Admitting: Infectious Diseases

## 2023-10-03 ENCOUNTER — Other Ambulatory Visit (HOSPITAL_COMMUNITY): Payer: Self-pay

## 2023-10-03 VITALS — BP 133/83 | HR 86 | Temp 97.4°F | Ht 70.0 in | Wt 293.0 lb

## 2023-10-03 DIAGNOSIS — I739 Peripheral vascular disease, unspecified: Secondary | ICD-10-CM

## 2023-10-03 DIAGNOSIS — Z79899 Other long term (current) drug therapy: Secondary | ICD-10-CM | POA: Insufficient documentation

## 2023-10-03 DIAGNOSIS — Z880 Allergy status to penicillin: Secondary | ICD-10-CM

## 2023-10-03 DIAGNOSIS — M869 Osteomyelitis, unspecified: Secondary | ICD-10-CM | POA: Insufficient documentation

## 2023-10-03 DIAGNOSIS — L089 Local infection of the skin and subcutaneous tissue, unspecified: Secondary | ICD-10-CM | POA: Insufficient documentation

## 2023-10-03 DIAGNOSIS — E1151 Type 2 diabetes mellitus with diabetic peripheral angiopathy without gangrene: Secondary | ICD-10-CM

## 2023-10-03 NOTE — Progress Notes (Unsigned)
 Patient Active Problem List   Diagnosis Date Noted   Skin ulcer of third toe (HCC) 07/05/2023   Claudication in peripheral vascular disease (HCC) 05/07/2022   PAF (paroxysmal atrial fibrillation) (HCC) 08/31/2020   Panic attacks 06/23/2020   Aortic calcification (HCC) 02/22/2020   Carotid artery calcification, bilateral 02/22/2020   MDD (major depressive disorder), recurrent episode (HCC)    GERD (gastroesophageal reflux disease)    Ischemic stroke (HCC), occipital    Diabetic polyneuropathy associated with type 2 diabetes mellitus (HCC) 06/30/2018   Erectile dysfunction due to type 2 diabetes mellitus (HCC) 06/30/2018   Circulation disorder of lower extremity 05/31/2015   IBS (irritable bowel syndrome) 12/24/2014   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   ALCOHOL  SWABS  (B-D SINGLE USE SWABS  REGULAR) PADS    Use to check blood sugar daily   BLOOD GLUCOSE MONITORING SUPPL (BLOOD GLUCOSE MONITOR SYSTEM) W/DEVICE KIT    Use as directed once daily.   BUPROPION  (WELLBUTRIN  XL) 150 MG 24 HR TABLET    Take 1 tablet (150 mg total) by mouth daily.   CETIRIZINE (ZYRTEC) 10 MG TABLET    Take 10 mg by mouth daily.   CLONAZEPAM  (KLONOPIN ) 0.5 MG TABLET    Take 1 tablet (0.5 mg total) by mouth 2 (two) times daily.   COVID-19 MRNA VACCINE, PFIZER, (COMIRNATY) SYRINGE    Inject into the muscle.   DILTIAZEM  (CARDIZEM ) 30 MG  TABLET    TAKE 1 TABLET BY MOUTH EVERY 4 HOURS AS NEEDED FOR HEART RATE>100.   DOXYCYCLINE  (VIBRA -TABS) 100 MG TABLET    Take 1 tablet (100 mg total) by mouth 2 (two) times daily for 21 days.   DULAGLUTIDE  (TRULICITY ) 4.5 MG/0.5ML SOAJ    Inject 4.5 mg into the skin once a week.   DULOXETINE  (CYMBALTA ) 30 MG CAPSULE    Take 2 capsules (60 mg total) by mouth daily AND 1 capsule (30 mg total) at bedtime.   DULOXETINE  (CYMBALTA ) 60 MG CAPSULE    Take 1 capsule (60 mg total) by mouth 2 (two) times daily.   EMPAGLIFLOZIN  (JARDIANCE ) 25 MG TABS TABLET    Take 1 tablet (25 mg total) by mouth daily before breakfast.   EPINEPHRINE  0.3 MG/0.3 ML IJ SOAJ INJECTION    Inject 0.3 mg into the muscle as needed for anaphylaxis.   FLUOXETINE  (PROZAC ) 40 MG CAPSULE    Take 2 capsules (80 mg total) by mouth daily.   GLUCOSE BLOOD (BLOOD GLUCOSE TEST STRIPS) STRP    Use as directed once daily.   HYDROCHLOROTHIAZIDE  (HYDRODIURIL ) 25 MG TABLET    Take 1 tablet (25 mg total) by mouth daily.   HYDROXYZINE (ATARAX) 10 MG TABLET    Take 10 mg by mouth 2 (two) times daily as needed.   LANCET DEVICE MISC    Use as directed in the morning, at noon, and at bedtime.  LANCETS MISC    Use as directed in the morning, at noon, and at bedtime.   LISINOPRIL  (ZESTRIL ) 10 MG TABLET    Take 1 tablet (10 mg total) by mouth daily.   METFORMIN  (GLUCOPHAGE -XR) 500 MG 24 HR TABLET    Take 4 tablets (2,000 mg total) by mouth daily in the afternoon.   MULTIPLE VITAMIN (MULTIVITAMIN) TABLET    Take 1 tablet by mouth daily.   MUPIROCIN  OINTMENT (BACTROBAN ) 2 %    Apply 1 Application topically 2 (two) times daily.   OXYCODONE -ACETAMINOPHEN  (PERCOCET/ROXICET) 5-325 MG TABLET    Take 1 tablet by mouth every 6 (six) hours as needed for up to 12 doses for severe pain (pain score 7-10).   PANTOPRAZOLE  (PROTONIX ) 40 MG TABLET    Take 1 tablet (40 mg total) by mouth daily.   PENTOXIFYLLINE  (TRENTAL ) 400 MG CR TABLET    Take 1 tablet (400 mg total) by  mouth 3 (three) times daily with meals.   PREGABALIN  (LYRICA ) 300 MG CAPSULE    TAKE 1 CAPSULE(300 MG) BY MOUTH TWICE DAILY   REPAGLINIDE  (PRANDIN ) 1 MG TABLET    Take 0.5 tablets (0.5 mg total) by mouth 2 (two) times daily before a meal.   ROPINIROLE  (REQUIP ) 0.5 MG TABLET    Take 1 tablet (0.5 mg total) by mouth 2 (two) times daily.   ROSUVASTATIN  (CRESTOR ) 20 MG TABLET    Take 1 tablet (20 mg total) by mouth at bedtime.   STUDY - LIBREXIA-AF - APIXABAN  5 MG OR PLACEBO CAPSULE (PI-SETHI)    Take 1 capsule (5 mg total) by mouth 2 (two) times daily. Take at approximately the same time of day with or without food. Please bring bottle back with you to every visit; do not discard bottle. Please contact [facility] for any questions or concerns regarding this medication. For Investigational Use Only.   STUDY - LIBREXIA-AF - [id number] (MILVEXIAN) 100 MG OR PLACEBO TABLET (PI-SETHI)    Take 1 tablet by mouth 2 (two) times daily. Take at approximately the same time of day with or without food. Please bring bottle back with you to every visit; do not discard bottle. Please contact [facility] for any questions or concerns regarding this medication. For Investigational Use Only.   TRIAMCINOLONE  CREAM (KENALOG ) 0.1 %    Apply 1 Application topically 2 (two) times daily.   TRUEPLUS LANCETS 33G MISC    Use to check blood sugar 2 times a day  Modified Medications   No medications on file  Discontinued Medications   No medications on file    Subjective: Discussed the use of AI scribe software for clinical note transcription with the patient, who gave verbal consent to proceed.   63 year old male with prior h/o DM2 w polyneuropathy, anxiety/depression, PVD, HTN, HLD, Kidney stones, Obesity, CVA w rt sided weakness, PAF, RLS and tobacco abuse who is referred from Podiatry for concerns for rt third toe osteomyelitis.  He is accompanied by his daughter. He reports having a  non-healing wound on the plantar surface of his rt middle toe, persisting for approximately six months after a callus was cut too deeply. The wound has shown slow improvement but with occasional serous drainage, noted as of this morning. He has completed two 21-day courses of doxycycline  over the past two months without full resolution of the wound, raising concerns for osteomyelitis.  Reports DM is well-controlled as well as has experienced ten strokes, resulting in cognitive  impairment and bilateral hemiparesis, affecting his gait. He has restless leg syndrome and has undergone laser therapy for circulation issues.  He experiences nausea, attributed to Trulicity , and had a single episode of diarrhea. He denies fever, chills, vomiting, or other gastrointestinal symptoms.   He resides with his wife and daughter, who assist with his care, including wound dressing. He uses walls for support when ambulating indoors and requires additional support outdoors.  Review of Systems: all systems reviewed with pertinent positives and negatives as listed above  Past Medical History:  Diagnosis Date   Angio-edema    Anxiety    Claudication in peripheral vascular disease (HCC) 05/07/2022   Depression    Diabetes mellitus type II    Diabetic polyneuropathy associated with type 2 diabetes mellitus (HCC) 06/30/2018   GERD (gastroesophageal reflux disease)    History of kidney stones    HLD (hyperlipidemia)    HTN (hypertension)    Morbid obesity (HCC)    PAF (paroxysmal atrial fibrillation) (HCC) 08/31/2020   Paroxysmal atrial fibrillation (HCC)    Restless legs syndrome (RLS) 11/20/2012   Stroke (HCC) 02/14/2020   ischemic/right sided affected   Tobacco abuse    Past Surgical History:  Procedure Laterality Date   Arm surgery Right 1969   Abscess excision and debridement at elbow   COLONOSCOPY WITH PROPOFOL  N/A 09/06/2016   Procedure: COLONOSCOPY WITH PROPOFOL ;  Surgeon: Albertina Hugger, MD;   Location: [facility];  Service: Gastroenterology;  Laterality: N/A;   I & D EXTREMITY Left 05/29/2014   Procedure: IRRIGATION AND DEBRIDEMENT OF LEFT LEG WOUND AND PLACEMENT OF  INTEGRA  AND  VAC;  Surgeon: Marilou Showman, DO;  Location: [facility];  Service: Plastics;  Laterality: Left;   implantable loop recorder implant  03/06/2020   Medtronic Reveal [place] model LNQ11 (SN  [id number]) implanted by Dr Nunzio Belch for cryptogenic stroke   INCISION AND DRAINAGE OF WOUND Left 04/23/2014   Procedure: IRRIGATION AND DEBRIDEMENT LOWER LEFT LEG WOUND WITH PLACEMENT OF INTEGRA AND VAC;  Surgeon: Marilou Showman, DO;  Location: [facility];  Service: Plastics;  Laterality: Left;   kidney stone removal  2004   MINOR APPLICATION OF WOUND VAC Left 04/23/2014   Procedure: MINOR APPLICATION OF WOUND VAC;  Surgeon: Marilou Showman, DO;  Location: [facility];  Service: Plastics;  Laterality: Left;   SKIN SPLIT GRAFT Left 06/18/2014   Procedure: SKIN GRAFT SPLIT THICKNESS TO LOWER LEFT LEG WOUND WITH PLACEMENT OF VAC;  Surgeon: Marilou Showman, DO;  Location: [facility];  Service: Plastics;  Laterality: Left;   VASECTOMY  1994     Social History   Tobacco Use   Smoking status: Every Day    Current packs/day: 0.00    Types: Cigarettes, E-cigarettes    Last attempt to quit: 08/31/1973    Years since quitting: 50.1   Smokeless tobacco: Never   Tobacco comments:    Not smoking current     Patient states he vapes - 09/14/2023  Vaping Use   Vaping status: Every Day   Substances: Flavoring  Substance Use Topics   Alcohol  use: Not Currently    Alcohol /week: 0.0 standard drinks of alcohol     Comment: rarely   Drug use: No    Family History  Problem Relation Age of Onset   Depression Mother    Anxiety disorder Mother    Stroke Mother        multiple   Leukemia  Mother    Pneumonia Mother    Cancer Mother    Diabetes Mother    Diabetes Father     Heart disease Father    Hyperlipidemia Father    Hypertension Father    Allergic rhinitis Neg Hx    Angioedema Neg Hx    Asthma Neg Hx    Eczema Neg Hx    Immunodeficiency Neg Hx    Urticaria Neg Hx     Allergies  Allergen Reactions   Cashew Nut Oil Anaphylaxis    Cashews, 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   Pistachio Nut (Diagnostic) Hives, Shortness Of Breath, Itching, Swelling and Rash   Latex Dermatitis   Tape Other (See Comments)    Tears and bruises the skin   Betadine [Povidone Iodine] Rash and Other (See Comments)    Raised rash 04/23/14   Lidocaine  Other (See Comments)    Minimal effect with lidocaine , prefers different anesthetic   Penicillins Rash and Other (See Comments)    Rash all over as a child    Health Maintenance  Topic Date Due   OPHTHALMOLOGY EXAM  07/27/2023   INFLUENZA VACCINE  12/29/2023   HEMOGLOBIN A1C  01/18/2024   COVID-19 Vaccine (5 - 2024-25 season) 03/02/2024   FOOT EXAM  07/06/2024   Diabetic kidney evaluation - eGFR measurement  07/20/2024   Diabetic kidney evaluation - Urine ACR  07/20/2024   Medicare Annual Wellness (AWV)  08/29/2024   DTaP/Tdap/Td (2 - Td or Tdap) 04/20/2026   Colonoscopy  09/07/2026   Pneumococcal Vaccine 37-1 Years old  Completed   Hepatitis C Screening  Completed   HIV Screening  Completed   Zoster Vaccines- Shingrix   Completed   HPV VACCINES  Aged Out   Meningococcal B Vaccine  Aged Out    Objective: BP 133/83   Pulse 86   Temp (!) 97.4 F (36.3 C) (Oral)   Ht 5\' 10"  (1.778 m)   Wt 293 lb (132.9 kg)   SpO2 99%   BMI 42.04 kg/m    Physical Exam Constitutional:      Appearance: Normal appearance. Morbidly obese  HENT:     Head: Normocephalic and atraumatic.      Mouth: Mucous membranes are moist.  Eyes:    Conjunctiva/sclera: Conjunctivae normal.     Pupils: Pupils are equal, round, and b/l symmetrical    Cardiovascular:     Rate and Rhythm: Normal rate and Irregular rhythm.     Heart sounds:  s1s2  Pulmonary:     Effort: Pulmonary effort is normal.     Breath sounds: Normal breath sounds.   Abdominal:     General: Non distended     Palpations: soft.   Musculoskeletal:        General: ambulatory with support   Skin:    General: Skin is warm and dry.     Comments:  Rt third toe with healing wound at the plantar aspect, no signs of cellulitis, fluctuance or crepitus or active discharge  Neurological:     General:     Mental Status: awake, alert and oriented to person, place, and time.   Psychiatric:        Mood and Affect: Mood normal.   Lab Results Lab Results  Component Value Date   WBC 8.9 07/21/2023   HGB 14.5 07/21/2023   HCT 43.6 07/21/2023   MCV 90.6 07/21/2023   PLT 225.0 07/21/2023    Lab Results  Component Value Date   CREATININE 1.36 07/21/2023  BUN 24 (H) 07/21/2023   NA 138 07/21/2023   K 4.1 07/21/2023   CL 101 07/21/2023   CO2 30 07/21/2023    Lab Results  Component Value Date   ALT 16 07/21/2023   AST 15 07/21/2023   ALKPHOS 32 (L) 07/21/2023   BILITOT 0.6 07/21/2023    Lab Results  Component Value Date   CHOL 104 07/21/2023   HDL 35.50 (L) 07/21/2023   LDLCALC 27 07/21/2023   LDLDIRECT 76 02/19/2014   TRIG 205.0 (H) 07/21/2023   CHOLHDL 3 07/21/2023   No results found for: "LABRPR", "RPRTITER" No results found for: "HIV1RNAQUANT", "HIV1RNAVL", "CD4TABS"   Imaging per podiatry note Right foot 3 views weightbearing 09/14/2023 Joint spaces preserved.  On close review of imaging from February, there are signs of very mild osteolysis and cortical irregularities at the distal third toe.  This does appear relatively stable from films from 3/20 and from 4/3  Assessment/Plan # Possible Rt third toe osteomyelitis  - on prolonged course of PO doxycycline  with already signs of improvement and healing  - Do not think we need to step up to PICC line and IV antibiotics currently. Will get MRI Rt foot however - Labs today  - Fu in 7-10  days after MRI for next steps. Can continue PO doxycycline  pending MRI.  - Fu with podiatry as instructed    # DM  - discussed BG control   # PVD  - ABI scheduled for 5/8 - Fu with Vascular  # Penicillin allergy  - Reports h/o rashes almost 45 years ago  - Amb referral to allergy  # Morbid Obesity  - will benefit from weight loss   I have personally spent 75 minutes involved in face-to-face and non-face-to-face activities for this patient on the day of the visit. Professional time spent includes the following activities: Preparing to see the patient (review of tests), Obtaining and/or reviewing separately obtained history (admission/discharge record), Performing a medically appropriate examination and/or evaluation , Ordering medications/tests/procedures, referring and communicating with other health care professionals, Documenting clinical information in the EMR, Independently interpreting results (not separately reported), Communicating results to the patient/family/caregiver, Counseling and educating the patient/family/caregiver and Care coordination (not separately reported).   Of note, portions of this note may have been created with voice recognition software. While this note has been edited for accuracy, occasional wrong-word or 'sound-a-like' substitutions may have occurred due to the inherent limitations of voice recognition software.   Melvina Stage, MD [facility] 10/03/2023, 12:47 PM

## 2023-10-04 ENCOUNTER — Other Ambulatory Visit (HOSPITAL_COMMUNITY): Payer: Self-pay

## 2023-10-04 DIAGNOSIS — I739 Peripheral vascular disease, unspecified: Secondary | ICD-10-CM | POA: Insufficient documentation

## 2023-10-04 DIAGNOSIS — Z880 Allergy status to penicillin: Secondary | ICD-10-CM | POA: Insufficient documentation

## 2023-10-04 LAB — CBC
HCT: 49.5 % (ref 38.5–50.0)
Hemoglobin: 15.9 g/dL (ref 13.2–17.1)
MCH: 28.3 pg (ref 27.0–33.0)
MCHC: 32.1 g/dL (ref 32.0–36.0)
MCV: 88.1 fL (ref 80.0–100.0)
MPV: 12.2 fL (ref 7.5–12.5)
Platelets: 211 10*3/uL (ref 140–400)
RBC: 5.62 10*6/uL (ref 4.20–5.80)
RDW: 13.2 % (ref 11.0–15.0)
WBC: 8 10*3/uL (ref 3.8–10.8)

## 2023-10-04 LAB — COMPREHENSIVE METABOLIC PANEL WITH GFR
AG Ratio: 2.4 (calc) (ref 1.0–2.5)
ALT: 17 U/L (ref 9–46)
AST: 15 U/L (ref 10–35)
Albumin: 4.7 g/dL (ref 3.6–5.1)
Alkaline phosphatase (APISO): 34 U/L — ABNORMAL LOW (ref 35–144)
BUN: 20 mg/dL (ref 7–25)
CO2: 30 mmol/L (ref 20–32)
Calcium: 10.2 mg/dL (ref 8.6–10.3)
Chloride: 102 mmol/L (ref 98–110)
Creat: 1.29 mg/dL (ref 0.70–1.35)
Globulin: 2 g/dL (ref 1.9–3.7)
Glucose, Bld: 121 mg/dL — ABNORMAL HIGH (ref 65–99)
Potassium: 4.3 mmol/L (ref 3.5–5.3)
Sodium: 139 mmol/L (ref 135–146)
Total Bilirubin: 0.6 mg/dL (ref 0.2–1.2)
Total Protein: 6.7 g/dL (ref 6.1–8.1)
eGFR: 63 mL/min/{1.73_m2} (ref >=60)

## 2023-10-04 LAB — SEDIMENTATION RATE: Sed Rate: 2 mm/h (ref 0–20)

## 2023-10-04 LAB — C-REACTIVE PROTEIN: CRP: <3 mg/L (ref <8.0)

## 2023-10-05 ENCOUNTER — Other Ambulatory Visit: Payer: Self-pay

## 2023-10-05 ENCOUNTER — Ambulatory Visit (HOSPITAL_COMMUNITY)
Admission: RE | Admit: 2023-10-05 | Discharge: 2023-10-05 | Disposition: A | Discharge status: Home / Self Care | Source: Ambulatory Visit | Attending: Podiatry | Admitting: Podiatry

## 2023-10-05 ENCOUNTER — Other Ambulatory Visit (HOSPITAL_COMMUNITY): Payer: Self-pay

## 2023-10-05 DIAGNOSIS — L97512 Non-pressure chronic ulcer of other part of right foot with fat layer exposed: Secondary | ICD-10-CM | POA: Insufficient documentation

## 2023-10-06 ENCOUNTER — Encounter: Payer: Self-pay | Admitting: Podiatry

## 2023-10-06 ENCOUNTER — Ambulatory Visit (HOSPITAL_COMMUNITY)
Admission: RE | Admit: 2023-10-06 | Discharge: 2023-10-06 | Disposition: A | Discharge status: Home / Self Care | Source: Ambulatory Visit | Attending: Infectious Diseases | Admitting: Infectious Diseases

## 2023-10-06 DIAGNOSIS — M19071 Primary osteoarthritis, right ankle and foot: Secondary | ICD-10-CM | POA: Diagnosis not present

## 2023-10-06 DIAGNOSIS — E11628 Type 2 diabetes mellitus with other skin complications: Secondary | ICD-10-CM | POA: Insufficient documentation

## 2023-10-06 DIAGNOSIS — R600 Localized edema: Secondary | ICD-10-CM | POA: Diagnosis not present

## 2023-10-06 DIAGNOSIS — L089 Local infection of the skin and subcutaneous tissue, unspecified: Secondary | ICD-10-CM | POA: Diagnosis not present

## 2023-10-06 DIAGNOSIS — M7989 Other specified soft tissue disorders: Secondary | ICD-10-CM | POA: Diagnosis not present

## 2023-10-06 LAB — VAS US ABI WITH/WO TBI
Left ABI: 1.31
Right ABI: 1.45

## 2023-10-06 MED ORDER — GADOBUTROL 1 MMOL/ML IV SOLN
10.0000 mL | Freq: Once | INTRAVENOUS | Status: AC | PRN
  Administered 2023-10-06: 10 mL via INTRAVENOUS

## 2023-10-09 ENCOUNTER — Other Ambulatory Visit (HOSPITAL_COMMUNITY): Payer: Self-pay

## 2023-10-10 ENCOUNTER — Other Ambulatory Visit (HOSPITAL_COMMUNITY): Payer: Self-pay

## 2023-10-11 ENCOUNTER — Telehealth: Payer: Self-pay | Admitting: Neurology

## 2023-10-11 ENCOUNTER — Other Ambulatory Visit (HOSPITAL_COMMUNITY): Payer: Self-pay

## 2023-10-11 NOTE — Telephone Encounter (Signed)
 Ayanna from [employer]  call back to get Clarity on Pt disability Form . Called billing , billing directed me to medical records. Transferred to medical records.

## 2023-10-12 ENCOUNTER — Encounter: Payer: Self-pay | Admitting: Podiatry

## 2023-10-12 ENCOUNTER — Other Ambulatory Visit (HOSPITAL_COMMUNITY): Payer: Self-pay

## 2023-10-12 ENCOUNTER — Ambulatory Visit: Admitting: Podiatry

## 2023-10-12 DIAGNOSIS — M869 Osteomyelitis, unspecified: Secondary | ICD-10-CM | POA: Diagnosis not present

## 2023-10-12 DIAGNOSIS — L97512 Non-pressure chronic ulcer of other part of right foot with fat layer exposed: Secondary | ICD-10-CM

## 2023-10-12 DIAGNOSIS — L97511 Non-pressure chronic ulcer of other part of right foot limited to breakdown of skin: Secondary | ICD-10-CM

## 2023-10-12 DIAGNOSIS — M2041 Other hammer toe(s) (acquired), right foot: Secondary | ICD-10-CM

## 2023-10-12 DIAGNOSIS — M2042 Other hammer toe(s) (acquired), left foot: Secondary | ICD-10-CM

## 2023-10-12 MED ORDER — DOXYCYCLINE HYCLATE 100 MG PO TABS
100.0000 mg | ORAL_TABLET | Freq: Two times a day (BID) | ORAL | 0 refills | Status: DC
  Filled 2023-10-12 – 2023-10-20 (×4): qty 28, 14d supply, fill #0

## 2023-10-12 MED ORDER — MUPIROCIN 2 % EX OINT
1.0000 | TOPICAL_OINTMENT | Freq: Two times a day (BID) | CUTANEOUS | 2 refills | Status: DC
  Filled 2023-10-12: qty 44, 22d supply, fill #0
  Filled 2023-10-25 – 2023-10-27 (×3): qty 22, 11d supply, fill #0
  Filled 2023-11-07: qty 22, 30d supply, fill #1
  Filled 2023-11-25: qty 22, 15d supply, fill #2
  Filled 2023-12-05: qty 22, 30d supply, fill #2

## 2023-10-12 NOTE — Progress Notes (Signed)
 Chief Complaint  Patient presents with   Diabetic Ulcer    Wound check for right 3rd toe. It is calloused. Last A1c was 6.9 in feb. Doing an anti coag study currently, it is one of 2 different anti coag.  Date of procedure: 08/17/2023 Procedure: Right third toe flexor tenotomy  HPI: 63 y.o. male presents today status post right third toe flexor tenotomy and for treatment of ulceration to distal third toe.  He is accompanied by daughter, they have been keeping up with wound care.  He has been on doxycycline , which infectious disease has continued.  Currently the ulceration has callused over.  MRI has been obtained, read pending.  Patient does have concern for irritation to left second toe dorsal aspect with shoe gear.  Past Medical History:  Diagnosis Date   Angio-edema    Anxiety    Claudication in peripheral vascular disease (HCC) 05/07/2022   Depression    Diabetes mellitus type II    Diabetic polyneuropathy associated with type 2 diabetes mellitus (HCC) 06/30/2018   GERD (gastroesophageal reflux disease)    History of kidney stones    HLD (hyperlipidemia)    HTN (hypertension)    Morbid obesity (HCC)    PAF (paroxysmal atrial fibrillation) (HCC) 08/31/2020   Paroxysmal atrial fibrillation (HCC)    Restless legs syndrome (RLS) 11/20/2012   Stroke (HCC) 02/14/2020   ischemic/right sided affected   Tobacco abuse     Past Surgical History:  Procedure Laterality Date   Arm surgery Right 1969   Abscess excision and debridement at elbow   COLONOSCOPY WITH PROPOFOL  N/A 09/06/2016   Procedure: COLONOSCOPY WITH PROPOFOL ;  Surgeon: Albertina Hugger, MD;  Location: [facility];  Service: Gastroenterology;  Laterality: N/A;   I & D EXTREMITY Left 05/29/2014   Procedure: IRRIGATION AND DEBRIDEMENT OF LEFT LEG WOUND AND PLACEMENT OF  INTEGRA  AND  VAC;  Surgeon: Marilou Showman, DO;  Location: [facility];  Service: Plastics;  Laterality: Left;   implantable loop  recorder implant  03/06/2020   Medtronic Reveal [place] model LNQ11 (SN  [id number]) implanted by Dr Nunzio Belch for cryptogenic stroke   INCISION AND DRAINAGE OF WOUND Left 04/23/2014   Procedure: IRRIGATION AND DEBRIDEMENT LOWER LEFT LEG WOUND WITH PLACEMENT OF INTEGRA AND VAC;  Surgeon: Marilou Showman, DO;  Location: [facility];  Service: Plastics;  Laterality: Left;   kidney stone removal  2004   MINOR APPLICATION OF WOUND VAC Left 04/23/2014   Procedure: MINOR APPLICATION OF WOUND VAC;  Surgeon: Marilou Showman, DO;  Location: [facility];  Service: Plastics;  Laterality: Left;   SKIN SPLIT GRAFT Left 06/18/2014   Procedure: SKIN GRAFT SPLIT THICKNESS TO LOWER LEFT LEG WOUND WITH PLACEMENT OF VAC;  Surgeon: Marilou Showman, DO;  Location: [facility];  Service: Plastics;  Laterality: Left;   VASECTOMY  1994    Allergies  Allergen Reactions   Cashew Nut Oil Anaphylaxis    Cashews, 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   Pistachio Nut (Diagnostic) Hives, Shortness Of Breath, Itching, Swelling and Rash   Latex Dermatitis   Tape Other (See Comments)    Tears and bruises the skin   Betadine [Povidone Iodine] Rash and Other (See Comments)    Raised rash 04/23/14   Lidocaine  Other (See Comments)    Minimal effect with lidocaine , prefers different anesthetic   Penicillins Rash and Other (See Comments)    Rash all over as a  child    ROS denies any nausea, vomiting, fever, chills, chest pain, shortness of breath   Physical Exam: There were no vitals filed for this visit.  General: The patient is alert and oriented x3 in no acute distress.  Dermatology: Loosely adhered callus distal aspect of right third toe, underlying ulceration present appears as a sinus.  Following debridement this measures about 0.4 x 0.4 x 0.3 cm.  Does probe deep, uncertain if to level of bone  No significant drainage noted on today's exam.  Localized erythema left second toe dorsal PIPJ without  skin breakdown.  Vascular: Faintly palpable pedal pulses bilaterally. Capillary refill within normal limits.  No diffuse edema.  Decreased edema to the right third toe.  No erythema.  No warmth increased.  Diminished pedal hair growth.  Neurological: Protective sensation decreased  Musculoskeletal Exam: Improved alignment of right third toe, decrease of flexor contracture.  Charcot foot deformity noted.  Clawing of the left foot digits noted during gait as well  Radiographic Exam: Right foot 3 views weightbearing 09/14/2023 Joint spaces preserved.  On close review of imaging from February, there are signs of very mild osteolysis and cortical irregularities at the distal third toe.  This does appear relatively stable from films from 3/20 and from 4/3  Assessment/Plan of Care: 1. 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 Skin ulcer of third toe of right foot with fat layer exposed (HCC)   3. Hammertoe, bilateral   4. Skin ulcer of third toe of right foot, limited to breakdown of skin (HCC)      Meds ordered this encounter  Medications   doxycycline  (VIBRA -TABS) 100 MG tablet    Sig: Take 1 tablet (100 mg total) by mouth 2 (two) times daily for 14 days.    Dispense:  28 tablet    Refill:  0   mupirocin  ointment (BACTROBAN ) 2 %    Sig: Apply 1 Application topically 2 (two) times daily.    Dispense:  30 g    Refill:  2   None  Discussed clinical findings with patient today.  Excisional debridement of the ulceration performed today using 312 scalpel blade of nonviable skin and subcutaneous tissue down to patient tolerance.  Postdebridement measurement/described above.  Mupirocin  and bandage applied today.  Refill of mupirocin  and of doxycycline  sent in.  MRI has been obtained on 5/9, read currently pending.  Did discuss that we will need to wait and see what results show.  Again discussed if there is bone infection my recommendation for definitive treatment would be surgical resection  of infected bone, likely partial right third toe amputation.  Patient has been resistant to amputation to this point.  I have reviewed recent infectious disease note, holding off on PICC line for now.  Did have ABIs performed showing noncompressible arteries bilaterally with abnormal toe pressures, right worse than left toe pressures.  Left foot hammertoes with clawing of the digits, did dispense gel toe cap today for the second toe.  Patient is asking about potential flexor tenotomy for this toe as he feels that the top of the toe rubs against his shoes due to clawing when he walks.  We may consider this going forward.  Discussed signs and symptoms of worsening infection with patient and instructed them to contact office immediately or to go to the emergency room if these develop.  Follow-up in 2 weeks for ulcer check  Odelle Kosier L. Lunda Salines, AACFAS [facility]  [address] [place], [place] [address]                Office [phone]  Fax [phone]

## 2023-10-13 ENCOUNTER — Other Ambulatory Visit (HOSPITAL_COMMUNITY): Payer: Self-pay

## 2023-10-14 ENCOUNTER — Other Ambulatory Visit: Payer: Self-pay | Admitting: Family Medicine

## 2023-10-14 DIAGNOSIS — E1159 Type 2 diabetes mellitus with other circulatory complications: Secondary | ICD-10-CM

## 2023-10-16 ENCOUNTER — Other Ambulatory Visit: Payer: Self-pay | Admitting: Family Medicine

## 2023-10-16 ENCOUNTER — Other Ambulatory Visit (HOSPITAL_COMMUNITY): Payer: Self-pay

## 2023-10-16 ENCOUNTER — Telehealth: Payer: Self-pay | Admitting: Psychiatry

## 2023-10-16 MED ORDER — BD SWAB SINGLE USE REGULAR PADS
MEDICATED_PAD | 3 refills | Status: AC
  Filled 2023-10-16: qty 100, 100d supply, fill #0
  Filled 2024-03-16: qty 100, 100d supply, fill #1
  Filled 2024-06-26: qty 100, 100d supply, fill #2

## 2023-10-16 NOTE — Telephone Encounter (Signed)
 [name] called at 1:49 and LM to request refill of his Fluoxetine .  Appt 9/15.  Send to  [facility]

## 2023-10-16 NOTE — Telephone Encounter (Signed)
 Patient has been seeing Polly Brink. Is scheduled 9/15 with Dr. Toi Foster. Is this correct?

## 2023-10-17 ENCOUNTER — Other Ambulatory Visit: Payer: Self-pay

## 2023-10-17 ENCOUNTER — Encounter: Payer: Self-pay | Admitting: Podiatry

## 2023-10-17 ENCOUNTER — Ambulatory Visit (INDEPENDENT_AMBULATORY_CARE_PROVIDER_SITE_OTHER): Admitting: Podiatry

## 2023-10-17 ENCOUNTER — Other Ambulatory Visit: Payer: Self-pay | Admitting: Podiatry

## 2023-10-17 ENCOUNTER — Other Ambulatory Visit (HOSPITAL_COMMUNITY): Payer: Self-pay

## 2023-10-17 ENCOUNTER — Other Ambulatory Visit: Payer: Self-pay | Admitting: Behavioral Health

## 2023-10-17 VITALS — Ht 70.0 in | Wt 293.0 lb

## 2023-10-17 DIAGNOSIS — M79676 Pain in unspecified toe(s): Secondary | ICD-10-CM

## 2023-10-17 DIAGNOSIS — B351 Tinea unguium: Secondary | ICD-10-CM | POA: Diagnosis not present

## 2023-10-17 DIAGNOSIS — M2042 Other hammer toe(s) (acquired), left foot: Secondary | ICD-10-CM

## 2023-10-17 DIAGNOSIS — L84 Corns and callosities: Secondary | ICD-10-CM | POA: Diagnosis not present

## 2023-10-17 DIAGNOSIS — F411 Generalized anxiety disorder: Secondary | ICD-10-CM

## 2023-10-17 DIAGNOSIS — I739 Peripheral vascular disease, unspecified: Secondary | ICD-10-CM

## 2023-10-17 DIAGNOSIS — E1142 Type 2 diabetes mellitus with diabetic polyneuropathy: Secondary | ICD-10-CM

## 2023-10-17 MED ORDER — FLUOXETINE HCL 40 MG PO CAPS
80.0000 mg | ORAL_CAPSULE | Freq: Every day | ORAL | 0 refills | Status: DC
  Filled 2023-10-17: qty 180, 90d supply, fill #0

## 2023-10-17 NOTE — Telephone Encounter (Signed)
 Fluoxetine  RF sent.

## 2023-10-17 NOTE — Telephone Encounter (Signed)
 This is not correct.  I have changed the appt to [facility] [place]. Same day and time and still mychart.

## 2023-10-19 ENCOUNTER — Other Ambulatory Visit: Payer: Self-pay

## 2023-10-19 MED ORDER — CLONAZEPAM 0.5 MG PO TABS
0.5000 mg | ORAL_TABLET | Freq: Two times a day (BID) | ORAL | 1 refills | Status: DC
  Filled 2023-10-19: qty 60, 30d supply, fill #0
  Filled 2024-01-01: qty 60, 30d supply, fill #1

## 2023-10-20 ENCOUNTER — Other Ambulatory Visit: Payer: Self-pay

## 2023-10-20 ENCOUNTER — Other Ambulatory Visit (HOSPITAL_COMMUNITY): Payer: Self-pay

## 2023-10-23 NOTE — Progress Notes (Signed)
 Subjective:  Patient ID: [name] [name]., male    DOB: 1961-01-28,  MRN: [id number]  [name] [name]. presents to clinic today for at risk footcare. Patient has h/o diabetes, neuropathy and PAD and is seen for  and corn(s) right foot and painful mycotic toenails that are difficult to trim. Painful toenails interfere with ambulation. Aggravating factors include wearing enclosed shoe gear. Pain is relieved with periodic professional debridement. Painful corns are aggravated when weightbearing when wearing enclosed shoe gear. Pain is relieved with periodic professional debridement.  He states left 4th toenail came off and he has been applying antibiotic cream daily. He is accompanied by his daughter, [name], on today's visit. Patient states he would like to continue his routine foot care with me even if he has to travel to another office. Chief Complaint  Patient presents with   Nail Problem    Pt is here for [facility] last A1C was 6.6 PCP is Dr Geralyn Knee and LOV was in April.   New problem(s): Patient has been under care of Dr. Marvis Sluder for diabetic ulcer distal tip of right 3rd digit.   PCP is Copland, Jolena Nay, MD.  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   Pistachio Nut (Diagnostic) Hives, Shortness Of Breath, Itching, Swelling and Rash   Latex Dermatitis   Tape Other (See Comments)    Tears and bruises the skin   Betadine [Povidone Iodine] Rash and Other (See Comments)    Raised rash 04/23/14   Lidocaine  Other (See Comments)    Minimal effect with lidocaine , prefers different anesthetic   Penicillins Rash and Other (See Comments)    Rash all over as a child   Review of Systems: Negative except as noted in the HPI.  Objective: No changes noted in today's physical examination. There were no vitals filed for this visit. [name] [name]. is a pleasant 63 y.o. male in NAD. AAO x 3.  Vascular Examination: Vascular status intact b/l with  palpable pedal pulses. Pedal hair present b/l. CFT immediate b/l. No edema. No pain with calf compression b/l. Skin temperature gradient WNL b/l. Evidence of chronic venous insufficiency b/l LE.  Neurological Examination: Pt has subjective symptoms of neuropathy. Protective sensation diminished with 10g monofilament b/l. Vibratory sensation diminished b/l.  Dermatological Examination: Pedal skin is warm and supple b/l LE. Scarring noted. No open wounds b/l LE. No interdigital macerations noted b/l LE.   Toenails 1, 2, 3, 5 left foot, 1, 2, 4, 5 right bilaterally elongated, discolored, dystrophic, thickened, and crumbly with subungual debris and tenderness to dorsal palpation.   Anonychia noted L 4th toe. Nailbed(s) and nailbed is healing. Hyperkeratotic lesion(s) distal tip of right 2nd toe.  No erythema, no edema, no drainage, no fluctuance.  Musculoskeletal Examination: Muscle strength 5/5 to b/l LE. Hammertoe deformity noted 2-5 b/l. Charcot deformity noted right foot. Patient ambulates with cane assistance.  Radiographs: None  Assessment/Plan: 1. Pain due to onychomycosis of toenail   2. Callus   3. PAD (peripheral artery disease) (HCC)   4. Diabetic polyneuropathy associated with type 2 diabetes mellitus (HCC)   -Patient's family member present. All questions/concerns addressed on today's visit. -Consent given for treatment as described below: -Examined patient. -Patient under care of Dr. Marvis Sluder for ulcer right 3rd digit. -Continue diabetic shoes daily. -Toenails left great toe, L 2nd toe, L 3rd toe, L 5th toe, right great toe, R 2nd toe, R 3rd toe, R 4th toe, and  R 5th toe debrided in length and girth without iatrogenic bleeding with sterile nail nipper and dremel.  -Callus(es) R 2nd toe pared utilizing sterile scalpel blade without complication or incident. Total number debrided =1. -Patient/POA to call should there be question/concern in the interim.   Return in about 9 weeks  (around 12/19/2023).  Luella Sager, DPM      [place] LOCATION: [address] [place] [address]                   Office [phone]   [facility] LOCATION: [address] [place], [place] [address] Office [phone]

## 2023-10-25 ENCOUNTER — Ambulatory Visit: Payer: Self-pay | Admitting: Infectious Diseases

## 2023-10-25 ENCOUNTER — Other Ambulatory Visit: Payer: Self-pay

## 2023-10-25 ENCOUNTER — Telehealth: Payer: Self-pay

## 2023-10-25 ENCOUNTER — Ambulatory Visit: Admitting: Infectious Diseases

## 2023-10-25 ENCOUNTER — Encounter: Payer: Self-pay | Admitting: Infectious Diseases

## 2023-10-25 ENCOUNTER — Other Ambulatory Visit (HOSPITAL_COMMUNITY): Payer: Self-pay

## 2023-10-25 VITALS — BP 102/68 | HR 89 | Resp 16 | Ht 70.0 in | Wt 292.4 lb

## 2023-10-25 DIAGNOSIS — Z452 Encounter for adjustment and management of vascular access device: Secondary | ICD-10-CM | POA: Insufficient documentation

## 2023-10-25 DIAGNOSIS — M869 Osteomyelitis, unspecified: Secondary | ICD-10-CM | POA: Diagnosis not present

## 2023-10-25 DIAGNOSIS — Z79899 Other long term (current) drug therapy: Secondary | ICD-10-CM | POA: Diagnosis not present

## 2023-10-25 MED ORDER — CIPROFLOXACIN HCL 750 MG PO TABS
750.0000 mg | ORAL_TABLET | Freq: Two times a day (BID) | ORAL | 1 refills | Status: DC
  Filled 2023-10-25: qty 60, 30d supply, fill #0
  Filled 2023-11-13 – 2023-11-17 (×2): qty 60, 30d supply, fill #1

## 2023-10-25 MED ORDER — DOXYCYCLINE HYCLATE 100 MG PO TABS
100.0000 mg | ORAL_TABLET | Freq: Two times a day (BID) | ORAL | 0 refills | Status: DC
Start: 2023-10-25 — End: 2023-11-28
  Filled 2023-10-25 – 2023-11-07 (×2): qty 28, 14d supply, fill #0

## 2023-10-25 NOTE — Progress Notes (Signed)
 Patient Active Problem List   Diagnosis Date Noted   Osteomyelitis of toe of right foot (HCC) 10/25/2023   Needs peripherally inserted central catheter (PICC) 10/25/2023   Penicillin allergy 10/04/2023   PVD (peripheral vascular disease) (HCC) 10/04/2023   Diabetic foot infection (HCC) 10/03/2023   Osteomyelitis (HCC) 10/03/2023   Medication management 10/03/2023   Skin ulcer of third toe (HCC) 07/05/2023   Claudication in peripheral vascular disease (HCC) 05/07/2022   PAF (paroxysmal atrial fibrillation) (HCC) 08/31/2020   Panic attacks 06/23/2020   Aortic calcification (HCC) 02/22/2020   Carotid artery calcification, bilateral 02/22/2020   MDD (major depressive disorder), recurrent episode (HCC)    GERD (gastroesophageal reflux disease)    Ischemic stroke (HCC), occipital    Diabetic polyneuropathy associated with type 2 diabetes mellitus (HCC) 06/30/2018   Erectile dysfunction due to type 2 diabetes mellitus (HCC) 06/30/2018   Circulation disorder of lower extremity 05/31/2015   IBS (irritable bowel syndrome) 12/24/2014   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  Previous Medications   ACETAMINOPHEN  (TYLENOL ) 500 MG TABLET    Take 2 tablets (1,000 mg total) by mouth 2 (two) times daily as needed (for pain).   ALCOHOL  SWABS  (B-D SINGLE USE SWABS  REGULAR) PADS    Use to check blood sugar daily   BLOOD GLUCOSE MONITORING SUPPL (BLOOD GLUCOSE MONITOR SYSTEM) W/DEVICE KIT    Use  as directed once daily.   BUPROPION  (WELLBUTRIN  XL) 150 MG 24 HR TABLET    Take 1 tablet (150 mg total) by mouth daily.   CETIRIZINE (ZYRTEC) 10 MG TABLET    Take 10 mg by mouth daily.   CLONAZEPAM  (KLONOPIN ) 0.5 MG TABLET    Take 1 tablet (0.5 mg total) by mouth 2 (two) times daily.   COVID-19 MRNA VACCINE, PFIZER, (COMIRNATY) SYRINGE    Inject into the muscle.   DILTIAZEM  (CARDIZEM ) 30 MG TABLET    TAKE 1 TABLET BY MOUTH EVERY 4 HOURS AS NEEDED FOR HEART RATE>100.   DULAGLUTIDE  (TRULICITY ) 4.5 MG/0.5ML SOAJ    Inject 4.5 mg into the skin once a week.   DULOXETINE  (CYMBALTA ) 30 MG CAPSULE    Take 2 capsules (60 mg total) by mouth daily AND 1 capsule (30 mg total) at bedtime.   DULOXETINE  (CYMBALTA ) 60 MG CAPSULE    Take 1 capsule (60 mg total) by mouth 2 (two) times daily.   EMPAGLIFLOZIN  (JARDIANCE ) 25 MG TABS TABLET    Take 1 tablet (25 mg total) by mouth daily before breakfast.   EPINEPHRINE  0.3 MG/0.3 ML IJ SOAJ INJECTION    Inject 0.3 mg into the muscle as needed for anaphylaxis.   FLUOXETINE  (PROZAC ) 40 MG CAPSULE    Take 2 capsules (80 mg total) by mouth daily.   GLUCOSE BLOOD (BLOOD GLUCOSE TEST STRIPS) STRP    Use as directed once daily.   HYDROCHLOROTHIAZIDE  (HYDRODIURIL ) 25 MG TABLET    Take 1 tablet (25 mg total) by mouth  daily.   HYDROXYZINE (ATARAX) 10 MG TABLET    Take 10 mg by mouth 2 (two) times daily as needed.   LANCET DEVICE MISC    Use as directed in the morning, at noon, and at bedtime.   LANCETS MISC    Use as directed in the morning, at noon, and at bedtime.   LISINOPRIL  (ZESTRIL ) 10 MG TABLET    Take 1 tablet (10 mg total) by mouth daily.   METFORMIN  (GLUCOPHAGE -XR) 500 MG 24 HR TABLET    Take 4 tablets (2,000 mg total) by mouth daily in the afternoon.   MULTIPLE VITAMIN (MULTIVITAMIN) TABLET    Take 1 tablet by mouth daily.   MUPIROCIN  OINTMENT (BACTROBAN ) 2 %    Apply 1 Application topically 2 (two) times daily.   OXYCODONE -ACETAMINOPHEN  (PERCOCET/ROXICET) 5-325 MG  TABLET    Take 1 tablet by mouth every 6 (six) hours as needed for up to 12 doses for severe pain (pain score 7-10).   PANTOPRAZOLE  (PROTONIX ) 40 MG TABLET    Take 1 tablet (40 mg total) by mouth daily.   PENTOXIFYLLINE  (TRENTAL ) 400 MG CR TABLET    Take 1 tablet (400 mg total) by mouth 3 (three) times daily with meals.   PREGABALIN  (LYRICA ) 300 MG CAPSULE    TAKE 1 CAPSULE(300 MG) BY MOUTH TWICE DAILY   REPAGLINIDE  (PRANDIN ) 1 MG TABLET    Take 0.5 tablets (0.5 mg total) by mouth 2 (two) times daily before a meal.   ROPINIROLE  (REQUIP ) 0.5 MG TABLET    Take 1 tablet (0.5 mg total) by mouth 2 (two) times daily.   ROSUVASTATIN  (CRESTOR ) 20 MG TABLET    Take 1 tablet (20 mg total) by mouth at bedtime.   STUDY - LIBREXIA-AF - APIXABAN  5 MG OR PLACEBO CAPSULE (PI-SETHI)    Take 1 capsule (5 mg total) by mouth 2 (two) times daily. Take at approximately the same time of day with or without food. Please bring bottle back with you to every visit; do not discard bottle. Please contact [facility] for any questions or concerns regarding this medication. For Investigational Use Only.   STUDY - LIBREXIA-AF - [id number] (MILVEXIAN) 100 MG OR PLACEBO TABLET (PI-SETHI)    Take 1 tablet by mouth 2 (two) times daily. Take at approximately the same time of day with or without food. Please bring bottle back with you to every visit; do not discard bottle. Please contact [facility] for any questions or concerns regarding this medication. For Investigational Use Only.   TRIAMCINOLONE  CREAM (KENALOG ) 0.1 %    Apply 1 Application topically 2 (two) times daily.   TRUEPLUS LANCETS 33G MISC    Use to check blood sugar 2 times a day  Modified Medications   Modified Medication Previous Medication   DOXYCYCLINE  (VIBRA -TABS) 100 MG TABLET doxycycline  (VIBRA -TABS) 100 MG tablet      Take 1 tablet (100 mg total) by mouth 2 (two) times daily for 14 days.    Take 1 tablet (100 mg total) by mouth 2  (two) times daily for 14 days.  Discontinued Medications   No medications on file    Subjective: Discussed the use of AI scribe software for clinical note transcription with the patient, who gave verbal consent to proceed.   63 year old male with prior h/o DM2 w polyneuropathy, anxiety/depression, PVD, HTN, HLD, Kidney stones, Obesity, CVA w rt sided weakness, PAF, RLS and tobacco abuse who is referred from Podiatry for concerns  for rt third toe osteomyelitis.  He is accompanied by his daughter. He reports having a non-healing wound on the plantar surface of his rt middle toe, persisting for approximately six months after a callus was cut too deeply. The wound has shown slow improvement but with occasional serous drainage, noted as of this morning. He has completed two 21-day courses of doxycycline  over the past two months without full resolution of the wound, raising concerns for osteomyelitis.  Reports DM is well-controlled as well as has experienced ten strokes, resulting in cognitive impairment and bilateral hemiparesis, affecting his gait. He has restless leg syndrome and has undergone laser therapy for circulation issues.  He experiences nausea, attributed to Trulicity , and had a single episode of diarrhea. He denies fever, chills, vomiting, or other gastrointestinal symptoms.   He resides with his wife and daughter, who assist with his care, including wound dressing. He uses walls for support when ambulating indoors and requires additional support outdoors.  5/28 Taking PO doxyccyline. Discussed lab results from last visit as well as MRI findings. Seen by Dr Marvis Sluder on 5/15 and patient does not want amputation. Seen by Dr Denece Finger on 5/20 for debridement of toe nails and callus. Discussed plan of care with daughter and wife over phonewith PICC line and IV antibiotics for osteomyelitis. No other concerns.   Review of Systems: all systems reviewed with pertinent positives and negatives as  listed above  Past Medical History:  Diagnosis Date   Angio-edema    Anxiety    Claudication in peripheral vascular disease (HCC) 05/07/2022   Depression    Diabetes mellitus type II    Diabetic polyneuropathy associated with type 2 diabetes mellitus (HCC) 06/30/2018   GERD (gastroesophageal reflux disease)    History of kidney stones    HLD (hyperlipidemia)    HTN (hypertension)    Morbid obesity (HCC)    PAF (paroxysmal atrial fibrillation) (HCC) 08/31/2020   Paroxysmal atrial fibrillation (HCC)    Restless legs syndrome (RLS) 11/20/2012   Stroke (HCC) 02/14/2020   ischemic/right sided affected   Tobacco abuse    Past Surgical History:  Procedure Laterality Date   Arm surgery Right 1969   Abscess excision and debridement at elbow   COLONOSCOPY WITH PROPOFOL  N/A 09/06/2016   Procedure: COLONOSCOPY WITH PROPOFOL ;  Surgeon: Albertina Hugger, MD;  Location: [facility];  Service: Gastroenterology;  Laterality: N/A;   I & D EXTREMITY Left 05/29/2014   Procedure: IRRIGATION AND DEBRIDEMENT OF LEFT LEG WOUND AND PLACEMENT OF  INTEGRA  AND  VAC;  Surgeon: Marilou Showman, DO;  Location: [facility];  Service: Plastics;  Laterality: Left;   implantable loop recorder implant  03/06/2020   Medtronic Reveal Satartia model LNQ11 (SN  [id number]) implanted by Dr Nunzio Belch for cryptogenic stroke   INCISION AND DRAINAGE OF WOUND Left 04/23/2014   Procedure: IRRIGATION AND DEBRIDEMENT LOWER LEFT LEG WOUND WITH PLACEMENT OF INTEGRA AND VAC;  Surgeon: Marilou Showman, DO;  Location: [facility];  Service: Plastics;  Laterality: Left;   kidney stone removal  2004   MINOR APPLICATION OF WOUND VAC Left 04/23/2014   Procedure: MINOR APPLICATION OF WOUND VAC;  Surgeon: Marilou Showman, DO;  Location: [facility];  Service: Plastics;  Laterality: Left;   SKIN SPLIT GRAFT Left 06/18/2014   Procedure: SKIN GRAFT SPLIT THICKNESS TO LOWER LEFT LEG WOUND WITH PLACEMENT OF VAC;   Surgeon: Marilou Showman, DO;  Location: [facility];  Service:  Plastics;  Laterality: Left;   VASECTOMY  1994     Social History   Tobacco Use   Smoking status: Former    Current packs/day: 0.00    Types: E-cigarettes, Cigarettes    Quit date: 08/31/1973    Years since quitting: 50.1    Passive exposure: Never   Smokeless tobacco: Never   Tobacco comments:    Not smoking current /    Patient states he vapes - 10/25/2023  Vaping Use   Vaping status: Every Day   Substances: Nicotine, Flavoring  Substance Use Topics   Alcohol  use: Not Currently    Alcohol /week: 0.0 standard drinks of alcohol     Comment: rarely   Drug use: No    Family History  Problem Relation Age of Onset   Depression Mother    Anxiety disorder Mother    Stroke Mother        multiple   Leukemia Mother    Pneumonia Mother    Cancer Mother    Diabetes Mother    Diabetes Father    Heart disease Father    Hyperlipidemia Father    Hypertension Father    Allergic rhinitis Neg Hx    Angioedema Neg Hx    Asthma Neg Hx    Eczema Neg Hx    Immunodeficiency Neg Hx    Urticaria Neg Hx     Allergies  Allergen Reactions   Cashew Nut Oil Anaphylaxis    Cashews, 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   Pistachio Nut (Diagnostic) Hives, Shortness Of Breath, Itching, Swelling and Rash   Latex Dermatitis   Tape Other (See Comments)    Tears and bruises the skin   Betadine [Povidone Iodine] Rash and Other (See Comments)    Raised rash 04/23/14   Lidocaine  Other (See Comments)    Minimal effect with lidocaine , prefers different anesthetic   Penicillins Rash and Other (See Comments)    Rash all over as a child    Health Maintenance  Topic Date Due   OPHTHALMOLOGY EXAM  07/27/2023   INFLUENZA VACCINE  12/29/2023   HEMOGLOBIN A1C  01/18/2024   COVID-19 Vaccine (5 - 2024-25 season) 03/02/2024   FOOT EXAM  07/06/2024   Diabetic kidney evaluation - Urine ACR  07/20/2024   Medicare Annual Wellness (AWV)   08/29/2024   Diabetic kidney evaluation - eGFR measurement  10/02/2024   DTaP/Tdap/Td (2 - Td or Tdap) 04/20/2026   Colonoscopy  09/07/2026   Pneumococcal Vaccine 62-40 Years old  Completed   Hepatitis C Screening  Completed   HIV Screening  Completed   Zoster Vaccines- Shingrix   Completed   HPV VACCINES  Aged Out   Meningococcal B Vaccine  Aged Out    Objective: BP 102/68   Pulse 89   Resp 16   Ht 5\' 10"  (1.778 m)   Wt 292 lb 6.4 oz (132.6 kg)   SpO2 97%   BMI 41.96 kg/m    Physical Exam Constitutional:      Appearance: Normal appearance. Morbidly obese  HENT:     Head: Normocephalic and atraumatic.      Mouth: Mucous membranes are moist.  Eyes:    Conjunctiva/sclera: Conjunctivae normal.     Pupils: Pupils are equal, round, and b/l symmetrical    Cardiovascular:     Rate and Rhythm: Normal rate and Irregular rhythm.     Heart sounds:  Pulmonary:     Effort: Pulmonary effort is normal.     Breath sounds:   Abdominal:  General: Non distended     Palpations:   Musculoskeletal:        General: ambulatory with support   Skin:    General: Skin is warm and dry.     Comments:  Rt third toe wound appears stable   Neurological:     General:     Mental Status: awake, alert and oriented to person, place, and time.   Psychiatric:        Mood and Affect: Mood normal.   Lab Results Lab Results  Component Value Date   WBC 8.0 10/03/2023   HGB 15.9 10/03/2023   HCT 49.5 10/03/2023   MCV 88.1 10/03/2023   PLT 211 10/03/2023    Lab Results  Component Value Date   CREATININE 1.29 10/03/2023   BUN 20 10/03/2023   NA 139 10/03/2023   K 4.3 10/03/2023   CL 102 10/03/2023   CO2 30 10/03/2023    Lab Results  Component Value Date   ALT 17 10/03/2023   AST 15 10/03/2023   ALKPHOS 32 (L) 07/21/2023   BILITOT 0.6 10/03/2023    Lab Results  Component Value Date   CHOL 104 07/21/2023   HDL 35.50 (L) 07/21/2023   LDLCALC 27 07/21/2023   LDLDIRECT 76  02/19/2014   TRIG 205.0 (H) 07/21/2023   CHOLHDL 3 07/21/2023   No results found for: "LABRPR", "RPRTITER" No results found for: "HIV1RNAQUANT", "HIV1RNAVL", "CD4TABS"   Imaging MR FOOT RIGHT W WO CONTRAST Result Date: 10/24/2023 CLINICAL DATA:  Right third toe infection EXAM: MRI OF THE RIGHT FOREFOOT WITHOUT AND WITH CONTRAST TECHNIQUE: Multiplanar, multisequence MR imaging of the right forefoot was performed before and after the administration of intravenous contrast. CONTRAST:  10mL GADAVIST  GADOBUTROL  1 MMOL/ML IV SOLN COMPARISON:  X-ray 09/14/2023 FINDINGS: Bones/Joint/Cartilage Bone marrow edema and enhancement within the distal phalanx of the third toe (series 8, image 12). T1 marrow signal is intermediate at this location. No additional sites of marrow replacement. Severe osteoarthritis of the first MTP joint with complete joint space loss and subchondral cystic changes. There is marrow edema within the great toe proximal phalanx and within the distal aspect of the first metatarsal, likely degenerative/reactive or stress related. Prominent degenerative changes are also present at this second through fourth tarsometatarsal joints. No fracture or dislocation. Ligaments Intact Lisfranc ligaments.  No collateral ligament injury. Muscles and Tendons Flexor tenosynovitis of the third toe distally. Diffuse muscle atrophy, likely related to denervation. Soft tissues Soft tissue swelling and edema of the third toe. Mild subcutaneous edema of the dorsum of the foot. No organized fluid collections. IMPRESSION: 1. Bone marrow edema and enhancement within the distal phalanx of the third toe, suspicious for early acute osteomyelitis. 2. Flexor tenosynovitis of the third toe distally. 3. Severe osteoarthritis of the first MTP joint. Marrow edema within the great toe proximal phalanx and within the distal aspect of the first metatarsal, likely degenerative/reactive or stress related. 4. Prominent degenerative  changes at the second through fourth tarsometatarsal joints. These results will be called to the ordering clinician or representative by the Radiologist Assistant, and communication documented in the PACS or [employer]. Electronically Signed   By: Leverne Reading D.O.   On: 10/24/2023 15:16   VAS US  ABI WITH/WO TBI Result Date: 10/06/2023  LOWER EXTREMITY DOPPLER STUDY Patient Name:  GREGOIRE BENNIS.  Date of Exam:   10/05/2023 Medical Rec #: [id number]           Accession #:  [id number] Date of Birth: 1960/12/03          Patient Gender: M Patient Age:   78 years Exam Location:  [address] Procedure:      VAS US  ABI WITH/WO TBI Referring Phys: JAKE SEMON --------------------------------------------------------------------------------  Indications: Skin ulcer of third toe of right foot with fat layer exposed High Risk Factors: Hypertension, hyperlipidemia, Diabetes, prior CVA.  Comparison Study: 08/12/22 Performing Technologist: Aloma Arrant RVS  Examination Guidelines: A complete evaluation includes at minimum, Doppler waveform signals and systolic blood pressure reading at the level of bilateral brachial, anterior tibial, and posterior tibial arteries, when vessel segments are accessible. Bilateral testing is considered an integral part of a complete examination. Photoelectric Plethysmograph (PPG) waveforms and toe systolic pressure readings are included as required and additional duplex testing as needed. Limited examinations for reoccurring indications may be performed as noted.  ABI Findings: +---------+------------------+-----+-----------+--------+ Right    Rt Pressure (mmHg)IndexWaveform   Comment  +---------+------------------+-----+-----------+--------+ Brachial 108                                        +---------+------------------+-----+-----------+--------+ PTA      159               1.31 multiphasic         +---------+------------------+-----+-----------+--------+ DP        176               1.45 multiphasic         +---------+------------------+-----+-----------+--------+ Great Toe40                0.33                     +---------+------------------+-----+-----------+--------+ +---------+------------------+-----+-----------+-------+ Left     Lt Pressure (mmHg)IndexWaveform   Comment +---------+------------------+-----+-----------+-------+ Brachial 121                                       +---------+------------------+-----+-----------+-------+ PTA      156               1.29 multiphasic        +---------+------------------+-----+-----------+-------+ DP       158               1.31 multiphasic        +---------+------------------+-----+-----------+-------+ Great Toe71                0.59                    +---------+------------------+-----+-----------+-------+ +-------+-----------+-----------+------------+------------+ ABI/TBIToday's ABIToday's TBIPrevious ABIPrevious TBI +-------+-----------+-----------+------------+------------+ Right  1.45       0.33       1.28        0.47         +-------+-----------+-----------+------------+------------+ Left   1.31       0.59       1.24        0.59         +-------+-----------+-----------+------------+------------+  Summary: Right: Resting right ankle-brachial index indicates noncompressible right lower extremity arteries. The right toe-brachial index is abnormal. Left: Resting left ankle-brachial index indicates noncompressible left lower extremity arteries. The left toe-brachial index is abnormal. *See table(s) above for measurements and observations.  Electronically signed by Delaney Fearing on 10/06/2023 at 9:22:35 AM.  Final     Imaging per podiatry note Right foot 3 views weightbearing 09/14/2023 Joint spaces preserved.  On close review of imaging from February, there are signs of very mild osteolysis and cortical irregularities at the distal third toe.  This does  appear relatively stable from films from 3/20 and from 4/3  Assessment/Plan # Rt third toe osteomyelitis  - on prolonged course of PO doxycycline   Plan - PICC and IV antibiotics as in OPAT - continue doxycycline  100mg  po bid,  will add ciprofloxacin  750mg  po bid to be taken until OPAT set up. Last qtc 427 - Fu in 3-4 weeks, likely transition to PO in 4 weeks.  # DM  - discussed BG control  - Fu with PCP  # PVD  - ABI abnormal  - Fu with Vascular/Podiatry. Defer to podiatry if needs Vascular referral  # Penicillin allergy  - Reports h/o rashes almost 45 years ago  - Fu with allergy   # Morbid Obesity  - will benefit from weight loss   I spent 25 minutes involved in face-to-face and non-face-to-face activities for this patient on the day of the visit. Professional time spent includes the following activities: Preparing to see the patient (review of tests), Obtaining and reviewing separately obtained history (Podiatry notes), Performing a medically appropriate examination and evaluation , Ordering medications/PICC,  Documenting clinical information in the EMR, Independently interpreting results (not separately reported), Communicating results to the patient/famil, Counseling and educating the patient/family and Care coordination (not separately reported).   Of note, portions of this note may have been created with voice recognition software. While this note has been edited for accuracy, occasional wrong-word or 'sound-a-like' substitutions may have occurred due to the inherent limitations of voice recognition software.   Melvina Stage, MD [facility] for Infectious Disease [facility] 10/27/2023, 11:53 AM OPAT  Diagnosis: Rt 3rd toe osteomyelitis   Culture Result: no cultures   Allergies  Allergen Reactions   Cashew Nut Oil Anaphylaxis    Cashews, 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   Pistachio Nut (Diagnostic) Hives, Shortness Of Breath, Itching, Swelling and Rash    Latex Dermatitis   Tape Other (See Comments)    Tears and bruises the skin   Betadine [Povidone Iodine] Rash and Other (See Comments)    Raised rash 04/23/14   Lidocaine  Other (See Comments)    Minimal effect with lidocaine , prefers different anesthetic   Penicillins Rash and Other (See Comments)    Rash all over as a child    OPAT Orders Discharge antibiotics to be given via PICC line Discharge antibiotics: daptomycin 800mg  iv daily and ciprofloxacin  750 nmg po bid  Per pharmacy protocol   Duration: 6 weeks   End Date: 6 weeks from start date tentatively  [facility] Care Per Protocol:  Home health RN for IV administration and teaching; PICC line care and labs.    Labs weekly while on IV antibiotics: X__ CBC with differential __ BMP X__ CMP X__ CRP X__ ESR __ Vancomycin  trough __ CK  X__ Please pull PIC at completion of IV antibiotics __ Please leave PIC in place until doctor has seen patient or been notified  Fax weekly labs to [phone]  Clinic Follow Up Appt: 4 weeks   Terre Ferri, MD Infectious Disease Physician [facility] for Infectious Disease [address] [place], [place] [address] Phone: [phone]  Fax: [phone]

## 2023-10-25 NOTE — Patient Instructions (Signed)
 Smoking Cessation: QuitlineNC 1-800-QUIT-NOW [phone]); Espaol: 1-855-Djelo-Ya ([phone]) [email or web address]

## 2023-10-25 NOTE — Telephone Encounter (Signed)
 PICC Placement scheduled for 11/01/23 11 AM at [facility]. Patient aware.  Alerted [facility] and [facility] Team.  First Dose of Dapto will be given in the home per Pam.

## 2023-10-27 ENCOUNTER — Ambulatory Visit (INDEPENDENT_AMBULATORY_CARE_PROVIDER_SITE_OTHER): Admitting: Podiatry

## 2023-10-27 ENCOUNTER — Ambulatory Visit: Admitting: Cardiology

## 2023-10-27 ENCOUNTER — Other Ambulatory Visit: Payer: Self-pay | Admitting: Family Medicine

## 2023-10-27 ENCOUNTER — Ambulatory Visit: Admitting: Podiatry

## 2023-10-27 ENCOUNTER — Other Ambulatory Visit: Payer: Self-pay

## 2023-10-27 DIAGNOSIS — M869 Osteomyelitis, unspecified: Secondary | ICD-10-CM | POA: Diagnosis not present

## 2023-10-27 DIAGNOSIS — L97512 Non-pressure chronic ulcer of other part of right foot with fat layer exposed: Secondary | ICD-10-CM | POA: Diagnosis not present

## 2023-10-27 NOTE — Progress Notes (Signed)
 Chief Complaint  Patient presents with   Wound Check    Rm17/ follow up ulcer check right third toe / diabetic A1c 6.5/ starts IV antibiotics next week.  Date of procedure: 08/17/2023 Procedure: Right third toe flexor tenotomy  HPI: 63 y.o. male presents today status post right third toe flexor tenotomy and for treatment of ulceration to distal third toe.  He is accompanied by daughter, they have been keeping up with wound care.  MRI resulted which is suggestive of osteomyelitis to distal tuft of the third toe right foot.  Area of ulceration has callused over.  He has plans for PICC line to be placed.  Start IV antibiotics next week.  Past Medical History:  Diagnosis Date   Angio-edema    Anxiety    Claudication in peripheral vascular disease (HCC) 05/07/2022   Depression    Diabetes mellitus type II    Diabetic polyneuropathy associated with type 2 diabetes mellitus (HCC) 06/30/2018   GERD (gastroesophageal reflux disease)    History of kidney stones    HLD (hyperlipidemia)    HTN (hypertension)    Morbid obesity (HCC)    PAF (paroxysmal atrial fibrillation) (HCC) 08/31/2020   Paroxysmal atrial fibrillation (HCC)    Restless legs syndrome (RLS) 11/20/2012   Stroke (HCC) 02/14/2020   ischemic/right sided affected   Tobacco abuse     Past Surgical History:  Procedure Laterality Date   Arm surgery Right 1969   Abscess excision and debridement at elbow   COLONOSCOPY WITH PROPOFOL  N/A 09/06/2016   Procedure: COLONOSCOPY WITH PROPOFOL ;  Surgeon: Albertina Hugger, MD;  Location: [facility];  Service: Gastroenterology;  Laterality: N/A;   I & D EXTREMITY Left 05/29/2014   Procedure: IRRIGATION AND DEBRIDEMENT OF LEFT LEG WOUND AND PLACEMENT OF  INTEGRA  AND  VAC;  Surgeon: Marilou Showman, DO;  Location: [facility];  Service: Plastics;  Laterality: Left;   implantable loop recorder implant  03/06/2020   Medtronic Reveal [place] model LNQ11 (SN  [id number])  implanted by Dr Nunzio Belch for cryptogenic stroke   INCISION AND DRAINAGE OF WOUND Left 04/23/2014   Procedure: IRRIGATION AND DEBRIDEMENT LOWER LEFT LEG WOUND WITH PLACEMENT OF INTEGRA AND VAC;  Surgeon: Marilou Showman, DO;  Location: [facility];  Service: Plastics;  Laterality: Left;   kidney stone removal  2004   MINOR APPLICATION OF WOUND VAC Left 04/23/2014   Procedure: MINOR APPLICATION OF WOUND VAC;  Surgeon: Marilou Showman, DO;  Location: [facility];  Service: Plastics;  Laterality: Left;   SKIN SPLIT GRAFT Left 06/18/2014   Procedure: SKIN GRAFT SPLIT THICKNESS TO LOWER LEFT LEG WOUND WITH PLACEMENT OF VAC;  Surgeon: Marilou Showman, DO;  Location:  SURGERY CENTER;  Service: Plastics;  Laterality: Left;   VASECTOMY  1994    Allergies  Allergen Reactions   Cashew Nut Oil Anaphylaxis    Cashews, 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   Pistachio Nut (Diagnostic) Hives, Shortness Of Breath, Itching, Swelling and Rash   Latex Dermatitis   Tape Other (See Comments)    Tears and bruises the skin   Betadine [Povidone Iodine] Rash and Other (See Comments)    Raised rash 04/23/14   Lidocaine  Other (See Comments)    Minimal effect with lidocaine , prefers different anesthetic   Penicillins Rash and Other (See Comments)    Rash all over as a child    ROS denies any nausea, vomiting, fever, chills, chest pain,  shortness of breath   Physical Exam: There were no vitals filed for this visit.  General: The patient is alert and oriented x3 in no acute distress.  Dermatology: Loosely adhered callus distal aspect of right third toe, underlying ulceration present. Following debridement this measures about 0.2 x 0.3 x 0.2 with fat exposed.  Minimal probing.  Does not probe deep today.  Minimal drainage.  No significant erythema or edema to the toe.  Vascular: Faintly palpable pedal pulses bilaterally. Capillary refill within normal limits.  No diffuse edema.  Decreased edema to  the right third toe.  No erythema.  No warmth increased.  Diminished pedal hair growth.  Neurological: Protective sensation decreased  Musculoskeletal Exam: Improved alignment of right third toe, decrease of flexor contracture.  Charcot foot deformity noted.  Clawing of the left foot digits noted during gait as well  Radiographic Exam: Right foot MRI 10/06/2023 IMPRESSION: 1. Bone marrow edema and enhancement within the distal phalanx of the third toe, suspicious for early acute osteomyelitis. 2. Flexor tenosynovitis of the third toe distally. 3. Severe osteoarthritis of the first MTP joint. Marrow edema within the great toe proximal phalanx and within the distal aspect of the first metatarsal, likely degenerative/reactive or stress related. 4. Prominent degenerative changes at the second through fourth tarsometatarsal joints.   These results will be called to the ordering clinician or representative by the Radiologist Assistant, and communication documented in the PACS or [employer].     Electronically Signed   By: Leverne Reading D.O.   On: 10/24/2023 15:16    Assessment/Plan of Care: 1. 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 Skin ulcer of third toe of right foot with fat layer exposed (HCC)      No orders of the defined types were placed in this encounter.  None  Discussed clinical findings with patient today.  Excisional debridement of the ulceration performed today using 312 scalpel blade of nonviable skin and subcutaneous tissue down to patient tolerance.  Postdebridement measurement/described above.  Mupirocin  and bandage applied today.  Reviewed daily dressing changes with patient  Reviewed MRI results with patient.  Did discuss conservative versus surgical treatment of the likely osteomyelitis.  He would like to continue with long-term antibiotics.  He is getting PICC line placed next week and starting IV antibiotics.  Understands he may need partial  amputation if condition of the toe worsens however this does appear improved from previous today.  Understands that we will also need to see how he responds once he completes antibiotic course.  Infectious disease planning allergy panel to determine antibiotics.  Follow-up in 2 to 3 weeks  Mikiala Fugett L. Lunda Salines, AACFAS [facility]     [address] [place], [place] [address]                Office [phone]  Fax [phone]

## 2023-10-28 ENCOUNTER — Other Ambulatory Visit: Payer: Self-pay

## 2023-10-28 ENCOUNTER — Other Ambulatory Visit (HOSPITAL_BASED_OUTPATIENT_CLINIC_OR_DEPARTMENT_OTHER): Payer: Self-pay

## 2023-10-28 ENCOUNTER — Other Ambulatory Visit (HOSPITAL_COMMUNITY): Payer: Self-pay

## 2023-10-28 MED ORDER — LANCET DEVICE MISC
1.0000 | Freq: Three times a day (TID) | 0 refills | Status: DC
  Filled 2023-10-28: qty 1, 30d supply, fill #0

## 2023-10-30 ENCOUNTER — Other Ambulatory Visit (HOSPITAL_COMMUNITY): Payer: Self-pay

## 2023-10-30 ENCOUNTER — Encounter: Payer: Self-pay | Admitting: Podiatry

## 2023-10-30 DIAGNOSIS — H5203 Hypermetropia, bilateral: Secondary | ICD-10-CM | POA: Diagnosis not present

## 2023-10-30 DIAGNOSIS — H53461 Homonymous bilateral field defects, right side: Secondary | ICD-10-CM | POA: Diagnosis not present

## 2023-10-30 DIAGNOSIS — E119 Type 2 diabetes mellitus without complications: Secondary | ICD-10-CM | POA: Diagnosis not present

## 2023-10-30 DIAGNOSIS — H52223 Regular astigmatism, bilateral: Secondary | ICD-10-CM | POA: Diagnosis not present

## 2023-10-30 DIAGNOSIS — H524 Presbyopia: Secondary | ICD-10-CM | POA: Diagnosis not present

## 2023-10-30 LAB — HM DIABETES EYE EXAM

## 2023-11-01 ENCOUNTER — Encounter: Payer: Self-pay | Admitting: Family Medicine

## 2023-11-01 ENCOUNTER — Other Ambulatory Visit (HOSPITAL_COMMUNITY): Payer: Self-pay

## 2023-11-01 ENCOUNTER — Other Ambulatory Visit: Payer: Self-pay | Admitting: Family Medicine

## 2023-11-01 ENCOUNTER — Ambulatory Visit (HOSPITAL_COMMUNITY)
Admission: RE | Admit: 2023-11-01 | Discharge: 2023-11-01 | Disposition: A | Discharge status: Home / Self Care | Source: Ambulatory Visit | Attending: Infectious Diseases | Admitting: Infectious Diseases

## 2023-11-01 DIAGNOSIS — M869 Osteomyelitis, unspecified: Secondary | ICD-10-CM | POA: Diagnosis not present

## 2023-11-01 DIAGNOSIS — L97519 Non-pressure chronic ulcer of other part of right foot with unspecified severity: Secondary | ICD-10-CM | POA: Diagnosis not present

## 2023-11-01 MED ORDER — HEPARIN SOD (PORK) LOCK FLUSH 100 UNIT/ML IV SOLN: 500.0000 [IU] | Freq: Once | INTRAVENOUS | Status: DC

## 2023-11-01 MED ORDER — HEPARIN SOD (PORK) LOCK FLUSH 100 UNIT/ML IV SOLN
INTRAVENOUS | Status: AC
  Filled 2023-11-01: qty 5

## 2023-11-01 MED ORDER — TETRACAINE HCL 1 % IJ SOLN
INTRAMUSCULAR | Status: AC
  Filled 2023-11-01: qty 2

## 2023-11-01 MED ORDER — TETRACAINE HCL 1 % IJ SOLN
5.0000 mg | Freq: Once | INTRAMUSCULAR | Status: AC
  Administered 2023-11-01: 4 mL

## 2023-11-01 NOTE — Procedures (Signed)
 PROCEDURE SUMMARY:  Successful placement of single lumen PICC line to right basilic vein. Length 44 cm Tip at lower SVC/RA No complications PICC capped Ready for use. EBL = trace  Please see full dictation in Imaging section for details.   Damian Duke Quinterious Walraven PA-C 11/01/2023 11:45 AM

## 2023-11-02 DIAGNOSIS — Z7901 Long term (current) use of anticoagulants: Secondary | ICD-10-CM | POA: Diagnosis not present

## 2023-11-02 DIAGNOSIS — Z7984 Long term (current) use of oral hypoglycemic drugs: Secondary | ICD-10-CM | POA: Diagnosis not present

## 2023-11-02 DIAGNOSIS — Z9181 History of falling: Secondary | ICD-10-CM | POA: Diagnosis not present

## 2023-11-02 DIAGNOSIS — M869 Osteomyelitis, unspecified: Secondary | ICD-10-CM | POA: Diagnosis not present

## 2023-11-02 DIAGNOSIS — Z7985 Long-term (current) use of injectable non-insulin antidiabetic drugs: Secondary | ICD-10-CM | POA: Diagnosis not present

## 2023-11-02 DIAGNOSIS — I10 Essential (primary) hypertension: Secondary | ICD-10-CM | POA: Diagnosis not present

## 2023-11-02 DIAGNOSIS — Z452 Encounter for adjustment and management of vascular access device: Secondary | ICD-10-CM | POA: Diagnosis not present

## 2023-11-02 DIAGNOSIS — E1169 Type 2 diabetes mellitus with other specified complication: Secondary | ICD-10-CM | POA: Diagnosis not present

## 2023-11-02 DIAGNOSIS — Z8673 Personal history of transient ischemic attack (TIA), and cerebral infarction without residual deficits: Secondary | ICD-10-CM | POA: Diagnosis not present

## 2023-11-02 DIAGNOSIS — I4891 Unspecified atrial fibrillation: Secondary | ICD-10-CM | POA: Diagnosis not present

## 2023-11-02 DIAGNOSIS — S90934A Unspecified superficial injury of right lesser toe(s), initial encounter: Secondary | ICD-10-CM | POA: Diagnosis not present

## 2023-11-02 DIAGNOSIS — Z792 Long term (current) use of antibiotics: Secondary | ICD-10-CM | POA: Diagnosis not present

## 2023-11-06 DIAGNOSIS — M869 Osteomyelitis, unspecified: Secondary | ICD-10-CM | POA: Diagnosis not present

## 2023-11-07 ENCOUNTER — Other Ambulatory Visit: Payer: Self-pay

## 2023-11-07 ENCOUNTER — Other Ambulatory Visit: Payer: Self-pay | Admitting: Family Medicine

## 2023-11-07 ENCOUNTER — Encounter: Payer: Self-pay | Admitting: Allergy & Immunology

## 2023-11-07 ENCOUNTER — Ambulatory Visit: Admitting: Allergy & Immunology

## 2023-11-07 ENCOUNTER — Other Ambulatory Visit (HOSPITAL_COMMUNITY): Payer: Self-pay

## 2023-11-07 VITALS — BP 100/62 | HR 89 | Temp 98.1°F | Resp 18 | Ht 69.69 in | Wt 290.6 lb

## 2023-11-07 DIAGNOSIS — Z91018 Allergy to other foods: Secondary | ICD-10-CM | POA: Diagnosis not present

## 2023-11-07 DIAGNOSIS — Z880 Allergy status to penicillin: Secondary | ICD-10-CM | POA: Diagnosis not present

## 2023-11-07 DIAGNOSIS — J310 Chronic rhinitis: Secondary | ICD-10-CM | POA: Diagnosis not present

## 2023-11-07 DIAGNOSIS — T360X5D Adverse effect of penicillins, subsequent encounter: Secondary | ICD-10-CM

## 2023-11-07 NOTE — Progress Notes (Unsigned)
 NEW PATIENT  Date of Service/Encounter:  11/07/23  Consult requested by: Scherrie Curt, MD   Assessment:   Penicillin allergy  Plan/Recommendations:   There are no Patient Instructions on file for this visit.   {Blank single:19197::"This note in its entirety was forwarded to the Provider who requested this consultation."}  Subjective:   [name] [name]. is a 63 y.o. male presenting today for evaluation of  Chief Complaint  Patient presents with   Frequent Infections    Pt referred to see if his body can take pencillin, pt has an infection in middle toe on right foot. Pt is on IV infusion daily.    [name] [name]. 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) 10/25/2023   Needs peripherally inserted central catheter (PICC) 10/25/2023   Penicillin allergy 10/04/2023   PVD (peripheral vascular disease) (HCC) 10/04/2023   Diabetic foot infection (HCC) 10/03/2023   Osteomyelitis (HCC) 10/03/2023   Medication management 10/03/2023   Skin ulcer of third toe (HCC) 07/05/2023   Claudication in peripheral vascular disease (HCC) 05/07/2022   PAF (paroxysmal atrial fibrillation) (HCC) 08/31/2020   Panic attacks 06/23/2020   Aortic calcification (HCC) 02/22/2020   Carotid artery calcification, bilateral 02/22/2020   MDD (major depressive disorder), recurrent episode (HCC)    GERD (gastroesophageal reflux disease)    Ischemic stroke (HCC), occipital    Diabetic polyneuropathy associated with type 2 diabetes mellitus (HCC) 06/30/2018   Erectile dysfunction due to type 2 diabetes mellitus (HCC) 06/30/2018   Circulation disorder of lower extremity 05/31/2015   IBS (irritable bowel syndrome) 12/24/2014   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  Discussed the use of AI scribe software for clinical note transcription with the patient and/or guardian, who gave verbal consent to proceed.  [name] [name]. was referred by Scherrie Curt, MD.     Ezri is a 63 y.o. male presenting for {Blank single:19197::"a food challenge","a drug challenge","skin testing","a sick visit","an evaluation of ***","a follow up visit"}.      Asthma/Respiratory Symptom History: ***  Allergic Rhinitis Symptom History: ***  Food Allergy Symptom History: ***  Skin Symptom History: ***  GERD Symptom History: ***  Infection Symptom History: ***  ***Otherwise, there is no history of other atopic diseases, including {Blank multiple:19196:o:"asthma","food allergies","drug allergies","environmental allergies","stinging insect allergies","eczema","urticaria","contact dermatitis"}. There is no significant infectious history. ***Vaccinations are up to date.    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) 10/25/2023   Needs peripherally inserted central catheter (PICC) 10/25/2023   Penicillin allergy 10/04/2023   PVD (peripheral vascular disease) (HCC) 10/04/2023   Diabetic foot infection (HCC) 10/03/2023   Osteomyelitis (HCC) 10/03/2023   Medication management 10/03/2023   Skin ulcer of third toe (HCC) 07/05/2023   Claudication in peripheral vascular disease (HCC) 05/07/2022   PAF (paroxysmal atrial fibrillation) (HCC) 08/31/2020   Panic attacks 06/23/2020   Aortic calcification (HCC) 02/22/2020  Carotid artery calcification, bilateral 02/22/2020   MDD (major depressive  disorder), recurrent episode (HCC)    GERD (gastroesophageal reflux disease)    Ischemic stroke (HCC), occipital    Diabetic polyneuropathy associated with type 2 diabetes mellitus (HCC) 06/30/2018   Erectile dysfunction due to type 2 diabetes mellitus (HCC) 06/30/2018   Circulation disorder of lower extremity 05/31/2015   IBS (irritable bowel syndrome) 12/24/2014   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   Pistachio Nut (diagnostic) Hives, Shortness Of Breath, Itching, Swelling, Rash   Latex Dermatitis   Tape Other (See Comments)   Tears and bruises the skin   Betadine [povidone Iodine] Rash, Other (See Comments)   Raised rash 04/23/14   Lidocaine  Other (See Comments)   Minimal effect with lidocaine , prefers different anesthetic   Penicillins Rash, Other (See Comments)   Rash all over as a child        Medication List        Accurate as of November 07, 2023  1:41 PM. If you have any questions, ask your nurse or doctor.          Acetaminophen  Extra Strength 500 MG Tabs Take 2 tablets (1,000 mg total) by mouth 2 (two) times daily as needed (for pain).   buPROPion  150 MG 24 hr tablet Commonly known as: WELLBUTRIN  XL Take 1 tablet (150 mg total) by mouth daily.   cetirizine 10 MG tablet Commonly known as: ZYRTEC Take 10 mg by mouth daily.   ciprofloxacin  750 MG tablet Commonly known as: CIPRO  Take 1 tablet (750 mg total) by mouth 2 (two) times daily.   clonazePAM  0.5 MG tablet Commonly  known as: KLONOPIN  Take 1 tablet (0.5 mg total) by mouth 2 (two) times daily.   Comirnaty syringe Generic drug: COVID-19 mRNA vaccine (Pfizer) Inject into the muscle.   Daily-Vite Multivitamin Tabs Take 1 tablet by mouth daily.   diltiazem  30 MG tablet Commonly known as: CARDIZEM  TAKE 1 TABLET BY MOUTH EVERY 4 HOURS AS NEEDED FOR HEART RATE>100.   doxycycline  100 MG tablet Commonly known as: VIBRA -TABS Take 1 tablet (100 mg total) by mouth 2 (two) times daily for 14 days.   DULoxetine  30 MG capsule Commonly known as: CYMBALTA  Take 2 capsules (60 mg total) by mouth daily AND 1 capsule (30 mg total) at bedtime.   DULoxetine  60 MG capsule Commonly known as: CYMBALTA  Take 1 capsule (60 mg total) by mouth 2 (two) times daily.   Easy Touch Alcohol  Prep Medium 70 % Pads Use to check blood sugar daily   EPINEPHrine  0.3 mg/0.3 mL Soaj injection Commonly known as: EPI-PEN Inject 0.3 mg into the muscle as needed for anaphylaxis.   FLUoxetine  40 MG capsule Commonly known as: PROZAC  Take 2 capsules (80 mg total) by mouth daily.   hydrochlorothiazide  25 MG tablet Commonly known as: HYDRODIURIL  Take 1 tablet (25 mg total) by mouth daily.   hydrOXYzine 10 MG tablet Commonly known as: ATARAX Take 10 mg by  mouth 2 (two) times daily as needed.   Jardiance  25 MG Tabs tablet Generic drug: empagliflozin  Take 1 tablet (25 mg total) by mouth daily before breakfast.   LIBREXIA-AF apixaban  or placebo 5 mg Caps capsule Take 1 capsule (5 mg total) by mouth 2 (two) times daily. Take at approximately the same time of day with or without food. Please bring bottle back with you to every visit; do not discard bottle. Please contact [facility] for any questions or concerns regarding this medication. For Investigational Use Only.   LIBREXIA-AF [id number] (Milvexian) or placebo 100 mg tablet Take 1 tablet by mouth 2 (two) times daily. Take at approximately the same time of day  with or without food. Please bring bottle back with you to every visit; do not discard bottle. Please contact [facility] for any questions or concerns regarding this medication. For Investigational Use Only.   lisinopril  10 MG tablet Commonly known as: ZESTRIL  Take 1 tablet (10 mg total) by mouth daily.   metFORMIN  500 MG 24 hr tablet Commonly known as: GLUCOPHAGE -XR Take 4 tablets (2,000 mg total) by mouth daily in the afternoon.   mupirocin  ointment 2 % Commonly known as: BACTROBAN  Apply 1 Application topically 2 (two) times daily.   ONE TOUCH ULTRA 2 w/Device Kit Use as directed once daily.   OneTouch Delica Plus Lancet33G Misc Use to check blood sugar 2 times a day   OneTouch Delica Plus Lancet33G Misc Use as directed in the morning, at noon, and at bedtime.   OneTouch Delica Plus Lancing Misc Use as directed in the morning, at noon, and at bedtime.   OneTouch Ultra Test test strip Generic drug: glucose blood Use as directed once daily.   oxyCODONE -acetaminophen  5-325 MG tablet Commonly known as: PERCOCET/ROXICET Take 1 tablet by mouth every 6 (six) hours as needed for up to 12 doses for severe pain (pain score 7-10).   pantoprazole  40 MG tablet Commonly known as: PROTONIX  Take 1 tablet (40 mg total) by mouth daily.   pentoxifylline  400 MG CR tablet Commonly known as: TRENTAL  Take 1 tablet (400 mg total) by mouth 3 (three) times daily with meals.   pregabalin  300 MG capsule Commonly known as: LYRICA  TAKE 1 CAPSULE(300 MG) BY MOUTH TWICE DAILY   repaglinide  1 MG tablet Commonly known as: PRANDIN  Take 0.5 tablets (0.5 mg total) by mouth 2 (two) times daily before a meal.   rOPINIRole  0.5 MG tablet Commonly known as: REQUIP  Take 1 tablet (0.5 mg total) by mouth 2 (two) times daily.   rosuvastatin  20 MG tablet Commonly known as: CRESTOR  Take 1 tablet (20 mg total) by mouth at bedtime.   triamcinolone  cream 0.1 % Commonly known as:  KENALOG  Apply 1 Application topically 2 (two) times daily.   Trulicity  4.5 MG/0.5ML Soaj Generic drug: Dulaglutide  Inject 4.5 mg into the skin once a week.        Birth History: {Blank single:19197::"non-contributory","born premature and spent time in the NICU","born at term without complications"}  Developmental History: Vander has met all milestones on time. He has required no {Blank multiple:19196:a:"speech therapy","occupational therapy","physical therapy"}. ***non-contributory  Past Surgical History: Past Surgical History:  Procedure Laterality Date   Arm surgery Right 1969   Abscess excision and debridement at elbow   COLONOSCOPY WITH PROPOFOL  N/A 09/06/2016   Procedure: COLONOSCOPY WITH PROPOFOL ;  Surgeon: Albertina Hugger, MD;  Location: [facility];  Service: Gastroenterology;  Laterality: N/A;   I & D EXTREMITY Left 05/29/2014  Procedure: IRRIGATION AND DEBRIDEMENT OF LEFT LEG WOUND AND PLACEMENT OF  INTEGRA  AND  VAC;  Surgeon: Marilou Showman, DO;  Location: [facility];  Service: Plastics;  Laterality: Left;   implantable loop recorder implant  03/06/2020   Medtronic Reveal [place] model LNQ11 (SN  [id number]) implanted by Dr Nunzio Belch for cryptogenic stroke   INCISION AND DRAINAGE OF WOUND Left 04/23/2014   Procedure: IRRIGATION AND DEBRIDEMENT LOWER LEFT LEG WOUND WITH PLACEMENT OF INTEGRA AND VAC;  Surgeon: Marilou Showman, DO;  Location: [facility];  Service: Plastics;  Laterality: Left;   kidney stone removal  2004   MINOR APPLICATION OF WOUND VAC Left 04/23/2014   Procedure: MINOR APPLICATION OF WOUND VAC;  Surgeon: Marilou Showman, DO;  Location: [facility];  Service: Plastics;  Laterality: Left;   SKIN SPLIT GRAFT Left 06/18/2014   Procedure: SKIN GRAFT SPLIT THICKNESS TO LOWER LEFT LEG WOUND WITH PLACEMENT OF VAC;  Surgeon: Marilou Showman, DO;  Location: [facility];  Service: Plastics;  Laterality: Left;    VASECTOMY  1994     Family History: Family History  Problem Relation Age of Onset   Depression Mother    Anxiety disorder Mother    Stroke Mother        multiple   Leukemia Mother    Pneumonia Mother    Cancer Mother    Diabetes Mother    Diabetes Father    Heart disease Father    Hyperlipidemia Father    Hypertension Father    Allergic rhinitis Neg Hx    Angioedema Neg Hx    Asthma Neg Hx    Eczema Neg Hx    Immunodeficiency Neg Hx    Urticaria Neg Hx      Social History: Raequan lives at home with ***.    Review of systems otherwise negative other than that mentioned in the HPI.    Objective:   There were no vitals taken for this visit. There is no height or weight on file to calculate BMI.     Physical Exam   Diagnostic studies: {Blank single:19197::"none","deferred due to recent antihistamine use","deferred due to insurance stipulations that require a separate visit for testing","labs sent instead"," "}  Spirometry: {Blank single:19197::"results normal (FEV1: ***%, FVC: ***%, FEV1/FVC: ***%)","results abnormal (FEV1: ***%, FVC: ***%, FEV1/FVC: ***%)"}.    {Blank single:19197::"Spirometry consistent with mild obstructive disease","Spirometry consistent with moderate obstructive disease","Spirometry consistent with severe obstructive disease","Spirometry consistent with possible restrictive disease","Spirometry consistent with mixed obstructive and restrictive disease","Spirometry uninterpretable due to technique","Spirometry consistent with normal pattern"}. {Blank single:19197::"Albuterol /Atrovent nebulizer","Xopenex/Atrovent nebulizer","Albuterol  nebulizer","Albuterol  four puffs via MDI","Xopenex four puffs via MDI"} treatment given in clinic with {Blank single:19197::"significant improvement in FEV1 per ATS criteria","significant improvement in FVC per ATS criteria","significant improvement in FEV1 and FVC per ATS criteria","improvement in FEV1, but not  significant per ATS criteria","improvement in FVC, but not significant per ATS criteria","improvement in FEV1 and FVC, but not significant per ATS criteria","no improvement"}.  Allergy Studies: {Blank single:19197::"none","deferred due to recent antihistamine use","deferred due to insurance stipulations that require a separate visit for testing","labs sent instead"," "}    {Blank single:19197::"Allergy testing results were read and interpreted by myself, documented by clinical staff."," "}         Drexel Gentles, MD [facility] 

## 2023-11-07 NOTE — Patient Instructions (Addendum)
 1. Penicillin adverse reaction - I think you are low risk given your history.  - We know that 90% of paitents who are allergic to penicillin LOSE the allergy after ten years. - So since your history is low risk (no hospitalizations, throat swelling, etc), I think we can skip hte skin testing and then just do a challenge in the office instead.  - We will give you 1% of the dose, wait 30 minutes, then give 10% of the dose, with 30 minutes, and then give the rest of the dose and wait one hour.   2. Tree nut allergy (cashew) - We will get a tree nut panel today in the office. - EpiPen  is up to date.  3. Chronic rhinitis  - we will get environmental allergy testing via the blood since we are getting blood.   4. Return in about 4 weeks (around 12/05/2023). You can have the follow up appointment with Dr. Idolina Maker or a Nurse Practicioner (our Nurse Practitioners are excellent and always have Physician oversight!).    Please inform us  of any Emergency Department visits, hospitalizations, or changes in symptoms. Call us  before going to the ED for breathing or allergy symptoms since we might be able to fit you in for a sick visit. Feel free to contact us  anytime with any questions, problems, or concerns.  It was a pleasure to meet you and your family today!  Websites that have reliable patient information: 1. American Academy of Asthma, Allergy, and Immunology: www.aaaai.org 2. Food Allergy Research and Education (FARE): foodallergy.org 3. Mothers of Asthmatics: http://www.asthmacommunitynetwork.org 4. American College of Allergy, Asthma, and Immunology: www.acaai.org      "Like" us  on Facebook and Instagram for our latest updates!      A healthy democracy works best when [employer] participate! Make sure you are registered to vote! If you have moved or changed any of your contact information, you will need to get this updated before voting! Scan the QR codes below to learn more!

## 2023-11-08 ENCOUNTER — Other Ambulatory Visit: Payer: Self-pay | Admitting: Podiatry

## 2023-11-08 DIAGNOSIS — M2041 Other hammer toe(s) (acquired), right foot: Secondary | ICD-10-CM

## 2023-11-09 ENCOUNTER — Encounter: Payer: Self-pay | Admitting: Allergy & Immunology

## 2023-11-10 ENCOUNTER — Ambulatory Visit: Payer: Self-pay | Admitting: Allergy & Immunology

## 2023-11-11 DIAGNOSIS — M869 Osteomyelitis, unspecified: Secondary | ICD-10-CM | POA: Diagnosis not present

## 2023-11-13 ENCOUNTER — Other Ambulatory Visit: Payer: Self-pay

## 2023-11-13 ENCOUNTER — Ambulatory Visit: Admitting: Cardiovascular Disease

## 2023-11-14 DIAGNOSIS — E1169 Type 2 diabetes mellitus with other specified complication: Secondary | ICD-10-CM | POA: Diagnosis not present

## 2023-11-15 ENCOUNTER — Other Ambulatory Visit: Payer: Self-pay | Admitting: Infectious Diseases

## 2023-11-16 ENCOUNTER — Ambulatory Visit: Admitting: Allergy & Immunology

## 2023-11-16 ENCOUNTER — Telehealth: Payer: Self-pay

## 2023-11-16 ENCOUNTER — Other Ambulatory Visit (HOSPITAL_COMMUNITY): Payer: Self-pay

## 2023-11-16 DIAGNOSIS — Z880 Allergy status to penicillin: Secondary | ICD-10-CM

## 2023-11-16 DIAGNOSIS — T360X5D Adverse effect of penicillins, subsequent encounter: Secondary | ICD-10-CM

## 2023-11-16 NOTE — Telephone Encounter (Signed)
 Call LVM to inform patient that he did not sign consent form for challenge and advise him to give a call back and ask to return to office to fill out forms.

## 2023-11-16 NOTE — Telephone Encounter (Signed)
 Patient seen allergist today and found out he is not allergic to penicillin. Please see OV from Allergist.  Patient wife would like to know how to proceed with IV abx he has his last dose of dapto tomorrow and then will be out of IV abx. She states you discussed switching the IV abx if not allergic to penicillin  Please advise.  Milka Windholz Adel Holt, CMA

## 2023-11-16 NOTE — Progress Notes (Unsigned)
 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 Penicillin adverse reaction - You passed your challenge today. - You can safely use penicillins now at this point.   2. Tree nut allergy (cashew) - Let's do a cashew challenge in the office.  - Make an appointment for that.   3. Chronic rhinitis (dust mites) - Avoidance measures provided.  4. Return in about 4 weeks (around 12/14/2023) for cashew challenge. You can have the follow up appointment with Dr. Idolina Maker or a Nurse Practicioner (our Nurse Practitioners are excellent and always have Physician oversight!).   Subjective:   [name] [name]. is a 63 y.o. male presenting today for follow up of No chief complaint on file.   [name] [name]. 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) 10/25/2023   Needs peripherally inserted central catheter (PICC) 10/25/2023   Penicillin allergy 10/04/2023   PVD (peripheral vascular disease) (HCC) 10/04/2023   Diabetic foot infection (HCC) 10/03/2023   Osteomyelitis (HCC) 10/03/2023   Medication management 10/03/2023   Skin ulcer of third toe (HCC) 07/05/2023   Claudication in peripheral vascular disease (HCC) 05/07/2022   PAF (paroxysmal atrial fibrillation) (HCC) 08/31/2020   Panic attacks 06/23/2020   Aortic calcification (HCC) 02/22/2020   Carotid artery calcification, bilateral 02/22/2020   MDD (major depressive disorder), recurrent episode (HCC)    GERD (gastroesophageal reflux disease)    Ischemic stroke (HCC), occipital    Diabetic polyneuropathy associated with type 2 diabetes mellitus (HCC) 06/30/2018   Erectile  dysfunction due to type 2 diabetes mellitus (HCC) 06/30/2018   Circulation disorder of lower extremity 05/31/2015   IBS (irritable bowel syndrome) 12/24/2014   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  Discussed the use of AI scribe software for clinical note transcription with the patient and/or guardian, who gave verbal consent to proceed.  [name] [name] is a 63 y.o. male presenting for a drug challenge. We last saw him last week for evaluation of a penicillin allergy. We felt that this was low risk, therefore we squeezed him in today for a challenge. He was in the midst of getting treated for osteomyeltis of the foot, so we wanted to get penicillins as a treatment option to help his Infectious Disease Team.   He has a history of anaphylactic reactions, which has led him to avoid tree nuts. Despite his fondness for cashews and pistachios, he has been avoiding them due to a previous allergic reaction. Recently, he received blood work results indicating a negative allergy panel for cashews. He currently possesses an EpiPen , which was initially prescribed due to his history of anaphylactic reactions. He is interested in getting a challenge done in the office so that he can  put cashews back in his life.   He has been off of antihistamines in anticipation of the challenge today.   Otherwise, there have been no changes to his past medical history, surgical history, family history, or social history.    Review of systems otherwise negative other than that mentioned in the  HPI.    Objective:   There were no vitals taken for this visit. There is no height or weight on file to calculate BMI.    Physical Exam Vitals reviewed.  Constitutional:      Appearance: He is well-developed. He is obese.     Comments: Pleasant.  Jovial.  HENT:     Head: Normocephalic and atraumatic.     Right Ear: Tympanic membrane, ear canal and external ear normal. No drainage, swelling or tenderness. Tympanic membrane is not injected, scarred, erythematous, retracted or bulging.     Left Ear: Tympanic membrane, ear canal and external ear normal. No drainage, swelling or tenderness. Tympanic membrane is not injected, scarred, erythematous, retracted or bulging.     Nose: No nasal deformity, septal deviation, mucosal edema or rhinorrhea.     Right Turbinates: Enlarged, swollen and pale.     Left Turbinates: Enlarged, swollen and pale.     Right Sinus: No maxillary sinus tenderness or frontal sinus tenderness.     Left Sinus: No maxillary sinus tenderness or frontal sinus tenderness.     Mouth/Throat:     Mouth: Mucous membranes are not pale and not dry.     Pharynx: Uvula midline.   Eyes:     General:        Right eye: No discharge.        Left eye: No discharge.     Conjunctiva/sclera: Conjunctivae normal.     Right eye: Right conjunctiva is not injected. No chemosis.    Left eye: Left conjunctiva is not injected. No chemosis.    Pupils: Pupils are equal, round, and reactive to light.    Cardiovascular:     Rate and Rhythm: Normal rate and regular rhythm.     Heart sounds: Normal heart sounds.  Pulmonary:     Effort: Pulmonary effort is normal. No tachypnea, accessory muscle usage or respiratory distress.     Breath sounds: Normal breath sounds. No decreased breath sounds, wheezing, rhonchi or rales.     Comments: No increased work of breathing. Chest:     Chest wall: No tenderness.  Lymphadenopathy:     Head:     Right side of head: No submandibular, tonsillar or  occipital adenopathy.     Left side of head: No submandibular, tonsillar or occipital adenopathy.     Cervical: No cervical adenopathy.   Skin:    Coloration: Skin is not pale.     Findings: No abrasion, erythema, petechiae or rash. Rash is not papular, urticarial or vesicular.   Neurological:     Mental Status: He is alert.   Psychiatric:        Behavior: Behavior is cooperative.      Diagnostic studies:     Allergy Studies:     Oral Challenge - 11/16/23 1700     BP 120/80    Pulse 87    Respirations 18    Lungs 96    Skin clear    Mouth clear    Time 1355    Dose 10mg     Skin clear    Mouth clear    Time 1411    Dose  100mg     Skin clear    Mouth clear    Time 1433    Dose 900mg     BP 110/70    Pulse 86    Respirations 20    Lungs 95    Skin clear    Mouth clear          His vitals were all within normal limits prior to discharge. He tolerated a full dose of amoxicillin today without any problems at all. This should allow him to tolerate anything in the penicillin family in the future. We will remove this from his allergy list.   Start time: 1:55 pm End time: 5:00 pm     Drexel Gentles, MD  [facility] 

## 2023-11-16 NOTE — Patient Instructions (Addendum)
 1. Penicillin adverse reaction - You passed your challenge today. - You can safely use penicillins now at this point.   2. Tree nut allergy (cashew) - Let's do a cashew challenge in the office.  - Make an appointment for that.   3. Chronic rhinitis (dust mites) - Avoidance measures provided.  4. Return in about 4 weeks (around 12/14/2023) for cashew challenge. You can have the follow up appointment with Dr. Idolina Maker or a Nurse Practicioner (our Nurse Practitioners are excellent and always have Physician oversight!).    Please inform us  of any Emergency Department visits, hospitalizations, or changes in symptoms. Call us  before going to the ED for breathing or allergy symptoms since we might be able to fit you in for a sick visit. Feel free to contact us  anytime with any questions, problems, or concerns.  It was a pleasure to see you and your family again today!  Websites that have reliable patient information: 1. American Academy of Asthma, Allergy, and Immunology: www.aaaai.org 2. Food Allergy Research and Education (FARE): foodallergy.org 3. Mothers of Asthmatics: http://www.asthmacommunitynetwork.org 4. American College of Allergy, Asthma, and Immunology: www.acaai.org      "Like" us  on Facebook and Instagram for our latest updates!      A healthy democracy works best when [employer] participate! Make sure you are registered to vote! If you have moved or changed any of your contact information, you will need to get this updated before voting! Scan the QR codes below to learn more!

## 2023-11-17 ENCOUNTER — Encounter: Payer: Self-pay | Admitting: Podiatry

## 2023-11-17 ENCOUNTER — Ambulatory Visit (INDEPENDENT_AMBULATORY_CARE_PROVIDER_SITE_OTHER): Admitting: Podiatry

## 2023-11-17 ENCOUNTER — Encounter: Payer: Self-pay | Admitting: Allergy & Immunology

## 2023-11-17 ENCOUNTER — Other Ambulatory Visit: Payer: Self-pay

## 2023-11-17 DIAGNOSIS — L97512 Non-pressure chronic ulcer of other part of right foot with fat layer exposed: Secondary | ICD-10-CM

## 2023-11-17 DIAGNOSIS — M869 Osteomyelitis, unspecified: Secondary | ICD-10-CM | POA: Diagnosis not present

## 2023-11-17 DIAGNOSIS — M2042 Other hammer toe(s) (acquired), left foot: Secondary | ICD-10-CM

## 2023-11-17 DIAGNOSIS — M2041 Other hammer toe(s) (acquired), right foot: Secondary | ICD-10-CM

## 2023-11-17 NOTE — Telephone Encounter (Signed)
 I spoke to patient wife advised her Dr. Gillian Lacrosse will continue the daptomycin and she will discuss it with him at his appointment on 11/28/23. I also spoke with [employer] and let them know the patient will continue daptomycin for now and ok to ship more out to the patient. I advised [employer] that the nurse with Dr. Gillian Lacrosse will follow up with then on 11/28/23 with any medication changes

## 2023-11-18 DIAGNOSIS — M869 Osteomyelitis, unspecified: Secondary | ICD-10-CM | POA: Diagnosis not present

## 2023-11-20 ENCOUNTER — Encounter (HOSPITAL_COMMUNITY): Payer: Self-pay

## 2023-11-20 DIAGNOSIS — M869 Osteomyelitis, unspecified: Secondary | ICD-10-CM | POA: Diagnosis not present

## 2023-11-20 NOTE — Progress Notes (Signed)
 Chief Complaint  Patient presents with   Wound Check    Rm9 Follow up wound right third toe/ doing well  Date of procedure: 08/17/2023 Procedure: Right third toe flexor tenotomy  HPI: 63 y.o. male presents today status post right third toe flexor tenotomy and for treatment of ulceration to distal third toe.  He is accompanied by daughter, they have been keeping up with wound care.  Currently he is continued IV antibiotics for right third toe osteomyelitis.  Does have concern of developing callus to the left 2nd and 3rd toes  Past Medical History:  Diagnosis Date   Angio-edema    Anxiety    Claudication in peripheral vascular disease (HCC) 05/07/2022   Depression    Diabetes mellitus type II    Diabetic polyneuropathy associated with type 2 diabetes mellitus (HCC) 06/30/2018   GERD (gastroesophageal reflux disease)    History of kidney stones    HLD (hyperlipidemia)    HTN (hypertension)    Morbid obesity (HCC)    PAF (paroxysmal atrial fibrillation) (HCC) 08/31/2020   Paroxysmal atrial fibrillation (HCC)    Restless legs syndrome (RLS) 11/20/2012   Stroke (HCC) 02/14/2020   ischemic/right sided affected   Tobacco abuse     Past Surgical History:  Procedure Laterality Date   Arm surgery Right 1969   Abscess excision and debridement at elbow   COLONOSCOPY WITH PROPOFOL  N/A 09/06/2016   Procedure: COLONOSCOPY WITH PROPOFOL ;  Surgeon: Victory LITTIE Legrand DOUGLAS, MD;  Location: [facility];  Service: Gastroenterology;  Laterality: N/A;   I & D EXTREMITY Left 05/29/2014   Procedure: IRRIGATION AND DEBRIDEMENT OF LEFT LEG WOUND AND PLACEMENT OF  INTEGRA  AND  VAC;  Surgeon: Estefana Reichert, DO;  Location: [facility];  Service: Plastics;  Laterality: Left;   implantable loop recorder implant  03/06/2020   Medtronic Reveal [place] model LNQ11 (SN  [id number]) implanted by Dr Kelsie for cryptogenic stroke   INCISION AND DRAINAGE OF WOUND Left 04/23/2014   Procedure:  IRRIGATION AND DEBRIDEMENT LOWER LEFT LEG WOUND WITH PLACEMENT OF INTEGRA AND VAC;  Surgeon: Estefana Reichert, DO;  Location: [facility];  Service: Plastics;  Laterality: Left;   kidney stone removal  2004   MINOR APPLICATION OF WOUND VAC Left 04/23/2014   Procedure: MINOR APPLICATION OF WOUND VAC;  Surgeon: Estefana Reichert, DO;  Location: [facility];  Service: Plastics;  Laterality: Left;   SKIN SPLIT GRAFT Left 06/18/2014   Procedure: SKIN GRAFT SPLIT THICKNESS TO LOWER LEFT LEG WOUND WITH PLACEMENT OF VAC;  Surgeon: Estefana Reichert, DO;  Location: [facility];  Service: Plastics;  Laterality: Left;   VASECTOMY  1994    Allergies  Allergen Reactions   Cashew Nut Oil Anaphylaxis    Cashews, 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   Pistachio Nut (Diagnostic) Hives, Shortness Of Breath, Itching, Swelling and Rash   Latex Dermatitis   Tape Other (See Comments)    Tears and bruises the skin   Betadine [Povidone Iodine] Rash and Other (See Comments)    Raised rash 04/23/14   Lidocaine  Other (See Comments)    Minimal effect with lidocaine , prefers different anesthetic    ROS denies any nausea, vomiting, fever, chills, chest pain, shortness of breath   Physical Exam: There were no vitals filed for this visit.  General: The patient is alert and oriented x3 in no acute distress.  Dermatology: Right third toe ulceration callused over today, there has  been good wound healing progression as there is small area of partial-thickness skin breakdown today following debridement of overlying callus.  Postdebridement measurements 0.4 x 0.4 x 0.2 smears in diameter hemorrhagic callus formation to toes of left foot debrided today, at risk of ulceration.  Vascular: Faintly palpable pedal pulses bilaterally. Capillary refill within normal limits.  No diffuse edema.  Decreased edema to the right third toe.  No erythema.  No warmth increased.  Diminished pedal hair growth.  Neurological:  Protective sensation decreased  Musculoskeletal Exam: Improved alignment of right third toe, decrease of flexor contracture.  Charcot foot deformity noted.  Clawing of the left foot digits noted during gait as well  Radiographic Exam: Right foot MRI 10/06/2023 IMPRESSION: 1. Bone marrow edema and enhancement within the distal phalanx of the third toe, suspicious for early acute osteomyelitis. 2. Flexor tenosynovitis of the third toe distally. 3. Severe osteoarthritis of the first MTP joint. Marrow edema within the great toe proximal phalanx and within the distal aspect of the first metatarsal, likely degenerative/reactive or stress related. 4. Prominent degenerative changes at the second through fourth tarsometatarsal joints.   These results will be called to the ordering clinician or representative by the Radiologist Assistant, and communication documented in the PACS or [employer].     Electronically Signed   By: Mabel Converse D.O.   On: 10/24/2023 15:16    Assessment/Plan of Care: 1. 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 Skin ulcer of third toe of right foot with fat layer exposed (HCC)   3. Hammertoe, bilateral      No orders of the defined types were placed in this encounter.  None  Discussed clinical findings with patient today.  Excisional debridement of the ulceration performed today using 312 scalpel blade of nonviable skin and dermal tissue to patient tolerance postdebridement measurements as described above measurement/described above.  Mupirocin  and bandage applied today.  Reviewed daily dressing changes with patient  Developing preulcerative callus to second toes, these toes are less reducible and fairly stiff.  Continuing with IV antibiotics.  Continue local wound care.  Monitor for signs of infection.  Ease planning allergy panel to determine antibiotics.  Follow-up in 2  Sheralyn Pinegar L. Lamount MAUL, AACFAS [facility]     [address] [place], [place] [address]                Office [phone]  Fax [phone]

## 2023-11-21 ENCOUNTER — Ambulatory Visit: Admitting: Adult Health

## 2023-11-21 ENCOUNTER — Telehealth: Admitting: Neurology

## 2023-11-21 ENCOUNTER — Encounter: Payer: Self-pay | Admitting: Adult Health

## 2023-11-21 ENCOUNTER — Other Ambulatory Visit (HOSPITAL_COMMUNITY): Payer: Self-pay

## 2023-11-21 NOTE — Telephone Encounter (Signed)
 Pt's wife reschedule appointment due to the hot weather. Concerned about bring patient out in the heat.

## 2023-11-21 NOTE — Progress Notes (Deleted)
 [facility] [address] [place].  [address] [phone]       OFFICE FOLLOW UP VISIT NOTE  [name]. Date of Birth:  06-Aug-1960 Medical Record Number:  [id number]   Referring MD: Joette Pebbles Reason for Referral: Stroke  HPI:   Update 11/21/2023 JM: Patient returns for follow-up visit after prior visit over 1 year ago.  Overall stable without new stroke/TIA symptoms.  Cognition ***.   Compliant on Eliquis  and Crestor  without side effects.  Routinely follows with PCP and cardiology.     Of note, s/p right third toe flexor tenotomy for treatment of ulceration of distal third toe, he continues on IV antibiotics for right third toe osteomyelitis, routinely following with podiatry and ID.      History copied from Dr. Bucky prior OV note for reference purposes only Update 08/02/2022 ; he returns for follow-up after last visit a year ago.  He is accompanied by his wife.  States he is doing well.  He has had no recurrent stroke or TIA symptoms.  He remains on Eliquis  which is tolerating well without bruising or bleeding.  His blood pressure is under good control today it is 127/72.  He is tolerating Crestor  well without muscle aches and pains.  Last lipid profile on 07/22/2022 showed LDL cholesterol to be 51 mg percent.  Hemoglobin A1c was 6.7 on 06/10/2022.  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  No acute abnormality.  Patient continues to have mild short-term memory difficulties but these appear to be unchanged and nonprogressive.  Currently mild help with activities of daily living but can manage most of his affairs himself.  He does participate in some cognitively challenging activities like solving crossword puzzles and doing sudoku.  He has no new complaints.  Continues to have right-sided  peripheral vision loss he is careful and has not had any falls or injuries  Update 07/06/2021 : He returns for follow-up after last visit 6 months ago.  He is accompanied by his wife he is doing well.  He has had no recurrent TIA or stroke symptoms.  He remains on Eliquis  which is tolerating well without bruising or bleeding.  States diabetes is under good control and last hemoglobin A1c was 6.7 on 06/08/2021.  His blood pressure also is under good control and today it is 109/71.  He remains on Crestor  which is tolerating well without muscle aches and pains.  Patient has been recently started on Paxil  for anxiety depression which is not sure is working yet.  Continues to have short-term memory difficulties which are present but not getting worse.  He is mostly independent in activities of daily living.  Team needs slight help with getting in and out of shower more for safety reasons we can bathe himself.  He also occasionally stumbles on certain words and when he tries to talk fast.  His mental processing is also slow.  Patient has known obstructive sleep apnea in the past had seen a pulmonologist and was prescribed CPAP but he lost a lot of weight and after that he was told him in no longer needed.  He also has some trouble reading he can see the words but sometimes they do not make sense and he is very slow with his reading.  Update 12/23/2020: He returns for follow-up  after last visit with me 5 months ago.  Is accompanied by his wife.  Patient's wife called me in March 2022 stating patient had been complaining of some new headaches for the last couple of weeks and on a couple of occasions  she noticed that he was found staring into the distance as if he was not there but however when she called out his name he responded right away.  Denied seeing any twitching some is lips or hands or any involuntary seizure-like movements .he had an outpatient MRI scan done on 07/30/2020 which showed small acute to subacute  infarcts involving the right thalamocapsular junction, left frontal and bilateral parietal and left temporal regions with old chronic left PCA infarct and chronic right occipital and cerebellar and thalamic infarcts as well.  He had outpatient EEG on 09/01/2022 which was slightly abnormal showing mild slowing but no epileptiform activity.  Patient was in the [place] trial and given the strokes he was asked to discontinue the study medication and was started on Eliquis  after he was found to have paroxysmal A. fib on the loop recorder on 09/05/2020.  Patient is tolerating Eliquis  well with only minor bruising and no bleeding.  Continues to have short-term memory difficulties and mild word finding difficulties as well.  He has trouble with multitasking as well.  He recently had neuropsych testing which she had a tough time completing however I do not have the results to review today.  He is doing some crossword puzzles but not doing many mentally challenging activities at present.  Is also not been physically very active.  He uses a cane to walk he has had no falls or injuries.  He has had no further recurrent stroke or TIA symptoms.  Update 07/07/2020: He returns for follow-up after last visit 3 months ago.  Is accompanied by his wife.  Patient has noted improvement in his speech peripheral vision but thinks continues to have occasional word finding difficulties and has to speak in a slow.  He notices that if he tries to talk too fast or get excited he has more trouble.  He noticed slight improvement in his right-sided peripheral vision particularly in the quadrant though he still has significant vision loss in the lower quadrant and has not been driving.  He was recently seen by optometry wrist who asked him not to drive.  Patient had loop recorder inserted and so for paroxysmal A. fib has not yet been found.  He did participate in the [place] stroke trial and qualified and is on the trial medication.  Today he signed  consent to be in the cognitive soft study of the trial.  He is tolerating the trial medications well without bleeding or bruising.  His blood pressure is well controlled and today it is borderline at 140/85.  He states his sugars are doing quite good and last A1c checked in January this year was 5.8.  His lipids are satisfactory.  He has been overall eating a healthy diet and is quite controlled on what he eats and is overall lost 130 pounds over the last 1 year.  He is finished outpatient speech therapy but is enrolled to participate in [place] summer program for speech and language.His Paxil  dose was recently increased by his primary care physician to help with his underlying anxiety and stress.  He does have diabetic peripheral neuropathy but Lyrica  seems to help his paresthesias quite well and is able to sleep well at night.  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    Initial visit 04/02/2020 Mr. Kindt is a 63 year old Caucasian male seen today for initial office consultation visit for stroke.  He is accompanied by his wife.  History is obtained from them and review of electronic medical records and I personally reviewed imaging films in PACS.63 y.o. male with medical history significant of hypertension, hyperlipidemia, type 2 diabetes mellitus, restless leg syndrome, diabetic neuropathy, morbid obesity, tobacco abuse, depression/anxiety, GERD, chronic venous stasis ulcer in bilateral lower extremities presented to emergency department with headache and right thigh vision loss started 2 AM on the morning of admission on 02/14/2020.  He initially went to his ophthalmologist and was referred to the emergency department.  Concern was for acute stroke.  Patient was hospitalized for further management.  CT perfusion scan suggested a left occipital lobe infarct.  MRI was attempted  but patient refused due to significant claustrophobia.  Subsequent repeat CT scan confirmed left posterior division MCA infarct.  CT angiogram showed no significant large vessel stenosis or occlusion.  Lower extremity venous Dopplers were negative for DVT.  Transthoracic echo showed normal ejection fraction without cardiac source of embolism.  LDL cholesterol was quite low at 15 mg percent and hemoglobin A1c was elevated 8.4.  Patient was started on aspirin  and Plavix  for 3 weeks followed by aspirin  alone.  Patient states is done well since discharge she is now beginning to see some movement in the right peripheral field of vision but is still not able to count fingers.  He still has trouble reading and comprehending.  His speech has improved though it is still slightly hesitant.  Is tolerating aspirin  well without bruising or bleeding.  Patient is unable to return to work in [employer] and is currently on short-term disability.  He has not been driving.  He denies any prior history of atrial fibrillation, syncope or palpitations.  He had outpatient loop recorder placed by Dr. Kelsie and so for paroxysmal A. fib has not yet been found.  He also complains of mild short-term memory and cognitive difficulties which are new since his stroke.    ROS:   14 system review of systems is positive for vision difficulties memory loss, speech and word finding difficulties difficulty with reading and comprehension, anxiety all other systems negative PMH:  Past Medical History:  Diagnosis Date   Angio-edema    Anxiety    Claudication in peripheral vascular disease (HCC) 05/07/2022   Depression    Diabetes mellitus type II    Diabetic polyneuropathy associated with type 2 diabetes mellitus (HCC) 06/30/2018   GERD (gastroesophageal reflux disease)    History of kidney stones    HLD (hyperlipidemia)    HTN (hypertension)    Morbid obesity (HCC)    PAF (paroxysmal atrial fibrillation) (HCC) 08/31/2020   Paroxysmal  atrial fibrillation (HCC)    Restless legs syndrome (RLS) 11/20/2012   Stroke (HCC) 02/14/2020   ischemic/right sided affected   Tobacco abuse     Social History:  Social History   Socioeconomic History   Marital status: Married    Spouse name: ryver poblete   Number of children: 3   Years of education: Not on file   Highest education level: 12th grade  Occupational History   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    Comment: no longer working 07/07/20  Tobacco  Use   Smoking status: Former    Current packs/day: 0.00    Types: E-cigarettes, Cigarettes    Quit date: 08/31/1973    Years since quitting: 50.2    Passive exposure: Never   Smokeless tobacco: Never   Tobacco comments:    Not smoking current /    Patient states he vapes - 10/25/2023  Vaping Use   Vaping status: Every Day   Substances: Nicotine, Flavoring  Substance and Sexual Activity   Alcohol  use: Not Currently    Alcohol /week: 0.0 standard drinks of alcohol     Comment: rarely   Drug use: No   Sexual activity: Yes    Partners: Female  Other Topics Concern   Not on file  Social History Narrative   Lives with wife   Right Handed   Drink 6-7 cups caffeine  daily   Social Drivers of Health   Financial Resource Strain: High Risk (08/26/2023)   Overall Financial Resource Strain (CARDIA)    Difficulty of Paying Living Expenses: Very hard  Food Insecurity: Food Insecurity Present (08/26/2023)   Hunger Vital Sign    Worried About Running Out of Food in the Last Year: Often true    Ran Out of Food in the Last Year: Often true  Transportation Needs: Unmet Transportation Needs (08/26/2023)   PRAPARE - Transportation    Lack of Transportation (Medical): Yes    Lack of Transportation (Non-Medical): Yes  Physical Activity: Inactive (08/26/2023)   Exercise Vital Sign    Days of Exercise per Week: 0 days    Minutes of Exercise per Session: 20 min  Stress: Stress Concern Present (08/26/2023)   [employer] of Occupational [employer] - Occupational Stress Questionnaire    Feeling of Stress : Very much  Social Connections: Socially Integrated (08/26/2023)   Social Connection and Isolation Panel    Frequency of Communication with Friends and Family: More than three times a week    Frequency of Social Gatherings with Friends and Family: Once a week    Attends Religious Services: 1 to 4 times per year    Active Member of [employer] or Organizations: No    Attends Engineer, structural: More than 4 times per year    Marital Status: Married  Catering manager Violence: Not on file    Medications:   Current Outpatient Medications on File Prior to Visit  Medication Sig Dispense Refill   acetaminophen  (TYLENOL ) 500 MG tablet Take 2 tablets (1,000 mg total) by mouth 2 (two) times daily as needed (for pain). 120 tablet 3   Alcohol  Swabs  (B-D SINGLE USE SWABS  REGULAR) PADS Use to check blood sugar daily 100 each 3   Blood Glucose Monitoring Suppl (BLOOD GLUCOSE MONITOR SYSTEM) w/Device KIT Use as directed once daily. 1 kit 0   buPROPion  (WELLBUTRIN  XL) 150 MG 24 hr tablet Take 1 tablet (150 mg total) by mouth daily. 90 tablet 1   cetirizine (ZYRTEC) 10 MG tablet Take 10 mg by mouth daily.     ciprofloxacin  (CIPRO ) 750 MG tablet Take 1 tablet (750 mg total) by mouth 2 (two) times daily. 60 tablet 1   clonazePAM  (KLONOPIN ) 0.5 MG tablet Take 1 tablet (0.5 mg total) by mouth 2 (two) times daily. 60 tablet 1   COVID-19 mRNA vaccine, Pfizer, (COMIRNATY ) syringe Inject into the muscle. (Patient not taking: Reported on 11/07/2023) 0.3 mL 0   diltiazem  (CARDIZEM ) 30 MG tablet TAKE 1 TABLET BY MOUTH EVERY 4 HOURS AS NEEDED FOR HEART RATE>100. 90  tablet 0   doxycycline  (VIBRA -TABS) 100 MG tablet Take 1 tablet (100 mg total) by mouth 2 (two) times daily for 14 days. 28 tablet 0   Dulaglutide  (TRULICITY ) 4.5 MG/0.5ML SOAJ Inject 4.5 mg into the skin once a week. 6 mL 3   DULoxetine  (CYMBALTA ) 30 MG capsule  Take 2 capsules (60 mg total) by mouth daily AND 1 capsule (30 mg total) at bedtime. (Patient not taking: Reported on 11/07/2023) 90 capsule 0   DULoxetine  (CYMBALTA ) 60 MG capsule Take 1 capsule (60 mg total) by mouth 2 (two) times daily. 180 capsule 0   empagliflozin  (JARDIANCE ) 25 MG TABS tablet Take 1 tablet (25 mg total) by mouth daily before breakfast. 90 tablet 3   EPINEPHrine  0.3 mg/0.3 mL IJ SOAJ injection Inject 0.3 mg into the muscle as needed for anaphylaxis. 2 each 0   FLUoxetine  (PROZAC ) 40 MG capsule Take 2 capsules (80 mg total) by mouth daily. 180 capsule 0   Glucose Blood (BLOOD GLUCOSE TEST STRIPS) STRP Use as directed once daily. 100 each 3   hydrochlorothiazide  (HYDRODIURIL ) 25 MG tablet Take 1 tablet (25 mg total) by mouth daily. 90 tablet 3   hydrOXYzine (ATARAX) 10 MG tablet Take 10 mg by mouth 2 (two) times daily as needed.     Lancet Device MISC Use as directed in the morning, at noon, and at bedtime. 1 each 0   Lancets MISC Use as directed in the morning, at noon, and at bedtime. 300 each 3   lisinopril  (ZESTRIL ) 10 MG tablet Take 1 tablet (10 mg total) by mouth daily. 90 tablet 3   metFORMIN  (GLUCOPHAGE -XR) 500 MG 24 hr tablet Take 4 tablets (2,000 mg total) by mouth daily in the afternoon. 360 tablet 3   Multiple Vitamin (MULTIVITAMIN) tablet Take 1 tablet by mouth daily. 100 tablet 3   mupirocin  ointment (BACTROBAN ) 2 % Apply 1 Application topically 2 (two) times daily. 22 g 2   oxyCODONE -acetaminophen  (PERCOCET/ROXICET) 5-325 MG tablet Take 1 tablet by mouth every 6 (six) hours as needed for up to 12 doses for severe pain (pain score 7-10). 12 tablet 0   pantoprazole  (PROTONIX ) 40 MG tablet Take 1 tablet (40 mg total) by mouth daily. 90 tablet 3   pentoxifylline  (TRENTAL ) 400 MG CR tablet Take 1 tablet (400 mg total) by mouth 3 (three) times daily with meals. 270 tablet 3   pregabalin  (LYRICA ) 300 MG capsule TAKE 1 CAPSULE(300 MG) BY MOUTH TWICE DAILY 180 capsule 1    repaglinide  (PRANDIN ) 1 MG tablet Take 0.5 tablets (0.5 mg total) by mouth 2 (two) times daily before a meal.     rOPINIRole  (REQUIP ) 0.5 MG tablet Take 1 tablet (0.5 mg total) by mouth 2 (two) times daily.     rosuvastatin  (CRESTOR ) 20 MG tablet Take 1 tablet (20 mg total) by mouth at bedtime. 90 tablet 3   STUDY - LIBREXIA-AF - apixaban  5 mg or placebo capsule (PI-Sethi) Take 1 capsule (5 mg total) by mouth 2 (two) times daily. Take at approximately the same time of day with or without food. Please bring bottle back with you to every visit; do not discard bottle. Please contact [facility] for any questions or concerns regarding this medication. For Investigational Use Only. 420 capsule 0   STUDY - LIBREXIA-AF - [id number] (Milvexian) 100 mg or placebo tablet (PI-Sethi) Take 1 tablet by mouth 2 (two) times daily. Take at approximately the same time of day with or without food. Please  bring bottle back with you to every visit; do not discard bottle. Please contact [facility] for any questions or concerns regarding this medication. For Investigational Use Only. 420 tablet 0   triamcinolone  cream (KENALOG ) 0.1 % Apply 1 Application topically 2 (two) times daily. 454 g 1   TRUEplus Lancets 33G MISC Use to check blood sugar 2 times a day 200 each 3   No current facility-administered medications on file prior to visit.    Allergies:   Allergies  Allergen Reactions   Cashew Nut Oil Anaphylaxis    Cashews, 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   Pistachio Nut (Diagnostic) Hives, Shortness Of Breath, Itching, Swelling and Rash   Latex Dermatitis   Tape Other (See Comments)    Tears and bruises the skin   Betadine [Povidone Iodine] Rash and Other (See Comments)    Raised rash 04/23/14   Lidocaine  Other (See Comments)    Minimal effect with lidocaine , prefers different anesthetic    Physical Exam There were no vitals filed for this visit. There is no height or weight on file to  calculate BMI.   General: Obese middle-aged Caucasian male seated, in no evident distress Head: head normocephalic and atraumatic.   Neck: supple with no carotid or supraclavicular bruits Cardiovascular: regular rate and rhythm, no murmurs Musculoskeletal: no deformity Skin:  no rash/petichiae  Neurologic Exam Mental Status: Awake and fully alert. Oriented to place and time. Recent and remote memory intact. Attention span, concentration and fund of knowledge appropriate. Mood and affect appropriate.  Speech is slightly nonfluent, deliberate and hesitant but no dysarthria or paraphasic errors.  Diminished recall 1/3.  Able to name 10 animals which can walk on 4 legs.  Clock drawing 4/4. Cranial Nerves: Pupils equal, briskly reactive to light. Extraocular movements full without nystagmus. Visual fields show partial right homonymous hemianopsia to confrontation with more deficit in the inferior quadrant.SABRA Hearing intact. Facial sensation intact.  Mild right lower facial asymmetry.  Tongue, palate moves normally and symmetrically.  Motor: Normal bulk and tone. Normal strength in all tested extremity muscles. Sensory.: intact to touch , pinprick , position and vibratory sensation.  Coordination: Rapid alternating movements normal in all extremities. Finger-to-nose and heel-to-shin performed accurately bilaterally. Gait and Station: Arises from chair without difficulty. Stance is normal. Gait demonstrates normal stride length and balance . Able to heel, toe and tandem walk with great difficulty.  Reflexes: 1+ and symmetric. Toes downgoing.       ASSESSMENT: 63 year old Caucasian male with embolic left MCA branch infarct in September 2021 and bicerebral small infarcts in March 2022 of cryptogenic etiology.  But subsequently paroxysmal A. fib found on loop recorder on 09/05/2020. Vascular risk factors of diabetes, hypertension hyperlipidemia. PAF and obesity.  He also has persistent right-sided  peripheral vision loss, mild expressive language difficulties mild post stroke cognitive impairment.  He is doing well and stable from neurovascular standpoint.     PLAN:  -continue Eliquis  5 mg twice daily and Crestor  20 mg daily for secondary stroke prevention managed/prescribed by PCP/cardiology - Continue close PCP and cardiology follow-up for aggressive stroke risk factor management   I had a long d/w patient about his remote cryptogenic strokes, mild residual aphasia, visual field defect and cognitive impairment, risk for recurrent stroke/TIAs, personally independently reviewed imaging studies and stroke evaluation results and answered questions.Continue Eliquis  (apixaban ) daily  for secondary stroke prevention for history of paroxysmal A-fib and maintain strict control of hypertension with blood pressure goal below 130/90, diabetes with hemoglobin  A1c goal below 6.5% and lipids with LDL cholesterol goal below 70 mg/dL. I also advised the patient to eat a healthy diet with plenty of whole grains, cereals, fruits and vegetables, exercise regularly and maintain ideal body weight.  Refer to sleep clinic for discussion about treatment options for obstructive sleep apnea.  I also recommend he increase participation in cognitively challenging activities like solving crossword puzzles, playing bridge and sudoku.  We also discussed memory compensation strategies.  Patient may also consider possible participation in the  [place] AF trial if interested and will be given information to review at home and decide followup in the future with me in 1 year or call earlier if necessary.Greater than 50% time during this prolonged 35-minute visit was spent on counseling and coordination of care about his cryptogenic stroke discussion about stroke prevention treatment and answering questions.    I personally spent a total of *** minutes in the care of the patient today including {Time Based  Coding:[id number]}.  Harlene Bogaert, AGNP-BC  [facility] [address] [place], [place] [address]  Phone [phone] Fax [phone] Note: This document was prepared with digital dictation and possible smart phrase technology. Any transcriptional errors that result from this process are unintentional.

## 2023-11-22 ENCOUNTER — Other Ambulatory Visit: Payer: Self-pay | Admitting: Family Medicine

## 2023-11-25 ENCOUNTER — Other Ambulatory Visit: Payer: Self-pay | Admitting: Family Medicine

## 2023-11-25 DIAGNOSIS — M869 Osteomyelitis, unspecified: Secondary | ICD-10-CM | POA: Diagnosis not present

## 2023-11-27 ENCOUNTER — Other Ambulatory Visit (HOSPITAL_COMMUNITY): Payer: Self-pay

## 2023-11-27 ENCOUNTER — Other Ambulatory Visit: Payer: Self-pay | Admitting: Family Medicine

## 2023-11-27 ENCOUNTER — Other Ambulatory Visit: Payer: Self-pay

## 2023-11-27 DIAGNOSIS — M869 Osteomyelitis, unspecified: Secondary | ICD-10-CM | POA: Diagnosis not present

## 2023-11-27 MED ORDER — LANCET DEVICE MISC
1.0000 | Freq: Three times a day (TID) | 0 refills | Status: AC
  Filled 2023-11-27 – 2023-12-05 (×2): qty 1, 30d supply, fill #0

## 2023-11-28 ENCOUNTER — Other Ambulatory Visit: Payer: Self-pay

## 2023-11-28 ENCOUNTER — Ambulatory Visit (INDEPENDENT_AMBULATORY_CARE_PROVIDER_SITE_OTHER): Admitting: Infectious Diseases

## 2023-11-28 ENCOUNTER — Telehealth: Payer: Self-pay

## 2023-11-28 ENCOUNTER — Encounter: Payer: Self-pay | Admitting: Infectious Diseases

## 2023-11-28 ENCOUNTER — Other Ambulatory Visit (HOSPITAL_COMMUNITY): Payer: Self-pay

## 2023-11-28 VITALS — BP 100/67 | HR 94 | Temp 97.8°F | Ht 69.69 in | Wt 288.0 lb

## 2023-11-28 DIAGNOSIS — Z79899 Other long term (current) drug therapy: Secondary | ICD-10-CM | POA: Diagnosis not present

## 2023-11-28 DIAGNOSIS — M869 Osteomyelitis, unspecified: Secondary | ICD-10-CM | POA: Diagnosis not present

## 2023-11-28 DIAGNOSIS — Z880 Allergy status to penicillin: Secondary | ICD-10-CM

## 2023-11-28 DIAGNOSIS — Z452 Encounter for adjustment and management of vascular access device: Secondary | ICD-10-CM | POA: Diagnosis not present

## 2023-11-28 MED ORDER — CEFADROXIL 500 MG PO CAPS
1000.0000 mg | ORAL_CAPSULE | Freq: Two times a day (BID) | ORAL | 0 refills | Status: AC
  Filled 2023-11-28: qty 60, 15d supply, fill #0

## 2023-11-28 MED ORDER — DOXYCYCLINE HYCLATE 100 MG PO TABS
100.0000 mg | ORAL_TABLET | Freq: Two times a day (BID) | ORAL | 0 refills | Status: DC
  Filled 2023-11-28: qty 30, 15d supply, fill #0

## 2023-11-28 MED ORDER — HYDROCHLOROTHIAZIDE 25 MG PO TABS
25.0000 mg | ORAL_TABLET | Freq: Every day | ORAL | 3 refills | Status: AC
  Filled 2023-11-28: qty 90, 90d supply, fill #0
  Filled 2024-02-19: qty 90, 90d supply, fill #1
  Filled 2024-02-19 – 2024-05-12 (×2): qty 90, 90d supply, fill #2

## 2023-11-28 NOTE — Progress Notes (Addendum)
 Patient Active Problem List   Diagnosis Date Noted   Osteomyelitis of toe of right foot (HCC) 10/25/2023   Needs peripherally inserted central catheter (PICC) 10/25/2023   Penicillin allergy 10/04/2023   PVD (peripheral vascular disease) (HCC) 10/04/2023   Diabetic foot infection (HCC) 10/03/2023   Osteomyelitis (HCC) 10/03/2023   Medication management 10/03/2023   Skin ulcer of third toe (HCC) 07/05/2023   Claudication in peripheral vascular disease (HCC) 05/07/2022   PAF (paroxysmal atrial fibrillation) (HCC) 08/31/2020   Panic attacks 06/23/2020   Aortic calcification (HCC) 02/22/2020   Carotid artery calcification, bilateral 02/22/2020   MDD (major depressive disorder), recurrent episode (HCC)    GERD (gastroesophageal reflux disease)    Ischemic stroke (HCC), occipital    Diabetic polyneuropathy associated with type 2 diabetes mellitus (HCC) 06/30/2018   Erectile dysfunction due to type 2 diabetes mellitus (HCC) 06/30/2018   Circulation disorder of lower extremity 05/31/2015   IBS (irritable bowel syndrome) 12/24/2014   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   ALCOHOL  SWABS  (B-D SINGLE USE SWABS  REGULAR) PADS    Use to check blood sugar daily   BLOOD GLUCOSE MONITORING SUPPL (BLOOD GLUCOSE MONITOR SYSTEM) W/DEVICE KIT    Use as directed once daily.   BUPROPION  (WELLBUTRIN  XL) 150 MG 24 HR TABLET     Take 1 tablet (150 mg total) by mouth daily.   CETIRIZINE (ZYRTEC) 10 MG TABLET    Take 10 mg by mouth daily.   CIPROFLOXACIN  (CIPRO ) 750 MG TABLET    Take 1 tablet (750 mg total) by mouth 2 (two) times daily.   CLONAZEPAM  (KLONOPIN ) 0.5 MG TABLET    Take 1 tablet (0.5 mg total) by mouth 2 (two) times daily.   COVID-19 MRNA VACCINE, PFIZER, (COMIRNATY ) SYRINGE    Inject into the muscle.   DILTIAZEM  (CARDIZEM ) 30 MG TABLET    TAKE 1 TABLET BY MOUTH EVERY 4 HOURS AS NEEDED FOR HEART RATE>100.   DOXYCYCLINE  (VIBRA -TABS) 100 MG TABLET    Take 1 tablet (100 mg total) by mouth 2 (two) times daily for 14 days.   DULAGLUTIDE  (TRULICITY ) 4.5 MG/0.5ML SOAJ    Inject 4.5 mg into the skin once a week.   DULOXETINE  (CYMBALTA ) 30 MG CAPSULE    Take 2 capsules (60 mg total) by mouth daily AND 1 capsule (30 mg total) at bedtime.   DULOXETINE  (CYMBALTA ) 60 MG CAPSULE    Take 1 capsule (60 mg total) by mouth 2 (two) times daily.   EMPAGLIFLOZIN  (JARDIANCE ) 25 MG TABS TABLET    Take 1 tablet (25 mg total) by mouth daily before breakfast.   EPINEPHRINE  0.3 MG/0.3 ML IJ SOAJ INJECTION    Inject 0.3 mg into the muscle as needed for anaphylaxis.   FLUOXETINE  (PROZAC ) 40 MG CAPSULE    Take 2 capsules (80 mg total) by mouth daily.   GLUCOSE BLOOD (  BLOOD GLUCOSE TEST STRIPS) STRP    Use as directed once daily.   HYDROCHLOROTHIAZIDE  (HYDRODIURIL ) 25 MG TABLET    Take 1 tablet (25 mg total) by mouth daily.   HYDROXYZINE (ATARAX) 10 MG TABLET    Take 10 mg by mouth 2 (two) times daily as needed.   LANCET DEVICE MISC    Use as directed in the morning, at noon, and at bedtime.   LANCETS MISC    Use as directed in the morning, at noon, and at bedtime.   LISINOPRIL  (ZESTRIL ) 10 MG TABLET    Take 1 tablet (10 mg total) by mouth daily.   METFORMIN  (GLUCOPHAGE -XR) 500 MG 24 HR TABLET    Take 4 tablets (2,000 mg total) by mouth daily in the afternoon.   MULTIPLE VITAMIN (MULTIVITAMIN) TABLET    Take 1 tablet by mouth daily.    MUPIROCIN  OINTMENT (BACTROBAN ) 2 %    Apply 1 Application topically 2 (two) times daily.   OXYCODONE -ACETAMINOPHEN  (PERCOCET/ROXICET) 5-325 MG TABLET    Take 1 tablet by mouth every 6 (six) hours as needed for up to 12 doses for severe pain (pain score 7-10).   PANTOPRAZOLE  (PROTONIX ) 40 MG TABLET    Take 1 tablet (40 mg total) by mouth daily.   PENTOXIFYLLINE  (TRENTAL ) 400 MG CR TABLET    Take 1 tablet (400 mg total) by mouth 3 (three) times daily with meals.   PREGABALIN  (LYRICA ) 300 MG CAPSULE    TAKE 1 CAPSULE(300 MG) BY MOUTH TWICE DAILY   REPAGLINIDE  (PRANDIN ) 1 MG TABLET    Take 0.5 tablets (0.5 mg total) by mouth 2 (two) times daily before a meal.   ROPINIROLE  (REQUIP ) 0.5 MG TABLET    Take 1 tablet (0.5 mg total) by mouth 2 (two) times daily.   ROSUVASTATIN  (CRESTOR ) 20 MG TABLET    Take 1 tablet (20 mg total) by mouth at bedtime.   STUDY - LIBREXIA-AF - APIXABAN  5 MG OR PLACEBO CAPSULE (PI-SETHI)    Take 1 capsule (5 mg total) by mouth 2 (two) times daily. Take at approximately the same time of day with or without food. Please bring bottle back with you to every visit; do not discard bottle. Please contact [facility] for any questions or concerns regarding this medication. For Investigational Use Only.   STUDY - LIBREXIA-AF - [id number] (MILVEXIAN) 100 MG OR PLACEBO TABLET (PI-SETHI)    Take 1 tablet by mouth 2 (two) times daily. Take at approximately the same time of day with or without food. Please bring bottle back with you to every visit; do not discard bottle. Please contact [facility] for any questions or concerns regarding this medication. For Investigational Use Only.   TRIAMCINOLONE  CREAM (KENALOG ) 0.1 %    Apply 1 Application topically 2 (two) times daily.   TRUEPLUS LANCETS 33G MISC    Use to check blood sugar 2 times a day  Modified Medications   No medications on file  Discontinued Medications   No medications on file     Subjective: Discussed the use of AI scribe software for clinical note transcription with the patient, who gave verbal consent to proceed.   63 year old male with prior h/o DM2 w polyneuropathy, anxiety/depression, PVD, HTN, HLD, Kidney stones, Obesity, CVA w rt sided weakness, PAF, RLS and tobacco abuse who is referred from Podiatry for concerns for rt third toe osteomyelitis.  He is accompanied by his daughter. He reports having a non-healing wound on  the plantar surface of his rt middle toe, persisting for approximately six months after a callus was cut too deeply. The wound has shown slow improvement but with occasional serous drainage, noted as of this morning. He has completed two 21-day courses of doxycycline  over the past two months without full resolution of the wound, raising concerns for osteomyelitis.  Reports DM is well-controlled as well as has experienced ten strokes, resulting in cognitive impairment and bilateral hemiparesis, affecting his gait. He has restless leg syndrome and has undergone laser therapy for circulation issues.  He experiences nausea, attributed to Trulicity , and had a single episode of diarrhea. He denies fever, chills, vomiting, or other gastrointestinal symptoms.   He resides with his wife and daughter, who assist with his care, including wound dressing. He uses walls for support when ambulating indoors and requires additional support outdoors.  5/28 Taking PO doxyccyline. Discussed lab results from last visit as well as MRI findings. Seen by Dr Lamount on 5/15 and patient does not want amputation. Seen by Dr Gaynel on 5/20 for debridement of toe nails and callus. Discussed plan of care with daughter and wife over phonewith PICC line and IV antibiotics for osteomyelitis. No other concerns.   7/1 Currently getting IV daptomycin through PICC and taking PO ciprofloxacin , no concerns with antibiotics. Reports he feels uncomfortable with the tape of the PICC and  thinks allergic. Seen by allergist and has passed the challenge test. Last seen by Podiatry on 6/20- debridement of callus at rt third toe, also noted to have preulcerative callus of rt second toe. He reports while walking toe gets curled up and the it causes friction in the second toe and has been using protective coverings. Denies fevers, chills, nausea, vomiting and diarrhea. No other complaints.   Review of Systems: all systems reviewed with pertinent positives and negatives as listed above  Past Medical History:  Diagnosis Date   Angio-edema    Anxiety    Claudication in peripheral vascular disease (HCC) 05/07/2022   Depression    Diabetes mellitus type II    Diabetic polyneuropathy associated with type 2 diabetes mellitus (HCC) 06/30/2018   GERD (gastroesophageal reflux disease)    History of kidney stones    HLD (hyperlipidemia)    HTN (hypertension)    Morbid obesity (HCC)    PAF (paroxysmal atrial fibrillation) (HCC) 08/31/2020   Paroxysmal atrial fibrillation (HCC)    Restless legs syndrome (RLS) 11/20/2012   Stroke (HCC) 02/14/2020   ischemic/right sided affected   Tobacco abuse    Past Surgical History:  Procedure Laterality Date   Arm surgery Right 1969   Abscess excision and debridement at elbow   COLONOSCOPY WITH PROPOFOL  N/A 09/06/2016   Procedure: COLONOSCOPY WITH PROPOFOL ;  Surgeon: Victory LITTIE Legrand DOUGLAS, MD;  Location: [facility];  Service: Gastroenterology;  Laterality: N/A;   I & D EXTREMITY Left 05/29/2014   Procedure: IRRIGATION AND DEBRIDEMENT OF LEFT LEG WOUND AND PLACEMENT OF  INTEGRA  AND  VAC;  Surgeon: Estefana Reichert, DO;  Location: [facility];  Service: Plastics;  Laterality: Left;   implantable loop recorder implant  03/06/2020   Medtronic Reveal [place] model LNQ11 (SN  [id number]) implanted by Dr Kelsie for cryptogenic stroke   INCISION AND DRAINAGE OF WOUND Left 04/23/2014   Procedure: IRRIGATION AND DEBRIDEMENT LOWER LEFT LEG WOUND WITH  PLACEMENT OF INTEGRA AND VAC;  Surgeon: Estefana Reichert, DO;  Location: [facility];  Service: Plastics;  Laterality: Left;  kidney stone removal  2004   MINOR APPLICATION OF WOUND VAC Left 04/23/2014   Procedure: MINOR APPLICATION OF WOUND VAC;  Surgeon: Estefana Reichert, DO;  Location: [facility];  Service: Plastics;  Laterality: Left;   SKIN SPLIT GRAFT Left 06/18/2014   Procedure: SKIN GRAFT SPLIT THICKNESS TO LOWER LEFT LEG WOUND WITH PLACEMENT OF VAC;  Surgeon: Estefana Reichert, DO;  Location: [facility];  Service: Plastics;  Laterality: Left;   VASECTOMY  1994     Social History   Tobacco Use   Smoking status: Former    Current packs/day: 0.00    Types: E-cigarettes, Cigarettes    Quit date: 08/31/1973    Years since quitting: 50.2    Passive exposure: Never   Smokeless tobacco: Never   Tobacco comments:    Not smoking current /    Patient states he vapes - 10/25/2023  Vaping Use   Vaping status: Every Day   Substances: Nicotine, Flavoring  Substance Use Topics   Alcohol  use: Not Currently    Alcohol /week: 0.0 standard drinks of alcohol     Comment: rarely   Drug use: No    Family History  Problem Relation Age of Onset   Depression Mother    Anxiety disorder Mother    Stroke Mother        multiple   Leukemia Mother    Pneumonia Mother    Cancer Mother    Diabetes Mother    Diabetes Father    Heart disease Father    Hyperlipidemia Father    Hypertension Father    Allergic rhinitis Neg Hx    Angioedema Neg Hx    Asthma Neg Hx    Eczema Neg Hx    Immunodeficiency Neg Hx    Urticaria Neg Hx     Allergies  Allergen Reactions   Cashew Nut Oil Anaphylaxis    Cashews, 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   Pistachio Nut (Diagnostic) Hives, Shortness Of Breath, Itching, Swelling and Rash   Latex Dermatitis   Tape Other (See Comments)    Tears and bruises the skin   Betadine [Povidone Iodine] Rash and Other (See Comments)    Raised rash  04/23/14   Lidocaine  Other (See Comments)    Minimal effect with lidocaine , prefers different anesthetic    Health Maintenance  Topic Date Due   INFLUENZA VACCINE  12/29/2023   HEMOGLOBIN A1C  01/18/2024   COVID-19 Vaccine (5 - 2024-25 season) 03/02/2024   FOOT EXAM  07/06/2024   Diabetic kidney evaluation - Urine ACR  07/20/2024   Medicare Annual Wellness (AWV)  08/29/2024   Diabetic kidney evaluation - eGFR measurement  10/02/2024   OPHTHALMOLOGY EXAM  10/29/2024   DTaP/Tdap/Td (2 - Td or Tdap) 04/20/2026   Colonoscopy  09/07/2026   Pneumococcal Vaccine 16-20 Years old  Completed   Hepatitis C Screening  Completed   HIV Screening  Completed   Zoster Vaccines- Shingrix   Completed   Hepatitis B Vaccines  Aged Out   HPV VACCINES  Aged Out   Meningococcal B Vaccine  Aged Out    Objective: BP 100/67   Pulse 94   Temp 97.8 F (36.6 C) (Oral)   Ht 5' 9.69 (1.77 m)   Wt 288 lb (130.6 kg)   SpO2 97%   BMI 41.69 kg/m    Physical Exam Constitutional:      Appearance: Normal appearance. Morbidly obese  HENT:     Head: Normocephalic and atraumatic.  Mouth: Mucous membranes are moist.  Eyes:    Conjunctiva/sclera: Conjunctivae normal.     Pupils: Pupils are equal, round, and b/l symmetrical    Cardiovascular:     Rate and Rhythm: Normal rate and Irregular rhythm.     Heart sounds:  Pulmonary:     Effort: Pulmonary effort is normal.     Breath sounds:   Abdominal:     General: Non distended     Palpations:   Musculoskeletal:        General: ambulatory with support   Skin:    General: Skin is warm and dry.     Comments: PICC line in rt arm OK with no signs of infection or allergic rashes   Rt third toe wound almost healed, no signs of infection   RT second toe with preulcerative callus, no signs of infection.    Neurological:     General:     Mental Status: awake, alert and oriented to person, place, and time.   Psychiatric:        Mood and Affect:  Mood normal.   Lab Results Lab Results  Component Value Date   WBC 8.0 10/03/2023   HGB 15.9 10/03/2023   HCT 49.5 10/03/2023   MCV 88.1 10/03/2023   PLT 211 10/03/2023    Lab Results  Component Value Date   CREATININE 1.29 10/03/2023   BUN 20 10/03/2023   NA 139 10/03/2023   K 4.3 10/03/2023   CL 102 10/03/2023   CO2 30 10/03/2023    Lab Results  Component Value Date   ALT 17 10/03/2023   AST 15 10/03/2023   ALKPHOS 32 (L) 07/21/2023   BILITOT 0.6 10/03/2023    Lab Results  Component Value Date   CHOL 104 07/21/2023   HDL 35.50 (L) 07/21/2023   LDLCALC 27 07/21/2023   LDLDIRECT 76 02/19/2014   TRIG 205.0 (H) 07/21/2023   CHOLHDL 3 07/21/2023   No results found for: LABRPR, RPRTITER No results found for: HIV1RNAQUANT, HIV1RNAVL, CD4TABS   Imaging IR PICC PLACEMENT RIGHT >5 YRS INC IMG GUIDE Result Date: 11/01/2023 INDICATION: Pt with right foot ulcer requiring prolonged outpatient IV antibiotics. IR consulted for PICC line placement. EXAM: ULTRASOUND AND FLUOROSCOPIC GUIDED PICC LINE INSERTION MEDICATIONS: 4 mL 1% tetracaine  CONTRAST:  None FLUOROSCOPY TIME:  (3.9 mGy) COMPLICATIONS: None immediate. TECHNIQUE: The procedure, risks, benefits, and alternatives were explained to the patient and informed written consent was obtained. A timeout was performed prior to the initiation of the procedure. The right upper extremity was prepped with chlorhexidine  in a sterile fashion, and a sterile drape was applied covering the operative field. Maximum barrier sterile technique with sterile gowns and gloves were used for the procedure. A timeout was performed prior to the initiation of the procedure. Local anesthesia was provided with 1% tetracaine . Under direct ultrasound guidance, the basilic vein was accessed with a micropuncture kit after the overlying soft tissues were anesthetized with 1% tetracaine . Real-time ultrasound guidance was utilized for vascular access including  the acquisition of a permanent ultrasound image documenting patency of the accessed vessel. A guidewire was advanced to the level of the superior caval-atrial junction for measurement purposes and the PICC line was cut to length. A peel-away sheath was placed and a 44 cm, 5 [place], single lumen was inserted to level of the superior caval-atrial junction. A post procedure spot fluoroscopic was obtained. The catheter easily aspirated and flushed and was secured in place with stat lock device.  A dressing was applied. The patient tolerated the procedure well without immediate post procedural complication. FINDINGS: After catheter placement, the tip lies within the superior cavoatrial junction. The catheter aspirates and flushes normally and is ready for immediate use. IMPRESSION: Successful ultrasound and fluoroscopic guided placement of a right basilic vein approach, 44 cm, 5 French, single lumen PICC with tip at the superior caval-atrial junction. The PICC line is ready for immediate use. Performed by: Wyatt Pommier, PA-C Electronically Signed   By: CHRISTELLA.  Shick M.D.   On: 11/01/2023 11:48    Imaging per podiatry note Right foot 3 views weightbearing 09/14/2023 Joint spaces preserved.  On close review of imaging from February, there are signs of very mild osteolysis and cortical irregularities at the distal third toe.  This does appear relatively stable from films from 3/20 and from 4/3  Assessment/Plan # Rt third toe osteomyelitis  - on prolonged course of PO doxycycline > PO ciprofloxacin  added 5/28 viist until IV antibiotics started on 6/4 as below - IV daptomycin and ceftriaxone from 6/4 -  till date   Plan - DC PICC line tomorrow, start PO doxycycline  and cefadroxil for 2 weeks as ordered to complete 6 weeks course  - fu in 2 weeks   # Medication management  - labs from 6/30 discussed, CBC and CMP unremarkable, CR 1.55 with normal ESR and CRP, CK 309  # RT second toe pre-ulcerative callus -  discussed to wear protective shoes and padding  # DM  - BG control  - Fu with PCP  # Penicillin allergy  - Has passed challenge and allergy removed   I spent 26 minutes involved in face-to-face and non-face-to-face activities for this patient on the day of the visit. Professional time spent includes the following activities: Preparing to see the patient (review of tests), Obtaining and reviewing separately obtained history (Last Podiatry notes, Allergy notes ), Performing a medically appropriate examination and evaluation , Ordering medications,  Documenting clinical information in the EMR, Independently interpreting results (not separately reported), Communicating results to the patient/daughter, Counseling and educating the patient/daughter and Care coordination (not separately reported).   Of note, portions of this note may have been created with voice recognition software. While this note has been edited for accuracy, occasional wrong-word or 'sound-a-like' substitutions may have occurred due to the inherent limitations of voice recognition software.   Annalee Joseph, MD [facility] 11/28/2023, 3:43 PM

## 2023-11-28 NOTE — Telephone Encounter (Signed)
 Per Dr. Dea patient end date to stop Iv abx and pull picc is on 7/2. Sent message and called Holley Herring, RN at [facility] about orders.

## 2023-11-29 ENCOUNTER — Other Ambulatory Visit (HOSPITAL_COMMUNITY): Payer: Self-pay

## 2023-11-29 ENCOUNTER — Other Ambulatory Visit: Payer: Self-pay

## 2023-11-30 ENCOUNTER — Telehealth: Payer: Self-pay | Admitting: Family Medicine

## 2023-11-30 NOTE — Telephone Encounter (Signed)
 Copied from CRM [id number]. Topic: General - Other >> Nov 30, 2023  4:58 PM Donee H wrote: Reason for CRM: Deveon from [facility] called to report patient had a fall on November 29, 2023. There were no injuries and patient did not fall on head. Any questions can follow up with her at [phone]

## 2023-12-05 ENCOUNTER — Ambulatory Visit: Admitting: Allergy & Immunology

## 2023-12-05 ENCOUNTER — Other Ambulatory Visit: Payer: Self-pay

## 2023-12-05 ENCOUNTER — Other Ambulatory Visit: Payer: Self-pay | Admitting: Family Medicine

## 2023-12-05 MED ORDER — BLOOD GLUCOSE MONITOR SYSTEM W/DEVICE KIT
1.0000 | PACK | Freq: Every day | 0 refills | Status: DC
  Filled 2023-12-05: qty 1, 30d supply, fill #0

## 2023-12-06 ENCOUNTER — Other Ambulatory Visit: Payer: Self-pay

## 2023-12-06 ENCOUNTER — Other Ambulatory Visit: Payer: Self-pay | Admitting: Internal Medicine

## 2023-12-06 ENCOUNTER — Ambulatory Visit: Admitting: Cardiology

## 2023-12-06 ENCOUNTER — Other Ambulatory Visit: Payer: Self-pay | Admitting: Behavioral Health

## 2023-12-06 MED ORDER — DULOXETINE HCL 60 MG PO CPEP
60.0000 mg | ORAL_CAPSULE | Freq: Two times a day (BID) | ORAL | 0 refills | Status: DC
  Filled 2023-12-06: qty 180, 90d supply, fill #0

## 2023-12-07 ENCOUNTER — Other Ambulatory Visit: Payer: Self-pay

## 2023-12-07 ENCOUNTER — Other Ambulatory Visit (HOSPITAL_COMMUNITY): Payer: Self-pay

## 2023-12-07 ENCOUNTER — Other Ambulatory Visit: Payer: Self-pay | Admitting: Infectious Diseases

## 2023-12-07 MED ORDER — LISINOPRIL 10 MG PO TABS
10.0000 mg | ORAL_TABLET | Freq: Every day | ORAL | 0 refills | Status: DC
  Filled 2023-12-07: qty 90, 90d supply, fill #0

## 2023-12-08 ENCOUNTER — Ambulatory Visit (INDEPENDENT_AMBULATORY_CARE_PROVIDER_SITE_OTHER): Admitting: Podiatry

## 2023-12-08 ENCOUNTER — Encounter: Payer: Self-pay | Admitting: Podiatry

## 2023-12-08 DIAGNOSIS — M2042 Other hammer toe(s) (acquired), left foot: Secondary | ICD-10-CM | POA: Diagnosis not present

## 2023-12-08 DIAGNOSIS — E1142 Type 2 diabetes mellitus with diabetic polyneuropathy: Secondary | ICD-10-CM

## 2023-12-08 DIAGNOSIS — M2041 Other hammer toe(s) (acquired), right foot: Secondary | ICD-10-CM | POA: Diagnosis not present

## 2023-12-08 NOTE — Progress Notes (Unsigned)
 Chief Complaint  Patient presents with   Diabetic Ulcer    Right foot toe ulcer R2,R3. NIDDM A1C 6.4 Finished IV antibiotics switched to oral. 0 pain. Has neuropathy.     HPI: 63 y.o. male presents today following up for distal ulceration right third toe and preulcerative callus right second toe.  Does have hammertoe contractures bilaterally.  Has had prior flexor tenotomy right third toe which he is doing well with.  He has continued antibiotics and has been following infectious disease for this.  Past Medical History:  Diagnosis Date   Angio-edema    Anxiety    Claudication in peripheral vascular disease (HCC) 05/07/2022   Depression    Diabetes mellitus type II    Diabetic polyneuropathy associated with type 2 diabetes mellitus (HCC) 06/30/2018   GERD (gastroesophageal reflux disease)    History of kidney stones    HLD (hyperlipidemia)    HTN (hypertension)    Morbid obesity (HCC)    PAF (paroxysmal atrial fibrillation) (HCC) 08/31/2020   Paroxysmal atrial fibrillation (HCC)    Restless legs syndrome (RLS) 11/20/2012   Stroke (HCC) 02/14/2020   ischemic/right sided affected   Tobacco abuse     Past Surgical History:  Procedure Laterality Date   Arm surgery Right 1969   Abscess excision and debridement at elbow   COLONOSCOPY WITH PROPOFOL  N/A 09/06/2016   Procedure: COLONOSCOPY WITH PROPOFOL ;  Surgeon: Victory LITTIE Legrand DOUGLAS, MD;  Location: [facility];  Service: Gastroenterology;  Laterality: N/A;   I & D EXTREMITY Left 05/29/2014   Procedure: IRRIGATION AND DEBRIDEMENT OF LEFT LEG WOUND AND PLACEMENT OF  INTEGRA  AND  VAC;  Surgeon: Estefana Reichert, DO;  Location: [facility];  Service: Plastics;  Laterality: Left;   implantable loop recorder implant  03/06/2020   Medtronic Reveal [place] model LNQ11 (SN  [id number]) implanted by Dr Kelsie for cryptogenic stroke   INCISION AND DRAINAGE OF WOUND Left 04/23/2014   Procedure: IRRIGATION AND DEBRIDEMENT LOWER  LEFT LEG WOUND WITH PLACEMENT OF INTEGRA AND VAC;  Surgeon: Estefana Reichert, DO;  Location: [facility];  Service: Plastics;  Laterality: Left;   kidney stone removal  2004   MINOR APPLICATION OF WOUND VAC Left 04/23/2014   Procedure: MINOR APPLICATION OF WOUND VAC;  Surgeon: Estefana Reichert, DO;  Location: [facility];  Service: Plastics;  Laterality: Left;   SKIN SPLIT GRAFT Left 06/18/2014   Procedure: SKIN GRAFT SPLIT THICKNESS TO LOWER LEFT LEG WOUND WITH PLACEMENT OF VAC;  Surgeon: Estefana Reichert, DO;  Location: [facility];  Service: Plastics;  Laterality: Left;   VASECTOMY  1994    Allergies  Allergen Reactions   Cashew Nut Oil Anaphylaxis    Cashews, 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   Pistachio Nut (Diagnostic) Hives, Shortness Of Breath, Itching, Swelling and Rash   Latex Dermatitis   Tape Other (See Comments)    Tears and bruises the skin   Betadine [Povidone Iodine] Rash and Other (See Comments)    Raised rash 04/23/14   Lidocaine  Other (See Comments)    Minimal effect with lidocaine , prefers different anesthetic    ROS denies any nausea, vomiting, fever, chills, chest pain, shortness of breath   Physical Exam: There were no vitals filed for this visit.  General: The patient is alert and oriented x3 in no acute distress.  Dermatology: Minimal overlying callus right third toe distal tuft at site of prior ulceration without underlying ulcer  following debridement of the callus.  There is hemorrhagic callus present second toe distal tuft without ulceration present.  These are associated with hammertoe contractures.  The third toe is in improved rectus position from prior flexor tenotomy.  Vascular: Faintly palpable pedal pulses bilaterally. Capillary refill within normal limits.  No appreciable edema.  No erythema or calor.  Diminished pedal hair growth.  Neurological: Protective sensation decreased  Musculoskeletal Exam: Improved alignment right  third toe noted.  Charcot foot deformity noted.  There is hammertoe contractures that are semireducible bilaterally affecting the remaining lesser digits, most notably the right second toe which does have preulcerative callus.  Assessment/Plan of Care: 1. Hammertoe, bilateral   2. Diabetic polyneuropathy associated with type 2 diabetes mellitus (HCC)      No orders of the defined types were placed in this encounter.  None  Discussed clinical findings with patient today.  Right third toe appears well-healed.  Hemorrhagic callus of right second toe was sharply debrided using a 312 scalpel blade today as a courtesy, no underlying ulceration.  Would consider flexor tenotomy of the second toe if this does recur.  Encouraged regular use of the crest hammertoe pads to limit ulceration in the meantime. - Can follow-up as needed for this  Continue antibiotics per infectious disease for suspected osteomyelitis of right third toe distal tuft.  This does appear significantly improved and the ulceration has resolved.  Does have upcoming diabetic footcare appointment set.   Evony Rezek L. Lamount MAUL, AACFAS [facility]     [address] [place], [place] [address]                Office [phone]  Fax [phone]

## 2023-12-11 ENCOUNTER — Other Ambulatory Visit: Payer: Self-pay | Admitting: Infectious Diseases

## 2023-12-11 NOTE — Telephone Encounter (Signed)
 Patient should have completed course per provider's 7/1 note.   Catrina Fellenz, BSN, RN

## 2023-12-12 ENCOUNTER — Other Ambulatory Visit: Payer: Self-pay | Admitting: Infectious Diseases

## 2023-12-13 NOTE — Telephone Encounter (Signed)
 Per 7/1 note, patient only on doxy for 2 weeks. Should have completed course.   Marlowe Lawes, BSN, RN

## 2023-12-14 ENCOUNTER — Ambulatory Visit (INDEPENDENT_AMBULATORY_CARE_PROVIDER_SITE_OTHER): Admitting: Infectious Diseases

## 2023-12-14 ENCOUNTER — Encounter: Payer: Self-pay | Admitting: Infectious Diseases

## 2023-12-14 ENCOUNTER — Other Ambulatory Visit: Payer: Self-pay

## 2023-12-14 VITALS — BP 111/76 | HR 95 | Temp 97.8°F | Wt 285.4 lb

## 2023-12-14 DIAGNOSIS — M869 Osteomyelitis, unspecified: Secondary | ICD-10-CM | POA: Diagnosis not present

## 2023-12-14 DIAGNOSIS — E11628 Type 2 diabetes mellitus with other skin complications: Secondary | ICD-10-CM | POA: Diagnosis not present

## 2023-12-14 DIAGNOSIS — Z79899 Other long term (current) drug therapy: Secondary | ICD-10-CM | POA: Diagnosis not present

## 2023-12-14 DIAGNOSIS — L089 Local infection of the skin and subcutaneous tissue, unspecified: Secondary | ICD-10-CM

## 2023-12-14 NOTE — Progress Notes (Unsigned)
 Patient Active Problem List   Diagnosis Date Noted  . PICC (peripherally inserted central catheter) removal 11/28/2023  . Osteomyelitis of toe of right foot (HCC) 10/25/2023  . Needs peripherally inserted central catheter (PICC) 10/25/2023  . Penicillin allergy 10/04/2023  . PVD (peripheral vascular disease) (HCC) 10/04/2023  . Diabetic foot infection (HCC) 10/03/2023  . Osteomyelitis (HCC) 10/03/2023  . Medication management 10/03/2023  . Skin ulcer of third toe (HCC) 07/05/2023  . Claudication in peripheral vascular disease (HCC) 05/07/2022  . PAF (paroxysmal atrial fibrillation) (HCC) 08/31/2020  . Panic attacks 06/23/2020  . Aortic calcification (HCC) 02/22/2020  . Carotid artery calcification, bilateral 02/22/2020  . MDD (major depressive disorder), recurrent episode (HCC)   . GERD (gastroesophageal reflux disease)   . Ischemic stroke (HCC), occipital   . Diabetic polyneuropathy associated with type 2 diabetes mellitus (HCC) 06/30/2018  . Erectile dysfunction due to type 2 diabetes mellitus (HCC) 06/30/2018  . Circulation disorder of lower extremity 05/31/2015  . IBS (irritable bowel syndrome) 12/24/2014  . 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 Restless legs syndrome (RLS) 11/20/2012  . Generalized anxiety disorder   . Type 2 diabetes mellitus with vascular disease (HCC)   . Hyperlipidemia associated with type 2 diabetes mellitus (HCC) 01/16/2009  . TOBACCO ABUSE 07/27/2007  . Morbid obesity (HCC) 09/29/2006  . Essential hypertension 09/29/2006  . CALCULUS, KIDNEY 05/30/2002    Patient's Medications  New Prescriptions   No medications on file  Previous Medications   ACETAMINOPHEN  (TYLENOL ) 500 MG TABLET    Take 2 tablets (1,000 mg total) by mouth 2 (two) times daily as needed (for pain).   ALCOHOL  SWABS  (B-D SINGLE USE SWABS  REGULAR) PADS    Use to check blood sugar daily   BLOOD GLUCOSE MONITORING SUPPL (BLOOD GLUCOSE MONITOR  SYSTEM) W/DEVICE KIT    Use as directed once daily.   BUPROPION  (WELLBUTRIN  XL) 150 MG 24 HR TABLET    Take 1 tablet (150 mg total) by mouth daily.   CEFADROXIL  (DURICEF) 500 MG CAPSULE    Take 2 capsules (1,000 mg total) by mouth 2 (two) times daily for 15 days.   CETIRIZINE (ZYRTEC) 10 MG TABLET    Take 10 mg by mouth daily.   CLONAZEPAM  (KLONOPIN ) 0.5 MG TABLET    Take 1 tablet (0.5 mg total) by mouth 2 (two) times daily.   COVID-19 MRNA VACCINE, PFIZER, (COMIRNATY ) SYRINGE    Inject into the muscle.   DILTIAZEM  (CARDIZEM ) 30 MG TABLET    TAKE 1 TABLET BY MOUTH EVERY 4 HOURS AS NEEDED FOR HEART RATE>100.   DOXYCYCLINE  (VIBRA -TABS) 100 MG TABLET    Take 1 tablet (100 mg total) by mouth 2 (two) times daily for 15 days.   DULAGLUTIDE  (TRULICITY ) 4.5 MG/0.5ML SOAJ    Inject 4.5 mg into the skin once a week.   DULOXETINE  (CYMBALTA ) 30 MG CAPSULE    Take 2 capsules (60 mg total) by mouth daily AND 1 capsule (30 mg total) at bedtime.   DULOXETINE  (CYMBALTA ) 60 MG CAPSULE    Take 1 capsule (60 mg total) by mouth 2 (two) times daily.   EMPAGLIFLOZIN  (JARDIANCE ) 25 MG TABS TABLET    Take 1 tablet (25 mg total) by mouth daily before breakfast.   EPINEPHRINE  0.3 MG/0.3 ML IJ SOAJ INJECTION    Inject 0.3 mg into the muscle as needed for anaphylaxis.   FLUOXETINE  (PROZAC ) 40 MG CAPSULE    Take  2 capsules (80 mg total) by mouth daily.   GLUCOSE BLOOD (BLOOD GLUCOSE TEST STRIPS) STRP    Use as directed once daily.   HYDROCHLOROTHIAZIDE  (HYDRODIURIL ) 25 MG TABLET    Take 1 tablet (25 mg total) by mouth daily.   HYDROXYZINE (ATARAX) 10 MG TABLET    Take 10 mg by mouth 2 (two) times daily as needed.   LANCET DEVICE MISC    Use as directed in the morning, at noon, and at bedtime.   LANCETS MISC    Use as directed in the morning, at noon, and at bedtime.   LISINOPRIL  (ZESTRIL ) 10 MG TABLET    Take 1 tablet (10 mg total) by mouth daily.   METFORMIN  (GLUCOPHAGE -XR) 500 MG 24 HR TABLET    Take 4 tablets (2,000 mg  total) by mouth daily in the afternoon.   MULTIPLE VITAMIN (MULTIVITAMIN) TABLET    Take 1 tablet by mouth daily.   MUPIROCIN  OINTMENT (BACTROBAN ) 2 %    Apply 1 Application topically 2 (two) times daily.   OXYCODONE -ACETAMINOPHEN  (PERCOCET/ROXICET) 5-325 MG TABLET    Take 1 tablet by mouth every 6 (six) hours as needed for up to 12 doses for severe pain (pain score 7-10).   PANTOPRAZOLE  (PROTONIX ) 40 MG TABLET    Take 1 tablet (40 mg total) by mouth daily.   PENTOXIFYLLINE  (TRENTAL ) 400 MG CR TABLET    Take 1 tablet (400 mg total) by mouth 3 (three) times daily with meals.   PREGABALIN  (LYRICA ) 300 MG CAPSULE    TAKE 1 CAPSULE(300 MG) BY MOUTH TWICE DAILY   REPAGLINIDE  (PRANDIN ) 1 MG TABLET    Take 0.5 tablets (0.5 mg total) by mouth 2 (two) times daily before a meal.   ROPINIROLE  (REQUIP ) 0.5 MG TABLET    Take 1 tablet (0.5 mg total) by mouth 2 (two) times daily.   ROSUVASTATIN  (CRESTOR ) 20 MG TABLET    Take 1 tablet (20 mg total) by mouth at bedtime.   STUDY - LIBREXIA-AF - APIXABAN  5 MG OR PLACEBO CAPSULE (PI-SETHI)    Take 1 capsule (5 mg total) by mouth 2 (two) times daily. Take at approximately the same time of day with or without food. Please bring bottle back with you to every visit; do not discard bottle. Please contact [facility] for any questions or concerns regarding this medication. For Investigational Use Only.   STUDY - LIBREXIA-AF - [id number] (MILVEXIAN) 100 MG OR PLACEBO TABLET (PI-SETHI)    Take 1 tablet by mouth 2 (two) times daily. Take at approximately the same time of day with or without food. Please bring bottle back with you to every visit; do not discard bottle. Please contact [facility] for any questions or concerns regarding this medication. For Investigational Use Only.   TRIAMCINOLONE  CREAM (KENALOG ) 0.1 %    Apply 1 Application topically 2 (two) times daily.   TRUEPLUS LANCETS 33G MISC    Use to check blood sugar 2 times a day   Modified Medications   No medications on file  Discontinued Medications   No medications on file    Subjective: Discussed the use of AI scribe software for clinical note transcription with the patient, who gave verbal consent to proceed.   63 year old male with prior h/o DM2 w polyneuropathy, anxiety/depression, PVD, HTN, HLD, Kidney stones, Obesity, CVA w rt sided weakness, PAF, RLS and tobacco abuse who is referred from Podiatry for concerns for rt third toe osteomyelitis.  He  is accompanied by his daughter. He reports having a non-healing wound on the plantar surface of his rt middle toe, persisting for approximately six months after a callus was cut too deeply. The wound has shown slow improvement but with occasional serous drainage, noted as of this morning. He has completed two 21-day courses of doxycycline  over the past two months without full resolution of the wound, raising concerns for osteomyelitis.  Reports DM is well-controlled as well as has experienced ten strokes, resulting in cognitive impairment and bilateral hemiparesis, affecting his gait. He has restless leg syndrome and has undergone laser therapy for circulation issues.  He experiences nausea, attributed to Trulicity , and had a single episode of diarrhea. He denies fever, chills, vomiting, or other gastrointestinal symptoms.   He resides with his wife and daughter, who assist with his care, including wound dressing. He uses walls for support when ambulating indoors and requires additional support outdoors.  5/28 Taking PO doxyccyline. Discussed lab results from last visit as well as MRI findings. Seen by Dr Lamount on 5/15 and patient does not want amputation. Seen by Dr Gaynel on 5/20 for debridement of toe nails and callus. Discussed plan of care with daughter and wife over phonewith PICC line and IV antibiotics for osteomyelitis. No other concerns.   7/1 Currently getting IV daptomycin through PICC and taking PO  ciprofloxacin , no concerns with antibiotics. Reports he feels uncomfortable with the tape of the PICC and thinks allergic. Seen by allergist and has passed the challenge test. Last seen by Podiatry on 6/20- debridement of callus at rt third toe, also noted to have preulcerative callus of rt second toe. He reports while walking toe gets curled up and the it causes friction in the second toe and has been using protective coverings. Denies fevers, chills, nausea, vomiting and diarrhea. No other complaints.    7/17 Completed antibiotics today. Healed.   Review of Systems: all systems reviewed with pertinent positives and negatives as listed above  Past Medical History:  Diagnosis Date  . Angio-edema   . Anxiety   . Claudication in peripheral vascular disease (HCC) 05/07/2022  . Depression   . Diabetes mellitus type II   . Diabetic polyneuropathy associated with type 2 diabetes mellitus (HCC) 06/30/2018  . GERD (gastroesophageal reflux disease)   . History of kidney stones   . HLD (hyperlipidemia)   . HTN (hypertension)   . Morbid obesity (HCC)   . PAF (paroxysmal atrial fibrillation) (HCC) 08/31/2020  . Paroxysmal atrial fibrillation (HCC)   . Restless legs syndrome (RLS) 11/20/2012  . Stroke (HCC) 02/14/2020   ischemic/right sided affected  . Tobacco abuse    Past Surgical History:  Procedure Laterality Date  . Arm surgery Right 1969   Abscess excision and debridement at elbow  . COLONOSCOPY WITH PROPOFOL  N/A 09/06/2016   Procedure: COLONOSCOPY WITH PROPOFOL ;  Surgeon: Victory LITTIE Legrand DOUGLAS, MD;  Location: [facility];  Service: Gastroenterology;  Laterality: N/A;  . I & D EXTREMITY Left 05/29/2014   Procedure: IRRIGATION AND DEBRIDEMENT OF LEFT LEG WOUND AND PLACEMENT OF  INTEGRA  AND  VAC;  Surgeon: Estefana Reichert, DO;  Location: [facility];  Service: Plastics;  Laterality: Left;  . implantable loop recorder implant  03/06/2020   Medtronic Reveal [place] model LNQ11 (SN   [id number]) implanted by Dr Kelsie for cryptogenic stroke  . INCISION AND DRAINAGE OF WOUND Left 04/23/2014   Procedure: IRRIGATION AND DEBRIDEMENT LOWER LEFT LEG WOUND WITH PLACEMENT OF INTEGRA  AND VAC;  Surgeon: Estefana Reichert, DO;  Location: [facility];  Service: Plastics;  Laterality: Left;  . kidney stone removal  2004  . MINOR APPLICATION OF WOUND VAC Left 04/23/2014   Procedure: MINOR APPLICATION OF WOUND VAC;  Surgeon: Estefana Reichert, DO;  Location: [facility];  Service: Plastics;  Laterality: Left;  . SKIN SPLIT GRAFT Left 06/18/2014   Procedure: SKIN GRAFT SPLIT THICKNESS TO LOWER LEFT LEG WOUND WITH PLACEMENT OF VAC;  Surgeon: Estefana Reichert, DO;  Location: [facility];  Service: Plastics;  Laterality: Left;  SABRA VASECTOMY  1994     Social History   Tobacco Use  . Smoking status: Every Day    Types: E-cigarettes    Passive exposure: Never  . Smokeless tobacco: Never  . Tobacco comments:    Not smoking current /    Patient states he vapes - 10/25/2023  Vaping Use  . Vaping status: Every Day  . Substances: Nicotine, Flavoring  Substance Use Topics  . Alcohol  use: Not Currently    Alcohol /week: 0.0 standard drinks of alcohol     Comment: rarely  . Drug use: No    Family History  Problem Relation Age of Onset  . Depression Mother   . Anxiety disorder Mother   . Stroke Mother        multiple  . Leukemia Mother   . Pneumonia Mother   . Cancer Mother   . Diabetes Mother   . Diabetes Father   . Heart disease Father   . Hyperlipidemia Father   . Hypertension Father   . Allergic rhinitis Neg Hx   . Angioedema Neg Hx   . Asthma Neg Hx   . Eczema Neg Hx   . Immunodeficiency Neg Hx   . Urticaria Neg Hx     Allergies  Allergen Reactions  . Cashew Nut Oil Anaphylaxis    Cashews, 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  SABRA Pistachio Nut (Diagnostic) Hives, Shortness Of Breath, Itching, Swelling and Rash  . Latex Dermatitis  . Tape Other (See  Comments)    Tears and bruises the skin  . Betadine [Povidone Iodine] Rash and Other (See Comments)    Raised rash 04/23/14  . Lidocaine  Other (See Comments)    Minimal effect with lidocaine , prefers different anesthetic    Health Maintenance  Topic Date Due  . INFLUENZA VACCINE  12/29/2023  . HEMOGLOBIN A1C  01/18/2024  . COVID-19 Vaccine (5 - 2024-25 season) 03/02/2024  . FOOT EXAM  07/06/2024  . Diabetic kidney evaluation - Urine ACR  07/20/2024  . Medicare Annual Wellness (AWV)  08/29/2024  . Diabetic kidney evaluation - eGFR measurement  10/02/2024  . OPHTHALMOLOGY EXAM  10/29/2024  . DTaP/Tdap/Td (2 - Td or Tdap) 04/20/2026  . Colonoscopy  09/07/2026  . Pneumococcal Vaccine 24-43 Years old  Completed  . Hepatitis C Screening  Completed  . HIV Screening  Completed  . Zoster Vaccines- Shingrix   Completed  . Hepatitis B Vaccines  Aged Out  . HPV VACCINES  Aged Out  . Meningococcal B Vaccine  Aged Out    Objective: Wt 285 lb 6.4 oz (129.5 kg)   BMI 41.32 kg/m    Physical Exam Constitutional:      Appearance: Normal appearance. Morbidly obese  HENT:     Head: Normocephalic and atraumatic.      Mouth: Mucous membranes are moist.  Eyes:    Conjunctiva/sclera: Conjunctivae normal.     Pupils: Pupils are equal, round,  and b/l symmetrical    Cardiovascular:     Rate and Rhythm: Normal rate and Irregular rhythm.     Heart sounds:  Pulmonary:     Effort: Pulmonary effort is normal.     Breath sounds:   Abdominal:     General: Non distended     Palpations:   Musculoskeletal:        General: ambulatory with support   Skin:    General: Skin is warm and dry.     Comments: PICC line in rt arm OK with no signs of infection or allergic rashes   Rt third toe wound almost healed, no signs of infection   RT second toe with preulcerative callus, no signs of infection.    Neurological:     General:     Mental Status: awake, alert and oriented to person, place, and  time.   Psychiatric:        Mood and Affect: Mood normal.   Lab Results Lab Results  Component Value Date   WBC 8.0 10/03/2023   HGB 15.9 10/03/2023   HCT 49.5 10/03/2023   MCV 88.1 10/03/2023   PLT 211 10/03/2023    Lab Results  Component Value Date   CREATININE 1.29 10/03/2023   BUN 20 10/03/2023   NA 139 10/03/2023   K 4.3 10/03/2023   CL 102 10/03/2023   CO2 30 10/03/2023    Lab Results  Component Value Date   ALT 17 10/03/2023   AST 15 10/03/2023   ALKPHOS 32 (L) 07/21/2023   BILITOT 0.6 10/03/2023    Lab Results  Component Value Date   CHOL 104 07/21/2023   HDL 35.50 (L) 07/21/2023   LDLCALC 27 07/21/2023   LDLDIRECT 76 02/19/2014   TRIG 205.0 (H) 07/21/2023   CHOLHDL 3 07/21/2023   No results found for: LABRPR, RPRTITER No results found for: HIV1RNAQUANT, HIV1RNAVL, CD4TABS   Imaging No results found.   Imaging per podiatry note Right foot 3 views weightbearing 09/14/2023 Joint spaces preserved.  On close review of imaging from February, there are signs of very mild osteolysis and cortical irregularities at the distal third toe.  This does appear relatively stable from films from 3/20 and from 4/3  Assessment/Plan # Rt third toe osteomyelitis  - on prolonged course of PO doxycycline > PO ciprofloxacin  added 5/28 viist until IV antibiotics started on 6/4 as below - IV daptomycin and ceftriaxone from 6/4 -  till date   Plan - DC PICC line tomorrow, start PO doxycycline  and cefadroxil  for 2 weeks as ordered to complete 6 weeks course  - fu in 2 weeks   # Medication management  - labs from 6/30 discussed, CBC and CMP unremarkable, CR 1.55 with normal ESR and CRP, CK 309  # RT second toe pre-ulcerative callus - discussed to wear protective shoes and padding  # DM  - BG control  - Fu with PCP  # Penicillin allergy  - Has passed challenge and allergy removed   I spent 26 minutes involved in face-to-face and non-face-to-face activities  for this patient on the day of the visit. Professional time spent includes the following activities: Preparing to see the patient (review of tests), Obtaining and reviewing separately obtained history (Last Podiatry notes, Allergy notes ), Performing a medically appropriate examination and evaluation , Ordering medications,  Documenting clinical information in the EMR, Independently interpreting results (not separately reported), Communicating results to the patient/daughter, Counseling and educating the patient/daughter and Care coordination (not  separately reported).   Of note, portions of this note may have been created with voice recognition software. While this note has been edited for accuracy, occasional wrong-word or 'sound-a-like' substitutions may have occurred due to the inherent limitations of voice recognition software.   [name] Joseph, MD [facility] 12/14/2023, 2:38 PM

## 2023-12-15 ENCOUNTER — Other Ambulatory Visit (HOSPITAL_COMMUNITY): Payer: Self-pay

## 2023-12-25 ENCOUNTER — Other Ambulatory Visit: Payer: Self-pay | Admitting: Family Medicine

## 2023-12-25 ENCOUNTER — Other Ambulatory Visit (HOSPITAL_COMMUNITY): Payer: Self-pay

## 2023-12-25 ENCOUNTER — Other Ambulatory Visit: Payer: Self-pay | Admitting: Behavioral Health

## 2023-12-25 DIAGNOSIS — E1159 Type 2 diabetes mellitus with other circulatory complications: Secondary | ICD-10-CM

## 2023-12-25 MED ORDER — ACETAMINOPHEN 500 MG PO TABS
1000.0000 mg | ORAL_TABLET | Freq: Two times a day (BID) | ORAL | 3 refills | Status: DC | PRN
  Filled 2023-12-25: qty 120, 30d supply, fill #0
  Filled 2024-02-13: qty 120, 30d supply, fill #1
  Filled 2024-02-19 – 2024-03-12 (×3): qty 120, 30d supply, fill #2

## 2023-12-25 MED ORDER — FLUOXETINE HCL 40 MG PO CAPS
80.0000 mg | ORAL_CAPSULE | Freq: Every day | ORAL | 0 refills | Status: DC
  Filled 2023-12-25: qty 180, 90d supply, fill #0

## 2023-12-25 MED ORDER — TRUEPLUS LANCETS 33G MISC
3 refills | Status: AC
  Filled 2024-01-01 – 2024-02-20 (×3): qty 200, 100d supply, fill #0
  Filled 2024-06-26: qty 200, 100d supply, fill #1

## 2023-12-25 NOTE — Telephone Encounter (Signed)
 Last office visit 04/02/025 for CPE.  Last refilled 08/24/2023 for #180 with 1 refill.  Next appt: 02/29/2024 for 6 month follow up.

## 2023-12-26 ENCOUNTER — Other Ambulatory Visit: Payer: Self-pay

## 2023-12-26 ENCOUNTER — Ambulatory Visit: Admitting: Podiatry

## 2023-12-26 ENCOUNTER — Encounter: Payer: Self-pay | Admitting: Podiatry

## 2023-12-26 DIAGNOSIS — B351 Tinea unguium: Secondary | ICD-10-CM

## 2023-12-26 DIAGNOSIS — M205X1 Other deformities of toe(s) (acquired), right foot: Secondary | ICD-10-CM | POA: Diagnosis not present

## 2023-12-26 DIAGNOSIS — Z8631 Personal history of diabetic foot ulcer: Secondary | ICD-10-CM

## 2023-12-26 DIAGNOSIS — E1142 Type 2 diabetes mellitus with diabetic polyneuropathy: Secondary | ICD-10-CM | POA: Diagnosis not present

## 2023-12-26 DIAGNOSIS — M79676 Pain in unspecified toe(s): Secondary | ICD-10-CM

## 2023-12-26 DIAGNOSIS — E11610 Type 2 diabetes mellitus with diabetic neuropathic arthropathy: Secondary | ICD-10-CM | POA: Diagnosis not present

## 2023-12-26 DIAGNOSIS — I739 Peripheral vascular disease, unspecified: Secondary | ICD-10-CM

## 2023-12-26 MED ORDER — PREGABALIN 300 MG PO CAPS: 300.0000 mg | ORAL_CAPSULE | Freq: Two times a day (BID) | ORAL | 1 refills | Status: DC

## 2023-12-28 ENCOUNTER — Other Ambulatory Visit: Payer: Self-pay | Admitting: Family Medicine

## 2023-12-29 ENCOUNTER — Encounter: Payer: Self-pay | Admitting: Podiatry

## 2023-12-29 NOTE — Progress Notes (Signed)
Please see attached. Thank you

## 2023-12-29 NOTE — Progress Notes (Signed)
 Subjective:  Patient ID: [name] [name]., male    DOB: 09/05/1960,  MRN: [id number]  [name] [name]. presents to clinic today for at risk footcare. Patient has h/o diabetes, neuropathy and PAD and is seen for  and painful thick toenails that are difficult to trim. Pain interferes with ambulation. Aggravating factors include wearing enclosed shoe gear. Pain is relieved with periodic professional debridement. He is accompanied by his daughter on today's visit. He has h/o osteomyelitis of right 3rd digit which has healed. Chief Complaint  Patient presents with   RFC    Rm16  Diabetic foot care/ A1c 6.6/ Dr. Watt last visit April 2025   New problem(s): None.   PCP is Copland, Jacques, MD.  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   Pistachio Nut (Diagnostic) Hives, Shortness Of Breath, Itching, Swelling and Rash   Latex Dermatitis   Tape Other (See Comments)    Tears and bruises the skin   Betadine [Povidone Iodine] Rash and Other (See Comments)    Raised rash 04/23/14   Lidocaine  Other (See Comments)    Minimal effect with lidocaine , prefers different anesthetic    Review of Systems: Negative except as noted in the HPI.  Objective: No changes noted in today's physical examination. There were no vitals filed for this visit. [name] [name]. is a pleasant 63 y.o. male obese in NAD. AAO x 3.  Vascular Examination: Capillary refill time immediate b/l. Palpable pedal pulses. Pedal hair present b/l. No edema b/l. No pain with calf compression b/l. Skin temperature gradient WNL b/l. No cyanosis or clubbing b/l. No ischemia or gangrene noted b/l. Evidence of chronic venous insufficiency b/l LE.  Neurological Examination: Pt has subjective symptoms of neuropathy. Protective sensation diminished with 10g monofilament b/l.  Dermatological Examination: Pedal skin with normal turgor, texture and tone b/l.  No open wounds. No  interdigital macerations.   Toenails 1-5 b/l thick, discolored, elongated with subungual debris and pain on dorsal palpation.   No corns, calluses, nor porokeratotic lesions.  Musculoskeletal Examination: Muscle strength 5/5 to all lower extremity muscle groups bilaterally. No pain, crepitus or joint limitation noted with ROM bilateral LE. Hammertoe deformity noted 2-5 b/l. Charcot deformity right foot.  Radiographs: None  Last A1c:      Latest Ref Rng & Units 07/21/2023    8:42 AM 07/07/2023    9:37 AM 03/21/2023    3:14 PM  Hemoglobin A1C  Hemoglobin-A1c 4.6 - 6.5 % 6.6  6.8  7.8    Assessment/Plan: Orders Placed This Encounter  Procedures   For home use only DME Other see comment    To Orthotics and Prosthetics: Dispense one pair extra depth shoes and 3 pair custom molded insoles.    Length of Need:   12 Months    1. Pain due to onychomycosis of toenail   2. Personal history of diabetic foot ulcer   3. PAD (peripheral artery disease) (HCC)   4. Diabetic Charcot's foot (HCC)   5. Acquired claw toe of right foot   6. Diabetic polyneuropathy associated with type 2 diabetes mellitus (HCC)     -Patient was evaluated today. All questions/concerns addressed on today's visit. -Patient's family member present. All questions/concerns addressed on today's visit. -Order written for one pair extra depth shoes and 3 pair custom molded insoles. -Patient to continue soft, supportive shoe gear daily. -Mycotic toenails 1-5 bilaterally were debrided in length and girth with sterile nail  nippers and dremel without incident. -Continue padding to afftected digit(s) daily for protection. -Patient/POA to call should there be question/concern in the interim.   Return in about 9 weeks (around 02/27/2024).  Delon LITTIE Merlin, DPM      [facility] LOCATION: [address], [place] [address]                   Office [phone]    [facility] LOCATION: [address] [place], [place] [address] Office [phone]

## 2024-01-01 ENCOUNTER — Other Ambulatory Visit: Payer: Self-pay | Admitting: Infectious Diseases

## 2024-01-01 ENCOUNTER — Other Ambulatory Visit (HOSPITAL_BASED_OUTPATIENT_CLINIC_OR_DEPARTMENT_OTHER): Payer: Self-pay

## 2024-01-01 ENCOUNTER — Other Ambulatory Visit: Payer: Self-pay | Admitting: Family Medicine

## 2024-01-01 NOTE — Telephone Encounter (Signed)
 Per 7/17 note, patient has stopped abx

## 2024-01-02 ENCOUNTER — Other Ambulatory Visit: Payer: Self-pay

## 2024-01-04 NOTE — Progress Notes (Unsigned)
 Name: [name] [name].  Age/ Sex: 64 y.o., male   MRN/ DOB: [id number], Apr 28, 1961     PCP: Watt Mirza, MD   Reason for Endocrinology Evaluation: Type 2 Diabetes Mellitus  Initial Endocrine Consultative Visit: 03/01/2019    PATIENT IDENTIFIER: [name]. is a 63 y.o. male with a past medical history of HTN, T2DM,Hx CVA. The patient has followed with Endocrinology clinic since 03/01/2019 for consultative assistance with management of his diabetes.  DIABETIC HISTORY:  [name] was diagnosed with DM 2010, patient intolerant to bromocriptine . His hemoglobin A1c has ranged from 5.9% in 2022, peaking at 10.7% in 2020.   The patient has been followed up with Dr. Kassie between 2020 until April 2023  Stopped Farxiga  07/2022 due to cost issues.  Patient did not qualify for patient assistance  Off Trulicity  and SGLT2 inhibitors by 10/2022 but restarted 05/2023  SUBJECTIVE:   During the last visit (07/07/2023): A1c 6.8%   Today (01/05/2024): [name] is here for follow-up on diabetes management. He is accompanied by his spouse and daughter [name] [name] today. He checks his blood sugars 1 times daily. The patient has not had hypoglycemic episodes since the last clinic visit.       Follow-up with podiatry 11/2023 Patient follows with ID, he was on IV daptomycin through PICC line as well as oral antibiotics for right third toe osteomyelitis which have healed and antibiotics discontinued  Had a follow-up with cardiology for paroxysmal A-fib, PAD and HTN    He had gastritis last week but this resolved with mainly diarrhea and vomiting  No fever   HOME DIABETES REGIMEN:  Metformin  500 mg XR 4 tabs daily  Repaglinide  1 mg , half a tablet BID- 1 tablet before dinner  Jardiance  25 mg daily  Trulicity  4.5 mg weekly    Statin: Yes ACE-I/ARB: Yes   METER DOWNLOAD SUMMARY: n/a    DIABETIC COMPLICATIONS: Microvascular complications:  Neuropathy Denies: CKD Last Eye Exam:  Completed 10/30/2023  Macrovascular complications:  CVA Denies: CAD, PVD   HISTORY:  Past Medical History:  Past Medical History:  Diagnosis Date   Angio-edema    Anxiety    Claudication in peripheral vascular disease (HCC) 05/07/2022   Depression    Diabetes mellitus type II    Diabetic polyneuropathy associated with type 2 diabetes mellitus (HCC) 06/30/2018   GERD (gastroesophageal reflux disease)    History of kidney stones    HLD (hyperlipidemia)    HTN (hypertension)    Morbid obesity (HCC)    PAF (paroxysmal atrial fibrillation) (HCC) 08/31/2020   Paroxysmal atrial fibrillation (HCC)    Restless legs syndrome (RLS) 11/20/2012   Stroke (HCC) 02/14/2020   ischemic/right sided affected   Tobacco abuse    Past Surgical History:  Past Surgical History:  Procedure Laterality Date   Arm surgery Right 1969   Abscess excision and debridement at elbow   COLONOSCOPY WITH PROPOFOL  N/A 09/06/2016   Procedure: COLONOSCOPY WITH PROPOFOL ;  Surgeon: Victory LITTIE Legrand DOUGLAS, MD;  Location: [facility];  Service: Gastroenterology;  Laterality: N/A;   I & D EXTREMITY Left 05/29/2014   Procedure: IRRIGATION AND DEBRIDEMENT OF LEFT LEG WOUND AND PLACEMENT OF  INTEGRA  AND  VAC;  Surgeon: Estefana Reichert, DO;  Location: [facility];  Service: Plastics;  Laterality: Left;   implantable loop recorder implant  03/06/2020   Medtronic Reveal [place] model LNQ11 (SN  [id number]) implanted by Dr Kelsie for cryptogenic stroke   INCISION AND  DRAINAGE OF WOUND Left 04/23/2014   Procedure: IRRIGATION AND DEBRIDEMENT LOWER LEFT LEG WOUND WITH PLACEMENT OF INTEGRA AND VAC;  Surgeon: Estefana Reichert, DO;  Location: [facility];  Service: Plastics;  Laterality: Left;   kidney stone removal  2004   MINOR APPLICATION OF WOUND VAC Left 04/23/2014   Procedure: MINOR APPLICATION OF WOUND VAC;  Surgeon: Estefana Reichert, DO;  Location: [facility];  Service: Plastics;  Laterality: Left;    SKIN SPLIT GRAFT Left 06/18/2014   Procedure: SKIN GRAFT SPLIT THICKNESS TO LOWER LEFT LEG WOUND WITH PLACEMENT OF VAC;  Surgeon: Estefana Reichert, DO;  Location:  SURGERY CENTER;  Service: Plastics;  Laterality: Left;   VASECTOMY  1994   Social History:  reports that he has been smoking e-cigarettes. He has never been exposed to tobacco smoke. He has never used smokeless tobacco. He reports that he does not currently use alcohol . He reports that he does not use drugs. Family History:  Family History  Problem Relation Age of Onset   Depression Mother    Anxiety disorder Mother    Stroke Mother        multiple   Leukemia Mother    Pneumonia Mother    Cancer Mother    Diabetes Mother    Diabetes Father    Heart disease Father    Hyperlipidemia Father    Hypertension Father    Allergic rhinitis Neg Hx    Angioedema Neg Hx    Asthma Neg Hx    Eczema Neg Hx    Immunodeficiency Neg Hx    Urticaria Neg Hx      HOME MEDICATIONS: Allergies as of 01/05/2024       Reactions   Cashew Nut Oil Anaphylaxis   Cashews, 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   Pistachio Nut (diagnostic) Hives, Shortness Of Breath, Itching, Swelling, Rash   Latex Dermatitis   Tape Other (See Comments)   Tears and bruises the skin   Betadine [povidone Iodine] Rash, Other (See Comments)   Raised rash 04/23/14   Lidocaine  Other (See Comments)   Minimal effect with lidocaine , prefers different anesthetic        Medication List        Accurate as of January 05, 2024 11:49 AM. If you have any questions, ask your nurse or doctor.          Acetaminophen  Extra Strength 500 MG Tabs Take 2 tablets (1,000 mg total) by mouth 2 (two) times daily as needed (for pain).   buPROPion  150 MG 24 hr tablet Commonly known as: WELLBUTRIN  XL Take 1 tablet (150 mg total) by mouth daily.   cetirizine 10 MG tablet Commonly known as: ZYRTEC Take 10 mg by mouth daily.   clonazePAM  0.5 MG tablet Commonly known as:  KLONOPIN  Take 1 tablet (0.5 mg total) by mouth 2 (two) times daily.   Comirnaty  syringe Generic drug: COVID-19 mRNA vaccine (Pfizer) Inject into the muscle.   Daily-Vite Multivitamin Tabs Take 1 tablet by mouth daily.   diltiazem  30 MG tablet Commonly known as: CARDIZEM  TAKE 1 TABLET BY MOUTH EVERY 4 HOURS AS NEEDED FOR HEART RATE>100.   doxycycline  100 MG tablet Commonly known as: VIBRA -TABS Take 1 tablet (100 mg total) by mouth 2 (two) times daily for 15 days.   DULoxetine  30 MG capsule Commonly known as: CYMBALTA  Take 2 capsules (60 mg total) by mouth daily AND 1 capsule (30 mg total) at bedtime.   DULoxetine  60 MG capsule Commonly known  as: CYMBALTA  Take 1 capsule (60 mg total) by mouth 2 (two) times daily.   Easy Touch Alcohol  Prep Medium 70 % Pads Use to check blood sugar daily   EPINEPHrine  0.3 mg/0.3 mL Soaj injection Commonly known as: EPI-PEN Inject 0.3 mg into the muscle as needed for anaphylaxis.   FLUoxetine  40 MG capsule Commonly known as: PROZAC  Take 2 capsules (80 mg total) by mouth daily.   hydrochlorothiazide  25 MG tablet Commonly known as: HYDRODIURIL  Take 1 tablet (25 mg total) by mouth daily.   hydrOXYzine 10 MG tablet Commonly known as: ATARAX Take 10 mg by mouth 2 (two) times daily as needed.   Jardiance  25 MG Tabs tablet Generic drug: empagliflozin  Take 1 tablet (25 mg total) by mouth daily before breakfast.   LIBREXIA-AF apixaban  or placebo 5 mg Caps capsule Take 1 capsule (5 mg total) by mouth 2 (two) times daily. Take at approximately the same time of day with or without food. Please bring bottle back with you to every visit; do not discard bottle. Please contact [facility] for any questions or concerns regarding this medication. For Investigational Use Only.   LIBREXIA-AF [id number] (Milvexian) or placebo 100 mg tablet Take 1 tablet by mouth 2 (two) times daily. Take at approximately the same time of day with or  without food. Please bring bottle back with you to every visit; do not discard bottle. Please contact [facility] for any questions or concerns regarding this medication. For Investigational Use Only.   lisinopril  10 MG tablet Commonly known as: ZESTRIL  Take 1 tablet (10 mg total) by mouth daily.   metFORMIN  500 MG 24 hr tablet Commonly known as: GLUCOPHAGE -XR Take 4 tablets (2,000 mg total) by mouth daily in the afternoon.   mupirocin  ointment 2 % Commonly known as: BACTROBAN  Apply 1 Application topically 2 (two) times daily.   ONE TOUCH ULTRA 2 w/Device Kit Use as directed once daily.   OneTouch Delica Plus Lancet33G Misc Use as directed in the morning, at noon, and at bedtime.   TRUEplus Lancets 33G Misc Use to check blood sugar 2 times a day   OneTouch Delica Plus Lancing Misc Use as directed in the morning, at noon, and at bedtime.   OneTouch Ultra Test test strip Generic drug: glucose blood Use as directed once daily.   oxyCODONE -acetaminophen  5-325 MG tablet Commonly known as: PERCOCET/ROXICET Take 1 tablet by mouth every 6 (six) hours as needed for up to 12 doses for severe pain (pain score 7-10).   pantoprazole  40 MG tablet Commonly known as: PROTONIX  Take 1 tablet (40 mg total) by mouth daily.   pentoxifylline  400 MG CR tablet Commonly known as: TRENTAL  Take 1 tablet (400 mg total) by mouth 3 (three) times daily with meals.   pregabalin  300 MG capsule Commonly known as: LYRICA  TAKE 1 CAPSULE(300 MG) BY MOUTH TWICE DAILY   repaglinide  1 MG tablet Commonly known as: PRANDIN  Take 0.5 tablets (0.5 mg total) by mouth 2 (two) times daily before a meal. What changed: when to take this   rOPINIRole  0.5 MG tablet Commonly known as: REQUIP  Take 1 tablet (0.5 mg total) by mouth 2 (two) times daily.   rosuvastatin  20 MG tablet Commonly known as: CRESTOR  Take 1 tablet (20 mg total) by mouth at bedtime.   triamcinolone  cream 0.1 % Commonly known  as: KENALOG  Apply 1 Application topically 2 (two) times daily.   Trulicity  4.5 MG/0.5ML Soaj Generic drug: Dulaglutide  Inject 4.5 mg into the skin  once a week.         OBJECTIVE:   Vital Signs: BP 110/70 (BP Location: Left Arm, Patient Position: Sitting, Cuff Size: Normal)   Pulse 85   Ht 5' 9.69 (1.77 m)   Wt 281 lb (127.5 kg)   SpO2 98%   BMI 40.68 kg/m   Wt Readings from Last 3 Encounters:  01/05/24 281 lb (127.5 kg)  12/14/23 285 lb 6.4 oz (129.5 kg)  11/28/23 288 lb (130.6 kg)     Exam: General: Pt appears well and is in NAD  Neck: General: Supple without adenopathy. Thyroid : Thyroid  size normal.  No goiter or nodules appreciated.   Lungs: Clear with good BS bilat   Heart: RRR   Extremities: B/L stasis dermatitis   Neuro: MS is good with appropriate affect, pt is alert and Ox3    DM foot exam: 07/05/2023 per podiatry    DATA REVIEWED:  Lab Results  Component Value Date   HGBA1C 6.4 (A) 01/05/2024   HGBA1C 6.6 (H) 07/21/2023   HGBA1C 6.8 (A) 07/07/2023      Latest Reference Range & Units 10/03/23 13:57  Sodium 135 - 146 mmol/L 139  Potassium 3.5 - 5.3 mmol/L 4.3  Chloride 98 - 110 mmol/L 102  CO2 20 - 32 mmol/L 30  Glucose 65 - 99 mg/dL 878 (H)  BUN 7 - 25 mg/dL 20  Creatinine 9.29 - 8.64 mg/dL 8.70  Calcium  8.6 - 10.3 mg/dL 89.7  BUN/Creatinine Ratio 6 - 22 (calc) SEE NOTE:  eGFR > OR = 60 mL/min/1.18m2 63  AG Ratio 1.0 - 2.5 (calc) 2.4  AST 10 - 35 U/L 15  ALT 9 - 46 U/L 17  Total Protein 6.1 - 8.1 g/dL 6.7  Total Bilirubin 0.2 - 1.2 mg/dL 0.6     In office BG 884 mg/dL    Old records , labs and images have been reviewed.    ASSESSMENT / PLAN / RECOMMENDATIONS:   1) Type 2 Diabetes Mellitus,Optimally controlled, With neuropathic and macrovascular complications - Most recent A1c of 6.4%. Goal A1c < 7.0 %.    -A1c remains optimal at 6.4% - No glucose data today, no reported hypoglycemia - No changes at this  time  MEDICATIONS: Continue repaglinide  1 mg , 1 tablet before supper Continue Metformin  500 mg XR , 4 tablets daily  Continue Jardiance  25 mg daily Continue Trulicity  4.5 mg weekly   EDUCATION / INSTRUCTIONS: BG monitoring instructions: Patient is instructed to check his blood sugars 1 times a day, fasting . Call [facility] if: BG persistently < 70  I reviewed the Rule of 15 for the treatment of hypoglycemia in detail with the patient. Literature supplied.    2) Diabetic complications:  Eye: Does not have known diabetic retinopathy.  Neuro/ Feet: Does  have known diabetic peripheral neuropathy .  Renal: Patient does not have known baseline CKD. He   is  on an ACEI/ARB at present.    3)Dyslipidemia:  - LDL has been at goal in the past with slightly elevated TG - No change  Medication Continue rosuvastatin  20 mg daily   F/U 6 months  I spent 25 minutes preparing to see the patient by review of recent labs, imaging and procedures, obtaining and reviewing separately obtained history, communicating with the patient/family or caregiver, ordering medications, tests or procedures, and documenting clinical information in the EHR including the differential Dx, treatment, and any further evaluation and other management   Signed electronically by:  Abby Redgie Butts, MD  [facility]  [facility] [address] [place], [place] [address] Phone: [phone] FAX: [phone]   CC: Watt Mirza, MD [address] [place] [address] Phone: [phone]  Fax: [phone]  Return to Endocrinology clinic as below: Future Appointments  Date Time Provider Department Center  01/05/2024 11:50 AM Daryn Pisani, Donell Redgie, MD [facility] None  02/12/2024 11:00 AM Teresa Redell LABOR, NP [facility] None  02/29/2024  2:00 PM Watt Mirza, MD [facility]  03/04/2024  3:15 PM Gaynel Delon CROME, DPM [facility]   03/15/2024 10:30 AM Whitfield Raisin, NP [facility] None  06/04/2024  3:15 PM Gaynel Delon CROME, DPM [facility]

## 2024-01-05 ENCOUNTER — Other Ambulatory Visit (HOSPITAL_COMMUNITY): Payer: Self-pay

## 2024-01-05 ENCOUNTER — Other Ambulatory Visit: Payer: Self-pay

## 2024-01-05 ENCOUNTER — Encounter: Payer: Self-pay | Admitting: Internal Medicine

## 2024-01-05 ENCOUNTER — Ambulatory Visit: Admitting: Internal Medicine

## 2024-01-05 ENCOUNTER — Ambulatory Visit: Payer: HMO | Admitting: Internal Medicine

## 2024-01-05 VITALS — BP 110/70 | HR 85 | Ht 69.69 in | Wt 281.0 lb

## 2024-01-05 DIAGNOSIS — E1159 Type 2 diabetes mellitus with other circulatory complications: Secondary | ICD-10-CM | POA: Diagnosis not present

## 2024-01-05 DIAGNOSIS — E1142 Type 2 diabetes mellitus with diabetic polyneuropathy: Secondary | ICD-10-CM | POA: Diagnosis not present

## 2024-01-05 DIAGNOSIS — Z7984 Long term (current) use of oral hypoglycemic drugs: Secondary | ICD-10-CM | POA: Diagnosis not present

## 2024-01-05 DIAGNOSIS — Z7985 Long-term (current) use of injectable non-insulin antidiabetic drugs: Secondary | ICD-10-CM | POA: Diagnosis not present

## 2024-01-05 LAB — POCT GLUCOSE (DEVICE FOR HOME USE): POC Glucose: 115 mg/dL — AB (ref 70–99)

## 2024-01-05 LAB — POCT GLYCOSYLATED HEMOGLOBIN (HGB A1C): Hemoglobin A1C: 6.4 % — AB (ref 4.0–5.6)

## 2024-01-05 MED ORDER — TRULICITY 4.5 MG/0.5ML ~~LOC~~ SOAJ
4.5000 mg | SUBCUTANEOUS | 3 refills | Status: AC
  Filled 2024-01-05 – 2024-01-24 (×2): qty 6, 84d supply, fill #0
  Filled 2024-04-17: qty 6, 84d supply, fill #1

## 2024-01-05 MED ORDER — METFORMIN HCL ER 500 MG PO TB24
2000.0000 mg | ORAL_TABLET | Freq: Every day | ORAL | 3 refills | Status: AC
  Filled 2024-01-05 – 2024-03-05 (×2): qty 360, 90d supply, fill #0
  Filled 2024-05-27: qty 360, 90d supply, fill #1

## 2024-01-05 MED ORDER — REPAGLINIDE 1 MG PO TABS
1.0000 mg | ORAL_TABLET | Freq: Every day | ORAL | 3 refills | Status: AC
  Filled 2024-01-05: qty 90, 90d supply, fill #0
  Filled 2024-03-05 – 2024-03-30 (×2): qty 90, 90d supply, fill #1

## 2024-01-05 NOTE — Patient Instructions (Signed)
-   Continue  Repaglinide  1 mg , 1 tablet before  Supper  - Continue Metformin  500 mg XR , 4 tablets daily  - Continue Jardiance  25 mg daily - Continue Trulicity  4.5 mg weekly    HOW TO TREAT LOW BLOOD SUGARS (Blood sugar LESS THAN 70 MG/DL) Please follow the RULE OF 15 for the treatment of hypoglycemia treatment (when your (blood sugars are less than 70 mg/dL)   STEP 1: Take 15 grams of carbohydrates when your blood sugar is low, which includes:  3-4 GLUCOSE TABS  OR 3-4 OZ OF JUICE OR REGULAR SODA OR ONE TUBE OF GLUCOSE GEL    STEP 2: RECHECK blood sugar in 15 MINUTES STEP 3: If your blood sugar is still low at the 15 minute recheck --> then, go back to STEP 1 and treat AGAIN with another 15 grams of carbohydrates.

## 2024-01-08 ENCOUNTER — Other Ambulatory Visit (HOSPITAL_COMMUNITY): Payer: Self-pay

## 2024-01-09 ENCOUNTER — Ambulatory Visit: Admitting: Allergy & Immunology

## 2024-01-10 ENCOUNTER — Ambulatory Visit: Admitting: Cardiology

## 2024-01-11 ENCOUNTER — Other Ambulatory Visit (HOSPITAL_COMMUNITY): Payer: Self-pay

## 2024-01-24 ENCOUNTER — Other Ambulatory Visit (HOSPITAL_COMMUNITY): Payer: Self-pay

## 2024-01-25 ENCOUNTER — Other Ambulatory Visit (HOSPITAL_COMMUNITY): Payer: Self-pay

## 2024-01-25 ENCOUNTER — Other Ambulatory Visit: Payer: Self-pay | Admitting: Behavioral Health

## 2024-01-25 DIAGNOSIS — F411 Generalized anxiety disorder: Secondary | ICD-10-CM

## 2024-01-26 ENCOUNTER — Other Ambulatory Visit (HOSPITAL_COMMUNITY): Payer: Self-pay

## 2024-01-30 ENCOUNTER — Other Ambulatory Visit (HOSPITAL_COMMUNITY): Payer: Self-pay

## 2024-01-30 MED ORDER — CLONAZEPAM 0.5 MG PO TABS: 0.5000 mg | ORAL_TABLET | Freq: Two times a day (BID) | ORAL | 0 refills | Status: DC

## 2024-02-01 ENCOUNTER — Other Ambulatory Visit (HOSPITAL_COMMUNITY): Payer: Self-pay | Admitting: Pharmacist

## 2024-02-01 MED ORDER — STUDY - LIBREXIA-AF - APIXABAN 5 MG OR PLACEBO CAPSULE (PI-SETHI): 5.0000 mg | ORAL_CAPSULE | Freq: Two times a day (BID) | ORAL | 0 refills | Status: AC

## 2024-02-01 MED ORDER — STUDY - LIBREXIA-AF - JNJ-70033093 (MILVEXIAN) 100 MG OR PLACEBO (PI-SETHI): 1.0000 | ORAL_TABLET | Freq: Two times a day (BID) | ORAL | 0 refills | Status: AC

## 2024-02-12 ENCOUNTER — Other Ambulatory Visit (HOSPITAL_COMMUNITY): Payer: Self-pay

## 2024-02-12 ENCOUNTER — Encounter: Payer: Self-pay | Admitting: Behavioral Health

## 2024-02-12 ENCOUNTER — Telehealth: Payer: Self-pay | Admitting: Psychiatry

## 2024-02-12 ENCOUNTER — Telehealth (INDEPENDENT_AMBULATORY_CARE_PROVIDER_SITE_OTHER): Admitting: Behavioral Health

## 2024-02-12 ENCOUNTER — Other Ambulatory Visit: Payer: Self-pay

## 2024-02-12 DIAGNOSIS — F331 Major depressive disorder, recurrent, moderate: Secondary | ICD-10-CM

## 2024-02-12 DIAGNOSIS — F411 Generalized anxiety disorder: Secondary | ICD-10-CM | POA: Diagnosis not present

## 2024-02-12 MED ORDER — BUPROPION HCL ER (XL) 150 MG PO TB24
150.0000 mg | ORAL_TABLET | Freq: Every day | ORAL | 1 refills | Status: AC
  Filled 2024-02-12: qty 90, 90d supply, fill #0
  Filled 2024-05-25: qty 90, 90d supply, fill #1

## 2024-02-12 MED ORDER — CLONAZEPAM 0.5 MG PO TABS
0.5000 mg | ORAL_TABLET | Freq: Two times a day (BID) | ORAL | 2 refills | Status: AC
  Filled 2024-02-12: qty 60, 30d supply, fill #0
  Filled 2024-06-12: qty 60, 30d supply, fill #1

## 2024-02-12 MED ORDER — FLUOXETINE HCL 40 MG PO CAPS
80.0000 mg | ORAL_CAPSULE | Freq: Every day | ORAL | 0 refills | Status: DC
  Filled 2024-02-12 – 2024-04-01 (×3): qty 180, 90d supply, fill #0

## 2024-02-12 NOTE — Progress Notes (Signed)
 [name] [name] [id number] 1960-08-06 63 y.o.  Virtual Visit via Video Note  I connected with pt @ on 02/12/24 at 11:00 AM EDT by a video enabled telemedicine application and verified that I am speaking with the correct person using two identifiers.   I discussed the limitations of evaluation and management by telemedicine and the availability of in person appointments. The patient expressed understanding and agreed to proceed.  I discussed the assessment and treatment plan with the patient. The patient was provided an opportunity to ask questions and all were answered. The patient agreed with the plan and demonstrated an understanding of the instructions.   The patient was advised to call back or seek an in-person evaluation if the symptoms worsen or if the condition fails to improve as anticipated.  I provided 20 minutes of non-face-to-face time during this encounter.  The patient was located at home.  The provider was located at [facility].   [name] DELENA Pizza, [name] [name]   Subjective:   Patient ID:  [name] [name]. is a 62 y.o. (DOB 1961/01/23) male.  Chief Complaint:  Chief Complaint  Patient presents with   Depression   Anxiety   Follow-up   Patient Education   Medication Refill    HPI [name] [name], 63 year old male presents to this office via video visit for  follow up and medication management.  Reports doing much better and says no medication changes are necessary today. He has follow up with neurology in two weeks. Numerically rates his anxiety today at 3/10, and depression at 2/10.  He is sleeping 7 to 8 hours per night.  He denies any history of mania, no psychosis, no auditory or visual hallucinations.  Denies SI or HI.  Has strong family support from his wife and daughters.   Past psychiatric medication trials: Klonopin  Prozac  Duloxetine      Review of Systems:  Review of Systems  Medications: I have reviewed the patient's current medications.  Current  Outpatient Medications  Medication Sig Dispense Refill   acetaminophen  (TYLENOL ) 500 MG tablet Take 2 tablets (1,000 mg total) by mouth 2 (two) times daily as needed (for pain). 120 tablet 3   Alcohol  Swabs  (B-D SINGLE USE SWABS  REGULAR) PADS Use to check blood sugar daily 100 each 3   Blood Glucose Monitoring Suppl (BLOOD GLUCOSE MONITOR SYSTEM) w/Device KIT Use as directed once daily. 1 kit 0   buPROPion  (WELLBUTRIN  XL) 150 MG 24 hr tablet Take 1 tablet (150 mg total) by mouth daily. 90 tablet 1   cetirizine (ZYRTEC) 10 MG tablet Take 10 mg by mouth daily.     clonazePAM  (KLONOPIN ) 0.5 MG tablet Take 1 tablet (0.5 mg total) by mouth 2 (two) times daily. 60 tablet 2   COVID-19 mRNA vaccine, Pfizer, (COMIRNATY ) syringe Inject into the muscle. 0.3 mL 0   diltiazem  (CARDIZEM ) 30 MG tablet TAKE 1 TABLET BY MOUTH EVERY 4 HOURS AS NEEDED FOR HEART RATE>100. 90 tablet 0   doxycycline  (VIBRA -TABS) 100 MG tablet Take 1 tablet (100 mg total) by mouth 2 (two) times daily for 15 days. 30 tablet 0   Dulaglutide  (TRULICITY ) 4.5 MG/0.5ML SOAJ Inject 4.5 mg into the skin once a week. 6 mL 3   DULoxetine  (CYMBALTA ) 30 MG capsule Take 2 capsules (60 mg total) by mouth daily AND 1 capsule (30 mg total) at bedtime. 90 capsule 0   DULoxetine  (CYMBALTA ) 60 MG capsule Take 1 capsule (60 mg total) by mouth 2 (two) times daily. 180 capsule 0  empagliflozin  (JARDIANCE ) 25 MG TABS tablet Take 1 tablet (25 mg total) by mouth daily before breakfast. 90 tablet 3   EPINEPHrine  0.3 mg/0.3 mL IJ SOAJ injection Inject 0.3 mg into the muscle as needed for anaphylaxis. 2 each 0   FLUoxetine  (PROZAC ) 40 MG capsule Take 2 capsules (80 mg total) by mouth daily. 180 capsule 0   Glucose Blood (BLOOD GLUCOSE TEST STRIPS) STRP Use as directed once daily. 100 each 3   hydrochlorothiazide  (HYDRODIURIL ) 25 MG tablet Take 1 tablet (25 mg total) by mouth daily. 90 tablet 3   hydrOXYzine (ATARAX) 10 MG tablet Take 10 mg by mouth 2 (two) times  daily as needed.     Lancet Device MISC Use as directed in the morning, at noon, and at bedtime. 1 each 0   Lancets MISC Use as directed in the morning, at noon, and at bedtime. 300 each 3   lisinopril  (ZESTRIL ) 10 MG tablet Take 1 tablet (10 mg total) by mouth daily. 90 tablet 0   metFORMIN  (GLUCOPHAGE -XR) 500 MG 24 hr tablet Take 4 tablets (2,000 mg total) by mouth daily in the afternoon. 360 tablet 3   Multiple Vitamin (MULTIVITAMIN) tablet Take 1 tablet by mouth daily. 100 tablet 3   mupirocin  ointment (BACTROBAN ) 2 % Apply 1 Application topically 2 (two) times daily. 22 g 2   oxyCODONE -acetaminophen  (PERCOCET/ROXICET) 5-325 MG tablet Take 1 tablet by mouth every 6 (six) hours as needed for up to 12 doses for severe pain (pain score 7-10). 12 tablet 0   pantoprazole  (PROTONIX ) 40 MG tablet Take 1 tablet (40 mg total) by mouth daily. 90 tablet 3   pentoxifylline  (TRENTAL ) 400 MG CR tablet Take 1 tablet (400 mg total) by mouth 3 (three) times daily with meals. 270 tablet 3   pregabalin  (LYRICA ) 300 MG capsule TAKE 1 CAPSULE(300 MG) BY MOUTH TWICE DAILY 180 capsule 1   repaglinide  (PRANDIN ) 1 MG tablet Take 1 tablet (1 mg total) by mouth daily before supper. 90 tablet 3   rosuvastatin  (CRESTOR ) 20 MG tablet Take 1 tablet (20 mg total) by mouth at bedtime. 90 tablet 3   STUDY - LIBREXIA-AF - apixaban  5 mg or placebo capsule (PI-Sethi) Take 1 capsule (5 mg total) by mouth 2 (two) times daily. Take at approximately the same time of day with or without food. Please bring bottle back with you to every visit; do not discard bottle. Please contact [facility] for any questions or concerns regarding this medication. For Investigational Use Only. 420 capsule 0   STUDY - LIBREXIA-AF - [id number] (Milvexian) 100 mg or placebo tablet (PI-Sethi) Take 1 tablet by mouth 2 (two) times daily. Take at approximately the same time of day with or without food. Please bring bottle back with you to every  visit; do not discard bottle. Please contact [facility] for any questions or concerns regarding this medication. For Investigational Use Only. 420 tablet 0   triamcinolone  cream (KENALOG ) 0.1 % Apply 1 Application topically 2 (two) times daily. 454 g 1   TRUEplus Lancets 33G MISC Use to check blood sugar 2 times a day 200 each 3   No current facility-administered medications for this visit.    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   Pistachio Nut (Diagnostic) Hives, Shortness Of Breath, Itching, Swelling and Rash   Latex Dermatitis   Tape Other (See Comments)  Tears and bruises the skin   Betadine [Povidone Iodine] Rash and Other (See Comments)    Raised rash 04/23/14   Lidocaine  Other (See Comments)    Minimal effect with lidocaine , prefers different anesthetic    Past Medical History:  Diagnosis Date   Angio-edema    Anxiety    Claudication in peripheral vascular disease (HCC) 05/07/2022   Depression    Diabetes mellitus type II    Diabetic polyneuropathy associated with type 2 diabetes mellitus (HCC) 06/30/2018   GERD (gastroesophageal reflux disease)    History of kidney stones    HLD (hyperlipidemia)    HTN (hypertension)    Morbid obesity (HCC)    PAF (paroxysmal atrial fibrillation) (HCC) 08/31/2020   Paroxysmal atrial fibrillation (HCC)    Restless legs syndrome (RLS) 11/20/2012   Stroke (HCC) 02/14/2020   ischemic/right sided affected   Tobacco abuse     Family History  Problem Relation Age of Onset   Depression Mother    Anxiety disorder Mother    Stroke Mother        multiple   Leukemia Mother    Pneumonia Mother    Cancer Mother    Diabetes Mother    Diabetes Father    Heart disease Father    Hyperlipidemia Father    Hypertension Father    Allergic rhinitis Neg Hx    Angioedema Neg Hx    Asthma Neg Hx    Eczema Neg Hx     Immunodeficiency Neg Hx    Urticaria Neg Hx     Social History   Socioeconomic History   Marital status: Married    Spouse name: [name] [name]   Number of children: 3   Years of education: Not on file   Highest education level: 12th grade  Occupational History   Occupation: Therapist, nutritional and receivables    Comment: no longer working 07/07/20  Tobacco Use   Smoking status: Every Day    Types: E-cigarettes    Passive exposure: Never   Smokeless tobacco: Never   Tobacco comments:    Not smoking current /    Patient states he vapes - 10/25/2023  Vaping Use   Vaping status: Every Day   Substances: Nicotine, Flavoring  Substance and Sexual Activity   Alcohol  use: Not Currently    Alcohol /week: 0.0 standard drinks of alcohol     Comment: rarely   Drug use: No   Sexual activity: Yes    Partners: Female  Other Topics Concern   Not on file  Social History Narrative   Lives with wife   Right Handed   Drink 6-7 cups caffeine  daily   Social Drivers of Health   Financial Resource Strain: High Risk (08/26/2023)   Overall Financial Resource Strain (CARDIA)    Difficulty of Paying Living Expenses: Very hard  Food Insecurity: Food Insecurity Present (08/26/2023)   Hunger Vital Sign    Worried About Running Out of Food in the Last Year: Often true    Ran Out of Food in the Last Year: Often true  Transportation Needs: Unmet Transportation Needs (08/26/2023)   PRAPARE - Transportation    Lack of Transportation (Medical): Yes    Lack of Transportation (Non-Medical): Yes  Physical Activity: Inactive (08/26/2023)   Exercise Vital Sign    Days of Exercise per Week: 0 days    Minutes of Exercise per Session: 20 min  Stress: Stress Concern Present (08/26/2023)   [employer] of Occupational Health - Occupational Stress Questionnaire  Feeling of Stress : Very much  Social Connections: Socially Integrated (08/26/2023)   Social Connection and Isolation Panel    Frequency of  Communication with Friends and Family: More than three times a week    Frequency of Social Gatherings with Friends and Family: Once a week    Attends Religious Services: 1 to 4 times per year    Active Member of [employer] or Organizations: No    Attends Engineer, structural: More than 4 times per year    Marital Status: Married  Catering manager Violence: Not on file    Past Medical History, Surgical history, Social history, and Family history were reviewed and updated as appropriate.   Please see review of systems for further details on the patient's review from today.   Objective:   Physical Exam:  There were no vitals taken for this visit.  Physical Exam  Lab Review:     Component Value Date/Time   NA 139 10/03/2023 1357   NA 141 12/09/2022 1142   K 4.3 10/03/2023 1357   CL 102 10/03/2023 1357   CO2 30 10/03/2023 1357   GLUCOSE 121 (H) 10/03/2023 1357   BUN 20 10/03/2023 1357   BUN 29 (H) 12/09/2022 1142   CREATININE 1.29 10/03/2023 1357   CALCIUM  10.2 10/03/2023 1357   PROT 6.7 10/03/2023 1357   PROT 5.9 (L) 07/19/2022 1006   ALBUMIN 4.3 07/21/2023 0842   ALBUMIN 4.2 07/19/2022 1006   AST 15 10/03/2023 1357   ALT 17 10/03/2023 1357   ALKPHOS 32 (L) 07/21/2023 0842   BILITOT 0.6 10/03/2023 1357   BILITOT 0.4 07/19/2022 1006   GFRNONAA >60 10/15/2020 1100   GFRAA 91 06/17/2020 1030       Component Value Date/Time   WBC 8.0 10/03/2023 1357   RBC 5.62 10/03/2023 1357   HGB 15.9 10/03/2023 1357   HGB 16.1 07/19/2022 1006   HCT 49.5 10/03/2023 1357   HCT 46.1 07/19/2022 1006   PLT 211 10/03/2023 1357   PLT 182 07/19/2022 1006   MCV 88.1 10/03/2023 1357   MCV 85 07/19/2022 1006   MCH 28.3 10/03/2023 1357   MCHC 32.1 10/03/2023 1357   RDW 13.2 10/03/2023 1357   RDW 14.1 07/19/2022 1006   LYMPHSABS 2.0 07/21/2023 0842   LYMPHSABS 1.5 07/19/2022 1006   MONOABS 0.8 07/21/2023 0842   EOSABS 0.2 07/21/2023 0842   EOSABS 0.2 07/19/2022 1006   BASOSABS 0.1  07/21/2023 0842   BASOSABS 0.0 07/19/2022 1006    No results found for: POCLITH, LITHIUM   No results found for: PHENYTOIN, PHENOBARB, VALPROATE, CBMZ   .res Assessment: Plan:     Greater than 50% of 20 min video visit with patient was spent on counseling and coordination of care. Discussed his significant improvement since last visit.  He currently happy with his medication. He is not requesting medication change or adjustment. He will continue to see [name] [name]  for therapy.    Pt will continue:   To continue Wellbutrin  150 mg XL daily.  Cymbalta  60 twice daily mg daily To continue Prozac  40 mg twice daily  Continue Klonopin  take 1/2 to 1 tablet twice daily for severe anxiety.  Will report worsening symptoms or side effects promptly Will follow-up in 6 months to reassess Provided emergency contact information Discussed potential benefits, risk, and side effects of benzodiazepines to include potential risk of tolerance and dependence, as well as possible drowsiness.  Advised patient not to drive if experiencing drowsiness  and to take lowest possible effective dose to minimize risk of dependence and tolerance.  Reviewed PDMP    [name] LABOR. Earlean Fidalgo, [name] [name]       Elza Porteous was seen today for depression, anxiety, follow-up, patient education and medication refill.  Diagnoses and all orders for this visit:  Generalized anxiety disorder -     clonazePAM  (KLONOPIN ) 0.5 MG tablet; Take 1 tablet (0.5 mg total) by mouth 2 (two) times daily. -     FLUoxetine  (PROZAC ) 40 MG capsule; Take 2 capsules (80 mg total) by mouth daily.  Moderate episode of recurrent major depressive disorder (HCC) -     buPROPion  (WELLBUTRIN  XL) 150 MG 24 hr tablet; Take 1 tablet (150 mg total) by mouth daily. -     FLUoxetine  (PROZAC ) 40 MG capsule; Take 2 capsules (80 mg total) by mouth daily.     Please see After Visit Summary for patient specific instructions.  Future Appointments  Date  Time Provider Department Center  02/22/2024  2:00 PM [facility]  02/29/2024  2:00 PM Watt Mirza, MD [facility] [address]  03/04/2024  3:15 PM Gaynel Delon CROME, DPM [facility]  03/15/2024 10:30 AM Whitfield Raisin, [name] [name] [facility] None  06/04/2024  3:15 PM Gaynel Delon CROME, DPM [facility]  07/29/2024  1:40 PM Shamleffer, Donell Cardinal, MD [facility] None    No orders of the defined types were placed in this encounter.     -------------------------------

## 2024-02-13 ENCOUNTER — Other Ambulatory Visit: Payer: Self-pay

## 2024-02-13 ENCOUNTER — Other Ambulatory Visit (HOSPITAL_COMMUNITY): Payer: Self-pay

## 2024-02-19 ENCOUNTER — Other Ambulatory Visit: Payer: Self-pay | Admitting: Infectious Diseases

## 2024-02-19 ENCOUNTER — Other Ambulatory Visit (HOSPITAL_COMMUNITY): Payer: Self-pay

## 2024-02-19 ENCOUNTER — Other Ambulatory Visit: Payer: Self-pay | Admitting: Family Medicine

## 2024-02-20 ENCOUNTER — Other Ambulatory Visit (HOSPITAL_COMMUNITY): Payer: Self-pay

## 2024-02-20 ENCOUNTER — Other Ambulatory Visit: Payer: Self-pay

## 2024-02-20 MED ORDER — PREGABALIN 300 MG PO CAPS
300.0000 mg | ORAL_CAPSULE | Freq: Two times a day (BID) | ORAL | 1 refills | Status: AC
  Filled 2024-02-20 – 2024-02-23 (×3): qty 180, 90d supply, fill #0
  Filled 2024-06-12: qty 180, 90d supply, fill #1

## 2024-02-20 NOTE — Telephone Encounter (Signed)
 Last office visit 08/30/23 for CPE.  Last refilled 12/26/23 for #180 with 1 refills.  Rx was set on print so it probably did make it to the pharmacy.  Next Appt: 02/29/24 for 6 month follow up.

## 2024-02-20 NOTE — Telephone Encounter (Signed)
 Per 7/17 note, patient has stopped antibiotics.   Bethsaida Siegenthaler, BSN, RN

## 2024-02-21 ENCOUNTER — Other Ambulatory Visit (HOSPITAL_COMMUNITY): Payer: Self-pay

## 2024-02-21 ENCOUNTER — Other Ambulatory Visit: Payer: Self-pay | Admitting: Podiatry

## 2024-02-21 ENCOUNTER — Other Ambulatory Visit: Payer: Self-pay

## 2024-02-21 DIAGNOSIS — M2041 Other hammer toe(s) (acquired), right foot: Secondary | ICD-10-CM

## 2024-02-22 ENCOUNTER — Other Ambulatory Visit (HOSPITAL_COMMUNITY): Payer: Self-pay

## 2024-02-22 ENCOUNTER — Other Ambulatory Visit

## 2024-02-22 NOTE — Telephone Encounter (Signed)
 Left message requesting a call back to discuss refill request further.

## 2024-02-23 ENCOUNTER — Other Ambulatory Visit: Payer: Self-pay

## 2024-02-23 ENCOUNTER — Other Ambulatory Visit (HOSPITAL_COMMUNITY): Payer: Self-pay

## 2024-02-23 NOTE — Telephone Encounter (Signed)
 Forwarded to [employer]. Requested a call back.

## 2024-02-26 ENCOUNTER — Other Ambulatory Visit (HOSPITAL_COMMUNITY): Payer: Self-pay

## 2024-02-26 NOTE — Telephone Encounter (Signed)
 Called patient twice with no return call from him. Denying request until further questions can be answered.

## 2024-02-27 ENCOUNTER — Ambulatory Visit

## 2024-02-27 ENCOUNTER — Other Ambulatory Visit (HOSPITAL_COMMUNITY): Payer: Self-pay

## 2024-02-27 DIAGNOSIS — M2041 Other hammer toe(s) (acquired), right foot: Secondary | ICD-10-CM

## 2024-02-27 DIAGNOSIS — E11610 Type 2 diabetes mellitus with diabetic neuropathic arthropathy: Secondary | ICD-10-CM

## 2024-02-27 DIAGNOSIS — E1142 Type 2 diabetes mellitus with diabetic polyneuropathy: Secondary | ICD-10-CM

## 2024-02-27 NOTE — Progress Notes (Signed)
 Patient presents to the office today for diabetic shoe and insole measuring.  Patient was measured with brannock device to determine size and width for 1 pair of extra depth shoes and foam casted for 3 pair of insoles.   Documentation of medical necessity will be sent to patient's treating diabetic doctor to verify and sign.   Patient's diabetic provider:  Donell Stefano DOROTHA Sam [facility] Donell Stefano DOROTHA Sam, MD  Last visit with DTD 01/05/24 Qualifies stating toe osteomyelitis right 2nd digit  Shoes and insoles will be ordered at that time and patient will be notified for an appointment for fitting when they arrive.   Shoe size (per patient): 12WD  Brannock measurement: 12 Patient shoe selection- Shoe choice:   [id number] Shoe size ordered: 12WD  Ppw taken by patient to Dr for signature once returned Send for Prior auth

## 2024-02-27 NOTE — Progress Notes (Deleted)
 [name] [name] T. [name] Hojnacki, MD, CAQ Sports Medicine [facility] [address] [place], [address]  Phone: [phone]  FAX: [phone]  [name] [name]. - 63 y.o. male  MRN [id number]  Date of Birth: 11/11/1960  Date: 02/29/2024  PCP: Watt Mirza, MD  Referral: Watt Mirza, MD  No chief complaint on file.  Subjective:   [name] Delap. is a 63 y.o. very pleasant male patient with There is no height or weight on file to calculate BMI. who presents with the following:  Discussed the use of AI scribe software for clinical note transcription with the patient, who gave verbal consent to proceed.  [name] [name] is a complicated medical patient who presents for 30-month follow-up.  He does have anxiety and depression, and he is currently seeing behavioral health for management.  Historically, he has not done well been somewhat difficult to manage.  He also has significant type 2 diabetes, he has been seeing Dr. Edison for for a number of years now as well. He is currently on Trulicity  4.5 mg, Jardiance  25 mg, metformin  2000 mg a day, as well as Prandin .  History of multiple ischemic strokes.  Paroxysmal atrial fibrillation.  Currently on Cardizem  30 mg a day.  For additional blood pressure management, he is also on HCTZ 25 mg a day.  Hyperlipidemia, currently on Crestor  20 mg as well.  Chronic, severe peripheral neuropathy diabetic neuropathy.  Currently on Lyrica  300 mg p.o. twice daily  Peripheral vascular disease, chronic, currently on Trental  400 mg 3 times a day.  He has had multiple complications from diabetic wounds in the past.  In July, he was seeing infectious disease for diabetic foot infection and osteomyelitis. History of Present Illness     Review of Systems is noted in the HPI, as appropriate  Objective:   There were no vitals taken for this visit.  GEN: No acute distress; alert,appropriate. PULM: Breathing  comfortably in no respiratory distress PSYCH: Normally interactive.   Laboratory and Imaging Data:  Assessment and Plan:   No diagnosis found. Assessment & Plan   Medication Management during today's office visit: No orders of the defined types were placed in this encounter.  There are no discontinued medications.  Orders placed today for conditions managed today: No orders of the defined types were placed in this encounter.   Disposition: No follow-ups on file.  Dragon Medical One speech-to-text software was used for transcription in this dictation.  Possible transcriptional errors can occur using Animal nutritionist.   Signed,  [name] [name]. [name] Deans, MD   Outpatient Encounter Medications as of 02/29/2024  Medication Sig   acetaminophen  (TYLENOL ) 500 MG tablet Take 2 tablets (1,000 mg total) by mouth 2 (two) times daily as needed (for pain).   Alcohol  Swabs  (B-D SINGLE USE SWABS  REGULAR) PADS Use to check blood sugar daily   Blood Glucose Monitoring Suppl (BLOOD GLUCOSE MONITOR SYSTEM) w/Device KIT Use as directed once daily.   buPROPion  (WELLBUTRIN  XL) 150 MG 24 hr tablet Take 1 tablet (150 mg total) by mouth daily.   cetirizine (ZYRTEC) 10 MG tablet Take 10 mg by mouth daily.   clonazePAM  (KLONOPIN ) 0.5 MG tablet Take 1 tablet (0.5 mg total) by mouth 2 (two) times daily.   COVID-19 mRNA vaccine, Pfizer, (COMIRNATY ) syringe Inject into the muscle.   diltiazem  (CARDIZEM ) 30 MG tablet TAKE 1 TABLET BY MOUTH EVERY 4 HOURS AS NEEDED FOR HEART RATE>100.   doxycycline  (VIBRA -TABS) 100 MG tablet  Take 1 tablet (100 mg total) by mouth 2 (two) times daily for 15 days.   Dulaglutide  (TRULICITY ) 4.5 MG/0.5ML SOAJ Inject 4.5 mg into the skin once a week.   DULoxetine  (CYMBALTA ) 30 MG capsule Take 2 capsules (60 mg total) by mouth daily AND 1 capsule (30 mg total) at bedtime.   DULoxetine  (CYMBALTA ) 60 MG capsule Take 1 capsule (60 mg total) by mouth 2 (two) times daily.   empagliflozin   (JARDIANCE ) 25 MG TABS tablet Take 1 tablet (25 mg total) by mouth daily before breakfast.   EPINEPHrine  0.3 mg/0.3 mL IJ SOAJ injection Inject 0.3 mg into the muscle as needed for anaphylaxis.   FLUoxetine  (PROZAC ) 40 MG capsule Take 2 capsules (80 mg total) by mouth daily.   Glucose Blood (BLOOD GLUCOSE TEST STRIPS) STRP Use as directed once daily.   hydrochlorothiazide  (HYDRODIURIL ) 25 MG tablet Take 1 tablet (25 mg total) by mouth daily.   hydrOXYzine (ATARAX) 10 MG tablet Take 10 mg by mouth 2 (two) times daily as needed.   Lancet Device MISC Use as directed in the morning, at noon, and at bedtime.   Lancets MISC Use as directed in the morning, at noon, and at bedtime.   lisinopril  (ZESTRIL ) 10 MG tablet Take 1 tablet (10 mg total) by mouth daily.   metFORMIN  (GLUCOPHAGE -XR) 500 MG 24 hr tablet Take 4 tablets (2,000 mg total) by mouth daily in the afternoon.   Multiple Vitamin (MULTIVITAMIN) tablet Take 1 tablet by mouth daily.   mupirocin  ointment (BACTROBAN ) 2 % Apply 1 Application topically 2 (two) times daily.   oxyCODONE -acetaminophen  (PERCOCET/ROXICET) 5-325 MG tablet Take 1 tablet by mouth every 6 (six) hours as needed for up to 12 doses for severe pain (pain score 7-10).   pantoprazole  (PROTONIX ) 40 MG tablet Take 1 tablet (40 mg total) by mouth daily.   pentoxifylline  (TRENTAL ) 400 MG CR tablet Take 1 tablet (400 mg total) by mouth 3 (three) times daily with meals.   pregabalin  (LYRICA ) 300 MG capsule TAKE 1 CAPSULE(300 MG) BY MOUTH TWICE DAILY   repaglinide  (PRANDIN ) 1 MG tablet Take 1 tablet (1 mg total) by mouth daily before supper.   rosuvastatin  (CRESTOR ) 20 MG tablet Take 1 tablet (20 mg total) by mouth at bedtime.   STUDY - LIBREXIA-AF - apixaban  5 mg or placebo capsule (PI-Sethi) Take 1 capsule (5 mg total) by mouth 2 (two) times daily. Take at approximately the same time of day with or without food. Please bring bottle back with you to every visit; do not discard bottle.  Please contact [facility] for any questions or concerns regarding this medication. For Investigational Use Only.   STUDY - LIBREXIA-AF - [id number] (Milvexian) 100 mg or placebo tablet (PI-Sethi) Take 1 tablet by mouth 2 (two) times daily. Take at approximately the same time of day with or without food. Please bring bottle back with you to every visit; do not discard bottle. Please contact [facility] for any questions or concerns regarding this medication. For Investigational Use Only.   triamcinolone  cream (KENALOG ) 0.1 % Apply 1 Application topically 2 (two) times daily.   TRUEplus Lancets 33G MISC Use to check blood sugar 2 times a day   No facility-administered encounter medications on file as of 02/29/2024.

## 2024-02-28 ENCOUNTER — Encounter: Payer: Self-pay | Admitting: Internal Medicine

## 2024-02-29 ENCOUNTER — Other Ambulatory Visit (HOSPITAL_COMMUNITY): Payer: Self-pay

## 2024-02-29 ENCOUNTER — Ambulatory Visit: Admitting: Family Medicine

## 2024-02-29 ENCOUNTER — Other Ambulatory Visit: Payer: Self-pay | Admitting: Podiatry

## 2024-02-29 DIAGNOSIS — F330 Major depressive disorder, recurrent, mild: Secondary | ICD-10-CM

## 2024-02-29 DIAGNOSIS — I639 Cerebral infarction, unspecified: Secondary | ICD-10-CM

## 2024-02-29 DIAGNOSIS — E1159 Type 2 diabetes mellitus with other circulatory complications: Secondary | ICD-10-CM

## 2024-02-29 DIAGNOSIS — M2041 Other hammer toe(s) (acquired), right foot: Secondary | ICD-10-CM

## 2024-02-29 DIAGNOSIS — E1169 Type 2 diabetes mellitus with other specified complication: Secondary | ICD-10-CM

## 2024-02-29 DIAGNOSIS — M869 Osteomyelitis, unspecified: Secondary | ICD-10-CM

## 2024-02-29 DIAGNOSIS — I739 Peripheral vascular disease, unspecified: Secondary | ICD-10-CM

## 2024-02-29 DIAGNOSIS — F411 Generalized anxiety disorder: Secondary | ICD-10-CM

## 2024-02-29 DIAGNOSIS — I480 Paroxysmal atrial fibrillation: Secondary | ICD-10-CM

## 2024-02-29 DIAGNOSIS — E1142 Type 2 diabetes mellitus with diabetic polyneuropathy: Secondary | ICD-10-CM

## 2024-02-29 DIAGNOSIS — F410 Panic disorder [episodic paroxysmal anxiety] without agoraphobia: Secondary | ICD-10-CM

## 2024-02-29 DIAGNOSIS — I10 Essential (primary) hypertension: Secondary | ICD-10-CM

## 2024-03-01 ENCOUNTER — Other Ambulatory Visit (HOSPITAL_COMMUNITY): Payer: Self-pay

## 2024-03-01 ENCOUNTER — Telehealth: Payer: Self-pay

## 2024-03-01 ENCOUNTER — Other Ambulatory Visit: Payer: Self-pay

## 2024-03-01 MED ORDER — MUPIROCIN 2 % EX OINT
1.0000 | TOPICAL_OINTMENT | Freq: Two times a day (BID) | CUTANEOUS | 2 refills | Status: DC
  Filled 2024-03-01: qty 22, 11d supply, fill #0
  Filled 2024-03-12: qty 22, 11d supply, fill #1

## 2024-03-01 NOTE — Telephone Encounter (Signed)
 Shoe ppwk in  Indexed in media

## 2024-03-04 ENCOUNTER — Ambulatory Visit: Admitting: Podiatry

## 2024-03-04 DIAGNOSIS — Z91199 Patient's noncompliance with other medical treatment and regimen due to unspecified reason: Secondary | ICD-10-CM

## 2024-03-04 NOTE — Progress Notes (Signed)
 1. No-show for appointment

## 2024-03-05 ENCOUNTER — Other Ambulatory Visit (HOSPITAL_COMMUNITY): Payer: Self-pay

## 2024-03-05 ENCOUNTER — Other Ambulatory Visit: Payer: Self-pay | Admitting: Behavioral Health

## 2024-03-05 ENCOUNTER — Other Ambulatory Visit: Payer: Self-pay

## 2024-03-05 ENCOUNTER — Other Ambulatory Visit: Payer: Self-pay | Admitting: Internal Medicine

## 2024-03-05 MED ORDER — LISINOPRIL 10 MG PO TABS
10.0000 mg | ORAL_TABLET | Freq: Every day | ORAL | 0 refills | Status: AC
  Filled 2024-03-05: qty 30, 30d supply, fill #0

## 2024-03-05 NOTE — Telephone Encounter (Signed)
Please contact pt for future appointment. Pt overdue for 3 month f/u. Pt needing refills.

## 2024-03-07 ENCOUNTER — Other Ambulatory Visit (HOSPITAL_COMMUNITY): Payer: Self-pay

## 2024-03-07 ENCOUNTER — Other Ambulatory Visit: Payer: Self-pay

## 2024-03-07 MED ORDER — DULOXETINE HCL 60 MG PO CPEP
60.0000 mg | ORAL_CAPSULE | Freq: Two times a day (BID) | ORAL | 1 refills | Status: AC
  Filled 2024-03-07: qty 180, 90d supply, fill #0
  Filled 2024-03-12 – 2024-05-29 (×4): qty 180, 90d supply, fill #1

## 2024-03-12 ENCOUNTER — Ambulatory Visit (INDEPENDENT_AMBULATORY_CARE_PROVIDER_SITE_OTHER): Admitting: Podiatry

## 2024-03-12 ENCOUNTER — Other Ambulatory Visit: Payer: Self-pay

## 2024-03-12 ENCOUNTER — Other Ambulatory Visit (HOSPITAL_COMMUNITY): Payer: Self-pay

## 2024-03-12 DIAGNOSIS — Z91198 Patient's noncompliance with other medical treatment and regimen for other reason: Secondary | ICD-10-CM

## 2024-03-12 NOTE — Progress Notes (Signed)
 1. Failure to attend appointment with reason given    Appointment rescheduled by patient.

## 2024-03-12 NOTE — Progress Notes (Signed)
 Jaimarie Rapozo T. Rosetta Rupnow, MD, CAQ Sports [facility] [address] [place], [address]  Phone: [phone]  FAX: [phone]  [name] [name]. - 63 y.o. male  MRN [id number]  Date of Birth: 05-Aug-1960  Date: 03/14/2024  PCP: Watt Mirza, MD  Referral: Watt Mirza, MD  Chief Complaint  Patient presents with   Medical Management of Chronic Issues    6 month follow up   Subjective:   [name] [name]. is a 63 y.o. very pleasant male patient with Body mass index is 42.14 kg/m. who presents with the following:  Discussed the use of AI scribe software for clinical note transcription with the patient, who gave verbal consent to proceed.  [name] [name] is a complicated medical patient who presents for 80-month follow-up.  He does have anxiety and depression, and he is currently seeing behavioral health for management.  Historically, he has not done well been somewhat difficult to manage.  He also has significant type 2 diabetes, he has been seeing Dr. Sam for for a number of years now as well. He is currently on Trulicity  4.5 mg, Jardiance  25 mg, metformin  2000 mg a day, as well as Prandin . Lab Results  Component Value Date   HGBA1C 6.4 (A) 01/05/2024     History of multiple ischemic strokes.  Paroxysmal atrial fibrillation.  Currently on Cardizem  30 mg a day.  For additional blood pressure management, he is also on HCTZ 25 mg a day.  Hyperlipidemia, currently on Crestor  20 mg as well.  Chronic, severe peripheral neuropathy diabetic neuropathy.  Currently on Lyrica  300 mg p.o. twice daily  Peripheral vascular disease, chronic, currently on Trental  400 mg 3 times a day.  He has had multiple complications from diabetic wounds in the past.  In July, he was seeing infectious disease for diabetic foot infection and osteomyelitis.   History of Present Illness [name] [name]. [name] [name] is a 63 year old male with a history  of foot wound infection and osteomyelitis who presents for and medication management.  He has a history of a wound infection and osteomyelitis in his foot, treated a few months ago. Recently, he developed a new issue with a different toe where the toenail cut into the skin due to toe misalignment. He underwent debridement for the affected toe.  Foot pain worsens in the afternoon and is particularly severe at night. He takes his evening medications at 6 PM, including extra Tylenol , to manage the pain. He has noticed improved sensation in his feet since using a recliner to elevate them.  He experiences tremors, which worsen with anxiety. He has been prescribed clonazepam  as needed, but he does not take it regularly.  While this was primarily prescribed for anxiety and panic, he has used this for tremor as well in the past  He is on multiple medications for various conditions, including diabetes, neuropathy, and anxiety. He is concerned about the number of medications he takes and has consulted with a pharmacist to review them. He is currently taking Zyrtec daily for allergies.  He has a history of anxiety and depression, which have been challenging to manage. He is on high doses of Cymbalta , fluoxetine , and Wellbutrin , which have helped, but he still experiences anxiety, especially after the recent loss of his brother in a car accident. He is seeing a psychiatrist for ongoing management.  He has a history of significant weight loss, having lost 150 pounds, and no longer smokes. He  has a history of multiple strokes, diabetes, and atrial fibrillation, which require ongoing medication management.  Review of Systems is noted in the HPI, as appropriate  Objective:   BP 110/64   Pulse 94   Temp 97.9 F (36.6 C) (Temporal)   Ht 5' 9.69 (1.77 m)   Wt 291 lb 2 oz (132.1 kg)   SpO2 95%   BMI 42.14 kg/m   GEN: No acute distress; alert,appropriate. CV: RRR, no m/g/r  PULM: Normal respiratory rate,  no accessory muscle use. No wheezes, crackles or rhonchi  Trace lower extremity edema PSYCH: Normally interactive.  Left third toe with small ulcer  Laboratory and Imaging Data:  Assessment and Plan:     ICD-10-CM   1. Ischemic stroke (HCC), occipital  I63.9     2. Morbid obesity (HCC)  E66.01     3. Type 2 diabetes mellitus with vascular disease (HCC)  E11.59     4. PAF (paroxysmal atrial fibrillation) (HCC)  I48.0     5. Moderate episode of recurrent major depressive disorder (HCC)  F33.1     6. Hyperlipidemia associated with type 2 diabetes mellitus (HCC)  E11.69    E78.5     7. Generalized anxiety disorder  F41.1     8. Essential hypertension  I10     9. Diabetic polyneuropathy associated with type 2 diabetes mellitus (HCC)  E11.42     10. Claudication in peripheral vascular disease  I73.9     11. Circulation disorder of lower extremity  I99.9     12. Restless legs syndrome (RLS)  G25.81     13. Need for influenza vaccination  Z23 Flu vaccine trivalent PF, 6mos and older(Flulaval,Afluria,Fluarix,Fluzone)     Assessment & Plan Chronic ulcer of left third toe Ulcer improved post-debridement, no osteomyelitis on X-ray. - Proceed with scheduled tendinotomy on the second toe per podiatry.  I would not favor elective extensive surgery in this patient. - Administer prophylactic antibiotics post-surgery.  Type 2 diabetes mellitus with polyneuropathy and circulatory complications Blood sugar and A1c well-controlled. Neuropathy causes intermittent pain, managed with medications and acetaminophen . Propping feet up improved sensation. - Continue current diabetes management regimen. - Continue propping feet up. - Continue high-dose Lyrica   Paroxysmal atrial fibrillation  Peripheral vascular disease with claudication Claudication and blood flow impairment managed with current medications.  Continue Trental .  Morbid obesity Significant weight loss of 150 pounds achieved  through lifestyle changes. - Continue current lifestyle modifications.  Restless legs syndrome Current treatment with ropinirole  under consideration for discontinuation. - Discuss potential discontinuation of ropinirole  with substitution for different medication  Essential hypertension Blood pressure managed with current medications. Potential adjustment if propranolol added for tremor management. - Consider stopping hydrochlorothiazide  if propranolol is added.  Chronic pain of lower extremities Chronic pain persists, likely due to arthritis and previous obesity. Cymbalta  is part of current pain management. - Continue Cymbalta  for pain management.  Depression and anxiety Managed with Cymbalta , fluoxetine , and Wellbutrin . Anxiety exacerbated by recent family loss. Klonopin  causes sedation. Hydroxyzine not used. - Consider reducing Klonopin  dose to 0.25 mg to minimize sedation. - Discontinue hydroxyzine. - Continue to see psychiatry  Essential tremor Tremor likely essential, worsens with anxiety. Propranolol considered for as-needed use. - Prescribe propranolol for as-needed use, especially in social situations. - He has a neurology appointment tomorrow.  They are going to discuss the tremor with Dr. Rosemarie to see if he has any additional concerns.  Medication Management during today's office visit: Meds  ordered this encounter  Medications   propranolol (INDERAL) 20 MG tablet    Sig: Take 1 tablet (20 mg total) by mouth 2 (two) times daily as needed (tremor).    Dispense:  60 tablet    Refill:  2   Medications Discontinued During This Encounter  Medication Reason   hydrOXYzine (ATARAX) 10 MG tablet     Orders placed today for conditions managed today: Orders Placed This Encounter  Procedures   Flu vaccine trivalent PF, 6mos and older(Flulaval,Afluria,Fluarix,Fluzone)    Disposition: Return in about 6 months (around 09/12/2024) for Complete Physical.  [employer]  speech-to-text software was used for transcription in this dictation.  Possible transcriptional errors can occur using Animal nutritionist.   Signed,  Jacques DASEN. Veryl Abril, MD   Outpatient Encounter Medications as of 03/14/2024  Medication Sig   acetaminophen  (TYLENOL ) 500 MG tablet Take 2 tablets (1,000 mg total) by mouth 2 (two) times daily as needed (for pain).   Alcohol  Swabs  (B-D SINGLE USE SWABS  REGULAR) PADS Use to check blood sugar daily   Blood Glucose Monitoring Suppl (BLOOD GLUCOSE MONITOR SYSTEM) w/Device KIT Use as directed once daily.   buPROPion  (WELLBUTRIN  XL) 150 MG 24 hr tablet Take 1 tablet (150 mg total) by mouth daily.   cetirizine (ZYRTEC) 10 MG tablet Take 10 mg by mouth daily.   clonazePAM  (KLONOPIN ) 0.5 MG tablet Take 1 tablet (0.5 mg total) by mouth 2 (two) times daily.   diltiazem  (CARDIZEM ) 30 MG tablet TAKE 1 TABLET BY MOUTH EVERY 4 HOURS AS NEEDED FOR HEART RATE>100.   Dulaglutide  (TRULICITY ) 4.5 MG/0.5ML SOAJ Inject 4.5 mg into the skin once a week.   DULoxetine  (CYMBALTA ) 30 MG capsule Take 2 capsules (60 mg total) by mouth daily AND 1 capsule (30 mg total) at bedtime.   DULoxetine  (CYMBALTA ) 60 MG capsule Take 1 capsule (60 mg total) by mouth 2 (two) times daily.   empagliflozin  (JARDIANCE ) 25 MG TABS tablet Take 1 tablet (25 mg total) by mouth daily before breakfast.   EPINEPHrine  0.3 mg/0.3 mL IJ SOAJ injection Inject 0.3 mg into the muscle as needed for anaphylaxis.   FLUoxetine  (PROZAC ) 40 MG capsule Take 2 capsules (80 mg total) by mouth daily.   Glucose Blood (BLOOD GLUCOSE TEST STRIPS) STRP Use as directed once daily.   hydrochlorothiazide  (HYDRODIURIL ) 25 MG tablet Take 1 tablet (25 mg total) by mouth daily.   Lancet Device MISC Use as directed in the morning, at noon, and at bedtime.   Lancets MISC Use as directed in the morning, at noon, and at bedtime.   lisinopril  (ZESTRIL ) 10 MG tablet Take 1 tablet (10 mg total) by mouth daily.Please contact office  for future appointment and refills, Thank you [phone]   metFORMIN  (GLUCOPHAGE -XR) 500 MG 24 hr tablet Take 4 tablets (2,000 mg total) by mouth daily in the afternoon.   Multiple Vitamin (MULTIVITAMIN) tablet Take 1 tablet by mouth daily.   mupirocin  ointment (BACTROBAN ) 2 % Apply 1 Application topically 2 (two) times daily.   oxyCODONE -acetaminophen  (PERCOCET/ROXICET) 5-325 MG tablet Take 1 tablet by mouth every 6 (six) hours as needed for up to 12 doses for severe pain (pain score 7-10).   pantoprazole  (PROTONIX ) 40 MG tablet Take 1 tablet (40 mg total) by mouth daily.   pentoxifylline  (TRENTAL ) 400 MG CR tablet Take 1 tablet (400 mg total) by mouth 3 (three) times daily with meals.   pregabalin  (LYRICA ) 300 MG capsule TAKE 1 CAPSULE(300 MG) BY MOUTH TWICE  DAILY   propranolol (INDERAL) 20 MG tablet Take 1 tablet (20 mg total) by mouth 2 (two) times daily as needed (tremor).   repaglinide  (PRANDIN ) 1 MG tablet Take 1 tablet (1 mg total) by mouth daily before supper.   rosuvastatin  (CRESTOR ) 20 MG tablet Take 1 tablet (20 mg total) by mouth at bedtime.   STUDY - LIBREXIA-AF - apixaban  5 mg or placebo capsule (PI-Sethi) Take 1 capsule (5 mg total) by mouth 2 (two) times daily. Take at approximately the same time of day with or without food. Please bring bottle back with you to every visit; do not discard bottle. Please contact [facility] for any questions or concerns regarding this medication. For Investigational Use Only.   STUDY - LIBREXIA-AF - [id number] (Milvexian) 100 mg or placebo tablet (PI-Sethi) Take 1 tablet by mouth 2 (two) times daily. Take at approximately the same time of day with or without food. Please bring bottle back with you to every visit; do not discard bottle. Please contact [facility] for any questions or concerns regarding this medication. For Investigational Use Only.   triamcinolone  cream (KENALOG ) 0.1 % Apply 1 Application topically 2  (two) times daily.   TRUEplus Lancets 33G MISC Use to check blood sugar 2 times a day   [DISCONTINUED] hydrOXYzine (ATARAX) 10 MG tablet Take 10 mg by mouth 2 (two) times daily as needed.   [DISCONTINUED] mupirocin  ointment (BACTROBAN ) 2 % Apply 1 Application topically 2 (two) times daily.   No facility-administered encounter medications on file as of 03/14/2024.

## 2024-03-13 ENCOUNTER — Encounter (HOSPITAL_COMMUNITY): Payer: Self-pay

## 2024-03-13 ENCOUNTER — Other Ambulatory Visit (HOSPITAL_COMMUNITY): Payer: Self-pay

## 2024-03-14 ENCOUNTER — Ambulatory Visit (INDEPENDENT_AMBULATORY_CARE_PROVIDER_SITE_OTHER)

## 2024-03-14 ENCOUNTER — Ambulatory Visit: Admitting: Podiatry

## 2024-03-14 ENCOUNTER — Encounter: Payer: Self-pay | Admitting: Family Medicine

## 2024-03-14 ENCOUNTER — Other Ambulatory Visit (HOSPITAL_COMMUNITY): Payer: Self-pay

## 2024-03-14 ENCOUNTER — Ambulatory Visit (INDEPENDENT_AMBULATORY_CARE_PROVIDER_SITE_OTHER): Admitting: Family Medicine

## 2024-03-14 ENCOUNTER — Other Ambulatory Visit: Payer: Self-pay

## 2024-03-14 VITALS — BP 110/64 | HR 94 | Temp 97.9°F | Ht 69.69 in | Wt 291.1 lb

## 2024-03-14 DIAGNOSIS — E1159 Type 2 diabetes mellitus with other circulatory complications: Secondary | ICD-10-CM

## 2024-03-14 DIAGNOSIS — E1151 Type 2 diabetes mellitus with diabetic peripheral angiopathy without gangrene: Secondary | ICD-10-CM | POA: Diagnosis not present

## 2024-03-14 DIAGNOSIS — I739 Peripheral vascular disease, unspecified: Secondary | ICD-10-CM

## 2024-03-14 DIAGNOSIS — I10 Essential (primary) hypertension: Secondary | ICD-10-CM

## 2024-03-14 DIAGNOSIS — M2041 Other hammer toe(s) (acquired), right foot: Secondary | ICD-10-CM

## 2024-03-14 DIAGNOSIS — I639 Cerebral infarction, unspecified: Secondary | ICD-10-CM

## 2024-03-14 DIAGNOSIS — M2042 Other hammer toe(s) (acquired), left foot: Secondary | ICD-10-CM | POA: Diagnosis not present

## 2024-03-14 DIAGNOSIS — Z23 Encounter for immunization: Secondary | ICD-10-CM | POA: Diagnosis not present

## 2024-03-14 DIAGNOSIS — F331 Major depressive disorder, recurrent, moderate: Secondary | ICD-10-CM

## 2024-03-14 DIAGNOSIS — G2581 Restless legs syndrome: Secondary | ICD-10-CM | POA: Diagnosis not present

## 2024-03-14 DIAGNOSIS — E1169 Type 2 diabetes mellitus with other specified complication: Secondary | ICD-10-CM | POA: Diagnosis not present

## 2024-03-14 DIAGNOSIS — E785 Hyperlipidemia, unspecified: Secondary | ICD-10-CM | POA: Diagnosis not present

## 2024-03-14 DIAGNOSIS — E1142 Type 2 diabetes mellitus with diabetic polyneuropathy: Secondary | ICD-10-CM

## 2024-03-14 DIAGNOSIS — L97512 Non-pressure chronic ulcer of other part of right foot with fat layer exposed: Secondary | ICD-10-CM

## 2024-03-14 DIAGNOSIS — F411 Generalized anxiety disorder: Secondary | ICD-10-CM | POA: Diagnosis not present

## 2024-03-14 DIAGNOSIS — I480 Paroxysmal atrial fibrillation: Secondary | ICD-10-CM | POA: Diagnosis not present

## 2024-03-14 DIAGNOSIS — I999 Unspecified disorder of circulatory system: Secondary | ICD-10-CM

## 2024-03-14 MED ORDER — PROPRANOLOL HCL 20 MG PO TABS
20.0000 mg | ORAL_TABLET | Freq: Two times a day (BID) | ORAL | 2 refills | Status: AC | PRN
  Filled 2024-03-14: qty 60, 30d supply, fill #0
  Filled 2024-03-16: qty 60, 30d supply, fill #1

## 2024-03-14 MED ORDER — MUPIROCIN 2 % EX OINT
1.0000 | TOPICAL_OINTMENT | Freq: Two times a day (BID) | CUTANEOUS | 2 refills | Status: AC
  Filled 2024-03-14 – 2024-03-30 (×3): qty 22, 11d supply, fill #0
  Filled 2024-05-12: qty 22, 11d supply, fill #1
  Filled 2024-06-26: qty 22, 11d supply, fill #2

## 2024-03-14 NOTE — Progress Notes (Signed)
 Chief Complaint  Patient presents with   Wound Check    R 2nd toe wound opened 2 weeks ago.  Diabetic A1c 6.4.  on study Eliquis .    HPI: 63 y.o. male presenting today with concern for distal right second toe wound.  He is accompanied by wife today.  They are concerned that it opened about 2 weeks ago.  They state that they did have ciprofloxacin  which they did start taking for this and that the appearance of the wound has improved since.  They note that the toe was significantly swollen and discolored upon onset, this seems to have calm down.  Past Medical History:  Diagnosis Date   Angio-edema    Anxiety    Claudication in peripheral vascular disease 05/07/2022   Depression    Diabetes mellitus type II    Diabetic polyneuropathy associated with type 2 diabetes mellitus (HCC) 06/30/2018   GERD (gastroesophageal reflux disease)    History of kidney stones    HLD (hyperlipidemia)    HTN (hypertension)    Morbid obesity (HCC)    PAF (paroxysmal atrial fibrillation) (HCC) 08/31/2020   Paroxysmal atrial fibrillation (HCC)    Restless legs syndrome (RLS) 11/20/2012   Stroke (HCC) 02/14/2020   ischemic/right sided affected   Tobacco abuse    Past Surgical History:  Procedure Laterality Date   Arm surgery Right 1969   Abscess excision and debridement at elbow   COLONOSCOPY WITH PROPOFOL  N/A 09/06/2016   Procedure: COLONOSCOPY WITH PROPOFOL ;  Surgeon: Victory LITTIE Legrand DOUGLAS, MD;  Location: [facility];  Service: Gastroenterology;  Laterality: N/A;   I & D EXTREMITY Left 05/29/2014   Procedure: IRRIGATION AND DEBRIDEMENT OF LEFT LEG WOUND AND PLACEMENT OF  INTEGRA  AND  VAC;  Surgeon: Estefana Reichert, DO;  Location: [facility];  Service: Plastics;  Laterality: Left;   implantable loop recorder implant  03/06/2020   Medtronic Reveal [place] model LNQ11 (SN  [id number]) implanted by Dr Kelsie for cryptogenic stroke   INCISION AND DRAINAGE OF WOUND Left 04/23/2014   Procedure:  IRRIGATION AND DEBRIDEMENT LOWER LEFT LEG WOUND WITH PLACEMENT OF INTEGRA AND VAC;  Surgeon: Estefana Reichert, DO;  Location: [facility];  Service: Plastics;  Laterality: Left;   kidney stone removal  2004   MINOR APPLICATION OF WOUND VAC Left 04/23/2014   Procedure: MINOR APPLICATION OF WOUND VAC;  Surgeon: Estefana Reichert, DO;  Location: [facility];  Service: Plastics;  Laterality: Left;   SKIN SPLIT GRAFT Left 06/18/2014   Procedure: SKIN GRAFT SPLIT THICKNESS TO LOWER LEFT LEG WOUND WITH PLACEMENT OF VAC;  Surgeon: Estefana Reichert, DO;  Location: [facility];  Service: Plastics;  Laterality: Left;   VASECTOMY  1994   Allergies  Allergen Reactions   Cashew Nut Oil Anaphylaxis    Cashews, 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   Pistachio Nut (Diagnostic) Hives, Shortness Of Breath, Itching, Swelling and Rash   Latex Dermatitis   Tape Other (See Comments)    Tears and bruises the skin   Betadine [Povidone Iodine] Rash and Other (See Comments)    Raised rash 04/23/14   Lidocaine  Other (See Comments)    Minimal effect with lidocaine , prefers different anesthetic      PHYSICAL EXAM: There were no vitals filed for this visit.  General: The patient is alert and oriented x3 in no acute distress.  Dermatology: Skin is warm, dry and supple bilateral lower extremities. Interspaces are clear of maceration and debris.  Wound 1:  Location: Distal right second toe        Depth: Subcutaneous tissue        Wound Border: Macerated hyperkeratotic tissue        Wound Base: Fibrogranular base        Drainage: Scant serous       Odor?:  No        Surrounding Tissue: mild edema to the toe        Infected?:  appears stable appearing        Necrosis?:  Loss of skin subcutaneous tissue        Pain?:  No        Tunneling: No       Dimensions (cm): Predebridement measurements limited by overlying callus, postdebridement measurements 0.3 x 0.3 x 0.2 cm    Vascular: Pedal  pulses are faintly palpable secondary to edema.  Cap refill intact to the unaffected digits.  Decreased pedal hair growth.  Neurological: Light touch sensation decreased to toes.  Protective sensation decreased.  Musculoskeletal Exam: Hammertoe contracture right second toe.     Latest Ref Rng & Units 01/05/2024   11:48 AM 07/21/2023    8:42 AM 07/07/2023    9:37 AM 03/21/2023    3:14 PM  Hemoglobin A1C  Hemoglobin-A1c 4.0 - 5.6 % 6.4  6.6  6.8  7.8      RADIOGRAPHIC EXAM:  Normal osseous mineralization.   ASSESSMENT / PLAN OF CARE: 1. Hammertoe, bilateral   2. Skin ulcer of third toe of right foot with fat layer exposed (HCC)   3. Diabetic polyneuropathy associated with type 2 diabetes mellitus (HCC)      Meds ordered this encounter  Medications   mupirocin  ointment (BACTROBAN ) 2 %    Sig: Apply 1 Application topically 2 (two) times daily.    Dispense:  22 g    Refill:  2   None  # Right second toe ulcer with fat layer exposed The ulceration was sharply debrided of hyperkeratotic and devitalized soft tissue with sterile # 15 blade to the level of subcutaneous tissue.  Hemostasis obtained approximately 1 cc of blood loss.SABRA  Antibiotic ointment and DSD applied.  Reviewed off-loading with patient.  Mupirocin  prescribed to patient's pharmacy.  Postdebridement measurement 0.3 x 0.3 x 0.2 cm with subcutaneous tissue exposed, considerable nonviable tissue was removed from the wound bed and overlying nonviable tissue.  Continue to offload with crest pads and gel toe caps.  Has some surgical shoe  Reviewed daily dressing changes with patient.  Discussed risks / concerns regarding ulcer with patient and possible sequelae if left untreated.  Stressed importance of infection prevention at home. Short-term goals are: prevent infection, off-load ulcer, heal ulcer Long-term goals are:  prevent recurrence, prevent amputation.   # Right second toe hammertoe - Benefit from flexor tenotomy  procedure - He is currently on a new blood thinner for a study - Will see if we can get this scheduled for office surgery in a timely manner in the event that suture is needed for adequate closure and hemostasis, otherwise will have patient follow-up in 1 to 2 weeks for the procedure - Risk, benefits alternative therapies were discussed with the patient at length.  He expressed good understanding and is electing to proceed. - Written consent obtained. -I certify that this diagnosis represents a distinct and separate diagnosis that requires evaluation and treatment separate from other procedures or diagnosis   Return in about 2 weeks (around  03/28/2024) for right 2nd flexor tenotomy.   Haden Cavenaugh L. Lamount MAUL, AACFAS [facility]     [address] [place], [place] [address]                Office [phone]  Fax [phone]

## 2024-03-14 NOTE — Patient Instructions (Signed)
Instructions for Wound Care  The most important step to healing a foot wound is to reduce the pressure on your foot - it is extremely important to stay off your foot as much as possible and wear the shoe/boot as instructed.  Cleanse your foot with saline wash or warm soapy water (dial antibacterial soap or similar).  Blot dry.  Apply prescribed medication to your wound and cover with gauze and a bandage.  May hold bandage in place with Coban (self sticky wrap), Ace bandage or tape.  You may find dressing supplies at your local [employer], [employer], drug store or medical supply store.  Monitor for any signs/symptoms of infection. If there is any increase in redness, red streaks, increase in drainage, warmth to your foot please give us a call. Also, if you start to run a fever or have "flu-like" symptoms that can also be a sign of infection. Call the office immediately if any occur or go directly to the emergency room.   If you have any questions, please feel free to give us a call at [phone] or if you are on "MyChart" you can always send me a message if needed.    

## 2024-03-14 NOTE — Progress Notes (Deleted)
 [facility] [address] [place]. [place] [address] [phone]       OFFICE FOLLOW UP VISIT NOTE  [name] [name]. Date of Birth:  1960/11/12 Medical Record Number:  [id number]   Referring MD: [name] [name] Reason for Referral: Stroke     HPI:   Update 03/15/2024 JM: Patient returns for follow-up visit after prior visit with Dr. Rosemarie 1.5 years ago.  Overall he has been stable from neurological standpoint.  No new stroke/TIA symptoms.         History provided from Dr. Bucky prior OV note for reference purposes only Initial visit 04/02/2020 [name] [name] is a 63 year old Caucasian male seen today for initial office consultation visit for stroke.  He is accompanied by his wife.  History is obtained from them and review of electronic medical records and I personally reviewed imaging films in PACS.63 y.o. male with medical history significant of hypertension, hyperlipidemia, type 2 diabetes mellitus, restless leg syndrome, diabetic neuropathy, morbid obesity, tobacco abuse, depression/anxiety, GERD, chronic venous stasis ulcer in bilateral lower extremities presented to emergency department with headache and right thigh vision loss started 2 AM on the morning of admission on 02/14/2020.  He initially went to his ophthalmologist and was referred to the emergency department.  Concern was for acute stroke.  Patient was hospitalized for further management.  CT perfusion scan suggested a left occipital lobe infarct.  MRI was attempted but patient refused due to significant claustrophobia.  Subsequent repeat CT scan confirmed left posterior division MCA infarct.  CT angiogram showed no significant large vessel stenosis or occlusion.  Lower extremity venous Dopplers were negative for DVT.  Transthoracic echo showed normal ejection fraction without cardiac source of embolism.  LDL cholesterol was quite low at 15 mg percent and hemoglobin A1c was elevated 8.4.  Patient was  started on aspirin  and Plavix  for 3 weeks followed by aspirin  alone.  Patient states is done well since discharge she is now beginning to see some movement in the right peripheral field of vision but is still not able to count fingers.  He still has trouble reading and comprehending.  His speech has improved though it is still slightly hesitant.  Is tolerating aspirin  well without bruising or bleeding.  Patient is unable to return to work in [employer] and is currently on short-term disability.  He has not been driving.  He denies any prior history of atrial fibrillation, syncope or palpitations.  He had outpatient loop recorder placed by Dr. Kelsie and so for paroxysmal A. fib has not yet been found.  He also complains of mild short-term memory and cognitive difficulties which are new since his stroke. Update 07/07/2020: He returns for follow-up after last visit 3 months ago.  Is accompanied by his wife.  Patient has noted improvement in his speech peripheral vision but thinks continues to have occasional word finding difficulties and has to speak in a slow.  He notices that if he tries to talk too fast or get excited he has more trouble.  He noticed slight improvement in his right-sided peripheral vision particularly in the quadrant though he still has significant vision loss in the lower quadrant and has not been driving.  He was recently seen by optometry wrist who asked him not to drive.  Patient had loop recorder inserted and so for paroxysmal A. fib has not yet been found.  He did participate in the [place] stroke trial and qualified and is on the trial medication.  Today  he signed consent to be in the cognitive soft study of the trial.  He is tolerating the trial medications well without bleeding or bruising.  His blood pressure is well controlled and today it is borderline at 140/85.  He states his sugars are doing quite good and last A1c checked in January this year was 5.8.  His lipids are satisfactory.  He  has been overall eating a healthy diet and is quite controlled on what he eats and is overall lost 130 pounds over the last 1 year.  He is finished outpatient speech therapy but is enrolled to participate in [place] summer program for speech and language.His Paxil  dose was recently increased by his primary care physician to help with his underlying anxiety and stress.  He does have diabetic peripheral neuropathy but Lyrica  seems to help his paresthesias quite well and is able to sleep well at night.  He works as an Product/process development scientist in [employer] but clearly due to his cognitive and speech difficulties he will not be able to work and may have to go on long-term disability.   Update 12/23/2020: He returns for follow-up after last visit with me 5 months ago.  Is accompanied by his wife.  Patient's wife called me in March 2022 stating patient had been complaining of some new headaches for the last couple of weeks and on a couple of occasions  she noticed that he was found staring into the distance as if he was not there but however when she called out his name he responded right away.  Denied seeing any twitching some is lips or hands or any involuntary seizure-like movements .he had an outpatient MRI scan done on 07/30/2020 which showed small acute to subacute infarcts involving the right thalamocapsular junction, left frontal and bilateral parietal and left temporal regions with old chronic left PCA infarct and chronic right occipital and cerebellar and thalamic infarcts as well.  He had outpatient EEG on 09/01/2022 which was slightly abnormal showing mild slowing but no epileptiform activity.  Patient was in the [place] trial and given the strokes he was asked to discontinue the study medication and was started on Eliquis  after he was found to have paroxysmal A. fib on the loop recorder on 09/05/2020.  Patient is tolerating Eliquis  well with only minor bruising and no bleeding.  Continues to have short-term memory  difficulties and mild word finding difficulties as well.  He has trouble with multitasking as well.  He recently had neuropsych testing which she had a tough time completing however I do not have the results to review today.  He is doing some crossword puzzles but not doing many mentally challenging activities at present.  Is also not been physically very active.  He uses a cane to walk he has had no falls or injuries.  He has had no further recurrent stroke or TIA symptoms. Update 07/06/2021 : He returns for follow-up after last visit 6 months ago.  He is accompanied by his wife he is doing well.  He has had no recurrent TIA or stroke symptoms.  He remains on Eliquis  which is tolerating well without bruising or bleeding.  States diabetes is under good control and last hemoglobin A1c was 6.7 on 06/08/2021.  His blood pressure also is under good control and today it is 109/71.  He remains on Crestor  which is tolerating well without muscle aches and pains.  Patient has been recently started on Paxil  for anxiety depression which is not sure  is working yet.  Continues to have short-term memory difficulties which are present but not getting worse.  He is mostly independent in activities of daily living.  Team needs slight help with getting in and out of shower more for safety reasons we can bathe himself.  He also occasionally stumbles on certain words and when he tries to talk fast.  His mental processing is also slow.  Patient has known obstructive sleep apnea in the past had seen a pulmonologist and was prescribed CPAP but he lost a lot of weight and after that he was told him in no longer needed.  He also has some trouble reading he can see the words but sometimes they do not make sense and he is very slow with his reading.  Update 08/02/2022 ; he returns for follow-up after last visit a year ago.  He is accompanied by his wife.  States he is doing well.  He has had no recurrent stroke or TIA symptoms.  He remains on  Eliquis  which is tolerating well without bruising or bleeding.  His blood pressure is under good control today it is 127/72.  He is tolerating Crestor  well without muscle aches and pains.  Last lipid profile on 07/22/2022 showed LDL cholesterol to be 51 mg percent.  Hemoglobin A1c was 6.7 on 06/10/2022.  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  No acute abnormality.  Patient continues to have mild short-term memory difficulties but these appear to be unchanged and nonprogressive.  Currently mild help with activities of daily living but can manage most of his affairs himself.  He does participate in some cognitively challenging activities like solving crossword puzzles and doing sudoku.  He has no new complaints.  Continues to have right-sided peripheral vision loss he is careful and has not had any falls or injuries       ROS:   14 system review of systems is positive for vision difficulties memory loss, speech and word finding difficulties difficulty with reading and comprehension, anxiety all other systems negative PMH:  Past Medical History:  Diagnosis Date   Angio-edema    Anxiety    Claudication in peripheral vascular disease 05/07/2022   Depression    Diabetes mellitus type II    Diabetic polyneuropathy associated with type 2 diabetes mellitus (HCC) 06/30/2018   GERD (gastroesophageal reflux disease)    History of kidney stones    HLD (hyperlipidemia)    HTN (hypertension)    Morbid obesity (HCC)    PAF (paroxysmal atrial fibrillation) (HCC) 08/31/2020   Paroxysmal atrial fibrillation (HCC)    Restless legs syndrome (RLS) 11/20/2012   Stroke (HCC) 02/14/2020   ischemic/right sided affected   Tobacco abuse     Social History:  Social History   Socioeconomic History   Marital status: Married    Spouse name: Pensions consultant   Number of children: 3   Years of education: Not on file   Highest education level: 12th grade  Occupational History   Occupation: Therapist, nutritional and receivables    Comment: no longer working 07/07/20  Tobacco Use   Smoking status: Every Day    Types: E-cigarettes    Passive exposure: Never   Smokeless tobacco: Never   Tobacco comments:    Not smoking current /    Patient states he vapes -  10/25/2023  Vaping Use   Vaping status: Every Day   Substances: Nicotine, Flavoring  Substance and Sexual Activity   Alcohol  use: Not Currently    Alcohol /week: 0.0 standard drinks of alcohol     Comment: rarely   Drug use: No   Sexual activity: Yes    Partners: Female  Other Topics Concern   Not on file  Social History Narrative   Lives with wife   Right Handed   Drink 6-7 cups caffeine  daily   Social Drivers of Health   Financial Resource Strain: High Risk (03/10/2024)   Overall Financial Resource Strain (CARDIA)    Difficulty of Paying Living Expenses: Very hard  Food Insecurity: Food Insecurity Present (03/10/2024)   Hunger Vital Sign    Worried About Running Out of Food in the Last Year: Often true    Ran Out of Food in the Last Year: Often true  Transportation Needs: Unmet Transportation Needs (03/10/2024)   PRAPARE - Administrator, Civil Service (Medical): Yes    Lack of Transportation (Non-Medical): Yes  Physical Activity: Insufficiently Active (03/10/2024)   Exercise Vital Sign    Days of Exercise per Week: 2 days    Minutes of Exercise per Session: 20 min  Stress: Stress Concern Present (03/10/2024)   [employer] of Occupational Health - Occupational Stress Questionnaire    Feeling of Stress: Very much  Social Connections: Moderately Isolated (03/10/2024)   Social Connection and Isolation Panel    Frequency of Communication with Friends and Family: Twice a week    Frequency of Social Gatherings with Friends and Family: Once a week     Attends Religious Services: Never    Database administrator or Organizations: No    Attends Engineer, structural: Not on file    Marital Status: Married  Catering manager Violence: Not on file    Medications:   Current Outpatient Medications on File Prior to Visit  Medication Sig Dispense Refill   acetaminophen  (TYLENOL ) 500 MG tablet Take 2 tablets (1,000 mg total) by mouth 2 (two) times daily as needed (for pain). 120 tablet 3   Alcohol  Swabs  (B-D SINGLE USE SWABS  REGULAR) PADS Use to check blood sugar daily 100 each 3   Blood Glucose Monitoring Suppl (BLOOD GLUCOSE MONITOR SYSTEM) w/Device KIT Use as directed once daily. 1 kit 0   buPROPion  (WELLBUTRIN  XL) 150 MG 24 hr tablet Take 1 tablet (150 mg total) by mouth daily. 90 tablet 1   cetirizine (ZYRTEC) 10 MG tablet Take 10 mg by mouth daily.     clonazePAM  (KLONOPIN ) 0.5 MG tablet Take 1 tablet (0.5 mg total) by mouth 2 (two) times daily. 60 tablet 2   diltiazem  (CARDIZEM ) 30 MG tablet TAKE 1 TABLET BY MOUTH EVERY 4 HOURS AS NEEDED FOR HEART RATE>100. 90 tablet 0   Dulaglutide  (TRULICITY ) 4.5 MG/0.5ML SOAJ Inject 4.5 mg into the skin once a week. 6 mL 3   DULoxetine  (CYMBALTA ) 30 MG capsule Take 2 capsules (60 mg total) by mouth daily AND 1 capsule (30 mg total) at bedtime. 90 capsule 0   DULoxetine  (CYMBALTA ) 60 MG capsule Take 1 capsule (60 mg total) by mouth 2 (two) times daily. 180 capsule 1   empagliflozin  (JARDIANCE ) 25 MG TABS tablet Take 1 tablet (25 mg total) by mouth daily before breakfast. 90 tablet 3   EPINEPHrine  0.3 mg/0.3 mL IJ SOAJ injection Inject 0.3 mg into the muscle as needed for anaphylaxis. 2 each 0  FLUoxetine  (PROZAC ) 40 MG capsule Take 2 capsules (80 mg total) by mouth daily. 180 capsule 0   Glucose Blood (BLOOD GLUCOSE TEST STRIPS) STRP Use as directed once daily. 100 each 3   hydrochlorothiazide  (HYDRODIURIL ) 25 MG tablet Take 1 tablet (25 mg total) by mouth daily. 90 tablet 3   hydrOXYzine (ATARAX)  10 MG tablet Take 10 mg by mouth 2 (two) times daily as needed.     Lancet Device MISC Use as directed in the morning, at noon, and at bedtime. 1 each 0   Lancets MISC Use as directed in the morning, at noon, and at bedtime. 300 each 3   lisinopril  (ZESTRIL ) 10 MG tablet Take 1 tablet (10 mg total) by mouth daily.Please contact office for future appointment and refills, Thank you [phone] 30 tablet 0   metFORMIN  (GLUCOPHAGE -XR) 500 MG 24 hr tablet Take 4 tablets (2,000 mg total) by mouth daily in the afternoon. 360 tablet 3   Multiple Vitamin (MULTIVITAMIN) tablet Take 1 tablet by mouth daily. 100 tablet 3   mupirocin  ointment (BACTROBAN ) 2 % Apply 1 Application topically 2 (two) times daily. 22 g 2   oxyCODONE -acetaminophen  (PERCOCET/ROXICET) 5-325 MG tablet Take 1 tablet by mouth every 6 (six) hours as needed for up to 12 doses for severe pain (pain score 7-10). 12 tablet 0   pantoprazole  (PROTONIX ) 40 MG tablet Take 1 tablet (40 mg total) by mouth daily. 90 tablet 3   pentoxifylline  (TRENTAL ) 400 MG CR tablet Take 1 tablet (400 mg total) by mouth 3 (three) times daily with meals. 270 tablet 3   pregabalin  (LYRICA ) 300 MG capsule TAKE 1 CAPSULE(300 MG) BY MOUTH TWICE DAILY 180 capsule 1   repaglinide  (PRANDIN ) 1 MG tablet Take 1 tablet (1 mg total) by mouth daily before supper. 90 tablet 3   rosuvastatin  (CRESTOR ) 20 MG tablet Take 1 tablet (20 mg total) by mouth at bedtime. 90 tablet 3   STUDY - LIBREXIA-AF - apixaban  5 mg or placebo capsule (PI-Sethi) Take 1 capsule (5 mg total) by mouth 2 (two) times daily. Take at approximately the same time of day with or without food. Please bring bottle back with you to every visit; do not discard bottle. Please contact [facility] for any questions or concerns regarding this medication. For Investigational Use Only. 420 capsule 0   STUDY - LIBREXIA-AF - [id number] (Milvexian) 100 mg or placebo tablet (PI-Sethi) Take 1 tablet by mouth 2  (two) times daily. Take at approximately the same time of day with or without food. Please bring bottle back with you to every visit; do not discard bottle. Please contact [facility] for any questions or concerns regarding this medication. For Investigational Use Only. 420 tablet 0   triamcinolone  cream (KENALOG ) 0.1 % Apply 1 Application topically 2 (two) times daily. 454 g 1   TRUEplus Lancets 33G MISC Use to check blood sugar 2 times a day 200 each 3   No current facility-administered medications on file prior to visit.    Allergies:   Allergies  Allergen Reactions   Cashew Nut Oil Anaphylaxis    Cashews, 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   Pistachio Nut (Diagnostic) Hives, Shortness Of Breath, Itching, Swelling and Rash   Latex Dermatitis   Tape Other (See Comments)    Tears and bruises the skin   Betadine [Povidone Iodine] Rash and Other (See Comments)    Raised rash 04/23/14   Lidocaine  Other (See Comments)    Minimal effect with lidocaine ,  prefers different anesthetic    Physical Exam General: Obese middle-aged Caucasian male seated, in no evident distress Head: head normocephalic and atraumatic.   Neck: supple with no carotid or supraclavicular bruits Cardiovascular: regular rate and rhythm, no murmurs Musculoskeletal: no deformity Skin:  no rash/petichiae Vascular:  Normal pulses all extremities  Neurologic Exam Mental Status: Awake and fully alert. Oriented to place and time. Recent and remote memory intact. Attention span, concentration and fund of knowledge appropriate. Mood and affect appropriate.  Speech is slightly nonfluent, deliberate and hesitant but no dysarthria or paraphasic errors.  Diminished recall 1/3.  Able to name 10 animals which can walk on 4 legs.  Clock drawing 4/4. Cranial Nerves: Fundoscopic exam not done. Pupils equal, briskly reactive to light. Extraocular movements full without nystagmus. Visual fields show partial right homonymous hemianopsia  to confrontation with more deficit in the inferior quadrant.SABRA Hearing intact. Facial sensation intact.  Mild right lower facial asymmetry.  Tongue, palate moves normally and symmetrically.  Motor: Normal bulk and tone. Normal strength in all tested extremity muscles. Sensory.: intact to touch , pinprick , position and vibratory sensation.  Coordination: Rapid alternating movements normal in all extremities. Finger-to-nose and heel-to-shin performed accurately bilaterally. Gait and Station: Arises from chair without difficulty. Stance is normal. Gait demonstrates normal stride length and balance . Able to heel, toe and tandem walk with great difficulty.  Reflexes: 1+ and symmetric. Toes downgoing.   NIHSS  3 Modified Rankin  2   ASSESSMENT: 63 year old Caucasian male with embolic left MCA branch infarct in September 2021 and bicerebral small infarcts in March 2022 of cryptogenic etiology.  But subsequently paroxysmal A. fib found on loop recorder on 09/05/2020. Vascular risk factors of diabetes, hypertension hyperlipidemia. PAF and obesity.  He also has persistent right-sided peripheral vision loss, mild expressive language difficulties mild post stroke cognitive impairment.  He is doing well and stable from neurovascular standpoint.     PLAN: I had a long d/w patient about his remote cryptogenic strokes, mild residual aphasia, visual field defect and cognitive impairment, risk for recurrent stroke/TIAs, personally independently reviewed imaging studies and stroke evaluation results and answered questions.Continue Eliquis  (apixaban ) daily  for secondary stroke prevention for history of paroxysmal A-fib and maintain strict control of hypertension with blood pressure goal below 130/90, diabetes with hemoglobin A1c goal below 6.5% and lipids with LDL cholesterol goal below 70 mg/dL. I also advised the patient to eat a healthy diet with plenty of whole grains, cereals, fruits and vegetables, exercise  regularly and maintain ideal body weight.  Refer to sleep clinic for discussion about treatment options for obstructive sleep apnea.  I also recommend he increase participation in cognitively challenging activities like solving crossword puzzles, playing bridge and sudoku.  We also discussed memory compensation strategies.  Patient may also consider possible participation in the  [place] AF trial if interested and will be given information to review at home and decide followup in the future with me in 1 year or call earlier if necessary.Greater than 50% time during this prolonged 35-minute visit was spent on counseling and coordination of care about his cryptogenic stroke discussion about stroke prevention treatment and answering questions. Eather Popp, MD Note: This document was prepared with digital dictation and possible smart phrase technology. Any transcriptional errors that result from this process are unintentional.

## 2024-03-14 NOTE — Patient Instructions (Addendum)
 Try stopping the Zyrtec    Klonopin  - try 1/2 tablet for less side effects

## 2024-03-15 ENCOUNTER — Ambulatory Visit: Admitting: Adult Health

## 2024-03-16 ENCOUNTER — Other Ambulatory Visit: Payer: Self-pay | Admitting: Internal Medicine

## 2024-03-16 ENCOUNTER — Encounter: Payer: Self-pay | Admitting: Podiatry

## 2024-03-16 ENCOUNTER — Other Ambulatory Visit: Payer: Self-pay | Admitting: Family Medicine

## 2024-03-17 ENCOUNTER — Encounter: Payer: Self-pay | Admitting: Podiatry

## 2024-03-17 ENCOUNTER — Other Ambulatory Visit (HOSPITAL_COMMUNITY): Payer: Self-pay

## 2024-03-18 ENCOUNTER — Other Ambulatory Visit: Payer: Self-pay

## 2024-03-20 ENCOUNTER — Telehealth: Payer: Self-pay | Admitting: Podiatry

## 2024-03-20 NOTE — Telephone Encounter (Signed)
 DOS- 03/28/2024  TENOTOMY SINGLE 2ND- 71989  [employer] AD EFFECTIVE DATE- 05/31/2023  DEDUCTIBLE- N/A OOP- $3500 ACCUMULATED- $643.66 REF# [id number]  PER [place] WITH [employer], NO PRIOR AUTH IS REQUIRED FOR CPT CODE 28010. REF# [id number] 03/20/2024 10:30AM

## 2024-03-21 ENCOUNTER — Other Ambulatory Visit (HOSPITAL_COMMUNITY): Payer: Self-pay

## 2024-03-25 ENCOUNTER — Other Ambulatory Visit: Payer: Self-pay | Admitting: Family Medicine

## 2024-03-25 ENCOUNTER — Other Ambulatory Visit (HOSPITAL_COMMUNITY): Payer: Self-pay

## 2024-03-25 MED ORDER — TRIAMCINOLONE ACETONIDE 0.1 % EX CREA
1.0000 | TOPICAL_CREAM | Freq: Two times a day (BID) | CUTANEOUS | 1 refills | Status: AC
  Filled 2024-03-30: qty 454, 90d supply, fill #0
  Filled 2024-06-26: qty 454, 90d supply, fill #1

## 2024-03-25 NOTE — Telephone Encounter (Signed)
 Last office visit 03/14/2024 for 6 month follow up.  Last refilled 09/18/2023 for 454 g with 1 refill.  Next Appt: CPE 09/12/2024

## 2024-03-28 ENCOUNTER — Ambulatory Visit (INDEPENDENT_AMBULATORY_CARE_PROVIDER_SITE_OTHER): Admitting: Podiatry

## 2024-03-28 ENCOUNTER — Other Ambulatory Visit (HOSPITAL_COMMUNITY): Payer: Self-pay

## 2024-03-28 ENCOUNTER — Other Ambulatory Visit: Payer: Self-pay

## 2024-03-28 VITALS — BP 122/70 | HR 81

## 2024-03-28 DIAGNOSIS — M2042 Other hammer toe(s) (acquired), left foot: Secondary | ICD-10-CM | POA: Diagnosis not present

## 2024-03-28 DIAGNOSIS — L97512 Non-pressure chronic ulcer of other part of right foot with fat layer exposed: Secondary | ICD-10-CM

## 2024-03-28 DIAGNOSIS — M2041 Other hammer toe(s) (acquired), right foot: Secondary | ICD-10-CM | POA: Diagnosis not present

## 2024-03-28 MED ORDER — CEPHALEXIN 500 MG PO CAPS
500.0000 mg | ORAL_CAPSULE | Freq: Three times a day (TID) | ORAL | 0 refills | Status: AC
  Filled 2024-03-28: qty 9, 3d supply, fill #0

## 2024-03-28 MED ORDER — OXYCODONE HCL 5 MG PO TABS
5.0000 mg | ORAL_TABLET | ORAL | 0 refills | Status: AC | PRN
  Filled 2024-03-28: qty 20, 4d supply, fill #0

## 2024-03-28 MED ORDER — ACETAMINOPHEN 500 MG PO TABS
1000.0000 mg | ORAL_TABLET | Freq: Two times a day (BID) | ORAL | 3 refills | Status: AC | PRN
  Filled 2024-03-28 – 2024-04-10 (×2): qty 120, 30d supply, fill #0
  Filled 2024-04-28 – 2024-05-12 (×2): qty 120, 30d supply, fill #1
  Filled 2024-06-26 (×2): qty 120, 30d supply, fill #2

## 2024-03-28 NOTE — Progress Notes (Unsigned)
 Surgeon: Dr. 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  He has associated ulceration to the distal tip of the right second toe.  He does have diabetes.  Due to the flexure contracture of the toe, recommended flexor tenotomy to offload the ulcer and aid in the healing of the second toe. All alternatives, risks, complications were discussed with the patient detail. No promises or guarantees were given as to the outcome of the procedure and all questions were answered to best of my ability.  Written consent was obtained.  Procedure in detail: The patient was both verbally and visually identified by myself, the nurse who assisted in the procedure.  The right second toe was anesthetized with local anesthetic with a 2:1 ratio of 3 cc of 0.5% Sensorcaine  plain mixed with 2% lidocaine  plain following alcohol  skin prep.  The right second toe and right forefoot was then prepped with ChloraPrep.  Sterile drape was applied to the right foot.  A timeout was called confirming the patient's identity, surgical site, planned procedure.  Rubber band tourniquet was applied to the right second toe at the level of the sulcus and secured in place with a sterile hemostat.  A #62 Beaver blade was used to make an incision at the plantar PIPJ level of the right second toe about 0.5 cm  in length, transverse orientation.  The Beaver blade was then used to tenotomized the FDL and FDB tendon of the right second toe.  Sterile 18-gauge needle was then used to confirm that all fibers of the flexor tendons of the second toe were satisfactorily released.  Good resting alignment of the second toe was noted sitting in improved rectus position.  The skin incision was then closed using 4-0 Ethilon suture using simple interrupted technique.  The skin incision was closed under minimal tension.  The ulceration distal right second toe was inspected, it was lightly debrided of overlying slough and nonviable tissue using #15 blade without incident.  Xeroform was applied to the ulcer site and to the incision site.  Dressing was applied consisting of 4 x 4 gauze, Kerlix, Ace wrap.  The patient tolerated the procedure well.  He is instructed to maintain the bandage and leave clean dry and intact.  He may ambulate to tolerance and surgical shoe.  Extra strength Tylenol  1000 mg every 8 hours as needed for mild to moderate pain, 5 mg oxycodone  every 6 hours as needed for severe pain sent to patient's pharmacy.  3 days of Keflex 3 times daily 500 mg for infection prophylaxis was sent to patient's pharmacy.  He will follow-up in 1 week for postoperative check.    Bridey Brookover L. Saddler, DPM

## 2024-03-28 NOTE — Patient Instructions (Signed)
 After Surgery Instructions   1) If you are recuperating from surgery anywhere other than home, please be sure to leave us  the number where you can be reached.  2) Go directly home and rest.  3) Keep the operated foot(feet) elevated six inches above the hip when sitting or lying down. This will help control swelling and pain.  4) Support the elevated foot and leg with pillows. DO NOT PLACE PILLOWS UNDER THE KNEE.  5) DO NOT REMOVE or get your bandages WET, unless you were given different instructions by your doctor to do so. This increases the risk of infection.  6) Wear your surgical shoe or surgical boot at all times when you are up on your feet.  7) A limited amount of pain and swelling may occur. The skin may take on a bruised appearance. DO NOT BE ALARMED, THIS IS NORMAL.  8) For slight pain and swelling, apply an ice pack directly over the bandages for 15 minutes only out of each hour of the day. Continue until seen in the office for your first post op visit. DON NOT     APPLY ANY FORM OF HEAT TO THE AREA.  9) Have prescriptions filled immediately and take as directed.  10) Drink lots of liquids, water and juice to stay hydrated.  11) CALL IMMEDIATELY IF:  *Bleeding continues until the following day of surgery  *Pain increases and/or does not respond to medication  *Bandages or cast appears to tight  *If your bandage gets wet  *Trip, fall or stump your surgical foot  *If your temperature goes above 101  *If you have ANY questions at all  YOU NOW CONTROL THE EFFORT OF YOUR RECOVERY. ADHERING TO THESE INSTRUCTIONS WILL OFFER YOU THE MOST COMPLETE RESULTS

## 2024-03-29 ENCOUNTER — Ambulatory Visit: Admitting: Podiatry

## 2024-03-29 ENCOUNTER — Encounter: Payer: Self-pay | Admitting: Podiatry

## 2024-03-30 ENCOUNTER — Other Ambulatory Visit (HOSPITAL_COMMUNITY): Payer: Self-pay

## 2024-04-01 ENCOUNTER — Other Ambulatory Visit: Payer: Self-pay

## 2024-04-01 ENCOUNTER — Other Ambulatory Visit (HOSPITAL_COMMUNITY): Payer: Self-pay

## 2024-04-01 ENCOUNTER — Telehealth: Payer: Self-pay

## 2024-04-01 NOTE — Telephone Encounter (Signed)
 Prior auth faxed 11/3

## 2024-04-04 ENCOUNTER — Encounter: Payer: Self-pay | Admitting: Podiatry

## 2024-04-04 ENCOUNTER — Ambulatory Visit (INDEPENDENT_AMBULATORY_CARE_PROVIDER_SITE_OTHER): Admitting: Podiatry

## 2024-04-04 DIAGNOSIS — M2042 Other hammer toe(s) (acquired), left foot: Secondary | ICD-10-CM

## 2024-04-04 DIAGNOSIS — L97512 Non-pressure chronic ulcer of other part of right foot with fat layer exposed: Secondary | ICD-10-CM

## 2024-04-04 DIAGNOSIS — M2041 Other hammer toe(s) (acquired), right foot: Secondary | ICD-10-CM

## 2024-04-04 DIAGNOSIS — E1142 Type 2 diabetes mellitus with diabetic polyneuropathy: Secondary | ICD-10-CM

## 2024-04-04 NOTE — Progress Notes (Signed)
 Subjective:  Patient ID: [name] Ronette Raddle., male    DOB: [birth year],  MRN: [id number]  Chief Complaint  Patient presents with   Routine Post Op    POV#1 Flexor Tenotomy with Dr. Lamount. Pt having some pain on R lateral from PO shoe     DOS: 03/28/2024 Procedure: Right second toe open flexor tenotomy  63 y.o. male returns for post-op check.  He is accompanied by daughter.  Doing well.  Denies any pain to the toe.  He does report some pain about the fifth metatarsal head associated with postop shoe.  He is diabetic.  Review of Systems: Negative except as noted in the HPI. Denies N/V/F/Ch.  Past Medical History:  Diagnosis Date   Angio-edema    Anxiety    Claudication in peripheral vascular disease 05/07/2022   Depression    Diabetes mellitus type II    Diabetic polyneuropathy associated with type 2 diabetes mellitus (HCC) 06/30/2018   GERD (gastroesophageal reflux disease)    History of kidney stones    HLD (hyperlipidemia)    HTN (hypertension)    Morbid obesity (HCC)    PAF (paroxysmal atrial fibrillation) (HCC) 08/31/2020   Paroxysmal atrial fibrillation (HCC)    Restless legs syndrome (RLS) 11/20/2012   Stroke (HCC) 02/14/2020   ischemic/right sided affected   Tobacco abuse     Current Outpatient Medications:    acetaminophen  (TYLENOL ) 500 MG tablet, Take 2 tablets (1,000 mg total) by mouth 2 (two) times daily as needed (for pain)., Disp: 120 tablet, Rfl: 3   Alcohol  Swabs  (B-D SINGLE USE SWABS  REGULAR) PADS, Use to check blood sugar daily, Disp: 100 each, Rfl: 3   Blood Glucose Monitoring Suppl (BLOOD GLUCOSE MONITOR SYSTEM) w/Device KIT, Use as directed once daily., Disp: 1 kit, Rfl: 0   buPROPion  (WELLBUTRIN  XL) 150 MG 24 hr tablet, Take 1 tablet (150 mg total) by mouth daily., Disp: 90 tablet, Rfl: 1   cetirizine (ZYRTEC) 10 MG tablet, Take 10 mg by mouth daily., Disp: , Rfl:    clonazePAM  (KLONOPIN ) 0.5 MG tablet, Take 1 tablet (0.5 mg total) by mouth 2 (two)  times daily., Disp: 60 tablet, Rfl: 2   diltiazem  (CARDIZEM ) 30 MG tablet, TAKE 1 TABLET BY MOUTH EVERY 4 HOURS AS NEEDED FOR HEART RATE>100., Disp: 90 tablet, Rfl: 0   Dulaglutide  (TRULICITY ) 4.5 MG/0.5ML SOAJ, Inject 4.5 mg into the skin once a week., Disp: 6 mL, Rfl: 3   DULoxetine  (CYMBALTA ) 30 MG capsule, Take 2 capsules (60 mg total) by mouth daily AND 1 capsule (30 mg total) at bedtime., Disp: 90 capsule, Rfl: 0   DULoxetine  (CYMBALTA ) 60 MG capsule, Take 1 capsule (60 mg total) by mouth 2 (two) times daily., Disp: 180 capsule, Rfl: 1   empagliflozin  (JARDIANCE ) 25 MG TABS tablet, Take 1 tablet (25 mg total) by mouth daily before breakfast., Disp: 90 tablet, Rfl: 3   EPINEPHrine  0.3 mg/0.3 mL IJ SOAJ injection, Inject 0.3 mg into the muscle as needed for anaphylaxis., Disp: 2 each, Rfl: 0   FLUoxetine  (PROZAC ) 40 MG capsule, Take 2 capsules (80 mg total) by mouth daily., Disp: 180 capsule, Rfl: 0   Glucose Blood (BLOOD GLUCOSE TEST STRIPS) STRP, Use as directed once daily., Disp: 100 each, Rfl: 3   hydrochlorothiazide  (HYDRODIURIL ) 25 MG tablet, Take 1 tablet (25 mg total) by mouth daily., Disp: 90 tablet, Rfl: 3   Lancet Device MISC, Use as directed in the morning, at noon, and at bedtime., Disp: 1 each,  Rfl: 0   Lancets MISC, Use as directed in the morning, at noon, and at bedtime., Disp: 300 each, Rfl: 3   lisinopril  (ZESTRIL ) 10 MG tablet, Take 1 tablet (10 mg total) by mouth daily.Please contact office for future appointment and refills, Thank you [phone], Disp: 30 tablet, Rfl: 0   metFORMIN  (GLUCOPHAGE -XR) 500 MG 24 hr tablet, Take 4 tablets (2,000 mg total) by mouth daily in the afternoon., Disp: 360 tablet, Rfl: 3   Multiple Vitamin (MULTIVITAMIN) tablet, Take 1 tablet by mouth daily., Disp: 100 tablet, Rfl: 3   mupirocin  ointment (BACTROBAN ) 2 %, Apply 1 Application topically 2 (two) times daily., Disp: 22 g, Rfl: 2   oxyCODONE  (OXY IR/ROXICODONE ) 5 MG immediate release tablet,  Take 1 tablet (5 mg total) by mouth every 4 (four) hours as needed for up to 7 days for severe pain (pain score 7-10)., Disp: 20 tablet, Rfl: 0   pantoprazole  (PROTONIX ) 40 MG tablet, Take 1 tablet (40 mg total) by mouth daily., Disp: 90 tablet, Rfl: 3   pentoxifylline  (TRENTAL ) 400 MG CR tablet, Take 1 tablet (400 mg total) by mouth 3 (three) times daily with meals., Disp: 270 tablet, Rfl: 3   pregabalin  (LYRICA ) 300 MG capsule, TAKE 1 CAPSULE(300 MG) BY MOUTH TWICE DAILY, Disp: 180 capsule, Rfl: 1   propranolol (INDERAL) 20 MG tablet, Take 1 tablet (20 mg total) by mouth 2 (two) times daily as needed (tremor)., Disp: 60 tablet, Rfl: 2   repaglinide  (PRANDIN ) 1 MG tablet, Take 1 tablet (1 mg total) by mouth daily before supper., Disp: 90 tablet, Rfl: 3   rosuvastatin  (CRESTOR ) 20 MG tablet, Take 1 tablet (20 mg total) by mouth at bedtime., Disp: 90 tablet, Rfl: 3   STUDY - LIBREXIA-AF - apixaban  5 mg or placebo capsule (PI-Sethi), Take 1 capsule (5 mg total) by mouth 2 (two) times daily. Take at approximately the same time of day with or without food. Please bring bottle back with you to every visit; do not discard bottle. Please contact [facility] for any questions or concerns regarding this medication. For Investigational Use Only., Disp: 420 capsule, Rfl: 0   STUDY - LIBREXIA-AF - [id number] (Milvexian) 100 mg or placebo tablet (PI-Sethi), Take 1 tablet by mouth 2 (two) times daily. Take at approximately the same time of day with or without food. Please bring bottle back with you to every visit; do not discard bottle. Please contact [facility] for any questions or concerns regarding this medication. 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   Pistachio Nut (Diagnostic) Hives, Shortness Of Breath, Itching, Swelling and Rash   Latex Dermatitis   Tape Other (See Comments)    Tears and bruises the skin   Betadine [Povidone Iodine] Rash and Other (See Comments)    Raised rash 04/23/14   Lidocaine  Other (See Comments)    Minimal effect with  lidocaine , prefers different anesthetic   Objective:  There were no vitals filed for this visit. There is no height or weight on file to calculate BMI. Constitutional Well developed. Well nourished.  Vascular Foot warm and well perfused. Capillary refill normal to all digits.  Faintly palpable DP and PT pulses.  Capillary refill intact to the digits.  Neurologic Normal speech. Oriented to person, place, and time. Light touch sensation decreased to toes.  Protective sensation decreased.  Dermatologic Ulceration distal right second toe nearly healed, partial-thickness at this point with dermal tissue involvement, 0.4 cm in diameter.  Suture intact to plantar right second toe at the sulcus level with skin edges well-approximated well coapted.  Appears to be healing well.  Right lateral fifth met head darkened skin changes without underlying fluctuance, crepitation, erythema, focal warmth increased.  Concerning for pressure injury.  Orthopedic: Minimal tenderness to palpation noted about the surgical site.  Second toe sitting in improved rectus position.  Tenderness on palpation of right lateral fifth metatarsal head.   Assessment:   1. Skin ulcer of second toe of right foot with fat layer exposed (HCC)   2. Hammertoe, bilateral   3. Diabetic polyneuropathy associated with type 2 diabetes  mellitus (HCC)    Plan:  Patient was evaluated and treated and all questions answered.  S/p foot surgery right second toe open flexor tenotomy to offload distal second toe ulceration -Progressing as expected post-operatively. -WB Status: Weightbearing as tolerated. Return to regular shoes once suture is removed. -Sutures: Left intact, may be able to remove at next visit. -Pain well-controlled. -Foot redressed with Xeroform and DSD dressing to the incision site.  Okay to change at home as needed, may apply Band-Aid as appropriate as well. -Offloading felt padding applied to right lateral fifth metatarsal head.  Monitor for signs of wound formation or tissue breakdown.  Continue to offload.  May modify surgical shoe as appropriate 2.  # Right second toe ulceration distal aspect - Did not require significant excisional debridement today.  Did debride overlying callus with #15 blade without incident - Mupirocin  and Band-Aid applied. - Daily dressing changes with this however this does appear nearly healed. Monitor for signs of recurrence   Return in about 1 week (around 04/11/2024) for Post Op Suture Removal.

## 2024-04-09 ENCOUNTER — Ambulatory Visit

## 2024-04-09 DIAGNOSIS — E1142 Type 2 diabetes mellitus with diabetic polyneuropathy: Secondary | ICD-10-CM

## 2024-04-09 DIAGNOSIS — M2042 Other hammer toe(s) (acquired), left foot: Secondary | ICD-10-CM

## 2024-04-09 DIAGNOSIS — M2041 Other hammer toe(s) (acquired), right foot: Secondary | ICD-10-CM

## 2024-04-09 DIAGNOSIS — L97512 Non-pressure chronic ulcer of other part of right foot with fat layer exposed: Secondary | ICD-10-CM

## 2024-04-09 NOTE — Progress Notes (Signed)
 Subjective:  Patient ID: [name] [name]., male    DOB: 23-Sep-1960,  MRN: [id number]  Chief Complaint  Patient presents with   Routine Post Op    Rm6 Follow up tenotomy right 2nd toe/ some tenderness when walking and he is wearing surgical shoe.    DOS: 03/28/2024 Procedure: Right second toe open flexor tenotomy  63 y.o. male returns for post-op check.  He is accompanied by daughter and wife.  Doing well.  Denies any pain to the toe.  Has been compliant with WB restrictions. He is diabetic.  Review of Systems: Negative except as noted in the HPI. Denies N/V/F/Ch.  Past Medical History:  Diagnosis Date   Angio-edema    Anxiety    Claudication in peripheral vascular disease 05/07/2022   Depression    Diabetes mellitus type II    Diabetic polyneuropathy associated with type 2 diabetes mellitus (HCC) 06/30/2018   GERD (gastroesophageal reflux disease)    History of kidney stones    HLD (hyperlipidemia)    HTN (hypertension)    Morbid obesity (HCC)    PAF (paroxysmal atrial fibrillation) (HCC) 08/31/2020   Paroxysmal atrial fibrillation (HCC)    Restless legs syndrome (RLS) 11/20/2012   Stroke (HCC) 02/14/2020   ischemic/right sided affected   Tobacco abuse     Current Outpatient Medications:    acetaminophen  (TYLENOL ) 500 MG tablet, Take 2 tablets (1,000 mg total) by mouth 2 (two) times daily as needed (for pain)., Disp: 120 tablet, Rfl: 3   Alcohol  Swabs  (B-D SINGLE USE SWABS  REGULAR) PADS, Use to check blood sugar daily, Disp: 100 each, Rfl: 3   Blood Glucose Monitoring Suppl (BLOOD GLUCOSE MONITOR SYSTEM) w/Device KIT, Use as directed once daily., Disp: 1 kit, Rfl: 0   buPROPion  (WELLBUTRIN  XL) 150 MG 24 hr tablet, Take 1 tablet (150 mg total) by mouth daily., Disp: 90 tablet, Rfl: 1   cetirizine (ZYRTEC) 10 MG tablet, Take 10 mg by mouth daily., Disp: , Rfl:    clonazePAM  (KLONOPIN ) 0.5 MG tablet, Take 1 tablet (0.5 mg total) by mouth 2 (two) times daily., Disp: 60  tablet, Rfl: 2   diltiazem  (CARDIZEM ) 30 MG tablet, TAKE 1 TABLET BY MOUTH EVERY 4 HOURS AS NEEDED FOR HEART RATE>100., Disp: 90 tablet, Rfl: 0   Dulaglutide  (TRULICITY ) 4.5 MG/0.5ML SOAJ, Inject 4.5 mg into the skin once a week., Disp: 6 mL, Rfl: 3   DULoxetine  (CYMBALTA ) 30 MG capsule, Take 2 capsules (60 mg total) by mouth daily AND 1 capsule (30 mg total) at bedtime., Disp: 90 capsule, Rfl: 0   DULoxetine  (CYMBALTA ) 60 MG capsule, Take 1 capsule (60 mg total) by mouth 2 (two) times daily., Disp: 180 capsule, Rfl: 1   empagliflozin  (JARDIANCE ) 25 MG TABS tablet, Take 1 tablet (25 mg total) by mouth daily before breakfast., Disp: 90 tablet, Rfl: 3   EPINEPHrine  0.3 mg/0.3 mL IJ SOAJ injection, Inject 0.3 mg into the muscle as needed for anaphylaxis., Disp: 2 each, Rfl: 0   FLUoxetine  (PROZAC ) 40 MG capsule, Take 2 capsules (80 mg total) by mouth daily., Disp: 180 capsule, Rfl: 0   Glucose Blood (BLOOD GLUCOSE TEST STRIPS) STRP, Use as directed once daily., Disp: 100 each, Rfl: 3   hydrochlorothiazide  (HYDRODIURIL ) 25 MG tablet, Take 1 tablet (25 mg total) by mouth daily., Disp: 90 tablet, Rfl: 3   Lancet Device MISC, Use as directed in the morning, at noon, and at bedtime., Disp: 1 each, Rfl: 0   Lancets MISC, Use  as directed in the morning, at noon, and at bedtime., Disp: 300 each, Rfl: 3   lisinopril  (ZESTRIL ) 10 MG tablet, Take 1 tablet (10 mg total) by mouth daily.Please contact office for future appointment and refills, Thank you [phone], Disp: 30 tablet, Rfl: 0   metFORMIN  (GLUCOPHAGE -XR) 500 MG 24 hr tablet, Take 4 tablets (2,000 mg total) by mouth daily in the afternoon., Disp: 360 tablet, Rfl: 3   Multiple Vitamin (MULTIVITAMIN) tablet, Take 1 tablet by mouth daily., Disp: 100 tablet, Rfl: 3   mupirocin  ointment (BACTROBAN ) 2 %, Apply 1 Application topically 2 (two) times daily., Disp: 22 g, Rfl: 2   pantoprazole  (PROTONIX ) 40 MG tablet, Take 1 tablet (40 mg total) by mouth daily.,  Disp: 90 tablet, Rfl: 3   pentoxifylline  (TRENTAL ) 400 MG CR tablet, Take 1 tablet (400 mg total) by mouth 3 (three) times daily with meals., Disp: 270 tablet, Rfl: 3   pregabalin  (LYRICA ) 300 MG capsule, TAKE 1 CAPSULE(300 MG) BY MOUTH TWICE DAILY, Disp: 180 capsule, Rfl: 1   propranolol (INDERAL) 20 MG tablet, Take 1 tablet (20 mg total) by mouth 2 (two) times daily as needed (tremor)., Disp: 60 tablet, Rfl: 2   repaglinide  (PRANDIN ) 1 MG tablet, Take 1 tablet (1 mg total) by mouth daily before supper., Disp: 90 tablet, Rfl: 3   rosuvastatin  (CRESTOR ) 20 MG tablet, Take 1 tablet (20 mg total) by mouth at bedtime., Disp: 90 tablet, Rfl: 3   STUDY - LIBREXIA-AF - apixaban  5 mg or placebo capsule (PI-Sethi), Take 1 capsule (5 mg total) by mouth 2 (two) times daily. Take at approximately the same time of day with or without food. Please bring bottle back with you to every visit; do not discard bottle. Please contact [facility] for any questions or concerns regarding this medication. For Investigational Use Only., Disp: 420 capsule, Rfl: 0   STUDY - LIBREXIA-AF - [id number] (Milvexian) 100 mg or placebo tablet (PI-Sethi), Take 1 tablet by mouth 2 (two) times daily. Take at approximately the same time of day with or without food. Please bring bottle back with you to every visit; do not discard bottle. Please contact [facility] for any questions or concerns regarding this medication. 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   Pistachio Nut (Diagnostic) Hives, Shortness Of Breath, Itching, Swelling and Rash   Latex Dermatitis   Tape Other (See Comments)    Tears and bruises the skin   Betadine [Povidone Iodine] Rash and Other (See Comments)    Raised rash 04/23/14   Lidocaine  Other (See Comments)    Minimal effect with lidocaine , prefers different anesthetic   Objective:  There were no vitals filed for this visit. There is no height or weight on file to calculate BMI. Constitutional Well developed. Well nourished.  Vascular Foot warm and well perfused. Capillary refill normal to all digits.  Faintly  palpable DP and PT pulses.  Capillary refill intact to the digits.  Neurologic Normal speech. Oriented to person, place, and time. Light touch sensation decreased to toes.  Protective sensation decreased.  Dermatologic Ulceration distal right second toe nearly healed with partial thickness dermal tissue involvement, 0.4 cm in diameter.  Suture intact to plantar right second toe at the sulcus level with skin edges well-approximated well coapted.  Suture removed without incident.   Right lateral fifth met head darkened skin changes without underlying fluctuance, crepitation, erythema, focal warmth increased.  Concerning for pressure injury. Improved compared to alst visit  Orthopedic: Minimal tenderness to palpation noted about the surgical site.  Second toe sitting in improved rectus position.  Tenderness on palpation of right lateral fifth metatarsal head.   Assessment:   1. Skin ulcer of second toe of right foot with fat layer exposed (HCC)   2. Hammertoe, bilateral   3. Diabetic polyneuropathy associated with type 2 diabetes mellitus (HCC)     Plan:  Patient was evaluated and treated and all questions answered.  S/p foot surgery right second toe open flexor tenotomy to offload distal second toe ulceration -Progressing  as expected post-operatively. -WB Status: Weightbearing as tolerated. Return to regular shoegear in 1 week. -Sutures: removed today without incident. No incisional gapping. Okay to shower starting tomorrow -Pain well-controlled. -Foot redressed with telfa, DSD -Offloading felt padding applied to right lateral fifth metatarsal head.  Monitor for signs of wound formation or tissue breakdown.  Continue to offload.  May modify surgical shoe as appropriate 2.  # Right second toe ulceration distal aspect - Did not require debridement today.  Healed with fresh epithelial skin. No signs of infection - Monitor for signs of recurrence   Return in about 3 weeks (around 04/30/2024).

## 2024-04-10 ENCOUNTER — Other Ambulatory Visit (HOSPITAL_COMMUNITY): Payer: Self-pay

## 2024-04-11 ENCOUNTER — Other Ambulatory Visit (HOSPITAL_COMMUNITY): Payer: Self-pay

## 2024-04-11 ENCOUNTER — Other Ambulatory Visit: Payer: Self-pay

## 2024-04-11 ENCOUNTER — Telehealth: Payer: Self-pay

## 2024-04-11 NOTE — Telephone Encounter (Signed)
 Called patient to get him scheduled to come pick up his Diabetic Shoes. Unable to connect with patient, Left VM to have patient call us  back and schedule PU appointment to pick up his shoes

## 2024-04-11 NOTE — Telephone Encounter (Signed)
 HTA  PA rcvd and approved  Auth #[id number]  04/01/2024-06/30/2024  For [id number] and [phone]

## 2024-04-16 ENCOUNTER — Ambulatory Visit: Admitting: Podiatry

## 2024-04-16 ENCOUNTER — Other Ambulatory Visit: Payer: Self-pay

## 2024-04-16 ENCOUNTER — Other Ambulatory Visit (HOSPITAL_COMMUNITY): Payer: Self-pay

## 2024-04-28 ENCOUNTER — Other Ambulatory Visit (HOSPITAL_COMMUNITY): Payer: Self-pay

## 2024-05-02 ENCOUNTER — Encounter: Admitting: Podiatry

## 2024-05-03 ENCOUNTER — Ambulatory Visit

## 2024-05-03 ENCOUNTER — Ambulatory Visit: Admitting: Podiatry

## 2024-05-03 DIAGNOSIS — E11610 Type 2 diabetes mellitus with diabetic neuropathic arthropathy: Secondary | ICD-10-CM | POA: Diagnosis not present

## 2024-05-03 DIAGNOSIS — L97512 Non-pressure chronic ulcer of other part of right foot with fat layer exposed: Secondary | ICD-10-CM

## 2024-05-03 DIAGNOSIS — M2041 Other hammer toe(s) (acquired), right foot: Secondary | ICD-10-CM

## 2024-05-03 NOTE — Patient Instructions (Signed)
Instructions for Wound Care  The most important step to healing a foot wound is to reduce the pressure on your foot - it is extremely important to stay off your foot as much as possible and wear the shoe/boot as instructed.  Cleanse your foot with saline wash or warm soapy water (dial antibacterial soap or similar).  Blot dry.  Apply prescribed medication to your wound and cover with gauze and a bandage.  May hold bandage in place with Coban (self sticky wrap), Ace bandage or tape.  You may find dressing supplies at your local [employer], [employer], drug store or medical supply store.  Monitor for any signs/symptoms of infection. If there is any increase in redness, red streaks, increase in drainage, warmth to your foot please give us a call. Also, if you start to run a fever or have "flu-like" symptoms that can also be a sign of infection. Call the office immediately if any occur or go directly to the emergency room.   If you have any questions, please feel free to give us a call at [phone] or if you are on "MyChart" you can always send me a message if needed.    

## 2024-05-03 NOTE — Progress Notes (Signed)
 Chief Complaint  Patient presents with   Foot Ulcer    Ulcer R foot 2nd toe. Is not able to bend 2nd toe. Closed, no dried blood seen. Post op shoe rubbed a spot R 5th Met. Diabetic  A1c 6.4, 01/05/24.   DOS: 03/28/2024 Procedure: Right second toe flexor tenotomy  HPI: 63 y.o. male presenting today for follow-up evaluation of right second toe ulcer.  Also has new pressure ulcer right lateral fifth metatarsal head likely from surgical shoe.  He comes in wearing regular shoes today.  Denies any pain from flexor tenotomy procedure, does have decreased mobility of the toe.  Regarding the new ulceration they have been applying bandages daily and keeping area covered.  Denies signs of infection.  Past Medical History:  Diagnosis Date   Angio-edema    Anxiety    Claudication in peripheral vascular disease 05/07/2022   Depression    Diabetes mellitus type II    Diabetic polyneuropathy associated with type 2 diabetes mellitus (HCC) 06/30/2018   GERD (gastroesophageal reflux disease)    History of kidney stones    HLD (hyperlipidemia)    HTN (hypertension)    Morbid obesity (HCC)    PAF (paroxysmal atrial fibrillation) (HCC) 08/31/2020   Paroxysmal atrial fibrillation (HCC)    Restless legs syndrome (RLS) 11/20/2012   Stroke (HCC) 02/14/2020   ischemic/right sided affected   Tobacco abuse    Past Surgical History:  Procedure Laterality Date   Arm surgery Right 1969   Abscess excision and debridement at elbow   COLONOSCOPY WITH PROPOFOL  N/A 09/06/2016   Procedure: COLONOSCOPY WITH PROPOFOL ;  Surgeon: Victory LITTIE Legrand DOUGLAS, MD;  Location: [facility];  Service: Gastroenterology;  Laterality: N/A;   I & D EXTREMITY Left 05/29/2014   Procedure: IRRIGATION AND DEBRIDEMENT OF LEFT LEG WOUND AND PLACEMENT OF  INTEGRA  AND  VAC;  Surgeon: Estefana Reichert, DO;  Location: [facility];  Service: Plastics;  Laterality: Left;   implantable loop recorder implant  03/06/2020   Medtronic  Reveal [place] model LNQ11 (SN  [id number]) implanted by Dr Kelsie for cryptogenic stroke   INCISION AND DRAINAGE OF WOUND Left 04/23/2014   Procedure: IRRIGATION AND DEBRIDEMENT LOWER LEFT LEG WOUND WITH PLACEMENT OF INTEGRA AND VAC;  Surgeon: Estefana Reichert, DO;  Location: [facility];  Service: Plastics;  Laterality: Left;   kidney stone removal  2004   MINOR APPLICATION OF WOUND VAC Left 04/23/2014   Procedure: MINOR APPLICATION OF WOUND VAC;  Surgeon: Estefana Reichert, DO;  Location: [facility];  Service: Plastics;  Laterality: Left;   SKIN SPLIT GRAFT Left 06/18/2014   Procedure: SKIN GRAFT SPLIT THICKNESS TO LOWER LEFT LEG WOUND WITH PLACEMENT OF VAC;  Surgeon: Estefana Reichert, DO;  Location: [facility];  Service: Plastics;  Laterality: Left;   VASECTOMY  1994   Allergies  Allergen Reactions   Cashew Nut Oil Anaphylaxis    Cashews, 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   Pistachio Nut (Diagnostic) Hives, Shortness Of Breath, Itching, Swelling and Rash   Latex Dermatitis   Tape Other (See Comments)    Tears and bruises the skin   Betadine [Povidone Iodine] Rash and Other (See Comments)    Raised rash 04/23/14   Lidocaine  Other (See Comments)    Minimal effect with lidocaine , prefers different anesthetic      PHYSICAL EXAM: There were no vitals filed for this visit.  General: The patient is alert and oriented x3 in no acute distress.  Dermatology: Skin is warm, dry and supple bilateral lower extremities. Interspaces are clear of maceration and debris.  Prior ulceration distal right second toe well-healed.  Flexor tenotomy site well-healed.    Wound 1:  Location: Right lateral fifth metatarsal head        Depth: Subcutaneous tissue, overlying eschar        Wound Border: Regular wound borders without epiboly        Wound Base: Following debridement granulation tissue noted        Drainage: Minimal       Odor?:  No        Surrounding Tissue:  Nonerythematous        Infected?:  No        Necrosis?:  Necrosis of skin subcutaneous tissue        Pain?:  Yes        Tunneling: No       Dimensions (cm): Predebridement measurements 1.6 x 1.4 without significant depth due to overlying necrotic eschar.  Postdebridement measurements 1.7 x 1.5 x 0.2 cm with subcutaneous tissue exposed.    Vascular: Faintly palpable DP and PT pulses.  Cap refill intact to the digits.  Foot was warm and well-perfused.  No erythema or edema noted.  Neurological: Light touch sensation decreased to toes.  Protective sensation decreased.  Musculoskeletal Exam: Right second toe improved rectus position.  Decreased active range of motion in plantarflexion.  No tenderness on palpation to the area.     Latest Ref Rng & Units 01/05/2024   11:48 AM 07/21/2023    8:42 AM 07/07/2023    9:37 AM  Hemoglobin A1C  Hemoglobin-A1c 4.0 - 5.6 % 6.4  6.6  6.8      RADIOGRAPHIC EXAM:  Normal osseous mineralization.   ASSESSMENT / PLAN OF CARE: 1. Ulcer of right foot with fat layer exposed (HCC)   2. Hammertoe, bilateral      No orders of the defined types were placed in this encounter.  None   # Right lateral fifth metatarsal head ulceration with fat layer exposed The ulceration was sharply debrided of hyperkeratotic and devitalized soft tissue with sterile # 15 blade to the level of subcutaneous tissue.  1 to 2 cc of blood loss noted.  Hemostasis obtained with silver nitrate and compression.  Antibiotic ointment and DSD applied.  Reviewed off-loading with patient.  Reviewed daily dressing changes with patient.  Reviewed offloading with patient.  Will continue to need debridements every 2 to 3 weeks to promote wound healing.  # Right second toe hammertoe with prior ulceration Well-healed and doing well with this. Date of procedure 03/28/2024  Return in about 2 weeks (around 05/17/2024) for Wound Care.   Verbena Boeding L. Lamount MAUL, AACFAS [facility]      [address] [place], [place] [address]                Office [phone]  Fax [phone]

## 2024-05-03 NOTE — Progress Notes (Signed)
   The patient presented to the office to day to pick up diabetic shoes and 3 pr diabetic custom inserts.  1 pr of  inserts were put in the shoes and the shoes were fitted to the patient.   The foot ortheses offered full contact with plantar surface and contoured the arch well.   The shoes fit well with no heel slippage or areas of pressure concern.   Instructions for break in and wear were dispensed as well as instructions for changing out the diabetic insoles every 4 months.  The  delivery documentation and break in instruction forms were signed and a copy of the paperwork was given to the patient.    Patient advised to contact us if any problems arise.  Patient also advised on how to report any issues  

## 2024-05-13 ENCOUNTER — Other Ambulatory Visit (HOSPITAL_COMMUNITY): Payer: Self-pay

## 2024-05-16 ENCOUNTER — Ambulatory Visit: Admitting: Podiatry

## 2024-05-20 ENCOUNTER — Telehealth: Payer: Self-pay | Admitting: Neurology

## 2024-05-20 NOTE — Telephone Encounter (Signed)
 Wife has r/s for pt, he has flu, pt on wait list

## 2024-05-21 ENCOUNTER — Ambulatory Visit

## 2024-05-22 ENCOUNTER — Ambulatory Visit: Admitting: Adult Health

## 2024-05-30 ENCOUNTER — Encounter: Payer: Self-pay | Admitting: Family Medicine

## 2024-06-04 ENCOUNTER — Ambulatory Visit: Admitting: Podiatry

## 2024-06-10 ENCOUNTER — Ambulatory Visit: Admitting: Podiatry

## 2024-06-10 ENCOUNTER — Telehealth: Payer: Self-pay | Admitting: Neurology

## 2024-06-10 DIAGNOSIS — M205X1 Other deformities of toe(s) (acquired), right foot: Secondary | ICD-10-CM

## 2024-06-10 DIAGNOSIS — E1142 Type 2 diabetes mellitus with diabetic polyneuropathy: Secondary | ICD-10-CM | POA: Diagnosis not present

## 2024-06-10 DIAGNOSIS — L97512 Non-pressure chronic ulcer of other part of right foot with fat layer exposed: Secondary | ICD-10-CM

## 2024-06-10 NOTE — Patient Instructions (Signed)
 More silicone pads can be purchased from:  [email or web address]

## 2024-06-10 NOTE — Telephone Encounter (Signed)
"   Request to reschedule appointment, conflict , pt on waitlist "

## 2024-06-10 NOTE — Progress Notes (Unsigned)
"  °  Subjective:  Patient ID: [name] [name]., male    DOB: [birth year],  MRN: [id number]  Chief Complaint  Patient presents with   Wound Check    Pt is here for a 2 week follow up on Ulcer of right foot, he stated that it is scabbed over and there has been no drainage.     Discussed the use of AI scribe software for clinical note transcription with the patient, who gave verbal consent to proceed.  History of Present Illness [name] [name]. [name] is a 64 year old male with type 2 diabetes who presents for follow-up of a chronic right lateral fifth metatarsal head ulcer.  He also underwent right second toe flexor tenotomy on 03/28/2024.  While in surgical shoe, he developed a pressure ulceration which has formed an eschar.  This has been slowly improving.  It remains stable.  He recently had an influenza-like illness with 26-pound weight loss and ongoing weakness. This has limited his activity but is gradually improving.  He has new irritation and erythema over the dorsal right hallux from shoe friction. He is not using a toe cap currently, though he has used similar devices on other toes in the past.        Objective:    Physical Exam Faintly palpable DP and PT pulses.  Baseline lower extremity edema present.  Cap refill intact to the unaffected digits. Decreased light touch and protective sensation Right second toe sitting in improved rectus position.  Flexor tenotomy site healed. Right fifth metatarsal head lateral aspect stable eschar formation 1.5 cm x 1.5 cm in diameter.  No signs of infection noted.       Results    Assessment:   1. Ulcer of right foot with fat layer exposed (HCC)   2. Acquired claw toe of right foot   3. Diabetic polyneuropathy associated with type 2 diabetes mellitus (HCC)      Plan:  Patient was evaluated and treated and all questions answered.  Assessment and Plan Assessment & Plan Status post right second toe flexor tenotomy on  03/28/2024, doing well for this  Following up for separate issue subsequent pressure ulceration right fifth metatarsal head lateral aspect.  Eschar formation noted.  Appears stable  Did advise that they continue to paints Betadine over this area and applied padded bandage to protect from friction and shoe gear Monitor for signs of developing infection and contact office or go to emergency room if this develops.  Will recheck in about 3 weeks.  If eschar showing signs of loosening or becoming unstable would proceed with sharp debridement.  Considerations for wound healing include diabetes with neuropathy, A-fib on anticoagulation therapy, PAD      Return in about 3 weeks (around 07/01/2024) for Ulcer Check.    "

## 2024-06-12 ENCOUNTER — Ambulatory Visit: Admitting: Adult Health

## 2024-06-12 ENCOUNTER — Other Ambulatory Visit: Payer: Self-pay

## 2024-06-13 ENCOUNTER — Encounter: Payer: Self-pay | Admitting: Podiatry

## 2024-06-17 ENCOUNTER — Other Ambulatory Visit (HOSPITAL_COMMUNITY): Payer: Self-pay

## 2024-06-18 ENCOUNTER — Other Ambulatory Visit: Payer: Self-pay

## 2024-06-18 ENCOUNTER — Other Ambulatory Visit (HOSPITAL_COMMUNITY): Payer: Self-pay

## 2024-06-20 ENCOUNTER — Other Ambulatory Visit: Payer: Self-pay

## 2024-06-26 ENCOUNTER — Other Ambulatory Visit: Payer: Self-pay | Admitting: Family Medicine

## 2024-06-26 ENCOUNTER — Other Ambulatory Visit: Payer: Self-pay | Admitting: Behavioral Health

## 2024-06-26 ENCOUNTER — Other Ambulatory Visit (HOSPITAL_COMMUNITY): Payer: Self-pay

## 2024-06-26 ENCOUNTER — Other Ambulatory Visit: Payer: Self-pay

## 2024-06-26 DIAGNOSIS — F411 Generalized anxiety disorder: Secondary | ICD-10-CM

## 2024-06-26 DIAGNOSIS — E1159 Type 2 diabetes mellitus with other circulatory complications: Secondary | ICD-10-CM

## 2024-06-26 DIAGNOSIS — F331 Major depressive disorder, recurrent, moderate: Secondary | ICD-10-CM

## 2024-06-26 MED ORDER — ACCU-CHEK GUIDE W/DEVICE KIT
PACK | 0 refills | Status: AC
  Filled 2024-06-26: qty 1, 90d supply, fill #0

## 2024-06-26 MED ORDER — LANCETS MISC
3 refills | Status: AC
  Filled 2024-06-26: qty 102, fill #0
  Filled 2024-06-27 – 2024-06-28 (×2): qty 102, 90d supply, fill #0
  Filled 2024-06-29 – 2024-06-30 (×2): qty 102, 102d supply, fill #0
  Filled 2024-07-01: qty 102, 100d supply, fill #0
  Filled 2024-07-02: qty 102, 90d supply, fill #0
  Filled 2024-07-03: qty 102, 102d supply, fill #0
  Filled 2024-07-04: qty 100, 100d supply, fill #0

## 2024-06-26 MED ORDER — ACCU-CHEK GUIDE TEST VI STRP
ORAL_STRIP | 3 refills | Status: AC
  Filled 2024-06-26: qty 100, 100d supply, fill #0

## 2024-06-26 MED ORDER — ACCU-CHEK FASTCLIX LANCET KIT
PACK | 0 refills | Status: AC
  Filled 2024-06-26: qty 1, fill #0
  Filled 2024-06-27: qty 1, 30d supply, fill #0

## 2024-06-26 NOTE — Addendum Note (Signed)
 Addended by: WENDELL ARLAND RAMAN on: 06/26/2024 02:30 PM   Modules accepted: Orders

## 2024-06-27 ENCOUNTER — Other Ambulatory Visit: Payer: Self-pay

## 2024-06-27 ENCOUNTER — Other Ambulatory Visit (HOSPITAL_COMMUNITY): Payer: Self-pay

## 2024-06-27 MED ORDER — FLUOXETINE HCL 40 MG PO CAPS
80.0000 mg | ORAL_CAPSULE | Freq: Every day | ORAL | 0 refills | Status: AC
  Filled 2024-06-27: qty 180, 90d supply, fill #0

## 2024-06-28 ENCOUNTER — Other Ambulatory Visit: Payer: Self-pay

## 2024-06-29 ENCOUNTER — Other Ambulatory Visit (HOSPITAL_COMMUNITY): Payer: Self-pay

## 2024-06-30 ENCOUNTER — Other Ambulatory Visit: Payer: Self-pay

## 2024-07-01 ENCOUNTER — Ambulatory Visit: Admitting: Podiatry

## 2024-07-01 ENCOUNTER — Other Ambulatory Visit (HOSPITAL_COMMUNITY): Payer: Self-pay

## 2024-07-02 ENCOUNTER — Other Ambulatory Visit (HOSPITAL_COMMUNITY): Payer: Self-pay

## 2024-07-02 ENCOUNTER — Other Ambulatory Visit: Payer: Self-pay

## 2024-07-03 ENCOUNTER — Other Ambulatory Visit: Payer: Self-pay

## 2024-07-03 ENCOUNTER — Other Ambulatory Visit (HOSPITAL_COMMUNITY): Payer: Self-pay

## 2024-07-04 ENCOUNTER — Other Ambulatory Visit: Payer: Self-pay

## 2024-07-04 ENCOUNTER — Ambulatory Visit: Admitting: Podiatry

## 2024-07-16 ENCOUNTER — Ambulatory Visit: Admitting: Podiatry

## 2024-07-29 ENCOUNTER — Ambulatory Visit: Admitting: Internal Medicine

## 2024-07-29 ENCOUNTER — Ambulatory Visit: Admitting: Podiatry

## 2024-08-13 ENCOUNTER — Telehealth: Admitting: Behavioral Health

## 2024-08-21 ENCOUNTER — Ambulatory Visit: Admitting: Adult Health

## 2024-09-05 ENCOUNTER — Other Ambulatory Visit

## 2024-09-12 ENCOUNTER — Encounter: Admitting: Family Medicine

## 2024-10-11 ENCOUNTER — Ambulatory Visit: Admitting: Adult Health
